# Patient Record
Sex: Female | Born: 1940
Health system: Southern US, Community
[De-identification: ages and names within clinical notes are randomized; demographics above are authoritative.]

## PROBLEM LIST (undated history)

## (undated) DIAGNOSIS — G629 Polyneuropathy, unspecified: Secondary | ICD-10-CM

## (undated) DIAGNOSIS — M1990 Unspecified osteoarthritis, unspecified site: Secondary | ICD-10-CM

## (undated) DIAGNOSIS — N189 Chronic kidney disease, unspecified: Secondary | ICD-10-CM

## (undated) DIAGNOSIS — R112 Nausea with vomiting, unspecified: Secondary | ICD-10-CM

## (undated) DIAGNOSIS — Z9889 Other specified postprocedural states: Secondary | ICD-10-CM

## (undated) DIAGNOSIS — A770 Spotted fever due to Rickettsia rickettsii: Secondary | ICD-10-CM

## (undated) DIAGNOSIS — R3129 Other microscopic hematuria: Secondary | ICD-10-CM

## (undated) DIAGNOSIS — E785 Hyperlipidemia, unspecified: Secondary | ICD-10-CM

## (undated) DIAGNOSIS — I6529 Occlusion and stenosis of unspecified carotid artery: Secondary | ICD-10-CM

## (undated) DIAGNOSIS — C4491 Basal cell carcinoma of skin, unspecified: Secondary | ICD-10-CM

## (undated) DIAGNOSIS — F419 Anxiety disorder, unspecified: Secondary | ICD-10-CM

## (undated) DIAGNOSIS — J189 Pneumonia, unspecified organism: Secondary | ICD-10-CM

## (undated) DIAGNOSIS — R0600 Dyspnea, unspecified: Secondary | ICD-10-CM

## (undated) DIAGNOSIS — K5730 Diverticulosis of large intestine without perforation or abscess without bleeding: Secondary | ICD-10-CM

## (undated) DIAGNOSIS — I2699 Other pulmonary embolism without acute cor pulmonale: Secondary | ICD-10-CM

## (undated) DIAGNOSIS — R011 Cardiac murmur, unspecified: Secondary | ICD-10-CM

## (undated) DIAGNOSIS — B301 Conjunctivitis due to adenovirus: Secondary | ICD-10-CM

## (undated) DIAGNOSIS — N816 Rectocele: Secondary | ICD-10-CM

## (undated) DIAGNOSIS — I10 Essential (primary) hypertension: Secondary | ICD-10-CM

## (undated) DIAGNOSIS — I739 Peripheral vascular disease, unspecified: Secondary | ICD-10-CM

## (undated) DIAGNOSIS — I351 Nonrheumatic aortic (valve) insufficiency: Secondary | ICD-10-CM

## (undated) DIAGNOSIS — E039 Hypothyroidism, unspecified: Secondary | ICD-10-CM

## (undated) HISTORY — DX: Peripheral vascular disease, unspecified: I73.9

## (undated) HISTORY — DX: Pneumonia, unspecified organism: J18.9

## (undated) HISTORY — DX: Spotted fever due to Rickettsia rickettsii: A77.0

## (undated) HISTORY — DX: Other microscopic hematuria: R31.29

## (undated) HISTORY — DX: Unspecified osteoarthritis, unspecified site: M19.90

## (undated) HISTORY — DX: Cardiac murmur, unspecified: R01.1

## (undated) HISTORY — DX: Polyneuropathy, unspecified: G62.9

## (undated) HISTORY — PX: ORTHOPEDIC SURGERY: SHX850

## (undated) HISTORY — DX: Nonrheumatic aortic (valve) insufficiency: I35.1

## (undated) HISTORY — DX: Diverticulosis of large intestine without perforation or abscess without bleeding: K57.30

## (undated) HISTORY — DX: Hypothyroidism, unspecified: E03.9

## (undated) HISTORY — DX: Essential (primary) hypertension: I10

## (undated) HISTORY — DX: Basal cell carcinoma of skin, unspecified: C44.91

## (undated) HISTORY — DX: Hyperlipidemia, unspecified: E78.5

## (undated) HISTORY — DX: Rectocele: N81.6

## (undated) HISTORY — PX: EYE SURGERY: SHX253

## (undated) HISTORY — PX: COLONOSCOPY: SHX174

## (undated) HISTORY — DX: Conjunctivitis due to Adenovirus: B30.1

## (undated) HISTORY — DX: Anxiety disorder, unspecified: F41.9

## (undated) HISTORY — DX: Occlusion and stenosis of unspecified carotid artery: I65.29

---
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
## 1971-06-25 HISTORY — PX: ABDOMINAL HYSTERECTOMY: SHX81

## 1987-06-25 HISTORY — PX: CARPAL TUNNEL RELEASE: SHX101

## 1988-06-24 HISTORY — PX: BILATERAL OOPHORECTOMY: SHX1221

## 1998-09-05 ENCOUNTER — Ambulatory Visit (HOSPITAL_BASED_OUTPATIENT_CLINIC_OR_DEPARTMENT_OTHER): Admission: RE | Admit: 1998-09-05 | Discharge: 1998-09-05 | Payer: Self-pay | Admitting: Orthopedic Surgery

## 2000-03-12 ENCOUNTER — Encounter: Admission: RE | Admit: 2000-03-12 | Discharge: 2000-03-12 | Payer: Self-pay | Admitting: Obstetrics and Gynecology

## 2000-03-12 ENCOUNTER — Encounter: Payer: Self-pay | Admitting: Obstetrics and Gynecology

## 2000-05-07 ENCOUNTER — Ambulatory Visit: Admission: RE | Admit: 2000-05-07 | Discharge: 2000-05-07 | Payer: Self-pay | Admitting: Internal Medicine

## 2001-04-29 ENCOUNTER — Encounter: Payer: Self-pay | Admitting: Internal Medicine

## 2001-04-29 ENCOUNTER — Encounter: Admission: RE | Admit: 2001-04-29 | Discharge: 2001-04-29 | Payer: Self-pay | Admitting: Internal Medicine

## 2002-05-04 ENCOUNTER — Encounter: Admission: RE | Admit: 2002-05-04 | Discharge: 2002-05-04 | Payer: Self-pay | Admitting: Internal Medicine

## 2002-05-04 ENCOUNTER — Encounter: Payer: Self-pay | Admitting: Internal Medicine

## 2002-06-12 ENCOUNTER — Encounter: Payer: Self-pay | Admitting: Orthopedic Surgery

## 2002-06-12 ENCOUNTER — Ambulatory Visit (HOSPITAL_COMMUNITY): Admission: RE | Admit: 2002-06-12 | Discharge: 2002-06-12 | Payer: Self-pay | Admitting: Orthopedic Surgery

## 2003-05-09 ENCOUNTER — Encounter: Admission: RE | Admit: 2003-05-09 | Discharge: 2003-05-09 | Payer: Self-pay | Admitting: Internal Medicine

## 2004-05-03 ENCOUNTER — Ambulatory Visit: Payer: Self-pay | Admitting: Internal Medicine

## 2004-05-09 ENCOUNTER — Encounter: Admission: RE | Admit: 2004-05-09 | Discharge: 2004-05-09 | Payer: Self-pay | Admitting: Obstetrics and Gynecology

## 2004-05-15 ENCOUNTER — Ambulatory Visit: Payer: Self-pay

## 2004-05-18 ENCOUNTER — Ambulatory Visit: Payer: Self-pay | Admitting: Internal Medicine

## 2004-06-05 ENCOUNTER — Ambulatory Visit: Payer: Self-pay | Admitting: Internal Medicine

## 2004-06-24 HISTORY — PX: CHOLECYSTECTOMY: SHX55

## 2004-06-26 ENCOUNTER — Ambulatory Visit: Payer: Self-pay | Admitting: Internal Medicine

## 2004-06-29 ENCOUNTER — Encounter: Admission: RE | Admit: 2004-06-29 | Discharge: 2004-06-29 | Payer: Self-pay | Admitting: Sports Medicine

## 2004-08-08 ENCOUNTER — Ambulatory Visit: Payer: Self-pay | Admitting: Gastroenterology

## 2004-08-09 ENCOUNTER — Ambulatory Visit: Payer: Self-pay | Admitting: Gastroenterology

## 2004-09-05 ENCOUNTER — Ambulatory Visit: Payer: Self-pay | Admitting: Gastroenterology

## 2004-10-22 LAB — HM COLONOSCOPY

## 2004-10-29 ENCOUNTER — Ambulatory Visit: Payer: Self-pay | Admitting: Gastroenterology

## 2004-11-06 ENCOUNTER — Ambulatory Visit: Payer: Self-pay | Admitting: Gastroenterology

## 2004-11-12 ENCOUNTER — Ambulatory Visit (HOSPITAL_COMMUNITY): Admission: RE | Admit: 2004-11-12 | Discharge: 2004-11-12 | Payer: Self-pay | Admitting: Gastroenterology

## 2004-12-11 ENCOUNTER — Encounter (INDEPENDENT_AMBULATORY_CARE_PROVIDER_SITE_OTHER): Payer: Self-pay | Admitting: *Deleted

## 2004-12-11 ENCOUNTER — Observation Stay (HOSPITAL_COMMUNITY): Admission: RE | Admit: 2004-12-11 | Discharge: 2004-12-12 | Payer: Self-pay | Admitting: *Deleted

## 2005-03-06 ENCOUNTER — Ambulatory Visit: Payer: Self-pay | Admitting: Gastroenterology

## 2005-05-07 ENCOUNTER — Ambulatory Visit: Payer: Self-pay | Admitting: Internal Medicine

## 2005-05-13 ENCOUNTER — Encounter: Admission: RE | Admit: 2005-05-13 | Discharge: 2005-05-13 | Payer: Self-pay | Admitting: Internal Medicine

## 2005-05-28 ENCOUNTER — Ambulatory Visit: Payer: Self-pay | Admitting: Internal Medicine

## 2005-06-03 ENCOUNTER — Encounter: Admission: RE | Admit: 2005-06-03 | Discharge: 2005-06-03 | Payer: Self-pay | Admitting: Internal Medicine

## 2005-06-24 DIAGNOSIS — C4491 Basal cell carcinoma of skin, unspecified: Secondary | ICD-10-CM

## 2005-06-24 HISTORY — DX: Basal cell carcinoma of skin, unspecified: C44.91

## 2005-09-02 ENCOUNTER — Ambulatory Visit: Payer: Self-pay | Admitting: Internal Medicine

## 2005-09-04 ENCOUNTER — Ambulatory Visit: Payer: Self-pay | Admitting: Internal Medicine

## 2005-12-04 ENCOUNTER — Ambulatory Visit: Payer: Self-pay | Admitting: Internal Medicine

## 2006-01-21 ENCOUNTER — Ambulatory Visit: Payer: Self-pay | Admitting: Internal Medicine

## 2006-02-11 ENCOUNTER — Ambulatory Visit: Payer: Self-pay | Admitting: Internal Medicine

## 2006-03-13 ENCOUNTER — Ambulatory Visit (HOSPITAL_BASED_OUTPATIENT_CLINIC_OR_DEPARTMENT_OTHER): Admission: RE | Admit: 2006-03-13 | Discharge: 2006-03-14 | Payer: Self-pay | Admitting: Orthopedic Surgery

## 2006-03-24 HISTORY — PX: OTHER SURGICAL HISTORY: SHX169

## 2006-04-24 ENCOUNTER — Encounter (INDEPENDENT_AMBULATORY_CARE_PROVIDER_SITE_OTHER): Payer: Self-pay | Admitting: *Deleted

## 2006-04-24 LAB — CONVERTED CEMR LAB

## 2006-05-14 ENCOUNTER — Encounter: Admission: RE | Admit: 2006-05-14 | Discharge: 2006-05-14 | Payer: Self-pay | Admitting: Obstetrics and Gynecology

## 2006-05-21 ENCOUNTER — Ambulatory Visit: Payer: Self-pay | Admitting: Internal Medicine

## 2006-05-21 LAB — CONVERTED CEMR LAB
ALT: 17 units/L (ref 0–40)
AST: 31 units/L (ref 0–37)
BUN: 10 mg/dL (ref 6–23)
Chol/HDL Ratio, serum: 3
Cholesterol: 211 mg/dL (ref 0–200)
Creatinine, Ser: 1.3 mg/dL — ABNORMAL HIGH (ref 0.4–1.2)
HDL: 71.2 mg/dL (ref >39.0)
Hgb A1c MFr Bld: 5.4 % (ref 4.6–6.0)
LDL DIRECT: 133.1 mg/dL
Potassium: 4 meq/L (ref 3.5–5.1)
TSH: 1.51 microintl units/mL (ref 0.35–5.50)
Triglyceride fasting, serum: 77 mg/dL (ref 0–149)
VLDL: 15 mg/dL (ref 0–40)

## 2006-08-10 DIAGNOSIS — K5730 Diverticulosis of large intestine without perforation or abscess without bleeding: Secondary | ICD-10-CM | POA: Insufficient documentation

## 2006-09-26 ENCOUNTER — Ambulatory Visit: Payer: Self-pay | Admitting: Vascular Surgery

## 2006-11-18 DIAGNOSIS — N6019 Diffuse cystic mastopathy of unspecified breast: Secondary | ICD-10-CM | POA: Insufficient documentation

## 2006-12-08 ENCOUNTER — Ambulatory Visit: Payer: Self-pay | Admitting: Internal Medicine

## 2006-12-08 DIAGNOSIS — T887XXA Unspecified adverse effect of drug or medicament, initial encounter: Secondary | ICD-10-CM | POA: Insufficient documentation

## 2006-12-08 LAB — CONVERTED CEMR LAB
ALT: 18 units/L (ref 0–40)
AST: 26 units/L (ref 0–37)
BUN: 15 mg/dL (ref 6–23)
Cholesterol, target level: 200 mg/dL
Cholesterol: 213 mg/dL (ref 0–200)
Creatinine, Ser: 1.1 mg/dL (ref 0.4–1.2)
Direct LDL: 116.4 mg/dL
HDL goal, serum: 40 mg/dL
HDL: 71.6 mg/dL (ref >39.0)
LDL Goal: 160 mg/dL
Potassium: 3.8 meq/L (ref 3.5–5.1)
TSH: 0.56 microintl units/mL (ref 0.35–5.50)
Total CHOL/HDL Ratio: 3
Triglycerides: 76 mg/dL (ref 0–149)
VLDL: 15 mg/dL (ref 0–40)

## 2007-04-01 ENCOUNTER — Ambulatory Visit: Payer: Self-pay | Admitting: Vascular Surgery

## 2007-04-09 ENCOUNTER — Telehealth (INDEPENDENT_AMBULATORY_CARE_PROVIDER_SITE_OTHER): Payer: Self-pay | Admitting: *Deleted

## 2007-05-08 ENCOUNTER — Telehealth: Payer: Self-pay | Admitting: Internal Medicine

## 2007-05-28 ENCOUNTER — Encounter: Admission: RE | Admit: 2007-05-28 | Discharge: 2007-05-28 | Payer: Self-pay | Admitting: Internal Medicine

## 2007-06-27 ENCOUNTER — Encounter: Admission: RE | Admit: 2007-06-27 | Discharge: 2007-06-27 | Payer: Self-pay

## 2007-07-08 ENCOUNTER — Encounter: Admission: RE | Admit: 2007-07-08 | Discharge: 2007-07-08 | Payer: Self-pay

## 2007-08-11 ENCOUNTER — Encounter (INDEPENDENT_AMBULATORY_CARE_PROVIDER_SITE_OTHER): Payer: Self-pay | Admitting: *Deleted

## 2007-08-11 DIAGNOSIS — M1990 Unspecified osteoarthritis, unspecified site: Secondary | ICD-10-CM | POA: Insufficient documentation

## 2007-08-11 DIAGNOSIS — Z85828 Personal history of other malignant neoplasm of skin: Secondary | ICD-10-CM | POA: Insufficient documentation

## 2007-08-11 DIAGNOSIS — I6529 Occlusion and stenosis of unspecified carotid artery: Secondary | ICD-10-CM | POA: Insufficient documentation

## 2007-08-11 DIAGNOSIS — F329 Major depressive disorder, single episode, unspecified: Secondary | ICD-10-CM | POA: Insufficient documentation

## 2007-08-11 DIAGNOSIS — F3289 Other specified depressive episodes: Secondary | ICD-10-CM | POA: Insufficient documentation

## 2007-08-12 ENCOUNTER — Ambulatory Visit: Payer: Self-pay | Admitting: Internal Medicine

## 2007-08-12 DIAGNOSIS — I10 Essential (primary) hypertension: Secondary | ICD-10-CM | POA: Insufficient documentation

## 2007-08-12 DIAGNOSIS — E782 Mixed hyperlipidemia: Secondary | ICD-10-CM

## 2007-08-12 DIAGNOSIS — M8580 Other specified disorders of bone density and structure, unspecified site: Secondary | ICD-10-CM | POA: Insufficient documentation

## 2007-08-12 DIAGNOSIS — R319 Hematuria, unspecified: Secondary | ICD-10-CM | POA: Insufficient documentation

## 2007-08-12 DIAGNOSIS — E039 Hypothyroidism, unspecified: Secondary | ICD-10-CM | POA: Insufficient documentation

## 2007-08-12 DIAGNOSIS — E785 Hyperlipidemia, unspecified: Secondary | ICD-10-CM | POA: Insufficient documentation

## 2007-08-12 LAB — CONVERTED CEMR LAB: LDL Goal: 100 mg/dL

## 2007-08-13 ENCOUNTER — Encounter (INDEPENDENT_AMBULATORY_CARE_PROVIDER_SITE_OTHER): Payer: Self-pay | Admitting: *Deleted

## 2007-08-13 LAB — CONVERTED CEMR LAB
ALT: 16 units/L (ref 0–35)
AST: 31 units/L (ref 0–37)
Albumin: 3.7 g/dL (ref 3.5–5.2)
Alkaline Phosphatase: 58 units/L (ref 39–117)
BUN: 13 mg/dL (ref 6–23)
Basophils Absolute: 0 10*3/uL (ref 0.0–0.1)
Basophils Relative: 0.6 % (ref 0.0–1.0)
Bilirubin, Direct: 0.1 mg/dL (ref 0.0–0.3)
CO2: 32 meq/L (ref 19–32)
Calcium: 9.7 mg/dL (ref 8.4–10.5)
Chloride: 105 meq/L (ref 96–112)
Cholesterol: 182 mg/dL (ref 0–200)
Creatinine, Ser: 1.2 mg/dL (ref 0.4–1.2)
Eosinophils Absolute: 0.2 10*3/uL (ref 0.0–0.6)
Eosinophils Relative: 3.8 % (ref 0.0–5.0)
GFR calc Af Amer: 57 mL/min
GFR calc non Af Amer: 47 mL/min
Glucose, Bld: 93 mg/dL (ref 70–99)
HCT: 41.6 % (ref 36.0–46.0)
HDL: 61.2 mg/dL (ref >39.0)
Hemoglobin: 13.7 g/dL (ref 12.0–15.0)
LDL Cholesterol: 97 mg/dL (ref 0–99)
Lymphocytes Relative: 26 % (ref 12.0–46.0)
MCHC: 32.8 g/dL (ref 30.0–36.0)
MCV: 92.4 fL (ref 78.0–100.0)
Monocytes Absolute: 0.7 10*3/uL (ref 0.2–0.7)
Monocytes Relative: 10.9 % (ref 3.0–11.0)
Neutro Abs: 3.5 10*3/uL (ref 1.4–7.7)
Neutrophils Relative %: 58.7 % (ref 43.0–77.0)
Platelets: 226 10*3/uL (ref 150–400)
Potassium: 4.3 meq/L (ref 3.5–5.1)
RBC: 4.5 M/uL (ref 3.87–5.11)
RDW: 12.6 % (ref 11.5–14.6)
Sodium: 142 meq/L (ref 135–145)
TSH: 0.96 microintl units/mL (ref 0.35–5.50)
Total Bilirubin: 0.8 mg/dL (ref 0.3–1.2)
Total CHOL/HDL Ratio: 3
Total Protein: 6.5 g/dL (ref 6.0–8.3)
Triglycerides: 118 mg/dL (ref 0–149)
VLDL: 24 mg/dL (ref 0–40)
WBC: 6 10*3/uL (ref 4.5–10.5)

## 2007-08-14 ENCOUNTER — Encounter (INDEPENDENT_AMBULATORY_CARE_PROVIDER_SITE_OTHER): Payer: Self-pay | Admitting: *Deleted

## 2007-08-17 ENCOUNTER — Encounter (INDEPENDENT_AMBULATORY_CARE_PROVIDER_SITE_OTHER): Payer: Self-pay | Admitting: *Deleted

## 2007-08-17 ENCOUNTER — Ambulatory Visit: Payer: Self-pay | Admitting: Internal Medicine

## 2007-08-17 LAB — CONVERTED CEMR LAB: Vit D, 1,25-Dihydroxy: 46 (ref 30–89)

## 2007-08-18 ENCOUNTER — Encounter: Payer: Self-pay | Admitting: Internal Medicine

## 2007-08-18 ENCOUNTER — Encounter (INDEPENDENT_AMBULATORY_CARE_PROVIDER_SITE_OTHER): Payer: Self-pay | Admitting: *Deleted

## 2007-08-20 ENCOUNTER — Encounter: Payer: Self-pay | Admitting: Internal Medicine

## 2007-09-30 ENCOUNTER — Ambulatory Visit: Payer: Self-pay | Admitting: Vascular Surgery

## 2008-04-12 ENCOUNTER — Telehealth (INDEPENDENT_AMBULATORY_CARE_PROVIDER_SITE_OTHER): Payer: Self-pay | Admitting: *Deleted

## 2008-04-13 ENCOUNTER — Ambulatory Visit: Payer: Self-pay | Admitting: Vascular Surgery

## 2008-05-02 ENCOUNTER — Ambulatory Visit: Payer: Self-pay | Admitting: Internal Medicine

## 2008-05-06 ENCOUNTER — Telehealth (INDEPENDENT_AMBULATORY_CARE_PROVIDER_SITE_OTHER): Payer: Self-pay | Admitting: *Deleted

## 2008-05-13 ENCOUNTER — Telehealth (INDEPENDENT_AMBULATORY_CARE_PROVIDER_SITE_OTHER): Payer: Self-pay | Admitting: *Deleted

## 2008-05-16 ENCOUNTER — Telehealth (INDEPENDENT_AMBULATORY_CARE_PROVIDER_SITE_OTHER): Payer: Self-pay | Admitting: *Deleted

## 2008-05-30 ENCOUNTER — Encounter: Admission: RE | Admit: 2008-05-30 | Discharge: 2008-05-30 | Payer: Self-pay | Admitting: Internal Medicine

## 2008-06-06 ENCOUNTER — Encounter: Payer: Self-pay | Admitting: Internal Medicine

## 2008-06-07 ENCOUNTER — Telehealth (INDEPENDENT_AMBULATORY_CARE_PROVIDER_SITE_OTHER): Payer: Self-pay | Admitting: *Deleted

## 2008-06-13 ENCOUNTER — Encounter: Payer: Self-pay | Admitting: Internal Medicine

## 2008-06-13 HISTORY — PX: NM MYOVIEW LTD: HXRAD82

## 2008-06-14 ENCOUNTER — Encounter: Payer: Self-pay | Admitting: Internal Medicine

## 2008-06-24 HISTORY — PX: CATARACT EXTRACTION, BILATERAL: SHX1313

## 2008-08-15 ENCOUNTER — Ambulatory Visit: Payer: Self-pay | Admitting: Internal Medicine

## 2008-08-15 DIAGNOSIS — G609 Hereditary and idiopathic neuropathy, unspecified: Secondary | ICD-10-CM | POA: Insufficient documentation

## 2008-08-28 LAB — CONVERTED CEMR LAB
ALT: 21 units/L (ref 0–35)
AST: 35 units/L (ref 0–37)
Albumin: 3.8 g/dL (ref 3.5–5.2)
Alkaline Phosphatase: 68 units/L (ref 39–117)
BUN: 15 mg/dL (ref 6–23)
Basophils Absolute: 0 10*3/uL (ref 0.0–0.1)
Basophils Relative: 0.2 % (ref 0.0–3.0)
Bilirubin, Direct: 0.1 mg/dL (ref 0.0–0.3)
CO2: 33 meq/L — ABNORMAL HIGH (ref 19–32)
Calcium: 9.7 mg/dL (ref 8.4–10.5)
Chloride: 103 meq/L (ref 96–112)
Cholesterol: 193 mg/dL (ref 0–200)
Creatinine, Ser: 1.3 mg/dL — ABNORMAL HIGH (ref 0.4–1.2)
Eosinophils Absolute: 0.7 10*3/uL (ref 0.0–0.7)
Eosinophils Relative: 8.1 % — ABNORMAL HIGH (ref 0.0–5.0)
GFR calc Af Amer: 52 mL/min
GFR calc non Af Amer: 43 mL/min
Glucose, Bld: 86 mg/dL (ref 70–99)
HCT: 41.9 % (ref 36.0–46.0)
HDL: 70.6 mg/dL (ref >39.0)
Hemoglobin: 14.3 g/dL (ref 12.0–15.0)
LDL Cholesterol: 107 mg/dL — ABNORMAL HIGH (ref 0–99)
Lymphocytes Relative: 21.9 % (ref 12.0–46.0)
MCHC: 34.3 g/dL (ref 30.0–36.0)
MCV: 94 fL (ref 78.0–100.0)
Monocytes Absolute: 0.9 10*3/uL (ref 0.1–1.0)
Monocytes Relative: 11.1 % (ref 3.0–12.0)
Neutro Abs: 4.7 10*3/uL (ref 1.4–7.7)
Neutrophils Relative %: 58.7 % (ref 43.0–77.0)
Platelets: 220 10*3/uL (ref 150–400)
Potassium: 3.9 meq/L (ref 3.5–5.1)
RBC: 4.46 M/uL (ref 3.87–5.11)
RDW: 12.5 % (ref 11.5–14.6)
Sodium: 144 meq/L (ref 135–145)
TSH: 2.81 microintl units/mL (ref 0.35–5.50)
Total Bilirubin: 0.9 mg/dL (ref 0.3–1.2)
Total CHOL/HDL Ratio: 2.7
Total Protein: 6.8 g/dL (ref 6.0–8.3)
Triglycerides: 76 mg/dL (ref 0–149)
VLDL: 15 mg/dL (ref 0–40)
Vit D, 25-Hydroxy: 47 ng/mL (ref 30–89)
WBC: 8.1 10*3/uL (ref 4.5–10.5)

## 2008-08-29 ENCOUNTER — Encounter (INDEPENDENT_AMBULATORY_CARE_PROVIDER_SITE_OTHER): Payer: Self-pay | Admitting: *Deleted

## 2008-10-03 ENCOUNTER — Ambulatory Visit: Payer: Self-pay | Admitting: Vascular Surgery

## 2009-03-29 ENCOUNTER — Ambulatory Visit: Payer: Self-pay | Admitting: Vascular Surgery

## 2009-04-07 ENCOUNTER — Encounter: Payer: Self-pay | Admitting: Internal Medicine

## 2009-04-07 ENCOUNTER — Ambulatory Visit: Payer: Self-pay | Admitting: Family Medicine

## 2009-05-01 ENCOUNTER — Ambulatory Visit: Payer: Self-pay | Admitting: Internal Medicine

## 2009-05-01 DIAGNOSIS — IMO0001 Reserved for inherently not codable concepts without codable children: Secondary | ICD-10-CM | POA: Insufficient documentation

## 2009-05-02 LAB — CONVERTED CEMR LAB: Vit D, 25-Hydroxy: 50 ng/mL (ref 30–89)

## 2009-05-03 ENCOUNTER — Telehealth (INDEPENDENT_AMBULATORY_CARE_PROVIDER_SITE_OTHER): Payer: Self-pay | Admitting: *Deleted

## 2009-05-04 ENCOUNTER — Encounter (INDEPENDENT_AMBULATORY_CARE_PROVIDER_SITE_OTHER): Payer: Self-pay | Admitting: *Deleted

## 2009-05-04 LAB — CONVERTED CEMR LAB
TSH: 2.47 microintl units/mL (ref 0.35–5.50)
Total CK: 543 units/L — ABNORMAL HIGH (ref 7–177)

## 2009-05-12 ENCOUNTER — Ambulatory Visit: Payer: Self-pay | Admitting: Internal Medicine

## 2009-05-13 LAB — CONVERTED CEMR LAB: Total CK: 584 units/L — ABNORMAL HIGH (ref 7–177)

## 2009-05-16 ENCOUNTER — Telehealth (INDEPENDENT_AMBULATORY_CARE_PROVIDER_SITE_OTHER): Payer: Self-pay | Admitting: *Deleted

## 2009-05-31 ENCOUNTER — Encounter: Admission: RE | Admit: 2009-05-31 | Discharge: 2009-05-31 | Payer: Self-pay | Admitting: Obstetrics and Gynecology

## 2009-08-16 ENCOUNTER — Ambulatory Visit: Payer: Self-pay | Admitting: Internal Medicine

## 2009-09-20 ENCOUNTER — Encounter: Payer: Self-pay | Admitting: Internal Medicine

## 2009-10-18 ENCOUNTER — Encounter: Payer: Self-pay | Admitting: Internal Medicine

## 2009-10-27 ENCOUNTER — Encounter (INDEPENDENT_AMBULATORY_CARE_PROVIDER_SITE_OTHER): Payer: Self-pay | Admitting: *Deleted

## 2009-11-03 ENCOUNTER — Telehealth (INDEPENDENT_AMBULATORY_CARE_PROVIDER_SITE_OTHER): Payer: Self-pay | Admitting: *Deleted

## 2009-11-13 ENCOUNTER — Ambulatory Visit: Payer: Self-pay | Admitting: Internal Medicine

## 2009-11-13 LAB — CONVERTED CEMR LAB: LDL Goal: 120 mg/dL

## 2009-12-13 ENCOUNTER — Telehealth (INDEPENDENT_AMBULATORY_CARE_PROVIDER_SITE_OTHER): Payer: Self-pay | Admitting: *Deleted

## 2009-12-15 ENCOUNTER — Telehealth: Payer: Self-pay | Admitting: Internal Medicine

## 2010-01-01 ENCOUNTER — Ambulatory Visit: Payer: Self-pay | Admitting: Internal Medicine

## 2010-02-21 ENCOUNTER — Encounter: Payer: Self-pay | Admitting: Internal Medicine

## 2010-03-05 ENCOUNTER — Ambulatory Visit: Payer: Self-pay | Admitting: Internal Medicine

## 2010-03-27 ENCOUNTER — Telehealth (INDEPENDENT_AMBULATORY_CARE_PROVIDER_SITE_OTHER): Payer: Self-pay | Admitting: *Deleted

## 2010-04-03 ENCOUNTER — Ambulatory Visit: Payer: Self-pay | Admitting: Internal Medicine

## 2010-04-05 ENCOUNTER — Encounter: Payer: Self-pay | Admitting: Internal Medicine

## 2010-04-05 ENCOUNTER — Ambulatory Visit: Payer: Self-pay | Admitting: Vascular Surgery

## 2010-04-06 LAB — CONVERTED CEMR LAB
ALT: 21 units/L (ref 0–35)
AST: 34 units/L (ref 0–37)
Albumin: 3.6 g/dL (ref 3.5–5.2)
Alkaline Phosphatase: 64 units/L (ref 39–117)
Bilirubin, Direct: 0.1 mg/dL (ref 0.0–0.3)
Cholesterol: 182 mg/dL (ref 0–200)
HDL: 63.2 mg/dL (ref >39.00)
LDL Cholesterol: 108 mg/dL — ABNORMAL HIGH (ref 0–99)
Total Bilirubin: 0.8 mg/dL (ref 0.3–1.2)
Total CHOL/HDL Ratio: 3
Total CK: 588 units/L — ABNORMAL HIGH (ref 7–177)
Total Protein: 6.2 g/dL (ref 6.0–8.3)
Triglycerides: 56 mg/dL (ref 0.0–149.0)
VLDL: 11.2 mg/dL (ref 0.0–40.0)

## 2010-04-10 ENCOUNTER — Ambulatory Visit: Payer: Self-pay | Admitting: Internal Medicine

## 2010-04-16 ENCOUNTER — Encounter: Payer: Self-pay | Admitting: Internal Medicine

## 2010-05-01 ENCOUNTER — Encounter: Payer: Self-pay | Admitting: Internal Medicine

## 2010-05-01 HISTORY — PX: US ECHOCARDIOGRAPHY: HXRAD669

## 2010-05-15 ENCOUNTER — Telehealth (INDEPENDENT_AMBULATORY_CARE_PROVIDER_SITE_OTHER): Payer: Self-pay | Admitting: *Deleted

## 2010-06-01 ENCOUNTER — Encounter
Admission: RE | Admit: 2010-06-01 | Discharge: 2010-06-01 | Payer: Self-pay | Source: Home / Self Care | Attending: Internal Medicine | Admitting: Internal Medicine

## 2010-06-01 LAB — HM MAMMOGRAPHY: HM Mammogram: NEGATIVE

## 2010-06-05 ENCOUNTER — Ambulatory Visit: Payer: Self-pay | Admitting: Internal Medicine

## 2010-06-06 ENCOUNTER — Encounter: Payer: Self-pay | Admitting: Internal Medicine

## 2010-06-13 ENCOUNTER — Telehealth (INDEPENDENT_AMBULATORY_CARE_PROVIDER_SITE_OTHER): Payer: Self-pay | Admitting: *Deleted

## 2010-06-13 LAB — CONVERTED CEMR LAB
Cholesterol: 221 mg/dL — ABNORMAL HIGH (ref 0–200)
Direct LDL: 146.2 mg/dL
HDL: 50.6 mg/dL (ref >39.00)
Total CHOL/HDL Ratio: 4
Total CK: 373 units/L — ABNORMAL HIGH (ref 7–177)
Triglycerides: 81 mg/dL (ref 0.0–149.0)
VLDL: 16.2 mg/dL (ref 0.0–40.0)

## 2010-06-19 ENCOUNTER — Encounter: Payer: Self-pay | Admitting: Internal Medicine

## 2010-07-03 ENCOUNTER — Encounter: Payer: Self-pay | Admitting: Internal Medicine

## 2010-07-05 ENCOUNTER — Telehealth (INDEPENDENT_AMBULATORY_CARE_PROVIDER_SITE_OTHER): Payer: Self-pay | Admitting: *Deleted

## 2010-07-06 ENCOUNTER — Ambulatory Visit
Admission: RE | Admit: 2010-07-06 | Discharge: 2010-07-06 | Payer: Self-pay | Source: Home / Self Care | Attending: Internal Medicine | Admitting: Internal Medicine

## 2010-07-09 ENCOUNTER — Encounter: Payer: Self-pay | Admitting: Internal Medicine

## 2010-07-11 ENCOUNTER — Encounter: Payer: Self-pay | Admitting: Internal Medicine

## 2010-07-11 ENCOUNTER — Ambulatory Visit
Admission: RE | Admit: 2010-07-11 | Discharge: 2010-07-11 | Payer: Self-pay | Source: Home / Self Care | Attending: Internal Medicine | Admitting: Internal Medicine

## 2010-07-14 ENCOUNTER — Encounter: Payer: Self-pay | Admitting: Internal Medicine

## 2010-07-15 ENCOUNTER — Encounter: Payer: Self-pay | Admitting: Gastroenterology

## 2010-07-15 ENCOUNTER — Encounter: Payer: Self-pay | Admitting: Internal Medicine

## 2010-07-20 ENCOUNTER — Encounter: Payer: Self-pay | Admitting: Internal Medicine

## 2010-07-22 LAB — CONVERTED CEMR LAB
ALT: 19 units/L (ref 0–35)
AST: 28 units/L (ref 0–37)
Albumin: 3.7 g/dL (ref 3.5–5.2)
Alkaline Phosphatase: 78 units/L (ref 39–117)
BUN: 20 mg/dL (ref 6–23)
Basophils Absolute: 0.2 10*3/uL — ABNORMAL HIGH (ref 0.0–0.1)
Basophils Relative: 2.3 % (ref 0.0–3.0)
Bilirubin, Direct: 0.1 mg/dL (ref 0.0–0.3)
CO2: 30 meq/L (ref 19–32)
Calcium: 9.6 mg/dL (ref 8.4–10.5)
Chloride: 106 meq/L (ref 96–112)
Creatinine, Ser: 1.2 mg/dL (ref 0.4–1.2)
Eosinophils Absolute: 0.5 10*3/uL (ref 0.0–0.7)
Eosinophils Relative: 6.9 % — ABNORMAL HIGH (ref 0.0–5.0)
GFR calc non Af Amer: 47.09 mL/min (ref >60)
Glucose, Bld: 89 mg/dL (ref 70–99)
HCT: 42.6 % (ref 36.0–46.0)
Hemoglobin: 13.8 g/dL (ref 12.0–15.0)
Lymphocytes Relative: 20.7 % (ref 12.0–46.0)
Lymphs Abs: 1.5 10*3/uL (ref 0.7–4.0)
MCHC: 32.3 g/dL (ref 30.0–36.0)
MCV: 95.3 fL (ref 78.0–100.0)
Magnesium: 2.3 mg/dL (ref 1.5–2.5)
Monocytes Absolute: 0.6 10*3/uL (ref 0.1–1.0)
Monocytes Relative: 9 % (ref 3.0–12.0)
Neutro Abs: 4.3 10*3/uL (ref 1.4–7.7)
Neutrophils Relative %: 61.1 % (ref 43.0–77.0)
Platelets: 234 10*3/uL (ref 150.0–400.0)
Potassium: 4.8 meq/L (ref 3.5–5.1)
RBC: 4.47 M/uL (ref 3.87–5.11)
RDW: 12.2 % (ref 11.5–14.6)
Sed Rate: 19 mm/hr (ref 0–22)
Sodium: 143 meq/L (ref 135–145)
TSH: 3.53 microintl units/mL (ref 0.35–5.50)
Total Bilirubin: 0.6 mg/dL (ref 0.3–1.2)
Total CK: 230 units/L — ABNORMAL HIGH (ref 7–177)
Total Protein: 6.9 g/dL (ref 6.0–8.3)
WBC: 7.1 10*3/uL (ref 4.5–10.5)

## 2010-07-24 NOTE — Progress Notes (Signed)
Summary: FYI RE;  SAMPLES  Phone Note Call from Patient Call back at [facility] Phone [phone]   Reason for Call: Talk to Nurse Summary of Call: DR Shloma Roggenkamp// PT NEEDS NURSE TO [place]. PT CAN NOT TAKE GENERIC ONLY BRAND NAME  BECAUSE IT CAUSED HER A COUGH AND NOT [place]. PT HANDLES THE BRAND NAME. [facility] [phone] AND FAX [phone] Initial call taken by: Charlcie Cradle,  May 08, 2007 10:26 AM  Follow-up for Phone Call        cyris, prior authorizations all go to alida could you forward this to her, thanks Follow-up by: Janelle Floor,  May 08, 2007 11:13 AM  Additional Follow-up for Phone Call Additional follow up Details #1::        LEFT MSG ON MACHINE TO CALL (need more info ref to request, ins or pharmacist usually sendsrequest for prior auth) Additional Follow-up by: Verdie Mosher,  May 08, 2007 3:19 PM    Additional Follow-up for Phone Call Additional follow up Details #2::    spoke with pt said dont have to bother with them they said it is a tier 2 rather than tier 3 so they wont do that anymore , but right now in a donut hole for benicar until january, wondering if dr Decari Duggar can give me samples until then have a cpx in feb 18 at 8:30 will be out of benicar in a few days need about 25 pills  to last until can get rx said cannot pay $228.59 for this. use benicar 40 mg, cannot use other med was on benazepril makes me cough says dr Areonna Bran knows about it.  Informed pt will have to ask dr Manoah Deckard abput samples and will call back. callback [phone] leave on ans machine if not in Follow-up by: Verdie Mosher,  May 12, 2007 11:53 AM  Additional Follow-up for Phone Call Additional follow up Details #3:: Details for Additional Follow-up Action Taken: Give her  98month of [place]; but she needs to contact her insurance company as to which Angiotensin Receptor Blocker they cover as "preferred". She had a  cough on ACE -I. They should have 1 agent which least expensive; again she can't take ACE -I s  HOP PT INFORMED SAMPLES UP FRONT SHE DID LOOK IN BOOK DIDNT SEE ANYTHING, BUT WILL BRING BOOK WHEN I COME  AND WE WILL DISCUSSS  Additional Follow-up by: Unice Cobble MD,  May 12, 2007 1:13 PM

## 2010-07-24 NOTE — Progress Notes (Signed)
Summary: H1N1  Phone Note Call from Patient Call back at Home Phone [phone]   Caller: Patient Reason for Call: Talk to Nurse Summary of Call: Dr. Linna Darner Patient wanted to know if she should have the H1N1 injections. Initial call taken by: Larene Pickett,  April 12, 2008 8:16 AM  Follow-up for Phone Call        LEFT MESSAGE ON MACHINE-GAVE RECOMMENDATIONS FOR H1N1 VACCINE AND NUMBER TO [place] AND [facility] Follow-up by: Georgette Dover,  April 12, 2008 12:11 PM

## 2010-07-24 NOTE — Progress Notes (Signed)
Summary: cpx pending [id number] hopper  Phone Note Call from Patient   Summary of Call: fyi --- cpx scheduled [id number] Initial call taken by: Arbie Cookey Spring,  April 09, 2007 11:01 AM

## 2010-07-24 NOTE — Letter (Signed)
Summary: Results Follow up Letter  [facility] [place]   [place], [place] [address]   Phone: [phone]  Fax: [phone]    08/29/2008 MRN: [id number]  Deborah Shields [address] [place], [place]  [address]  Dear Deborah Shields,  The following are the results of your recent test(s):  Test         Result    Pap Smear:        Normal _____  Not Normal _____ Comments: ______________________________________________________ Cholesterol: LDL(Bad cholesterol):         Your goal is less than:         HDL (Good cholesterol):       Your goal is more than: Comments:  ______________________________________________________ Mammogram:        Normal _____  Not Normal _____ Comments:  ___________________________________________________________________ Hemoccult:        Normal _____  Not normal _______ Comments:    _____________________________________________________________________ Other Tests: PLEASE SEE ATTACHED LABS DONE ON 08/15/2008    We routinely do not discuss normal results over the telephone.  If you desire a copy of the results, or you have any questions about this information we can discuss them at your next office visit.   Sincerely,

## 2010-07-24 NOTE — Letter (Signed)
Summary: [facility]   Imported By: Velora Heckler 08/27/2007 09:49:29  _____________________________________________________________________  External Attachment:    Type:   Image     Comment:   External Document

## 2010-07-24 NOTE — Progress Notes (Signed)
Summary: Triage: Zetia Concerns (reaction)  Phone Note Call from Patient Call back at Home Phone [phone]   Caller: Patient Summary of Call: Message left on VM, Patient said she can no longer take Zetia because it is tearing her stomach up. Patient said she is unable to control her Bowels, patient with diarrhea x 1 week. Patient said she needs another med in place of Zetia and would like it sent to [facility]  December 15, 2009 7:54 AM   Follow-up for Phone Call        she will need to make appt with list of meds she can't take besides Lovastatin & Zetia. We'll discuss options which are limited & give samples to assess response Follow-up by: Unice Cobble MD,  December 15, 2009 12:03 PM  Additional Follow-up for Phone Call Additional follow up Details #1::        Pt notified of MD instructions via VM. Told pt to call office back and schedule appointment. Additional Follow-up by: Geanie Kenning,  December 15, 2009 12:51 PM

## 2010-07-24 NOTE — Assessment & Plan Note (Signed)
Summary: cpx& lab,cbs   Vital Signs:  Patient Profile:   70 Years Old Female Weight:      126.38 pounds Pulse rate:   64 / minute Pulse rhythm:   regular Resp:     17 per minute BP sitting:   118 / 78  (left arm) Cuff size:   large  Pt. in pain?   no  Vitals Entered By: Janelle Floor (August 12, 2007 8:28 AM)                  Chief Complaint:  MED REFILL.  History of Present Illness: Issues addressed : lipids, HTN,thyroid,depression, & osteoporosis.(see Management Panels) Walking has decreased ssince death of granddaughte of cancer.  Depression History:      The patient denies significant weight loss, significant weight gain, insomnia, hypersomnia, psychomotor agitation, psychomotor retardation, fatigue (loss of energy), feelings of worthlessness (guilt), impaired concentration (indecisiveness), and recurrent thoughts of death or suicide.        Psychosocial stress factors include the recent death of a loved one.  Risk factors for depression include a personal history of depression and a recent loss.  The patient denies that she feels like life is not worth living, denies that she wishes that she were dead, and denies that she has thought about ending her life.         Hypertension History:      She complains of neurologic problems, but denies headache, chest pain, palpitations, dyspnea with exertion, orthopnea, PND, peripheral edema, visual symptoms, syncope, and side effects from treatment.  She notes no problems with any antihypertensive medication side effects.  Further comments include: BP @ home essen same. No epistaxis. Some inner ear balance issues & numbness of fingers related to arthritis.        Positive major cardiovascular risk factors include female age 45 years old or older, hyperlipidemia, hypertension, and family history for ischemic heart disease (males less than 40 years old).  Negative major cardiovascular risk factors include no history of diabetes and  non-tobacco-user status.        Positive history for target organ damage include peripheral vascular disease.  Further assessment for target organ damage reveals no history of ASHD or stroke/TIA.    Lipid Management History:      Positive NCEP/ATP III risk factors include female age 85 years old or older, family history for ischemic heart disease (males less than 62 years old), hypertension, and peripheral vascular disease.  Negative NCEP/ATP III risk factors include no history of early menopause without estrogen hormone replacement, non-diabetic, HDL cholesterol greater than 60, non-tobacco-user status, no ASHD (atherosclerotic heart disease), no prior stroke/TIA, and no history of aortic aneurysm.         Current Allergies (reviewed today): ! PENICILLIN ! [facility] ! PAXIL ! ASA ! LOVASTATIN ! CODEINE ! * BENAZEPRIL  Past Medical History:    Reviewed history from 11/18/2006 and no changes required:       Diverticulosis, colon       Hypothyroidism       comprehensive neuropathy       Hyperlipidemia       Hypertension       microscopic hematuria       family history coronary artery disease       hypercholesterolemia       basal cell carcinoma       degenerative joint disease       depression       peripheral vascular  disease       adverse effect of medication  Past Surgical History:    Reviewed history from 08/11/2007 and no changes required:       Hysterectomy age 35 due to dysfunctional menses; HRT X 25-30 years       BSO as sister had ovarian CA       Appendectomy       Cholecystectomy       Tonsillectomy       Sx. x 4 (elbows, right shoulder, right hand)       Right carpal tunnel (1989)       Toe nail removed       Bunionectomy right foot       Shoulder sx. (2000, 2002, 2007)       Colonoscopy- tics (2001),  Colonoscopy & Endoscopy (2006)       Basal LLE (03/2006)   Family History:    Reviewed history from 08/11/2007 and no changes required:       Father: MI  age 54       Mother: Colon CA,  CVA, nephrectomy ? reason (died in 50's)       Siblings: 67 sisters--1 w/ovarian CA, kidney disease (died)                                      1 w/AV valve, aortic root                                      1 w/MV replacement                     6 brothers--2 w/CAD, both deceased                                         1 died @ early age of heart problems                                         2 w/AV replacement                                         1 w/ AV disease                                         1 w/alcoholism, liver disease secondary to this  Social History:    Reviewed history from 11/18/2006 and no changes required:       Never Smoked       Alcohol use-no       Low cholesterol diet   Risk Factors:  Exercise:  no   Review of Systems  General      Denies chills, fever, sweats, and weight loss.  ENT      Denies nosebleeds.  CV      See HTN & Lipid & Panels  GI      Denies abdominal pain, bloody stools, constipation, dark  tarry stools, diarrhea, indigestion, nausea, and vomiting.  GU      Complains of discharge and urinary frequency.      Denies abnormal vaginal bleeding, hematuria, and incontinence.      R hydronephrosis on LS MRI 06/27/07. ? fullness R pelvocaliceal system. Bloody discharge in pants occa; last Gyn Fall 2007  MS      Complains of low back pain and stiffness.      LBS limits CVE.Central stenosis LS on MR of spine done 06/27/07 by Dr Justine Null. Dr Ree Edman does BMD; on vit D  1000 IU & Ca++ 1200 mg   Derm      Denies changes in color of skin, changes in nail beds, dryness, and hair loss.  Neuro      Denies brief paralysis, headaches, and memory loss.  Psych      Complains of easily tearful.      Denies anxiety, depression, easily angered, and irritability.      See Depression Panel  Endo      Complains of cold intolerance.      Denies excessive hunger, excessive thirst, excessive urination, heat intolerance,  polyuria, and weight change.  Heme      Complains of abnormal bruising.      Denies bleeding.      On Celebrex   Physical Exam  General:     Well-developed,well-nourished,in no acute distress; alert,appropriate and cooperative throughout examination;underweight appearing.   Neck:     No deformities, masses, or tenderness noted.No nodules Lungs:     Normal respiratory effort, chest expands symmetrically. Lungs are clear to auscultation, no crackles or wheezes. Heart:     Normal rate and regular rhythm. S1 and S2 normal without gallop, murmur, click, rub . S4 with slurring. Abdomen:     Bowel sounds positive,abdomen soft and non-tender without masses, organomegaly or hernias noted. No flank tenderness to percussion Msk:     No deformity or scoliosis noted of thoracic or lumbar spine.   Pulses:     R and L carotid,radial and posterior tibial pulses are full and equal bilaterally. Decreased DPP Extremities:     Marked DJD of hands. Crepitus knees. Neg SLR Neurologic:     alert & oriented X3, strength normal in all extremities, gait normal, and DTRs symmetrical and normal.   Skin:     Intact without suspicious lesions or rashes Cervical Nodes:     No lymphadenopathy noted Axillary Nodes:     No palpable lymphadenopathy Psych:     memory intact for recent and remote, normally interactive, good eye contact, not anxious appearing, and not depressed appearing.      Impression & Recommendations:  Problem # 1:  HYPERLIPIDEMIA (B2193296.4)  Her updated medication list for this problem includes:    Pravastatin Sodium 40 Mg Tabs (Pravastatin sodium) .Marland Kitchen... 1 by mouth qd   Problem # 2:  HYPERTENSION (ICD-401.9)  The following medications were removed from the medication list:    Benazepril Hcl 40 Mg Tabs (Benazepril hcl) .Marland Kitchen... Take 1/2 tablet by mouth every day  Her updated medication list for this problem includes:    Lopressor 50 Mg Tabs (Metoprolol tartrate) .Marland Kitchen... 1/2 once  daily    Benicar 40 Mg Tabs (Olmesartan medoxomil) .Marland Kitchen... 1daily  Orders: EKG w/ Interpretation (93000) TLB-BMP (Basic Metabolic Panel-BMET) (99991111)   Problem # 3:  HYPOTHYROIDISM (ICD-244.9)  Her updated medication list for this problem includes:    Levoxyl 88 Mcg Tabs (Levothyroxine sodium) .Marland Kitchen... 1 tab daily  except tuesday and saturdaytake 1/2 tab  Orders: TLB-TSH (Thyroid Stimulating Hormone) (84443-TSH)   Problem # 4:  OSTEOPOROSIS (ICD-733.00)  Her updated medication list for this problem includes:    Fosamax 70 Mg Tabs (Alendronate sodium) .Marland Kitchen... 1 tab weekly  Orders: T-Vitamin D (25-Hydroxy) [id number])   Problem # 5:  ABDOMINAL ULTRASOUND, ABNORMAL (ICD-793.6) ? hydronephrosis Orders: Urology Referral (Urology)   Problem # 6:  HEMATURIA (ICD-599.7) vs vaginal ( PMH TAH & BSO) Orders: TLB-CBC Platelet - w/Differential (85025-CBCD) TLB-Udip w/ Micro (81001-URINE) Urology Referral (Urology)   Complete Medication List: 1)  Lopressor 50 Mg Tabs (Metoprolol tartrate) .... 1/2 once daily 2)  Lorazepam 0.5 Mg Tabs (Lorazepam) .Marland Kitchen.. 1 tab bid 3)  Pravastatin Sodium 40 Mg Tabs (Pravastatin sodium) .Marland Kitchen.. 1 by mouth qd 4)  Benicar 40 Mg Tabs (Olmesartan medoxomil) .Marland Kitchen.. 1daily 5)  Restasis Emul (Cyclosporine emul) .... Two times a day 6)  Levoxyl 88 Mcg Tabs (Levothyroxine sodium) .Marland Kitchen.. 1 tab daily except tuesday and saturdaytake 1/2 tab 7)  Meclizine Hcl 25 Mg Tabs (Meclizine hcl) .Marland Kitchen.. 1 tab once daily 8)  Celebrex 200 Mg Caps (Celecoxib) .Marland Kitchen.. 1 to 2 tabs daily 9)  Fosamax 70 Mg Tabs (Alendronate sodium) .Marland Kitchen.. 1 tab weekly 10)  Centrum Silver Tabs (Multiple vitamins-minerals) .... Take 1 tablet by mouth once a day 11)  B Complex Tabs (B complex vitamins) .... As directed 12)  Caltrate W/ Vitamin D  .... As directed 13)  Baby Aspirin 81 Mg Chew (Aspirin) .... Take 1 tablet by mouth once a day as directed 14)  Vitamin E  .... As directed 15)  Ultram 50 Mg Tabs  (Tramadol hcl) .... 1/2 tab prn  Hypertension Assessment/Plan:      The patient's hypertensive risk group is category C: Target organ damage and/or diabetes.  Today's blood pressure is 118/78.    Lipid Assessment/Plan:      Based on NCEP/ATP III, the patient's risk factor category is "history of coronary disease, peripheral vascular disease, cerebrovascular disease, or aortic aneurysm".  From this information, the patient's calculated lipid goals are as follows: Total cholesterol goal is 200; LDL cholesterol goal is 100; HDL cholesterol goal is 40; Triglyceride goal is 150.  Her LDL cholesterol goal has not been met.  Secondary causes for hyperlipidemia have been ruled out.  She has been counseled on adjunctive measures for lowering her cholesterol and has been provided with dietary instructions.     Patient Instructions: 1)  Complete stool cards. Keep Urology appt    Prescriptions: FOSAMAX 70 MG  TABS (ALENDRONATE SODIUM) 1 tab weekly  #12 x 3   Entered and Authorized by:   Unice Cobble MD   Signed by:   Unice Cobble MD on 08/12/2007   Method used:   Print then Give to Patient   RxID:   [id number] MECLIZINE HCL 25 MG  TABS (MECLIZINE HCL) 1 tab once daily  #30 x 1   Entered and Authorized by:   Unice Cobble MD   Signed by:   Unice Cobble MD on 08/12/2007   Method used:   Print then Give to Patient   RxID:   [id number] LEVOXYL 88 MCG  TABS (LEVOTHYROXINE SODIUM) 1 tab daily except tuesday and saturdaytake 1/2 tab  #90 x 3   Entered and Authorized by:   Unice Cobble MD   Signed by:   Unice Cobble MD on 08/12/2007   Method used:   Print then Give to Patient   RxID:  [id number] BENICAR 40 MG  TABS (OLMESARTAN MEDOXOMIL) 1daily  #90 x 3   Entered and Authorized by:   Unice Cobble MD   Signed by:   Unice Cobble MD on 08/12/2007   Method used:   Print then Give to Patient   RxID:   [id number] PRAVASTATIN SODIUM 40 MG  TABS (PRAVASTATIN SODIUM) 1  by mouth qd  #90 x 3   Entered and Authorized by:   Unice Cobble MD   Signed by:   Unice Cobble MD on 08/12/2007   Method used:   Print then Give to Patient   RxID:   [id number] LORAZEPAM 0.5 MG  TABS (LORAZEPAM) 1 tab BID  #180 x 1   Entered and Authorized by:   Unice Cobble MD   Signed by:   Unice Cobble MD on 08/12/2007   Method used:   Print then Give to Patient   RxID:   [id number] LOPRESSOR 50 MG  TABS (METOPROLOL TARTRATE) 1/2 once daily  #90 x 1   Entered and Authorized by:   Unice Cobble MD   Signed by:   Unice Cobble MD on 08/12/2007   Method used:   Print then Give to Patient   RxID:   [id number]  ]  Appended Document: cpx& lab,cbs  Laboratory Results   Urine Tests   Date/Time Reported: August 12, 2007 2:54 PM   Routine Urinalysis   Color: yellow Appearance: Clear Glucose: negative   (Normal Range: Negative) Bilirubin: negative   (Normal Range: Negative) Ketone: negative   (Normal Range: Negative) Spec. Gravity: <1.005   (Normal Range: 1.003-1.035) Blood: negative   (Normal Range: Negative) pH: 7.5   (Normal Range: 5.0-8.0) Protein: negative   (Normal Range: Negative) Urobilinogen: negative   (Normal Range: 0-1) Nitrite: negative   (Normal Range: Negative) Leukocyte Esterace: negative   (Normal Range: Negative)    Comments: ..................................................................Marland KitchenHeath Lark CMA  August 12, 2007 2:55 PM

## 2010-07-24 NOTE — Consult Note (Signed)
Summary: [facility]   Imported By: Edmonia James 04/26/2010 13:00:53  _____________________________________________________________________  External Attachment:    Type:   Image     Comment:   External Document

## 2010-07-24 NOTE — Progress Notes (Signed)
Summary: new rx for lorazepam  Phone Note Call from Patient Call back at Home Phone [phone]   Caller: Patient Summary of Call: patient had cpx [id number] dr hopper wrote rx for lorazepam 0.5 mg #180 - patient said she takes it 3 times a day &    written for #270 - she wants new rx - [facility] -  Initial call taken by: Carol Spring,  Nov 03, 2009 2:10 PM  Follow-up for Phone Call        Per Arbie Cookey, patient is aware Dr.Hopper is out of the office, I reveiwed chart and did not see where med had been increased to three times a day, futher instruction to come from Dr.Hopper Follow-up by: Georgette Dover,  Nov 03, 2009 2:40 PM  Additional Follow-up for Phone Call Additional follow up Details #1::        it is OK to refill but it should be taken three times a day as needed , not three times a day as a maintenance drug Additional Follow-up by: Unice Cobble MD,  Nov 04, 2009 6:46 AM    New/Updated Medications: LORAZEPAM 0.5 MG  TABS (LORAZEPAM) 1 by mouth three times a day as needed ONLY Prescriptions: LORAZEPAM 0.5 MG  TABS (LORAZEPAM) 1 by mouth three times a day as needed ONLY  #270 x 0   Entered by:   Georgette Dover   Authorized by:   Unice Cobble MD   Signed by:   Georgette Dover on 11/07/2009   Method used:   Printed then faxed to ...       [facility] (retail)       [address] [place], [place]  [address]       Ph: [phone] or [phone]       Fax: [phone]   RxID:   [id number]

## 2010-07-24 NOTE — Letter (Signed)
Summary: Primary Care Consult Scheduled Letter  [facility] [place]   [place], [place] [address]   Phone: [phone]  Fax: [phone]      08/14/2007 MRN: [id number]  Deborah Shields [place], [place]  [address]    Dear [name],      We have scheduled an appointment for you.  At the recommendation of Dr.Hopper, we have scheduled you a consult with Dr. Terance Hart -- [facility] on 02.24.09 @ 9:15 a.m.  Their phone number is  [phone].  If this appointment day and time is not convenient for you, please feel free to call the office of the doctor you are being referred to at the number listed above and reschedule the appointment.     It is important for you to keep your scheduled appointments. We are here to make sure you are given good patient care. If yu have questions or you have made changes to your appointment, please notify us at  [phone], ask for [facility].    Thank you,  Patient Care Coordinator [facility]: Primary Care Consult Scheduled Letter ADDRESS FOR OFFICE UPDATED BEFORE BEING MAILED SHOULD BE 509 NOT 520.

## 2010-07-24 NOTE — Letter (Signed)
Summary: [facility]   Imported By: Edmonia James 04/26/2010 13:02:02  _____________________________________________________________________  External Attachment:    Type:   Image     Comment:   External Document

## 2010-07-24 NOTE — Progress Notes (Signed)
Summary: elevated ck  Phone Note Outgoing Call   Call placed by: Dawson Bills,  May 03, 2009 5:07 PM Details for Reason: CK elevated  Summary of Call: Discussed with Dr. Linna Darner.  Stop Pravastatin & recheck CPK in 1 week.  Patient aware.

## 2010-07-24 NOTE — Assessment & Plan Note (Signed)
Summary: DISCUSS LABS--MUSCLE ENZYME///SPH   Vital Signs:  Patient profile:   70 year old female Weight:      138.25 pounds Pulse rate:   71 / minute Pulse rhythm:   regular Resp:     17 per minute BP sitting:   126 / 80  (left arm) Cuff size:   large  Vitals Entered By: Allyn Kenner CMA (April 10, 2010 3:38 PM) CC: Pt here to discuss lab results.  Comments Muscles ache    Primary Care Provider:  Linus Orn  CC:  Pt here to discuss lab results. .  History of Present Illness: Hyperlipidemia Follow-Up      This is a 70 year old woman who presents for Hyperlipidemia follow-up.  The patient reports chronic  muscle aches & weakness, but denies GI upset, abdominal pain, flushing, itching, constipation, diarrhea, and fatigue.  Other symptoms include exercise intolerance and occasional  palpitations.  The patient denies the following symptoms: chest pain/pressure, dypsnea, syncope, and pedal edema.  Compliance with medications (by patient report) has been near 100%, but CK was 588 on only 10 mg Simvatatin daily. On Pravastatin 40 mg daily  CK was 543 in 04/2009.Off statins in 07/2009 her LDL was 200 with approximately 24% long term risk. She was intolerant to Zetiadue to bowel changes.  Dietary compliance has been good(heart healthy ).  The patient reports exercising occasionally but is limited by M-S issues.  Adjunctive measures currently used by the patient include ASA.  Dr Victorino December 01/2010 note reviewed; he had recommended low dose Crestor if issues with Simvastatin. She saw Dr Trudie Reed, Rheumatologist in 10/2009; she recommended NCT/EMG apparently ( no notes in EMR).   Current Medications (verified): 1)  Lopressor 50 Mg  Tabs (Metoprolol Tartrate) .... 1/2  Two Times A Day 2)  Lorazepam 0.5 Mg  Tabs (Lorazepam) .Marland Kitchen.. 1 By Mouth Three Times A Day As Needed Only 3)  Restasis   Emul (Cyclosporine Emul) .... Two Times A Day 4)  Levoxyl 88 Mcg  Tabs (Levothyroxine Sodium) .Marland Kitchen.. 1 Tab Daily Except  Tuesday and Saturdaytake 1/2 Tab 5)  Meclizine Hcl 25 Mg  Tabs (Meclizine Hcl) .Marland Kitchen.. 1 Tab Once Daily 6)  Celebrex 200 Mg  Caps (Celecoxib) .Marland Kitchen.. 1 To 2 Tabs Daily 7)  Centrum Silver   Tabs (Multiple Vitamins-Minerals) .... Take 1 Tablet By Mouth Once A Day 8)  B Complex   Tabs (B Complex Vitamins) .Marland Kitchen.. 1 By Mouth Once Daily 9)  Calcium 600mg / Vit D 1200 .Marland Kitchen.. 1 By Mouth Two Times A Day 10)  Baby Aspirin 81 Mg  Chew (Aspirin) .... Take 1 Tablet By Mouth Once A Day As Directed 11)  Vitamin E .... 1 By Mouth Once Daily 12)  Ultram 50 Mg  Tabs (Tramadol Hcl) .... 1/2-1 By Mouth Once Daily As Needed 13)  Vitamin D 1000 Unit Tabs (Cholecalciferol) .Marland Kitchen.. 1 By Mouth Once Daily 14)  Losartan Potassium 100 Mg Tabs (Losartan Potassium) .Marland Kitchen.. 1 Once Daily in Place of Azor 15)  Simvastatin 10 Mg Tabs (Simvastatin) .Marland Kitchen.. 1 At Bedtime 16)  Azithromycin 250 Mg Tabs (Azithromycin) .... As  Per  Pack 17)  Advair Diskus 100-50 Mcg/dose Aepb (Fluticasone-Salmeterol) .Marland Kitchen.. 1 Inhalation Every 12 Hrs ; Gargle After Use 18)  Benzonatate 100 Mg Caps (Benzonatate) .Marland Kitchen.. 1 Every 6 Hrs As Needed For Cough  Allergies (verified): 1)  ! Penicillin 2)  ! Wellbutrin 3)  ! Paxil 4)  ! Asa 5)  ! Lovastatin 6)  ! Codeine  7)  ! * Benazepril 8)  ! Pravachol (Pravastatin Sodium) 9)  ! Zetia (Ezetimibe)  Physical Exam  General:  Thin but  in no acute distress; alert,appropriate and cooperative throughout examination Lungs:  Normal respiratory effort, chest expands symmetrically. Lungs are clear to auscultation, no crackles or wheezes. Heart:  Normal rate and regular rhythm. S1 and S2 normal without gallop, murmur, click, rub or other extra sounds. Pulses:  R and L carotid,radial,dorsalis pedis and posterior tibial pulses are full and equal bilaterally. L carotid bruit Extremities:  No clubbing, cyanosis, edema. Tender to compression of muscle groups. Classic OA changes Neurologic:  alert & oriented X3 and strength normal in all  extremities.   Skin:  Intact without suspicious lesions or rashes Cervical Nodes:  No lymphadenopathy noted Psych:  memory intact for recent and remote, normally interactive, and good eye contact.     Impression & Recommendations:  Problem # 1:  HYPERLIPIDEMIA (B2193296.2)  Her updated medication list for this problem includes:    Simvastatin 10 Mg Tabs (Simvastatin) .Marland Kitchen... 1 at bedtime  Problem # 2:  MUSCLE PAIN (ICD-729.1) R/O underlying muscle condition; CK elevated with statins ( 543 on 40 mg Pravastatin  & 588 on Simvastatin 10 mg at bedtime ) Her updated medication list for this problem includes:    Celebrex 200 Mg Caps (Celecoxib) .Marland Kitchen... 1 to 2 tabs daily    Baby Aspirin 81 Mg Chew (Aspirin) .Marland Kitchen... Take 1 tablet by mouth once a day as directed    Ultram 50 Mg Tabs (Tramadol hcl) .Marland Kitchen... 1/2-1 by mouth once daily as needed  Complete Medication List: 1)  Lopressor 50 Mg Tabs (Metoprolol tartrate) .... 1/2  two times a day 2)  Lorazepam 0.5 Mg Tabs (Lorazepam) .Marland Kitchen.. 1 by mouth three times a day as needed only 3)  Restasis Emul (Cyclosporine emul) .... Two times a day 4)  Levoxyl 88 Mcg Tabs (Levothyroxine sodium) .Marland Kitchen.. 1 tab daily except tuesday and saturdaytake 1/2 tab 5)  Meclizine Hcl 25 Mg Tabs (Meclizine hcl) .Marland Kitchen.. 1 tab once daily 6)  Celebrex 200 Mg Caps (Celecoxib) .Marland Kitchen.. 1 to 2 tabs daily 7)  Centrum Silver Tabs (Multiple vitamins-minerals) .... Take 1 tablet by mouth once a day 8)  B Complex Tabs (B complex vitamins) .Marland Kitchen.. 1 by mouth once daily 9)  Calcium 600mg / Vit D 1200  .Marland KitchenMarland Kitchen. 1 by mouth two times a day 10)  Baby Aspirin 81 Mg Chew (Aspirin) .... Take 1 tablet by mouth once a day as directed 11)  Vitamin E  .... 1 by mouth once daily 12)  Ultram 50 Mg Tabs (Tramadol hcl) .... 1/2-1 by mouth once daily as needed 13)  Vitamin D 1000 Unit Tabs (Cholecalciferol) .Marland Kitchen.. 1 by mouth once daily 14)  Losartan Potassium 100 Mg Tabs (Losartan potassium) .Marland Kitchen.. 1 once daily in place of  azor 15)  Simvastatin 10 Mg Tabs (Simvastatin) .Marland Kitchen.. 1 at bedtime 16)  Azithromycin 250 Mg Tabs (Azithromycin) .... As  per  pack 17)  Advair Diskus 100-50 Mcg/dose Aepb (Fluticasone-salmeterol) .Marland Kitchen.. 1 inhalation every 12 hrs ; gargle after use 18)  Benzonatate 100 Mg Caps (Benzonatate) .Marland Kitchen.. 1 every 6 hrs as needed for cough  Patient Instructions: 1)  Stay off Simvastatin; check CPK & fasting lipids in 9  weeks.    Orders Added: 1)  Est. Patient Level IV [id number]

## 2010-07-24 NOTE — Assessment & Plan Note (Signed)
Summary: F/U RHEMO VISIT-REVIEW NMR-PT BRING MEDS/CBS   Vital Signs:  Patient profile:   70 year old female Weight:      135.6 pounds Pulse rate:   60 / minute Resp:     18 per minute BP sitting:   116 / 64  (left arm) Cuff size:   regular  Vitals Entered By: Georgette Dover (Nov 13, 2009 4:12 PM) CC: F/U from Rheumatologist and on labs , Lipid Management Comments REVIEWED MED LIST, PATIENT AGREED DOSE AND INSTRUCTION CORRECT    Primary Care Provider:  Linus Orn  CC:  F/U from Rheumatologist and on labs  and Lipid Management.  History of Present Illness: Dr Lenna Gilford note reviewed ; Celebrex Rxed for OA pain.  NMR Lipoprofile reviewed & risks discussed. CPK was elevated on Pravastatin. Dr. [name] monitors her carotids every 6 months.  Lipid Management History:      Positive NCEP/ATP III risk factors include female age 76 years old or older, family history for ischemic heart disease (males less than 40 years old), hypertension, and peripheral vascular disease.  Negative NCEP/ATP III risk factors include no history of early menopause without estrogen hormone replacement, non-diabetic, HDL cholesterol greater than 60, non-tobacco-user status, no ASHD (atherosclerotic heart disease), no prior stroke/TIA, and no history of aortic aneurysm.     Allergies: 1)  ! Penicillin 2)  ! Wellbutrin 3)  ! Paxil 4)  ! Asa 5)  ! Lovastatin 6)  ! Codeine 7)  ! * Benazepril  Past History:  Past Medical History: Diverticulosis, colon Hypothyroidism Hyperlipidemia: NMR 07/2009: LDL 200(2260/1233), TG 99, HDL 65. Hypertension microscopic hematuria, PMH of basal cell carcinoma,Dr Houston degenerative joint disease peripheral vascular disease of ICA bilat, Dr Ruta Hinds, [facility] Peripheral neuropathy, compressive  Physical Exam  General:  Thin ; alert,appropriate and cooperative throughout examination Lungs:  Normal respiratory effort, chest expands symmetrically. Lungs are clear to auscultation, no  crackles or wheezes. Heart:  Normal rate and regular rhythm. S1 and S2 normal without gallop, murmur, click, rub S4 with slurring. Rare premature Pulses:  R and L carotid,radial  and posterior tibial pulses are full and equal bilaterally. Decreased DPP Extremities:  No clubbing, cyanosis, edema.   Impression & Recommendations:  Problem # 1:  HYPERLIPIDEMIA (P102836.2)  Her updated medication list for this problem includes:    Zetia 10 Mg Tabs (Ezetimibe) .Marland Kitchen... 1 once daily  Problem # 2:  HYPERTENSION, ESSENTIAL NOS (ICD-401.9)  Her updated medication list for this problem includes:    Lopressor 50 Mg Tabs (Metoprolol tartrate) .Marland Kitchen... 1/2  two times a day    Azor 5-40 Mg Tabs (Amlodipine-olmesartan) .Marland Kitchen... 1 by mouth once daily: this costs $121 / 3 months  Complete Medication List: 1)  Lopressor 50 Mg Tabs (Metoprolol tartrate) .... 1/2  two times a day 2)  Lorazepam 0.5 Mg Tabs (Lorazepam) .Marland Kitchen.. 1 by mouth three times a day as needed only 3)  Restasis Emul (Cyclosporine emul) .... Two times a day 4)  Levoxyl 88 Mcg Tabs (Levothyroxine sodium) .Marland Kitchen.. 1 tab daily except tuesday and saturdaytake 1/2 tab 5)  Meclizine Hcl 25 Mg Tabs (Meclizine hcl) .Marland Kitchen.. 1 tab once daily 6)  Celebrex 200 Mg Caps (Celecoxib) .Marland Kitchen.. 1 to 2 tabs daily 7)  Centrum Silver Tabs (Multiple vitamins-minerals) .... Take 1 tablet by mouth once a day 8)  B Complex Tabs (B complex vitamins) .Marland Kitchen.. 1 by mouth once daily 9)  Caltrate W/ Vitamin D  .... 600mg  each 2 by mouth  once daily 10)  Baby Aspirin 81 Mg Chew (Aspirin) .... Take 1 tablet by mouth once a day as directed 11)  Vitamin E  .... 1 by mouth once daily 12)  Ultram 50 Mg Tabs (Tramadol hcl) .... 1/2-1 by mouth once daily as needed 13)  Vitamin D 1000 Unit Tabs (Cholecalciferol) .Marland Kitchen.. 1 by mouth once daily 14)  Zetia 10 Mg Tabs (Ezetimibe) .Marland Kitchen.. 1 once daily 15)  Losartan Potassium 100 Mg Tabs (Losartan potassium) .Marland Kitchen.. 1 once daily in place of azor  Lipid  Assessment/Plan:      Based on NCEP/ATP III, the patient's risk factor category is "history of coronary disease, peripheral vascular disease, cerebrovascular disease, or aortic aneurysm".  The patient's lipid goals are as follows: Total cholesterol goal is 200; LDL cholesterol goal is 120; HDL cholesterol goal is 40; Triglyceride goal is 150.  Her LDL cholesterol goal has not been met.  Secondary causes for hyperlipidemia have been ruled out.  She has been counseled on adjunctive measures for lowering her cholesterol and has been provided with dietary instructions.    Patient Instructions: 1)  Please schedule a follow-up appointment in 3 months.Lipid Panel & CPK  prior to visit, ICD-9:272.4; 995.20. Prescriptions: LOSARTAN POTASSIUM 100 MG TABS (LOSARTAN POTASSIUM) 1 once daily in place of Azor  #90 x 0   Entered and Authorized by:   Unice Cobble MD   Signed by:   Unice Cobble MD on 11/13/2009   Method used:   Print then Give to Patient   RxID:   [id number] ZETIA 10 MG TABS (EZETIMIBE) 1 once daily  #30 x 2   Entered and Authorized by:   Unice Cobble MD   Signed by:   Unice Cobble MD on 11/13/2009   Method used:   Print then Give to Patient   RxID:   [id number]

## 2010-07-24 NOTE — Letter (Signed)
Summary: Results Follow up Letter  [name]   [place], [place] [address]   Phone: [phone]  Fax: [phone]    08/18/2007 MRN: [id number]  [name] [address] [place], [place]  [address]  Dear Deborah Shields,  The following are the results of your recent test(s):  Test         Result    Pap Smear:        Normal _____  Not Normal _____ Comments: ______________________________________________________ Cholesterol: LDL(Bad cholesterol):         Your goal is less than:         HDL (Good cholesterol):       Your goal is more than: Comments:  ______________________________________________________ Mammogram:        Normal _____  Not Normal _____ Comments:  ___________________________________________________________________ Hemoccult:        Normal _____  Not normal _______ Comments:    _____________________________________________________________________ Other Tests:  Please see attached results and comments   We routinely do not discuss normal results over the telephone.  If you desire a copy of the results, or you have any questions about this information we can discuss them at your next office visit.   Sincerely,

## 2010-07-24 NOTE — Progress Notes (Signed)
Summary: HOP--RX  Phone Note Refill Request   Refills Requested: Medication #1:  PRAVASTATIN SODIUM 40 MG  TABS 1 by mouth qd  Medication #2:  LEVOXYL 88 MCG  TABS 1 tab daily except tuesday and saturdaytake 1/2 tab  Medication #3:  FOSAMAX 70 MG  TABS 1 tab weekly [facility]--[id number]  Initial call taken by: Velora Heckler,  June 07, 2008 11:18 AM      Prescriptions: FOSAMAX 70 MG  TABS (ALENDRONATE SODIUM) 1 tab weekly  #12 x 0   Entered by:   Verdie Mosher   Authorized by:   Unice Cobble MD   Signed by:   Verdie Mosher on 06/07/2008   Method used:   Printed then faxed to ...       [facility]             , [place]         Ph:        Fax: [phone]   RxID:   [id number] LEVOXYL 88 MCG  TABS (LEVOTHYROXINE SODIUM) 1 tab daily except tuesday and saturdaytake 1/2 tab  #90 x 0   Entered by:   Verdie Mosher   Authorized by:   Unice Cobble MD   Signed by:   Verdie Mosher on 06/07/2008   Method used:   Printed then faxed to ...       [facility]             , [place]         Ph:        Fax: [phone]   RxID:   [id number] PRAVASTATIN SODIUM 40 MG  TABS (PRAVASTATIN SODIUM) 1 by mouth qd  #90 x 0   Entered by:   Verdie Mosher   Authorized by:   Unice Cobble MD   Signed by:   Verdie Mosher on 06/07/2008   Method used:   Printed then faxed to ...       [facility]             , [place]         Ph:        Fax: [phone]   RxID:   [id number]
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
## 2010-07-24 NOTE — Progress Notes (Signed)
Summary: Refill Request  Phone Note Call from Patient Call back at Home Phone [phone]   Caller: Patient Summary of Call: Message left on voicemail: Please refax Simvastatin and Losartan rx's    Chrae Malloy CMA  March 27, 2010 2:17 PM     Prescriptions: SIMVASTATIN 10 MG TABS (SIMVASTATIN) 1 at bedtime  #90 x 0   Entered by:   [place] by:   Unice Cobble MD   Signed by:   Georgette Dover CMA on 03/27/2010   Method used:   Faxed to ...       [facility]             , [place]         Ph:        Fax: [phone]   RxID:   [id number] LOSARTAN POTASSIUM 100 MG TABS (LOSARTAN POTASSIUM) 1 once daily in place of Azor  #90 x 1   Entered by:   Georgette Dover CMA   Authorized by:   Unice Cobble MD   Signed by:   Georgette Dover CMA on 03/27/2010   Method used:   Faxed to ...       [facility]             , [place]         Ph:        Fax: [phone]   RxID:   [id number]

## 2010-07-24 NOTE — Assessment & Plan Note (Signed)
Summary: URI/scm   Vital Signs:  Patient profile:   70 year old female Weight:      138.4 pounds BMI:     22.25 Temp:     97.7 degrees F oral Pulse rate:   64 / minute Resp:     17 per minute BP sitting:   120 / 70  (left arm) Cuff size:   large  Vitals Entered By: Georgette Dover CMA (March 05, 2010 3:37 PM) CC: 1.) URI-?    2.) Refill simvastain and losartan, patient with pending lab appointment 03/2010, URI symptoms   Primary Care Ifeoluwa Beller:  [facility]  CC:  1.) URI-?    2.) Refill simvastain and losartan, patient with pending lab appointment 03/2010, and URI symptoms.  History of Present Illness: RTI Symptoms      This is a 70 year old woman who presents with RTI symptoms since 03/02/2010.  The patient reports nasal congestion and productive cough with yellow green secretions, but denies purulent nasal discharge.  Associated symptoms include  low-grade fever (<100.5 degrees), dyspnea, and wheezing. No PMH of asthma; never smoked. The patient also reports mild, intermittent  headache & minor  facial pain.  The patient denies the following risk factors for Strep sinusitis: tender adenopathy.  Rx: [place], Mucus Relief.  Current Medications (verified): 1)  Lopressor 50 Mg  Tabs (Metoprolol Tartrate) .... 1/2  Two Times A Day 2)  Lorazepam 0.5 Mg  Tabs (Lorazepam) .Marland Kitchen.. 1 By Mouth Three Times A Day As Needed Only 3)  Restasis   Emul (Cyclosporine Emul) .... Two Times A Day 4)  Levoxyl 88 Mcg  Tabs (Levothyroxine Sodium) .Marland Kitchen.. 1 Tab Daily Except Tuesday and Saturdaytake 1/2 Tab 5)  Meclizine Hcl 25 Mg  Tabs (Meclizine Hcl) .Marland Kitchen.. 1 Tab Once Daily 6)  Celebrex 200 Mg  Caps (Celecoxib) .Marland Kitchen.. 1 To 2 Tabs Daily 7)  Centrum Silver   Tabs (Multiple Vitamins-Minerals) .... Take 1 Tablet By Mouth Once A Day 8)  B Complex   Tabs (B Complex Vitamins) .Marland Kitchen.. 1 By Mouth Once Daily 9)  Calcium 600mg / Vit D 1200 .Marland Kitchen.. 1 By Mouth Two Times A Day 10)  Baby Aspirin 81 Mg  Chew (Aspirin) .... Take 1 Tablet By  Mouth Once A Day As Directed 11)  Vitamin E .... 1 By Mouth Once Daily 12)  Ultram 50 Mg  Tabs (Tramadol Hcl) .... 1/2-1 By Mouth Once Daily As Needed 13)  Vitamin D 1000 Unit Tabs (Cholecalciferol) .Marland Kitchen.. 1 By Mouth Once Daily 14)  Losartan Potassium 100 Mg Tabs (Losartan Potassium) .Marland Kitchen.. 1 Once Daily in Place of Azor 15)  Vitamin D3 1000 Unit Tabs (Cholecalciferol) .Marland Kitchen.. 1 By Mouth Once Daily 16)  Simvastatin 10 Mg Tabs (Simvastatin) .Marland Kitchen.. 1 At Bedtime  Allergies: 1)  ! Penicillin 2)  ! Wellbutrin 3)  ! Paxil 4)  ! Asa 5)  ! Lovastatin 6)  ! Codeine 7)  ! * Benazepril 8)  ! Pravachol (Pravastatin Sodium) 9)  ! Zetia (Ezetimibe)  Physical Exam  General:  Thin,in no acute distress; alert,appropriate and cooperative throughout examination Ears:  External ear exam shows no significant lesions or deformities.  Otoscopic examination reveals clear canals, tympanic membranes are intact bilaterally without bulging, retraction, inflammation or discharge. Hearing is grossly normal bilaterally. Nose:  External nasal examination shows no deformity or inflammation. Nasal mucosa are pink and moist without lesions or exudates. Mouth:  Oral mucosa and oropharynx without lesions or exudates.  Upper plate Lungs:  Normal  respiratory effort, chest expands symmetrically. Lungs : low grade  crackles & wheezes. Heart:  Normal rate and regular rhythm. S1 and S2 normal without gallop, murmur, click, rub. Cervical Nodes:  Minor cx LA on L Axillary Nodes:  No palpable lymphadenopathy   Impression & Recommendations:  Problem # 1:  BRONCHITIS-ACUTE (ICD-466.0)  Her updated medication list for this problem includes:    Azithromycin 250 Mg Tabs (Azithromycin) .Marland Kitchen... As  per  pack    Advair Diskus 100-50 Mcg/dose Aepb (Fluticasone-salmeterol) .Marland Kitchen... 1 inhalation every 12 hrs ; gargle after use    Benzonatate 100 Mg Caps (Benzonatate) .Marland Kitchen... 1 every 6 hrs as needed for cough  Orders: Prescription Created  Electronically [phone])  Problem # 2:  URI ([id number])  Her updated medication list for this problem includes:    Celebrex 200 Mg Caps (Celecoxib) .Marland Kitchen... 1 to 2 tabs daily    Baby Aspirin 81 Mg Chew (Aspirin) .Marland Kitchen... Take 1 tablet by mouth once a day as directed    Benzonatate 100 Mg Caps (Benzonatate) .Marland Kitchen... 1 every 6 hrs as needed for cough  Orders: Prescription Created Electronically ([phone])  Complete Medication List: 1)  Lopressor 50 Mg Tabs (Metoprolol tartrate) .... 1/2  two times a day 2)  Lorazepam 0.5 Mg Tabs (Lorazepam) .Marland Kitchen.. 1 by mouth three times a day as needed only 3)  Restasis Emul (Cyclosporine emul) .... Two times a day 4)  Levoxyl 88 Mcg Tabs (Levothyroxine sodium) .Marland Kitchen.. 1 tab daily except tuesday and saturdaytake 1/2 tab 5)  Meclizine Hcl 25 Mg Tabs (Meclizine hcl) .Marland Kitchen.. 1 tab once daily 6)  Celebrex 200 Mg Caps (Celecoxib) .Marland Kitchen.. 1 to 2 tabs daily 7)  Centrum Silver Tabs (Multiple vitamins-minerals) .... Take 1 tablet by mouth once a day 8)  B Complex Tabs (B complex vitamins) .Marland Kitchen.. 1 by mouth once daily 9)  Calcium 600mg / Vit D 1200  .Marland KitchenMarland Kitchen. 1 by mouth two times a day 10)  Baby Aspirin 81 Mg Chew (Aspirin) .... Take 1 tablet by mouth once a day as directed 11)  Vitamin E  .... 1 by mouth once daily 12)  Ultram 50 Mg Tabs (Tramadol hcl) .... 1/2-1 by mouth once daily as needed 13)  Vitamin D 1000 Unit Tabs (Cholecalciferol) .Marland Kitchen.. 1 by mouth once daily 14)  Losartan Potassium 100 Mg Tabs (Losartan potassium) .Marland Kitchen.. 1 once daily in place of azor 15)  Vitamin D3 1000 Unit Tabs (Cholecalciferol) .Marland Kitchen.. 1 by mouth once daily 16)  Simvastatin 10 Mg Tabs (Simvastatin) .Marland Kitchen.. 1 at bedtime 17)  Azithromycin 250 Mg Tabs (Azithromycin) .... As  per  pack 18)  Advair Diskus 100-50 Mcg/dose Aepb (Fluticasone-salmeterol) .Marland Kitchen.. 1 inhalation every 12 hrs ; gargle after use 19)  Benzonatate 100 Mg Caps (Benzonatate) .Marland Kitchen.. 1 every 6 hrs as needed for cough  Patient Instructions: 1)  Drink as much NON  dairy  fluid as you can tolerate for the next few days. Prescriptions: BENZONATATE 100 MG CAPS (BENZONATATE) 1 every 6 hrs as needed for cough  #20 x 0   Entered and Authorized by:   Unice Cobble MD   Signed by:   Unice Cobble MD on 03/05/2010   Method used:   Faxed to ...       [facility] (retail)       [address] [place], [place]  [address]  Ph: [phone] or [phone]       Fax: [phone]   [id number] ADVAIR DISKUS 100-50 MCG/DOSE AEPB (FLUTICASONE-SALMETEROL) 1 inhalation every 12 hrs ; gargle after use  #1 x 0   Entered and Authorized by:   Unice Cobble MD   Signed by:   Unice Cobble MD on 03/05/2010   Method used:   Samples Given   RxID:   [id number] AZITHROMYCIN 250 MG TABS (AZITHROMYCIN) as  per  pack  #1 x 0   Entered and Authorized by:   Unice Cobble MD   Signed by:   Unice Cobble MD on 03/05/2010   Method used:   Faxed to ...       [facility] (retail)       [address] [place], [place]  [address]       Ph: [phone] or [phone]       Fax: [phone]   RxID:   [id number] SIMVASTATIN 10 MG TABS (SIMVASTATIN) 1 at bedtime  #90 x 0   Entered by:   Georgette Dover CMA   Authorized by:   Unice Cobble MD   Signed by:   Georgette Dover CMA on 03/05/2010   Method used:   Faxed to ...       [facility]order)             , [place]         Ph:        Fax: [phone]   RxID:   [id number] LOSARTAN POTASSIUM 100 MG TABS (LOSARTAN POTASSIUM) 1 once daily in place of Azor  #90 x 1   Entered by:   Georgette Dover CMA   Authorized by:   Unice Cobble MD   Signed by:   Georgette Dover CMA on 03/05/2010   Method used:   Faxed to ...       [facility] (mail[facility]order)             , [place]         Ph:        Fax: [phone]   RxID:    [id number]

## 2010-07-24 NOTE — Letter (Signed)
Summary: Results Follow up Letter  [facility]   [place], [place] [address]   Phone: [phone]  Fax: [phone]    05/04/2009 MRN: [id number]  ERZA SHOR [address] [place], [place]  [address]  Dear Ms. Donnell,  The following are the results of your recent test(s):  Test         Result    Pap Smear:        Normal _____  Not Normal _____ Comments: ______________________________________________________ Cholesterol: LDL(Bad cholesterol):         Your goal is less than:         HDL (Good cholesterol):       Your goal is more than: Comments:  ______________________________________________________ Mammogram:        Normal _____  Not Normal _____ Comments:  ___________________________________________________________________ Hemoccult:        Normal _____  Not normal _______ Comments:    _____________________________________________________________________ Other Tests: Please see attached labs done on 05/01/2009    We routinely do not discuss normal results over the telephone.  If you desire a copy of the results, or you have any questions about this information we can discuss them at your next office visit.   Sincerely,

## 2010-07-24 NOTE — Letter (Signed)
Summary: Colonoscopy Letter  [facility]  [place], [place] [address]   Phone: [phone]  Fax: [phone]      Oct 27, 2009 MRN: [id number]   TANDRA LUCKY [address], [place]  [address]   Dear Ms. Pasquarelli,   According to your medical record, it is time for you to schedule a Colonoscopy. The American Cancer Society recommends this procedure as a method to detect early colon cancer. Patients with a family history of colon cancer, or a personal history of colon polyps or inflammatory bowel disease are at increased risk.  This letter has been generated based on the recommendations made at the time of your procedure. If you feel that in your particular situation this may no longer apply, please contact our office.  Please call our office at [phone] to schedule this appointment or to update your records at your earliest convenience.  Thank you for cooperating with [place] to provide you with the very best care possible.   Sincerely,  Milus Banister, M.D.  [facility] Gastroenterology Division [phone]

## 2010-07-24 NOTE — Assessment & Plan Note (Signed)
Summary: THINK SHE NEEDS AN RECHECK FOR MED  [place]   Vital Signs:  Patient Profile:   70 Years Old Female Weight:      124.38 pounds Pulse rate:   56 / minute Pulse rhythm:   regular BP sitting:   128 / 74  (left arm) Cuff size:   large  Vitals Entered By: Janelle Floor (December 08, 2006 8:19 AM)               Chief Complaint:  needs labs for meds; hypothyroidism & dyslipidemia.  History of Present Illness: CC: dry cough attributed to Benazepril(see ACV).Also ? of SBE necessity raised by dentist.  Acute Visit History:      The patient complains of cough.  Other comments include: associated with hoarseness.  The cough interferes with her sleep.  The character of the cough is described as nonproductive.  She has no history of COPD.  There is no history of wheezing, shortness of breath, respiratory retractions, tachypnea, or cyanosis associated with her cough.        The patient has no history of diabetes.         Lipid Management History:      Positive NCEP/ATP III risk factors include female age 87 years old or older, family history for ischemic heart disease (males less than 43 years old), hypertension, and peripheral vascular disease.  Negative NCEP/ATP III risk factors include no history of early menopause without estrogen hormone replacement, non-diabetic, HDL cholesterol greater than 60, non-tobacco-user status, no ASHD (atherosclerotic heart disease), no prior stroke/TIA, and no history of aortic aneurysm.      Current Allergies: ! PENICILLIN ! [facility] ! PAXIL ! ASA ! LOVASTATIN (LOVASTATIN TABS)  Past Surgical History:    Hysterectomy age 65 due to dysfunctional menses; HRT X 25-30 years    BSO as sister had ovarian CA    Risk Factors:  Tobacco use:  never Exercise:  yes    Type:  walk 1/2 mpd or > w/o C-P sx  Family History Risk Factors:    Family History of MI in females < 48 years old:  no    Family History of MI in males < 7 years old:  yes   Review  of Systems  General      Denies chills, fatigue, fever, loss of appetite, malaise, sleep disorder, sweats, weakness, and weight loss.  ENT      Denies decreased hearing, difficulty swallowing, ear discharge, earache, hoarseness, nasal congestion, nosebleeds, postnasal drainage, ringing in ears, sinus pressure, and sore throat.  CV      Denies bluish discoloration of lips or nails, chest pain or discomfort, difficulty breathing at night, difficulty breathing while lying down, fainting, fatigue, leg cramps with exertion, lightheadness, near fainting, palpitations, shortness of breath with exertion, swelling of feet, swelling of hands, and weight gain.  Resp      Complains of cough.      Denies chest discomfort, chest pain with inspiration, coughing up blood, excessive snoring, hypersomnolence, morning headaches, pleuritic, shortness of breath, sputum productive, and wheezing.      see CC  Derm      Denies changes in color of skin, changes in nail beds, dryness, excessive perspiration, flushing, hair loss, insect bite(s), itching, lesion(s), poor wound healing, and rash.      no hair/skin change  Endo      Complains of excessive thirst.      Denies cold intolerance, excessive hunger, excessive urination, heat intolerance, polyuria,  and weight change.      ? due to meds   Physical Exam  General:     Well-developed,well-nourished,in no acute distress; alert,appropriate and cooperative throughout examination Eyes:     pupils equal, pupils round, pupils reactive to light, pupils react to accomodation, corneas and lenses clear, and no injection. Full EOM  Ears:     External ear exam shows no significant lesions or deformities.  Otoscopic examination reveals clear canals, tympanic membranes are intact bilaterally without bulging, retraction, inflammation or discharge. Hearing is grossly normal bilaterally. Nose:     External nasal examination shows no deformity or inflammation. Nasal mucosa  are pink and moist without lesions or exudates. Mouth:     Oral mucosa and oropharynx without lesions or exudates.  Teeth in good repair. Neck:     sl asymmetry of thyroid R>L; L carotid bruit Lungs:     Normal respiratory effort, chest expands symmetrically. Lungs are clear to auscultation, no crackles or wheezes. Heart:     classic click murmur Abdomen:     Bowel sounds positive,abdomen soft and non-tender without masses, organomegaly or hernias noted. Pulses:     absent PP ; no femoral bruits Extremities:     No clubbing, cyanosis, edema, or deformity noted with normal full range of motion of all joints.   Neurologic:     DTRs 0-1/2 + Skin:     rubror of feet    Impression & Recommendations:  Problem # 1:  HYPOTHYROIDISM (ICD-244.9) Assessment: Deteriorated  Her updated medication list for this problem includes:    Synthroid 88 Mcg Tabs (Levothyroxine sodium)  Orders: TLB-TSH (Thyroid Stimulating Hormone) (84443-TSH)   Problem # 2:  HYPERLIPIDEMIA NEC/NOS (ICD-272.4)  Her updated medication list for this problem includes:    Pravastatin Sodium 40 Mg Tabs (Pravastatin sodium)  Orders: TLB-Lipid Panel (80061-LIPID) TLB-ALT (SGPT) (84460-ALT) TLB-AST (SGOT) (84450-SGOT) TLB-Lipid Panel (80061-LIPID)   Problem # 3:  HYPERTENSION, ESSENTIAL NOS (ICD-401.9)  The following medications were removed from the medication list:    Benazepril Hcl 40 Mg Tabs (Benazepril hcl) .Marland Kitchen... 1/2 tab once daily  Her updated medication list for this problem includes:    Lopressor 50 Mg Tabs (Metoprolol tartrate) .Marland Kitchen... 1/2 once daily    Benicar 40 Mg Tabs (Olmesartan medoxomil) .Marland Kitchen... 1daily  Orders: TLB-Creatinine, Blood (82565-CREA) TLB-BUN (Urea Nitrogen) (84520-BUN) TLB-Potassium (K+) (84132-K)   Problem # 4:  ADVEF, DRUG/MEDICINAL/BIOLOGICAL SUBST NOS (ICD-995.20)  Medications Added to Medication List This Visit: 1)  Benicar 40 Mg Tabs (Olmesartan medoxomil) .Marland Kitchen..  1daily  Lipid Assessment/Plan:      Based on NCEP/ATP III, the patient's risk factor category is "history of coronary disease, peripheral vascular disease, cerebrovascular disease, or aortic aneurysm".  From this information, the patient's calculated lipid goals are as follows: Total cholesterol goal is 200; LDL cholesterol goal is 100; HDL cholesterol goal is 40; Triglyceride goal is 150.     Patient Instructions: 1)  Change Benazepril to Benicar.LDL goal =< 100 (on pravastatin 40 mg)

## 2010-07-24 NOTE — Letter (Signed)
Summary: [facility]  [facility]   Imported By: Edmonia James 04/27/2010 09:18:03  _____________________________________________________________________  External Attachment:    Type:   Image     Comment:   External Document

## 2010-07-24 NOTE — Progress Notes (Signed)
Summary: bp elevated/  Phone Note Call from Patient Call back at Home Phone [phone]   Caller: Patient Call For: bp elevated Summary of Call: pt called left msg bp elevated last 157/96, 135/79 says bp all over the place, right now taking Benicar1 a day 40 mg and then generic Lopressor  taking 1/2 in in am and 1/2 in evening seeing Dr Melvern Banker on 06/06/08. want to know should I  do because BP is not settling down. Seen 05/02/08 Initial call taken by: Verdie Mosher,  May 13, 2008 4:54 PM  Follow-up for Phone Call        Per Dr Etter Sjogren pt needs to go to urgent care tonight and schedule ov for monday with Dr Linna Darner, ppt informed and agreed and ov scheduled .Verdie Mosher  May 13, 2008 5:10 PM  Follow-up by: Verdie Mosher,  May 13, 2008 5:10 PM

## 2010-07-24 NOTE — Progress Notes (Signed)
Summary: Where to send RX  Phone Note Outgoing Call   Call placed by: Georgette Dover,  May 06, 2008 1:35 PM Call placed to: Patient Summary of Call: Left message on machine for patient to return call when avaliable, Reason for call Rx for Lopressor not given to patient @ 05/02/2008 OV. ? if patient would like for me to sent to local pharmacy? , mail to patient, or pick up./[facility]  May 06, 2008 1:37 PM   Follow-up for Phone Call        SPOKE WITH PATIENT, PATIENT WOULD [place]. DONE Follow-up by: Georgette Dover,  May 06, 2008 3:37 PM

## 2010-07-24 NOTE — Assessment & Plan Note (Signed)
Summary: B/P CONCERNS,PRESSURE IN CHEST AND HEADACHE/SCM   Vital Signs:  Patient Profile:   70 Years Old Female Weight:      130 pounds Temp:     98.1 degrees F oral Pulse rate:   72 / minute Resp:     18 per minute BP sitting:   144 / 98  (left arm) Cuff size:   regular  Vitals Entered By: Georgette Dover (May 02, 2008 2:18 PM)                 Chief Complaint:  B/P CONCERNS, PRESSURE IN CHEST, and PRESSURE IN HEAD X FEW  WEEKS.  History of Present Illness: BP 137-179/ 86-102; 11/06 BP was 143/101 as per nurse. Carotid Doppler q 6 months @ Dr Nona Dell office." Heavy chest  pressure, like indigestion " w/o trigger; no associated nausea or sweating. Last episode of cp was several days ago @ rest. Benicar raised to 1 once daily as of 05/01/08; Lopressor 50 mg 1/2 once daily.  Hypertension History:      She complains of chest pain, palpitations, visual symptoms, and neurologic problems, but denies headache, dyspnea with exertion, orthopnea, PND, peripheral edema, syncope, and side effects from treatment.  She notes no problems with any antihypertensive medication side effects.        Positive major cardiovascular risk factors include female age 59 years old or older, hyperlipidemia, hypertension, and family history for ischemic heart disease (males less than 5 years old).  Negative major cardiovascular risk factors include no history of diabetes and non-tobacco-user status.        Positive history for target organ damage include peripheral vascular disease.  Further assessment for target organ damage reveals no history of ASHD or stroke/TIA.       Current Allergies (reviewed today): ! PENICILLIN ! [facility] ! PAXIL ! ASA ! LOVASTATIN ! CODEINE ! * BENAZEPRIL  Past Medical History:    Diverticulosis, colon    Hypothyroidism     neuropathy    Hyperlipidemia    Hypertension    microscopic hematuria    hypercholesterolemia    basal cell carcinoma    degenerative joint  disease    depression    peripheral vascular disease      Past Surgical History:    Reviewed history from 08/11/2007 and no changes required:       Hysterectomy age 41 due to dysfunctional menses; HRT X 25-30 years       BSO  prophylactically ( sister had ovarian CA)       Appendectomy       Cholecystectomy       Tonsillectomy       Orthopedic surgeries (elbows, shoulder surgery X 3, right hand)       Right carpal tunnel (1989)       Toe nail removed       Bunionectomy right foot       Colonoscopy- tics (2001),  Colonoscopy & Endoscopy (2006)       Basal cell  LLE (03/2006)     Review of Systems  Eyes      Complains of blurring.      Denies double vision and vision loss-both eyes.      Cataracts affect vision  CV      Complains of chest pain or discomfort.      Denies bluish discoloration of lips or nails and leg cramps with exertion.  Resp      Denies chest pain with  inspiration and pleuritic.  GI      Denies abdominal pain, bloody stools, dark tarry stools, indigestion, nausea, and vomiting.  Neuro      Complains of poor balance.      Denies disturbances in coordination, numbness, and tingling.   Physical Exam  General:     in no acute distress; alert,appropriate and cooperative throughout examination; thin habitus Mouth:     Oral mucosa and oropharynx without lesions or exudates.  Upper plate;pharynx pink and moist.   Lungs:     Normal respiratory effort, chest expands symmetrically. Lungs are clear to auscultation, no crackles or wheezes. Heart:     Normal rate and regular rhythm. S1 and S2 normal without gallop, murmur, click, rub. S4 with slurring Abdomen:     Bowel sounds positive,abdomen soft and non-tender without masses, organomegaly or hernias noted. Pulses:     R and L carotid,radial pulses are full and equal bilaterally. L carotid bruit. Decreased pedal pulses Extremities:     No clubbing, cyanosis, edema. Severe mixed arthritic changes Skin:      Intact without suspicious lesions or rashes Psych:     memory intact for recent and remote, normally interactive, good eye contact, not anxious appearing, and not depressed appearing.      Impression & Recommendations:  Problem # 1:  HYPERTENSION, ESSENTIAL NOS (ICD-401.9)  Her updated medication list for this problem includes:    Lopressor 50 Mg Tabs (Metoprolol tartrate) .Marland Kitchen... 1/2  two times a day    Benicar 40 Mg Tabs (Olmesartan medoxomil) .Marland Kitchen... 1daily  Orders: EKG w/ Interpretation (93000) Cardiology Referral (Cardiology)   Problem # 2:  CHEST PAIN (ICD-786.50)  Orders: EKG w/ Interpretation (93000) Cardiology Referral (Cardiology)   Problem # 3:  PERIPHERAL VASCULAR DISEASE (ICD-443.9)  Orders: EKG w/ Interpretation (93000) Cardiology Referral (Cardiology)   Complete Medication List: 1)  Lopressor 50 Mg Tabs (Metoprolol tartrate) .... 1/2  two times a day 2)  Lorazepam 0.5 Mg Tabs (Lorazepam) .Marland Kitchen.. 1 tab bid 3)  Pravastatin Sodium 40 Mg Tabs (Pravastatin sodium) .Marland Kitchen.. 1 by mouth qd 4)  Benicar 40 Mg Tabs (Olmesartan medoxomil) .Marland Kitchen.. 1daily 5)  Restasis Emul (Cyclosporine emul) .... Two times a day 6)  Levoxyl 88 Mcg Tabs (Levothyroxine sodium) .Marland Kitchen.. 1 tab daily except tuesday and saturdaytake 1/2 tab 7)  Meclizine Hcl 25 Mg Tabs (Meclizine hcl) .Marland Kitchen.. 1 tab once daily 8)  Celebrex 200 Mg Caps (Celecoxib) .Marland Kitchen.. 1 to 2 tabs daily 9)  Fosamax 70 Mg Tabs (Alendronate sodium) .Marland Kitchen.. 1 tab weekly 10)  Centrum Silver Tabs (Multiple vitamins-minerals) .... Take 1 tablet by mouth once a day 11)  B Complex Tabs (B complex vitamins) .... As directed 12)  Caltrate W/ Vitamin D  .... As directed 13)  Baby Aspirin 81 Mg Chew (Aspirin) .... Take 1 tablet by mouth once a day as directed 14)  Vitamin E  .... As directed 15)  Ultram 50 Mg Tabs (Tramadol hcl) .... 1/2 tab prn 16)  Vitamin D 1000 Unit Tabs (Cholecalciferol) .Marland Kitchen.. 1 by mouth once daily  Hypertension Assessment/Plan:       The patient's hypertensive risk group is category C: Target organ damage and/or diabetes.  Her calculated 10 year risk of coronary heart disease is 7 %.  Today's blood pressure is 144/98.     Patient Instructions: 1)  Increase Lopressor to 1/2 two times a day . Keep Cardiology appt with Dr Melvern Banker   Prescriptions: LOPRESSOR 50 MG  TABS (METOPROLOL  TARTRATE) 1/2  two times a day  #90 x 1   Entered and Authorized by:   Unice Cobble MD   Signed by:   Unice Cobble MD on 05/03/2008   Method used:   Print then Give to Patient   RxID:   [id number]  ]

## 2010-07-24 NOTE — Assessment & Plan Note (Signed)
Summary: discuss med/cbs   Vital Signs:  Patient profile:   70 year old female Weight:      137.2 pounds Pulse rate:   64 / minute Resp:     17 per minute BP sitting:   118 / 74  (left arm) Cuff size:   regular  Vitals Entered By: Georgette Dover (January 01, 2010 3:45 PM) CC: Discuss Chlosterol risk factor, Lipid Management Comments REVIEWED MED LIST, PATIENT AGREED DOSE AND INSTRUCTION CORRECT    Primary Care Provider:  Linus Orn  CC:  Discuss Chlosterol risk factor and Lipid Management.  History of Present Illness: She has been intolerant Lovastatin, Pravastatin &  now Zetia. Her father had premature MI & died @ 38. She will see Cardiologist who is assuming care of Dr Ky Barban patients. NMR reviewed ; LDL goal = < 120. Simvastatin is on her coverage list.  Lipid Management History:      Positive NCEP/ATP III risk factors include female age 103 years old or older, family history for ischemic heart disease (males less than 60 years old), hypertension, and peripheral vascular disease.  Negative NCEP/ATP III risk factors include no history of early menopause without estrogen hormone replacement, non-diabetic, HDL cholesterol greater than 60, non-tobacco-user status, no ASHD (atherosclerotic heart disease), no prior stroke/TIA, and no history of aortic aneurysm.     Allergies: 1)  ! Penicillin 2)  ! Wellbutrin 3)  ! Paxil 4)  ! Asa 5)  ! Lovastatin 6)  ! Codeine 7)  ! * Benazepril 8)  ! Pravachol (Pravastatin Sodium) 9)  ! Zetia (Ezetimibe)  Past History:  Past Medical History: Diverticulosis, colon Hypothyroidism Hyperlipidemia: NMR 07/2009: LDL 200(2260/1233), TG 99, HDL 65. LDL goal = < 120, ideally < 100. Hypertension microscopic hematuria, PMH of basal cell carcinoma,Dr Houston degenerative joint disease peripheral vascular disease of ICA bilat, Dr Ruta Hinds, [facility] Peripheral neuropathy, compressive  Family History: Father: MI  @ age 3 Mother: Colon CA,  CVA,  nephrectomy ? reason (died in 43's) Siblings: 76 sisters--1 w/ovarian CA, kidney disease (died)                                1 w/AV valve, aortic root                                1 w/MV replacement               6 brothers--2 w/CAD, both deceased                                   1 died @ early age of heart problems                                   2 w/AV replacement                                   1 w/ AV disease                                   1 w/alcoholism, liver disease secondary to  this;  M uncle bone  cancer  Physical Exam  General:  Appears younger than age,well-nourished; alert,appropriate and cooperative throughout examination Lungs:  Normal respiratory effort, chest expands symmetrically. Lungs are clear to auscultation, no crackles or wheezes. Heart:  Normal rate and regular rhythm. S1 and S2 normal without gallop, murmur, click, rub.S4 with slurring Pulses:  R and L carotid,radial  pulses are full and equal bilaterally. Decreased pedal pulses Extremities:  No clubbing, cyanosis, edema.    Impression & Recommendations:  Problem # 1:  HYPERLIPIDEMIA (B2193296.2)  The following medications were removed from the medication list:    Zetia 10 Mg Tabs (Ezetimibe) .Marland Kitchen... 1 once daily Her updated medication list for this problem includes:    Simvastatin 10 Mg Tabs (Simvastatin) .Marland Kitchen... 1 at bedtime  Complete Medication List: 1)  Lopressor 50 Mg Tabs (Metoprolol tartrate) .... 1/2  two times a day 2)  Lorazepam 0.5 Mg Tabs (Lorazepam) .Marland Kitchen.. 1 by mouth three times a day as needed only 3)  Restasis Emul (Cyclosporine emul) .... Two times a day 4)  Levoxyl 88 Mcg Tabs (Levothyroxine sodium) .Marland Kitchen.. 1 tab daily except tuesday and saturdaytake 1/2 tab 5)  Meclizine Hcl 25 Mg Tabs (Meclizine hcl) .Marland Kitchen.. 1 tab once daily 6)  Celebrex 200 Mg Caps (Celecoxib) .Marland Kitchen.. 1 to 2 tabs daily 7)  Centrum Silver Tabs (Multiple vitamins-minerals) .... Take 1 tablet by mouth once a day 8)  B Complex  Tabs (B complex vitamins) .Marland Kitchen.. 1 by mouth once daily 9)  Calcium 600mg / Vit D 075-GRM  .Marland KitchenMarland Kitchen. 1 by mouth two times a day 10)  Baby Aspirin 81 Mg Chew (Aspirin) .... Take 1 tablet by mouth once a day as directed 11)  Vitamin E  .... 1 by mouth once daily 12)  Ultram 50 Mg Tabs (Tramadol hcl) .... 1/2-1 by mouth once daily as needed 13)  Vitamin D 1000 Unit Tabs (Cholecalciferol) .Marland Kitchen.. 1 by mouth once daily 14)  Losartan Potassium 100 Mg Tabs (Losartan potassium) .Marland Kitchen.. 1 once daily in place of azor 15)  Vitamin D3 1000 Unit Tabs (Cholecalciferol) .Marland Kitchen.. 1 by mouth once daily 16)  Simvastatin 10 Mg Tabs (Simvastatin) .Marland Kitchen.. 1 at bedtime  Lipid Assessment/Plan:      Based on NCEP/ATP III, the patient's risk factor category is "history of coronary disease, peripheral vascular disease, cerebrovascular disease, or aortic aneurysm".  The patient's lipid goals are as follows: Total cholesterol goal is 200; LDL cholesterol goal is 120; HDL cholesterol goal is 40; Triglyceride goal is 150.  Her LDL cholesterol goal has not been met.  Secondary causes for hyperlipidemia have been ruled out.  She has been counseled on adjunctive measures for lowering her cholesterol and has been provided with dietary instructions.    Patient Instructions: 1)  Please schedule a follow-up appointment in 3 months. 2)  Hepatic Panel; CPK prior to visit, ICD-9:995.20 3)  Lipid Panel prior to visit, ICD-9:272.4 Prescriptions: SIMVASTATIN 10 MG TABS (SIMVASTATIN) 1 at bedtime  #90 x 0   Entered and Authorized by:   Unice Cobble MD   Signed by:   Unice Cobble MD on 01/01/2010   Method used:   Faxed to ...       [facility] (retail)       [address] [place], [place]  [address]       Ph: [phone] or [phone]  Fax: [phone]   RxIDUQ:[id number]

## 2010-07-24 NOTE — Letter (Signed)
Summary: Results Follow-up Letter  [facility]   [place], [place] [address]   Phone: [phone]  Fax: [phone]    08/17/2007        [address] [place], [place]  [address]  Dear Ms. Mayorquin,   The following are the results of your recent test(s):  Test     Result     Pap Smear    Normal_______  Not Normal_____       Comments: _________________________________________________________ Cholesterol LDL(Bad cholesterol):          Your goal is less than:         HDL (Good cholesterol):        Your goal is more than: _________________________________________________________ Other Tests:   _________________________________________________________  Please call for an appointment Or _NEGATIVE HEMOCCULT CARDS.________________________________________________________ _________________________________________________________ _________________________________________________________  Sincerely,  Carley Hammed [place] [facility]

## 2010-07-24 NOTE — Progress Notes (Signed)
Summary: refill meclizine  Phone Note Refill Request Message from:  Fax from Pharmacy on May 15, 2010 4:27 PM  Refills Requested: Medication #1:  MECLIZINE HCL 25 MG  TABS 1 tab once daily  Medication #2:  ULTRAM 50 MG  TABS 1/2-1 by mouth once daily as needed prescription solutions - fax [phone] -- phone [phone]  Initial call taken by: Arbie [place],  May 15, 2010 4:28 PM    Prescriptions: ULTRAM 50 MG  TABS (TRAMADOL HCL) 1/2-1 by mouth once daily as needed  #90 x 1   Entered by:   Georgette Dover CMA   Authorized by:   Unice Cobble MD   Signed by:   Georgette Dover CMA on 05/15/2010   Method used:   Print then Give to Patient   RxID:   [id number] MECLIZINE HCL 25 MG  TABS (MECLIZINE HCL) 1 tab once daily  #90 x 1   Entered by:   Georgette Dover CMA   Authorized by:   Unice Cobble MD   Signed by:   Georgette Dover CMA on 05/15/2010   Method used:   Print then Give to Patient   RxID:   [id number]

## 2010-07-24 NOTE — Assessment & Plan Note (Signed)
Summary: [facility]   Vital Signs:  Patient Profile:   70 Years Old Female Height:     66.75 inches Weight:      132.8 pounds Temp:     98.1 degrees F oral Pulse rate:   64 / minute Resp:     16 per minute BP sitting:   122 / 76  (left arm) Cuff size:   regular  Vitals Entered By: Georgette Dover (August 15, 2008 9:21 AM)             Comments PATIENT HAD BOTH FLU VACCINES THIS SEASON.Georgette Dover  August 15, 2008 9:28 AM      Chief Complaint:  YEARLY CHECK UP AND FASTING LABS .  History of Present Illness: No concerns except "BP erratic @ times". BP @ home averages 140/90 or < (from memory). Increased stress; grandson had fulminant liver failure from [place]. Now home from [place], [place]. Her granddaughter died @ same age of Ewing's Sarcoma.  Hypertension History:      She complains of palpitations, but denies headache, chest pain, dyspnea with exertion, orthopnea, PND, peripheral edema, neurologic problems, syncope, and side effects from treatment.  She notes no problems with any antihypertensive medication side effects.  Further comments include: Dr Melvern Banker evaluated palpitations; no Rx, F/U in 6 months. First of 2 cataract surgeries this month.        Positive major cardiovascular risk factors include female age 80 years old or older, hyperlipidemia, hypertension, and family history for ischemic heart disease (males less than 7 years old).  Negative major cardiovascular risk factors include no history of diabetes and non-tobacco-user status.        Positive history for target organ damage include peripheral vascular disease.  Further assessment for target organ damage reveals no history of ASHD or stroke/TIA.       Current Allergies (reviewed today): ! PENICILLIN ! [facility] ! PAXIL ! ASA ! LOVASTATIN ! CODEINE ! * BENAZEPRIL  Past Medical History:    Diverticulosis, colon    Hypothyroidism    Hyperlipidemia    Hypertension   microscopic hematuria    basal cell carcinoma    degenerative joint disease    depression    peripheral vascular disease of ICA bilat, Dr [name]    Peripheral neuropathy, compressive  Past Surgical History:    Hysterectomy age 10 due to dysfunctional menses; HRT X 25-30 years    BSO  prophylactically ( sister had ovarian CA)    Appendectomy    Cholecystectomy    Tonsillectomy    Orthopedic surgeries (elbows, shoulder surgery X 3, right hand)    Right carpal tunnel (1989)    Toe nail removed    Bunionectomy right foot    Colonoscopy- tics (2001),  Colonoscopy & Endoscopy (2006)    Basal cell  LLE (03/2006)    Cataract extraction 07/2008   Family History:    Father: MI age 14    Mother: Colon CA,  CVA, nephrectomy ? reason (died in 38's)    Siblings: 57 sisters--1 w/ovarian CA, kidney disease (died)                                   1 w/AV valve, aortic root  1 w/MV replacement                  6 brothers--2 w/CAD, both deceased                                      1 died @ early age of heart problems                                      2 w/AV replacement                                      1 w/ AV disease                                      1 w/alcoholism, liver disease secondary to this;  M uncle bone CA  Social History:    Never Smoked    Alcohol use-no    Low cholesterol diet    Regular exercise-yes   Risk Factors:  Exercise:  yes   Review of Systems  General      Denies fatigue and weight loss.  ENT      Denies difficulty swallowing and hoarseness.  CV      Carotid Dopplers q 6 mos by Dr [name] (60-79% blockage)  GI      Denies bloody stools, constipation, dark tarry stools, and diarrhea.  MS      Complains of joint pain.      Ultram from Dr Carmelina Paddock      Denies changes in nail beds, dryness, and hair loss.  Neuro      Denies disturbances in coordination, numbness, poor balance, and tingling.  Psych       See HPI      Complains of anxiety, depression, and easily tearful.      Denies easily angered and irritability.      Lorazepam as needed   Endo      Complains of cold intolerance.      Denies heat intolerance.   Physical Exam  General:     in no acute distress; alert,appropriate and cooperative throughout examination; thin Head:     Normocephalic and atraumatic without obvious abnormalities.  Neck:     No deformities, masses, or tenderness noted. Lungs:     Normal respiratory effort, chest expands symmetrically. Lungs are clear to auscultation, no crackles or wheezes. BS decreased Heart:     Normal rate and regular rhythm. S1  normal without gallop, murmur, click, rub. S4 with S2 accentuated Abdomen:     Bowel sounds positive,abdomen soft and non-tender without masses, organomegaly or hernias noted. No AAA Rectal:     Stool cards neg @ Gyn 12/09 Pulses:     R and L carotid,radial,dorsalis pedis and posterior tibial pulses are  equal bilaterally Extremities:     No clubbing, cyanosis, edema. Marked DJD hand changes, or deformity noted with normal full range of motion of all joints.   Neurologic:     alert & oriented X3 and DTRs symmetrical and normal.   Skin:     Intact without suspicious lesions or rashes Cervical Nodes:  No lymphadenopathy noted Axillary Nodes:     No palpable lymphadenopathy Psych:     memory intact for recent and remote, normally interactive, good eye contact, not anxious appearing, and not depressed appearing.      Impression & Recommendations:  Problem # 1:  HYPOTHYROIDISM (ICD-244.9)  Her updated medication list for this problem includes:    Levoxyl 88 Mcg Tabs (Levothyroxine sodium) .Marland Kitchen... 1 tab daily except tuesday and saturdaytake 1/2 tab  Orders: Venipuncture [id number]) TLB-TSH (Thyroid Stimulating Hormone) (84443-TSH)   Problem # 2:  HYPERTENSION, ESSENTIAL NOS (ICD-401.9)  Her updated medication list for this problem includes:     Lopressor 50 Mg Tabs (Metoprolol tartrate) .Marland Kitchen... 1/2  two times a day    Benicar 40 Mg Tabs (Olmesartan medoxomil) .Marland Kitchen... 1daily  Orders: Venipuncture [id number]) TLB-BMP (Basic Metabolic Panel-BMET) (99991111)   Problem # 3:  HYPERLIPIDEMIA (B2193296.2)  Her updated medication list for this problem includes:    Pravastatin Sodium 40 Mg Tabs (Pravastatin sodium) .Marland Kitchen... 1 by mouth qd  Orders: Venipuncture [id number]) TLB-Lipid Panel (80061-LIPID) TLB-Hepatic/Liver Function Pnl (80076-HEPATIC)   Problem # 4:  OSTEOPOROSIS (ICD-733.00)  Her updated medication list for this problem includes:    Fosamax 70 Mg Tabs (Alendronate sodium) .Marland Kitchen... 1 tab weekly    Vitamin D 1000 Unit Tabs (Cholecalciferol) .Marland Kitchen... 1 by mouth once daily  Orders: Venipuncture [id number]) T-Vitamin D (25-Hydroxy) [id number])   Problem # 5:  Hx of DEPRESSION (ICD-311) Situational Her updated medication list for this problem includes:    Lorazepam 0.5 Mg Tabs (Lorazepam) .Marland Kitchen... 1 tab bid   Problem # 6:  DIVERTICULOSIS, COLON (ICD-562.10)  Orders: TLB-CBC Platelet - w/Differential (85025-CBCD)   Complete Medication List: 1)  Lopressor 50 Mg Tabs (Metoprolol tartrate) .... 1/2  two times a day 2)  Lorazepam 0.5 Mg Tabs (Lorazepam) .Marland Kitchen.. 1 tab bid 3)  Pravastatin Sodium 40 Mg Tabs (Pravastatin sodium) .Marland Kitchen.. 1 by mouth qd 4)  Benicar 40 Mg Tabs (Olmesartan medoxomil) .Marland Kitchen.. 1daily 5)  Restasis Emul (Cyclosporine emul) .... Two times a day 6)  Levoxyl 88 Mcg Tabs (Levothyroxine sodium) .Marland Kitchen.. 1 tab daily except tuesday and saturdaytake 1/2 tab 7)  Meclizine Hcl 25 Mg Tabs (Meclizine hcl) .Marland Kitchen.. 1 tab once daily 8)  Celebrex 200 Mg Caps (Celecoxib) .Marland Kitchen.. 1 to 2 tabs daily 9)  Fosamax 70 Mg Tabs (Alendronate sodium) .Marland Kitchen.. 1 tab weekly 10)  Centrum Silver Tabs (Multiple vitamins-minerals) .... Take 1 tablet by mouth once a day 11)  B Complex Tabs (B complex vitamins) .... As directed 12)  Caltrate W/ Vitamin D  .... As  directed 13)  Baby Aspirin 81 Mg Chew (Aspirin) .... Take 1 tablet by mouth once a day as directed 14)  Vitamin E  .... As directed 15)  Ultram 50 Mg Tabs (Tramadol hcl) .... 1/2-1 by mouth once daily as needed 16)  Vitamin D 1000 Unit Tabs (Cholecalciferol) .Marland Kitchen.. 1 by mouth once daily  Hypertension Assessment/Plan:      The patient's hypertensive risk group is category C: Target organ damage and/or diabetes.  Her calculated 10 year risk of coronary heart disease is 5 %.  Today's blood pressure is 122/76.     Patient Instructions: 1)  Inform drug coverage plan you are INTOLERANT to ACE-I agents.   Prescriptions: FOSAMAX 70 MG  TABS (ALENDRONATE SODIUM) 1 tab weekly  #12 x 3   Entered and Authorized by:   Unice Cobble MD   Signed by:   Unice Cobble MD on  08/15/2008   Method used:   Print then Give to Patient   RxID:   [id number] MECLIZINE HCL 25 MG  TABS (MECLIZINE HCL) 1 tab once daily  #30 x 5   Entered and Authorized by:   Unice Cobble MD   Signed by:   Unice Cobble MD on 08/15/2008   Method used:   Print then Give to Patient   RxID:   [id number] LEVOXYL 88 MCG  TABS (LEVOTHYROXINE SODIUM) 1 tab daily except tuesday and saturdaytake 1/2 tab  #90 x 3   Entered and Authorized by:   Unice Cobble MD   Signed by:   Unice Cobble MD on 08/15/2008   Method used:   Print then Give to Patient   RxID:   [id number] PRAVASTATIN SODIUM 40 MG  TABS (PRAVASTATIN SODIUM) 1 by mouth qd  #90 x 3   Entered and Authorized by:   Unice Cobble MD   Signed by:   Unice Cobble MD on 08/15/2008   Method used:   Print then Give to Patient   RxID:   [id number] LOPRESSOR 50 MG  TABS (METOPROLOL TARTRATE) 1/2  two times a day  #90 x 3   Entered and Authorized by:   Unice Cobble MD   Signed by:   Unice Cobble MD on 08/15/2008   Method used:   Print then Give to Patient   RxID:   [id number]

## 2010-07-24 NOTE — Progress Notes (Signed)
Summary: Appointment Requested Per Dr.Hopper  Phone Note Outgoing Call Call back at [facility] Phone [phone]   Call placed by: Georgette Dover,  May 16, 2009 10:50 AM Call placed to: Patient Summary of Call: Message left on voicemail informing patient to call the office to schedule follow-up appointment to discuss labs (Per Dr.Hopper).Georgette Dover  May 16, 2009 10:52 AM

## 2010-07-24 NOTE — Progress Notes (Signed)
Summary: Triage: ?? Reaction to Zetia  Phone Note Call from Patient Call back at [facility] Phone [phone]   Caller: Patient Summary of Call: Message left on VM: Patient taking Zetia since last OV, Patient is now with cramps in feet hands and legs (mainly at night), Patient not sure what to do. Patient states that she has had problems with chlosterol meds in the past  Initial call taken by: Georgette Dover,  December 13, 2009 2:28 PM  Follow-up for Phone Call        Zetia does not affect joints ;statins @ high doses(Ex 80 mg) might cause muscle issues. Zetia blocks cholesterol absorption from gut &  may temporarily affect bowels when first started . Electrolyte, vit D def or chemistry abnormality most likely cause of symptoms. OVINB with Cal/Mag  1 pillat bedtime  Follow-up by: Unice Cobble MD,  December 13, 2009 4:26 PM  Additional Follow-up for Phone Call Additional follow up Details #1::        Spoke with patient, patient ok'd all information and said she will wait and see if symptoms improve and she is taking Calcium already. Patient to call for an appointment if no improvement Additional Follow-up by: Georgette Dover,  December 13, 2009 4:42 PM

## 2010-07-24 NOTE — Letter (Signed)
SummarySadie Shields IM @ [facility] IM @ [facility]s   Imported By: Deborah Shields 10/24/2009 08:48:20  _____________________________________________________________________  External Attachment:    Type:   Image     Comment:   External Document

## 2010-07-24 NOTE — Letter (Signed)
Summary: [facility]   Imported By: Edmonia James 03/22/2010 09:54:30  _____________________________________________________________________  External Attachment:    Type:   Image     Comment:   External Document

## 2010-07-24 NOTE — Letter (Signed)
Summary: [facility]   Imported By: Velora Heckler 08/27/2007 09:41:31  _____________________________________________________________________  External Attachment:    Type:   Image     Comment:   External Document

## 2010-07-24 NOTE — Assessment & Plan Note (Signed)
Summary: CPX/ALR/pt rescd from bump//ccm   Vital Signs:  Patient profile:   70 year old female Height:      66.25 inches Weight:      134.4 pounds Temp:     98.1 degrees F oral Pulse rate:   64 / minute Resp:     16 per minute BP sitting:   120 / 78  (left arm) Cuff size:   regular  Vitals Entered By: Georgette Dover (August 16, 2009 11:38 AM) CC: CPX and fasting labs : Discuss if patient should restart chlosterol med, patient stopped Fosamax on her own. Patient increase Ultram to 3-4 daily for Arthritis pain(Please address) Comments REVIEWED MED LIST, PATIENT AGREED DOSE AND INSTRUCTION CORRECT    Primary Care Provider:  [facility]  CC:  CPX and fasting labs : Discuss if patient should restart chlosterol med and patient stopped Fosamax on her own. Patient increase Ultram to 3-4 daily for Arthritis pain(Please address).  History of Present Illness: Deborah Shields is here for an active issue & med refills.She is having intermittent muscle cramps in legs ONLY   below knees since last visit. These symptoms have progressed off the  statin. Occasionally drawing of fingers is also noted. In 04/2009 TSH & vitamin D were normal, but CPK rose from 543 to 583 over 2 weeks off statin. Salt & mustard  "remedies " recommended by friends were of no benfit; bath oil with Eucalyptus & Spearmint  topically relieves it.  Allergies: 1)  ! Penicillin 2)  ! Wellbutrin 3)  ! Paxil 4)  ! Asa 5)  ! Lovastatin 6)  ! Codeine 7)  ! * Benazepril  Past History:  Past Medical History: Diverticulosis, colon Hypothyroidism Hyperlipidemia Hypertension microscopic hematuria, PMH of basal cell carcinoma,Dr Houston degenerative joint disease peripheral vascular disease of ICA bilat, Dr Ruta Hinds, [facility] Peripheral neuropathy, compressive  Past Surgical History: Hysterectomy age 49 due to dysfunctional menses; HRT X 25-30 years BSO  prophylactically ( sister had ovarian  CA) Appendectomy Cholecystectomy Tonsillectomy Orthopedic surgeries (elbows, shoulder surgery X 3, right hand) Right carpal tunnel (1989) Toe nail removed Bunionectomy right foot Colonoscopy-Diverticulosis 2001,  Colonoscopy & Endoscopy  neg  except Tics 2006 Basal cell  LLE (03/2006) Cataract extraction, bilaterally  2010  Review of Systems General:  Denies fatigue and weight loss. Eyes:  Denies blurring, double vision, and vision loss-both eyes. ENT:  Denies difficulty swallowing and hoarseness. CV:  Denies chest pain or discomfort, leg cramps with exertion, palpitations, shortness of breath with exertion, swelling of feet, and swelling of hands. Resp:  Denies cough, shortness of breath, and sputum productive. GI:  Denies abdominal pain, bloody stools, dark tarry stools, and indigestion. GU:  Denies discharge, dysuria, and hematuria. MS:  Complains of joint pain, joint swelling, loss of strength, and low back pain; denies joint redness, mid back pain, and thoracic pain; Dr Justine Null Rxed Celebrex two times a day; no F/U due to his illness. She plans to switch to Dr Trudie Reed. Derm:  Denies changes in nail beds, dryness, hair loss, lesion(s), and rash. Neuro:  Complains of numbness and tingling; N&T in hands intermittently with activity. Psych:  Denies anxiety and depression. Endo:  Denies cold intolerance, excessive hunger, excessive thirst, excessive urination, and heat intolerance. Heme:  Denies abnormal bruising and bleeding.  Physical Exam  General:  Thin,in no acute distress; alert,appropriate and cooperative throughout examination Head:  Normocephalic and atraumatic without obvious abnormalities. No apparent alopecia  Eyes:  No corneal or conjunctival inflammation  noted. Perrla.No lid lag Neck:  No deformities, masses, or tenderness noted. Lungs:  Normal respiratory effort, chest expands symmetrically. Lungs are clear to auscultation, no crackles or wheezes. Heart:  Normal rate and  regular rhythm. S1 and S2 normal without gallop, murmur, click, rub . S4 Abdomen:  Bowel sounds positive,abdomen soft and non-tender without masses, organomegaly or hernias noted. Msk:  No deformity or scoliosis noted of thoracic or lumbar spine.   Pulses:  R and L carotid,radial  pulses are full and equal bilaterally. Bowel sounds obscure femoral auscultation Extremities:  No clubbing, cyanosis, edema. Marked OA hand changes. VV of feet. Absent great toenails Neurologic:  alert & oriented X3, strength normal in all extremities, and DTRs symmetrical and normal.   Cervical Nodes:  No lymphadenopathy noted Axillary Nodes:  No palpable lymphadenopathy Psych:  memory intact for recent and remote, normally interactive, and good eye contact.     Impression & Recommendations:  Problem # 1:  MUSCLE PAIN (ICD-729.1)  Her updated medication list for this problem includes:    Celebrex 200 Mg Caps (Celecoxib) .Marland Kitchen... 1 to 2 tabs daily    Baby Aspirin 81 Mg Chew (Aspirin) .Marland Kitchen... Take 1 tablet by mouth once a day as directed    Ultram 50 Mg Tabs (Tramadol hcl) .Marland Kitchen... 1/2-1 by mouth once daily as needed  Orders: Venipuncture [id number]) TLB-CK Total Only(Creatine Kinase/CPK) (82550-CK) TLB-Magnesium (Mg) (83735-MG) TLB-Sedimentation Rate (ESR) (85652-ESR) Rheumatology Referral (Rheumatology)  Problem # 2:  HYPOTHYROIDISM (ICD-244.9)  Her updated medication list for this problem includes:    Levoxyl 88 Mcg Tabs (Levothyroxine sodium) .Marland Kitchen... 1 tab daily except tuesday and saturdaytake 1/2 tab  Orders: Venipuncture [id number]) TLB-TSH (Thyroid Stimulating Hormone) (84443-TSH)  Problem # 3:  HYPERLIPIDEMIA (ICD-272.2)  The following medications were removed from the medication list:    Pravastatin Sodium 40 Mg Tabs (Pravastatin sodium) .Marland Kitchen... 1 by mouth qd  Orders: Venipuncture [id number]) T-NMR, Lipoprofile [id number])  Problem # 4:  HYPERTENSION, ESSENTIAL NOS (ICD-401.9)  Her updated medication list  for this problem includes:    Lopressor 50 Mg Tabs (Metoprolol tartrate) .Marland Kitchen... 1/2  two times a day    Azor 5-40 Mg Tabs (Amlodipine-olmesartan) .Marland Kitchen... 1 by mouth qd  Orders: Venipuncture [id number]) TLB-BMP (Basic Metabolic Panel-BMET) (99991111)  Problem # 5:  DEGENERATIVE JOINT DISEASE (ICD-715.90)  Advanced. She desires to be seen by a new Rheumatologist due to Dr Tanna Furry imminent retirement Her updated medication list for this problem includes:    Celebrex 200 Mg Caps (Celecoxib) .Marland Kitchen... 1 to 2 tabs daily    Baby Aspirin 81 Mg Chew (Aspirin) .Marland Kitchen... Take 1 tablet by mouth once a day as directed    Ultram 50 Mg Tabs (Tramadol hcl) .Marland Kitchen... 1/2-1 by mouth once daily as needed  Orders: Venipuncture [id number]) Rheumatology Referral (Rheumatology)  Complete Medication List: 1)  Lopressor 50 Mg Tabs (Metoprolol tartrate) .... 1/2  two times a day 2)  Lorazepam 0.5 Mg Tabs (Lorazepam) .Marland Kitchen.. 1 tab bid 3)  Azor 5-40 Mg Tabs (Amlodipine-olmesartan) .Marland Kitchen.. 1 by mouth qd 4)  Restasis Emul (Cyclosporine emul) .... Two times a day 5)  Levoxyl 88 Mcg Tabs (Levothyroxine sodium) .Marland Kitchen.. 1 tab daily except tuesday and saturdaytake 1/2 tab 6)  Meclizine Hcl 25 Mg Tabs (Meclizine hcl) .Marland Kitchen.. 1 tab once daily 7)  Celebrex 200 Mg Caps (Celecoxib) .Marland Kitchen.. 1 to 2 tabs daily 8)  Centrum Silver Tabs (Multiple vitamins-minerals) .... Take 1 tablet by mouth once a day 9)  B Complex Tabs (B  complex vitamins) .... As directed 10)  Caltrate W/ Vitamin D  .... As directed 11)  Baby Aspirin 81 Mg Chew (Aspirin) .... Take 1 tablet by mouth once a day as directed 12)  Vitamin E  .... As directed 13)  Ultram 50 Mg Tabs (Tramadol hcl) .... 1/2-1 by mouth once daily as needed 14)  Vitamin D 1000 Unit Tabs (Cholecalciferol) .Marland Kitchen.. 1 by mouth once daily  Other Orders: TLB-CBC Platelet - w/Differential (85025-CBCD) TLB-Hepatic/Liver Function Pnl (80076-HEPATIC)  Patient Instructions: 1)  Rheumatology referral will be scheduled.  Further recommendations will depend on lab results. Prescriptions: MECLIZINE HCL 25 MG  TABS (MECLIZINE HCL) 1 tab once daily  #90 x 0   Entered and Authorized by:   Unice Cobble MD   Signed by:   Unice Cobble MD on 08/16/2009   Method used:   Print then Give to Patient   RxID:   [id number] ULTRAM 50 MG  TABS (TRAMADOL HCL) 1/2-1 by mouth once daily as needed  #90 x 1   Entered and Authorized by:   Unice Cobble MD   Signed by:   Unice Cobble MD on 08/16/2009   Method used:   Print then Give to Patient   RxID:   [id number] CELEBREX 200 MG  CAPS (CELECOXIB) 1 to 2 tabs daily  #180 x 0   Entered and Authorized by:   Unice Cobble MD   Signed by:   Unice Cobble MD on 08/16/2009   Method used:   Print then Give to Patient   RxID:   [id number] LEVOXYL 88 MCG  TABS (LEVOTHYROXINE SODIUM) 1 tab daily except tuesday and saturdaytake 1/2 tab  #90 x 3   Entered and Authorized by:   Unice Cobble MD   Signed by:   Unice Cobble MD on 08/16/2009   Method used:   Print then Give to Patient   RxID:   [id number] LORAZEPAM 0.5 MG  TABS (LORAZEPAM) 1 tab BID  #180 x 1   Entered and Authorized by:   Unice Cobble MD   Signed by:   Unice Cobble MD on 08/16/2009   Method used:   Print then Give to Patient   RxID:   [id number] LOPRESSOR 50 MG  TABS (METOPROLOL TARTRATE) 1/2  two times a day  #90 x 3   Entered and Authorized by:   Unice Cobble MD   Signed by:   Unice Cobble MD on 08/16/2009   Method used:   Print then Give to Patient   RxID:   [id number]

## 2010-07-24 NOTE — Assessment & Plan Note (Signed)
Summary: Elevated B/P   Vital Signs:  Patient profile:   70 year old female Height:      66.75 inches Weight:      135 pounds BMI:     21.38 Temp:     98.2 degrees F oral Pulse rate:   66 / minute Pulse rhythm:   regular BP sitting:   142 / 86  (left arm) Cuff size:   regular  Vitals Entered By: Allyn Kenner CMA (April 07, 2009 3:59 PM) CC: BP elevated.    History of Present Illness: Pt here because bp has been running high and she has had headaches and feels a little dizzy.  No CPor sob.  This has been going on for a few weeks.    Current Medications (verified): 1)  Lopressor 50 Mg  Tabs (Metoprolol Tartrate) .... 1/2  Two Times A Day 2)  Lorazepam 0.5 Mg  Tabs (Lorazepam) .Marland Kitchen.. 1 Tab Bid 3)  Pravastatin Sodium 40 Mg  Tabs (Pravastatin Sodium) .Marland Kitchen.. 1 By Mouth Qd 4)  Azor 5-40 Mg Tabs (Amlodipine-Olmesartan) .Marland Kitchen.. 1 By Mouth Qd 5)  Restasis   Emul (Cyclosporine Emul) .... Two Times A Day 6)  Levoxyl 88 Mcg  Tabs (Levothyroxine Sodium) .Marland Kitchen.. 1 Tab Daily Except Tuesday and Saturdaytake 1/2 Tab 7)  Meclizine Hcl 25 Mg  Tabs (Meclizine Hcl) .Marland Kitchen.. 1 Tab Once Daily 8)  Celebrex 200 Mg  Caps (Celecoxib) .Marland Kitchen.. 1 To 2 Tabs Daily 9)  Fosamax 70 Mg  Tabs (Alendronate Sodium) .Marland Kitchen.. 1 Tab Weekly 10)  Centrum Silver   Tabs (Multiple Vitamins-Minerals) .... Take 1 Tablet By Mouth Once A Day 11)  B Complex   Tabs (B Complex Vitamins) .... As Directed 12)  Caltrate W/ Vitamin D .... As Directed 13)  Baby Aspirin 81 Mg  Chew (Aspirin) .... Take 1 Tablet By Mouth Once A Day As Directed 14)  Vitamin E .... As Directed 15)  Ultram 50 Mg  Tabs (Tramadol Hcl) .... 1/2-1 By Mouth Once Daily As Needed 16)  Vitamin D 1000 Unit Tabs (Cholecalciferol) .Marland Kitchen.. 1 By Mouth Once Daily  Allergies (verified): 1)  ! Penicillin 2)  ! Wellbutrin 3)  ! Paxil 4)  ! Asa 5)  ! Lovastatin 6)  ! Codeine 7)  ! * Benazepril  Past History:  Past medical, surgical, family and social histories (including risk  factors) reviewed for relevance to current acute and chronic problems.  Past Medical History: Reviewed history from 08/15/2008 and no changes required. Diverticulosis, colon Hypothyroidism Hyperlipidemia Hypertension microscopic hematuria basal cell carcinoma degenerative joint disease depression peripheral vascular disease of ICA bilat, Dr [name] Peripheral neuropathy, compressive  Past Surgical History: Reviewed history from 08/15/2008 and no changes required. Hysterectomy age 54 due to dysfunctional menses; HRT X 25-30 years BSO  prophylactically ( sister had ovarian CA) Appendectomy Cholecystectomy Tonsillectomy Orthopedic surgeries (elbows, shoulder surgery X 3, right hand) Right carpal tunnel (1989) Toe nail removed Bunionectomy right foot Colonoscopy- tics (2001),  Colonoscopy & Endoscopy (2006) Basal cell  LLE (03/2006) Cataract extraction 07/2008  Family History: Reviewed history from 08/15/2008 and no changes required. Father: MI age 16 Mother: Colon CA,  CVA, nephrectomy ? reason (died in 33's) Siblings: 59 sisters--1 w/ovarian CA, kidney disease (died)                                1 w/AV valve, aortic root  1 w/MV replacement               6 brothers--2 w/CAD, both deceased                                   1 died @ early age of heart problems                                   2 w/AV replacement                                   1 w/ AV disease                                   1 w/alcoholism, liver disease secondary to this;  M uncle bone CA  Social History: Reviewed history from 08/15/2008 and no changes required. Never Smoked Alcohol use-no Low cholesterol diet Regular exercise-yes  Review of Systems      See HPI  Physical Exam  General:  Well-developed,well-nourished,in no acute distress; alert,appropriate and cooperative throughout examination Neck:  No deformities, masses, or tenderness noted. Lungs:  Normal  respiratory effort, chest expands symmetrically. Lungs are clear to auscultation, no crackles or wheezes. Heart:  normal rate and no murmur.   Neurologic:  alert & oriented X3 and gait normal.   Psych:  Oriented X3 and normally interactive.     Impression & Recommendations:  Problem # 1:  HYPERTENSION, ESSENTIAL NOS (ICD-401.9)  Her updated medication list for this problem includes:    Lopressor 50 Mg Tabs (Metoprolol tartrate) .Marland Kitchen... 1/2  two times a day    Azor 5-40 Mg Tabs (Amlodipine-olmesartan) .Marland Kitchen... 1 by mouth qd  Orders: EKG w/ Interpretation (93000)  BP today: 142/86 Prior BP: 122/76 (08/15/2008)  Prior 10 Yr Risk Heart Disease: 5 % (08/15/2008)  Labs Reviewed: K+: 3.9 (08/15/2008) Creat: : 1.3 (08/15/2008)   Chol: 193 (08/15/2008)   HDL: 70.6 (08/15/2008)   LDL: 107 (08/15/2008)   TG: 76 (08/15/2008)  Complete Medication List: 1)  Lopressor 50 Mg Tabs (Metoprolol tartrate) .... 1/2  two times a day 2)  Lorazepam 0.5 Mg Tabs (Lorazepam) .Marland Kitchen.. 1 tab bid 3)  Pravastatin Sodium 40 Mg Tabs (Pravastatin sodium) .Marland Kitchen.. 1 by mouth qd 4)  Azor 5-40 Mg Tabs (Amlodipine-olmesartan) .Marland Kitchen.. 1 by mouth qd 5)  Restasis Emul (Cyclosporine emul) .... Two times a day 6)  Levoxyl 88 Mcg Tabs (Levothyroxine sodium) .Marland Kitchen.. 1 tab daily except tuesday and saturdaytake 1/2 tab 7)  Meclizine Hcl 25 Mg Tabs (Meclizine hcl) .Marland Kitchen.. 1 tab once daily 8)  Celebrex 200 Mg Caps (Celecoxib) .Marland Kitchen.. 1 to 2 tabs daily 9)  Fosamax 70 Mg Tabs (Alendronate sodium) .Marland Kitchen.. 1 tab weekly 10)  Centrum Silver Tabs (Multiple vitamins-minerals) .... Take 1 tablet by mouth once a day 11)  B Complex Tabs (B complex vitamins) .... As directed 12)  Caltrate W/ Vitamin D  .... As directed 13)  Baby Aspirin 81 Mg Chew (Aspirin) .... Take 1 tablet by mouth once a day as directed 14)  Vitamin E  .... As directed 15)  Ultram 50 Mg Tabs (Tramadol hcl) .... 1/2-1 by mouth once daily as needed 16)  Vitamin D  1000 Unit Tabs  (Cholecalciferol) .Marland Kitchen.. 1 by mouth once daily  Patient Instructions: 1)  rto 2-3 weeks f/u with [facility]   EKG  Procedure date:  04/07/2009  Findings:      Normal sinus rhythm with rate of:  61 bpm

## 2010-07-24 NOTE — Letter (Signed)
Summary: Results Follow-up Letter  [facility] [place]   [place], [place] [address]   Phone: [phone]  Fax: [phone]    08/13/2007        [address] [place], [place]  [address]  Dear Ms. Knecht,   The following are the results of your recent test(s):  Test     Result     Pap Smear    Normal_______  Not Normal_____       Comments: _________________________________________________________ Cholesterol LDL(Bad cholesterol):          Your goal is less than:         HDL (Good cholesterol):        Your goal is more than: _________________________________________________________ Other Tests:   _________________________________________________________  Please call for an appointment Or _________________________________________________________ _________________________________________________________ _________________________________________________________  Sincerely,  Carley Hammed [place] [facility]

## 2010-07-24 NOTE — Letter (Signed)
Summary: [facility]s   Imported By: Edmonia James 06/13/2008 12:53:36  _____________________________________________________________________  External Attachment:    Type:   Image     Comment:   External Document

## 2010-07-24 NOTE — Assessment & Plan Note (Signed)
Summary: 3 week fu/ns/kdc   Vital Signs:  Patient profile:   70 year old female Weight:      134.6 pounds Temp:     98.2 degrees F oral Pulse rate:   72 / minute Resp:     17 per minute BP sitting:   110 / 62  (left arm)  Vitals Entered By: Georgette Dover (May 01, 2009 3:03 PM) CC: 1.) B/P follow-up  2.) Urgency to urinate  3.)Complete form  4.)Leg/hand cramps  5.) Discuss Done Density, Hypertension Management Comments REVIEWED MED LIST, PATIENT AGREED DOSE AND INSTRUCTION CORRECT    Primary Care Provider:  Linus Orn  CC:  1.) B/P follow-up  2.) Urgency to urinate  3.)Complete form  4.)Leg/hand cramps  5.) Discuss Done Density and Hypertension Management.  History of Present Illness: 04/07/2009 OV reviewed ; since then BP essentially sa me as today.  Hypertension History:      She denies headache, chest pain, palpitations, dyspnea with exertion, orthopnea, PND, peripheral edema, visual symptoms, neurologic problems, syncope, and side effects from treatment.  She notes no problems with any antihypertensive medication side effects.        Positive major cardiovascular risk factors include female age 46 years old or older, hyperlipidemia, hypertension, and family history for ischemic heart disease (males less than 45 years old).  Negative major cardiovascular risk factors include no history of diabetes and non-tobacco-user status.        Positive history for target organ damage include peripheral vascular disease.  Further assessment for target organ damage reveals no history of ASHD or stroke/TIA.     Allergies: 1)  ! Penicillin 2)  ! Wellbutrin 3)  ! Paxil 4)  ! Asa 5)  ! Lovastatin 6)  ! Codeine 7)  ! * Benazepril  Review of Systems MS:  Complains of muscle aches and cramps; OTC ointment helps. Endo:  Complains of weight change; weight up 10-12 #; Marland Kitchen  Physical Exam  General:  Appears younger than age,in no acute distress; alert,appropriate and cooperative throughout  examination Lungs:  Normal respiratory effort, chest expands symmetrically. Lungs are clear to auscultation, no crackles or wheezes. Heart:  normal rate and regular rhythm.  S4 ; occa pause Pulses:  R and L carotid,radial,dorsalis pedis and posterior tibial pulses are full and equal bilaterally Extremities:  No clubbing, cyanosis, edema.   Impression & Recommendations:  Problem # 1:  HYPERTENSION, ESSENTIAL NOS (ICD-401.9) Controlled Her updated medication list for this problem includes:    Lopressor 50 Mg Tabs (Metoprolol tartrate) .Marland Kitchen... 1/2  two times a day    Azor 5-40 Mg Tabs (Amlodipine-olmesartan) .Marland Kitchen... 1 by mouth qd  Problem # 2:  WEIGHT GAIN (ICD-783.1)  Orders: Venipuncture [id number]) TLB-TSH (Thyroid Stimulating Hormone) (84443-TSH)  Problem # 3:  MUSCLE PAIN (ICD-729.1)  Her updated medication list for this problem includes:    Celebrex 200 Mg Caps (Celecoxib) .Marland Kitchen... 1 to 2 tabs daily    Baby Aspirin 81 Mg Chew (Aspirin) .Marland Kitchen... Take 1 tablet by mouth once a day as directed    Ultram 50 Mg Tabs (Tramadol hcl) .Marland Kitchen... 1/2-1 by mouth once daily as needed  Orders: Venipuncture [id number]) TLB-CK Total Only(Creatine Kinase/CPK) (82550-CK) T-Vitamin D (25-Hydroxy) [id number])  Problem # 4:  HYPOTHYROIDISM (ICD-244.9)  Her updated medication list for this problem includes:    Levoxyl 88 Mcg Tabs (Levothyroxine sodium) .Marland Kitchen... 1 tab daily except tuesday and saturdaytake 1/2 tab  Orders: Venipuncture [id number]) TLB-TSH (Thyroid Stimulating Hormone) (84443-TSH)  Complete Medication List: 1)  Lopressor 50 Mg Tabs (Metoprolol tartrate) .... 1/2  two times a day 2)  Lorazepam 0.5 Mg Tabs (Lorazepam) .Marland Kitchen.. 1 tab bid 3)  Pravastatin Sodium 40 Mg Tabs (Pravastatin sodium) .Marland Kitchen.. 1 by mouth qd 4)  Azor 5-40 Mg Tabs (Amlodipine-olmesartan) .Marland Kitchen.. 1 by mouth qd 5)  Restasis Emul (Cyclosporine emul) .... Two times a day 6)  Levoxyl 88 Mcg Tabs (Levothyroxine sodium) .Marland Kitchen.. 1 tab daily except  tuesday and saturdaytake 1/2 tab 7)  Meclizine Hcl 25 Mg Tabs (Meclizine hcl) .Marland Kitchen.. 1 tab once daily 8)  Celebrex 200 Mg Caps (Celecoxib) .Marland Kitchen.. 1 to 2 tabs daily 9)  Fosamax 70 Mg Tabs (Alendronate sodium) .Marland Kitchen.. 1 tab weekly 10)  Centrum Silver Tabs (Multiple vitamins-minerals) .... Take 1 tablet by mouth once a day 11)  B Complex Tabs (B complex vitamins) .... As directed 12)  Caltrate W/ Vitamin D  .... As directed 13)  Baby Aspirin 81 Mg Chew (Aspirin) .... Take 1 tablet by mouth once a day as directed 14)  Vitamin E  .... As directed 15)  Ultram 50 Mg Tabs (Tramadol hcl) .... 1/2-1 by mouth once daily as needed 16)  Vitamin D 1000 Unit Tabs (Cholecalciferol) .Marland Kitchen.. 1 by mouth once daily  Other Orders: UA Dipstick w/o Micro (manual) (81002)  Hypertension Assessment/Plan:      The patient's hypertensive risk group is category C: Target organ damage and/or diabetes.  Her calculated 10 year risk of coronary heart disease is 4 %.  Today's blood pressure is 110/62.    Patient Instructions: 1)  Check your Blood Pressure regularly. If it is above: 135/85 ON AVERAGE  you should make an appointment. Prescriptions: AZOR 5-40 MG TABS (AMLODIPINE-OLMESARTAN) 1 by mouth qd  #90 x 3   Entered and Authorized by:   Unice Cobble MD   Signed by:   Unice Cobble MD on 05/01/2009   Method used:   Printed then faxed to ...       [facility] (mail-order)             , [place]         Ph:        Fax: [phone]   RxID:   [id number]

## 2010-07-24 NOTE — Medication Information (Signed)
Summary: [facility]  [facility]   Imported By: Edmonia James 05/04/2009 08:31:58  _____________________________________________________________________  External Attachment:    Type:   Image     Comment:   External Document

## 2010-07-26 ENCOUNTER — Encounter: Payer: Self-pay | Admitting: Internal Medicine

## 2010-07-26 NOTE — Assessment & Plan Note (Signed)
Summary: Discuss labs/scm   Vital Signs:  Patient profile:   70 year old female Weight:      134.4 pounds Temp:     97.6 degrees F oral Pulse rate:   76 / minute Resp:     16 per minute BP sitting:   126 / 78  (left arm) Cuff size:   large  Vitals Entered By: Georgette Dover CMA (July 06, 2010 3:40 PM) CC: Discuss Labs from another doctor (copy given)    Primary Care Provider:  Linus Orn  CC:  Discuss Labs from another doctor (copy given) .  History of Present Illness:    TFTs on generic Levoxyl 0.088 mg reveal hypothyroid status. Pathophysiology of thyroid supplementation discussed. ROS  + for fatigue, hoarseness.  Allergies: 1)  ! Penicillin 2)  ! Wellbutrin 3)  ! Paxil 4)  ! Asa 5)  ! Lovastatin 6)  ! Codeine 7)  ! * Benazepril 8)  ! Pravachol (Pravastatin Sodium) 9)  ! Zetia (Ezetimibe)  Review of Systems General:  Denies weight loss. Eyes:  Denies blurring, double vision, and vision loss-both eyes. CV:  Complains of palpitations; Occasional palpitations. GI:  Denies constipation and diarrhea. Derm:  Denies changes in nail beds, dryness, and hair loss. Neuro:  Complains of numbness and tingling; N&T in UE. Endo:  Denies cold intolerance and heat intolerance.  Physical Exam  General:  in no acute distress; alert,appropriate and cooperative throughout examination Neck:  No deformities, masses, or tenderness noted. Heart:  normal rate, regular rhythm, no gallop, no rub, no JVD, and grade 1 /6 systolic murmur.   Neurologic:  alert & oriented X3 and DTRs  normal except minimally decreased R knee Skin:  Intact without suspicious lesions or rashes   Impression & Recommendations:  Problem # 1:  HYPOTHYROIDISM (ICD-244.9)  Her updated medication list for this problem includes:    Levoxyl 88 Mcg Tabs (Levothyroxine sodium) .Marland Kitchen... 1 tab daily  except 1& 1/2 on weds  Problem # 2:  HYPERLIPIDEMIA (ICD-272.2)  Complete Medication List: 1)  Lopressor 50 Mg Tabs  (Metoprolol tartrate) .... 1/2  two times a day 2)  Lorazepam 0.5 Mg Tabs (Lorazepam) .Marland Kitchen.. 1 by mouth three times a day as needed only 3)  Restasis Emul (Cyclosporine emul) .... Two times a day 4)  Levoxyl 88 Mcg Tabs (Levothyroxine sodium) .Marland Kitchen.. 1 tab daily  except 1& 1/2 on weds 5)  Meclizine Hcl 25 Mg Tabs (Meclizine hcl) .Marland Kitchen.. 1 tab once daily 6)  Celebrex 200 Mg Caps (Celecoxib) .Marland Kitchen.. 1 to 2 tabs daily 7)  Centrum Silver Tabs (Multiple vitamins-minerals) .... Take 1 tablet by mouth once a day 8)  B Complex Tabs (B complex vitamins) .Marland Kitchen.. 1 by mouth once daily 9)  Calcium 600mg / Vit D 1200  .Marland KitchenMarland Kitchen. 1 by mouth two times a day 10)  Baby Aspirin 81 Mg Chew (Aspirin) .... Take 1 tablet by mouth once a day as directed 11)  Vitamin E  .... 1 by mouth once daily 12)  Ultram 50 Mg Tabs (Tramadol hcl) .... 1/2-1 by mouth once daily as needed 13)  Vitamin D 1000 Unit Tabs (Cholecalciferol) .Marland Kitchen.. 1 by mouth once daily 14)  Losartan Potassium 100 Mg Tabs (Losartan potassium) .Marland Kitchen.. 1 once daily in place of azor 15)  Azithromycin 250 Mg Tabs (Azithromycin) .... As  per  pack 16)  Advair Diskus 100-50 Mcg/dose Aepb (Fluticasone-salmeterol) .Marland Kitchen.. 1 inhalation every 12 hrs ; gargle after use 17)  Benzonatate 100 Mg Caps (  Benzonatate) .Marland Kitchen.. 1 every 6 hrs as needed for cough  Patient Instructions: 1)  Schedule  a fasting [place] [facility] Lipid Panel  (272.4) next week & then a TSH in 8 weeks(244.9):   Orders Added: 1)  Est. Patient Level III [id number]

## 2010-07-26 NOTE — Letter (Signed)
Summary: [facility]  [facility]   Imported By: Edmonia James 06/27/2010 11:35:13  _____________________________________________________________________  External Attachment:    Type:   Image     Comment:   External Document

## 2010-07-26 NOTE — Progress Notes (Signed)
Summary: high thyroid level at Dr Marya Fossa office  Phone Note Call from Patient   Caller: Patient Summary of Call: patient called to let us know to look for fax from Dr Vira Agar Lewit's office ----patient has thyroid level tested in in Dr Marya Fossa office and it is showing high  patient asks that we show fax to Dr Linna Darner and then call her (cell [phone] until middle of afternoon, then call [phone]) to advise her what Dr Linna Darner wants to do  Uses [facility], [place], [place]  for a local pharmacy;  if maintenance, uses [facility]  Has appt with Dr Linna Darner on 2/29---due to Thyroid issues, ok to wait ??    please leave any message on the Home answering machine Initial call taken by: Berneta Sages,  July 05, 2010 9:27 AM  Follow-up for Phone Call        make appt after labs return Follow-up by: Unice Cobble MD,  July 05, 2010 1:12 PM  Additional Follow-up for Phone Call Additional follow up Details #1::        These labs are on counter pending review. Ernestene Mention CMA  July 05, 2010 3:01 PM     Additional Follow-up for Phone Call Additional follow up Details #2::    Spoke with patient and schedule appointment to discuss labs .Georgette Dover CMA  July 05, 2010 4:38 PM

## 2010-07-26 NOTE — Progress Notes (Signed)
Summary: Request for lab results  Phone Note Call from Patient Call back at Home Phone [phone] Call back at [phone]   Caller: Patient Summary of Call: Message left on voicemail: Patient would like lab results  I spoke with patient and informed her Dr.Hopper has to review labs, (FYI) Dr.Lewitt will perform a nerve conduction on 06/19/2010  Georgette Dover CMA  June 13, 2010 10:54 AM   Follow-up for Phone Call        see results  Additional Follow-up for Phone Call Additional follow up Details #1::        Simvastatin was stopped on 04/10/2010, please readvise.  Additional Follow-up by: Georgette Dover CMA,  June 13, 2010 2:43 PM    Additional Follow-up for Phone Call Additional follow up Details #2::    If not taking any cholesterol agents she needs to follow up with her Rheumatologist to assess for an underlying muscle process Follow-up by: Unice Cobble MD,  June 13, 2010 3:20 PM  Additional Follow-up for Phone Call Additional follow up Details #3:: Details for Additional Follow-up Action Taken: I spoke with patient and she indicated she seen Dr.Hawks(Rheumatologist) and was sent to [facility] for Nerve conduction. Georgette Dover CMA  June 13, 2010 3:42 PM    Appended Document: Request for lab results She still should have special heart panel fasting to assess risk. Labs were sent to Dr Lenna Gilford  Appended Document: Request for lab results mailed.

## 2010-07-27 ENCOUNTER — Telehealth (INDEPENDENT_AMBULATORY_CARE_PROVIDER_SITE_OTHER): Payer: Self-pay | Admitting: *Deleted

## 2010-08-01 NOTE — Letter (Signed)
Summary: [facility] By: Edmonia James 07/26/2010 08:14:47  _____________________________________________________________________  External Attachment:    Type:   Image     Comment:   External Document

## 2010-08-01 NOTE — Progress Notes (Signed)
Summary: bp is high --pt concerned  2nd call  Phone Note Call from Patient   Caller: Patient Summary of Call: BLOOD PRESSURE IS RUNNING HIGH---yesterday at another dr's office it was 150/90.  Today it is 171 / 97 heart rate=71  she is very concerned and would like someone to call her as soon as possible at cell = [phone] Initial call taken by: Berneta Sages,  July 27, 2010 1:55 PM  Follow-up for Phone Call        Patient is currently on more that one BP med, please advise. Ernestene Mention CMA  July 27, 2010 2:51 PM   Patient called back stating that she does not think the Losartan works well for her. Patient is aware that we will call her at home with MD recommendations. Please advise. Ernestene Mention CMA  July 27, 2010 3:19 PM   Additional Follow-up for Phone Call Additional follow up Details #1::        Per MD add Amlodipine 5MG  Daily to current meds, continue to monitor B/P Additional Follow-up by: [place],  July 27, 2010 5:03 PM    Additional Follow-up for Phone Call Additional follow up Details #2::    I spoke w/ pt she is aware. Allyn Kenner CMA  July 27, 2010 5:04 PM   New/Updated Medications: NORVASC 5 MG TABS (AMLODIPINE BESYLATE) 1 by mouth daily. Prescriptions: NORVASC 5 MG TABS (AMLODIPINE BESYLATE) 1 by mouth daily.  #30 x 2   Entered by:   Allyn Kenner CMA   Authorized by:   Unice Cobble MD   Signed by:   Allyn Kenner CMA on 07/27/2010   Method used:   Electronically to        [facility] (retail)       [address] [place], [place]  [address]       Ph: [phone] or [phone]       Fax: [phone]   RxID:   [id number]

## 2010-08-07 ENCOUNTER — Encounter (HOSPITAL_BASED_OUTPATIENT_CLINIC_OR_DEPARTMENT_OTHER)
Admission: RE | Admit: 2010-08-07 | Discharge: 2010-08-07 | Disposition: A | Discharge status: Home / Self Care | Payer: Medicare Other | Source: Ambulatory Visit | Attending: Orthopedic Surgery | Admitting: Orthopedic Surgery

## 2010-08-07 ENCOUNTER — Other Ambulatory Visit: Payer: Self-pay | Admitting: Orthopedic Surgery

## 2010-08-07 ENCOUNTER — Ambulatory Visit
Admission: RE | Admit: 2010-08-07 | Discharge: 2010-08-07 | Disposition: A | Discharge status: Home / Self Care | Payer: Medicare Other | Source: Ambulatory Visit | Attending: Orthopedic Surgery | Admitting: Orthopedic Surgery

## 2010-08-07 DIAGNOSIS — Z01811 Encounter for preprocedural respiratory examination: Secondary | ICD-10-CM

## 2010-08-07 DIAGNOSIS — Z01812 Encounter for preprocedural laboratory examination: Secondary | ICD-10-CM | POA: Insufficient documentation

## 2010-08-07 LAB — BASIC METABOLIC PANEL
BUN: 16 mg/dL (ref 6–23)
CO2: 27 mEq/L (ref 19–32)
Calcium: 9.5 mg/dL (ref 8.4–10.5)
Chloride: 103 mEq/L (ref 96–112)
Creatinine, Ser: 1.35 mg/dL — ABNORMAL HIGH (ref 0.4–1.2)
GFR calc Af Amer: 47 mL/min — ABNORMAL LOW (ref >60)
GFR calc non Af Amer: 39 mL/min — ABNORMAL LOW (ref >60)
Glucose, Bld: 102 mg/dL — ABNORMAL HIGH (ref 70–99)
Potassium: 4.6 mEq/L (ref 3.5–5.1)
Sodium: 138 mEq/L (ref 135–145)

## 2010-08-09 ENCOUNTER — Ambulatory Visit (HOSPITAL_BASED_OUTPATIENT_CLINIC_OR_DEPARTMENT_OTHER)
Admission: RE | Admit: 2010-08-09 | Discharge: 2010-08-10 | Disposition: A | Discharge status: Home / Self Care | Payer: Medicare Other | Source: Ambulatory Visit | Attending: Orthopedic Surgery | Admitting: Orthopedic Surgery

## 2010-08-09 DIAGNOSIS — Z01812 Encounter for preprocedural laboratory examination: Secondary | ICD-10-CM | POA: Insufficient documentation

## 2010-08-09 DIAGNOSIS — M9420 Chondromalacia, unspecified site: Secondary | ICD-10-CM | POA: Insufficient documentation

## 2010-08-09 DIAGNOSIS — M67919 Unspecified disorder of synovium and tendon, unspecified shoulder: Secondary | ICD-10-CM | POA: Insufficient documentation

## 2010-08-09 DIAGNOSIS — M25819 Other specified joint disorders, unspecified shoulder: Secondary | ICD-10-CM | POA: Insufficient documentation

## 2010-08-09 DIAGNOSIS — Z5333 Arthroscopic surgical procedure converted to open procedure: Secondary | ICD-10-CM | POA: Insufficient documentation

## 2010-08-09 DIAGNOSIS — D1600 Benign neoplasm of scapula and long bones of unspecified upper limb: Secondary | ICD-10-CM | POA: Insufficient documentation

## 2010-08-09 DIAGNOSIS — Z01810 Encounter for preprocedural cardiovascular examination: Secondary | ICD-10-CM | POA: Insufficient documentation

## 2010-08-09 DIAGNOSIS — M719 Bursopathy, unspecified: Secondary | ICD-10-CM | POA: Insufficient documentation

## 2010-08-09 LAB — POCT HEMOGLOBIN-HEMACUE: Hemoglobin: 13 g/dL (ref 12.0–15.0)

## 2010-08-09 NOTE — Letter (Signed)
Summary: [facility]   Imported By: Laural Benes 07/31/2010 10:47:23  _____________________________________________________________________  External Attachment:    Type:   Image     Comment:   External Document

## 2010-08-13 ENCOUNTER — Encounter: Payer: Self-pay | Admitting: Internal Medicine

## 2010-08-14 NOTE — Op Note (Signed)
Deborah Shields, Deborah Shields               ACCOUNT NO.:  [id number]  MEDICAL RECORD NO.:  [id number]            PATIENT TYPE:  LOCATION:                                 FACILITY:  PHYSICIAN:  Youlanda Mighty. Daylen Lipsky, M.D.      DATE OF BIRTH:  DATE OF PROCEDURE: DATE OF DISCHARGE:                              OPERATIVE REPORT   DATE OF OPERATION:  August 09, 2010.  PREOPERATIVE DIAGNOSIS:  Recurrent left rotator cuff tear with stage III impingement due to recurrent bony overgrowth of osteophytes, the lateral anterior acromion and the greater tuberosity.  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  OPERATION: 1. Examination of left shoulder under anesthesia, revealing no     evidence of adhesive capsulitis. 2. Diagnostic arthroscopy, left glenohumeral joint, confirming the     retracted split tear between the supraspinatus and infraspinatus     tendons.  This was degenerative/watershed, necrotic. 3. Hybrid arthroscopic and open repair of rotator cuff tear with     medial suture placement with arthroscopic technique, followed by     inset of a fiber tape with a lateral swivelock, reinforced by the     swivelock anchor, 2-0 FiberWire suture. 4. Subacromial decompression with bur and hand rasp.  OPERATING SURGEON:  Youlanda Mighty. Mahesh Sizemore, M.D.  ASSISTANT:  Leverne Humbles, PA-C.  ANESTHESIA:  General endotracheal supplemented by a left interscalene block with lidocaine and epinephrine.  COMPLICATIONS:  Transient atrial fibrillation with rapid ventricular response at 135 beats per minute, more likely than not due to absorption of epinephrine.  This predicament was discussed with the patient's  attending cardiologist, Dr.Croitoru of [facility].  After a detailed discussion with Dr. Sallyanne Kuster, Dr. Albertina Parr and myself, we proceeded with metoprolol beta blockade to control rhythm.  The patient rapidly converted to sinus rhythm.  PROCEDURE:  The patient was brought to room one of the [facility] and placed in supine position on the operating table.  Following an anesthesia informed consent with Dr. Albertina Parr in the holding area, a left interscalene block was placed without initial complication with the aid of ultrasonic guidance.  While observing in the holding area, the patient converted to atrial fibrillation with a ventricular response in the 130s.  She was awake, alert, and comfortable during this period of time.  We observed her for 15 minutes and noted that her rate was stable, but did not spontaneously convert.  We then contacted her attending cardiologist, Dr. Sallyanne Kuster of [facility] and discussed the lidocaine block with epinephrine. He concluded, as did we, that more likely than not this was an epinephrine response, and he recommended use of a short-acting beta- blocker, monitoring her pressure carefully.  He agreed that it would be safe to proceed with surgery at this time.  The patient was brought to room one at [facility] and placed in supine position upon the operating table.  Following the induction of general endotracheal anesthesia under Dr. Roslynn Amble direct supervision, she was carefully positioned in modified beach chair position recumbency due to concerns about her blood pressure falling in a fully upright position.  The left arm and forequarter were prepped with DuraPrep and draped with impervious arthroscopy drapes.  Procedure commenced with a routine surgical time-out.  The patient was noted to be in sinus rhythm.  Her drug allergies were noted, and 1 g of Ancef was administered as an IV  prophylactic antibiotic.  The scope was introduced through a standard posterior portal with blunt technique.  Diagnostic arthroscopy revealed moderate synovitis, grade II and III chondromalacia of the humeral head, minimal chondromalacia of the glenoid, and a retracted rotator cuff tear that extended from just posterior to the long head of the biceps to the mid infraspinatus.  This was a split tear between the two primary tendons and had retracted back to level of the glenoid.  A suction shaver was placed, introduced through an anterior portal under direct vision, followed by thorough synovectomy and debridement of the long head of the biceps and labrum.  The margins of the cuff tear were freshened, and due to the medial position of the displaced portion of the tendon, we elected to perform a hybrid repair utilizing the arthroscopic scorpion suture passer to place the most medial sutures.  An Arthrex fiber tape was used to perform a baseball stitch with three passes, closing the split and allowing lateralization of the supraspinatus, infraspinatus, and anatomic footprint.  There was a very significant lateral osteophyte noted at the anterior lateral acromion, and a large reactive osteophyte at the greater tuberosity.  To properly manage this predicament, I removed the arthroscopic equipment and performed an anterior middle deltoid splitting incision, exposing the bursa.  Redundant bursa was resected and the cuff visualized.  The fiber tape sutures were recovered with a nerve hook, and a power bur was used to lower the profile of the greater tuberosity 3-4 mm, removing the reactive osteophyte, and the anterolateral acromion was tailored with a power bur and a hand rasp to a flat type 1 morphology. Likewise, the posterior lateral osteophyte was removed with the hand rasp.  At the conclusion of the bone debridement, we tensioned the fiber tape and replaced the scope in the  glenohumeral joint through the posterior portal.  There was no fouling in the long head of the biceps with the fiber tape.  The fiber tape was then tensioned, tied with three overhand knots, and then lateralized with a swivelock to an anatomic footprint, covering the decorticated greater tuberosity.  The swivelock anchor suture was then used for finishing suture, leveling the dog ears of the repair.  A very satisfactory low-profile repair was achieved that was anatomic.  The subacromial space was then thoroughly lavaged with sterile saline followed by removal of the arthroscopic equipment.  The wound was then repaired with interrupted suture of 0 Vicryl, closing the deltoid split, and skin closure with subcutaneous 2-0 Vicryl and intradermal 2-0 Prolene.  Steri-Strips were applied.  Portals repaired with intradermal 2-0 Prolene.  There were no apparent complications.  The patient tolerated surgery and anesthesia well and was transferred to recovery room with stable signs.  We will monitor her progress overnight in the recovery care center and provide appropriate analgesics in the form of IV Dilaudid, p.o. Dilaudid, and IV vancomycin times one dose as a prophylactic antibiotic.  Youlanda Mighty Javier Mamone, M.D.     RVS/MEDQ  D:  08/09/2010  T:  08/10/2010  Job:  [id number]  cc:   Darrick Penna. Linna Darner, MD,FACP,FCCP Sanda Klein, MD  Electronically Signed by Theodis Sato M.D. on 08/14/2010 08:33:18 AM

## 2010-08-15 NOTE — Letter (Signed)
Summary: [facility]  [facility]   Imported By: Laural Benes 08/06/2010 13:44:26  _____________________________________________________________________  External Attachment:    Type:   Image     Comment:   External Document

## 2010-08-22 ENCOUNTER — Encounter: Payer: Self-pay | Admitting: Internal Medicine

## 2010-08-22 ENCOUNTER — Other Ambulatory Visit: Payer: Self-pay | Admitting: Internal Medicine

## 2010-08-22 ENCOUNTER — Encounter (INDEPENDENT_AMBULATORY_CARE_PROVIDER_SITE_OTHER): Payer: Medicare Other | Admitting: Internal Medicine

## 2010-08-22 DIAGNOSIS — I10 Essential (primary) hypertension: Secondary | ICD-10-CM

## 2010-08-22 DIAGNOSIS — E039 Hypothyroidism, unspecified: Secondary | ICD-10-CM

## 2010-08-22 DIAGNOSIS — E782 Mixed hyperlipidemia: Secondary | ICD-10-CM

## 2010-08-22 DIAGNOSIS — IMO0001 Reserved for inherently not codable concepts without codable children: Secondary | ICD-10-CM

## 2010-08-22 LAB — TSH: TSH: 0.2 u[IU]/mL — ABNORMAL LOW (ref 0.35–5.50)

## 2010-08-29 ENCOUNTER — Other Ambulatory Visit: Payer: Self-pay

## 2010-08-29 ENCOUNTER — Telehealth (INDEPENDENT_AMBULATORY_CARE_PROVIDER_SITE_OTHER): Payer: Self-pay | Admitting: *Deleted

## 2010-08-30 NOTE — Letter (Signed)
Summary: [facility] By: Laural Benes 08/24/2010 09:39:53  _____________________________________________________________________  External Attachment:    Type:   Image     Comment:   External Document

## 2010-08-30 NOTE — Assessment & Plan Note (Signed)
Summary: yearly exam and fasting labs//sph/ph   Vital Signs:  Patient profile:   70 year old female Height:      66.25 inches (168.28 cm) Weight:      129.50 pounds (58.86 kg) BMI:     20.82 Temp:     97.7 degrees F (36.50 degrees C) oral Resp:     14 per minute BP sitting:   100 / 60  (left arm) Cuff size:   large  Vitals Entered By: Ernestene Mention CMA (August 22, 2010 8:23 AM) CC: Yearly CPX and fsting./kb Is Patient Diabetic? No Pain Assessment Patient in pain? no      Comments Patient notes that she needs all meds refilled.   Primary Care Provider:  Linus Orn  CC:  Yearly CPX and fsting./kb.  History of Present Illness:    She was kept overnight after L Rotator Cuff surgery 08/09/2010 by Dr Daylene Katayama because of dysrrhythmia for which she was seen by her Cardiologist. Blood work & EKG were done pre op. She has had [facility] panel because of her statin intolerance with elevated CPKs. Dr Melton Alar , Neurologist , did" muscle  & nerve tests ". He  stated "I should not take a statin". Presently she is not on any meds for cholesterol. She  denies the following symptoms: chest pain/pressure, dypsnea, palpitations, syncope, and pedal edema.  Dietary compliance has been fair.  The patient reports no exercise.  Adjunctive measures currently used by the patient include ASA.  Note : CPK was 345 pre op & off statin since 03/2010.  TSH was 6.2 on 88 micrograms daily except 1&1/2 on Weds of  Levoxyl . See ROS re endocrine symptoms.   Current Medications (verified): 1)  Lopressor 50 Mg  Tabs (Metoprolol Tartrate) .... 1/2  Two Times A Day 2)  Lorazepam 0.5 Mg  Tabs (Lorazepam) .Marland Kitchen.. 1 By Mouth Three Times A Day As Needed Only 3)  Restasis   Emul (Cyclosporine Emul) .... Two Times A Day 4)  Levoxyl 88 Mcg  Tabs (Levothyroxine Sodium) .Marland Kitchen.. 1 Tab Daily  Except 1& 1/2 On Weds 5)  Meclizine Hcl 25 Mg  Tabs (Meclizine Hcl) .Marland Kitchen.. 1 Tab Once Daily 6)  Celebrex 200 Mg  Caps (Celecoxib) .Marland Kitchen.. 1 To 2 Tabs  Daily 7)  Centrum Silver   Tabs (Multiple Vitamins-Minerals) .... Take 1 Tablet By Mouth Once A Day 8)  B Complex   Tabs (B Complex Vitamins) .Marland Kitchen.. 1 By Mouth Once Daily 9)  Calcium 600mg / Vit D 1200 .Marland Kitchen.. 1 By Mouth Two Times A Day 10)  Baby Aspirin 81 Mg  Chew (Aspirin) .... Take 1 Tablet By Mouth Once A Day As Directed 11)  Vitamin E .... 1 By Mouth Once Daily 12)  Ultram 50 Mg  Tabs (Tramadol Hcl) .... 1/2-1 By Mouth Once Daily As Needed 13)  Vitamin D 1000 Unit Tabs (Cholecalciferol) .Marland Kitchen.. 1 By Mouth Once Daily 14)  Losartan Potassium 100 Mg Tabs (Losartan Potassium) .Marland Kitchen.. 1 Once Daily in Place of Azor 15)  Norvasc 5 Mg Tabs (Amlodipine Besylate) .Marland Kitchen.. 1 By Mouth Daily.  Allergies (verified): 1)  ! Penicillin 2)  ! Wellbutrin 3)  ! Paxil 4)  ! Asa 5)  ! Lovastatin 6)  ! Codeine 7)  ! * Benazepril 8)  ! Pravachol (Pravastatin Sodium) 9)  ! Zetia (Ezetimibe)  Past History:  Past Medical History: Diverticulosis, colon Hypothyroidism Hyperlipidemia: NMR 07/2009: LDL 200 (2260/1233), TG 99, HDL 65. LDL goal = < 120, ideally <  51. father MI @ 22. Hypertension microscopic hematuria, PMH of basal cell carcinoma,Dr Houston degenerative joint disease peripheral vascular disease of ICA bilat, Dr Ruta Hinds, [facility] Peripheral neuropathy, compressive  Family History: Father: MI  @ age 69 Mother: Colon cancer,  CVA, nephrectomy ? reason (died in 51's) Siblings: 59 sisters--1 w/ovarian cancer , kidney disease (died)                                1 w/AV valve, aortic root                                1 w/MV replacement               6 brothers--2 w/CAD, both deceased                                   1 died @ early age  ( age 69)of heart problems                                   2 w/AV replacement                                   1 w/ AV disease                                   1 w/alcoholism, liver disease secondary to this;  M uncle :bone  cancer  Review of Systems General:   Complains of fatigue; denies weight loss. Eyes:  Denies blurring, double vision, and vision loss-both eyes. ENT:  Denies difficulty swallowing and hoarseness. CV:  Occasional palpitations. GI:  Denies diarrhea; Constipation post op. MS:  Seeing Dr Lenna Gilford for Rheumatologic issues; she referred her to Dr Melton Alar.. Derm:  Denies changes in nail beds, dryness, and hair loss. Neuro:  Denies numbness and tingling. Endo:  Denies cold intolerance and heat intolerance.  Physical Exam  General:  LUE in sling,in no acute distress; alert,appropriate and cooperative throughout examination Neck:  No deformities, masses, or tenderness noted. Lungs:  Normal respiratory effort, chest expands symmetrically. Lungs are clear to auscultation, no crackles or wheezes. Heart:  Normal rate and regular rhythm. S1 and S2 normal without gallop, murmur, click, rub . S4 Pulses:  R and L carotid,radial pulses are full and equal bilaterally. Decreased pedal pulses Extremities:  No clubbing, cyanosis, edema.  Severe OA of hands Neurologic:  alert & oriented X3.   Skin:  Intact without suspicious lesions or rashes Psych:  memory intact for recent and remote, normally interactive, and good eye contact.     Impression & Recommendations:  Problem # 1:  HYPERLIPIDEMIA (P102836.2) risks discussed after review of NMR Lipoprofile & [facility] Panel  Problem # 2:  HYPOTHYROIDISM (ICD-244.9)  Her updated medication list for this problem includes:    Levoxyl 88 Mcg Tabs (Levothyroxine sodium) .Marland Kitchen... 1 tab daily  except 1& 1/2 on weds  Orders: Venipuncture [id number]) Specimen Handling (99000) TLB-TSH (Thyroid Stimulating Hormone) (84443-TSH)  Problem # 3:  HYPERTENSION, ESSENTIAL NOS (ICD-401.9) controlledHer updated medication list for this problem  includes:    Lopressor 50 Mg Tabs (Metoprolol tartrate) .Marland Kitchen... 1/2  two times a day    Losartan Potassium 100 Mg Tabs (Losartan potassium) .Marland Kitchen... 1 once daily in place of azor     Norvasc 5 Mg Tabs (Amlodipine besylate) .Marland Kitchen... 1 by mouth daily.  Problem # 4:  MUSCLE PAIN (ICD-729.1) Elevated CPKs even off statins; risk of statin or Welchol will need to be assessed by Rheumatologist & Cardiologist Her updated medication list for this problem includes:    Celebrex 200 Mg Caps (Celecoxib) .Marland Kitchen... 1 to 2 tabs daily    Baby Aspirin 81 Mg Chew (Aspirin) .Marland Kitchen... Take 1 tablet by mouth once a day as directed    Ultram 50 Mg Tabs (Tramadol hcl) .Marland Kitchen... 1/2-1 by mouth once daily as needed  Complete Medication List: 1)  Lopressor 50 Mg Tabs (Metoprolol tartrate) .... 1/2  two times a day 2)  Lorazepam 0.5 Mg Tabs (Lorazepam) .Marland Kitchen.. 1 by mouth three times a day as needed only 3)  Restasis Emul (Cyclosporine emul) .... Two times a day 4)  Levoxyl 88 Mcg Tabs (Levothyroxine sodium) .Marland Kitchen.. 1 tab daily  except 1& 1/2 on weds 5)  Meclizine Hcl 25 Mg Tabs (Meclizine hcl) .Marland Kitchen.. 1 tab once daily 6)  Celebrex 200 Mg Caps (Celecoxib) .Marland Kitchen.. 1 to 2 tabs daily 7)  Centrum Silver Tabs (Multiple vitamins-minerals) .... Take 1 tablet by mouth once a day 8)  B Complex Tabs (B complex vitamins) .Marland Kitchen.. 1 by mouth once daily 9)  Calcium 600mg / Vit D 1200  .Marland KitchenMarland Kitchen. 1 by mouth two times a day 10)  Baby Aspirin 81 Mg Chew (Aspirin) .... Take 1 tablet by mouth once a day as directed 11)  Vitamin E  .... 1 by mouth once daily 12)  Ultram 50 Mg Tabs (Tramadol hcl) .... 1/2-1 by mouth once daily as needed 13)  Vitamin D 1000 Unit Tabs (Cholecalciferol) .Marland Kitchen.. 1 by mouth once daily 14)  Losartan Potassium 100 Mg Tabs (Losartan potassium) .Marland Kitchen.. 1 once daily in place of azor 15)  Norvasc 5 Mg Tabs (Amlodipine besylate) .Marland Kitchen.. 1 by mouth daily.  Patient Instructions: 1)  Discuss these data with your Rheumatologist & Cardiologist . Consider refferal to Dr Arther Dames , [facility] concernig Lipid risks as discussed.   Orders Added: 1)  Est. Patient Level IV [id number] 2)  Venipuncture [id number] 3)  Specimen Handling [99000] 4)  TLB-TSH  (Thyroid Stimulating Hormone) [84443-TSH]  Appended Document: yearly exam and fasting labs//sph/ph Note: face to face time with > 50% in counselling concernig cardiovascular risks & lab review  with her & her daughter encompassed 35 minutes

## 2010-09-04 NOTE — Progress Notes (Signed)
Summary: rx  Phone Note Refill Request Call back at Home Phone [phone] Call back at [phone]--cell Message from:  Patient  Refills Requested: Medication #1:  LEVOXYL 88 MCG  TABS 1 tab daily  EXCEPT  NONE on Weds   Dosage confirmed as above?Dosage Confirmed   Supply Requested: 3 months 90 day supply to [facility] tell them to ship and bill. will be out soon--[phone]  Initial call taken by: Velora Heckler,  August 29, 2010 11:34 AM    New/Updated Medications: LEVOXYL 88 MCG  TABS (LEVOTHYROXINE SODIUM) 1 tab daily  EXCEPT  NONE on Weds **[facility] [employer]** Prescriptions: LEVOXYL 88 MCG  TABS (LEVOTHYROXINE SODIUM) 1 tab daily  EXCEPT  NONE on Weds [employer] [facility] [employer]**  #90 x 0   Entered by:   [place] by:   Unice Cobble MD   Signed by:   Georgette Dover CMA on 08/29/2010   Method used:   Faxed to ...       [facility]order)             , [place]         Ph:        Fax: [phone]   RxID:   [id number]

## 2010-10-23 ENCOUNTER — Other Ambulatory Visit (INDEPENDENT_AMBULATORY_CARE_PROVIDER_SITE_OTHER): Payer: Medicare Other

## 2010-10-23 ENCOUNTER — Other Ambulatory Visit: Payer: Medicare Other

## 2010-10-23 DIAGNOSIS — E039 Hypothyroidism, unspecified: Secondary | ICD-10-CM

## 2010-10-23 LAB — TSH: TSH: 10.64 u[IU]/mL — ABNORMAL HIGH (ref 0.35–5.50)

## 2010-11-01 ENCOUNTER — Ambulatory Visit (INDEPENDENT_AMBULATORY_CARE_PROVIDER_SITE_OTHER): Payer: Medicare Other | Admitting: Internal Medicine

## 2010-11-01 ENCOUNTER — Encounter: Payer: Self-pay | Admitting: Internal Medicine

## 2010-11-01 DIAGNOSIS — F411 Generalized anxiety disorder: Secondary | ICD-10-CM

## 2010-11-01 DIAGNOSIS — I739 Peripheral vascular disease, unspecified: Secondary | ICD-10-CM

## 2010-11-01 DIAGNOSIS — E039 Hypothyroidism, unspecified: Secondary | ICD-10-CM

## 2010-11-01 DIAGNOSIS — E782 Mixed hyperlipidemia: Secondary | ICD-10-CM

## 2010-11-01 DIAGNOSIS — G609 Hereditary and idiopathic neuropathy, unspecified: Secondary | ICD-10-CM

## 2010-11-01 MED ORDER — MECLIZINE HCL 25 MG PO TABS: 25.0000 mg | ORAL_TABLET | Freq: Every day | ORAL | Status: AC

## 2010-11-01 MED ORDER — LEVOTHYROXINE SODIUM 88 MCG PO TABS: 88.0000 ug | ORAL_TABLET | Freq: Every day | ORAL | Status: DC

## 2010-11-01 MED ORDER — LORAZEPAM 0.5 MG PO TABS: 0.5000 mg | ORAL_TABLET | Freq: Three times a day (TID) | ORAL | Status: DC | PRN

## 2010-11-01 NOTE — Assessment & Plan Note (Signed)
With imbalance

## 2010-11-01 NOTE — Patient Instructions (Addendum)
Take the WelChol one packet as directed daily. Increase the thyroid to 88 mcg 1 pill daily except one half on Wednesday. In 10 weeks check fasting lipid panel and TSH. Take the meclizine and lorazepam as infrequently as possible as we discussed because of the increased risk of fall and musculoskeletal o injury, especially if taken  together.

## 2010-11-01 NOTE — Progress Notes (Signed)
  Subjective:    Patient ID: Deborah Shields, female    DOB: Sep 07, 1940, 70 y.o.   MRN: [id number]  HPI  Thyroid function monitor  Medications status(change in dose/brand8-/mode of administration):on Levthyroxine 88 mcg w/o any regimen changes (daily except none on Weds) with water alone Constitutional: Weight change: down 8-10#; Fatigue:constantly; Sleep pattern:disrupted ( partially related to OA pain); Appetite:"poor"  Visual change(blurred/diplopia/visual loss):no Hoarseness:no; Swallowing issues:no Cardiovascular: Palpitations:no; Racing:no; Irregularity:no GI: Constipation:yes; Diarrhea:no Derm: Change in nails/hair/skin:hair loss Neuro: Numbness/tingling:no; Tremor:no Psych: Anxiety:no; Depression:no; Panic attacks:no Endo: Temperature intolerance: Heat:no; Cold:yes     Review of Systems     Objective:   Physical Exam General appearance:thin but healthy; no acute distress or increased work of breathing is present.  No  lymphadenopathy about the head, neck, or axilla noted.  Eyes: No conjunctival inflammation or lid edema is present. There is no scleral icterus. No lid lag. Neck:  No deformities, thyromegaly, masses, or tenderness noted.   Supple with full range of motion without pain.  Heart:  Normal rate and regular rhythm. S1 and S2 normal without gallop, murmur, click, rub or other extra sounds.  Lungs:Chest clear to auscultation; no wheezes, rhonchi,rales ,or rubs present.No increased work of breathing.  Extremities:  No cyanosis, edema, or clubbing  noted . Severe OA  Changes. No onycholysis. Skin: Warm & dry w/o jaundice or tenting.  Neuro: Deep tendon reflexes were normal;no tremor.       Assessment & Plan:  #1 hypothyroidism; she is on inadequate replacement at this time. ROS reveals unexpected signs / symptoms ( weight loss) #2 dyslipidemia with significant risk which has been discussed in detail. She is intolerant to multiple statins. #3 balance issues; she's  been using meclizine. Risk due to age & concomitant lorazepam therapy discussed. ("I won't sue if I fall & get hurt"  Plan: A trial of WelChol will  Initiated  @ low dose with lipids in 10 weeks  #2 the levothyroxine will be increased to one pill every day except half a pill on Wednesday. TSH will be repeated in 10 weeks.  #3 It was STRESSED  that lorazepam and  the meclizine should be taken as infrequently as possible because of the documented increased risk of falls and muscuoskeletal or neurologic  injury if  taken on  regular basis, particularly together.

## 2010-11-06 NOTE — Assessment & Plan Note (Signed)
OFFICE VISIT   Deborah, Shields  DOB:  [birth year]                                       04/01/2007  [id number]   HISTORY:  The patient returns for followup today for asymptomatic  moderate carotid stenosis.  She continues to deny any symptoms of TIA,  amaurosis or stroke.  She states that her granddaughter is not doing  well with her Ewing's sarcoma.   MEDICATIONS:  All of her medications are unchanged except for her  anxiety medicines which were recently increased.   PHYSICAL EXAMINATION:  Vital Signs:  Blood pressure is 144/84, heart  rate 67 and regular.  Neck:  She has 2+ carotid pulses with no bruit  appreciated today.  Neurologic:  Exam shows symmetric upper extremity  and lower extremity motor strength.  She has no pronator drift.   She continues to take her aspirin daily.  She had a repeat carotid  duplex exam today which showed 60% to 80% bilateral internal carotid  artery stenosis.  Peak systolic velocities were 99991111 on the right, 205 on  the left today.   IMPRESSION AND PLAN:  Overall the patient is doing well.  She will  follow up in 6 months' time for repeat carotid duplex exam or sooner if  she has neurologic symptoms.   Jessy Oto. Fields, MD  Electronically Signed   CEF/MEDQ  D:  04/01/2007  T:  04/02/2007  Job:  [id number]   cc:   Darrick Penna. Linna Darner, MD,FACP,FCCP

## 2010-11-06 NOTE — Procedures (Signed)
CAROTID DUPLEX EXAM   INDICATION:  Follow up carotid disease.   HISTORY:  Diabetes:  No.  Cardiac:  Arrhythmia.  Hypertension:  Yes.  Smoking:  No.  Previous Surgery:  No.  CV History:  Currently asymptomatic, history of FMD.  Amaurosis Fugax No, Paresthesias No, Hemiparesis No.                                       RIGHT             LEFT  Brachial systolic pressure:         142               144  Brachial Doppler waveforms:         Normal            Normal  Vertebral direction of flow:        Antegrade         Antegrade  DUPLEX VELOCITIES (cm/sec)  CCA peak systolic                   77                76  ECA peak systolic                   47                42  ICA peak systolic                   209 (mid)         177 (distal)  ICA end diastolic                   87                77  PLAQUE MORPHOLOGY:                  Heterogenous      Heterogenous  PLAQUE AMOUNT:                      Minimal           Minimal  PLAQUE LOCATION:                    Proximal ICA      Proximal ICA   IMPRESSION:  1. Doppler velocities suggest a 60-79% stenosis of the right mid      internal carotid artery; however, this increase in velocity is most      likely due to a kink in the internal carotid artery.  2. Doppler velocities suggest a 40-59% stenosis of the left mid-to-      distal internal carotid artery; however, this increase in velocity      is most likely due to fibromuscular dysplasia.  3. Plaque formations were only appreciated at the bilateral proximal      internal carotid artery levels.  4. Doppler velocities appear less than previously recorded when      compared to the previous examination on 04/13/08.   ___________________________________________  Jessy Oto. Fields, MD   CH/MEDQ  D:  10/03/2008  T:  10/03/2008  Job:  [id number]

## 2010-11-06 NOTE — Procedures (Signed)
CAROTID DUPLEX EXAM   INDICATION:  Carotid disease.   HISTORY:  Diabetes:  No  Cardiac:  No  Hypertension:  Yes  Smoking:  No  Previous Surgery:  No  CV History:  Currently asymptomatic  Amaurosis Fugax No, Paresthesias No, Hemiparesis No                                       RIGHT             LEFT  Brachial systolic pressure:         140               140  Brachial Doppler waveforms:         Normal            Normal  Vertebral direction of flow:        Antegrade         Antegrade  DUPLEX VELOCITIES (cm/sec)  CCA peak systolic                   77                68  ECA peak systolic                   49                46  ICA peak systolic                   P=52/D=180        123456  ICA end diastolic                   P=22/D=70         P=28/D=52  PLAQUE MORPHOLOGY:                  Mixed             Mixed  PLAQUE AMOUNT:                      Minimal           Minimal  PLAQUE LOCATION:                    Proximal ICA      Proximal ICA   IMPRESSION:  1. Doppler velocities suggest a 40% to 49% stenosis of the right mid-      internal carotid artery; however, this increased velocity is most      likely due to a kink in the vessel.  2. Doppler velocities suggest a low-end 60% to 79% stenosis of the      left mid to distal internal carotid artery; however, this increased      velocity is most likely due to vessel size versus fibromuscular      dysplasia.  3. No significant change noted in the Doppler velocities when compared      to the previous exam on October 03, 2008.         ___________________________________________  Jessy Oto. Fields, MD   CH/MEDQ  D:  03/30/2009  T:  03/30/2009  Job:  [id number]

## 2010-11-06 NOTE — Procedures (Signed)
CAROTID DUPLEX EXAM   INDICATION:  Follow-up evaluation of known carotid artery disease.   HISTORY:  Diabetes:  No.  Cardiac:  Arrhythmia.  Hypertension:  Yes.  Smoking:  No.  Previous Surgery:  No.  CV History:  History of fibromuscular dysplasia.  Previous duplex on  09/30/2007 revealed a distal right ICA peak systolic velocity of A999333  cm/s, 99991111 cm/s end-diastolic velocity.  On the left, peak systolic  velocity A999333 cm/s, end-diastolic 84.  Amaurosis Fugax No, Paresthesias No, Hemiparesis No.                                       RIGHT             LEFT  Brachial systolic pressure:         156               156  Brachial Doppler waveforms:         Triphasic         Triphasic  Vertebral direction of flow:        Antegrade         Antegrade  DUPLEX VELOCITIES (cm/sec)  CCA peak systolic                   74                68  ECA peak systolic                   67                78  ICA peak systolic                   48                74  ICA end diastolic                   22                38  PLAQUE MORPHOLOGY:                  Mixed             Mixed  PLAQUE AMOUNT:                      Moderate          Moderate  PLAQUE LOCATION:                    Mid-to-distal ICA Mid-to-distal ICA   IMPRESSION:  1. Distal right internal carotid artery peak systolic [id number] cm/s; peak      systolic 91 cm/s end-diastolic.  2. Distal left internal carotid artery 205 cm/s; peak systolic 63      cm/s, end-diastolic.  3. No proximal internal carotid artery stenosis bilaterally.  4. Elevated peak systolic velocities in the distal internal carotid      arteries are stable compared to previous studies bilaterally.  5. Cannot rule out fibromuscular dysplasia versus distal internal      carotid artery kink.   ___________________________________________  Jessy Oto Fields, MD   MC/MEDQ  D:  04/13/2008  T:  04/13/2008  Job:  [id number]

## 2010-11-06 NOTE — Procedures (Signed)
CAROTID DUPLEX EXAM   INDICATION:  Followup carotid artery disease.   HISTORY:  Diabetes:  No.  Cardiac:  Arrhythmia.  Hypertension:  Yes.  Smoking:  No.  Previous Surgery:  No.  CV History:  FMD.  Amaurosis Fugax No, Paresthesias No, Hemiparesis No                                       RIGHT             LEFT  Brachial systolic pressure:         155               150  Brachial Doppler waveforms:         Biphasic          Biphasic  Vertebral direction of flow:        Antegrade         Antegrade  DUPLEX VELOCITIES (cm/sec)  CCA peak systolic                   82                89  ECA peak systolic                   78                76  ICA peak systolic                   P=66, M/D=226     P=62, [id number]  ICA end diastolic                   P=30, M/D=101     P=18, M/D=84  PLAQUE MORPHOLOGY:                  Mixed             Mixed  PLAQUE AMOUNT:                      Moderate          Moderate  PLAQUE LOCATION:                    ICA               ICA   IMPRESSION:  1. Bilateral ICAs show no significant stenosis proximally.  However,      velocities are suggestive of 60-79% in the mid to distal.  2. Questionable FMD as well as tortuous ICAs with kinks.  3. Possible left ICA branch was noted.   ___________________________________________  Jessy Oto Fields, MD   AS/MEDQ  D:  09/30/2007  T:  09/30/2007  Job:  [id number]

## 2010-11-06 NOTE — Assessment & Plan Note (Signed)
OFFICE VISIT   Deborah Shields, Deborah Shields  DOB:  05/22/1941                                       04/05/2010  [id number]   The patient returns for followup today.  She was last seen in October  2008.  We have been following her for an asymptomatic moderate carotid  stenosis.  She has been followed since 2006 and has been asymptomatic  from her carotid disease.   She continues to deny any symptoms of TIA, amaurosis or stroke.   CHRONIC MEDICAL PROBLEMS:  Remain hypertension, elevated cholesterol,  and arthritis.  These are all followed by Dr. Linna Darner and currently  controlled.   A full 12-point review of systems was performed with the patient today.  Please see intake referral form for details regarding this.   FAMILY HISTORY:  Remarkable for her brothers who had coronary disease,  her father who had coronary disease and her sister who had valvular  heart disease.   SOCIAL HISTORY:  She is a nonsmoker, non-consumer of alcohol.  She is  widowed.  She has 1 child.   MEDICATIONS:  Currently include Restasis, metoprolol, losartan,  levothyroxine, meclizine, lorazepam, tramadol, simvastatin, aspirin,  Celebrex, multivitamin.   ALLERGIES:  She has intolerance to STATINS which have caused her muscle  cramping and elevated CPKs in the past.  She also has intolerance to  ASPIRIN which has caused her GI upset.  She also has allergies listed to  PENICILLIN and CODEINE.   Of note, she and I had a lengthy conversation today.  She is concerned  about her elevated cholesterol but has been unable to find a medication  that she can tolerate for this.  She had problems with Zetia which  caused GI upset.  Every statin she has been on has caused elevation of  CPKs.  She apparently is on low-dose statin now but apparently is still  having an elevation of her CPK and this being followed closely Dr.  Linna Darner.  I discussed with her that Dr. Linna Darner will continue to evaluate  her and see if he can find a cholesterol-lowering medication that she  can tolerate.  I did inform her that there are several other types of  medications out there and we will consider some of these for her in the  future.   PHYSICAL EXAMINATION:  Vital signs:  Blood pressure 146/82 in the right  arm, 146/79 in the left arm, oxygen saturation 97% on room air, heart  rate 66 and regular.  HEENT:  Unremarkable.  Neck:  Has 2+ carotid  pulses without bruit.  Chest:  Clear to auscultation.  Cardiac:  Exam is  regular, rate and rhythm without murmur.  Abdomen:  Soft, nontender,  nondistended.  No masses.  Extremities:  She has 2+ femoral pulses  bilaterally.  She has a 1+ left dorsalis pedis pulse.  He has absent  pedal pulses in the right foot.  Extremities have no significant edema.  Musculoskeletal:  Exam shows joint deformities in the hands consistent  with osteoarthritis.  Neurologic:  Exam shows symmetric upper extremity  and lower extremity motor strength which is 5/5.  Skin has no open  ulcers or rashes.   She had a repeat carotid duplex exam today which shows 68% stenosis on  the right side with tortuous vessels on the left side, possible  fibromuscular dysplasia but no flow-limiting stenosis on the left side.  These are essentially unchanged from 2006.   In summary, Deborah Shields has still a moderate right carotid stenosis  which is asymptomatic.  I believe this warrants long-term followup and  we will schedule her for an ultrasound again in one year's time.  On  physical exam today she did have evidence of some peripheral arterial  disease.  I do not believe this is what is causing her leg symptoms and  cramping as these are usually nighttime rest cramps as opposed to  classic claudication symptoms.  However, in light of her abnormal pulse  exam, we will obtain ABIs on her next office visit to see what her  baseline arterial circulation is in the lower extremities.  I encouraged   her to continue followup with Dr. Linna Darner who hopefully will be able to  adjust her anti-cholesterol medications in the long-term.  I will see  her again in 2 year's time unless there are significant findings or  changes in her vascular lab evaluation in 1 year.     Jessy Oto. Fields, MD  Electronically Signed   CEF/MEDQ  D:  04/05/2010  T:  04/06/2010  Job:  [id number]   cc:   Darrick Penna. Linna Darner, MD,FACP,FCCP

## 2010-11-06 NOTE — Procedures (Signed)
CAROTID DUPLEX EXAM   INDICATION:  Carotid disease.   HISTORY:  Diabetes:  No.  Cardiac:  No.  Hypertension:  Yes.  Smoking:  No.  Previous Surgery:  No.  CV History:  Currently asymptomatic.  Amaurosis Fugax No, Paresthesias No, Hemiparesis No.                                       RIGHT             LEFT  Brachial systolic pressure:         168               152  Brachial Doppler waveforms:         Normal            Normal  Vertebral direction of flow:        Antegrade         Antegrade  DUPLEX VELOCITIES (cm/sec)  CCA peak systolic                   69                68  ECA peak systolic                   72                37  ICA peak systolic                   P = 50/M = 210    P = 44/D = 123456  ICA end diastolic                   P = 21/M = 80     P = 17/D = 70  PLAQUE MORPHOLOGY:                  Heterogenous      Heterogenous  PLAQUE AMOUNT:                      Minimal           Minimal  PLAQUE LOCATION:                    Proximal ICA      Proximal ICA   IMPRESSION:  1. Doppler velocities suggest 60% to 79% stenoses of the right mid      internal carotid artery and left mid-to-distal internal carotid      artery; however, no plaque formations were adequately visualized in      these regions.  The elevated velocities of the right mid internal      carotid artery appear to be due to a kink in the vessel.  The      elevated velocities of the left mid-to-distal internal carotid      artery may be due to possible fibromuscular dysplasia, as noted on      a previous MRA from 06/29/2004.  2. No significant change in velocities noted when compared to the      previous examinations.   ___________________________________________  Jessy Oto Fields, MD   CH/MEDQ  D:  04/05/2010  T:  04/05/2010  Job:  [id number]

## 2010-11-06 NOTE — Procedures (Signed)
CAROTID DUPLEX EXAM   INDICATION:  Followup known carotid artery disease.   HISTORY:  Diabetes:  No  Cardiac:  Arrhythmias  Hypertension:  Yes  Smoking:  No  Previous Surgery:  No  CV History:  FMD  Amaurosis Fugax Yes No, Paresthesias Yes No, Hemiparesis Yes No                                       RIGHT             LEFT  Brachial systolic pressure:         160               170  Brachial Doppler waveforms:         Biphasic          Biphasic  Vertebral direction of flow:        Antegrade         Antegrade.  DUPLEX VELOCITIES (cm/sec)  CCA peak systolic                   56                66  ECA peak systolic                   45                58  ICA peak systolic                   263 (mid to distal)                 205 (mid to distal)  ICA end diastolic                   103               89  PLAQUE MORPHOLOGY:                  Heterogeneous     Heterogenous  PLAQUE AMOUNT:                      Moderate to severe                  Moderate to severe  PLAQUE LOCATION:                    ICA               ICA   IMPRESSION:  60-79% stenosis noted in the bilateral mid to distal ICAs.  Questionable FMD.  Antegrade bilateral vertebral arteries.   ___________________________________________  Jessy Oto Fields, MD   MG/MEDQ  D:  04/01/2007  T:  04/02/2007  Job:  [id number]

## 2010-11-08 ENCOUNTER — Telehealth: Payer: Self-pay

## 2010-11-09 NOTE — Op Note (Signed)
[name], Deborah Shields               ACCOUNT NO.:  [id number]   MEDICAL RECORD NO.:  [id number]          PATIENT TYPE:  OBV   LOCATION:  [id number]                         FACILITY:  [facility]   PHYSICIAN:  Darrelyn Hillock, MDDATE OF BIRTH:  1940/12/03   DATE OF PROCEDURE:  12/11/2004  DATE OF DISCHARGE:                                 OPERATIVE REPORT   PREOPERATIVE DIAGNOSIS:  Biliary dyskinesia.   POSTOPERATIVE DIAGNOSIS:  Biliary dyskinesia.   PROCEDURE:  Laparoscopic cholecystectomy with intraoperative cholangiogram.   FINDINGS:  Normal cholangiogram.   SURGEON:  Darrelyn Hillock, MD   ASSISTANT:  Fenton Malling. Lucia Gaskins, M.D.   ANESTHESIA:  General.   DESCRIPTION:  The patient was taken to the operating room and placed in a  supine position.  After adequate general anesthesia was induced using  endotracheal tube, the abdomen was prepped and draped in a normal sterile  fashion.  The transverse infraumbilical incision was made, and the fascia  was opened vertically.  An 0 Vicryl purse-string suture was placed around  the fascial defect.  An Hasson trocar was placed in the abdomen.  Under  direct visualization, an 11 mm trocar was placed in the subxiphoid region.  Two 5 mm's were placed in the right abdomen.  The gallbladder was identified  and retracted cephalad.  Dissection of the neck of the gallbladder easily  visualized the cystic duct.  It was clipped proximally.  Ductotomy was  created, and cholangiogram was performed, which showed no filling defects  with free flow into the duodenum.  The cystic artery was dissected in a  similar fashion, triply clipped and divided.  The gallbladder was taken off  the gallbladder bed using Bovie electrocautery and removed through the  umbilical port.  Adequate hemostasis was insured.  The pneumoperitoneum was  released.  The fascial defect was closed with the 0 Vicryl purse-string  suture.  Skin incisions were closed with subcuticular 4-0  Monocryl.  Steri-  Strips and sterile dressings were applied.  The patient tolerated the  procedure well and went to the PACU in good condition.       KRH/MEDQ  D:  12/11/2004  T:  12/11/2004  Job:  [id number]

## 2010-11-09 NOTE — Op Note (Signed)
NAMECARLETHIA, Deborah Shields               ACCOUNT NO.:  [id number]   MEDICAL RECORD NO.:  [id number]          PATIENT TYPE:  AMB   LOCATION:  [place]                          FACILITY:  [facility]   PHYSICIAN:  Youlanda Mighty. Sypher, M.D. DATE OF BIRTH:  [birth year]   DATE OF PROCEDURE:  03/13/2006  DATE OF DISCHARGE:                                 OPERATIVE REPORT   PREOPERATIVE DIAGNOSIS:  Chronic stage II impingement right shoulder with  acromioclavicular arthropathy and MRI evidence of significant rotator cuff  tendinopathy.   POSTOPERATIVE DIAGNOSIS:  Acromioclavicular arthropathy, stage II  impingement with rotator cuff tendinopathy, deep surface 50% thickness tear  of anterior supraspinatus, labral degenerative tear, and partial thickness  infraspinatus bursal surface tear due to chronic impingement.   OPERATION:  1. Diagnostic arthroscopy right glenohumeral joint with debridement of      labral degenerative tear and deep surface of supraspinatus rotator cuff      tendon.  2. Arthroscopic subacromial decompression with bursectomy, coracoacromial      ligament relaxation, and acromioplasty.  3. Arthroscopic distal clavicle resection.  4. Injection of Depo-Medrol and lidocaine into right long finger DIP      joint.  5. Injection of Depo-Medrol lidocaine into right long finger PIP joint.  6. Injection Depo-Medrol lidocaine into the right ring finger DIP joint.  7. Injection of Depo-Medrol and lidocaine into right small finger DIP      joint.   OPERATIONS:  Youlanda Mighty. Sypher, M.D.   ASSISTANT:  Marily Lente. Dasnoit, P.A.-C.   ANESTHESIA:  General endotracheal supplemented by right interscalene block.   SUPERVISING ANESTHESIOLOGIST:  Soledad Gerlach, M.D.   INDICATIONS:  Deborah Shields is a 70 year old woman referred through the  courtesy of Dr. Ignacia Palma for evaluation and management of a painful right  shoulder.  I have known Ms. Deborah Shields for years after reconstructing her left  rotator  cuff.  She had had a prior right shoulder arthroscopy performed by  Dr. Lynann Bologna more than ten years ago.  She was developing progressive right  shoulder pain.  Her clinical examination suggested impingement, AC  arthropathy, and a possible rotator cuff tear.  An MRI evaluation of her  shoulder demonstrated tendinopathy of her infraspinatus tendon posteriorly,  AC arthropathy, and unfavorable acromial anterior medial and lateral  anatomy.  Due to a failure to respond to nonoperative measures, she is  brought to the operating at this time for evaluation of her shoulder with  diagnostic arthroscopy, anticipating debridement, subacromial decompression,  and distal clavicle resection.   Preoperatively, we advised Deborah Shields that we if we found a significant  rotator cuff tear that we would proceed with primary repair at this time.   In addition, during our consultation in the office, she noted significant  pain due to hypertrophic osteoarthritis of multiple interphalangeal joints  in her right hand.  She requested that we inject her small joints of the  long, ring, and small fingers to try to offer some degree of pain relief due  to her deforming osteoarthritis.  After informed consent in the holding area  and  proper permitting, she is brought to the operating room at this time.   PROCEDURE:  Deborah Shields is brought to the operating room and placed in the  supine position on the operating room table.  Following an anesthesia  consult by Dr. Ola Spurr, an interscalene block was placed without  complication.  Deborah Shields was transferred to the operating room and placed  in the supine position on the operating room table.  Following the induction  of general endotracheal anesthesia, she is carefully positioned in the beach-  chair position with the aid of a torso and head holder designed for shoulder  arthroscopy.  The right upper extremity and four quarter were prepped with  DuraPrep and  draped with impervious arthroscopy drapes.   The shoulder was examined under anesthesia and found to have elevation to  180 degrees, external rotation to 90 degrees, abduction to 90 degrees,  internal rotation to 80 degrees.  She is stable to anterior and posterior  stress and inferior stress.   The arthroscope was introduced through a standard posterior viewing portal  after distension of the shoulder with 20 mL of sterile saline.  Diagnostic  arthroscopy revealed a frayed superior and anterior labrum.  The long head  of the biceps had a stable origin at the glenoid and was followed to the  rotator interval.  There was a partial thickness tear of the deep surface of  the supraspinatus at its anterior 1/2.  This was debrided with a suction  shaver brought into an anterior portal under direct vision.  The labral  debris were also removed with the suction shaver.  The labrum was examined  circumferentially and found be, otherwise, intact.  The inferior recess was  normal.  The anterior capsule ligaments were normal.  The deep surface of  the infraspinatus was normal and the teres minor appeared normal.  The scope  was removed from the glenohumeral joint and placed in the subacromial space.  There was rather abundant bursitis noted.  There was evidence of a prior  acromioplasty with recurrent osteophyte formation at the [facility] joint medially  and anterior osteophyte at the insertion of the coracoacromial ligament and  a small lateral osteophyte.  The suction shaver was used to remove bursa  followed by use of a power bur to level the acromion to a type 1 morphology.  The capsule of AC joint was taken down with the cutting cautery followed by  documentation of the morphology of the distal clavicle with the digital  camera.  A suction shaver was used to remove the distal 15 mm clavicle  arthroscopically.  A very satisfactory decompression was accomplished.  The decompression was documented with  the scope in the lateral portal with a  digital photograph documenting the type 1 morphology achieved.  The rotator  cuff was inspected and found to have a 50% surface tear due to abrasion at  the junction of the anterior teres minor and posterior infraspinatus.  This  was documented and subsequently smoothed with a suction shaver.  After final  bursectomy, hemostasis was achieved with the bipolar cautery.   The arthroscope was removed followed by repair of the portals with mattress  suture of 3-0 Prolene.  There were no apparent complications.  Deborah Shields  tolerated the surgery and anesthesia well.  She was transferred to the  recovery room with stable vital signs.   At age 70, given her anesthesia experience and saline load, she is admitted  for observation for  23 hours in the [place].  We will continue  her routine medications and provide prophylactic antibiotics in the form of  Ancef 1 gram IV q.8h. x3 doses and will use of p.o. and IV Dilaudid p.r.n.  pain.      Youlanda Mighty Sypher, M.D.  Electronically Signed     RVS/MEDQ  D:  03/13/2006  T:  03/14/2006  Job:  [id number]   cc:   Darrick Penna. Linna Darner, MD,FACP,FCCP

## 2010-11-12 NOTE — Telephone Encounter (Signed)
Per Dr.Hopper patient was given rx and he just wanted to confirm that med was ok for patient to take

## 2010-11-12 NOTE — Telephone Encounter (Signed)
She'll be on the WelChol daily with repeat fasting  lipids after 10 weeks is one packet only.  (272.4)

## 2011-01-03 ENCOUNTER — Telehealth: Payer: Self-pay

## 2011-01-03 NOTE — Telephone Encounter (Signed)
Phone rings busy

## 2011-01-15 ENCOUNTER — Telehealth: Payer: Self-pay | Admitting: Internal Medicine

## 2011-01-15 DIAGNOSIS — E785 Hyperlipidemia, unspecified: Secondary | ICD-10-CM

## 2011-01-15 NOTE — Telephone Encounter (Signed)
PT WANTS TO CALL BACK IN A COUPLE MONTHS TO SCHEDULE

## 2011-01-15 NOTE — Telephone Encounter (Signed)
Left message on machine to call back  

## 2011-01-15 NOTE — Telephone Encounter (Signed)
Patient was seen [phone] instruction states recheck lipid - tsh after 10 weeks of welchol - patient called to schedule lab but said she stopped taking welchol about 6 weeks ago because of muscle cramps - patient wanted labs anyway she is scheduled at [place]

## 2011-01-16 ENCOUNTER — Other Ambulatory Visit (INDEPENDENT_AMBULATORY_CARE_PROVIDER_SITE_OTHER): Payer: Medicare Other

## 2011-01-16 DIAGNOSIS — E785 Hyperlipidemia, unspecified: Secondary | ICD-10-CM

## 2011-01-16 LAB — LDL CHOLESTEROL, DIRECT: Direct LDL: 169.9 mg/dL

## 2011-01-16 LAB — LIPID PANEL
Cholesterol: 253 mg/dL — ABNORMAL HIGH (ref 0–200)
HDL: 56.6 mg/dL (ref >39.00)
Total CHOL/HDL Ratio: 4
Triglycerides: 142 mg/dL (ref 0.0–149.0)
VLDL: 28.4 mg/dL (ref 0.0–40.0)

## 2011-01-16 LAB — TSH: TSH: 5.23 u[IU]/mL (ref 0.35–5.50)

## 2011-01-16 NOTE — Telephone Encounter (Signed)
Hop would you like for pt to have lipids checked anyway?

## 2011-01-16 NOTE — Telephone Encounter (Signed)
Left msg for pt to return call.

## 2011-01-16 NOTE — Telephone Encounter (Signed)
I recommend she consider seeing a Lipidologist such as Dr Wilson Singer for second opinion on lipids. Since she stopped Welchol, no need to recheck lipids

## 2011-01-17 NOTE — Telephone Encounter (Signed)
Pt aware of information will schedule appt to see Dr. Wilson Singer says she may consider starting back on the welchol.

## 2011-01-18 NOTE — Telephone Encounter (Signed)
Addended by: Malachi Bonds on: 01/18/2011 08:27 AM   Modules accepted: Orders

## 2011-02-26 ENCOUNTER — Encounter: Payer: Self-pay | Admitting: Vascular Surgery

## 2011-04-03 ENCOUNTER — Other Ambulatory Visit: Payer: Self-pay | Admitting: Internal Medicine

## 2011-04-03 ENCOUNTER — Encounter: Payer: Self-pay | Admitting: Vascular Surgery

## 2011-04-03 DIAGNOSIS — Z1231 Encounter for screening mammogram for malignant neoplasm of breast: Secondary | ICD-10-CM

## 2011-04-04 ENCOUNTER — Encounter (INDEPENDENT_AMBULATORY_CARE_PROVIDER_SITE_OTHER): Payer: Medicare Other | Admitting: Vascular Surgery

## 2011-04-04 ENCOUNTER — Other Ambulatory Visit (INDEPENDENT_AMBULATORY_CARE_PROVIDER_SITE_OTHER): Payer: Medicare Other | Admitting: Vascular Surgery

## 2011-04-04 ENCOUNTER — Encounter: Payer: Self-pay | Admitting: Vascular Surgery

## 2011-04-04 ENCOUNTER — Ambulatory Visit (INDEPENDENT_AMBULATORY_CARE_PROVIDER_SITE_OTHER): Payer: Medicare Other | Admitting: Vascular Surgery

## 2011-04-04 VITALS — BP 141/85 | HR 64 | Resp 16 | Ht 66.5 in | Wt 133.3 lb

## 2011-04-04 DIAGNOSIS — I7092 Chronic total occlusion of artery of the extremities: Secondary | ICD-10-CM

## 2011-04-04 DIAGNOSIS — I6529 Occlusion and stenosis of unspecified carotid artery: Secondary | ICD-10-CM

## 2011-04-04 DIAGNOSIS — I70219 Atherosclerosis of native arteries of extremities with intermittent claudication, unspecified extremity: Secondary | ICD-10-CM | POA: Insufficient documentation

## 2011-04-04 NOTE — Progress Notes (Signed)
[facility] HISTORY AND PHYSICAL   History of Present Illness:  Patient is a 70 y.o. year old female who presents for follow-up evaluation for carotid stenosis.  She is on Aspirin for antiplatelet therapy.  His atherosclerotic risk factors remain elevated cholesterol and hypertension.  These are all currently stabled and followed by her primary care physician.  She continues to have problems finding a cholesterol medication she can tolerate.  She denies any new neurologic events including amaurosis, numbness, or weakness.  Past Medical History  Diagnosis Date  . Diverticulosis of colon   . Hypothyroidism   . Hyperlipidemia     NMR 07/2009: LDL 200 (2260/1233)TG 99, HDL 65. LDL goal =<120, ideally <100. father MI @ 15  . Hypertension   . Microscopic hematuria   . Basal cell carcinoma     dr Sherrye Payor  . Degenerative joint disease   . Peripheral vascular disease     ICA bilat, Dr.Charles fields, [facility]  . Peripheral neuropathy     compressive  . Carotid artery occlusion     Past Surgical History  Procedure Date  . Abdominal hysterectomy     age 19 due to dysfuctions meneses; HRT x 25-30 years  . Bilateral salpingoophorectomy     prophylactically (sister had ovarian ca)  . Appendectomy   . Cholecystectomy   . Tonsillectomy   . Orthopedic surgery     elbows,shoulder surgery x 3, right hand  . Carpal tunnel release     right  . Toe nail removed   . Bunionectomy     right foot  . Basal cell lle 03/2006  . Cataract extraction, bilateral 2010    Review of Systems:  Neurologic: as above Cardiac:denies shortness of breath or chest pain Pulmonary: denies cough or wheeze  Social History History  Substance Use Topics  . Smoking status: Never Smoker   . Smokeless tobacco: Not on file  . Alcohol Use: No    Allergies  Allergies  Allergen Reactions  . Aspirin     REACTION: GI upset  . Bupropion Hcl     REACTION: tinnitis  . Codeine    REACTION: GI upset  . Ezetimibe     REACTION: GI symptoms  . Lovastatin     REACTION: nausea  . Paroxetine   . Penicillins     REACTION: itching and edema  . Pravastatin Sodium     REACTION: elevated CPK     Current Outpatient Prescriptions  Medication Sig Dispense Refill  . amLODipine (NORVASC) 5 MG tablet Take 5 mg by mouth daily.        Marland Kitchen aspirin 81 MG tablet Take 81 mg by mouth daily.        Marland Kitchen b complex vitamins tablet Take 1 tablet by mouth daily.        . Calcium Carbonate-Vitamin D (CALCIUM-VITAMIN D) 600-200 MG-UNIT CAPS Take by mouth.        . celecoxib (CELEBREX) 200 MG capsule 1-2 tabs daily.       . Cholecalciferol (VITAMIN D) 1000 UNITS capsule Take 1,000 Units by mouth daily.        . Colesevelam HCl [facility]) 3.75 G PACK Take by mouth. Take 3.75 gm in 4-8 oz. water or fruit juice daily       . cycloSPORINE (RESTASIS) 0.05 % ophthalmic emulsion 1 drop 2 (two) times daily.        Marland Kitchen levothyroxine (SYNTHROID) 88 MCG tablet Take 88 mcg by mouth daily.  Taking the brand "synthroid"       . LORazepam (ATIVAN) 0.5 MG tablet Take 1 tablet (0.5 mg total) by mouth 3 (three) times daily as needed.  60 tablet  2  . losartan (COZAAR) 100 MG tablet Take 100 mg by mouth daily.        . meclizine (ANTIVERT) 25 MG tablet Take 1 tablet (25 mg total) by mouth daily. 1/2 every 8 hrs prn for imbalance  30 tablet  2  . metoprolol (LOPRESSOR) 50 MG tablet Take 25 mg by mouth. 1/2 by mouth two times daily      . Multiple Vitamins-Minerals (CENTRUM SILVER PO) Take by mouth.        . traMADol (ULTRAM) 50 MG tablet 1/2-1 by mouth once daily as needed.       . vitamin E 100 UNIT capsule Take 100 Units by mouth daily.          Physical Examination  Filed Vitals:   04/04/11 1532  BP: 141/85  Pulse: 64  Resp: 16  Height: 5' 6.5" (1.689 m)  Weight: 133 lb 4.8 oz (60.464 kg)    Body mass index is 21.19 kg/(m^2).  General:  Alert and oriented, no acute distress HEENT: Normal Neck: No bruit  or JVD Pulmonary: Clear to auscultation bilaterally Cardiac: Regular Rate and Rhythm without murmur Neurologic: Upper and lower extremity motor 5/5 and symmetric  DATA: Carotid duplex exam today shows bilateral 60-80% stenosis which is essentially unchanged from her previous exam. Her velocities overall are fairly stable since 2006.   ASSESSMENT: Patient with bilateral moderate carotid stenosis. She is currently asymptomatic. Her carotids had been fairly stable for several years. Had lengthy discussion with her about her cholesterol medications. She is currently working with Dr. Wilson Singer to find a medication she can tolerate. Fortunately she has had no significant change in her carotids over the last several years despite difficulty controlling her cholesterol.   PLAN: She'll return for a carotid duplex in 1 years time. She will continue her aspirin.  Ruta Hinds, MD [facility] Office: [phone] Pager: [phone]

## 2011-04-04 NOTE — Progress Notes (Unsigned)
ABI performed 04/04/2011 @[facility] 

## 2011-04-05 ENCOUNTER — Other Ambulatory Visit: Payer: Self-pay

## 2011-04-05 DIAGNOSIS — I6529 Occlusion and stenosis of unspecified carotid artery: Secondary | ICD-10-CM

## 2011-04-09 NOTE — Procedures (Unsigned)
CAROTID DUPLEX EXAM  INDICATION:  Follow up carotid stenosis.  HISTORY: Diabetes:  No. Cardiac:  No. Hypertension:  Yes. Smoking:  No. Previous Surgery:  No. CV History:  Asymptomatic. Amaurosis Fugax No, Paresthesias No, hemiparesis No.                                      RIGHT             LEFT Brachial systolic pressure:         151               149 Brachial Doppler waveforms:         WNL               WNL Vertebral direction of flow:        Antegrade         Antegrade DUPLEX VELOCITIES (cm/sec) CCA peak systolic                   61                57 ECA peak systolic                   54                54 ICA peak systolic                   59 - P/205 - M    47 - P/172 - M ICA end diastolic                   28 - P/85 - M     22 - P/66 - M PLAQUE MORPHOLOGY:                  Heterogenous      Heterogenous PLAQUE AMOUNT:                      Mild              Mild PLAQUE LOCATION:                    CCA/ICA           CCA/ICA  IMPRESSION.: 1. Bilateral 60% to 79% stenosis by velocity criteria in the mid     internal carotid artery segments; however, no plaque formations     were adequately visualized at these segments. 2. The elevated velocity of the right mid internal carotid artery is     most likely due to a "kink" in the vessel. 3. The elevated velocity on the left mid internal carotid artery may     be due to possible fibromuscular dysplasia, as noted on previous     MRA on 06/29/2004. 4. Unchanged since the previous study on 04/05/2010.  ___________________________________________ Jessy Oto. Fields, MD  SH/MEDQ  D:  04/04/2011  T:  04/04/2011  Job:  [id number]

## 2011-04-23 ENCOUNTER — Encounter: Payer: Self-pay | Admitting: Gastroenterology

## 2011-05-09 ENCOUNTER — Ambulatory Visit (AMBULATORY_SURGERY_CENTER): Payer: Medicare Other | Admitting: *Deleted

## 2011-05-09 VITALS — Ht 66.5 in | Wt 135.0 lb

## 2011-05-09 DIAGNOSIS — Z1211 Encounter for screening for malignant neoplasm of colon: Secondary | ICD-10-CM

## 2011-05-09 MED ORDER — PEG-KCL-NACL-NASULF-NA ASC-C 100 G PO SOLR: ORAL | Status: DC

## 2011-05-13 ENCOUNTER — Other Ambulatory Visit: Payer: Self-pay | Admitting: Dermatology

## 2011-05-22 ENCOUNTER — Other Ambulatory Visit: Payer: Medicare Other | Admitting: Gastroenterology

## 2011-05-29 ENCOUNTER — Other Ambulatory Visit: Payer: Self-pay

## 2011-05-29 MED ORDER — METOPROLOL TARTRATE 50 MG PO TABS: ORAL_TABLET | ORAL | Status: DC

## 2011-05-29 MED ORDER — AMLODIPINE BESYLATE 5 MG PO TABS: 5.0000 mg | ORAL_TABLET | Freq: Every day | ORAL | Status: DC

## 2011-05-29 NOTE — Telephone Encounter (Signed)
RXs sent.

## 2011-05-31 ENCOUNTER — Encounter: Payer: Self-pay | Admitting: Gastroenterology

## 2011-05-31 ENCOUNTER — Ambulatory Visit (AMBULATORY_SURGERY_CENTER): Payer: Medicare Other | Admitting: Gastroenterology

## 2011-05-31 VITALS — BP 133/78 | HR 68 | Temp 98.3°F | Resp 19 | Ht 66.0 in | Wt 135.0 lb

## 2011-05-31 DIAGNOSIS — Z1211 Encounter for screening for malignant neoplasm of colon: Secondary | ICD-10-CM

## 2011-05-31 MED ORDER — SODIUM CHLORIDE 0.9 % IV SOLN: 500.0000 mL | INTRAVENOUS | Status: DC

## 2011-05-31 NOTE — Progress Notes (Signed)
Pt tolerated the colonoscopy very well. maw 

## 2011-05-31 NOTE — Op Note (Signed)
[facility] [employer]. [place], [place]  [address]  COLONOSCOPY PROCEDURE REPORT  PATIENT:  Deborah, Shields  MR#:  [id number] BIRTHDATE:  12-31-1940, 59 yrs. old  GENDER:  female ENDOSCOPIST:  Milus Banister, MD PROCEDURE DATE:  05/31/2011 PROCEDURE:  Colonoscopy [phone] ASA CLASS:  Class II INDICATIONS:  Elevated Risk Screening, mother had colon cancer; last colonoscopy 2006 with [facility] MEDICATIONS:   Fentanyl 50 mcg IV, These medications were titrated to patient response per physician's verbal order, Versed 6 mg IV  DESCRIPTION OF PROCEDURE:   After the risks benefits and alternatives of the procedure were thoroughly explained, informed consent was obtained.  Digital rectal exam was performed and revealed no rectal masses.   The LB 180AL [id number] endoscope was introduced through the anus and advanced to the cecum, which was identified by both the appendix and ileocecal valve, without limitations.  The quality of the prep was good..  The instrument was then slowly withdrawn as the colon was fully examined. <<PROCEDUREIMAGES>>  FINDINGS:  A normal appearing cecum, ileocecal valve, and appendiceal orifice were identified. The ascending, hepatic flexure, transverse, splenic flexure, descending, sigmoid colon, and rectum appeared unremarkable (see image1, image2, and image3). Retroflexed views in the rectum revealed no abnormalities.  COMPLICATIONS:  None  ENDOSCOPIC IMPRESSION: 1) Normal colon 2) No polyps or cancers  RECOMMENDATIONS: 1) Given your significant family history of colon cancer, you should have a repeat colonoscopy in 5 years  REPEAT EXAM:  5 years  ______________________________ Milus Banister, MD  n. eSIGNED:   Milus Banister at 05/31/2011 10:52 AM  Tommi Emery, [id number]

## 2011-05-31 NOTE — Progress Notes (Signed)
Patient did not experience any of the following events: a burn prior to discharge; a fall within the facility; wrong site/side/patient/procedure/implant event; or a hospital transfer or hospital admission upon discharge from the facility. (G8907) Patient did not have preoperative order for IV antibiotic SSI prophylaxis. (G8918)  

## 2011-06-03 ENCOUNTER — Ambulatory Visit
Admission: RE | Admit: 2011-06-03 | Discharge: 2011-06-03 | Disposition: A | Discharge status: Home / Self Care | Payer: Medicare Other | Source: Ambulatory Visit | Attending: Internal Medicine | Admitting: Internal Medicine

## 2011-06-03 ENCOUNTER — Telehealth: Payer: Self-pay | Admitting: *Deleted

## 2011-06-03 DIAGNOSIS — Z1231 Encounter for screening mammogram for malignant neoplasm of breast: Secondary | ICD-10-CM

## 2011-06-03 NOTE — Telephone Encounter (Signed)

## 2011-07-16 DIAGNOSIS — E789 Disorder of lipoprotein metabolism, unspecified: Secondary | ICD-10-CM | POA: Diagnosis not present

## 2011-07-23 DIAGNOSIS — E789 Disorder of lipoprotein metabolism, unspecified: Secondary | ICD-10-CM | POA: Diagnosis not present

## 2011-07-23 DIAGNOSIS — G4700 Insomnia, unspecified: Secondary | ICD-10-CM | POA: Diagnosis not present

## 2011-08-23 ENCOUNTER — Encounter: Payer: Medicare Other | Admitting: Internal Medicine

## 2011-09-03 ENCOUNTER — Encounter: Payer: Self-pay | Admitting: Internal Medicine

## 2011-09-03 ENCOUNTER — Ambulatory Visit (INDEPENDENT_AMBULATORY_CARE_PROVIDER_SITE_OTHER): Payer: Medicare Other | Admitting: Internal Medicine

## 2011-09-03 VITALS — BP 126/80 | HR 65 | Temp 98.1°F | Resp 12 | Ht 66.25 in | Wt 131.0 lb

## 2011-09-03 DIAGNOSIS — N289 Disorder of kidney and ureter, unspecified: Secondary | ICD-10-CM

## 2011-09-03 DIAGNOSIS — N183 Chronic kidney disease, stage 3 unspecified: Secondary | ICD-10-CM | POA: Insufficient documentation

## 2011-09-03 DIAGNOSIS — H8390 Unspecified disease of inner ear, unspecified ear: Secondary | ICD-10-CM

## 2011-09-03 DIAGNOSIS — I10 Essential (primary) hypertension: Secondary | ICD-10-CM

## 2011-09-03 DIAGNOSIS — H8190 Unspecified disorder of vestibular function, unspecified ear: Secondary | ICD-10-CM | POA: Diagnosis not present

## 2011-09-03 DIAGNOSIS — E039 Hypothyroidism, unspecified: Secondary | ICD-10-CM

## 2011-09-03 DIAGNOSIS — Z0000 Encounter for general adult medical examination without abnormal findings: Secondary | ICD-10-CM | POA: Diagnosis not present

## 2011-09-03 DIAGNOSIS — N184 Chronic kidney disease, stage 4 (severe): Secondary | ICD-10-CM | POA: Insufficient documentation

## 2011-09-03 DIAGNOSIS — Z23 Encounter for immunization: Secondary | ICD-10-CM | POA: Diagnosis not present

## 2011-09-03 DIAGNOSIS — E782 Mixed hyperlipidemia: Secondary | ICD-10-CM

## 2011-09-03 DIAGNOSIS — M810 Age-related osteoporosis without current pathological fracture: Secondary | ICD-10-CM

## 2011-09-03 DIAGNOSIS — T887XXA Unspecified adverse effect of drug or medicament, initial encounter: Secondary | ICD-10-CM

## 2011-09-03 NOTE — Patient Instructions (Signed)
Preventive Health Care: Exercise  30-45  minutes a day, 3-4 days a week. Walking is especially valuable in preventing Osteoporosis. Eat a low-fat diet with lots of fruits and vegetables, up to 7-9 servings per day. Consume less than 30 grams of sugar per day from foods & drinks with High Fructose Corn Syrup as # 1,2,3 or #4 on label.  Blood Pressure Goal  Ideally is an AVERAGE < 135/85. This AVERAGE should be calculated from @ least 5-7 BP readings taken @ different times of day on different days of week. You should not respond to isolated BP readings , but rather the AVERAGE for that week

## 2011-09-03 NOTE — Progress Notes (Signed)
Subjective:    Patient ID: Deborah Shields, female    DOB: 11/12/1940, 71 y.o.   MRN: [id number]  HPI Medicare Wellness Visit:  The following psychosocial & medical history were reviewed as required by Medicare.   Social history: caffeine:1 coke & 1 cup coffee , alcohol:  none ,  tobacco use : never  & exercise : walking & limb exercise.   Home & personal  safety / fall risk:inner ear, takes Meclizine 1-2 daily as needed  ( Beer's article risk discussed), activities of daily living: no limitations , seatbelt use : yes , and smoke alarm employment : yes .  Power of Attorney/Living Will status : in place  Vision ( as recorded per Nurse) & Hearing  evaluation :  See exam Orientation :oriented x 3 , memory & recall : good,  math testing: good,and mood & affect : normal . Depression / anxiety: denied Travel history : [place] 2008 , immunization status :? No PNA shot , transfusion history:  no, and preventive health surveillance ( colonoscopies, BMD , etc as per protocol/ [facility]): colonoscopy last Fall, Dental care:  Seen every 6 months . Chart reviewed &  Updated. Active issues reviewed & addressed.        Review of Systems Her Lipidologist has her on Trilipix; LDL is now  @ goal (115) . HDL remains protective at 64. Her creatinine has increased to 1.6. The risk of polypharmacy and taking agents such as Celebrex Rxed by her Rheumatologist and meclizine in the context of polypharmacy was discussed in detail. There is a phenomenally  strong family history of valvular heart disease; 2 brothers and 2 sisters apparently had both aortic and mitral valve replacements. She is inquiring about the need for subacute bacterial endocarditis prophylaxis prior to dental cleansing. This should be addressed by her cardiologist who performed an echocardiogram. By voice report from her it did not show significant valvular disease. Unfortunately those results are not in the chart. Blood pressure is well controlled at home  based on her readings. She denies chest pain, palpitations, medications, or edema.     Objective:   Physical Exam Gen.: Thin but healthy and well-nourished in appearance. Alert, appropriate and cooperative throughout exam. Head: Normocephalic without obvious abnormalities  Eyes: No corneal or conjunctival inflammation noted.  Extraocular motion intact. Vision grossly normal. Ears: External  ear exam reveals no significant lesions or deformities. Canals clear .TMs normal. Hearing is grossly normal bilaterally. Nose: External nasal exam reveals no deformity or inflammation. Nasal mucosa are pink and moist. No lesions or exudates noted.  Mouth: Oral mucosa and oropharynx reveal no lesions or exudates. Teeth in good repair. Neck: No deformities, masses, or tenderness noted. Range of motion slightly decreased;Thyroid normal  Lungs: Normal respiratory effort; chest expands symmetrically. Lungs are clear to auscultation without rales, wheezes, or increased work of breathing. Heart: Normal rate and rhythm. Normal S1 and S2. No gallop, click, or rub. Grade 1/2 over 6 systolic murmur  R base. Abdomen: Bowel sounds normal; abdomen soft and nontender. No masses, organomegaly or hernias noted.  Musculoskeletal/extremities: Lordosis  noted of  the thoracic . No clubbing, cyanosis, edema noted. Tone & strength  Normal.Joints: marked OA hand changes normal. Nail health  Good.Op scars UE .  Vascular: Carotid, radial artery pulses are full and equal. No bruits present.Decreased pedal pulses Neurologic: Alert and oriented x3. Deep tendon reflexes symmetrical and normal.          Skin: Intact without suspicious lesions or rashes. Lipomata of RUE Lymph: No cervical, axillary lymphadenopathy present. Psych: Mood and affect are normal. Normally interactive                                                                                          Assessment & Plan:  #1 Medicare Wellness Exam; criteria met ; data entered #2 Problem List reviewed ; Assessment/ Recommendations made  #3 polypharmacy an elderly patient with increased creatinine; risk were discussed especially in reference to agents such as Celebrex, lorazepam, and meclizine.  #4 Inner ear dysfunctiondysfunction for which she takes meclizine on a regular basis. This is not been evaluated for extended period time an ENT reassessment will be recommended to define optimal therapy. Plan: see Orders

## 2011-09-03 NOTE — Assessment & Plan Note (Signed)
She states that TSH was therapeutic on SYNTHROID ; no TSH is on the 07/16/11 results

## 2011-09-03 NOTE — Assessment & Plan Note (Signed)
Blood pressure is well controlled. Her creatinine was 1.6 on 07/16/11

## 2011-09-05 MED ORDER — LOSARTAN POTASSIUM 100 MG PO TABS: 100.0000 mg | ORAL_TABLET | Freq: Every day | ORAL | Status: DC

## 2011-09-05 MED ORDER — AMLODIPINE BESYLATE 5 MG PO TABS: 5.0000 mg | ORAL_TABLET | Freq: Every day | ORAL | Status: DC

## 2011-09-05 MED ORDER — METOPROLOL TARTRATE 50 MG PO TABS: ORAL_TABLET | ORAL | Status: DC

## 2011-09-11 DIAGNOSIS — M949 Disorder of cartilage, unspecified: Secondary | ICD-10-CM | POA: Diagnosis not present

## 2011-09-11 DIAGNOSIS — M899 Disorder of bone, unspecified: Secondary | ICD-10-CM | POA: Diagnosis not present

## 2011-09-12 ENCOUNTER — Other Ambulatory Visit: Payer: Self-pay

## 2011-09-12 DIAGNOSIS — F411 Generalized anxiety disorder: Secondary | ICD-10-CM

## 2011-09-12 MED ORDER — LORAZEPAM 0.5 MG PO TABS: 0.5000 mg | ORAL_TABLET | Freq: Three times a day (TID) | ORAL | Status: DC | PRN

## 2011-09-12 MED ORDER — TRAMADOL HCL 50 MG PO TABS: ORAL_TABLET | ORAL | Status: DC

## 2011-09-12 NOTE — Telephone Encounter (Signed)
#   90 of both; Lorazepam should be taken prn only due to age related risks

## 2011-09-12 NOTE — Telephone Encounter (Signed)
Patient left message on voicemail stating when she was last here she discussed getting all her meds filled, 2 were left off Lorazepam and Tramadol. Patient would like these sent to mail order company.   Dr.Hopper please advise, medications marked as pending. Please review dispense numbers

## 2011-09-12 NOTE — Telephone Encounter (Signed)
RXs sent.

## 2011-09-18 DIAGNOSIS — H9319 Tinnitus, unspecified ear: Secondary | ICD-10-CM | POA: Diagnosis not present

## 2011-09-18 DIAGNOSIS — R42 Dizziness and giddiness: Secondary | ICD-10-CM | POA: Diagnosis not present

## 2011-10-04 ENCOUNTER — Encounter: Payer: Self-pay | Admitting: Internal Medicine

## 2011-10-21 DIAGNOSIS — H04129 Dry eye syndrome of unspecified lacrimal gland: Secondary | ICD-10-CM | POA: Diagnosis not present

## 2011-10-21 DIAGNOSIS — H43819 Vitreous degeneration, unspecified eye: Secondary | ICD-10-CM | POA: Diagnosis not present

## 2011-10-21 DIAGNOSIS — H16109 Unspecified superficial keratitis, unspecified eye: Secondary | ICD-10-CM | POA: Diagnosis not present

## 2011-10-21 DIAGNOSIS — Z961 Presence of intraocular lens: Secondary | ICD-10-CM | POA: Diagnosis not present

## 2011-11-12 DIAGNOSIS — E785 Hyperlipidemia, unspecified: Secondary | ICD-10-CM | POA: Diagnosis not present

## 2011-11-13 DIAGNOSIS — I4891 Unspecified atrial fibrillation: Secondary | ICD-10-CM | POA: Diagnosis not present

## 2011-11-13 DIAGNOSIS — I10 Essential (primary) hypertension: Secondary | ICD-10-CM | POA: Diagnosis not present

## 2011-11-19 DIAGNOSIS — E0789 Other specified disorders of thyroid: Secondary | ICD-10-CM | POA: Diagnosis not present

## 2011-11-19 DIAGNOSIS — M6281 Muscle weakness (generalized): Secondary | ICD-10-CM | POA: Diagnosis not present

## 2011-11-19 DIAGNOSIS — I10 Essential (primary) hypertension: Secondary | ICD-10-CM | POA: Diagnosis not present

## 2011-11-19 DIAGNOSIS — E789 Disorder of lipoprotein metabolism, unspecified: Secondary | ICD-10-CM | POA: Diagnosis not present

## 2011-12-03 ENCOUNTER — Other Ambulatory Visit: Payer: Self-pay | Admitting: Dermatology

## 2011-12-03 DIAGNOSIS — L570 Actinic keratosis: Secondary | ICD-10-CM | POA: Diagnosis not present

## 2011-12-03 DIAGNOSIS — L578 Other skin changes due to chronic exposure to nonionizing radiation: Secondary | ICD-10-CM | POA: Diagnosis not present

## 2011-12-03 DIAGNOSIS — L821 Other seborrheic keratosis: Secondary | ICD-10-CM | POA: Diagnosis not present

## 2011-12-16 DIAGNOSIS — R252 Cramp and spasm: Secondary | ICD-10-CM | POA: Diagnosis not present

## 2011-12-16 DIAGNOSIS — E785 Hyperlipidemia, unspecified: Secondary | ICD-10-CM | POA: Diagnosis not present

## 2011-12-18 DIAGNOSIS — E789 Disorder of lipoprotein metabolism, unspecified: Secondary | ICD-10-CM | POA: Diagnosis not present

## 2011-12-18 DIAGNOSIS — M79609 Pain in unspecified limb: Secondary | ICD-10-CM | POA: Diagnosis not present

## 2012-02-20 DIAGNOSIS — Z23 Encounter for immunization: Secondary | ICD-10-CM | POA: Diagnosis not present

## 2012-03-16 ENCOUNTER — Other Ambulatory Visit: Payer: Self-pay | Admitting: Internal Medicine

## 2012-03-16 DIAGNOSIS — Z1231 Encounter for screening mammogram for malignant neoplasm of breast: Secondary | ICD-10-CM

## 2012-04-01 ENCOUNTER — Encounter: Payer: Self-pay | Admitting: Neurosurgery

## 2012-04-02 ENCOUNTER — Ambulatory Visit (INDEPENDENT_AMBULATORY_CARE_PROVIDER_SITE_OTHER): Payer: Medicare Other | Admitting: Neurosurgery

## 2012-04-02 ENCOUNTER — Other Ambulatory Visit (INDEPENDENT_AMBULATORY_CARE_PROVIDER_SITE_OTHER): Payer: Medicare Other | Admitting: Vascular Surgery

## 2012-04-02 ENCOUNTER — Encounter: Payer: Self-pay | Admitting: Neurosurgery

## 2012-04-02 VITALS — BP 131/87 | HR 57 | Resp 18 | Ht 66.5 in | Wt 133.0 lb

## 2012-04-02 DIAGNOSIS — I6529 Occlusion and stenosis of unspecified carotid artery: Secondary | ICD-10-CM | POA: Diagnosis not present

## 2012-04-02 NOTE — Progress Notes (Signed)
[facility] Carotid Office Note  CC: Carotid surveillance Referring Physician: Fields  History of Present Illness: 71 year old female patient of Dr. Oneida Shields with known history of carotid intervention. The patient denies any signs or symptoms of CVA, TIA, amaurosis fugax or any neural deficit. The patient denies any new medical diagnoses or recent surgery.  Past Medical History  Diagnosis Date  . Diverticulosis of colon   . Hypothyroidism     Dr Wilson Singer  . Hyperlipidemia     NMR 07/2009: LDL 200 (2260/1233)TG 99, HDL 65. LDL goal =<120, ideally <100. father MI @ 39  . Hypertension   . Microscopic hematuria   . Degenerative joint disease   . Peripheral vascular disease     ICA bilat, Dr.Charles fields, [facility]  . Carotid artery occlusion   . Heart murmur   . Anxiety   . Basal cell carcinoma 2007    dr [place]/left  . Peripheral neuropathy     compressive    ROS: [x]  Positive   [ ]  Denies    General: [ ]  Weight loss, [ ]  Fever, [ ]  chills Neurologic: [ ]  Dizziness, [ ]  Blackouts, [ ]  Seizure [ ]  Stroke, [ ]  "Mini stroke", [ ]  Slurred speech, [ ]  Temporary blindness; [ ]  weakness in arms or legs, [ ]  Hoarseness Cardiac: [ ]  Chest pain/pressure, [ ]  Shortness of breath at rest [ ]  Shortness of breath with exertion, [ ]  Atrial fibrillation or irregular heartbeat Vascular: [ ]  Pain in legs with walking, [ ]  Pain in legs at rest, [ ]  Pain in legs at night,  [ ]  Non-healing ulcer, [ ]  Blood clot in vein/DVT,   Pulmonary: [ ]  Home oxygen, [ ]  Productive cough, [ ]  Coughing up blood, [ ]  Asthma,  [ ]  Wheezing Musculoskeletal:  [ ]  Arthritis, [ ]  Low back pain, [ ]  Joint pain Hematologic: [ ]  Easy Bruising, [ ]  Anemia; [ ]  Hepatitis Gastrointestinal: [ ]  Blood in stool, [ ]  Gastroesophageal Reflux/heartburn, [ ]  Trouble swallowing Urinary: [ ]  chronic Kidney disease, [ ]  on HD - [ ]  MWF or [ ]  TTHS, [ ]  Burning with urination, [ ]  Difficulty urinating Skin: [ ]   Rashes, [ ]  Wounds Psychological: [ ]  Anxiety, [ ]  Depression   Social History History  Substance Use Topics  . Smoking status: Never Smoker   . Smokeless tobacco: Never Used  . Alcohol Use: No    Family History Family History  Problem Relation Age of Onset  . Heart attack Father 51  . Colon cancer Mother 46  . Stroke Mother 11    CVA  . Ovarian cancer Sister   . Other Sister     Valve replacement (aortic & mitral ) in 2 sisters  . Heart disease Sister   . Kidney disease Sister   . Coronary artery disease    . Heart disease Brother     aortic & mitral valve replacement in 2 bro; both had CBAG    Allergies  Allergen Reactions  . Penicillins     REACTION: itching and edema  . Aspirin     REACTION: GI upset  . Bupropion Hcl     REACTION: tinnitis  . Codeine     REACTION: GI upset  . Ezetimibe     REACTION: GI symptoms  . Lovastatin     REACTION: nausea  . Paroxetine   . Pravastatin Sodium     REACTION: elevated CPK  Current Outpatient Prescriptions  Medication Sig Dispense Refill  . amLODipine (NORVASC) 5 MG tablet Take 1 tablet (5 mg total) by mouth daily.  90 tablet  3  . aspirin 81 MG tablet Take 81 mg by mouth daily.        Marland Kitchen b complex vitamins tablet Take 1 tablet by mouth daily.        . Calcium Carbonate-Vitamin D (CALCIUM-VITAMIN D) 600-200 MG-UNIT CAPS Take by mouth.        . celecoxib (CELEBREX) 200 MG capsule 1-2 tabs daily.       . Cholecalciferol (VITAMIN D) 1000 UNITS capsule Take 1,000 Units by mouth daily.        . Choline Fenofibrate 135 MG capsule Take 135 mg by mouth daily. [id number] by Dr.Kohut      . cycloSPORINE (RESTASIS) 0.05 % ophthalmic emulsion 1 drop 2 (two) times daily.        Marland Kitchen levothyroxine (SYNTHROID) 88 MCG tablet Take 88 mcg by mouth daily. Taking the brand "synthroid"       . LORazepam (ATIVAN) 0.5 MG tablet Take 1 tablet (0.5 mg total) by mouth 3 (three) times daily as needed.  90 tablet  0  . losartan (COZAAR) 100 MG tablet  Take 1 tablet (100 mg total) by mouth daily.  90 tablet  3  . meclizine (ANTIVERT) 25 MG tablet Take 1 tablet (25 mg total) by mouth daily. 1/2 every 8 hrs prn for imbalance  30 tablet  2  . metoprolol (LOPRESSOR) 50 MG tablet 1/2 by mouth two times daily  90 tablet  3  . Multiple Vitamins-Minerals (CENTRUM SILVER PO) Take by mouth.        . traMADol (ULTRAM) 50 MG tablet 1/2-1 by mouth once daily as needed.  90 tablet  0  . vitamin E 100 UNIT capsule Take 100 Units by mouth daily.       . Colesevelam HCl [facility]) 3.75 G PACK Take by mouth. Take 3.75 gm in 4-8 oz. water or fruit juice daily         Physical Examination  Filed Vitals:   04/02/12 1557  BP: 131/87  Pulse: 57  Resp:     Body mass index is 21.15 kg/(m^2).  General:  WDWN in NAD Gait: Normal HEENT: WNL Eyes: Pupils equal Pulmonary: normal non-labored breathing , without Rales, rhonchi,  wheezing Cardiac: RRR, without  Murmurs, rubs or gallops; Abdomen: soft, NT, no masses Skin: no rashes, ulcers noted  Vascular Exam Pulses: 2+ radial pulses bilaterally Carotid bruits: Dampened carotid pulses bilaterally to auscultation no bruits are heard Extremities without ischemic changes, no Gangrene , no cellulitis; no open wounds;  Musculoskeletal: no muscle wasting or atrophy   Neurologic: A&O X 3; Appropriate Affect ; SENSATION: normal; MOTOR FUNCTION:  moving all extremities equally. Speech is fluent/normal  Non-Invasive Vascular Imaging CAROTID DUPLEX 04/02/2012  Right ICA 60 - 79 % stenosis Left ICA 60 - 79 % stenosis   ASSESSMENT/PLAN: Asymptomatic patient with 60s 79% bilateral stenosis possible. The right may be due to a kink in the vessel in the left may represent fibromuscular dysplasia which was noted on MRA in January 2006, there is no change from previous exam one year ago. The patient knows the signs and symptoms of CVA and knows to report to the nearest emergency department should that occur. The patient's  questions were encouraged and answered, she is in agreement with this plan.  Deborah Shields ANP   Clinic MD: Deborah Shields

## 2012-04-03 NOTE — Addendum Note (Signed)
Addended by: Mena Goes on: 04/03/2012 08:35 AM   Modules accepted: Orders

## 2012-04-08 DIAGNOSIS — E789 Disorder of lipoprotein metabolism, unspecified: Secondary | ICD-10-CM | POA: Diagnosis not present

## 2012-04-08 DIAGNOSIS — E0789 Other specified disorders of thyroid: Secondary | ICD-10-CM | POA: Diagnosis not present

## 2012-04-15 DIAGNOSIS — E0789 Other specified disorders of thyroid: Secondary | ICD-10-CM | POA: Diagnosis not present

## 2012-04-15 DIAGNOSIS — E789 Disorder of lipoprotein metabolism, unspecified: Secondary | ICD-10-CM | POA: Diagnosis not present

## 2012-04-21 DIAGNOSIS — Z01419 Encounter for gynecological examination (general) (routine) without abnormal findings: Secondary | ICD-10-CM | POA: Diagnosis not present

## 2012-04-21 DIAGNOSIS — Z1212 Encounter for screening for malignant neoplasm of rectum: Secondary | ICD-10-CM | POA: Diagnosis not present

## 2012-04-21 DIAGNOSIS — E0789 Other specified disorders of thyroid: Secondary | ICD-10-CM | POA: Diagnosis not present

## 2012-06-03 ENCOUNTER — Ambulatory Visit
Admission: RE | Admit: 2012-06-03 | Discharge: 2012-06-03 | Disposition: A | Discharge status: Home / Self Care | Payer: Medicare Other | Source: Ambulatory Visit | Attending: Internal Medicine | Admitting: Internal Medicine

## 2012-06-03 DIAGNOSIS — Z1231 Encounter for screening mammogram for malignant neoplasm of breast: Secondary | ICD-10-CM | POA: Diagnosis not present

## 2012-08-12 DIAGNOSIS — E789 Disorder of lipoprotein metabolism, unspecified: Secondary | ICD-10-CM | POA: Diagnosis not present

## 2012-08-19 DIAGNOSIS — I10 Essential (primary) hypertension: Secondary | ICD-10-CM | POA: Diagnosis not present

## 2012-08-19 DIAGNOSIS — E0789 Other specified disorders of thyroid: Secondary | ICD-10-CM | POA: Diagnosis not present

## 2012-08-19 DIAGNOSIS — E789 Disorder of lipoprotein metabolism, unspecified: Secondary | ICD-10-CM | POA: Diagnosis not present

## 2012-08-26 ENCOUNTER — Telehealth: Payer: Self-pay | Admitting: Gastroenterology

## 2012-08-26 NOTE — Telephone Encounter (Signed)
Pt has pain around the rib cage and burning after eating x 1-2 weeks.  Does not have gallbladder.  She is not taking any PPI or H2 blocker.  She was advised to try OTC prilosec and appt has been made for 08/28/12

## 2012-08-28 ENCOUNTER — Ambulatory Visit: Payer: Medicare Other | Admitting: Gastroenterology

## 2012-09-04 ENCOUNTER — Encounter: Payer: Self-pay | Admitting: Lab

## 2012-09-07 ENCOUNTER — Ambulatory Visit (INDEPENDENT_AMBULATORY_CARE_PROVIDER_SITE_OTHER): Payer: Medicare Other | Admitting: Internal Medicine

## 2012-09-07 ENCOUNTER — Encounter: Payer: Self-pay | Admitting: Internal Medicine

## 2012-09-07 VITALS — BP 132/78 | HR 67 | Temp 97.9°F | Resp 14 | Ht 66.0 in | Wt 132.0 lb

## 2012-09-07 DIAGNOSIS — R319 Hematuria, unspecified: Secondary | ICD-10-CM

## 2012-09-07 DIAGNOSIS — R3915 Urgency of urination: Secondary | ICD-10-CM | POA: Diagnosis not present

## 2012-09-07 DIAGNOSIS — M1990 Unspecified osteoarthritis, unspecified site: Secondary | ICD-10-CM

## 2012-09-07 DIAGNOSIS — M899 Disorder of bone, unspecified: Secondary | ICD-10-CM | POA: Diagnosis not present

## 2012-09-07 DIAGNOSIS — Z0000 Encounter for general adult medical examination without abnormal findings: Secondary | ICD-10-CM | POA: Diagnosis not present

## 2012-09-07 DIAGNOSIS — I10 Essential (primary) hypertension: Secondary | ICD-10-CM | POA: Diagnosis not present

## 2012-09-07 DIAGNOSIS — E782 Mixed hyperlipidemia: Secondary | ICD-10-CM | POA: Diagnosis not present

## 2012-09-07 DIAGNOSIS — F411 Generalized anxiety disorder: Secondary | ICD-10-CM

## 2012-09-07 DIAGNOSIS — M8580 Other specified disorders of bone density and structure, unspecified site: Secondary | ICD-10-CM

## 2012-09-07 DIAGNOSIS — M949 Disorder of cartilage, unspecified: Secondary | ICD-10-CM

## 2012-09-07 LAB — POCT URINALYSIS DIPSTICK
Bilirubin, UA: NEGATIVE
Glucose, UA: NEGATIVE
Ketones, UA: NEGATIVE
Leukocytes, UA: NEGATIVE
Nitrite, UA: NEGATIVE
Protein, UA: NEGATIVE
Spec Grav, UA: 1.01
Urobilinogen, UA: 0.2
pH, UA: 7.5

## 2012-09-07 MED ORDER — TRAMADOL HCL 50 MG PO TABS: ORAL_TABLET | ORAL | Status: DC

## 2012-09-07 MED ORDER — AMLODIPINE BESYLATE 5 MG PO TABS: 5.0000 mg | ORAL_TABLET | Freq: Every day | ORAL | Status: DC

## 2012-09-07 MED ORDER — LORAZEPAM 0.5 MG PO TABS: 0.5000 mg | ORAL_TABLET | Freq: Three times a day (TID) | ORAL | Status: DC | PRN

## 2012-09-07 MED ORDER — LOSARTAN POTASSIUM 100 MG PO TABS: 100.0000 mg | ORAL_TABLET | Freq: Every day | ORAL | Status: DC

## 2012-09-07 NOTE — Addendum Note (Signed)
Addended by: Logan Bores on: 09/07/2012 11:36 AM   Modules accepted: Orders

## 2012-09-07 NOTE — Progress Notes (Signed)
Subjective:    Patient ID: Deborah Shields, female    DOB: [birth year], 72 y.o.   MRN: [id number]  HPI Medicare Wellness Visit:  Psychosocial & medical history were reviewed as required by Medicare (abuse,antisocial behavioral risks,firearm risk).  Social history: caffeine:1>5 cups coffee & 1 cola daily  , alcohol: no  ,  tobacco use: never Exercise : walking 5 miles / week No home & personal  safety / fall risk Activities of daily living: no limitations  Seatbelt  and smoke alarm employed. Power of Attorney/Living Will status : in place Ophthalmology exam current Hearing evaluation not current Orientation :oriented X 3  Memory & recall :good Spelling testing:good Mood & affect : normal . Depression / anxiety: denied Travel history : last 2008 [place]  Immunization status :current Shingles /Flu/ PNA/ tetanus  Transfusion history:  none  Preventive health surveillance ( colonoscopies, BMD , mammograms,PAP as per protocol/ SOC): current colonoscopy .Mammogram /BMD/ PAP done Dental care:  Every 6 mos. Chart reviewed &  Updated. Active issues reviewed & addressed.      Review of Systems Labs dated 08/12/12 were reviewed. LDL was 150; HDL 53; and triglycerides 149. Her advanced cholesterol panel revealed a total of 2389 particles with 1084 small dense particles. LP-IR 44.Creatinine 1.5 & GFR 36.09.  She is being evaluated for possible participation in a cholesterol lowering trial with a new agent which is being studied. The trial is to involve individuals who've been intolerant to multiple statins.   The bone density images and report were reviewed. She does have significant osteopenia in the femoral neck; but there's been a 9.1% improvement in the T score of the lumbosacral spine. Vitamin D level is not current. 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 Dr Trudie Reed may have checked vitamin D level > 12  months ago.   She describes urgency and oliguria for 7-10 days. There is no associated dysuria, hematuria, or pyuria. She also denies flank pain, fever, chills, or sweats     Objective:   Physical Exam Gen.: Thin but healthy and well-nourished in appearance. Alert, appropriate and cooperative throughout exam.Appears younger than stated age  Head: Normocephalic without obvious abnormalities   Eyes: No corneal or conjunctival inflammation noted. Extraocular motion intact. Vision grossly normal without lenses Ears: External  ear exam reveals no significant lesions or deformities. Canals clear .TMs normal. Hearing is grossly normal bilaterally. Nose: External nasal exam reveals no deformity or inflammation. Nasal mucosa are pink and moist. No lesions or exudates noted.  Mouth: Oral mucosa and oropharynx reveal no lesions or exudates. Upper plate. Neck: No deformities, masses, or tenderness noted. Range of motion & Thyroid normal. Lungs: Normal respiratory effort; chest expands symmetrically. Lungs are clear to auscultation without rales, wheezes, or increased work of breathing. Heart: Normal rate and rhythm. Normal S1 and S2. No gallop, click, or rub. Grade 1/6 systolic murmur LSB. Abdomen: Bowel sounds normal; abdomen soft and nontender. No masses, organomegaly or hernias noted. Genitalia: As per PA                                  Musculoskeletal/extremities: No deformity or scoliosis noted of  the thoracic or lumbar spine.  No clubbing, cyanosis,or  Edema noted. Range of motion normal .Tone & strength  Normal. Joints reveal severe DIP & PIP  changes. Nail health good.  Able to lie down & sit up w/o help. Negative SLR bilaterally Vascular: Carotid & radial artery  pulses are full and equal. No bruits present.Decreased dorsalis pedis and  posterior tibial pulses Neurologic: Alert and oriented x3. Deep tendon reflexes symmetrical and normal.        Skin: Intact without suspicious lesions or  rashes. Lymph: No cervical, axillary lymphadenopathy present. Psych: Mood and affect are normal. Normally interactive                                                                                        Assessment & Plan:  #1 Medicare Wellness Exam; criteria met ; data entered #2 Problem List reviewed ; Assessment/ Recommendations made Plan: see Orders

## 2012-09-07 NOTE — Patient Instructions (Addendum)
Review and correct the record as indicated. Please share record with all medical staff seen.  If you activate the  My Chart system; lab & Xray results will be released directly  to you as soon as I review & address these through the computer. If you choose not to sign up for My Chart within 36 hours of labs being drawn; results will be reviewed & interpretation added before being copied & mailed, causing a delay in getting the results to you.If you do not receive that report within 7-10 days ,please call. Additionally you can use this system to gain direct  access to your records  if  out of town or @ an office of a  physician who is not in  the My Chart network.  This improves continuity of care & places you in control of your medical record.

## 2012-09-07 NOTE — Addendum Note (Signed)
Addended by: Modena Morrow D on: 09/07/2012 02:28 PM   Modules accepted: Orders

## 2012-09-08 LAB — URINE CULTURE
Colony Count: NO GROWTH
Organism ID, Bacteria: NO GROWTH

## 2012-09-09 DIAGNOSIS — Z79899 Other long term (current) drug therapy: Secondary | ICD-10-CM | POA: Diagnosis not present

## 2012-09-28 ENCOUNTER — Ambulatory Visit (INDEPENDENT_AMBULATORY_CARE_PROVIDER_SITE_OTHER): Payer: Medicare Other | Admitting: Vascular Surgery

## 2012-09-28 DIAGNOSIS — I6529 Occlusion and stenosis of unspecified carotid artery: Secondary | ICD-10-CM | POA: Diagnosis not present

## 2012-09-28 DIAGNOSIS — I7789 Other specified disorders of arteries and arterioles: Secondary | ICD-10-CM

## 2012-09-28 NOTE — Progress Notes (Signed)
carotid duplex performed @ [facility] 09/28/2012

## 2012-09-29 ENCOUNTER — Other Ambulatory Visit: Payer: Self-pay

## 2012-09-29 DIAGNOSIS — I7789 Other specified disorders of arteries and arterioles: Secondary | ICD-10-CM

## 2012-09-30 ENCOUNTER — Encounter: Payer: Self-pay | Admitting: Vascular Surgery

## 2012-10-01 ENCOUNTER — Other Ambulatory Visit: Payer: Medicare Other

## 2012-10-01 ENCOUNTER — Ambulatory Visit: Payer: Medicare Other | Admitting: Neurosurgery

## 2012-10-12 ENCOUNTER — Encounter: Payer: Self-pay | Admitting: Internal Medicine

## 2012-10-21 DIAGNOSIS — H04129 Dry eye syndrome of unspecified lacrimal gland: Secondary | ICD-10-CM | POA: Diagnosis not present

## 2012-10-21 DIAGNOSIS — H2640 Unspecified secondary cataract: Secondary | ICD-10-CM | POA: Diagnosis not present

## 2012-10-21 DIAGNOSIS — Z961 Presence of intraocular lens: Secondary | ICD-10-CM | POA: Diagnosis not present

## 2012-10-21 DIAGNOSIS — H43819 Vitreous degeneration, unspecified eye: Secondary | ICD-10-CM | POA: Diagnosis not present

## 2012-11-02 ENCOUNTER — Telehealth: Payer: Self-pay | Admitting: Gastroenterology

## 2012-11-02 NOTE — Telephone Encounter (Signed)
Line rings busy  

## 2012-11-04 NOTE — Telephone Encounter (Signed)
Pt has a mass in her rectum that is the size of the end other thumb.  No pain with it just irritating.  No blood.  First noticed this on Sunday, pt states she does not think it is a hemorrhoid.  Pt given appt with Jessica for 11/05/12 130 pm

## 2012-11-05 ENCOUNTER — Ambulatory Visit (INDEPENDENT_AMBULATORY_CARE_PROVIDER_SITE_OTHER): Payer: Medicare Other | Admitting: Gastroenterology

## 2012-11-05 ENCOUNTER — Encounter: Payer: Self-pay | Admitting: Gastroenterology

## 2012-11-05 VITALS — BP 122/82 | HR 78 | Ht 66.75 in | Wt 134.1 lb

## 2012-11-05 DIAGNOSIS — R1013 Epigastric pain: Secondary | ICD-10-CM

## 2012-11-05 DIAGNOSIS — R197 Diarrhea, unspecified: Secondary | ICD-10-CM | POA: Diagnosis not present

## 2012-11-05 DIAGNOSIS — K649 Unspecified hemorrhoids: Secondary | ICD-10-CM | POA: Diagnosis not present

## 2012-11-05 MED ORDER — HYDROCORTISONE 2.5 % RE CREA: TOPICAL_CREAM | Freq: Two times a day (BID) | RECTAL | Status: DC

## 2012-11-05 NOTE — Progress Notes (Signed)
11/05/2012 Deborah Shields:[id number] May 10, 1941   History of Present Illness:  Patient is a pleasant 72 year old female who is a patient of Dr. Ardis Shields.  She comes to the office to be seen for a mass sticking out of her rectum/anus.  Does not hurt or bleed, but is bothersome and uncomfortable.  Had a colonoscopy in 05/2011 that was normal.  Says that her BM's are normal for the most part and she does not strain for any reason.  While she is here, she also mentions that she has an episode of diarrhea intermittently with accidents/incontinence.  This seems to occur mostly if she eats out at a restaurant somewhere.  Says that it is random in occurrence.  Denies abdominal pain and blood in stool.  Also asks for samples of Protonix.  Says that she takes it on very rare occasions when she gets some epigastric discomfort.  Says that she Dr. Velora Shields used to give her samples and she used up the last ones that she had at home (were expired).    Current Medications, Allergies, Past Medical History, Past Surgical History, Family History and Social History were reviewed in [employer] record.   Physical Exam: BP 122/82  Pulse 78  Ht 5' 6.75" (1.695 m)  Wt 134 lb 2 oz (60.839 kg)  BMI 21.18 kg/m2 General: Well developed, female in no acute distress Head: Normocephalic and atraumatic Eyes:  sclerae anicteric, conjunctiva pink  Ears: Normal auditory acuity Abdomen: Soft, non-tender and non-distended. No masses, no hepatomegaly. Normal bowel sounds. Rectal: Large, firm, prolapsed internal hemorrhoid noted.  Easily reducible without pain on DRE.  No other hemorrhoids noted. Musculoskeletal: Symmetrical with no gross deformities  Extremities: No edema  Neurological: Alert oriented x 4, grossly nonfocal Psychological:  Alert and cooperative. Normal mood and affect  Assessment and Recommendations: -Internal hemorrhoid:  Large, firm, non-tender internal hemorrhoid on rectal exam.  Will  treat with hydrocortisone suppositories BID x 10 days.  She was asking about hemorrhoid removal, but I told her to try the suppositories first and to call back for an appointment with MD to discuss banding or this new hemorrhoid ligation that Dr. Carlean Shields will be performing if it continues to be bothersome to her. -Intermittent diarrhea:  Random occurrences seems mostly when she eats out at restaurants.  Could try 1/2-1 Imodium tablet prior to dining out. -Epigastric pain/indigestion:  Intermittent occurrences since she was seeing Dr. Velora Shields.  Takes protonix prn and is asking for samples.  Told her that we do not get samples of that medication anymore, but she was given some Zegerid samples to try instead.

## 2012-11-05 NOTE — Patient Instructions (Addendum)
We are sending in your prescription to your pharmacy We have given you Zegerid samples today

## 2012-11-06 NOTE — Progress Notes (Signed)
i agree with the note above

## 2012-11-11 DIAGNOSIS — E0789 Other specified disorders of thyroid: Secondary | ICD-10-CM | POA: Diagnosis not present

## 2012-11-11 DIAGNOSIS — E789 Disorder of lipoprotein metabolism, unspecified: Secondary | ICD-10-CM | POA: Diagnosis not present

## 2012-11-13 ENCOUNTER — Telehealth: Payer: Self-pay | Admitting: Gastroenterology

## 2012-11-13 NOTE — Telephone Encounter (Signed)
Spoke with patient and she is still having problems with painful hemorrhoids. She wants to have something done soon about this. Scheduled for OV on 12/08/12 at 3:30 PM but she does not think she can stand it until then. Please, advise.

## 2012-11-17 DIAGNOSIS — I10 Essential (primary) hypertension: Secondary | ICD-10-CM | POA: Diagnosis not present

## 2012-11-17 DIAGNOSIS — E789 Disorder of lipoprotein metabolism, unspecified: Secondary | ICD-10-CM | POA: Diagnosis not present

## 2012-11-17 DIAGNOSIS — E0789 Other specified disorders of thyroid: Secondary | ICD-10-CM | POA: Diagnosis not present

## 2012-11-17 DIAGNOSIS — K649 Unspecified hemorrhoids: Secondary | ICD-10-CM | POA: Diagnosis not present

## 2012-11-17 NOTE — Telephone Encounter (Signed)
Left message for patient to call back  

## 2012-11-17 NOTE — Telephone Encounter (Signed)
OV with extender or MD of the day sometime this week please

## 2012-11-18 NOTE — Telephone Encounter (Signed)
I called pt and she says the she has "taken" care of the problem and asked that her appt for 6/17 with Dr Ardis Hughs be cx.  Appt was cx.

## 2012-11-26 ENCOUNTER — Ambulatory Visit (INDEPENDENT_AMBULATORY_CARE_PROVIDER_SITE_OTHER): Payer: Medicare Other | Admitting: Surgery

## 2012-11-26 ENCOUNTER — Encounter (INDEPENDENT_AMBULATORY_CARE_PROVIDER_SITE_OTHER): Payer: Self-pay | Admitting: Surgery

## 2012-11-26 VITALS — BP 110/70 | HR 68 | Temp 97.0°F | Resp 14 | Ht 66.5 in | Wt 136.0 lb

## 2012-11-26 DIAGNOSIS — K645 Perianal venous thrombosis: Secondary | ICD-10-CM | POA: Diagnosis not present

## 2012-11-26 NOTE — Progress Notes (Signed)
Patient ID: Deborah Shields, female   DOB: Feb 07, 1941, 72 y.o.   MRN: [id number]  Chief Complaint  Patient presents with  . New Evaluation    eval hems    HPI Deborah Shields is a 72 y.o. female.  Referred by Dr. Unice Cobble for evaluation of hemorrhoid HPI This is a 72 year old female good health who presents with recent onset of a single hemorrhoid. She has never had problems with hemorrhoids before. She has regular bowel movements with no sign of constipation. She developed a protruding mass which initially was fairly large and tender but has gotten much smaller. She was evaluated by the physician's assistant with [facility].  She was told that she had a thrombosed internal hemorrhoid. She now presents for surgical evaluation.   Past Medical History  Diagnosis Date  . Diverticulosis of colon   . Hypothyroidism     Dr Wilson Singer  . Hyperlipidemia     NMR 07/2009: LDL 200 (2260/1233)TG 99, HDL 65. LDL goal =<120, ideally <100. father MI @ 80  . Hypertension   . Microscopic hematuria   . Degenerative joint disease   . Peripheral vascular disease     ICA bilat, Dr.Charles fields, [facility]  . Carotid artery occlusion   . Heart murmur   . Anxiety   . Basal cell carcinoma 2007    dr houston/left  . Peripheral neuropathy     compressive in UE bilaterally; Dr Daylene Katayama    Past Surgical History  Procedure Laterality Date  . Abdominal hysterectomy  1973    age 32 due to dysfuctional menses; HRT x 25-30 years  . Bilateral oophorectomy  1990    prophylactically (sister had ovarian ca)  . Appendectomy  1952  . Cholecystectomy  2006  . Tonsillectomy  1957  . Orthopedic surgery  1989/200/2002/2012    elbows,shoulder surgery x 3, right hand  . Carpal tunnel release  1989    right  . Toe nail removed    . Basal cell cancer  03/2006    leg  . Cataract extraction, bilateral  2010    Dr Kathrin Penner    Family History  Problem Relation Age of Onset  . Heart attack Father 38  . Colon cancer  Mother 50  . Stroke Mother 74  . Ovarian cancer Sister   . Other Sister     Valve replacement (aortic & mitral ) in 2 sisters  . Heart disease Sister   . Kidney disease Mother     ? renal calculi; S/P resecton of kidney  . Heart disease Brother     aortic & mitral valve replacement in 2 bro; both had CBAG  . Diabetes Neg Hx     Social History History  Substance Use Topics  . Smoking status: Never Smoker   . Smokeless tobacco: Never Used  . Alcohol Use: No    Allergies  Allergen Reactions  . Penicillins     REACTION: itching and edema  . Aspirin     REACTION: GI upset  . Bupropion Hcl     REACTION: tinnitis  . Codeine     REACTION: GI upset  . Ezetimibe     REACTION: GI symptoms  . Lovastatin     REACTION: nausea  . Paroxetine   . Pravastatin Sodium     REACTION: elevated CPK    Current Outpatient Prescriptions  Medication Sig Dispense Refill  . amLODipine (NORVASC) 5 MG tablet Take 1 tablet (5 mg total) by mouth daily.  90 tablet  3  . aspirin 81 MG tablet Take 81 mg by mouth daily.        Marland Kitchen b complex vitamins tablet Take 1 tablet by mouth daily.        . Calcium Carbonate-Vitamin D (CALCIUM-VITAMIN D) 600-200 MG-UNIT CAPS Take by mouth.        . celecoxib (CELEBREX) 200 MG capsule 1-2 tabs daily.       . Cholecalciferol (VITAMIN D) 1000 UNITS capsule Take 1,000 Units by mouth daily.        . cycloSPORINE (RESTASIS) 0.05 % ophthalmic emulsion 1 drop 2 (two) times daily.        . hydrocortisone (PROCTOZONE-HC) 2.5 % rectal cream Place rectally 2 (two) times daily.  30 g  1  . levothyroxine (SYNTHROID) 88 MCG tablet Take 88 mcg by mouth daily. Taking the brand "synthroid"       . LORazepam (ATIVAN) 0.5 MG tablet Take 1 tablet (0.5 mg total) by mouth 3 (three) times daily as needed.  90 tablet  0  . losartan (COZAAR) 100 MG tablet Take 1 tablet (100 mg total) by mouth daily.  90 tablet  3  . meclizine (ANTIVERT) 25 MG tablet Take 1 tablet (25 mg total) by mouth  daily. 1/2 every 8 hrs prn for imbalance  30 tablet  2  . metoprolol (LOPRESSOR) 50 MG tablet 1/2 by mouth two times daily  90 tablet  3  . Multiple Vitamins-Minerals (CENTRUM SILVER PO) Take by mouth.        . traMADol (ULTRAM) 50 MG tablet 1/2-1 by mouth once daily as needed.  90 tablet  0  . vitamin E 100 UNIT capsule Take 100 Units by mouth daily.        No current facility-administered medications for this visit.    Review of Systems Review of Systems  Constitutional: Negative for fever, chills and unexpected weight change.  HENT: Negative for hearing loss, congestion, sore throat, trouble swallowing and voice change.   Eyes: Negative for visual disturbance.  Respiratory: Negative for cough and wheezing.   Cardiovascular: Negative for chest pain, palpitations and leg swelling.  Gastrointestinal: Positive for rectal pain. Negative for nausea, vomiting, abdominal pain, diarrhea, constipation, blood in stool, abdominal distention and anal bleeding.  Genitourinary: Negative for hematuria, vaginal bleeding and difficulty urinating.  Musculoskeletal: Negative for arthralgias.  Skin: Negative for rash and wound.  Neurological: Negative for seizures, syncope and headaches.  Hematological: Negative for adenopathy. Does not bruise/bleed easily.  Psychiatric/Behavioral: Negative for confusion.    Blood pressure 110/70, pulse 68, temperature 97 F (36.1 C), temperature source Temporal, resp. rate 14, height 5' 6.5" (1.689 m), weight 136 lb (61.689 kg).  Physical Exam Physical Exam WDWN in NAD Rectal - small thrombosed external hemorrhoid - right side; no inflammation; non-tender Data Reviewed none  Assessment    Resolving thrombosed external hemorrhoid     Plan    Tucks pads; avoid constipation Recheck 1 month.        Alois Mincer K. 11/26/2012, 1:20 PM

## 2012-11-26 NOTE — Patient Instructions (Signed)
Use tucks pads with bowel movements each day

## 2012-12-01 DIAGNOSIS — L819 Disorder of pigmentation, unspecified: Secondary | ICD-10-CM | POA: Diagnosis not present

## 2012-12-01 DIAGNOSIS — D692 Other nonthrombocytopenic purpura: Secondary | ICD-10-CM | POA: Diagnosis not present

## 2012-12-01 DIAGNOSIS — D239 Other benign neoplasm of skin, unspecified: Secondary | ICD-10-CM | POA: Diagnosis not present

## 2012-12-01 DIAGNOSIS — L821 Other seborrheic keratosis: Secondary | ICD-10-CM | POA: Diagnosis not present

## 2012-12-01 DIAGNOSIS — Z85828 Personal history of other malignant neoplasm of skin: Secondary | ICD-10-CM | POA: Diagnosis not present

## 2012-12-01 DIAGNOSIS — D1801 Hemangioma of skin and subcutaneous tissue: Secondary | ICD-10-CM | POA: Diagnosis not present

## 2012-12-08 ENCOUNTER — Ambulatory Visit: Payer: Medicare Other | Admitting: Gastroenterology

## 2012-12-12 ENCOUNTER — Encounter: Payer: Self-pay | Admitting: *Deleted

## 2012-12-14 ENCOUNTER — Encounter: Payer: Self-pay | Admitting: Cardiovascular Disease

## 2012-12-16 ENCOUNTER — Encounter: Payer: Self-pay | Admitting: Cardiovascular Disease

## 2012-12-16 ENCOUNTER — Ambulatory Visit (INDEPENDENT_AMBULATORY_CARE_PROVIDER_SITE_OTHER): Payer: Medicare Other | Admitting: Cardiovascular Disease

## 2012-12-16 VITALS — BP 104/80 | HR 60 | Resp 20 | Ht 66.5 in | Wt 133.7 lb

## 2012-12-16 DIAGNOSIS — I4891 Unspecified atrial fibrillation: Secondary | ICD-10-CM | POA: Diagnosis not present

## 2012-12-16 DIAGNOSIS — I480 Paroxysmal atrial fibrillation: Secondary | ICD-10-CM

## 2012-12-16 DIAGNOSIS — E782 Mixed hyperlipidemia: Secondary | ICD-10-CM | POA: Diagnosis not present

## 2012-12-16 DIAGNOSIS — I70219 Atherosclerosis of native arteries of extremities with intermittent claudication, unspecified extremity: Secondary | ICD-10-CM | POA: Diagnosis not present

## 2012-12-16 NOTE — Patient Instructions (Addendum)
Your physician recommends that you schedule a follow-up appointment in: one year  

## 2012-12-28 ENCOUNTER — Encounter (INDEPENDENT_AMBULATORY_CARE_PROVIDER_SITE_OTHER): Payer: Medicare Other | Admitting: Surgery

## 2013-01-04 ENCOUNTER — Ambulatory Visit (INDEPENDENT_AMBULATORY_CARE_PROVIDER_SITE_OTHER): Payer: Medicare Other | Admitting: Surgery

## 2013-01-04 ENCOUNTER — Encounter (INDEPENDENT_AMBULATORY_CARE_PROVIDER_SITE_OTHER): Payer: Self-pay | Admitting: Surgery

## 2013-01-04 VITALS — BP 126/84 | HR 65 | Temp 97.0°F | Resp 14 | Ht 66.5 in | Wt 132.4 lb

## 2013-01-04 DIAGNOSIS — K645 Perianal venous thrombosis: Secondary | ICD-10-CM

## 2013-01-04 NOTE — Progress Notes (Signed)
Followup of her thrombocytosis her hemorrhoid. The patient has been avoiding constipation and has been using Tucks pads. The external hemorrhoid has completely resolved. She has just a tiny bit of loose skin but no sign of tenderness or inflammation. She may resume selectivity and there is no need for further treatment. Followup as needed.  Imogene Burn. Georgette Dover, MD, [facility]  General/ Trauma Surgery  01/04/2013 12:14 PM

## 2013-01-10 ENCOUNTER — Encounter: Payer: Self-pay | Admitting: Cardiovascular Disease

## 2013-01-10 DIAGNOSIS — I480 Paroxysmal atrial fibrillation: Secondary | ICD-10-CM | POA: Insufficient documentation

## 2013-01-10 NOTE — Assessment & Plan Note (Signed)
Statin intolerance. She has tried pravastatin lovastatin simvastatin and was also unable to take Zetia.

## 2013-01-10 NOTE — Assessment & Plan Note (Signed)
Bilateral moderate carotid obstruction, right greater than left, without history of stroke or TIA. Serial carotid duplex studies performed by Dr. [name].

## 2013-01-10 NOTE — Assessment & Plan Note (Signed)
As far as we can tell she only had one episode of atrial fibrillation in a situation of hyperadrenergic state, but has not had other clinical episodes of atrial fibrillation. Her risk of embolic events appears to be moderate. She is approaching the age of 21, has hypertension and established vascular disease. If he has any recurrence of atrial fibrillation she should definitely be on full anticoagulation with warfarin or equivalent drug. At one point she took Xarelto, but she expressed a preference for aspirin therapy alone. Is reasonable as long as there is no recurrence of atrial fibrillation. The threshold for restarting anticoagulants in the case of AF recurrence is very low.

## 2013-01-10 NOTE — Progress Notes (Signed)
Patient ID: Deborah Shields, female   DOB: 04/23/1941, 72 y.o.   MRN: [id number]     Reason for office visit Atrial fibrillation  Mrs. Nash Mantis has done well since her last appointment one year ago. She has a history of atrial fibrillation that was initially detected in the postoperative period when she had shoulder surgery. She does not have any clinically evident episodes of atrial fibrillation. She denies palpitations, shortness of breath, chest pain, lower showed edema, syncope, focal neurological deficits, other potential signs of embolic events and bleeding problems.  She is also known to have moderate stenosis of the right internal carotid artery followed by Dr. [name]. She has treated hypertension, hypercholesterolemia, hypothyroidism. She does not have meaningful structural cardiac problems other than mild aortic insufficiency secondary to degenerative changes. Both H. are normal in size and shows normal left ventricular systolic function.    Allergies  Allergen Reactions  . Penicillins     REACTION: itching and edema  . Aspirin     REACTION: GI upset  . Benazepril Hcl   . Bupropion Hcl     REACTION: tinnitis  . Codeine     REACTION: GI upset  . Ezetimibe     REACTION: GI symptoms  . Fenofibrate     Myalgia   . Lovastatin     REACTION: nausea  . Paroxetine   . Pravastatin Sodium     REACTION: elevated CPK    Current Outpatient Prescriptions  Medication Sig Dispense Refill  . amLODipine (NORVASC) 5 MG tablet Take 1 tablet (5 mg total) by mouth daily.  90 tablet  3  . aspirin 81 MG tablet Take 81 mg by mouth daily.        Marland Kitchen b complex vitamins tablet Take 1 tablet by mouth daily.        . Calcium Carbonate-Vitamin D (CALCIUM-VITAMIN D) 600-200 MG-UNIT CAPS Take by mouth.        . celecoxib (CELEBREX) 200 MG capsule 1-2 tabs daily.       . Cholecalciferol (VITAMIN D) 1000 UNITS capsule Take 1,000 Units by mouth daily.        . clobetasol cream (TEMOVATE) 0.05 %       .  cyclobenzaprine (FLEXERIL) 10 MG tablet Take 10 mg by mouth 3 (three) times daily as needed for muscle spasms.      . cycloSPORINE (RESTASIS) 0.05 % ophthalmic emulsion 1 drop 2 (two) times daily.        . hydrocortisone (PROCTOZONE-HC) 2.5 % rectal cream Place rectally 2 (two) times daily.  30 g  1  . levothyroxine (SYNTHROID) 88 MCG tablet Take 88 mcg by mouth daily. Taking the brand "synthroid"       . LORazepam (ATIVAN) 0.5 MG tablet Take 1 tablet (0.5 mg total) by mouth 3 (three) times daily as needed.  90 tablet  0  . losartan (COZAAR) 100 MG tablet Take 1 tablet (100 mg total) by mouth daily.  90 tablet  3  . meclizine (ANTIVERT) 25 MG tablet Take 1 tablet (25 mg total) by mouth daily. 1/2 every 8 hrs prn for imbalance  30 tablet  2  . metoprolol (LOPRESSOR) 50 MG tablet 1/2 by mouth two times daily  90 tablet  3  . Multiple Vitamins-Minerals (CENTRUM SILVER PO) Take by mouth.        . traMADol (ULTRAM) 50 MG tablet 1/2-1 by mouth once daily as needed.  90 tablet  0  . vitamin E 100 UNIT  capsule Take 100 Units by mouth daily.        No current facility-administered medications for this visit.    Past Medical History  Diagnosis Date  . Diverticulosis of colon   . Hypothyroidism     Dr Wilson Singer  . Hyperlipidemia     NMR 07/2009: LDL 200 (2260/1233)TG 99, HDL 65. LDL goal =<120, ideally <100. father MI @ 49  . Hypertension   . Microscopic hematuria   . Degenerative joint disease   . Peripheral vascular disease     ICA bilat, Dr.Charles fields, [facility]  . Carotid artery occlusion   . Heart murmur   . Anxiety   . Basal cell carcinoma 2007    dr houston/left  . Peripheral neuropathy     compressive in UE bilaterally; Dr Daylene Katayama  . Aortic insufficiency     mild due to degenerative changes    Past Surgical History  Procedure Laterality Date  . Abdominal hysterectomy  1973    age 48 due to dysfuctional menses; HRT x 25-30 years  . Bilateral oophorectomy  1990    prophylactically  (sister had ovarian ca)  . Appendectomy  1952  . Cholecystectomy  2006  . Tonsillectomy  1957  . Orthopedic surgery  1989/200/2002/2012    elbows,shoulder surgery x 3, right hand  . Carpal tunnel release  1989    right  . Toe nail removed    . Basal cell cancer  03/2006    leg  . Cataract extraction, bilateral  2010    Dr Kathrin Penner  . US echocardiography  05/01/2010    trace MR,AI,TR;EF =>55%  . Nm myoview ltd  06/13/2008    low risk scan    Family History  Problem Relation Age of Onset  . Heart attack Father 57  . Colon cancer Mother 28  . Stroke Mother 33  . Ovarian cancer Sister   . Other Sister     Valve replacement (aortic & mitral ) in 2 sisters  . Heart disease Sister   . Kidney disease Mother     ? renal calculi; S/P resecton of kidney  . Heart disease Brother     aortic & mitral valve replacement in 2 bro; both had CBAG  . Diabetes Neg Hx     History   Social History  . Marital Status: Widowed    Spouse Name: N/A    Number of Children: N/A  . Years of Education: N/A   Occupational History  . Not on file.   Social History Main Topics  . Smoking status: Never Smoker   . Smokeless tobacco: Never Used  . Alcohol Use: No  . Drug Use: No  . Sexually Active: Not on file   Other Topics Concern  . Not on file   Social History Narrative   Low cholesterol diet   Regular exercise- yes     Review of systems: The patient specifically denies any chest pain at rest or with exertion, dyspnea at rest or with exertion, orthopnea, paroxysmal nocturnal dyspnea, syncope, palpitations, focal neurological deficits, intermittent claudication, lower extremity edema, unexplained weight gain, cough, hemoptysis or wheezing.  The patient also denies abdominal pain, nausea, vomiting, dysphagia, diarrhea, constipation, polyuria, polydipsia, dysuria, hematuria, frequency, urgency, abnormal bleeding or bruising, fever, chills, unexpected weight changes, mood swings, change in  skin or hair texture, change in voice quality, auditory or visual problems, allergic reactions or rashes, new musculoskeletal complaints other than usual "aches and pains".   PHYSICAL EXAM BP  104/80  Pulse 60  Resp 20  Ht 5' 6.5" (1.689 m)  Wt 133 lb 11.2 oz (60.646 kg)  BMI 21.26 kg/m2  General: Alert, oriented x3, no distress Head: no evidence of trauma, PERRL, EOMI, no exophtalmos or lid lag, no myxedema, no xanthelasma; normal ears, nose and oropharynx Neck: normal jugular venous pulsations and no hepatojugular reflux; brisk carotid pulses without delay and no carotid bruits Chest: clear to auscultation, no signs of consolidation by percussion or palpation, normal fremitus, symmetrical and full respiratory excursions Cardiovascular: normal position and quality of the apical impulse, regular rhythm, normal first and second heart sounds, no murmurs, rubs or gallops Abdomen: no tenderness or distention, no masses by palpation, no abnormal pulsatility or arterial bruits, normal bowel sounds, no hepatosplenomegaly Extremities: no clubbing, cyanosis or edema; 2+ radial, ulnar and brachial pulses bilaterally; 2+ right femoral, posterior tibial and dorsalis pedis pulses; 2+ left femoral, posterior tibial and dorsalis pedis pulses; no subclavian or femoral bruits Neurological: grossly nonfocal   EKG: Normal sinus rhythm  Lipid Panel     Component Value Date/Time   CHOL 253* 01/16/2011 0854   TRIG 142.0 01/16/2011 0854   HDL 56.60 01/16/2011 0854   CHOLHDL 4 01/16/2011 0854   VLDL 28.4 01/16/2011 0854   LDLCALC 108* 04/03/2010 0750    BMET    Component Value Date/Time   NA 138 08/07/2010 1126   K 4.6 08/07/2010 1126   CL 103 08/07/2010 1126   CO2 27 08/07/2010 1126   GLUCOSE 102* 08/07/2010 1126   BUN 16 08/07/2010 1126   CREATININE 1.35* 08/07/2010 1126   CALCIUM 9.5 08/07/2010 1126   GFRNONAA 39* 08/07/2010 1126   GFRAA  Value: 47        The eGFR has been calculated using the MDRD  equation. This calculation has not been validated in all clinical situations. eGFR's persistently <60 mL/min signify possible Chronic Kidney Disease.* 08/07/2010 1126     ASSESSMENT AND PLAN PAF (paroxysmal atrial fibrillation) As far as we can tell she only had one episode of atrial fibrillation in a situation of hyperadrenergic state, but has not had other clinical episodes of atrial fibrillation. Her risk of embolic events appears to be moderate. She is approaching the age of 61, has hypertension and established vascular disease. If he has any recurrence of atrial fibrillation she should definitely be on full anticoagulation with warfarin or equivalent drug. At one point she took Xarelto, but she expressed a preference for aspirin therapy alone. Is reasonable as long as there is no recurrence of atrial fibrillation. The threshold for restarting anticoagulants in the case of AF recurrence is very low.  HYPERLIPIDEMIA Statin intolerance. She has tried pravastatin lovastatin simvastatin and was also unable to take Zetia.   Atherosclerosis of native arteries of the extremities with intermittent claudication Bilateral moderate carotid obstruction, right greater than left, without history of stroke or TIA. Serial carotid duplex studies performed by Dr. [name].   Orders Placed This Encounter  Procedures  . EKG 12-Lead   Meds ordered this encounter  Medications  . clobetasol cream (TEMOVATE) 0.05 %    Sig:     Toua Stites  Sanda Klein, MD, [facility] [phone] office [phone] pager

## 2013-02-03 DIAGNOSIS — M47817 Spondylosis without myelopathy or radiculopathy, lumbosacral region: Secondary | ICD-10-CM | POA: Diagnosis not present

## 2013-02-03 DIAGNOSIS — M545 Low back pain, unspecified: Secondary | ICD-10-CM | POA: Diagnosis not present

## 2013-02-03 DIAGNOSIS — M19049 Primary osteoarthritis, unspecified hand: Secondary | ICD-10-CM | POA: Diagnosis not present

## 2013-02-03 DIAGNOSIS — M2550 Pain in unspecified joint: Secondary | ICD-10-CM | POA: Diagnosis not present

## 2013-02-03 DIAGNOSIS — M1990 Unspecified osteoarthritis, unspecified site: Secondary | ICD-10-CM | POA: Diagnosis not present

## 2013-02-08 DIAGNOSIS — M47812 Spondylosis without myelopathy or radiculopathy, cervical region: Secondary | ICD-10-CM | POA: Diagnosis not present

## 2013-02-08 DIAGNOSIS — M75120 Complete rotator cuff tear or rupture of unspecified shoulder, not specified as traumatic: Secondary | ICD-10-CM | POA: Diagnosis not present

## 2013-02-16 DIAGNOSIS — M47812 Spondylosis without myelopathy or radiculopathy, cervical region: Secondary | ICD-10-CM | POA: Diagnosis not present

## 2013-02-23 DIAGNOSIS — M47812 Spondylosis without myelopathy or radiculopathy, cervical region: Secondary | ICD-10-CM | POA: Diagnosis not present

## 2013-02-23 DIAGNOSIS — L570 Actinic keratosis: Secondary | ICD-10-CM | POA: Diagnosis not present

## 2013-02-23 DIAGNOSIS — D239 Other benign neoplasm of skin, unspecified: Secondary | ICD-10-CM | POA: Diagnosis not present

## 2013-02-23 DIAGNOSIS — L821 Other seborrheic keratosis: Secondary | ICD-10-CM | POA: Diagnosis not present

## 2013-02-23 DIAGNOSIS — Z85828 Personal history of other malignant neoplasm of skin: Secondary | ICD-10-CM | POA: Diagnosis not present

## 2013-03-02 DIAGNOSIS — M47812 Spondylosis without myelopathy or radiculopathy, cervical region: Secondary | ICD-10-CM | POA: Diagnosis not present

## 2013-03-04 DIAGNOSIS — Z23 Encounter for immunization: Secondary | ICD-10-CM | POA: Diagnosis not present

## 2013-03-08 DIAGNOSIS — M47812 Spondylosis without myelopathy or radiculopathy, cervical region: Secondary | ICD-10-CM | POA: Diagnosis not present

## 2013-03-16 ENCOUNTER — Other Ambulatory Visit: Payer: Self-pay

## 2013-03-16 DIAGNOSIS — M47812 Spondylosis without myelopathy or radiculopathy, cervical region: Secondary | ICD-10-CM | POA: Diagnosis not present

## 2013-03-16 DIAGNOSIS — Z1231 Encounter for screening mammogram for malignant neoplasm of breast: Secondary | ICD-10-CM

## 2013-03-23 DIAGNOSIS — M47812 Spondylosis without myelopathy or radiculopathy, cervical region: Secondary | ICD-10-CM | POA: Diagnosis not present

## 2013-04-05 ENCOUNTER — Other Ambulatory Visit: Payer: Self-pay | Admitting: Vascular Surgery

## 2013-04-05 DIAGNOSIS — M1990 Unspecified osteoarthritis, unspecified site: Secondary | ICD-10-CM | POA: Diagnosis not present

## 2013-04-05 DIAGNOSIS — I7789 Other specified disorders of arteries and arterioles: Secondary | ICD-10-CM

## 2013-04-05 DIAGNOSIS — M2550 Pain in unspecified joint: Secondary | ICD-10-CM | POA: Diagnosis not present

## 2013-04-05 DIAGNOSIS — M19049 Primary osteoarthritis, unspecified hand: Secondary | ICD-10-CM | POA: Diagnosis not present

## 2013-04-05 DIAGNOSIS — M24119 Other articular cartilage disorders, unspecified shoulder: Secondary | ICD-10-CM | POA: Diagnosis not present

## 2013-04-05 DIAGNOSIS — M545 Low back pain, unspecified: Secondary | ICD-10-CM | POA: Diagnosis not present

## 2013-04-05 DIAGNOSIS — M47812 Spondylosis without myelopathy or radiculopathy, cervical region: Secondary | ICD-10-CM | POA: Diagnosis not present

## 2013-04-05 DIAGNOSIS — M67919 Unspecified disorder of synovium and tendon, unspecified shoulder: Secondary | ICD-10-CM | POA: Diagnosis not present

## 2013-04-07 ENCOUNTER — Telehealth: Payer: Self-pay | Admitting: *Deleted

## 2013-04-07 DIAGNOSIS — I10 Essential (primary) hypertension: Secondary | ICD-10-CM

## 2013-04-07 MED ORDER — METOPROLOL TARTRATE 50 MG PO TABS: ORAL_TABLET | ORAL | Status: DC

## 2013-04-07 NOTE — Telephone Encounter (Signed)
Rx sent to the pharmacy by e-script.//AB/CMA 

## 2013-04-13 ENCOUNTER — Ambulatory Visit: Payer: Medicare Other | Admitting: Vascular Surgery

## 2013-04-13 ENCOUNTER — Other Ambulatory Visit: Payer: Medicare Other

## 2013-04-14 ENCOUNTER — Encounter: Payer: Self-pay | Admitting: Family

## 2013-04-15 ENCOUNTER — Other Ambulatory Visit: Payer: Medicare Other

## 2013-04-15 ENCOUNTER — Encounter: Payer: Self-pay | Admitting: Family

## 2013-04-15 ENCOUNTER — Ambulatory Visit (INDEPENDENT_AMBULATORY_CARE_PROVIDER_SITE_OTHER): Payer: Medicare Other | Admitting: Family

## 2013-04-15 ENCOUNTER — Ambulatory Visit: Payer: Medicare Other | Admitting: Vascular Surgery

## 2013-04-15 ENCOUNTER — Ambulatory Visit (HOSPITAL_COMMUNITY)
Admission: RE | Admit: 2013-04-15 | Discharge: 2013-04-15 | Disposition: A | Discharge status: Home / Self Care | Payer: Medicare Other | Source: Ambulatory Visit | Attending: Family | Admitting: Family

## 2013-04-15 ENCOUNTER — Encounter (INDEPENDENT_AMBULATORY_CARE_PROVIDER_SITE_OTHER): Payer: Self-pay

## 2013-04-15 DIAGNOSIS — I658 Occlusion and stenosis of other precerebral arteries: Secondary | ICD-10-CM | POA: Insufficient documentation

## 2013-04-15 DIAGNOSIS — I6529 Occlusion and stenosis of unspecified carotid artery: Secondary | ICD-10-CM | POA: Insufficient documentation

## 2013-04-15 DIAGNOSIS — I7789 Other specified disorders of arteries and arterioles: Secondary | ICD-10-CM | POA: Diagnosis not present

## 2013-04-15 NOTE — Progress Notes (Signed)
Established Carotid Patient  History of Present Illness  Deborah Shields is a 72 y.o. female patient of Dr. [name] who has known carotid stenosis. 6 months ago ago her carotid Duplex demonstrated moderate bilateral stenosis.She returns today for scheduled surveillance. Patient denies ever having a stroke of TIA symptoms, denies claudication, denies non-healing wounds.  The patient denies amaurosis fugax or monocular blindness.  The patient  denies facial drooping.  Pt. denies hemiplegia.  The patient denies receptive or expressive aphasia.  Pt. denies extremity weakness. Sees a cardiologist due to family hx of CAD and personal hx of murmur. Patient denies New Medical or Surgical History other than worsening OA.  Pt Diabetic: No Pt smoker: non-smoker  Pt meds include: Statin : No: all statins tried caused very elevated liver enzymes and muscle weakness/severe cramping Betablocker: Yes ASA: Yes Other anticoagulants/antiplatelets: no   Past Medical History  Diagnosis Date  . Diverticulosis of colon   . Hypothyroidism     Dr Wilson Singer  . Hyperlipidemia     NMR 07/2009: LDL 200 (2260/1233)TG 99, HDL 65. LDL goal =<120, ideally <100. father MI @ 58  . Hypertension   . Microscopic hematuria   . Degenerative joint disease   . Peripheral vascular disease     ICA bilat, Dr.Charles fields, [facility]  . Carotid artery occlusion   . Heart murmur   . Anxiety   . Basal cell carcinoma 2007    dr [place]/left  . Peripheral neuropathy     compressive in UE bilaterally; Dr Daylene Katayama  . Aortic insufficiency     mild due to degenerative changes    Social History History  Substance Use Topics  . Smoking status: Never Smoker   . Smokeless tobacco: Never Used  . Alcohol Use: No    Family History Family History  Problem Relation Age of Onset  . Heart attack Father 38  . Colon cancer Mother 12  . Stroke Mother 59  . Ovarian cancer Sister   . Other Sister     Valve replacement (aortic & mitral )  in 2 sisters  . Heart disease Sister   . Kidney disease Mother     ? renal calculi; S/P resecton of kidney  . Heart disease Brother     aortic & mitral valve replacement in 2 bro; both had CBAG  . Diabetes Neg Hx     Surgical History Past Surgical History  Procedure Laterality Date  . Abdominal hysterectomy  1973    age 89 due to dysfuctional menses; HRT x 25-30 years  . Bilateral oophorectomy  1990    prophylactically (sister had ovarian ca)  . Appendectomy  1952  . Cholecystectomy  2006  . Tonsillectomy  1957  . Orthopedic surgery  1989/200/2002/2012    elbows,shoulder surgery x 3, right hand  . Carpal tunnel release  1989    right  . Toe nail removed    . Basal cell cancer  03/2006    leg  . Cataract extraction, bilateral  2010    Dr Kathrin Penner  . US echocardiography  05/01/2010    trace MR,AI,TR;EF =>55%  . Nm myoview ltd  06/13/2008    low risk scan  . Eye surgery      Allergies  Allergen Reactions  . Penicillins     REACTION: itching and edema  . Aspirin     REACTION: GI upset  . Benazepril Hcl   . Bupropion Hcl     REACTION: tinnitis  . Codeine  REACTION: GI upset  . Ezetimibe     REACTION: GI symptoms  . Fenofibrate     Myalgia   . Lovastatin     REACTION: nausea  . Paroxetine   . Pravastatin Sodium     REACTION: elevated CPK    Current Outpatient Prescriptions  Medication Sig Dispense Refill  . amLODipine (NORVASC) 5 MG tablet Take 1 tablet (5 mg total) by mouth daily.  90 tablet  3  . aspirin 81 MG tablet Take 81 mg by mouth daily.        Marland Kitchen b complex vitamins tablet Take 1 tablet by mouth daily.        . Calcium Carbonate-Vitamin D (CALCIUM-VITAMIN D) 600-200 MG-UNIT CAPS Take by mouth.        . Cholecalciferol (VITAMIN D) 1000 UNITS capsule Take 1,000 Units by mouth daily.        . clobetasol cream (TEMOVATE) 0.05 %       . cyclobenzaprine (FLEXERIL) 10 MG tablet Take 10 mg by mouth 3 (three) times daily as needed for muscle spasms.       . cycloSPORINE (RESTASIS) 0.05 % ophthalmic emulsion 1 drop 2 (two) times daily.        . hydrocortisone (PROCTOZONE-HC) 2.5 % rectal cream Place rectally 2 (two) times daily.  30 g  1  . hydroxychloroquine (PLAQUENIL) 200 MG tablet 2 (two) times daily. Arthritist      . levothyroxine (SYNTHROID) 88 MCG tablet Take 88 mcg by mouth daily. Taking the brand "synthroid"       . LORazepam (ATIVAN) 0.5 MG tablet Take 1 tablet (0.5 mg total) by mouth 3 (three) times daily as needed.  90 tablet  0  . losartan (COZAAR) 100 MG tablet Take 1 tablet (100 mg total) by mouth daily.  90 tablet  3  . meclizine (ANTIVERT) 25 MG tablet Take 1 tablet (25 mg total) by mouth daily. 1/2 every 8 hrs prn for imbalance  30 tablet  2  . metoprolol (LOPRESSOR) 50 MG tablet 1/2 by mouth two times daily  90 tablet  1  . Multiple Vitamins-Minerals (CENTRUM SILVER PO) Take by mouth.        . traMADol (ULTRAM) 50 MG tablet 1/2-1 by mouth once daily as needed.  90 tablet  0  . vitamin E 100 UNIT capsule Take 100 Units by mouth daily.       . celecoxib (CELEBREX) 200 MG capsule 1-2 tabs daily.        No current facility-administered medications for this visit.    Review of Systems : [x]  Positive   [ ]  Denies  General:[ ]  Weight loss,  [ ]  Weight gain, [ ]  Loss of appetite, [ ]  Fever, [ ]  chills  Neurologic: [ ]  Dizziness, [ ]  Blackouts, [ ]  Headaches, [ ]  Seizure [ ]  Stroke, [ ]  "Mini stroke", [ ]  Slurred speech, [ ]  Temporary blindness;  [ ] weakness,  Ear/Nose/Throat: [ ]  Change in hearing, [ ]  Nose bleeds, [ ]  Hoarseness  Vascular:[ ]  Pain in legs with walking, [ ]  Pain in feet while lying flat , [ ]   Non-healing ulcer, [ ]  Blood clot in vein,    Pulmonary: [ ]  Home oxygen, [ ]   Productive cough, [ ]  Bronchitis, [ ]  Coughing up blood,  [ ]  Asthma, [ ]  Wheezing  Musculoskeletal:  [ ]  Arthritis, [ ]  Joint pain, [ ]  low back pain  Cardiac: [ ]  Chest pain, [ ]  Shortness of  breath when lying flat, [ ]  Shortness of  breath with exertion, [ ]  Palpitations, [ ]  Heart murmur, [ ]   Atrial fibrillation  Hematologic:[ ]  Easy Bruising, [ ]  Anemia; [ ]  Hepatitis  Psychiatric: [ ]   Depression, [ ]  Anxiety   Gastrointestinal: [ ]  Black stool, [ ]  Blood in stool, [ ]  Peptic ulcer disease,  [ ]  Gastroesophageal Reflux, [ ]  Trouble swallowing, [ ]  Diarrhea, [ ]  Constipation  Urinary: [ ]  chronic Kidney disease, [ ]  on HD, [ ]  Burning with urination, [ ]  Frequent urination, [ ]  Difficulty urinating;   Skin: [ ]  Rashes, [ ]  Wounds    Physical Examination  Filed Vitals:   04/15/13 1611  BP: 123/82  Pulse: 60  Resp:    Filed Weights   04/15/13 1608  Weight: 131 lb (59.421 kg)   Body mass index is 20.83 kg/(m^2).   General: WDWN female in NAD GAIT: normal Eyes: PERRLA Pulmonary:  CTAB, Negative  Rales, Negative rhonchi, & Negative wheezing.  Cardiac: regular Rhythm ,  No Murmurs detected.  VASCULAR EXAM Carotid Bruits Left Right   Negative Negative    Aorta is not palpable. Radial pulses are 2+ palpable and  equal.                                                                                                                            LE Pulses LEFT RIGHT       FEMORAL   palpable   palpable        POPLITEAL  not palpable   not palpable       POSTERIOR TIBIAL   palpable    palpable        DORSALIS PEDIS      ANTERIOR TIBIAL  palpable   palpable     Gastrointestinal: soft, nontender, BS WNL, no r/g,  negative masses.  Musculoskeletal: Negative muscle atrophy/wasting. M/S 5/5 throughout, Extremities without ischemic changes.  Neurologic: A&O X 3; Appropriate Affect ; SENSATION ;normal;  Speech is normal CN 2-12 intact, Pain and light touch intact in extremities, Motor exam as listed above.   Non-Invasive Vascular Imaging CAROTID DUPLEX 04/15/2013   Right ICA: 60 - 79 % stenosis. Elevated velocities in the mid-distal ICA appear to be a kink in the vessel, no plaque noted.. Left  ICA: 60 - 79 % stenosis. Elevated velocities in the distal ICA are consistent with fibromuscular dysplasia, no plaque observed.   These findings are Unchanged from previous exam.  Assessment: Deborah Shields is a 72 y.o. female who presents with asymptomatic bilateral ICA stenosis at 60-79%, stable over several years No plaque is noted, vessel kinking in right ICA and possible fibromuscular dysplasia in left ICA.  The  ICA stenosis is  Unchanged from previous exam.  Plan: Based on today's exam and vascular lab study, and after discussion with Dr. [name], patient advised to follow-up in 6 months with Carotid Duplex scan.   I  discussed in depth with the patient the nature of atherosclerosis, and emphasized the importance of maximal medical management including strict control of blood pressure, blood glucose, and lipid levels, obtaining regular exercise, and continued cessation of smoking.  The patient is aware that without maximal medical management the underlying atherosclerotic disease process will progress, limiting the benefit of any interventions. The patient was given information about stroke prevention and what symptoms should prompt the patient to seek immediate medical care. Thank you for allowing [place] to participate in this patient's care.  Clemon Chambers, RN, MSN, FNP-C [facility] Office: [facility] Physician: [name]  04/15/2013 4:31 PM

## 2013-04-15 NOTE — Patient Instructions (Signed)
Stroke Prevention Some medical conditions and behaviors are associated with an increased chance of having a stroke. You may prevent a stroke by making healthy choices and managing medical conditions. Reduce your risk of having a stroke by:  Staying physically active. Get at least 30 minutes of activity on most or all days.  Not smoking. It may also be helpful to avoid exposure to secondhand smoke.  Limiting alcohol use. Moderate alcohol use is considered to be:  No more than 2 drinks per day for men.  No more than 1 drink per day for nonpregnant women.  Eating healthy foods.  Include 5 or more servings of fruits and vegetables a day.  Certain diets may be prescribed to address high blood pressure, high cholesterol, diabetes, or obesity.  Managing your cholesterol levels.  A low-saturated fat, low-trans fat, low-cholesterol, and high-fiber diet may control cholesterol levels.  Take any prescribed medicines to control cholesterol as directed by your caregiver.  Managing your diabetes.  A controlled-carbohydrate, controlled-sugar diet is recommended to manage diabetes.  Take any prescribed medicines to control diabetes as directed by your caregiver.  Controlling your high blood pressure (hypertension).  A low-salt (sodium), low-saturated fat, low-trans fat, and low-cholesterol diet is recommended to manage high blood pressure.  Take any prescribed medicines to control hypertension as directed by your caregiver.  Maintaining a healthy weight.  A reduced-calorie, low-sodium, low-saturated fat, low-trans fat, low-cholesterol diet is recommended to manage weight.  Stopping drug abuse.  Avoiding birth control pills.  Talk to your caregiver about the risks of taking birth control pills if you are over 35 years old, smoke, get migraines, or have ever had a blood clot.  Getting evaluated for sleep disorders (sleep apnea).  Talk to your caregiver about getting a sleep evaluation  if you snore a lot or have excessive sleepiness.  Taking medicines as directed by your caregiver.  For some people, aspirin or blood thinners (anticoagulants) are helpful in reducing the risk of forming abnormal blood clots that can lead to stroke. If you have the irregular heart rhythm of atrial fibrillation, you should be on a blood thinner unless there is a good reason you cannot take them.  Understand all your medicine instructions. SEEK IMMEDIATE MEDICAL CARE IF:   You have sudden weakness or numbness of the face, arm, or leg, especially on one side of the body.  You have sudden confusion.  You have trouble speaking (aphasia) or understanding.  You have sudden trouble seeing in one or both eyes.  You have sudden trouble walking.  You have dizziness.  You have a loss of balance or coordination.  You have a sudden, severe headache with no known cause.  You have new chest pain or an irregular heartbeat. Any of these symptoms may represent a serious problem that is an emergency. Do not wait to see if the symptoms will go away. Get medical help right away. Call your local emergency services (911 in [place] Do not drive yourself to the hospital. Document Released: 07/18/2004 Document Revised: 09/02/2011 Document Reviewed: 01/28/2011 Exit[employer] Patient Information 2014 [employer].  

## 2013-04-16 DIAGNOSIS — M1710 Unilateral primary osteoarthritis, unspecified knee: Secondary | ICD-10-CM | POA: Diagnosis not present

## 2013-04-16 DIAGNOSIS — IMO0002 Reserved for concepts with insufficient information to code with codable children: Secondary | ICD-10-CM | POA: Diagnosis not present

## 2013-04-19 DIAGNOSIS — M1710 Unilateral primary osteoarthritis, unspecified knee: Secondary | ICD-10-CM | POA: Diagnosis not present

## 2013-04-21 ENCOUNTER — Other Ambulatory Visit: Payer: Self-pay | Admitting: *Deleted

## 2013-04-22 DIAGNOSIS — M2550 Pain in unspecified joint: Secondary | ICD-10-CM | POA: Diagnosis not present

## 2013-04-22 DIAGNOSIS — M545 Low back pain, unspecified: Secondary | ICD-10-CM | POA: Diagnosis not present

## 2013-04-22 DIAGNOSIS — R894 Abnormal immunological findings in specimens from other organs, systems and tissues: Secondary | ICD-10-CM | POA: Diagnosis not present

## 2013-04-22 DIAGNOSIS — M19049 Primary osteoarthritis, unspecified hand: Secondary | ICD-10-CM | POA: Diagnosis not present

## 2013-05-06 DIAGNOSIS — M1710 Unilateral primary osteoarthritis, unspecified knee: Secondary | ICD-10-CM | POA: Diagnosis not present

## 2013-05-17 DIAGNOSIS — N183 Chronic kidney disease, stage 3 unspecified: Secondary | ICD-10-CM | POA: Diagnosis not present

## 2013-05-17 DIAGNOSIS — E0789 Other specified disorders of thyroid: Secondary | ICD-10-CM | POA: Diagnosis not present

## 2013-05-24 DIAGNOSIS — M79609 Pain in unspecified limb: Secondary | ICD-10-CM | POA: Diagnosis not present

## 2013-05-24 DIAGNOSIS — E0789 Other specified disorders of thyroid: Secondary | ICD-10-CM | POA: Diagnosis not present

## 2013-06-04 ENCOUNTER — Ambulatory Visit
Admission: RE | Admit: 2013-06-04 | Discharge: 2013-06-04 | Disposition: A | Discharge status: Home / Self Care | Payer: Medicare Other | Source: Ambulatory Visit

## 2013-06-04 DIAGNOSIS — Z1231 Encounter for screening mammogram for malignant neoplasm of breast: Secondary | ICD-10-CM

## 2013-06-21 ENCOUNTER — Telehealth: Payer: Self-pay | Admitting: *Deleted

## 2013-06-21 ENCOUNTER — Other Ambulatory Visit: Payer: Self-pay | Admitting: *Deleted

## 2013-06-21 DIAGNOSIS — R05 Cough: Secondary | ICD-10-CM

## 2013-06-21 DIAGNOSIS — R059 Cough, unspecified: Secondary | ICD-10-CM

## 2013-06-21 NOTE — Telephone Encounter (Signed)
Spoke with pt and she is able to go to [facility] (CXR order put in) and then come to our office for a 12 appt. JG//CMA

## 2013-06-21 NOTE — Telephone Encounter (Signed)
Patient called and stated that she is very sick and feels like she may have pneumonia. I informed patient that we did not have an available appointment and that she could be seen at [facility] urgent care or [facility] but she only wants to see dr Deborah Shields. Patient wants dr hopper to be aware of this situation.

## 2013-06-21 NOTE — Telephone Encounter (Signed)
Will you please add her to [facility]'s schedule at 12 on 06/22/13? Thanks, Janett Billow

## 2013-06-21 NOTE — Telephone Encounter (Signed)
Xray @ [facility] 12/30 & OV @ 12 noon here

## 2013-06-21 NOTE — Telephone Encounter (Signed)
Please advise.//AB/CMA 

## 2013-06-22 ENCOUNTER — Encounter: Payer: Self-pay | Admitting: Internal Medicine

## 2013-06-22 ENCOUNTER — Ambulatory Visit (INDEPENDENT_AMBULATORY_CARE_PROVIDER_SITE_OTHER): Payer: Medicare Other | Admitting: Internal Medicine

## 2013-06-22 ENCOUNTER — Ambulatory Visit (INDEPENDENT_AMBULATORY_CARE_PROVIDER_SITE_OTHER)
Admission: RE | Admit: 2013-06-22 | Discharge: 2013-06-22 | Disposition: A | Discharge status: Home / Self Care | Payer: Medicare Other | Source: Ambulatory Visit | Attending: Internal Medicine | Admitting: Internal Medicine

## 2013-06-22 VITALS — BP 109/71 | HR 56 | Temp 97.7°F | Resp 16 | Wt 123.0 lb

## 2013-06-22 DIAGNOSIS — J209 Acute bronchitis, unspecified: Secondary | ICD-10-CM

## 2013-06-22 DIAGNOSIS — R509 Fever, unspecified: Secondary | ICD-10-CM | POA: Diagnosis not present

## 2013-06-22 DIAGNOSIS — R059 Cough, unspecified: Secondary | ICD-10-CM | POA: Diagnosis not present

## 2013-06-22 DIAGNOSIS — R05 Cough: Secondary | ICD-10-CM

## 2013-06-22 DIAGNOSIS — R34 Anuria and oliguria: Secondary | ICD-10-CM

## 2013-06-22 DIAGNOSIS — J40 Bronchitis, not specified as acute or chronic: Secondary | ICD-10-CM | POA: Diagnosis not present

## 2013-06-22 LAB — BASIC METABOLIC PANEL
BUN: 24 mg/dL — ABNORMAL HIGH (ref 6–23)
CO2: 28 mEq/L (ref 19–32)
Calcium: 10.2 mg/dL (ref 8.4–10.5)
Chloride: 101 mEq/L (ref 96–112)
Creatinine, Ser: 1.9 mg/dL — ABNORMAL HIGH (ref 0.4–1.2)
GFR: 27.25 mL/min — ABNORMAL LOW (ref >60.00)
Glucose, Bld: 99 mg/dL (ref 70–99)
Potassium: 3.7 mEq/L (ref 3.5–5.1)
Sodium: 137 mEq/L (ref 135–145)

## 2013-06-22 LAB — CBC WITH DIFFERENTIAL/PLATELET
Basophils Absolute: 0 10*3/uL (ref 0.0–0.1)
Basophils Relative: 0.2 % (ref 0.0–3.0)
Eosinophils Absolute: 0.2 10*3/uL (ref 0.0–0.7)
Eosinophils Relative: 2.7 % (ref 0.0–5.0)
HCT: 41.9 % (ref 36.0–46.0)
Hemoglobin: 13.8 g/dL (ref 12.0–15.0)
Lymphocytes Relative: 29.7 % (ref 12.0–46.0)
Lymphs Abs: 2.1 10*3/uL (ref 0.7–4.0)
MCHC: 33 g/dL (ref 30.0–36.0)
MCV: 92.1 fl (ref 78.0–100.0)
Monocytes Absolute: 0.6 10*3/uL (ref 0.1–1.0)
Monocytes Relative: 8.6 % (ref 3.0–12.0)
Neutro Abs: 4.2 10*3/uL (ref 1.4–7.7)
Neutrophils Relative %: 58.8 % (ref 43.0–77.0)
Platelets: 238 10*3/uL (ref 150.0–400.0)
RBC: 4.55 Mil/uL (ref 3.87–5.11)
RDW: 13.8 % (ref 11.5–14.6)
WBC: 7.1 10*3/uL (ref 4.5–10.5)

## 2013-06-22 MED ORDER — LEVOFLOXACIN 500 MG PO TABS: 500.0000 mg | ORAL_TABLET | Freq: Every day | ORAL | Status: DC

## 2013-06-22 MED ORDER — BENZONATATE 100 MG PO CAPS: 100.0000 mg | ORAL_CAPSULE | Freq: Three times a day (TID) | ORAL | Status: DC | PRN

## 2013-06-22 NOTE — Telephone Encounter (Signed)
Appointment scheduled.

## 2013-06-22 NOTE — Telephone Encounter (Signed)
Can you please schedule this patient? I do not have override capabilities. Thank you!

## 2013-06-22 NOTE — Patient Instructions (Signed)
Stop the Augmentin and start the generic Levaquin. Decreased the losartan by one half pill until you're well. Stay on clear liquids for 48 hours  until stronger.This would include  jello, sherbert (NOT ice cream), Lipton's chicken noodle soup(NOT cream based soups),Gatorade Lite, flat Ginger ale (without High Fructose Corn Syrup),dry toast or crackers, baked potato.No milk , dairy or grease until better.  Your next office appointment will be determined based upon review of your pending labs . Those instructions will be transmitted to you through My Chart . Followup as needed for your this acute issue. Please report any significant change in your symptoms.

## 2013-06-22 NOTE — Progress Notes (Signed)
   Subjective:    Patient ID: Deborah Shields, female    DOB: [birth year], 72 y.o.   MRN: [id number]  HPI  Symptoms began 06/17/13 as congestion of the head and chest which progressed. She received generic Augmentin by phone 06/19/13 from [facility].  The head symptoms have improved in that she has less discomfort in the frontal sinus area.  She continues to have a cough productive of green-brown sputum associated with shortness of breath and some wheezing.  She's had fever up to 102 with profuse diaphoresis. She has associated malaise and anorexia. Her urine output has decreased in volume.  She did take the flu shot this year.  Her chest x-ray performed this morning was reviewed. This does show hyperexpansion suggestive of some emphysematous change which be age-related. There is no acute cardiopulmonary process such as community-acquired pneumonia. Mild scarring is present which is stable  Review of Systems  At this time she denies significant frontal sinus or maxillary sinus pain. She also denies nasal purulence. She has no dental pain or sore throat at this time. She also has no dental pain or otic discharge.  She describes diffuse soreness of the entire spine without effusion myalgias or arthralgias.  She is not having dysuria, pyuria, or hematuria.       Objective:   Physical Exam General appearance:appears weak but adequately nourished;in  no acute distress or increased work of breathing is present.  No  lymphadenopathy about the head, neck, or axilla noted.   Eyes: No conjunctival inflammation or lid edema is present. There is no scleral icterus.  Ears:  External ear exam shows no significant lesions or deformities.  Otoscopic examination reveals clear canals, tympanic membranes are intact bilaterally without bulging, retraction, inflammation or discharge.  Nose:  External nasal examination shows no deformity or inflammation. Nasal mucosa are pink and moist without  lesions or exudates. No septal dislocation or deviation.No obstruction to airflow.   Oral exam: Upper denture; lips and gums are healthy appearing.There is no oropharyngeal erythema or exudate noted.   Neck:  No deformities,  masses, or tenderness noted.   Supple with full range of motion without pain.   Heart:  Normal rate and regular rhythm. S1 and S2 normal without gallop, murmur, click, rub or other extra sounds.   Lungs: Diffuse low-grade rales some bronchovesicular breath sounds suggested without wheezing, harsh rhonchi, or rubs.No increased work of breathing.    Extremities:  No cyanosis, edema, or clubbing  noted    Skin: Warm & dry w/o jaundice or tenting.         Assessment & Plan:  #1 bronchitis with purulent sputum ; no pneumonia on chest x-ray  #2 significant fever  #3 decreased urine output  #4 relative hypotension  Plan: Blood count and chemistries will be collected. Her angiotensin receptor blocker will be decreased to one half dose. The antibiotic will be changed to generic Levaquin.

## 2013-06-23 ENCOUNTER — Other Ambulatory Visit: Payer: Self-pay | Admitting: Internal Medicine

## 2013-06-23 DIAGNOSIS — N289 Disorder of kidney and ureter, unspecified: Secondary | ICD-10-CM

## 2013-06-28 ENCOUNTER — Telehealth: Payer: Self-pay | Admitting: *Deleted

## 2013-06-28 NOTE — Telephone Encounter (Signed)
Spoke with patient and advised that B/P needs to be taken prior to taking any medications. Advised to take B/P meds only if B/P is above 120/70, pt agrees to do so. Advised to increase fluids and continue with bland diet until feeling better.

## 2013-06-28 NOTE — Telephone Encounter (Signed)
Patient left message on triage line requesting advice on continuing to have low pressure. Attempted to call patient back, line was busy for 7 minutes. Will try again later.

## 2013-07-06 ENCOUNTER — Telehealth: Payer: Self-pay | Admitting: *Deleted

## 2013-07-06 DIAGNOSIS — E0789 Other specified disorders of thyroid: Secondary | ICD-10-CM | POA: Diagnosis not present

## 2013-07-06 DIAGNOSIS — M545 Low back pain, unspecified: Secondary | ICD-10-CM | POA: Diagnosis not present

## 2013-07-06 DIAGNOSIS — M1990 Unspecified osteoarthritis, unspecified site: Secondary | ICD-10-CM | POA: Diagnosis not present

## 2013-07-06 DIAGNOSIS — R894 Abnormal immunological findings in specimens from other organs, systems and tissues: Secondary | ICD-10-CM | POA: Diagnosis not present

## 2013-07-06 DIAGNOSIS — M19049 Primary osteoarthritis, unspecified hand: Secondary | ICD-10-CM | POA: Diagnosis not present

## 2013-07-06 NOTE — Telephone Encounter (Signed)
Please see me or your cardiologist before reducing the metoprolol to 12.5 mg twice a day. You can stay off the Norvasc & losartan until seen.

## 2013-07-06 NOTE — Telephone Encounter (Signed)
Spoke with patient who states her B/P is still too low (ranges in the mid to low 100's) after only taking 1/2 lopressor (25mg ) BID (Advised pt that she must continue lopressor due to Afib). She has only taken losartan once in the past week and has not taken norvasc since December. Patient wants your recommendations on how to proceed with B/P meds. Please advise.   Current List:  Norvasc 5 mg 1 po qd Losartan 100 mg 1 po qd Lopressor 50 mg  1/2 tab po bid

## 2013-07-07 NOTE — Telephone Encounter (Signed)
Called and spoke with patient and she stated that she will follow Dr. Clayborn Heron recommendations. She also stated that her BP has been coming up more over the past couple of days. JG//CMA

## 2013-07-12 DIAGNOSIS — I10 Essential (primary) hypertension: Secondary | ICD-10-CM | POA: Diagnosis not present

## 2013-07-12 DIAGNOSIS — E0789 Other specified disorders of thyroid: Secondary | ICD-10-CM | POA: Diagnosis not present

## 2013-07-12 DIAGNOSIS — R5381 Other malaise: Secondary | ICD-10-CM | POA: Diagnosis not present

## 2013-08-03 DIAGNOSIS — M542 Cervicalgia: Secondary | ICD-10-CM | POA: Diagnosis not present

## 2013-08-03 DIAGNOSIS — M5412 Radiculopathy, cervical region: Secondary | ICD-10-CM | POA: Diagnosis not present

## 2013-08-03 DIAGNOSIS — M47812 Spondylosis without myelopathy or radiculopathy, cervical region: Secondary | ICD-10-CM | POA: Diagnosis not present

## 2013-08-09 DIAGNOSIS — M47812 Spondylosis without myelopathy or radiculopathy, cervical region: Secondary | ICD-10-CM | POA: Diagnosis not present

## 2013-08-09 DIAGNOSIS — M5412 Radiculopathy, cervical region: Secondary | ICD-10-CM | POA: Diagnosis not present

## 2013-08-09 DIAGNOSIS — M542 Cervicalgia: Secondary | ICD-10-CM | POA: Diagnosis not present

## 2013-09-01 DIAGNOSIS — M5412 Radiculopathy, cervical region: Secondary | ICD-10-CM | POA: Diagnosis not present

## 2013-09-01 DIAGNOSIS — M47812 Spondylosis without myelopathy or radiculopathy, cervical region: Secondary | ICD-10-CM | POA: Diagnosis not present

## 2013-09-01 DIAGNOSIS — M542 Cervicalgia: Secondary | ICD-10-CM | POA: Diagnosis not present

## 2013-09-07 DIAGNOSIS — M47812 Spondylosis without myelopathy or radiculopathy, cervical region: Secondary | ICD-10-CM | POA: Diagnosis not present

## 2013-09-07 DIAGNOSIS — M5412 Radiculopathy, cervical region: Secondary | ICD-10-CM | POA: Diagnosis not present

## 2013-09-07 DIAGNOSIS — M542 Cervicalgia: Secondary | ICD-10-CM | POA: Diagnosis not present

## 2013-09-08 ENCOUNTER — Other Ambulatory Visit (INDEPENDENT_AMBULATORY_CARE_PROVIDER_SITE_OTHER): Payer: Medicare Other

## 2013-09-08 ENCOUNTER — Telehealth: Payer: Self-pay | Admitting: Internal Medicine

## 2013-09-08 ENCOUNTER — Encounter: Payer: Self-pay | Admitting: Internal Medicine

## 2013-09-08 ENCOUNTER — Other Ambulatory Visit: Payer: Self-pay | Admitting: *Deleted

## 2013-09-08 ENCOUNTER — Ambulatory Visit (INDEPENDENT_AMBULATORY_CARE_PROVIDER_SITE_OTHER): Payer: Medicare Other | Admitting: Internal Medicine

## 2013-09-08 VITALS — BP 130/90 | HR 72 | Temp 97.3°F | Resp 14 | Ht 65.28 in | Wt 126.0 lb

## 2013-09-08 DIAGNOSIS — N289 Disorder of kidney and ureter, unspecified: Secondary | ICD-10-CM

## 2013-09-08 DIAGNOSIS — I10 Essential (primary) hypertension: Secondary | ICD-10-CM

## 2013-09-08 DIAGNOSIS — K5730 Diverticulosis of large intestine without perforation or abscess without bleeding: Secondary | ICD-10-CM

## 2013-09-08 DIAGNOSIS — M949 Disorder of cartilage, unspecified: Secondary | ICD-10-CM

## 2013-09-08 DIAGNOSIS — E782 Mixed hyperlipidemia: Secondary | ICD-10-CM

## 2013-09-08 DIAGNOSIS — M8580 Other specified disorders of bone density and structure, unspecified site: Secondary | ICD-10-CM

## 2013-09-08 DIAGNOSIS — M899 Disorder of bone, unspecified: Secondary | ICD-10-CM | POA: Diagnosis not present

## 2013-09-08 DIAGNOSIS — Z0000 Encounter for general adult medical examination without abnormal findings: Secondary | ICD-10-CM | POA: Diagnosis not present

## 2013-09-08 LAB — HEPATIC FUNCTION PANEL
ALT: 29 U/L (ref 0–35)
AST: 37 U/L (ref 0–37)
Albumin: 4.3 g/dL (ref 3.5–5.2)
Alkaline Phosphatase: 60 U/L (ref 39–117)
Bilirubin, Direct: 0 mg/dL (ref 0.0–0.3)
Total Bilirubin: 0.8 mg/dL (ref 0.3–1.2)
Total Protein: 7.7 g/dL (ref 6.0–8.3)

## 2013-09-08 LAB — CBC WITH DIFFERENTIAL/PLATELET
Basophils Absolute: 0 10*3/uL (ref 0.0–0.1)
Basophils Relative: 0.2 % (ref 0.0–3.0)
Eosinophils Absolute: 0 10*3/uL (ref 0.0–0.7)
Eosinophils Relative: 0 % (ref 0.0–5.0)
HCT: 40.3 % (ref 36.0–46.0)
Hemoglobin: 13.4 g/dL (ref 12.0–15.0)
Lymphocytes Relative: 9.3 % — ABNORMAL LOW (ref 12.0–46.0)
Lymphs Abs: 0.9 10*3/uL (ref 0.7–4.0)
MCHC: 33.2 g/dL (ref 30.0–36.0)
MCV: 93.4 fl (ref 78.0–100.0)
Monocytes Absolute: 0.6 10*3/uL (ref 0.1–1.0)
Monocytes Relative: 6 % (ref 3.0–12.0)
Neutro Abs: 7.9 10*3/uL — ABNORMAL HIGH (ref 1.4–7.7)
Neutrophils Relative %: 84.5 % — ABNORMAL HIGH (ref 43.0–77.0)
Platelets: 246 10*3/uL (ref 150.0–400.0)
RBC: 4.31 Mil/uL (ref 3.87–5.11)
RDW: 14.6 % (ref 11.5–14.6)
WBC: 9.4 10*3/uL (ref 4.5–10.5)

## 2013-09-08 LAB — BASIC METABOLIC PANEL
BUN: 23 mg/dL (ref 6–23)
CO2: 25 mEq/L (ref 19–32)
Calcium: 10.5 mg/dL (ref 8.4–10.5)
Chloride: 103 mEq/L (ref 96–112)
Creatinine, Ser: 1.5 mg/dL — ABNORMAL HIGH (ref 0.4–1.2)
GFR: 36.84 mL/min — ABNORMAL LOW (ref >60.00)
Glucose, Bld: 94 mg/dL (ref 70–99)
Potassium: 4 mEq/L (ref 3.5–5.1)
Sodium: 139 mEq/L (ref 135–145)

## 2013-09-08 LAB — LIPID PANEL
Cholesterol: 254 mg/dL — ABNORMAL HIGH (ref 0–200)
HDL: 83.5 mg/dL (ref >39.00)
LDL Cholesterol: 154 mg/dL — ABNORMAL HIGH (ref 0–99)
Total CHOL/HDL Ratio: 3
Triglycerides: 85 mg/dL (ref 0.0–149.0)
VLDL: 17 mg/dL (ref 0.0–40.0)

## 2013-09-08 MED ORDER — LOSARTAN POTASSIUM 100 MG PO TABS: 100.0000 mg | ORAL_TABLET | Freq: Every day | ORAL | Status: DC

## 2013-09-08 MED ORDER — METOPROLOL TARTRATE 50 MG PO TABS: ORAL_TABLET | ORAL | Status: DC

## 2013-09-08 MED ORDER — AMLODIPINE BESYLATE 5 MG PO TABS: 5.0000 mg | ORAL_TABLET | Freq: Every day | ORAL | Status: DC

## 2013-09-08 NOTE — Patient Instructions (Signed)
Your next office appointment will be determined based upon review of your pending labs . Those instructions will be transmitted to you  by mail. Minimal Blood Pressure Goal= AVERAGE < 140/90;  Ideal is an AVERAGE < 135/85. This AVERAGE should be calculated from @ least 5-7 BP readings taken @ different times of day on different days of week. You should not respond to isolated BP readings , but rather the AVERAGE for that week .Please bring your  blood pressure cuff to office visits to verify that it is reliable.It  can also be checked against the blood pressure device at the pharmacy. Finger or wrist cuffs are not dependable; an arm cuff is. Perform isometric exercise of calves  ( while seated go up on toes to count of 5 & then onto heels for 5 count). Repeat  4- 5 times prior to standing if you've been seated or supine for any significant period of time as BP drops with such positions.

## 2013-09-08 NOTE — Progress Notes (Signed)
Pre visit review using our clinic review tool, if applicable. No additional management support is needed unless otherwise documented below in the visit note. 

## 2013-09-08 NOTE — Telephone Encounter (Signed)
Relevant patient education assigned to patient using Emmi. ° °

## 2013-09-08 NOTE — Progress Notes (Signed)
 Subjective:    Patient ID: Deborah Shields, female    DOB: 01/24/1941, 73 y.o.   MRN: [id number]  HPI Medicare Wellness Visit: Psychosocial and medical history were reviewed as required by Medicare (history related to abuse, antisocial behavior , firearm risk). Social history: Caffeine: 3 oz  & 1 coke/ day , Alcohol:no  , Tobacco use:never Exercise:see below Personal safety/fall risk:no Limitations of activities of daily living:no Seatbelt/ smoke alarm use:yes Healthcare Power of Attorney/Living Will status: UTD Ophthalmologic exam status:UTD Hearing evaluation status:UTD Orientation: Oriented X 3 Memory and recall: good Spelling testing: good Depression/anxiety assessment: no Foreign travel history:[place] 2007 Immunization status for influenza/pneumonia/ shingles /tetanus:UTD Transfusion history:no Preventive health care maintenance status:  Colonoscopy;BMD on schedule Mammogram/Gyn exam as per protocol/standard care:performed by PA @ [facility] care:every 6 mos Chart reviewed and updated. Active issues reviewed and addressed as documented below.    Review of Systems Blood pressure range / average : 130/40s-50s Compliant with anti hypertemsive medication. Some lightheadedness as adverse medication effect to ARB described. Dose decreased to 1/2 bid. A heart healthy /low salt diet is followed. Exercise encompasses 60 minutes 3  times per week as  walking without symptoms.  Family history is + for CVA Significant headaches, epistaxis, chest pain, palpitations, exertional dyspnea, claudication, paroxysmal nocturnal dyspnea, or edema absent.     Objective:   Physical Exam Gen.: Thin but healthy and well-nourished in appearance. Alert, appropriate and cooperative throughout exam. Appears younger than stated age  Head: Normocephalic without obvious abnormalities Eyes: No corneal or conjunctival inflammation noted. Pupils equal round reactive to light and accommodation.  Extraocular motion intact. Ears: External  ear exam reveals no significant lesions or deformities. Canals clear .TMs normal. Hearing is grossly normal bilaterally. Nose: External nasal exam reveals no deformity or inflammation. Nasal mucosa are pink and moist. No lesions or exudates noted.   Mouth: Oral mucosa and oropharynx reveal no lesions or exudates. Upper plate,lower teeth in good repair. Neck: No deformities, masses, or tenderness noted. Range of motion &. Thyroid normal Lungs: Normal respiratory effort; chest expands symmetrically. Lungs are clear to auscultation without rales, wheezes, or increased work of breathing. Heart: Normal rate and rhythm. Normal S1 and S2. No gallop, click, or rub. No  murmur. Abdomen: Bowel sounds normal; abdomen soft and nontender. No masses, organomegaly or hernias noted. Genitalia: as per PA                                 Musculoskeletal/extremities: No deformity or scoliosis noted of  the thoracic or lumbar spine.   No clubbing, cyanosis, edema, or significant extremity  deformity noted. Range of motion normal .Tone & strength normal. Hand joints reveal marked PIP/ DIP changes.  Fingernail  health good. Able to lie down & sit up w/o help. Negative SLR bilaterally Vascular: Carotid, radial artery, dorsalis pedis and  posterior tibial pulses are full and equal. No bruits present. Neurologic: Alert and oriented x3. Deep tendon reflexes symmetrical and normal.    Skin: Intact without suspicious lesions or rashes. Lymph: No cervical, axillary lymphadenopathy present. Psych: Mood and affect are normal. Normally interactive                                                                                          Assessment & Plan:  #1 Medicare Wellness Exam; criteria met ; data entered #2 Problem List/Diagnoses reviewed Plan:  Assessments made/ Orders entered  

## 2013-09-12 LAB — VITAMIN D 1,25 DIHYDROXY
Vitamin D 1, 25 (OH)2 Total: 34 pg/mL (ref 18–72)
Vitamin D2 1, 25 (OH)2: <8 pg/mL
Vitamin D3 1, 25 (OH)2: 34 pg/mL

## 2013-09-13 DIAGNOSIS — M949 Disorder of cartilage, unspecified: Secondary | ICD-10-CM | POA: Diagnosis not present

## 2013-09-13 DIAGNOSIS — M899 Disorder of bone, unspecified: Secondary | ICD-10-CM | POA: Diagnosis not present

## 2013-10-06 ENCOUNTER — Encounter: Payer: Self-pay | Admitting: Internal Medicine

## 2013-10-13 ENCOUNTER — Encounter: Payer: Self-pay | Admitting: Family

## 2013-10-14 ENCOUNTER — Encounter: Payer: Self-pay | Admitting: Family

## 2013-10-14 ENCOUNTER — Ambulatory Visit (INDEPENDENT_AMBULATORY_CARE_PROVIDER_SITE_OTHER): Payer: Medicare Other | Admitting: Family

## 2013-10-14 ENCOUNTER — Ambulatory Visit (HOSPITAL_COMMUNITY)
Admission: RE | Admit: 2013-10-14 | Discharge: 2013-10-14 | Disposition: A | Discharge status: Home / Self Care | Payer: Medicare Other | Source: Ambulatory Visit | Attending: Family | Admitting: Family

## 2013-10-14 ENCOUNTER — Encounter: Payer: Self-pay | Admitting: Internal Medicine

## 2013-10-14 VITALS — BP 119/70 | HR 60 | Resp 16 | Ht 66.5 in | Wt 126.0 lb

## 2013-10-14 DIAGNOSIS — I6529 Occlusion and stenosis of unspecified carotid artery: Secondary | ICD-10-CM | POA: Diagnosis not present

## 2013-10-14 DIAGNOSIS — I658 Occlusion and stenosis of other precerebral arteries: Secondary | ICD-10-CM | POA: Insufficient documentation

## 2013-10-14 NOTE — Progress Notes (Signed)
Established Carotid Patient   History of Present Illness  Deborah Shields is a 73 y.o. female patient of Dr. [name] who has known right ICA tortuosity and left ICA is c/w fibromuscular dysplasia. She returns today for scheduled surveillance.  Patient denies ever having a stroke of TIA symptoms, denies claudication, denies non-healing wounds.  The patient denies amaurosis fugax or monocular blindness. The patient denies facial drooping.  Pt. denies hemiplegia. The patient denies receptive or expressive aphasia. Pt. denies extremity weakness.   Sees a cardiologist due to family hx of CAD and personal hx of murmur.  Patient denies New Medical or Surgical History other than having corticosteroid injections in her T-spine, Dr. Maryjean Ka, for pain in the left shoulder, tingling and numbness in left arm/hand which helped in the beginning.   Pt Diabetic: No  Pt smoker: non-smoker   Pt meds include:  Statin : No: all statins tried caused very elevated liver enzymes and muscle weakness/severe cramping  Betablocker: Yes  ASA: Yes  Other anticoagulants/antiplatelets: no  Past Medical History  Diagnosis Date  . Diverticulosis of colon   . Hypothyroidism     Dr Wilson Singer  . Hyperlipidemia     NMR 07/2009: LDL 200 (2260/1233)TG 99, HDL 65. LDL goal =<120, ideally <100. father MI @ 90  . Hypertension   . Microscopic hematuria   . Degenerative joint disease   . Peripheral vascular disease     ICA bilat, Dr.Charles fields, [facility]  . Carotid artery occlusion   . Heart murmur   . Anxiety   . Peripheral neuropathy     compressive in UE bilaterally; Dr Daylene Katayama  . Aortic insufficiency     mild due to degenerative changes  . Basal cell carcinoma 2007    [facility] Left leg    Social History History  Substance Use Topics  . Smoking status: Never Smoker   . Smokeless tobacco: Never Used  . Alcohol Use: No    Family History Family History  Problem Relation Age of Onset  . Heart attack Father 57   . Heart disease Father     Before age 47  . Colon cancer Mother 78  . Stroke Mother 33  . Kidney disease Mother     ? renal calculi; S/P resecton of kidney  . Cancer Mother 59    Colon  . Ovarian cancer Sister   . Other Sister     Valve replacement (aortic & mitral ) in 2 sisters  . Heart disease Sister     Before age 73  . Heart disease Brother     aortic & mitral valve replacement in 2 bro; both had CBAG  . Diabetes Neg Hx     Surgical History Past Surgical History  Procedure Laterality Date  . Abdominal hysterectomy  1973    age 22 due to dysfuctional menses; HRT x 25-30 years  . Bilateral oophorectomy  1990    prophylactically (sister had ovarian ca)  . Appendectomy  1952  . Cholecystectomy  2006  . Tonsillectomy  1957  . Orthopedic surgery  1989/200/2002/2012    elbows,shoulder surgery x 3, right hand  . Carpal tunnel release  1989    right  . Basal cell cancer  03/2006    leg  . Cataract extraction, bilateral  2010    Dr Kathrin Penner  . US echocardiography  05/01/2010    trace MR,AI,TR;EF =>55%  . Nm myoview ltd  06/13/2008    low risk scan  .  Eye surgery      Bilateral eye    Allergies  Allergen Reactions  . Penicillins Itching and Swelling    REACTION: itching and edema  . Aspirin Nausea Only    REACTION: GI upset  ( pt can take 81 mg but NOT   325 mg ASA )  . Codeine Nausea And Vomiting    REACTION: GI upset  . Lovastatin Nausea Only    REACTION: nausea  . Benazepril Hcl   . Bupropion Hcl     REACTION: tinnitis  . Ezetimibe     REACTION: GI symptoms  . Fenofibrate     Myalgia   . Paroxetine   . Pravastatin Sodium Other (See Comments)    REACTION: elevated CPK - Muscle's in bilateral Leg    Current Outpatient Prescriptions  Medication Sig Dispense Refill  . amLODipine (NORVASC) 5 MG tablet Take 1 tablet (5 mg total) by mouth daily.  90 tablet  3  . aspirin 81 MG tablet Take 81 mg by mouth daily.        Marland Kitchen b complex vitamins tablet Take 1  tablet by mouth daily.        . Calcium Carbonate-Vitamin D (CALCIUM-VITAMIN D) 600-200 MG-UNIT CAPS Take by mouth.        . Cholecalciferol (VITAMIN D) 1000 UNITS capsule Take 1,000 Units by mouth daily.        . clobetasol cream (TEMOVATE) 0.05 %       . cycloSPORINE (RESTASIS) 0.05 % ophthalmic emulsion 1 drop 2 (two) times daily.        . hydroxychloroquine (PLAQUENIL) 200 MG tablet 2 (two) times daily. Arthritist      . levothyroxine (SYNTHROID) 88 MCG tablet Take 100 mcg by mouth daily. Taking the brand "synthroid"      . LORazepam (ATIVAN) 0.5 MG tablet Take 1 tablet (0.5 mg total) by mouth 3 (three) times daily as needed.  90 tablet  0  . losartan (COZAAR) 100 MG tablet Take 1 tablet (100 mg total) by mouth daily.  90 tablet  3  . meclizine (ANTIVERT) 25 MG tablet Take 1 tablet (25 mg total) by mouth daily. 1/2 every 8 hrs prn for imbalance  30 tablet  2  . metoprolol (LOPRESSOR) 50 MG tablet 1/2 by mouth two times daily  90 tablet  3  . Multiple Vitamins-Minerals (CENTRUM SILVER PO) Take by mouth.        . traMADol (ULTRAM) 50 MG tablet 1/2-1 by mouth once daily as needed.  90 tablet  0  . vitamin E 100 UNIT capsule Take 100 Units by mouth daily.       . hydrocortisone (PROCTOZONE-HC) 2.5 % rectal cream Place rectally 2 (two) times daily.  30 g  1   No current facility-administered medications for this visit.    Review of Systems : See HPI for pertinent positives and negatives.  Physical Examination  Filed Vitals:   10/14/13 1453  BP: 119/70  Pulse: 60  Resp: 16   Filed Weights   10/14/13 1453  Weight: 126 lb (57.153 kg)   Body mass index is 20.03 kg/(m^2).  General: WDWN female in NAD  GAIT: normal  Eyes: PERRLA  Pulmonary: CTAB, Negative Rales, Negative rhonchi, & Negative wheezing.  Cardiac: regular Rhythm , No Murmurs detected.   VASCULAR EXAM  Carotid Bruits  Left  Right    Negative  Negative    Aorta is not palpable.  Radial pulses are 2+ palpable and  equal.   LE Pulses  LEFT  RIGHT   POPLITEAL  not palpable  not palpable   POSTERIOR TIBIAL  palpable  palpable   DORSALIS PEDIS  ANTERIOR TIBIAL  palpable  palpable   Gastrointestinal: soft, nontender, BS WNL, no r/g, negative masses.  Musculoskeletal: Negative muscle atrophy/wasting. M/S 5/5 throughout, Extremities without ischemic changes.  Neurologic: A&O X 3; Appropriate Affect ; SENSATION ;normal;  Speech is normal  CN 2-12 intact, Pain and light touch intact in extremities, Motor exam as listed above.  Non-Invasive Vascular Imaging  CAROTID DUPLEX 04/15/2013  Right ICA: 60 - 79 % stenosis. Elevated velocities in the mid-distal ICA appear to be a kink in the vessel, no plaque noted..  Left ICA: 60 - 79 % stenosis. Elevated velocities in the distal ICA are consistent with fibromuscular dysplasia, no plaque observed.  These findings are Unchanged from previous exam.   Non-Invasive Vascular Imaging CAROTID DUPLEX 10/14/2013   CEREBROVASCULAR DUPLEX EVALUATION    INDICATION: Carotid disease    PREVIOUS INTERVENTION(S):     DUPLEX EXAM:     RIGHT  LEFT  Peak Systolic Velocities (cm/s) End Diastolic Velocities (cm/s) Plaque LOCATION Peak Systolic Velocities (cm/s) End Diastolic Velocities (cm/s) Plaque  78 17  CCA PROXIMAL 70 19   70 19  CCA MID 67 21   55 18  CCA DISTAL 66 20   61 11  ECA 47 8   40 16 HT ICA PROXIMAL 38 13 HT  125 42  ICA MID 136 43   88 33  ICA DISTAL 57 21     2.3 ICA / CCA Ratio (PSV) 2.1  Antegrade Vertebral Flow Antegrade   Brachial Systolic Pressure (mmHg)   Multiphasic (subclavian artery) Brachial Artery Waveforms Multiphasic (subclavian artery)    Plaque Morphology:  HM = Homogeneous, HT = Heterogeneous, CP = Calcific Plaque, SP = Smooth Plaque, IP = Irregular Plaque     ADDITIONAL FINDINGS:   No significant stenosis of the bilateral external or common carotid arteries.   Minimal plaque formations noted at the bilateral internal carotid artery  origins.    IMPRESSION: 1. Doppler velocities suggest less than 40% stenoses of the bilateral proximal internal carotid arteries.  2. Elevated velocities noted in the bilateral mid internal carotid arteries which appear to be due to vessel tortuosity.    Compared to the previous exam:  Maximum velocity of the right mid-distal internal carotid artery appears less than previously recorded on the exam from 04/15/13 with the left internal carotid artery remaining stable.     Assessment:   MISHAELA KEVILLE is a 73 y.o. female who presents with asymptomatic minimal bilateral ICA stenosis, no significant stenosis of the bilateral external or common carotid arteries, and elevated velocities noted in the bilateral mid internal carotid arteries which appear to be due to vessel tortuosity.  The  ICA stenosis is  Improved from previous exam.  Plan: Follow-up in 1 year with Carotid Duplex scan.   I discussed in depth with the patient the nature of atherosclerosis, and emphasized the importance of maximal medical management including strict control of blood pressure, blood glucose, and lipid levels, obtaining regular exercise, and continued cessation of smoking.  The patient is aware that without maximal medical management the underlying atherosclerotic disease process will progress, limiting the benefit of any interventions. The patient was given information about stroke prevention and what symptoms should prompt the patient to seek immediate medical care. Thank you for allowing [place] to participate  in this patient's care.  Clemon Chambers, RN, MSN, FNP-C [facility] Office: [phone]  Clinic Physician: [name]  10/14/2013 2:59 PM

## 2013-10-15 NOTE — Addendum Note (Signed)
Addended by: Dorthula Rue L on: 10/15/2013 04:37 PM   Modules accepted: Orders

## 2013-10-18 DIAGNOSIS — E0789 Other specified disorders of thyroid: Secondary | ICD-10-CM | POA: Diagnosis not present

## 2013-10-20 DIAGNOSIS — E0789 Other specified disorders of thyroid: Secondary | ICD-10-CM | POA: Diagnosis not present

## 2013-10-20 DIAGNOSIS — IMO0002 Reserved for concepts with insufficient information to code with codable children: Secondary | ICD-10-CM | POA: Diagnosis not present

## 2013-10-20 DIAGNOSIS — I10 Essential (primary) hypertension: Secondary | ICD-10-CM | POA: Diagnosis not present

## 2013-10-25 DIAGNOSIS — Z961 Presence of intraocular lens: Secondary | ICD-10-CM | POA: Diagnosis not present

## 2013-10-25 DIAGNOSIS — H04129 Dry eye syndrome of unspecified lacrimal gland: Secondary | ICD-10-CM | POA: Diagnosis not present

## 2013-10-25 DIAGNOSIS — H2640 Unspecified secondary cataract: Secondary | ICD-10-CM | POA: Diagnosis not present

## 2013-10-25 DIAGNOSIS — Z79899 Other long term (current) drug therapy: Secondary | ICD-10-CM | POA: Diagnosis not present

## 2013-10-31 ENCOUNTER — Encounter: Payer: Self-pay | Admitting: Internal Medicine

## 2013-11-02 DIAGNOSIS — M542 Cervicalgia: Secondary | ICD-10-CM | POA: Diagnosis not present

## 2013-11-02 DIAGNOSIS — M5412 Radiculopathy, cervical region: Secondary | ICD-10-CM | POA: Diagnosis not present

## 2013-11-30 DIAGNOSIS — M545 Low back pain, unspecified: Secondary | ICD-10-CM | POA: Diagnosis not present

## 2013-11-30 DIAGNOSIS — M47812 Spondylosis without myelopathy or radiculopathy, cervical region: Secondary | ICD-10-CM | POA: Diagnosis not present

## 2013-11-30 DIAGNOSIS — M5412 Radiculopathy, cervical region: Secondary | ICD-10-CM | POA: Diagnosis not present

## 2013-11-30 DIAGNOSIS — M542 Cervicalgia: Secondary | ICD-10-CM | POA: Diagnosis not present

## 2013-12-01 DIAGNOSIS — L821 Other seborrheic keratosis: Secondary | ICD-10-CM | POA: Diagnosis not present

## 2013-12-01 DIAGNOSIS — Z85828 Personal history of other malignant neoplasm of skin: Secondary | ICD-10-CM | POA: Diagnosis not present

## 2013-12-01 DIAGNOSIS — L570 Actinic keratosis: Secondary | ICD-10-CM | POA: Diagnosis not present

## 2013-12-01 DIAGNOSIS — E0789 Other specified disorders of thyroid: Secondary | ICD-10-CM | POA: Diagnosis not present

## 2013-12-01 DIAGNOSIS — D1801 Hemangioma of skin and subcutaneous tissue: Secondary | ICD-10-CM | POA: Diagnosis not present

## 2013-12-01 DIAGNOSIS — D1739 Benign lipomatous neoplasm of skin and subcutaneous tissue of other sites: Secondary | ICD-10-CM | POA: Diagnosis not present

## 2013-12-01 DIAGNOSIS — D239 Other benign neoplasm of skin, unspecified: Secondary | ICD-10-CM | POA: Diagnosis not present

## 2013-12-01 DIAGNOSIS — L819 Disorder of pigmentation, unspecified: Secondary | ICD-10-CM | POA: Diagnosis not present

## 2013-12-31 ENCOUNTER — Ambulatory Visit (INDEPENDENT_AMBULATORY_CARE_PROVIDER_SITE_OTHER): Payer: Medicare Other | Admitting: Internal Medicine

## 2013-12-31 ENCOUNTER — Encounter: Payer: Self-pay | Admitting: Internal Medicine

## 2013-12-31 ENCOUNTER — Other Ambulatory Visit (INDEPENDENT_AMBULATORY_CARE_PROVIDER_SITE_OTHER): Payer: Medicare Other

## 2013-12-31 VITALS — BP 124/90 | HR 60 | Temp 97.8°F | Wt 124.6 lb

## 2013-12-31 DIAGNOSIS — I6529 Occlusion and stenosis of unspecified carotid artery: Secondary | ICD-10-CM

## 2013-12-31 DIAGNOSIS — R0989 Other specified symptoms and signs involving the circulatory and respiratory systems: Secondary | ICD-10-CM

## 2013-12-31 DIAGNOSIS — I10 Essential (primary) hypertension: Secondary | ICD-10-CM

## 2013-12-31 DIAGNOSIS — R0609 Other forms of dyspnea: Secondary | ICD-10-CM

## 2013-12-31 DIAGNOSIS — R5383 Other fatigue: Secondary | ICD-10-CM | POA: Diagnosis not present

## 2013-12-31 DIAGNOSIS — R5381 Other malaise: Secondary | ICD-10-CM

## 2013-12-31 DIAGNOSIS — R0600 Dyspnea, unspecified: Secondary | ICD-10-CM

## 2013-12-31 DIAGNOSIS — R002 Palpitations: Secondary | ICD-10-CM | POA: Diagnosis not present

## 2013-12-31 LAB — BASIC METABOLIC PANEL
BUN: 23 mg/dL (ref 6–23)
CO2: 27 mEq/L (ref 19–32)
Calcium: 10.3 mg/dL (ref 8.4–10.5)
Chloride: 102 mEq/L (ref 96–112)
Creatinine, Ser: 1.8 mg/dL — ABNORMAL HIGH (ref 0.4–1.2)
GFR: 28.95 mL/min — ABNORMAL LOW (ref >60.00)
Glucose, Bld: 80 mg/dL (ref 70–99)
Potassium: 4.2 mEq/L (ref 3.5–5.1)
Sodium: 139 mEq/L (ref 135–145)

## 2013-12-31 LAB — HEPATIC FUNCTION PANEL
ALT: 22 U/L (ref 0–35)
AST: 34 U/L (ref 0–37)
Albumin: 3.7 g/dL (ref 3.5–5.2)
Alkaline Phosphatase: 61 U/L (ref 39–117)
Bilirubin, Direct: 0.1 mg/dL (ref 0.0–0.3)
Total Bilirubin: 0.8 mg/dL (ref 0.2–1.2)
Total Protein: 6.7 g/dL (ref 6.0–8.3)

## 2013-12-31 LAB — CBC WITH DIFFERENTIAL/PLATELET
Basophils Absolute: 0 10*3/uL (ref 0.0–0.1)
Basophils Relative: 0.2 % (ref 0.0–3.0)
Eosinophils Absolute: 0 10*3/uL (ref 0.0–0.7)
Eosinophils Relative: 0 % (ref 0.0–5.0)
HCT: 39.5 % (ref 36.0–46.0)
Hemoglobin: 13.1 g/dL (ref 12.0–15.0)
Lymphocytes Relative: 24.4 % (ref 12.0–46.0)
Lymphs Abs: 1.6 10*3/uL (ref 0.7–4.0)
MCHC: 33.1 g/dL (ref 30.0–36.0)
MCV: 93.4 fl (ref 78.0–100.0)
Monocytes Absolute: 0.7 10*3/uL (ref 0.1–1.0)
Monocytes Relative: 10.5 % (ref 3.0–12.0)
Neutro Abs: 4.2 10*3/uL (ref 1.4–7.7)
Neutrophils Relative %: 64.9 % (ref 43.0–77.0)
Platelets: 234 10*3/uL (ref 150.0–400.0)
RBC: 4.23 Mil/uL (ref 3.87–5.11)
RDW: 12.8 % (ref 11.5–15.5)
WBC: 6.5 10*3/uL (ref 4.0–10.5)

## 2013-12-31 NOTE — Patient Instructions (Addendum)
Your next office appointment will be determined based upon review of your pending labs . Those instructions will be transmitted to you through My Chart  To treat the hoarseness & post nasal drainage follow this regimen:Plain Mucinex (NOT D) for thick secretions ;force NON dairy fluids .  Nasal cleansing in the shower as discussed with lather of mild shampoo.After 10 seconds wash off lather while  exhaling through nostrils. Make sure that all residual soap is removed to prevent irritation.  Flonase OR Nasacort AQ 1 spray in each nostril twice a day as needed. Use the "crossover" technique into opposite nostril spraying toward opposite ear @ 45 degree angle, not straight up into nostril.  Use a Neti pot daily only  as needed for significant sinus congestion; going from open side to congested side . Plain Allegra (NOT D )  160 daily , Loratidine 10 mg , OR Zyrtec 10 mg @ bedtime  as needed for itchy eyes & sneezing.

## 2013-12-31 NOTE — Progress Notes (Signed)
   Subjective:    Patient ID: Deborah Shields, female    DOB: 10/03/1940, 73 y.o.   MRN: [id number]  HPI   She describes exertional dyspnea associated with palpitations & dizziness since she had a respiratory infection in late 2014. This occurs after walking half a block or performing housework. She denies associated chest pain or diaphoresis.  She is due to see her cardiologist in the near future.  Her blood pressure average is 120/76; ranges 102/65-138/90.  Her hair is fine and she describes hoarseness & some fatigue. Her endocrinologist is adjusting her thyroid medicine. The hoarseness is is in the context of post nasal drainage.  She's on Plaquenil from her rheumatologist. This has caused constipation.    Review of Systems  Constitutional: No significant change in weight; sleep disorder; change in appetite. Eye: no blurred, double ,loss of vision Neuro: no new numbness or tingling; tremor GI: no melena or rectal bleeding     Objective:   Physical Exam  Significant or distinguishing  findings on physical exam are documented first.  Below that are other systems examined & findings.  She is thin but appears adequately nourished. She is in no acute distress  She has an upper dental plate.  There is accentuation of the second heart sound.  She has severe mixed arthritic change in her hands  Dorsalis pedis pulses are decreased.  There is a 2 x 4 mm lipoma in the right upper quadrant which is movable and transilluminates.  No carotid bruits are present.No neck pain distention present at 10 - 15 degrees. Thyroid normal to palpation  Heart rhythm and rate are normal with no gallop or murmur  Chest is clear with no increased work of breathing  There is no evidence of aortic aneurysm or renal artery bruits  Abdomen soft with no organomegaly or masses. No HJR  No clubbing, cyanosis or edema present.  No ischemic skin changes are present . Fingernails healthy   Alert  and oriented. Strength, tone, DTRs reflexes normal           Assessment & Plan:  #1 DOE; R/O anginal variant #2 fatigue #3 HTN See orders & AVS

## 2013-12-31 NOTE — Progress Notes (Signed)
Pre visit review using our clinic review tool, if applicable. No additional management support is needed unless otherwise documented below in the visit note. 

## 2014-01-01 LAB — TROPONIN I: Troponin I: 0.01 ng/mL (ref <0.06)

## 2014-01-03 ENCOUNTER — Telehealth: Payer: Self-pay

## 2014-01-03 NOTE — Telephone Encounter (Signed)
Message copied by Shelly Coss on Mon Jan 03, 2014  9:41 AM ------      Message from: Hendricks Limes      Created: Sat Jan 01, 2014  7:48 AM       Please FAX office visit & lab results to Dr Trudie Reed       ------

## 2014-01-03 NOTE — Telephone Encounter (Signed)
Last office note and last labs have been faxed to Dr Trudie Reed at [phone]

## 2014-01-04 DIAGNOSIS — R894 Abnormal immunological findings in specimens from other organs, systems and tissues: Secondary | ICD-10-CM | POA: Diagnosis not present

## 2014-01-04 DIAGNOSIS — M2550 Pain in unspecified joint: Secondary | ICD-10-CM | POA: Diagnosis not present

## 2014-01-04 DIAGNOSIS — M1990 Unspecified osteoarthritis, unspecified site: Secondary | ICD-10-CM | POA: Diagnosis not present

## 2014-01-04 DIAGNOSIS — M19049 Primary osteoarthritis, unspecified hand: Secondary | ICD-10-CM | POA: Diagnosis not present

## 2014-02-02 DIAGNOSIS — E0789 Other specified disorders of thyroid: Secondary | ICD-10-CM | POA: Diagnosis not present

## 2014-02-14 ENCOUNTER — Ambulatory Visit (INDEPENDENT_AMBULATORY_CARE_PROVIDER_SITE_OTHER): Payer: Medicare Other | Admitting: Cardiovascular Disease

## 2014-02-14 ENCOUNTER — Encounter (HOSPITAL_COMMUNITY): Payer: Self-pay | Admitting: *Deleted

## 2014-02-14 ENCOUNTER — Encounter: Payer: Self-pay | Admitting: Cardiovascular Disease

## 2014-02-14 VITALS — BP 130/80 | HR 63 | Resp 20 | Ht 66.5 in | Wt 124.0 lb

## 2014-02-14 DIAGNOSIS — R0609 Other forms of dyspnea: Secondary | ICD-10-CM | POA: Diagnosis not present

## 2014-02-14 DIAGNOSIS — I4891 Unspecified atrial fibrillation: Secondary | ICD-10-CM

## 2014-02-14 DIAGNOSIS — I10 Essential (primary) hypertension: Secondary | ICD-10-CM | POA: Diagnosis not present

## 2014-02-14 DIAGNOSIS — I480 Paroxysmal atrial fibrillation: Secondary | ICD-10-CM

## 2014-02-14 DIAGNOSIS — R0989 Other specified symptoms and signs involving the circulatory and respiratory systems: Secondary | ICD-10-CM

## 2014-02-14 DIAGNOSIS — J449 Chronic obstructive pulmonary disease, unspecified: Secondary | ICD-10-CM | POA: Insufficient documentation

## 2014-02-14 DIAGNOSIS — R0600 Dyspnea, unspecified: Secondary | ICD-10-CM

## 2014-02-14 DIAGNOSIS — I6529 Occlusion and stenosis of unspecified carotid artery: Secondary | ICD-10-CM

## 2014-02-14 DIAGNOSIS — E782 Mixed hyperlipidemia: Secondary | ICD-10-CM | POA: Diagnosis not present

## 2014-02-14 MED ORDER — LOSARTAN POTASSIUM 100 MG PO TABS: 50.0000 mg | ORAL_TABLET | Freq: Every day | ORAL | Status: DC

## 2014-02-14 NOTE — Assessment & Plan Note (Signed)
She has severe hypercholesterolemia and may have heterozygous familial hypercholesterolemia. She has been intolerant of all statins. He identifying coronary disease, the need for treatment will become much more pressing. Consider PCSK9 inhibitors.

## 2014-02-14 NOTE — Assessment & Plan Note (Signed)
She has exertional dizziness and near syncope, often suggestive of a drop in blood pressure. Have asked her to reduce her losartan to 50 mg daily

## 2014-02-14 NOTE — Assessment & Plan Note (Signed)
This appears moderately severe and is a striking change from her previous exercise tolerance. By clinical exam there is no overt evidence of congestive heart failure. We'll recheck cholesterol ventricular ejection fraction and evaluate the aortic valve for evidence of progressive insufficiency (although by exam this is also not apparent). In a patient with hypertension and hypercholesterolemia and possible PAD (carotid disease) is important to also screen for coronary insufficiency as a cause of her symptoms. Her dyspnea could be an angina equivalent. Will order a nuclear stress test. She cannot exercise on the treadmill. Her last nuclear stress test was performed in 2009 and was also a vasodilator stress test.

## 2014-02-14 NOTE — Patient Instructions (Signed)
  We will see you back in follow up after the tests with Dr Sallyanne Kuster.  Dr Sallyanne Kuster has ordered: 1.  Echocardiogram. Echocardiography is a painless test that uses sound waves to create images of your heart. It provides your doctor with information about the size and shape of your heart and how well your heart's chambers and valves are working. This procedure takes approximately one hour. There are no restrictions for this procedure.   2. Lexiscan Myoview- this is a test that looks at the blood flow to your heart muscle.  It takes approximately 2 1/2 hours. Please follow instruction sheet, as given.   3. Medication changes-                                    Decrease: Losartan to 50mg  daily (1/2 tablet of 100mg )

## 2014-02-14 NOTE — Assessment & Plan Note (Signed)
Her symptoms are exertional, and therefore unlikely to be explained by an arrhythmia. She has only had one episode of atrial fibrillation that we aren't aware of, and this happened around the time of surgery. She is not on full anti-coagulation mostly at her request. Borderline CHADSVasc score if one discounts the carotid disease. She did take Xarelto for a while after the surgery. Now on aspirin. If the echo and stress test do not show evidence of significant structural heart disease, consider an event monitor for atrial fibrillation

## 2014-02-14 NOTE — Progress Notes (Signed)
Reason for office visit Shortness of breath  Deborah Shields is a 73 year old woman with long-standing treated hypertension and hypercholesterolemia, mild degenerative aortic valve insufficiency and a single episode of perioperative paroxysmal atrial fibrillation.   She had a history of moderate stenosis in the left internal carotid artery, but no history of stroke or TIA. Ultrasonography performed in October 2014 shows no evidence of stenosis anymore.  There has been a marked reduction in her functional capacity just over the last few months. She lives independently and was always full of energy. She now has NYHA functional class III exertional dyspnea. Simply going down or climbing back up the 4 steps to her door make her feel exhausted. If she has to walk longer distances she often becomes lightheaded feels extremely weak and hot. On one occasion she had "tunnel vision" and almost "blacked out". She has to stop and slowly recovers. Often when she goes shopping she develops these complaints. She has not experience full-blown syncope. She denies any pressure or pain in her chest. She has not been aware of any palpitations or irregularity in her heart rate.   Allergies  Allergen Reactions  . Penicillins Itching and Swelling    REACTION: itching and edema  . Aspirin Nausea Only    REACTION: GI upset  ( pt can take 81 mg but NOT   325 mg ASA )  . Codeine Nausea And Vomiting    REACTION: GI upset  . Lovastatin Nausea Only    REACTION: nausea  . Benazepril Hcl   . Bupropion Hcl     REACTION: tinnitis  . Ezetimibe     REACTION: GI symptoms  . Fenofibrate     Myalgia   . Paroxetine   . Pravastatin Sodium Other (See Comments)    REACTION: elevated CPK - Muscle's in bilateral Leg    Current Outpatient Prescriptions  Medication Sig Dispense Refill  . amLODipine (NORVASC) 5 MG tablet Take 1 tablet (5 mg total) by mouth daily.  90 tablet  3  . aspirin 81 MG tablet Take 81 mg by mouth  daily.        Marland Kitchen b complex vitamins tablet Take 1 tablet by mouth daily.        . Calcium Carbonate-Vitamin D (CALCIUM-VITAMIN D) 600-200 MG-UNIT CAPS Take by mouth.        . Cholecalciferol (VITAMIN D) 1000 UNITS capsule Take 1,000 Units by mouth daily.        . clobetasol cream (TEMOVATE) 5.36 % Apply 1 application topically as needed.       . cycloSPORINE (RESTASIS) 0.05 % ophthalmic emulsion 1 drop 2 (two) times daily.        Marland Kitchen levothyroxine (SYNTHROID) 88 MCG tablet Take 100 mcg by mouth daily. Taking the brand "synthroid"  Alternates 77mg with 1030m every other day      . LORazepam (ATIVAN) 0.5 MG tablet Take 1 tablet (0.5 mg total) by mouth 3 (three) times daily as needed.  90 tablet  0  . losartan (COZAAR) 100 MG tablet Take 0.5 tablets (50 mg total) by mouth daily.  90 tablet  3  . meclizine (ANTIVERT) 25 MG tablet Take 1 tablet (25 mg total) by mouth daily. 1/2 every 8 hrs prn for imbalance  30 tablet  2  . metoprolol (LOPRESSOR) 50 MG tablet 1/2 by mouth two times daily  90 tablet  3  . Multiple Vitamins-Minerals (CENTRUM SILVER PO) Take by mouth.        .Marland Kitchen  traMADol (ULTRAM) 50 MG tablet 1/2-1 by mouth once daily as needed.  90 tablet  0  . vitamin E 100 UNIT capsule Take 100 Units by mouth daily.        No current facility-administered medications for this visit.    Past Medical History  Diagnosis Date  . Diverticulosis of colon   . Hypothyroidism     Dr Wilson Singer  . Hyperlipidemia     NMR 07/2009: LDL 200 (2260/1233)TG 99, HDL 65. LDL goal =<120, ideally <100. father MI @ 42  . Hypertension   . Microscopic hematuria   . Degenerative joint disease   . Peripheral vascular disease     ICA bilat, Dr.Charles fields, [facility]  . Carotid artery occlusion   . Heart murmur   . Anxiety   . Peripheral neuropathy     compressive in UE bilaterally; Dr Daylene Katayama  . Aortic insufficiency     mild due to degenerative changes  . Basal cell carcinoma 2007    [place] [facility] Left leg    Past  Surgical History  Procedure Laterality Date  . Abdominal hysterectomy  1973    age 3 due to dysfuctional menses; HRT x 25-30 years  . Bilateral oophorectomy  1990    prophylactically (sister had ovarian ca)  . Appendectomy  1952  . Cholecystectomy  2006  . Tonsillectomy  1957  . Orthopedic surgery  1989/200/2002/2012    elbows,shoulder surgery x 3, right hand  . Carpal tunnel release  1989    right  . Basal cell cancer  03/2006    leg  . Cataract extraction, bilateral  2010    Dr Kathrin Penner  . US echocardiography  05/01/2010    trace MR,AI,TR;EF =>55%  . Nm myoview ltd  06/13/2008    low risk scan  . Eye surgery      Bilateral eye    Family History  Problem Relation Age of Onset  . Heart attack Father 18  . Heart disease Father     Before age 37  . Colon cancer Mother 74  . Stroke Mother 58  . Kidney disease Mother     ? renal calculi; S/P resecton of kidney  . Cancer Mother 17    Colon  . Ovarian cancer Sister   . Other Sister     Valve replacement (aortic & mitral ) in 2 sisters  . Heart disease Sister     Before age 42  . Heart disease Brother     aortic & mitral valve replacement in 2 bro; both had CBAG  . Diabetes Neg Hx     History   Social History  . Marital Status: Widowed    Spouse Name: N/A    Number of Children: N/A  . Years of Education: N/A   Occupational History  . Not on file.   Social History Main Topics  . Smoking status: Never Smoker   . Smokeless tobacco: Never Used  . Alcohol Use: No  . Drug Use: No  . Sexual Activity: Not on file   Other Topics Concern  . Not on file   Social History Narrative   Low cholesterol diet   Regular exercise- yes     Review of systems: The patient specifically denies any chest pain at rest or with exertion, dyspnea at rest, orthopnea, paroxysmal nocturnal dyspnea, syncope, palpitations, focal neurological deficits, intermittent claudication, lower extremity edema, unexplained weight gain, cough,  hemoptysis or wheezing.  The patient also denies abdominal pain,  nausea, vomiting, dysphagia, diarrhea, constipation, polyuria, polydipsia, dysuria, hematuria, frequency, urgency, abnormal bleeding or bruising, fever, chills, unexpected weight changes, mood swings, change in skin or hair texture, change in voice quality, auditory or visual problems, allergic reactions or rashes, new musculoskeletal complaints other than usual "aches and pains".   PHYSICAL EXAM BP 130/80  Pulse 63  Resp 20  Ht 5' 6.5" (1.689 m)  Wt 124 lb (56.246 kg)  BMI 19.72 kg/m2 Supine BP 114/71, pulse 60 Sitting BP 118/74, pulse 62 Standing BP 115/75, pulse 62 Standing after 2 minutes BP 129/73, pulse 62 General: Alert, oriented x3, no distress Head: no evidence of trauma, PERRL, EOMI, no exophtalmos or lid lag, no myxedema, no xanthelasma; normal ears, nose and oropharynx Neck: normal jugular venous pulsations and no hepatojugular reflux; brisk carotid pulses without delay and no carotid bruits Chest: clear to auscultation, no signs of consolidation by percussion or palpation, normal fremitus, symmetrical and full respiratory excursions Cardiovascular: normal position and quality of the apical impulse, regular rhythm, normal first and second heart sounds, no murmurs, rubs or gallops Abdomen: no tenderness or distention, no masses by palpation, no abnormal pulsatility or arterial bruits, normal bowel sounds, no hepatosplenomegaly Extremities: no clubbing, cyanosis or edema; 2+ radial, ulnar and brachial pulses bilaterally; 2+ right femoral, posterior tibial and dorsalis pedis pulses; 2+ left femoral, posterior tibial and dorsalis pedis pulses; no subclavian or femoral bruits Neurological: grossly nonfocal   EKG: Normal sinus rhythm possible left atrial enlargement, no repolarization abnormalities  Lipid Panel     Component Value Date/Time   CHOL 254* 09/08/2013 1023   TRIG 85.0 09/08/2013 1023   HDL 83.50  09/08/2013 1023   CHOLHDL 3 09/08/2013 1023   VLDL 17.0 09/08/2013 1023   LDLCALC 154* 09/08/2013 1023    BMET    Component Value Date/Time   NA 139 12/31/2013 1106   K 4.2 12/31/2013 1106   CL 102 12/31/2013 1106   CO2 27 12/31/2013 1106   GLUCOSE 80 12/31/2013 1106   BUN 23 12/31/2013 1106   CREATININE 1.8* 12/31/2013 1106   CALCIUM 10.3 12/31/2013 1106   GFRNONAA 39* 08/07/2010 1126   GFRAA  Value: 47        The eGFR has been calculated using the MDRD equation. This calculation has not been validated in all clinical situations. eGFR's persistently <60 mL/min signify possible Chronic Kidney Disease.* 08/07/2010 1126     ASSESSMENT AND PLAN Dyspnea on exertion This appears moderately severe and is a striking change from her previous exercise tolerance. By clinical exam there is no overt evidence of congestive heart failure. We'll recheck cholesterol ventricular ejection fraction and evaluate the aortic valve for evidence of progressive insufficiency (although by exam this is also not apparent). In a patient with hypertension and hypercholesterolemia and possible PAD (carotid disease) is important to also screen for coronary insufficiency as a cause of her symptoms. Her dyspnea could be an angina equivalent. Will order a nuclear stress test. She cannot exercise on the treadmill. Her last nuclear stress test was performed in 2009 and was also a vasodilator stress test.  PAF (paroxysmal atrial fibrillation) Her symptoms are exertional, and therefore unlikely to be explained by an arrhythmia. She has only had one episode of atrial fibrillation that we aren't aware of, and this happened around the time of surgery. She is not on full anti-coagulation mostly at her request. Borderline CHADSVasc score if one discounts the carotid disease. She did take Xarelto for a while after  the surgery. Now on aspirin. If the echo and stress test do not show evidence of significant structural heart disease, consider an  event monitor for atrial fibrillation  HYPERLIPIDEMIA She has severe hypercholesterolemia and may have heterozygous familial hypercholesterolemia. She has been intolerant of all statins. He identifying coronary disease, the need for treatment will become much more pressing. Consider PCSK9 inhibitors.  HYPERTENSION, ESSENTIAL NOS She has exertional dizziness and near syncope, often suggestive of a drop in blood pressure. Have asked her to reduce her losartan to 50 mg daily   Orders Placed This Encounter  Procedures  . Myocardial Perfusion Imaging  . EKG 12-Lead  . 2D Echocardiogram without contrast   Meds ordered this encounter  Medications  . losartan (COZAAR) 100 MG tablet    Sig: Take 0.5 tablets (50 mg total) by mouth daily.    Dispense:  90 tablet    Refill:  3    This should be for a year supply for patient    Holli Humbles, MD, [facility] [phone] office [phone] pager

## 2014-03-04 ENCOUNTER — Telehealth (HOSPITAL_COMMUNITY): Payer: Self-pay

## 2014-03-04 NOTE — Telephone Encounter (Signed)
Encounter complete. 

## 2014-03-08 ENCOUNTER — Telehealth (HOSPITAL_COMMUNITY): Payer: Self-pay

## 2014-03-08 NOTE — Telephone Encounter (Signed)
Encounter complete. 

## 2014-03-09 ENCOUNTER — Ambulatory Visit (HOSPITAL_COMMUNITY)
Admission: RE | Admit: 2014-03-09 | Discharge: 2014-03-09 | Disposition: A | Discharge status: Home / Self Care | Payer: Medicare Other | Source: Ambulatory Visit | Attending: Cardiovascular Disease | Admitting: Cardiovascular Disease

## 2014-03-09 DIAGNOSIS — E785 Hyperlipidemia, unspecified: Secondary | ICD-10-CM | POA: Diagnosis not present

## 2014-03-09 DIAGNOSIS — I10 Essential (primary) hypertension: Secondary | ICD-10-CM | POA: Diagnosis not present

## 2014-03-09 DIAGNOSIS — R0602 Shortness of breath: Secondary | ICD-10-CM | POA: Insufficient documentation

## 2014-03-09 DIAGNOSIS — I779 Disorder of arteries and arterioles, unspecified: Secondary | ICD-10-CM | POA: Diagnosis not present

## 2014-03-09 DIAGNOSIS — Z8249 Family history of ischemic heart disease and other diseases of the circulatory system: Secondary | ICD-10-CM | POA: Insufficient documentation

## 2014-03-09 DIAGNOSIS — R0609 Other forms of dyspnea: Secondary | ICD-10-CM | POA: Diagnosis not present

## 2014-03-09 DIAGNOSIS — I369 Nonrheumatic tricuspid valve disorder, unspecified: Secondary | ICD-10-CM

## 2014-03-09 DIAGNOSIS — I4891 Unspecified atrial fibrillation: Secondary | ICD-10-CM | POA: Insufficient documentation

## 2014-03-09 DIAGNOSIS — R0989 Other specified symptoms and signs involving the circulatory and respiratory systems: Secondary | ICD-10-CM | POA: Diagnosis not present

## 2014-03-09 DIAGNOSIS — R0600 Dyspnea, unspecified: Secondary | ICD-10-CM

## 2014-03-09 MED ORDER — REGADENOSON 0.4 MG/5ML IV SOLN
0.4000 mg | Freq: Once | INTRAVENOUS | Status: AC
  Administered 2014-03-09: 0.4 mg via INTRAVENOUS

## 2014-03-09 MED ORDER — TECHNETIUM TC 99M SESTAMIBI GENERIC - CARDIOLITE
30.0000 | Freq: Once | INTRAVENOUS | Status: AC | PRN
  Administered 2014-03-09: 30 via INTRAVENOUS

## 2014-03-09 MED ORDER — TECHNETIUM TC 99M SESTAMIBI GENERIC - CARDIOLITE
10.0000 | Freq: Once | INTRAVENOUS | Status: AC | PRN
  Administered 2014-03-09: 10 via INTRAVENOUS

## 2014-03-09 MED ORDER — AMINOPHYLLINE 25 MG/ML IV SOLN
75.0000 mg | Freq: Once | INTRAVENOUS | Status: AC
  Administered 2014-03-09: 75 mg via INTRAVENOUS

## 2014-03-09 NOTE — Procedures (Addendum)
[facility] [address] [place] [id number]  Cardiology Nuclear Med Study  Deborah Shields is a 73 y.o. female     MRN : [id number]     DOB: [birth year]  Procedure Date: 03/09/2014  Nuclear Med Background Indication for Stress Test:  Evaluation for Ischemia History:  PAF;HEART MURMUR;No prior respiratory history reported;Last NUC MPI on 06/13/2008-nonischemic;EF=79% Cardiac Risk Factors: Carotid Disease, Family History - CAD, Hypertension, Lipids and PVD  Symptoms:  Dizziness, DOE, Fatigue, Light-Headedness, Near Syncope and Palpitations   Nuclear Pre-Procedure Caffeine/Decaff Intake:  9:00pm NPO After: 7:00am   IV Site: R Hand  IV 0.9% NS with Angio Cath:  22g  Chest Size (in):  n/a IV Started by: Rolene Course, RN  Height: 5\' 7"  (1.702 m)  Cup Size: DD  BMI:  Body mass index is 19.42 kg/(m^2). Weight:  124 lb (56.246 kg)   Tech Comments:  n/a    Nuclear Med Study 1 or 2 day study: 1 day  Stress Test Type:  [place] Provider:  Sanda Klein, MD   Resting Radionuclide: Technetium 10m Sestamibi  Resting Radionuclide Dose: 9.8 mCi   Stress Radionuclide:  Technetium 72m Sestamibi  Stress Radionuclide Dose: 30.8 mCi           Stress Protocol Rest HR: 59 Stress HR: 78  Rest BP:141/102 Stress BP:172/94  Exercise Time (min): n/a METS: n/a          Dose of Adenosine (mg):  n/a Dose of Lexiscan: 0.4 mg  Dose of Atropine (mg): n/a Dose of Dobutamine: n/a mcg/kg/min (at max HR)  Stress Test Technologist: Mellody Memos, CCT Nuclear Technologist: Otho Perl, CNMT   Rest Procedure:  Myocardial perfusion imaging was performed at rest 45 minutes following the intravenous administration of Technetium 24m Sestamibi. Stress Procedure:  The patient received IV Lexiscan 0.4 mg over 15-seconds.  Technetium 66m Sestamibi injected IV at 30-seconds.  Patient experienced shortness of breath, chest  tightness, light-headedness and was administered 75 mg of Aminophylline IV at 5 minutes. There were no significant changes with Lexiscan.  Quantitative spect images were obtained after a 45 minute delay.  Transient Ischemic Dilatation (Normal <1.22):  0.99 QGS EDV:  60 ml QGS ESV:  28 ml LV Ejection Fraction: 67%     Rest ECG: NSR - Normal EKG  Stress ECG: No significant change from baseline ECG  QPS Raw Data Images:  Normal; no motion artifact; normal heart/lung ratio. Stress Images:  Normal homogeneous uptake in all areas of the myocardium. Rest Images:  Normal homogeneous uptake in all areas of the myocardium. Subtraction (SDS):  Normal  Impression Exercise Capacity:  Lexiscan with no exercise. BP Response:  Normal blood pressure response. Clinical Symptoms:  Mild chest pain/dyspnea. ECG Impression:  No significant ST segment change suggestive of ischemia. Comparison with Prior Nuclear Study: No significant change from previous study  Overall Impression:  Normal stress nuclear study.  LV Wall Motion:  NL LV Function, EF 67%; NL Wall Motion   Clarence Cogswell A, MD  03/09/2014 12:34 PM

## 2014-03-09 NOTE — Progress Notes (Signed)
2D Echo Performed 03/09/2014    Marygrace Drought, RCS

## 2014-03-23 ENCOUNTER — Encounter: Payer: Self-pay | Admitting: Cardiovascular Disease

## 2014-03-23 ENCOUNTER — Ambulatory Visit (INDEPENDENT_AMBULATORY_CARE_PROVIDER_SITE_OTHER): Payer: Medicare Other | Admitting: Cardiovascular Disease

## 2014-03-23 VITALS — BP 120/84 | HR 62 | Resp 16 | Ht 66.0 in | Wt 122.5 lb

## 2014-03-23 DIAGNOSIS — I4891 Unspecified atrial fibrillation: Secondary | ICD-10-CM | POA: Diagnosis not present

## 2014-03-23 DIAGNOSIS — I6529 Occlusion and stenosis of unspecified carotid artery: Secondary | ICD-10-CM

## 2014-03-23 DIAGNOSIS — I480 Paroxysmal atrial fibrillation: Secondary | ICD-10-CM

## 2014-03-23 DIAGNOSIS — I10 Essential (primary) hypertension: Secondary | ICD-10-CM | POA: Diagnosis not present

## 2014-03-23 NOTE — Patient Instructions (Signed)
Dr. Croitoru recommends that you schedule a follow-up appointment in: One year.   

## 2014-03-24 NOTE — Progress Notes (Signed)
Patient ID: Deborah Shields, female   DOB: 1941/02/21, 73 y.o.   MRN: [id number]      Reason for office visit Followup echo and stress test  Deborah Shields is a 73 year old woman with long-standing treated hypertension and hypercholesterolemia, mild degenerative aortic valve insufficiency and a single episode of perioperative paroxysmal atrial fibrillation (after shoulder surgery (.  She had a history of moderate stenosis in the left internal carotid artery, but no history of stroke or TIA. Ultrasonography performed in October 2014 shows no evidence of stenosis anymore.  Deborah Shields has pull-through numerous personal tragedies. She has been a widow for 20 years. Her husband died at age 25, 2 years after a horse riding accident following 2 years of struggle with complications of brain trauma. That he has also lost 2 grandchildren including family who died at age 16 from [place]. 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  She was also complaining of dizziness and we reduced her dose of losartan to 50 mg daily. She's not sure why, but she is feeling better. Her only complaint is occasional brief palpitations. Multiple adjustments have recently made in her thyroid medication which has been alternatively increased and reduced. She's currently taking 88 mcg alternating with 100 mcg every other day   Allergies  Allergen Reactions  . Penicillins Itching and Swelling    REACTION: itching and edema  . Aspirin Nausea Only    REACTION: GI upset  ( pt can take 81 mg but NOT   325 mg ASA )  . Codeine Nausea And Vomiting    REACTION: GI upset  . Lovastatin Nausea Only    REACTION: nausea  . Benazepril Hcl   . Bupropion Hcl     REACTION: tinnitis  . Ezetimibe     REACTION: GI symptoms  . Fenofibrate    Myalgia   . Paroxetine   . Pravastatin Sodium Other (See Comments)    REACTION: elevated CPK - Muscle's in bilateral Leg    Current Outpatient Prescriptions  Medication Sig Dispense Refill  . amLODipine (NORVASC) 5 MG tablet Take 1 tablet (5 mg total) by mouth daily.  90 tablet  3  . aspirin 81 MG tablet Take 81 mg by mouth daily.        Marland Kitchen b complex vitamins tablet Take 1 tablet by mouth daily.        . Calcium Carbonate-Vitamin D (CALCIUM-VITAMIN D) 600-200 MG-UNIT CAPS Take by mouth.        . Cholecalciferol (VITAMIN D) 1000 UNITS capsule Take 1,000 Units by mouth daily.        . clobetasol cream (TEMOVATE) 9.14 % Apply 1 application topically as needed.       . cycloSPORINE (RESTASIS) 0.05 % ophthalmic emulsion 1 drop 2 (two) times daily.        Marland Kitchen levothyroxine (SYNTHROID) 88 MCG tablet Take 100 mcg by mouth daily. Taking the brand "synthroid"  Alternates 6mg with 108m every other day      . LORazepam (ATIVAN) 0.5 MG tablet Take 1 tablet (0.5 mg total) by mouth 3 (three) times daily as needed.  90 tablet  0  . losartan (COZAAR) 100 MG tablet Take 0.5 tablets (50 mg total) by mouth daily.  90 tablet  3  . meclizine (ANTIVERT) 25 MG tablet Take 1 tablet (  25 mg total) by mouth daily. 1/2 every 8 hrs prn for imbalance  30 tablet  2  . metoprolol (LOPRESSOR) 50 MG tablet 1/2 by mouth two times daily  90 tablet  3  . Multiple Vitamins-Minerals (CENTRUM SILVER PO) Take by mouth.        . traMADol (ULTRAM) 50 MG tablet 1/2-1 by mouth once daily as needed.  90 tablet  0  . vitamin E 100 UNIT capsule Take 100 Units by mouth daily.        No current facility-administered medications for this visit.    Past Medical History  Diagnosis Date  . Diverticulosis of colon   . Hypothyroidism     Dr Wilson Singer  . Hyperlipidemia     NMR 07/2009: LDL 200 (2260/1233)TG 99, HDL 65. LDL goal =<120, ideally <100. father MI @ 13  . Hypertension   . Microscopic hematuria   . Degenerative joint disease     . Peripheral vascular disease     ICA bilat, Dr.Charles fields, [facility]  . Carotid artery occlusion   . Heart murmur   . Anxiety   . Peripheral neuropathy     compressive in UE bilaterally; Dr Daylene Katayama  . Aortic insufficiency     mild due to degenerative changes  . Basal cell carcinoma 2007    [place] [facility] Left leg    Past Surgical History  Procedure Laterality Date  . Abdominal hysterectomy  1973    age 56 due to dysfuctional menses; HRT x 25-30 years  . Bilateral oophorectomy  1990    prophylactically (sister had ovarian ca)  . Appendectomy  1952  . Cholecystectomy  2006  . Tonsillectomy  1957  . Orthopedic surgery  1989/200/2002/2012    elbows,shoulder surgery x 3, right hand  . Carpal tunnel release  1989    right  . Basal cell cancer  03/2006    leg  . Cataract extraction, bilateral  2010    Dr Kathrin Penner  . US echocardiography  05/01/2010    trace MR,AI,TR;EF =>55%  . Nm myoview ltd  06/13/2008    low risk scan  . Eye surgery      Bilateral eye    Family History  Problem Relation Age of Onset  . Heart attack Father 28  . Heart disease Father     Before age 42  . Colon cancer Mother 41  . Stroke Mother 67  . Kidney disease Mother     ? renal calculi; S/P resecton of kidney  . Cancer Mother 18    Colon  . Ovarian cancer Sister   . Other Sister     Valve replacement (aortic & mitral ) in 2 sisters  . Heart disease Sister     Before age 25  . Heart disease Brother     aortic & mitral valve replacement in 2 bro; both had CBAG  . Diabetes Neg Hx     History   Social History  . Marital Status: Widowed    Spouse Name: N/A    Number of Children: N/A  . Years of Education: N/A   Occupational History  . Not on file.   Social History Main Topics  . Smoking status: Never Smoker   . Smokeless tobacco: Never Used  . Alcohol Use: No  . Drug Use: No  . Sexual Activity: Not on file   Other Topics Concern  . Not on file   Social History Narrative   Low  cholesterol diet  Regular exercise- yes     Review of systems: The patient specifically denies any chest pain at rest or with exertion, dyspnea at rest, orthopnea, paroxysmal nocturnal dyspnea, syncope, palpitations, focal neurological deficits, intermittent claudication, lower extremity edema, unexplained weight gain, cough, hemoptysis or wheezing.  The patient also denies abdominal pain, nausea, vomiting, dysphagia, diarrhea, constipation, polyuria, polydipsia, dysuria, hematuria, frequency, urgency, abnormal bleeding or bruising, fever, chills, unexpected weight changes, mood swings, change in skin or hair texture, change in voice quality, auditory or visual problems, allergic reactions or rashes, new musculoskeletal complaints other than usual "aches and pains".   PHYSICAL EXAM BP 120/84  Pulse 62  Ht 5' 6"  (1.676 m)  Wt 55.566 kg (122 lb 8 oz)  BMI 19.78 kg/m2 General: Alert, oriented x3, no distress  Head: no evidence of trauma, PERRL, EOMI, no exophtalmos or lid lag, no myxedema, no xanthelasma; normal ears, nose and oropharynx  Neck: normal jugular venous pulsations and no hepatojugular reflux; brisk carotid pulses without delay and no carotid bruits  Chest: clear to auscultation, no signs of consolidation by percussion or palpation, normal fremitus, symmetrical and full respiratory excursions  Cardiovascular: normal position and quality of the apical impulse, regular rhythm, normal first and second heart sounds, no murmurs, rubs or gallops  Abdomen: no tenderness or distention, no masses by palpation, no abnormal pulsatility or arterial bruits, normal bowel sounds, no hepatosplenomegaly  Extremities: no clubbing, cyanosis or edema; 2+ radial, ulnar and brachial pulses bilaterally; 2+ right femoral, posterior tibial and dorsalis pedis pulses; 2+ left femoral, posterior tibial and dorsalis pedis pulses; no subclavian or femoral bruits  Neurological: grossly nonfocal   Lipid Panel      Component Value Date/Time   CHOL 254* 09/08/2013 1023   TRIG 85.0 09/08/2013 1023   HDL 83.50 09/08/2013 1023   CHOLHDL 3 09/08/2013 1023   VLDL 17.0 09/08/2013 1023   LDLCALC 154* 09/08/2013 1023   LDLDIRECT 169.9 01/16/2011 0854   LDLDIRECT 133.1 05/21/2006 1321    BMET    Component Value Date/Time   NA 139 12/31/2013 1106   K 4.2 12/31/2013 1106   CL 102 12/31/2013 1106   CO2 27 12/31/2013 1106   GLUCOSE 80 12/31/2013 1106   BUN 23 12/31/2013 1106   CREATININE 1.8* 12/31/2013 1106   CALCIUM 10.3 12/31/2013 1106   GFRNONAA 39* 08/07/2010 1126   GFRAA  Value: 47        The eGFR has been calculated using the MDRD equation. This calculation has not been validated in all clinical situations. eGFR's persistently <60 mL/min signify possible Chronic Kidney Disease.* 08/07/2010 1126     ASSESSMENT AND PLAN Dyspnea on exertion  Normal echo and nuclear perfusion studies. No evidence of a cardiac source of dyspnea. He seems to have improved with adjustments in her dose of thyroid medication. PAF (paroxysmal atrial fibrillation)  No underlying major structural abnormality. She only had one brief episode of atrial fibrillation after shoulder surgery. She is on aspirin for stroke prevention. No documentation of recent atrial fibrillation. Her palpitations are brief and may have been related to the changes in her thyroid medication. Chadsvasc score is relatively low (female gender, age, hypertension).  HYPERLIPIDEMIA  Consider PCSK9 inhibitors, but these are not currently approved for statin intolerant patients such as Deborah Shields. Since she does not have significant vascular disease she would not qualify for clinical trial. HYPERTENSION, ESSENTIAL NOS  Her dizzy spells are better after we reduced her losartan to 50 mg daily  Deborah Shields  Sanda Klein, MD, [facility] [phone] office [phone] pager

## 2014-04-11 DIAGNOSIS — E0789 Other specified disorders of thyroid: Secondary | ICD-10-CM | POA: Diagnosis not present

## 2014-04-18 DIAGNOSIS — M4722 Other spondylosis with radiculopathy, cervical region: Secondary | ICD-10-CM | POA: Diagnosis not present

## 2014-04-18 DIAGNOSIS — M47816 Spondylosis without myelopathy or radiculopathy, lumbar region: Secondary | ICD-10-CM | POA: Diagnosis not present

## 2014-04-18 DIAGNOSIS — M4806 Spinal stenosis, lumbar region: Secondary | ICD-10-CM | POA: Diagnosis not present

## 2014-04-18 DIAGNOSIS — M545 Low back pain: Secondary | ICD-10-CM | POA: Diagnosis not present

## 2014-04-20 DIAGNOSIS — E039 Hypothyroidism, unspecified: Secondary | ICD-10-CM | POA: Diagnosis not present

## 2014-04-20 DIAGNOSIS — M1990 Unspecified osteoarthritis, unspecified site: Secondary | ICD-10-CM | POA: Diagnosis not present

## 2014-04-20 DIAGNOSIS — G4700 Insomnia, unspecified: Secondary | ICD-10-CM | POA: Diagnosis not present

## 2014-04-23 DIAGNOSIS — M4802 Spinal stenosis, cervical region: Secondary | ICD-10-CM | POA: Diagnosis not present

## 2014-04-23 DIAGNOSIS — M4722 Other spondylosis with radiculopathy, cervical region: Secondary | ICD-10-CM | POA: Diagnosis not present

## 2014-04-23 DIAGNOSIS — M4806 Spinal stenosis, lumbar region: Secondary | ICD-10-CM | POA: Diagnosis not present

## 2014-04-29 ENCOUNTER — Other Ambulatory Visit: Payer: Self-pay

## 2014-04-29 DIAGNOSIS — Z1231 Encounter for screening mammogram for malignant neoplasm of breast: Secondary | ICD-10-CM

## 2014-05-10 ENCOUNTER — Other Ambulatory Visit: Payer: Self-pay | Admitting: Internal Medicine

## 2014-05-23 ENCOUNTER — Other Ambulatory Visit: Payer: Self-pay | Admitting: Internal Medicine

## 2014-05-26 DIAGNOSIS — M47816 Spondylosis without myelopathy or radiculopathy, lumbar region: Secondary | ICD-10-CM | POA: Diagnosis not present

## 2014-06-02 ENCOUNTER — Encounter (HOSPITAL_COMMUNITY): Payer: Self-pay | Admitting: *Deleted

## 2014-06-02 ENCOUNTER — Inpatient Hospital Stay (HOSPITAL_COMMUNITY)
Admission: AD | Admit: 2014-06-02 | Discharge: 2014-06-02 | Disposition: A | Discharge status: Home / Self Care | Payer: Medicare Other | Source: Ambulatory Visit | Attending: Obstetrics & Gynecology | Admitting: Obstetrics & Gynecology

## 2014-06-02 DIAGNOSIS — N949 Unspecified condition associated with female genital organs and menstrual cycle: Secondary | ICD-10-CM | POA: Diagnosis not present

## 2014-06-02 DIAGNOSIS — G629 Polyneuropathy, unspecified: Secondary | ICD-10-CM | POA: Diagnosis not present

## 2014-06-02 DIAGNOSIS — I739 Peripheral vascular disease, unspecified: Secondary | ICD-10-CM | POA: Diagnosis not present

## 2014-06-02 DIAGNOSIS — I10 Essential (primary) hypertension: Secondary | ICD-10-CM | POA: Insufficient documentation

## 2014-06-02 DIAGNOSIS — N8110 Cystocele, unspecified: Secondary | ICD-10-CM

## 2014-06-02 DIAGNOSIS — IMO0002 Reserved for concepts with insufficient information to code with codable children: Secondary | ICD-10-CM

## 2014-06-02 LAB — URINALYSIS, ROUTINE W REFLEX MICROSCOPIC
Bilirubin Urine: NEGATIVE
Glucose, UA: NEGATIVE mg/dL
Ketones, ur: NEGATIVE mg/dL
Leukocytes, UA: NEGATIVE
Nitrite: NEGATIVE
Protein, ur: NEGATIVE mg/dL
Specific Gravity, Urine: 1.015 (ref 1.005–1.030)
Urobilinogen, UA: 0.2 mg/dL (ref 0.0–1.0)
pH: 6 (ref 5.0–8.0)

## 2014-06-02 LAB — URINE MICROSCOPIC-ADD ON

## 2014-06-02 NOTE — MAU Provider Note (Signed)
History     CSN: [id number]  Arrival date and time: 06/02/14 1506   First Provider Initiated Contact with Patient 06/02/14 1540      Chief Complaint  Patient presents with  . Vaginal Pain   HPI  Deborah Shields is a 73 y.o. a [id number] who presents today with swelling in her vagina. She states that she noticed it last night. She states that it is not painful, but she has had some "menstrual cramps".  She has been getting regular pelvic exams Audrea Muscat Prevalt-Gilbert at [facility]. She called her office, and was told that she couldn't get an appointment until next week. She states that she is not sexually active, and has no concerns for STD.   Past Medical History  Diagnosis Date  . Diverticulosis of colon   . Hypothyroidism     Dr Wilson Singer  . Hyperlipidemia     NMR 07/2009: LDL 200 (2260/1233)TG 99, HDL 65. LDL goal =<120, ideally <100. father MI @ 15  . Hypertension   . Microscopic hematuria   . Degenerative joint disease   . Peripheral vascular disease     ICA bilat, Dr.Charles fields, [facility]  . Carotid artery occlusion   . Heart murmur   . Anxiety   . Peripheral neuropathy     compressive in UE bilaterally; Dr Daylene Katayama  . Aortic insufficiency     mild due to degenerative changes  . Basal cell carcinoma 2007    [facility] Left leg    Past Surgical History  Procedure Laterality Date  . Abdominal hysterectomy  1973    age 26 due to dysfuctional menses; HRT x 25-30 years  . Bilateral oophorectomy  1990    prophylactically (sister had ovarian ca)  . Appendectomy  1952  . Cholecystectomy  2006  . Tonsillectomy  1957  . Orthopedic surgery  1989/200/2002/2012    elbows,shoulder surgery x 3, right hand  . Carpal tunnel release  1989    right  . Basal cell cancer  03/2006    leg  . Cataract extraction, bilateral  2010    Dr Kathrin Penner  . US echocardiography  05/01/2010    trace MR,AI,TR;EF =>55%  . Nm myoview ltd  06/13/2008    low risk scan  . Eye surgery      Bilateral eye    Family History  Problem Relation Age of Onset  . Heart attack Father 77  . Heart disease Father     Before age 73  . Colon cancer Mother 44  . Stroke Mother 6  . Kidney disease Mother     ? renal calculi; S/P resecton of kidney  . Cancer Mother 58    Colon  . Ovarian cancer Sister   . Other Sister     Valve replacement (aortic & mitral ) in 2 sisters  . Heart disease Sister     Before age 40  . Heart disease Brother     aortic & mitral valve replacement in 2 bro; both had CBAG  . Diabetes Neg Hx     History  Substance Use Topics  . Smoking status: Never Smoker   . Smokeless tobacco: Never Used  . Alcohol Use: No    Allergies:  Allergies  Allergen Reactions  . Penicillins Itching and Swelling    REACTION: itching and edema  . Aspirin Nausea Only    REACTION: GI upset  ( pt can take 81 mg but NOT   325 mg ASA )  .  Codeine Nausea And Vomiting    REACTION: GI upset  . Lovastatin Nausea Only    REACTION: nausea  . Benazepril Hcl   . Bupropion Hcl     REACTION: tinnitis  . Ezetimibe     REACTION: GI symptoms  . Fenofibrate     Myalgia   . Paroxetine   . Pravastatin Sodium Other (See Comments)    REACTION: elevated CPK - Muscle's in bilateral Leg    Prescriptions prior to admission  Medication Sig Dispense Refill Last Dose  . amLODipine (NORVASC) 5 MG tablet Take 1 tablet by mouth  daily 90 tablet 1   . aspirin 81 MG tablet Take 81 mg by mouth daily.     Taking  . b complex vitamins tablet Take 1 tablet by mouth daily.     Taking  . Calcium Carbonate-Vitamin D (CALCIUM-VITAMIN D) 600-200 MG-UNIT CAPS Take by mouth.     Taking  . Cholecalciferol (VITAMIN D) 1000 UNITS capsule Take 1,000 Units by mouth daily.     Taking  . clobetasol cream (TEMOVATE) [id number] % Apply 1 application topically as needed.    Taking  . cycloSPORINE (RESTASIS) 0.05 % ophthalmic emulsion 1 drop 2 (two) times daily.     Taking  . levothyroxine (SYNTHROID) 88 MCG tablet  Take 100 mcg by mouth daily. Taking the brand "synthroid"  Alternates 42mcg with 143mcg every other day   Taking  . LORazepam (ATIVAN) 0.5 MG tablet Take 1 tablet (0.5 mg total) by mouth 3 (three) times daily as needed. 90 tablet 0 Taking  . losartan (COZAAR) 100 MG tablet Take 0.5 tablets (50 mg total) by mouth daily. 90 tablet 3 Taking  . meclizine (ANTIVERT) 25 MG tablet Take 1 tablet (25 mg total) by mouth daily. 1/2 every 8 hrs prn for imbalance 30 tablet 2 Taking  . metoprolol (LOPRESSOR) 50 MG tablet Take one-half tablet by  mouth twice a day 90 tablet 1   . Multiple Vitamins-Minerals (CENTRUM SILVER PO) Take by mouth.     Taking  . traMADol (ULTRAM) 50 MG tablet 1/2-1 by mouth once daily as needed. 90 tablet 0 Taking  . vitamin E 100 UNIT capsule Take 100 Units by mouth daily.    Taking    ROS Physical Exam   There were no vitals taken for this visit.  Physical Exam  Nursing note and vitals reviewed. Constitutional: She is oriented to person, place, and time. She appears well-developed. No distress.  Cardiovascular: Normal rate.   Respiratory: Effort normal.  GI: Soft. There is no tenderness. There is no rebound.  Genitourinary:   External: no lesion Vagina: atrophied tissues, Grade I cystocele Cervix: pink, smooth, no CMT    Neurological: She is alert and oriented to person, place, and time.  Skin: Skin is warm and dry.  Psychiatric: She has a normal mood and affect.    MAU Course  Procedures    Assessment and Plan   1. Cystocele    Kegal exercises reviewed FU with PCP, consider pessary PRN  Follow-up Information    Follow up with PREVOST-GILBERT,MARY, FNP In 1 month.   Specialty:  Internal Medicine   Contact information:   [address] [place] [address] [phone]        Mathis Bud 06/02/2014, 3:43 PM

## 2014-06-02 NOTE — MAU Note (Signed)
Pt states she feels like she has menstrual cramps

## 2014-06-02 NOTE — MAU Note (Signed)
Pt states she had a probem that has been holding her vagina open. Pt states has had something in her vagina that reminded her of a "rick rack ball"

## 2014-06-02 NOTE — MAU Note (Signed)
Urine in lab 

## 2014-06-02 NOTE — Discharge Instructions (Signed)
Anterior prolapse (cystocele)  By [facility] Staff Anterior prolapse, also known as a cystocele (SIS-toe-seel), occurs when the supportive tissue between a woman's bladder and vaginal wall weakens and stretches, allowing the bladder to bulge into the vagina. Anterior prolapse is also called a prolapsed bladder.  Straining the muscles that support your pelvic organs may lead to anterior prolapse. Such straining occurs during vaginal childbirth or with chronic constipation, violent coughing or heavy lifting. Anterior prolapse also tends to cause problems after menopause, when estrogen levels decrease.  For a mild or moderate anterior prolapse, nonsurgical treatment is often effective. In more severe cases, surgery may be necessary to keep the vagina and other pelvic organs in their proper positions.  In mild cases of anterior prolapse, you may not notice any signs or symptoms. When signs and symptoms occur, they may include:  A feeling of fullness or pressure in your pelvis and vagina Increased discomfort when you strain, cough, bear down or lift A feeling that you haven't completely emptied your bladder after urinating Repeated bladder infections Pain or urinary leakage during sexual intercourse In severe cases, a bulge of tissue that protrudes through your vaginal opening and may feel like sitting on an egg Signs and symptoms often are especially noticeable after standing for long periods of time and may go away when you lie down.  When to see a doctor  A severely prolapsed bladder can be uncomfortable. It can make emptying your bladder difficult and may lead to bladder infections. Make an appointment with your doctor if you have any signs or symptoms that bother you.  Your pelvic floor consists of muscles, ligaments and connective tissues that support your bladder and other pelvic organs. The connections between your pelvic floor muscles and ligaments can weaken over time, as a result of  trauma from childbirth or chronic straining of pelvic floor muscles. When this happens, your bladder can slip down lower than normal and bulge into your vagina (anterior prolapse).  Possible causes of anterior prolapse include:  Pregnancy and vaginal childbirth Being overweight or obese Repeated heavy lifting Straining with bowel movements A chronic cough or bronchitis These factors may increase your risk of anterior prolapse:  Childbirth. Women who have vaginally delivered one or more children have a higher risk of anterior prolapse. Aging. Your risk of anterior prolapse increases as you age. This is especially true after menopause, when your body's production of estrogen -- which helps keep the pelvic floor strong -- decreases. Hysterectomy. Having your uterus removed may contribute to weakness in your pelvic floor support. Genetics. Some women are born with weaker connective tissues, making them more susceptible to anterior prolapse. Obesity. Women who are overweight or obese are at higher risk of anterior prolapse. Make an appointment with your family doctor or gynecologist if you have signs or symptoms of anterior prolapse that bother you or interfere with your normal activities.  Here's some information to help you prepare for your appointment and know what to expect from your doctor.  What you can do  Write down any symptoms you've had, and for how long. Make note of key medical information, including other conditions for which you're being treated and the names of medications, vitamins or supplements you regularly take. Bring a friend or relative along, if possible. Having someone else there may help you remember important information or provide details on something that you missed during the appointment. Write down questions to ask your doctor, listing the most important ones first in  case time runs short. For anterior prolapse, some basic questions to ask your doctor  include:  What is the most likely cause of my symptoms? Are there any other possible causes? Do I need any tests to confirm the diagnosis? What treatment approach do you recommend? If the first treatment doesn't work, what will you recommend next? Am I at risk of complications from this condition? What's the likelihood that the anterior prolapse will recur after treatment? Should I follow any activity restrictions? What can I do at home to ease my symptoms? Should I see a specialist? Besides the questions you prepare in advance, don't hesitate to ask other questions during your appointment if you need clarification.  What to expect from your doctor  During your appointment, your doctor may ask a number of questions, such as:  When did you first notice your symptoms? Do you have urine leakage (urinary incontinence)? Do you have frequent bladder infections? Do you have pain or leak urine during intercourse? Do you have a chronic or severe cough? Do you experience constipation and straining during bowel movements? What, if anything, seems to improve your symptoms? What, if anything, seems to worsen your symptoms? Does your mother or a sister have any pelvic floor problems? Have you delivered a baby vaginally? How many times? Do you wish to have children in the future? What else concerns you? Diagnosis of anterior prolapse may involve:  A pelvic exam. You may be examined while lying down and while standing up. During the exam, your doctor looks for a tissue bulge into your vagina that indicates pelvic organ prolapse. You'll likely be asked to bear down as if during a bowel movement to see how much that affects the degree of prolapse. To check the strength of your pelvic floor muscles, you'll be asked to contract them, as if you're trying to stop the stream of urine. Filling out a questionnaire. You may fill out a form that helps your doctor assess the degree of your prolapse and how much  it affects your quality of life. Information gathered also helps guide treatment decisions. Bladder and urine tests. If you have significant prolapse, you might be tested to see how well and completely your bladder empties. Your doctor might also run a test on a urine sample to look for signs of a bladder infection, if it seems that you're retaining more urine in your bladder than is normal after urinating. Treatment depends on how severe your anterior prolapse is and whether you have any related conditions, such as a uterus that slips into the vaginal canal (uterine prolapse).  Mild cases -- those with few or no obvious symptoms -- typically don't require treatment. You could opt for a wait-and-see approach, with occasional visits to your doctor to see if your prolapse is worsening, along with self-care measures, such as exercises that strengthen your pelvic floor muscles.  If self-care measures aren't effective, anterior prolapse treatment might involve:  A supportive device (pessary). A vaginal pessary is a plastic or rubber ring inserted into your vagina to support the bladder. Your doctor or other care provider fits you for the device and shows you how to clean and reinsert it on your own. Many women use pessaries as a temporary alternative to surgery, and some use them when surgery is too risky. Estrogen therapy. Your doctor may recommend using estrogen -- usually a vaginal cream, pill or ring -- especially if you've already experienced menopause. This is because estrogen, which helps keep pelvic  muscles strong, decreases after menopause. When surgery is necessary  If you have noticeable, uncomfortable symptoms, anterior prolapse may require surgery.  How it's done. Often, the surgery is performed vaginally and involves lifting the prolapsed bladder back into place, removing extra tissue, and tightening the muscles and ligaments of the pelvic floor. Your doctor may use a special type of tissue  graft to reinforce vaginal tissues and increase support if your vaginal tissues seem very thin. If you have a prolapsed uterus. For anterior prolapse associated with a prolapsed uterus, your doctor may recommend removing the uterus (hysterectomy) in addition to repairing the damaged pelvic floor muscles, ligaments and other tissues. If you're thinking about becoming pregnant, your doctor may recommend that you delay surgery until after you're done having children. Using a pessary may help relieve your symptoms in the meantime. The benefits of surgery can last for many years, but there's some risk of recurrence -- which may mean another surgery at some point.  Dealing with incontinence  If your anterior prolapse is accompanied by stress incontinence -- involuntary loss of urine during strenuous activity -- your doctor may recommend one of a number of procedures to support the urethra (urethral suspension) and ease your incontinence symptoms.  Kegel exercises strengthen your pelvic floor muscles, which support the uterus, bladder and bowel. A strengthened pelvic floor provides better support for your pelvic organs and relief from symptoms associated with anterior prolapse.  To perform Kegel exercises, follow these steps:  Tighten (contract) your pelvic floor muscles -- the muscles you use to stop urinating. Hold the contraction for five seconds, then relax for five seconds. (If this is too difficult, start by holding for two seconds and relaxing for three seconds.) Work up to holding the contraction for 10 seconds at a time. Do three sets of 10 repetitions of the exercises each day. Ask your health care provider for feedback on whether you're using the right muscles. Kegel exercises may be most successful when they're taught by a physical therapist and reinforced with biofeedback. Biofeedback involves using monitoring devices that help ensure you're tightening the proper muscles with optimal intensity  and length of time.  Once you've learned the proper method, you can do Kegel exercises discreetly just about anytime, whether you're sitting at your desk or relaxing on the couch.  To reduce your risk of developing anterior prolapse, try these self-care measures:  Perform Kegel exercises on a regular basis. These exercises can strengthen your pelvic floor muscles, and this is especially important after you have a baby. Treat and prevent constipation. High-fiber foods can help. Avoid heavy lifting, and lift correctly. When lifting, use your legs instead of your waist or back. Control coughing. Get treatment for a chronic cough or bronchitis, and don't smoke. Avoid weight gain. Talk to your doctor to determine your ideal weight and get advice on weight-loss strategies, if you need them. References  Papadakis MA, et al. Current Medical Diagnosis & Treatment 2014. 53rd ed. [place] [employer]; 2014. http://accessmedicine.PopularFlicks.co.nz.aspx?bookId=330. Accessed January 20, 2013. Myrene Buddy, et al. An overview of the epidemiology, risk factors, clinical manifestations, and management of pelvic organ prolapse in women. [place].gl. Accessed January 20, 2013. [place]. Pelvic organ prolapse in women: Surgical repair of anterior vaginal wall prolapse. [place]rey[place].gl. Accessed January 20, 2013. Urinary incontinence in women. National Kidney and Urologic Diseases Information Clearinghouse. http://kidney.BikingRewards.com.cy. Accessed January 20, 2013. Valentino Saxon, et al. Conservative prevention and management of pelvic organ prolapse in women. Cochrane Database  of Systematic Reviews. http://onlinelibrary.wiley.com/doi/10.1002/14651858.ML:926614.pub4/abstract. Accessed January 20, 2013. Alden Hipp, et al. [facility] Urology. 10th ed. [place], [place] [employer]; 2012. http://www.clinicalkey.com. Accessed January 07, 2013.. Bladder prolapse  (cystocele). Urology Care Foundation. MClerk.tn.cfm?article=118. Accessed January 20, 2013. Freddy Jaksch. Decision Support System. [facility], [place], [place]. Nov 19, 2012. Nat Christen, et al. Comprehensive Gynecology. 6th ed. [place], [place] [employer]; 2012. https://www.bates-morgan.biz/. Accessed December 24, 2012. Ferri FF. Ferri's Clinical Advisor 2015: 5 Books in 1. [place] [employer]; 2015. https://www.clinicalkey.com. Accessed January 17, 2013. Sept. 27, 2014 Original article: http://delgado-williams.com/  .[facility] Legal Conditions and Terms Any use of this site constitutes your agreement to the Terms and Conditions and Privacy Policy linked below.  Terms and Conditions Privacy Policy Notice of Privacy Practices [facility] is a not-for-profit organization and proceeds from [employer] help support our mission. [facility] does not endorse any of the third party products and services advertised.  Advertising and sponsorship policy Advertising and sponsorship opportunities Reprint Permissions A single copy of these materials may be reprinted for noncommercial personal use only. "Mayo," "[facility]," "ConventionUpdate.co.nz," "[place]," and the triple-shield [facility] logo are trademarks of [employer].   [id number] [employer]. All rights reserved.

## 2014-06-06 ENCOUNTER — Ambulatory Visit
Admission: RE | Admit: 2014-06-06 | Discharge: 2014-06-06 | Disposition: A | Discharge status: Home / Self Care | Payer: Medicare Other | Source: Ambulatory Visit

## 2014-06-06 DIAGNOSIS — Z1231 Encounter for screening mammogram for malignant neoplasm of breast: Secondary | ICD-10-CM

## 2014-06-21 DIAGNOSIS — N819 Female genital prolapse, unspecified: Secondary | ICD-10-CM | POA: Diagnosis not present

## 2014-06-21 DIAGNOSIS — Z01419 Encounter for gynecological examination (general) (routine) without abnormal findings: Secondary | ICD-10-CM | POA: Diagnosis not present

## 2014-06-21 DIAGNOSIS — N952 Postmenopausal atrophic vaginitis: Secondary | ICD-10-CM | POA: Diagnosis not present

## 2014-06-21 DIAGNOSIS — E0789 Other specified disorders of thyroid: Secondary | ICD-10-CM | POA: Diagnosis not present

## 2014-06-24 HISTORY — PX: RECTOCELE REPAIR: SHX761

## 2014-06-28 DIAGNOSIS — M549 Dorsalgia, unspecified: Secondary | ICD-10-CM | POA: Diagnosis not present

## 2014-06-28 DIAGNOSIS — E039 Hypothyroidism, unspecified: Secondary | ICD-10-CM | POA: Diagnosis not present

## 2014-07-26 DIAGNOSIS — N760 Acute vaginitis: Secondary | ICD-10-CM | POA: Diagnosis not present

## 2014-07-26 DIAGNOSIS — N8189 Other female genital prolapse: Secondary | ICD-10-CM | POA: Diagnosis not present

## 2014-08-04 ENCOUNTER — Other Ambulatory Visit (INDEPENDENT_AMBULATORY_CARE_PROVIDER_SITE_OTHER): Payer: Medicare Other

## 2014-08-04 ENCOUNTER — Other Ambulatory Visit: Payer: Self-pay

## 2014-08-04 ENCOUNTER — Ambulatory Visit (INDEPENDENT_AMBULATORY_CARE_PROVIDER_SITE_OTHER): Payer: Medicare Other | Admitting: Internal Medicine

## 2014-08-04 ENCOUNTER — Encounter: Payer: Self-pay | Admitting: Internal Medicine

## 2014-08-04 VITALS — BP 118/68 | HR 70 | Temp 97.5°F | Ht 65.5 in | Wt 123.0 lb

## 2014-08-04 DIAGNOSIS — E038 Other specified hypothyroidism: Secondary | ICD-10-CM | POA: Diagnosis not present

## 2014-08-04 DIAGNOSIS — K5730 Diverticulosis of large intestine without perforation or abscess without bleeding: Secondary | ICD-10-CM | POA: Diagnosis not present

## 2014-08-04 DIAGNOSIS — I10 Essential (primary) hypertension: Secondary | ICD-10-CM | POA: Diagnosis not present

## 2014-08-04 DIAGNOSIS — N289 Disorder of kidney and ureter, unspecified: Secondary | ICD-10-CM

## 2014-08-04 DIAGNOSIS — E785 Hyperlipidemia, unspecified: Secondary | ICD-10-CM

## 2014-08-04 DIAGNOSIS — N816 Rectocele: Secondary | ICD-10-CM

## 2014-08-04 LAB — BASIC METABOLIC PANEL
BUN: 21 mg/dL (ref 6–23)
CO2: 31 mEq/L (ref 19–32)
Calcium: 10.2 mg/dL (ref 8.4–10.5)
Chloride: 102 mEq/L (ref 96–112)
Creatinine, Ser: 1.36 mg/dL — ABNORMAL HIGH (ref 0.40–1.20)
GFR: 40.2 mL/min — ABNORMAL LOW (ref >60.00)
Glucose, Bld: 93 mg/dL (ref 70–99)
Potassium: 4.7 mEq/L (ref 3.5–5.1)
Sodium: 138 mEq/L (ref 135–145)

## 2014-08-04 LAB — CBC WITH DIFFERENTIAL/PLATELET
Basophils Absolute: 0.1 10*3/uL (ref 0.0–0.1)
Basophils Relative: 0.8 % (ref 0.0–3.0)
Eosinophils Absolute: 0.2 10*3/uL (ref 0.0–0.7)
Eosinophils Relative: 2.6 % (ref 0.0–5.0)
HCT: 40.9 % (ref 36.0–46.0)
Hemoglobin: 13.9 g/dL (ref 12.0–15.0)
Lymphocytes Relative: 22.2 % (ref 12.0–46.0)
Lymphs Abs: 1.7 10*3/uL (ref 0.7–4.0)
MCHC: 33.9 g/dL (ref 30.0–36.0)
MCV: 89.1 fl (ref 78.0–100.0)
Monocytes Absolute: 0.7 10*3/uL (ref 0.1–1.0)
Monocytes Relative: 8.8 % (ref 3.0–12.0)
Neutro Abs: 5 10*3/uL (ref 1.4–7.7)
Neutrophils Relative %: 65.6 % (ref 43.0–77.0)
Platelets: 266 10*3/uL (ref 150.0–400.0)
RBC: 4.59 Mil/uL (ref 3.87–5.11)
RDW: 14.1 % (ref 11.5–15.5)
WBC: 7.6 10*3/uL (ref 4.0–10.5)

## 2014-08-04 LAB — LIPID PANEL
Cholesterol: 239 mg/dL — ABNORMAL HIGH (ref 0–200)
HDL: 57.7 mg/dL (ref >39.00)
LDL Cholesterol: 154 mg/dL — ABNORMAL HIGH (ref 0–99)
NonHDL: 181.3
Total CHOL/HDL Ratio: 4
Triglycerides: 135 mg/dL (ref 0.0–149.0)
VLDL: 27 mg/dL (ref 0.0–40.0)

## 2014-08-04 LAB — HEPATIC FUNCTION PANEL
ALT: 18 U/L (ref 0–35)
AST: 28 U/L (ref 0–37)
Albumin: 3.9 g/dL (ref 3.5–5.2)
Alkaline Phosphatase: 84 U/L (ref 39–117)
Bilirubin, Direct: 0.1 mg/dL (ref 0.0–0.3)
Total Bilirubin: 0.6 mg/dL (ref 0.2–1.2)
Total Protein: 6.7 g/dL (ref 6.0–8.3)

## 2014-08-04 MED ORDER — LOSARTAN POTASSIUM 100 MG PO TABS: 50.0000 mg | ORAL_TABLET | Freq: Every day | ORAL | Status: DC

## 2014-08-04 MED ORDER — TRAMADOL HCL 50 MG PO TABS: ORAL_TABLET | ORAL | Status: DC

## 2014-08-04 MED ORDER — METOPROLOL TARTRATE 50 MG PO TABS: ORAL_TABLET | ORAL | Status: DC

## 2014-08-04 MED ORDER — AMLODIPINE BESYLATE 5 MG PO TABS: ORAL_TABLET | ORAL | Status: DC

## 2014-08-04 NOTE — Assessment & Plan Note (Signed)
TSH therapeutic

## 2014-08-04 NOTE — Assessment & Plan Note (Signed)
Blood pressure goals reviewed. BMET 

## 2014-08-04 NOTE — Progress Notes (Signed)
   Subjective:    Patient ID: Deborah Shields, female    DOB: [birth year], 74 y.o.   MRN: [id number]  HPI  She is scheduled for rectocele surgery 08/15/14 by Dr. Emilie Rutter, gynecologist. She has a tennis ball sized prolapse which results in huge but irregular bowel movements.She is treating symptoms with Activia yogurt & Dulcolax.  She has been compliant with her thyroid and blood pressure medicines without adverse effects.  Previously she was having weakness and near-syncope until her cardiologist decreased the losartan dose. Since that time she's had no cardiopulmonary symptoms  Blood pressure at home averages less than 120/70.  She has had some renal insufficiency with creatinine up to 1.8; today her creatinine was 1.36 and GFR 40.2.   She is on a heart healthy diet.   Her LDL was 154; she's been statin intolerant. Weather permitting she does walk. She also does leg exercises again without chest symptoms.    Review of Systems  Constitutional: 10-12 # weight loss with change in thyroid dose.TSH  0.56 on 06/21/14 by Dr Wilson Singer. No  significant fatigue; sleep disorder; change in appetite. Eye: no blurred, double ,loss of vision Cardiovascular: no palpitations; racing; irregularity ENT/GI: no frank constipation; diarrhea;hoarseness;dysphagia Derm: no change in nails,hair,skin Neuro: no numbness or tingling; tremor Psych:no anxiety; depression; panic attacks Endo: no temperature intolerance to heat ,cold     Objective:   Physical Exam  Pertinent or positive findings include: She has an upper plate; the lower teeth are immaculate. She has some wax in the right canal. The thyroid is small and slightly irregular. There is a grade 1 systolic murmur at the base. She has decreased pedal pulses, especially dorsalis pedis pulses. She has marked PIP/DIP arthritic changes hands, greater in the right hand than the left. Mild knee crepitus  General appearance :adequately nourished; in no  distress. Eyes: No conjunctival inflammation or scleral icterus is present. Oral exam:  Lips and gums are healthy appearing.There is no oropharyngeal erythema or exudate noted.  Heart:  Normal rate and regular rhythm. S1 and S2 normal without gallop,  click, rub or other extra sounds   Lungs:Chest clear to auscultation; no wheezes, rhonchi,rales ,or rubs present.No increased work of breathing.  Abdomen: bowel sounds normal, soft and non-tender without masses, organomegaly or hernias noted.  No guarding or rebound.  Vascular : all pulses equal ; no bruits present. Skin:Warm & dry.  Intact without suspicious lesions or rashes ; no jaundice or tenting Lymphatic: No lymphadenopathy is noted about the head, neck, axilla Neuro: Strength, tone & DTRs normal.       Assessment & Plan:  See Current Assessment & Plan in Problem List under specific Diagnosis

## 2014-08-04 NOTE — Assessment & Plan Note (Signed)
Creatinine improved

## 2014-08-04 NOTE — Patient Instructions (Addendum)
Share results with all non [facility] medical staff seen   Minimal Blood Pressure Goal= AVERAGE < 140/90;  Ideal is an AVERAGE < 135/85. This AVERAGE should be calculated from @ least 5-7 BP readings taken @ different times of day on different days of week. You should not respond to isolated BP readings , but rather the AVERAGE for that week .Please bring your  blood pressure cuff to office visits to verify that it is reliable.It  can also be checked against the blood pressure device at the pharmacy. Finger or wrist cuffs are not dependable; an arm cuff is.

## 2014-08-04 NOTE — Progress Notes (Signed)
Pre visit review using our clinic review tool, if applicable. No additional management support is needed unless otherwise documented below in the visit note. 

## 2014-08-05 ENCOUNTER — Telehealth: Payer: Self-pay

## 2014-08-05 NOTE — Assessment & Plan Note (Signed)
Medically cleared for surgery

## 2014-08-05 NOTE — Telephone Encounter (Signed)
Office note and labs have been faxed to Dr Dory Horn, GYN @ [phone]

## 2014-08-05 NOTE — Telephone Encounter (Signed)
-----   Message from Hendricks Limes, MD sent at 08/05/2014  6:17 AM EST ----- Please FAX office visit & lab results to Dr Dory Horn, [place] Thanks

## 2014-08-09 DIAGNOSIS — N819 Female genital prolapse, unspecified: Secondary | ICD-10-CM | POA: Diagnosis not present

## 2014-08-15 DIAGNOSIS — N8189 Other female genital prolapse: Secondary | ICD-10-CM | POA: Diagnosis not present

## 2014-08-15 DIAGNOSIS — N814 Uterovaginal prolapse, unspecified: Secondary | ICD-10-CM | POA: Diagnosis not present

## 2014-10-19 ENCOUNTER — Encounter: Payer: Self-pay | Admitting: Family

## 2014-10-20 ENCOUNTER — Ambulatory Visit (INDEPENDENT_AMBULATORY_CARE_PROVIDER_SITE_OTHER): Payer: Medicare Other | Admitting: Family

## 2014-10-20 ENCOUNTER — Encounter: Payer: Self-pay | Admitting: Family

## 2014-10-20 ENCOUNTER — Ambulatory Visit (HOSPITAL_COMMUNITY)
Admission: RE | Admit: 2014-10-20 | Discharge: 2014-10-20 | Disposition: A | Discharge status: Home / Self Care | Payer: Medicare Other | Source: Ambulatory Visit | Attending: Family | Admitting: Family

## 2014-10-20 VITALS — BP 124/83 | HR 61 | Resp 16 | Ht 65.0 in | Wt 122.0 lb

## 2014-10-20 DIAGNOSIS — I6523 Occlusion and stenosis of bilateral carotid arteries: Secondary | ICD-10-CM | POA: Insufficient documentation

## 2014-10-20 NOTE — Progress Notes (Signed)
Established Carotid Patient   History of Present Illness  Deborah Shields is a 74 y.o. female patient of Dr. [name] who has known right ICA tortuosity and left ICA is c/w fibromuscular dysplasia. She returns today for scheduled surveillance.  Patient denies ever having a stroke or TIA symptoms. Specifically the patient denies a history of: amaurosis fugax or monocular blindness, facial drooping, hemiplegia, or receptive or expressive aphasia.  She denies claudication, denies non-healing wounds.   Sees a cardiologist due to family hx of CAD and personal hx of murmur.  Patient reports New Medical or Surgical History: rectocele repair February 2016.   Finished corticosteroid injections in her T-spine, Dr. Maryjean Ka, for pain in the left shoulder, tingling and numbness in left arm/hand which helped.  Pt Diabetic: No  Pt smoker: non-smoker   Pt meds include:  Statin : No: all statins tried caused very elevated liver and muscle enzymes and muscle weakness/severe cramping  Betablocker: Yes  ASA: Yes  Other anticoagulants/antiplatelets: no  Past Medical History  Diagnosis Date  . Diverticulosis of colon   . Hypothyroidism     Dr Wilson Singer  . Hyperlipidemia     NMR 07/2009: LDL 200 (2260/1233)TG 99, HDL 65. LDL goal =<120, ideally <100. father MI @ 51  . Hypertension   . Microscopic hematuria   . Degenerative joint disease   . Peripheral vascular disease     ICA bilat, Dr.Charles fields, [facility]  . Carotid artery occlusion   . Heart murmur   . Anxiety   . Peripheral neuropathy     compressive in UE bilaterally; Dr Daylene Katayama  . Aortic insufficiency     mild due to degenerative changes  . Basal cell carcinoma 2007    [facility] [facility] Left leg    Social History History  Substance Use Topics  . Smoking status: Never Smoker   . Smokeless tobacco: Never Used  . Alcohol Use: No    Family History Family History  Problem Relation Age of Onset  . Heart attack Father 15  . Heart disease  Father     Before age 81  . Colon cancer Mother 37  . Stroke Mother 74  . Kidney disease Mother     ? renal calculi; S/P resecton of kidney  . Cancer Mother 75    Colon  . Ovarian cancer Sister   . Other Sister     Valve replacement (aortic & mitral ) in 2 sisters  . Heart disease Sister     Before age 3  . Heart disease Brother     aortic & mitral valve replacement in 2 bro; both had CBAG  . Diabetes Neg Hx     Surgical History Past Surgical History  Procedure Laterality Date  . Abdominal hysterectomy  1973    age 13 due to dysfuctional menses; HRT x 25-30 years  . Bilateral oophorectomy  1990    prophylactically (sister had ovarian ca)  . Appendectomy  1952  . Cholecystectomy  2006  . Tonsillectomy  1957  . Orthopedic surgery  1989/200/2002/2012    elbows,shoulder surgery x 3, right hand  . Carpal tunnel release  1989    right  . Basal cell cancer  03/2006    leg  . Cataract extraction, bilateral  2010    Dr Kathrin Penner  . US echocardiography  05/01/2010    trace MR,AI,TR;EF =>55%  . Nm myoview ltd  06/13/2008    low risk scan  . Eye surgery  Bilateral eye  . Colonoscopy      Dr Ardis Hughs    Allergies  Allergen Reactions  . Penicillins Itching and Swelling    REACTION: itching and edema  . Aspirin Nausea Only    REACTION: GI upset  ( pt can take 81 mg but NOT   325 mg ASA )  . Codeine Nausea And Vomiting    REACTION: GI upset  . Lovastatin Nausea Only    REACTION: nausea  . Benazepril Hcl   . Bupropion Hcl     REACTION: tinnitis  . Ezetimibe     REACTION: GI symptoms  . Fenofibrate     Myalgia   . Paroxetine   . Pravastatin Sodium Other (See Comments)    REACTION: elevated CPK - Muscle's in bilateral Leg    Current Outpatient Prescriptions  Medication Sig Dispense Refill  . amLODipine (NORVASC) 5 MG tablet Take 1 tablet by mouth  daily 90 tablet 3  . aspirin 81 MG tablet Take 81 mg by mouth daily.      Marland Kitchen b complex vitamins tablet Take 1  tablet by mouth daily.      . Calcium Carbonate-Vitamin D (CALCIUM-VITAMIN D) 600-200 MG-UNIT CAPS Take by mouth.      . Cholecalciferol (VITAMIN D) 1000 UNITS capsule Take 1,000 Units by mouth daily.      . cycloSPORINE (RESTASIS) 0.05 % ophthalmic emulsion Place 1 drop into both eyes 2 (two) times daily.     [name] Sodium (COLACE PO) Take by mouth as needed.    Marland Kitchen levothyroxine (SYNTHROID) 88 MCG tablet Take 88 mcg by mouth daily. Taking the brand "synthroid"    . LORazepam (ATIVAN) 0.5 MG tablet Take 1 tablet (0.5 mg total) by mouth 3 (three) times daily as needed. 90 tablet 0  . losartan (COZAAR) 100 MG tablet Take 0.5 tablets (50 mg total) by mouth daily. 45 tablet 3  . meclizine (ANTIVERT) 25 MG tablet Take 1 tablet (25 mg total) by mouth daily. 1/2 every 8 hrs prn for imbalance (Patient taking differently: Take 12.5 mg by mouth 3 (three) times daily as needed. 1/2 every 8 hrs prn for imbalance) 30 tablet 2  . metoprolol (LOPRESSOR) 50 MG tablet Take one-half tablet by  mouth twice a day 90 tablet 3  . Multiple Vitamins-Minerals (CENTRUM SILVER PO) Take by mouth.      . traMADol (ULTRAM) 50 MG tablet 1/2-1 by mouth once daily as needed. 90 tablet 0  . vitamin E 100 UNIT capsule Take 100 Units by mouth daily.      No current facility-administered medications for this visit.    Review of Systems : See HPI for pertinent positives and negatives.  Physical Examination  Filed Vitals:   10/20/14 1517 10/20/14 1518  BP: 129/79 124/83  Pulse: 61 61  Resp: 16   Height: 5\' 5"  (1.651 m)   Weight: 122 lb (55.339 kg)    Body mass index is 20.3 kg/(m^2).  General: WDWN female in NAD  GAIT: normal  Eyes: PERRLA  Pulmonary: CTAB, Negative Rales, Negative rhonchi, & Negative wheezing.  Cardiac: regular Rhythm, no murmur detected.   VASCULAR EXAM  Carotid Bruits  Left  Right    Negative  Negative    Aorta is not palpable.  Radial pulses are 2+ palpable and equal.   LE  Pulses  LEFT  RIGHT   POPLITEAL  not palpable  not palpable   POSTERIOR TIBIAL  not palpable  Not palpable  DORSALIS PEDIS  ANTERIOR TIBIAL  Faintly palpable  Faintly palpable   Gastrointestinal: soft, nontender, BS WNL, no r/g, no palpable masses.  Musculoskeletal: Negative muscle atrophy/wasting. M/S 5/5 throughout, Extremities without ischemic changes.  Neurologic: A&O X 3; Appropriate Affect, Speech is normal  CN 2-12 intact, Pain and light touch intact in extremities, Motor exam as listed above.         Non-Invasive Vascular Imaging CAROTID DUPLEX 10/20/2014   CEREBROVASCULAR DUPLEX EVALUATION    INDICATION: Carotid artery disease     PREVIOUS INTERVENTION(S):     DUPLEX EXAM:     RIGHT  LEFT  Peak Systolic Velocities (cm/s) End Diastolic Velocities (cm/s) Plaque LOCATION Peak Systolic Velocities (cm/s) End Diastolic Velocities (cm/s) Plaque  64 18  CCA PROXIMAL 44 10   69 21  CCA MID 50 14   50 18  CCA DISTAL 58 17   62 11  ECA 57 13   46 14 HT ICA PROXIMAL 36 13 HT  56 22  ICA MID 80 35   96 33  ICA DISTAL 108 44     1.39 ICA / CCA Ratio (PSV) 2.16  Antegrade  Vertebral Flow Antegrade   [id number] Brachial Systolic Pressure (mmHg) A999333  Multiphasic (Subclavian artery) Brachial Artery Waveforms Multiphasic (Subclavian artery)    Plaque Morphology:  HM = Homogeneous, HT = Heterogeneous, CP = Calcific Plaque, SP = Smooth Plaque, IP = Irregular Plaque  ADDITIONAL FINDINGS:     IMPRESSION: Right and left proximal internal carotid artery velocities suggest a 1-49% stenosis.  Elevated velocity noted in the distal left internal carotid artery most likely due to vessel tortuosity.     Compared to the previous exam:  No significant change in comparison to the last exam on 10/14/2013.      Assessment: Deborah Shields is a 74 y.o. female who presents with asymptomatic minimal bilateral ICA stenosis. Elevated velocity noted in the distal left internal carotid  artery most likely due to vessel tortuosity. No significant change in comparison to the last exam on 10/14/2013. Review of records: in April 2014 carotid Duplex suggested 60-79% bilateral ICA stenoses.   Plan: Follow-up in 1 year with Carotid Duplex.   I discussed in depth with the patient the nature of atherosclerosis, and emphasized the importance of maximal medical management including strict control of blood pressure, blood glucose, and lipid levels, obtaining regular exercise, and continued cessation of smoking.  The patient is aware that without maximal medical management the underlying atherosclerotic disease process will progress, limiting the benefit of any interventions. The patient was given information about stroke prevention and what symptoms should prompt the patient to seek immediate medical care. Thank you for allowing [place] to participate in this patient's care.  Clemon Chambers, RN, MSN, FNP-C [facility] Office: [phone]  Clinic Physician: [name]  10/20/2014 3:34 PM

## 2014-10-20 NOTE — Patient Instructions (Signed)
Stroke Prevention Some medical conditions and behaviors are associated with an increased chance of having a stroke. You may prevent a stroke by making healthy choices and managing medical conditions. HOW CAN I REDUCE MY RISK OF HAVING A STROKE?   Stay physically active. Get at least 30 minutes of activity on most or all days.  Do not smoke. It may also be helpful to avoid exposure to secondhand smoke.  Limit alcohol use. Moderate alcohol use is considered to be:  No more than 2 drinks per day for men.  No more than 1 drink per day for nonpregnant women.  Eat healthy foods. This involves:  Eating 5 or more servings of fruits and vegetables a day.  Making dietary changes that address high blood pressure (hypertension), high cholesterol, diabetes, or obesity.  Manage your cholesterol levels.  Making food choices that are high in fiber and low in saturated fat, trans fat, and cholesterol may control cholesterol levels.  Take any prescribed medicines to control cholesterol as directed by your health care provider.  Manage your diabetes.  Controlling your carbohydrate and sugar intake is recommended to manage diabetes.  Take any prescribed medicines to control diabetes as directed by your health care provider.  Control your hypertension.  Making food choices that are low in salt (sodium), saturated fat, trans fat, and cholesterol is recommended to manage hypertension.  Take any prescribed medicines to control hypertension as directed by your health care provider.  Maintain a healthy weight.  Reducing calorie intake and making food choices that are low in sodium, saturated fat, trans fat, and cholesterol are recommended to manage weight.  Stop drug abuse.  Avoid taking birth control pills.  Talk to your health care provider about the risks of taking birth control pills if you are over 35 years old, smoke, get migraines, or have ever had a blood clot.  Get evaluated for sleep  disorders (sleep apnea).  Talk to your health care provider about getting a sleep evaluation if you snore a lot or have excessive sleepiness.  Take medicines only as directed by your health care provider.  For some people, aspirin or blood thinners (anticoagulants) are helpful in reducing the risk of forming abnormal blood clots that can lead to stroke. If you have the irregular heart rhythm of atrial fibrillation, you should be on a blood thinner unless there is a good reason you cannot take them.  Understand all your medicine instructions.  Make sure that other conditions (such as anemia or atherosclerosis) are addressed. SEEK IMMEDIATE MEDICAL CARE IF:   You have sudden weakness or numbness of the face, arm, or leg, especially on one side of the body.  Your face or eyelid droops to one side.  You have sudden confusion.  You have trouble speaking (aphasia) or understanding.  You have sudden trouble seeing in one or both eyes.  You have sudden trouble walking.  You have dizziness.  You have a loss of balance or coordination.  You have a sudden, severe headache with no known cause.  You have new chest pain or an irregular heartbeat. Any of these symptoms may represent a serious problem that is an emergency. Do not wait to see if the symptoms will go away. Get medical help at once. Call your local emergency services (911 in [place] Do not drive yourself to the hospital. Document Released: 07/18/2004 Document Revised: 10/25/2013 Document Reviewed: 12/11/2012 ExitCare Patient Information 2015 [employer]. This information is not intended to replace advice given   to you by your health care provider. Make sure you discuss any questions you have with your health care provider.  

## 2014-10-21 NOTE — Addendum Note (Signed)
Addended by: Mena Goes on: 10/21/2014 03:14 PM   Modules accepted: Orders

## 2014-10-26 DIAGNOSIS — H16103 Unspecified superficial keratitis, bilateral: Secondary | ICD-10-CM | POA: Diagnosis not present

## 2014-10-26 DIAGNOSIS — H04122 Dry eye syndrome of left lacrimal gland: Secondary | ICD-10-CM | POA: Diagnosis not present

## 2014-10-26 DIAGNOSIS — H26493 Other secondary cataract, bilateral: Secondary | ICD-10-CM | POA: Diagnosis not present

## 2014-10-26 DIAGNOSIS — H04121 Dry eye syndrome of right lacrimal gland: Secondary | ICD-10-CM | POA: Diagnosis not present

## 2014-11-10 ENCOUNTER — Encounter: Payer: Self-pay | Admitting: Gastroenterology

## 2014-11-24 DIAGNOSIS — L720 Epidermal cyst: Secondary | ICD-10-CM | POA: Diagnosis not present

## 2014-11-24 DIAGNOSIS — Z85828 Personal history of other malignant neoplasm of skin: Secondary | ICD-10-CM | POA: Diagnosis not present

## 2014-11-24 DIAGNOSIS — L821 Other seborrheic keratosis: Secondary | ICD-10-CM | POA: Diagnosis not present

## 2014-11-24 DIAGNOSIS — D692 Other nonthrombocytopenic purpura: Secondary | ICD-10-CM | POA: Diagnosis not present

## 2014-11-24 DIAGNOSIS — D2271 Melanocytic nevi of right lower limb, including hip: Secondary | ICD-10-CM | POA: Diagnosis not present

## 2014-11-24 DIAGNOSIS — D1801 Hemangioma of skin and subcutaneous tissue: Secondary | ICD-10-CM | POA: Diagnosis not present

## 2014-12-21 DIAGNOSIS — E034 Atrophy of thyroid (acquired): Secondary | ICD-10-CM | POA: Diagnosis not present

## 2014-12-28 DIAGNOSIS — E039 Hypothyroidism, unspecified: Secondary | ICD-10-CM | POA: Diagnosis not present

## 2014-12-28 DIAGNOSIS — M1990 Unspecified osteoarthritis, unspecified site: Secondary | ICD-10-CM | POA: Diagnosis not present

## 2014-12-28 DIAGNOSIS — F419 Anxiety disorder, unspecified: Secondary | ICD-10-CM | POA: Diagnosis not present

## 2015-02-14 DIAGNOSIS — Z23 Encounter for immunization: Secondary | ICD-10-CM | POA: Diagnosis not present

## 2015-03-02 ENCOUNTER — Telehealth: Payer: Self-pay | Admitting: Internal Medicine

## 2015-03-03 ENCOUNTER — Other Ambulatory Visit: Payer: Self-pay

## 2015-03-03 DIAGNOSIS — I10 Essential (primary) hypertension: Secondary | ICD-10-CM

## 2015-03-03 MED ORDER — LOSARTAN POTASSIUM 100 MG PO TABS: 50.0000 mg | ORAL_TABLET | Freq: Every day | ORAL | Status: DC

## 2015-03-03 MED ORDER — AMLODIPINE BESYLATE 5 MG PO TABS: ORAL_TABLET | ORAL | Status: DC

## 2015-03-06 NOTE — Telephone Encounter (Signed)
Refill request

## 2015-03-06 NOTE — Telephone Encounter (Signed)
Refill request for Ativan. Last OV 2/16, last refill 2014 #90. Please advise.

## 2015-03-07 ENCOUNTER — Other Ambulatory Visit: Payer: Self-pay | Admitting: Emergency Medicine

## 2015-03-07 DIAGNOSIS — F411 Generalized anxiety disorder: Secondary | ICD-10-CM

## 2015-03-07 MED ORDER — LORAZEPAM 0.5 MG PO TABS
0.5000 mg | ORAL_TABLET | Freq: Three times a day (TID) | ORAL | Status: DC | PRN
Start: 2015-03-07 — End: 2015-08-15

## 2015-03-07 NOTE — Telephone Encounter (Signed)
Ativan faxed to [facility]

## 2015-03-07 NOTE — Telephone Encounter (Signed)
OK X1 

## 2015-03-09 ENCOUNTER — Other Ambulatory Visit: Payer: Self-pay

## 2015-03-09 DIAGNOSIS — Z1231 Encounter for screening mammogram for malignant neoplasm of breast: Secondary | ICD-10-CM

## 2015-04-20 ENCOUNTER — Encounter: Payer: Self-pay | Admitting: Internal Medicine

## 2015-04-20 ENCOUNTER — Ambulatory Visit (INDEPENDENT_AMBULATORY_CARE_PROVIDER_SITE_OTHER)
Admission: RE | Admit: 2015-04-20 | Discharge: 2015-04-20 | Disposition: A | Discharge status: Home / Self Care | Payer: Medicare Other | Source: Ambulatory Visit | Attending: Internal Medicine | Admitting: Internal Medicine

## 2015-04-20 ENCOUNTER — Ambulatory Visit (INDEPENDENT_AMBULATORY_CARE_PROVIDER_SITE_OTHER): Payer: Medicare Other | Admitting: Internal Medicine

## 2015-04-20 ENCOUNTER — Other Ambulatory Visit (INDEPENDENT_AMBULATORY_CARE_PROVIDER_SITE_OTHER): Payer: Medicare Other

## 2015-04-20 VITALS — BP 112/84 | HR 76 | Temp 98.4°F | Resp 18 | Wt 122.0 lb

## 2015-04-20 DIAGNOSIS — J209 Acute bronchitis, unspecified: Secondary | ICD-10-CM

## 2015-04-20 DIAGNOSIS — I6523 Occlusion and stenosis of bilateral carotid arteries: Secondary | ICD-10-CM | POA: Diagnosis not present

## 2015-04-20 DIAGNOSIS — R509 Fever, unspecified: Secondary | ICD-10-CM

## 2015-04-20 DIAGNOSIS — R05 Cough: Secondary | ICD-10-CM | POA: Diagnosis not present

## 2015-04-20 LAB — CBC WITH DIFFERENTIAL/PLATELET
Basophils Absolute: 0 10*3/uL (ref 0.0–0.1)
Basophils Relative: 0.3 % (ref 0.0–3.0)
Eosinophils Absolute: 0.1 10*3/uL (ref 0.0–0.7)
Eosinophils Relative: 1.5 % (ref 0.0–5.0)
HCT: 41.4 % (ref 36.0–46.0)
Hemoglobin: 13.5 g/dL (ref 12.0–15.0)
Lymphocytes Relative: 17.2 % (ref 12.0–46.0)
Lymphs Abs: 1.5 10*3/uL (ref 0.7–4.0)
MCHC: 32.6 g/dL (ref 30.0–36.0)
MCV: 90.8 fl (ref 78.0–100.0)
Monocytes Absolute: 1.1 10*3/uL — ABNORMAL HIGH (ref 0.1–1.0)
Monocytes Relative: 12.8 % — ABNORMAL HIGH (ref 3.0–12.0)
Neutro Abs: 5.9 10*3/uL (ref 1.4–7.7)
Neutrophils Relative %: 68.2 % (ref 43.0–77.0)
Platelets: 231 10*3/uL (ref 150.0–400.0)
RBC: 4.56 Mil/uL (ref 3.87–5.11)
RDW: 14.1 % (ref 11.5–15.5)
WBC: 8.6 10*3/uL (ref 4.0–10.5)

## 2015-04-20 MED ORDER — PREDNISONE 10 MG PO TABS: ORAL_TABLET | ORAL | Status: DC

## 2015-04-20 MED ORDER — AZITHROMYCIN 250 MG PO TABS: ORAL_TABLET | ORAL | Status: DC

## 2015-04-20 MED ORDER — BENZONATATE 200 MG PO CAPS: 200.0000 mg | ORAL_CAPSULE | Freq: Three times a day (TID) | ORAL | Status: DC | PRN

## 2015-04-20 NOTE — Progress Notes (Signed)
Pre visit review using our clinic review tool, if applicable. No additional management support is needed unless otherwise documented below in the visit note. 

## 2015-04-20 NOTE — Progress Notes (Signed)
   Subjective:    Patient ID: Deborah Shields, female    DOB: 03/23/1941, 74 y.o.   MRN: [id number]  HPI Her symptoms began 4 days ago as a high fever up to 101.5. This was associated with frontal headache. She had a recurrence of the fever in the next 24 hours but not since. The last 2 days she's been having green sputum with chest congestion, shortness of breath, & intermittent wheezing. She describes some watery eyes.  Her only medication treatment has been Tylenol. She has a history of allergy to penicillin and codeine.  She denies a history of asthma. She's never smoked.  She denies any other upper respiratory tract infection symptoms. She's had no sneezing.   Review of Systems Facial pain , nasal purulence, dental pain, sore throat , otic pain or otic discharge denied.  Extrinsic symptoms of sneezing or angioedema are denied. There is no  paroxysmal nocturnal dyspnea.     Objective:   Physical Exam  She has an upper dental plate. An S4 is present; second heart sound is are increased. She has diffuse expiratory wheezing to a mild degree. She has severe mixed joint changes in the hands.  General appearance:Adequately nourished; no acute distress or increased work of breathing is present.    Lymphatic: No  lymphadenopathy about the head, neck, or axilla .  Eyes: No conjunctival inflammation or lid edema is present. There is no scleral icterus.  Ears:  External ear exam shows no significant lesions or deformities.  Otoscopic examination reveals clear canals, tympanic membranes are intact bilaterally without bulging, retraction, inflammation or discharge.  Nose:  External nasal examination shows no deformity or inflammation. Nasal mucosa are dry without lesions or exudates No septal dislocation or deviation.No obstruction to airflow.   Oral exam:  lips and gums are healthy appearing.There is no oropharyngeal erythema or exudate .  Neck:  No deformities, thyromegaly, masses, or  tenderness noted.   Supple with full range of motion without pain.   Heart:  Normal rate and regular rhythm. S1  normal without gallop, murmur, click, or rub .   Extremities:  No cyanosis, edema, or clubbing  noted   Skin: Warm & dry w/o tenting or jaundice. No significant lesions or rash.     Assessment & Plan:  #1 acute bronchitis with bronchospasm. No history of asthma or smoking.  #2 fever  Plan: See orders and recommendations

## 2015-04-20 NOTE — Patient Instructions (Signed)
  Your next office appointment will be determined based upon review of your pending labs  and  xrays  Those written interpretation of the lab results and instructions will be transmitted to you by My Chart   Critical results will be called.   Followup as needed for any active or acute issue. Please report any significant change in your symptoms.  To use Breo: Pull cap down to release medication. Blow out as much as possible then inhale powder as deeply as possible. Hold breath to count of ten then exhale.  Gargle and spit after use.

## 2015-04-24 ENCOUNTER — Telehealth: Payer: Self-pay | Admitting: Internal Medicine

## 2015-04-24 DIAGNOSIS — J189 Pneumonia, unspecified organism: Secondary | ICD-10-CM

## 2015-04-24 NOTE — Telephone Encounter (Signed)
FYI Patient states she is feeling better but it is a slow process.  States things are looking up.  Patient has scheduled follow up for Friday.  Does think that Prednisone is working but keeping her up some at night.  Temp spikes some times in the evening if she over exerts herself.  She states her cough and mucus has slowed down some.

## 2015-04-24 NOTE — Telephone Encounter (Signed)
Order for Chest  XRay entered.She can get it Thurs afternoon. Please  blowup at least 5 balloons a day to enhance inflation of the lungs and resolution of the pneumonia

## 2015-04-26 NOTE — Telephone Encounter (Signed)
Called to inform pt.

## 2015-04-27 ENCOUNTER — Ambulatory Visit (INDEPENDENT_AMBULATORY_CARE_PROVIDER_SITE_OTHER)
Admission: RE | Admit: 2015-04-27 | Discharge: 2015-04-27 | Disposition: A | Discharge status: Home / Self Care | Payer: Medicare Other | Source: Ambulatory Visit | Attending: Internal Medicine | Admitting: Internal Medicine

## 2015-04-27 DIAGNOSIS — J189 Pneumonia, unspecified organism: Secondary | ICD-10-CM | POA: Diagnosis not present

## 2015-04-28 ENCOUNTER — Encounter: Payer: Self-pay | Admitting: Internal Medicine

## 2015-04-28 ENCOUNTER — Ambulatory Visit (INDEPENDENT_AMBULATORY_CARE_PROVIDER_SITE_OTHER): Payer: Medicare Other | Admitting: Internal Medicine

## 2015-04-28 VITALS — BP 120/74 | HR 70 | Temp 97.6°F | Wt 122.0 lb

## 2015-04-28 DIAGNOSIS — J189 Pneumonia, unspecified organism: Secondary | ICD-10-CM | POA: Diagnosis not present

## 2015-04-28 DIAGNOSIS — I10 Essential (primary) hypertension: Secondary | ICD-10-CM | POA: Diagnosis not present

## 2015-04-28 DIAGNOSIS — G479 Sleep disorder, unspecified: Secondary | ICD-10-CM

## 2015-04-28 DIAGNOSIS — I6523 Occlusion and stenosis of bilateral carotid arteries: Secondary | ICD-10-CM

## 2015-04-28 MED ORDER — METOPROLOL TARTRATE 50 MG PO TABS: ORAL_TABLET | ORAL | Status: DC

## 2015-04-28 MED ORDER — LOSARTAN POTASSIUM 100 MG PO TABS: 50.0000 mg | ORAL_TABLET | Freq: Every day | ORAL | Status: DC

## 2015-04-28 MED ORDER — AZITHROMYCIN 250 MG PO TABS: ORAL_TABLET | ORAL | Status: DC

## 2015-04-28 MED ORDER — AMLODIPINE BESYLATE 5 MG PO TABS
ORAL_TABLET | ORAL | Status: DC
Start: 2015-04-28 — End: 2015-08-15

## 2015-04-28 NOTE — Patient Instructions (Signed)
Plain Mucinex (NOT D) for thick secretions ;force NON dairy fluids .   Nasal cleansing in the shower as discussed with lather of mild shampoo.After 10 seconds wash off lather while  exhaling through nostrils. Make sure that all residual soap is removed to prevent irritation.  Flonase OR Nasacort AQ 1 spray in each nostril twice a day as needed. Use the "crossover" technique into opposite nostril spraying toward opposite ear @ 45 degree angle, not straight up into nostril.  Plain Allegra (NOT D )  160 daily , Loratidine 10 mg , OR Zyrtec 10 mg @ bedtime  as needed for itchy eyes & sneezing.  To prevent sleep dysfunction follow these instructions for sleep hygiene. Do not read, watch TV, or eat in bed. Do not get into bed until you are ready to turn off the light &  to go to sleep. Do not ingest stimulants ( decongestants, diet pills, nicotine, caffeine) after the evening meal.Do not take daytime naps.Cardiovascular exercise, this can be as simple a program as walking, is recommended 30-45 minutes 3-4 times per week. If you're not exercising you should take 6-8 weeks to build up to this level.

## 2015-04-28 NOTE — Progress Notes (Signed)
   Subjective:    Patient ID: Deborah Shields, female    DOB: [birth year], 74 y.o.   MRN: [id number]  HPI She returns for F/U of CAP in the RUL.She states that she is at least 50% better after finishing antibiotics. She does have scant green/brown sputum. She's also having exertional dyspnea; she may have fevers late in the day. This responds to Tylenol. She continues to be fatigued and weak. She does have some Tessalon Perles left but has finished her prednisone also. She is not been resting well when she sleeps but this is a chronic issue. She took her sister's Xanax with good response.  She now has some postnasal drainage without symptoms of upper respiratory tract infection.  CXrays were reviewed . There is minimal residual RUL infiltrate.   Review of Systems Frontal headache, facial pain , nasal purulence, dental pain, sore throat , otic pain or otic discharge denied. No chills or sweats.     Objective:   Physical Exam She appears much stronger and is in no acute distress. She has an upper dental plate. She has minor rales anteriorly. There is no increased work of breathing. She has marked DIP/PIP arthritic changes.  General appearance:Thin but adequately nourished; no  increased work of breathing is present.    Lymphatic: No  lymphadenopathy about the head, neck, or axilla .  Eyes: No conjunctival inflammation or lid edema is present. There is no scleral icterus.  Ears:  External ear exam shows no significant lesions or deformities.  Otoscopic examination reveals clear canals, tympanic membranes are intact bilaterally without bulging, retraction, inflammation or discharge.  Nose:  External nasal examination shows no deformity or inflammation. Nasal mucosa are pink and moist without lesions or exudates No septal dislocation or deviation.No obstruction to airflow.   Oral exam:  lips and gums are healthy appearing.There is no oropharyngeal erythema or exudate .  Neck:  No  deformities, thyromegaly, masses, or tenderness noted.     Heart:  Normal rate and regular rhythm. S1 and S2 normal without gallop, murmur, click, rub or other extra sounds.   Extremities:  No cyanosis, edema, or clubbing  noted    Skin: Warm & dry w/o tenting or jaundice. No significant lesions or rash.     Assessment & Plan:  #1 CAP in RUL almost totally resolved radiographically #2 chronic sleep dysfunction #3 HTN; refills requested  See orders

## 2015-04-28 NOTE — Progress Notes (Signed)
Pre visit review using our clinic review tool, if applicable. No additional management support is needed unless otherwise documented below in the visit note. 

## 2015-05-01 ENCOUNTER — Telehealth: Payer: Self-pay | Admitting: Internal Medicine

## 2015-05-01 DIAGNOSIS — J209 Acute bronchitis, unspecified: Secondary | ICD-10-CM

## 2015-05-01 MED ORDER — BENZONATATE 200 MG PO CAPS: 200.0000 mg | ORAL_CAPSULE | Freq: Three times a day (TID) | ORAL | Status: DC | PRN

## 2015-05-01 NOTE — Telephone Encounter (Signed)
Pt was seen by Hopp on 04/28/15. Pt has RX prescribed on 04/20/15. Please advise if you are okay sending in more for the pt as Linus Orn is unable to access Epic until Wednesday.

## 2015-05-01 NOTE — Telephone Encounter (Signed)
Verified [facility] on [address] is the pharmacy  benzonatate (TESSALON) 200 MG capsule [id number]  Patient requests a fill of this. She states that she told dr hopper that she thought she had enough, but in fact does not.

## 2015-05-01 NOTE — Telephone Encounter (Signed)
Ok sent to pharmacy

## 2015-06-06 DIAGNOSIS — M25511 Pain in right shoulder: Secondary | ICD-10-CM | POA: Diagnosis not present

## 2015-06-09 ENCOUNTER — Ambulatory Visit
Admission: RE | Admit: 2015-06-09 | Discharge: 2015-06-09 | Disposition: A | Discharge status: Home / Self Care | Payer: Medicare Other | Source: Ambulatory Visit

## 2015-06-09 DIAGNOSIS — Z1231 Encounter for screening mammogram for malignant neoplasm of breast: Secondary | ICD-10-CM | POA: Diagnosis not present

## 2015-06-21 DIAGNOSIS — M25511 Pain in right shoulder: Secondary | ICD-10-CM | POA: Diagnosis not present

## 2015-06-28 DIAGNOSIS — S43431A Superior glenoid labrum lesion of right shoulder, initial encounter: Secondary | ICD-10-CM | POA: Diagnosis not present

## 2015-06-28 DIAGNOSIS — M75111 Incomplete rotator cuff tear or rupture of right shoulder, not specified as traumatic: Secondary | ICD-10-CM | POA: Diagnosis not present

## 2015-06-28 DIAGNOSIS — E034 Atrophy of thyroid (acquired): Secondary | ICD-10-CM | POA: Diagnosis not present

## 2015-07-04 ENCOUNTER — Ambulatory Visit: Payer: Medicare Other | Admitting: Family

## 2015-07-06 ENCOUNTER — Telehealth: Payer: Self-pay | Admitting: Cardiovascular Disease

## 2015-07-06 NOTE — Telephone Encounter (Signed)
Unfortunately, I only have 1.5 clinic days next week and I do not think we can squeeze her in. Please offer her first available.

## 2015-07-06 NOTE — Telephone Encounter (Signed)
Pt is calling in stating that she will be having a shoulder surgery (which has not been scheduled yet) and she would like to see Dr. Loletha Shields as soon as possible to be cleared. I tried to offer the pt and appt with an APP but she said she would rather see Dr. Loletha Shields. She wants to be squeezed in sometime next week. Please f/u with the pt  Thanks

## 2015-07-06 NOTE — Telephone Encounter (Signed)
Routed to [facility] Administrative pool to make first available appointment to patient:  Deborah Klein, MD at 07/06/2015 11:29 AM     Status: Signed       Expand All Collapse All   Unfortunately, I only have 1.5 clinic days next week and I do not think we can squeeze her in. Please offer her first available.            [name], RN at 07/06/2015 9:13 AM     Status: Signed       Expand All Collapse All    [name] at 07/06/2015 9:03 AM     Status: Signed       Expand All Collapse All  Pt is calling in stating that she will be having a shoulder surgery (which has not been scheduled yet) and she would like to see Dr. Loletha Grayer as soon as possible to be cleared. I tried to offer the pt and appt with an APP but she said she would rather see Dr. Loletha Grayer. She wants to be squeezed in sometime next week. Please f/u with the pt  Thanks

## 2015-07-06 NOTE — Telephone Encounter (Signed)
Deborah Shields at 07/06/2015 9:03 AM     Status: Signed       Expand All Collapse All   Pt is calling in stating that she will be having a shoulder surgery (which has not been scheduled yet) and she would like to see Dr. Loletha Grayer as soon as possible to be cleared. I tried to offer the pt and appt with an APP but she said she would rather see Dr. Loletha Grayer. She wants to be squeezed in sometime next week. Please f/u with the pt  Thanks        Do you need to see her for an OV to clear her cardiologically?- mj

## 2015-07-07 ENCOUNTER — Ambulatory Visit (INDEPENDENT_AMBULATORY_CARE_PROVIDER_SITE_OTHER): Payer: Medicare Other | Admitting: Family

## 2015-07-07 ENCOUNTER — Encounter: Payer: Self-pay | Admitting: Family

## 2015-07-07 ENCOUNTER — Other Ambulatory Visit (INDEPENDENT_AMBULATORY_CARE_PROVIDER_SITE_OTHER): Payer: Medicare Other

## 2015-07-07 VITALS — BP 124/82 | HR 68 | Temp 97.7°F | Resp 16 | Ht 65.0 in | Wt 122.0 lb

## 2015-07-07 DIAGNOSIS — M75101 Unspecified rotator cuff tear or rupture of right shoulder, not specified as traumatic: Secondary | ICD-10-CM

## 2015-07-07 DIAGNOSIS — M75100 Unspecified rotator cuff tear or rupture of unspecified shoulder, not specified as traumatic: Secondary | ICD-10-CM | POA: Insufficient documentation

## 2015-07-07 DIAGNOSIS — I10 Essential (primary) hypertension: Secondary | ICD-10-CM | POA: Diagnosis not present

## 2015-07-07 DIAGNOSIS — I480 Paroxysmal atrial fibrillation: Secondary | ICD-10-CM

## 2015-07-07 DIAGNOSIS — Z01818 Encounter for other preprocedural examination: Secondary | ICD-10-CM | POA: Insufficient documentation

## 2015-07-07 DIAGNOSIS — M25519 Pain in unspecified shoulder: Secondary | ICD-10-CM | POA: Diagnosis not present

## 2015-07-07 DIAGNOSIS — E032 Hypothyroidism due to medicaments and other exogenous substances: Secondary | ICD-10-CM | POA: Diagnosis not present

## 2015-07-07 DIAGNOSIS — F5101 Primary insomnia: Secondary | ICD-10-CM | POA: Diagnosis not present

## 2015-07-07 LAB — CBC
HCT: 40 % (ref 36.0–46.0)
Hemoglobin: 12.9 g/dL (ref 12.0–15.0)
MCHC: 32.3 g/dL (ref 30.0–36.0)
MCV: 94.5 fl (ref 78.0–100.0)
Platelets: 239 10*3/uL (ref 150.0–400.0)
RBC: 4.24 Mil/uL (ref 3.87–5.11)
RDW: 14.9 % (ref 11.5–15.5)
WBC: 6.5 10*3/uL (ref 4.0–10.5)

## 2015-07-07 LAB — COMPREHENSIVE METABOLIC PANEL
ALT: 12 U/L (ref 0–35)
AST: 22 U/L (ref 0–37)
Albumin: 3.8 g/dL (ref 3.5–5.2)
Alkaline Phosphatase: 58 U/L (ref 39–117)
BUN: 26 mg/dL — ABNORMAL HIGH (ref 6–23)
CO2: 32 mEq/L (ref 19–32)
Calcium: 10.3 mg/dL (ref 8.4–10.5)
Chloride: 102 mEq/L (ref 96–112)
Creatinine, Ser: 1.65 mg/dL — ABNORMAL HIGH (ref 0.40–1.20)
GFR: 32.08 mL/min — ABNORMAL LOW (ref >60.00)
Glucose, Bld: 90 mg/dL (ref 70–99)
Potassium: 3.6 mEq/L (ref 3.5–5.1)
Sodium: 142 mEq/L (ref 135–145)
Total Bilirubin: 0.4 mg/dL (ref 0.2–1.2)
Total Protein: 6.8 g/dL (ref 6.0–8.3)

## 2015-07-07 LAB — PROTIME-INR
INR: 1 ratio (ref 0.8–1.0)
Prothrombin Time: 10.3 s (ref 9.6–13.1)

## 2015-07-07 NOTE — Patient Instructions (Addendum)
Thank you for choosing [facility].  Summary/Instructions:  Good luck with your surgery!  Please stop by the lab on the basement level of the building for your blood work. Your results will be released to [place] (or called to you) after review, usually within 72 hours after test completion. If any changes need to be made, you will be notified at that same time.

## 2015-07-07 NOTE — Progress Notes (Signed)
Pre visit review using our clinic review tool, if applicable. No additional management support is needed unless otherwise documented below in the visit note. 

## 2015-07-07 NOTE — Assessment & Plan Note (Signed)
Presents today for pre-operative clearance for right shoulder surgery. History and physical performed with review of labs. She does have chronic kidney disease Stage 3, however no symptoms of kidney dysfunction. Encourage adequate hydration. Recently seen by endocrinology and thyroid function is within normal limits. Pending cardiovascular clearance, patient is medically cleared for surgery.

## 2015-07-07 NOTE — Progress Notes (Signed)
Subjective:    Patient ID: Deborah Shields, female    DOB: Mar 19, 1941, 75 y.o.   MRN: [id number]  Chief Complaint  Patient presents with  . Pre-op Exam    surgical clearance, does not need EKG is going to cardiologist monday    HPI:  Deborah Shields is a 75 y.o. female who  has a past medical history of Diverticulosis of colon; Hypothyroidism; Hyperlipidemia; Hypertension; Microscopic hematuria; Degenerative joint disease; Peripheral vascular disease ([place]); Carotid artery occlusion; Heart murmur; Anxiety; Peripheral neuropathy ([place]); Aortic insufficiency; and Basal cell carcinoma (2007). and presents today for an office visit.  Patient is scheduled for surgery on her right shoulder. She has been experiencing the associated symptom of pain for located in her right shoulder that has been going on for several months. Modifying factors include failed attempts at cortisone injection and conservative treatments.   Allergies  Allergen Reactions  . Aspirin Nausea Only    REACTION: GI upset  ( pt can take 81 mg but NOT   325 mg ASA )  . Lovastatin Nausea Only    REACTION: nausea  . Benazepril Hcl   . Bupropion Hcl     REACTION: tinnitis  . Ezetimibe     REACTION: GI symptoms  . Fenofibrate     Myalgia   . Paroxetine   . Pravastatin Sodium Other (See Comments)    REACTION: elevated CPK - Muscle's in bilateral Leg     Outpatient Prescriptions Prior to Visit  Medication Sig Dispense Refill  . amLODipine (NORVASC) 5 MG tablet Take 1 tablet by mouth  daily 90 tablet 1  . aspirin 81 MG tablet Take 81 mg by mouth daily.      Marland Kitchen b complex vitamins tablet Take 1 tablet by mouth daily.      . Calcium Carbonate-Vitamin D (CALCIUM-VITAMIN D) 600-200 MG-UNIT CAPS Take by mouth.      . Cholecalciferol (VITAMIN D) 1000 UNITS capsule Take 1,000 Units by mouth daily.      . cycloSPORINE (RESTASIS) 0.05 % ophthalmic emulsion Place 1 drop into both eyes 2 (two) times daily.     [name] Sodium  (COLACE PO) Take by mouth as needed.    Marland Kitchen levothyroxine (SYNTHROID) 88 MCG tablet Take 88 mcg by mouth daily. Taking the brand "synthroid"    . LORazepam (ATIVAN) 0.5 MG tablet Take 1 tablet (0.5 mg total) by mouth 3 (three) times daily as needed. 90 tablet 0  . losartan (COZAAR) 100 MG tablet Take 0.5 tablets (50 mg total) by mouth daily. 45 tablet 1  . meclizine (ANTIVERT) 25 MG tablet Take 1 tablet (25 mg total) by mouth daily. 1/2 every 8 hrs prn for imbalance (Patient taking differently: Take 12.5 mg by mouth 3 (three) times daily as needed. 1/2 every 8 hrs prn for imbalance) 30 tablet 2  . metoprolol (LOPRESSOR) 50 MG tablet Take one-half tablet by  mouth twice a day 90 tablet 1  . Multiple Vitamins-Minerals (CENTRUM SILVER PO) Take by mouth.      . traMADol (ULTRAM) 50 MG tablet 1/2-1 by mouth once daily as needed. 90 tablet 0  . vitamin E 100 UNIT capsule Take 100 Units by mouth daily.     . benzonatate (TESSALON) 200 MG capsule Take 1 capsule (200 mg total) by mouth 3 (three) times daily as needed for cough. 15 capsule 0  . azithromycin (ZITHROMAX) 250 MG tablet 1 pill daily 6 tablet 0   No facility-administered medications  prior to visit.     Past Medical History  Diagnosis Date  . Diverticulosis of colon   . Hypothyroidism     Dr Wilson Singer  . Hyperlipidemia     NMR 07/2009: LDL 200 (2260/1233)TG 99, HDL 65. LDL goal =<120, ideally <100. father MI @ 13  . Hypertension   . Microscopic hematuria   . Degenerative joint disease   . Peripheral vascular disease (HCC)     ICA bilat, Dr.Charles fields, [facility]  . Carotid artery occlusion   . Heart murmur   . Anxiety   . Peripheral neuropathy ([facility])     compressive in UE bilaterally; Dr Daylene Katayama  . Aortic insufficiency     mild due to degenerative changes  . Basal cell carcinoma 2007    [place] [facility] Left leg     Past Surgical History  Procedure Laterality Date  . Abdominal hysterectomy  1973    age 62 due to dysfuctional menses; HRT  x 25-30 years  . Bilateral oophorectomy  1990    prophylactically (sister had ovarian ca)  . Appendectomy  1952  . Cholecystectomy  2006  . Tonsillectomy  1957  . Orthopedic surgery  1989/200/2002/2012    elbows,shoulder surgery x 3, right hand  . Carpal tunnel release  1989    right  . Basal cell cancer  03/2006    leg  . Cataract extraction, bilateral  2010    Dr Kathrin Penner  . US echocardiography  05/01/2010    trace MR,AI,TR;EF =>55%  . Nm myoview ltd  06/13/2008    low risk scan  . Eye surgery      Bilateral eye  . Colonoscopy      Dr Ardis Hughs     Family History  Problem Relation Age of Onset  . Heart attack Father 25  . Heart disease Father     Before age 38  . Colon cancer Mother 91  . Stroke Mother 77  . Kidney disease Mother     ? renal calculi; S/P resecton of kidney  . Cancer Mother 27    Colon  . Ovarian cancer Sister   . Other Sister     Valve replacement (aortic & mitral ) in 2 sisters  . Heart disease Sister     Before age 33  . Heart disease Brother     aortic & mitral valve replacement in 2 bro; both had CBAG  . Diabetes Neg Hx      Social History   Social History  . Marital Status: Widowed    Spouse Name: N/A  . Number of Children: N/A  . Years of Education: N/A   Occupational History  . Not on file.   Social History Main Topics  . Smoking status: Never Smoker   . Smokeless tobacco: Never Used  . Alcohol Use: No  . Drug Use: No  . Sexual Activity: Not on file   Other Topics Concern  . Not on file   Social History Narrative   Low cholesterol diet   Regular exercise- yes        Review of Systems   Constitutional: Denies fever, chills, fatigue, or significant weight gain/loss. HENT: Head: Denies headache or neck pain Ears: Denies changes in hearing, ringing in ears, earache, drainage Nose: Denies discharge, stuffiness, itching, nosebleed, sinus pain Throat: Denies sore throat, hoarseness, dry mouth, sores, thrush Eyes:  Denies loss/changes in vision, pain, redness, blurry/double vision, flashing lights Cardiovascular: Denies chest pain/discomfort, tightness, palpitations, shortness of breath  with activity, difficulty lying down, swelling, sudden awakening with shortness of breath Respiratory: Denies shortness of breath, cough, sputum production, wheezing Gastrointestinal: Denies dysphasia, heartburn, change in appetite, nausea, change in bowel habits, rectal bleeding, constipation, diarrhea, yellow skin or eyes Genitourinary: Denies frequency, urgency, burning/pain, blood in urine, incontinence, change in urinary strength. Musculoskeletal: Denies muscle/joint pain, stiffness, back pain, redness or swelling of joints, trauma Skin: Denies rashes, lumps, itching, dryness, color changes, or hair/nail changes Neurological: Denies dizziness, fainting, seizures, weakness, numbness, tingling, tremor Psychiatric - Denies nervousness, stress, depression or memory loss Endocrine: Denies heat or cold intolerance, sweating, frequent urination, excessive thirst, changes in appetite Hematologic: Denies ease of bruising or bleeding  Objective:    BP 124/82 mmHg  Pulse 68  Temp(Src) 97.7 F (36.5 C) (Oral)  Resp 16  Ht 5\' 5"  (1.651 m)  Wt 122 lb (55.339 kg)  BMI 20.30 kg/m2  SpO2 94% Nursing note and vital signs reviewed.  Physical Exam  Constitutional: She is oriented to person, place, and time. She appears well-developed and well-nourished.  HENT:  Head: Normocephalic.  Right Ear: Hearing, tympanic membrane, external ear and ear canal normal.  Left Ear: Hearing, tympanic membrane, external ear and ear canal normal.  Nose: Nose normal.  Mouth/Throat: Uvula is midline, oropharynx is clear and moist and mucous membranes are normal.  Eyes: Conjunctivae and EOM are normal. Pupils are equal, round, and reactive to light.  Neck: Neck supple. No JVD present. No tracheal deviation present. No thyromegaly present.    Cardiovascular: Normal rate, regular rhythm, normal heart sounds and intact distal pulses.   Pulmonary/Chest: Effort normal and breath sounds normal.  Abdominal: Soft. Bowel sounds are normal. She exhibits no distension and no mass. There is no tenderness. There is no rebound and no guarding.  Musculoskeletal: Normal range of motion. She exhibits edema and tenderness.  Right shoulder - No obvious deformity, discoloration or edema of right shoulder noted. There is tenderness over the supraspinatus tendon and biceps tendon. Range of motion is restricted to less than 90 degrees of flexion and abduction. There is significant weakness of the rotator cuff musculature. Distal pulses are intact and appropriate.   Lymphadenopathy:    She has no cervical adenopathy.  Neurological: She is alert and oriented to person, place, and time. She has normal reflexes. No cranial nerve deficit. She exhibits normal muscle tone. Coordination normal.  Skin: Skin is warm and dry.  Psychiatric: She has a normal mood and affect. Her behavior is normal. Judgment and thought content normal.       Assessment & Plan:   Problem List Items Addressed This Visit      Cardiovascular and Mediastinum   PAF (paroxysmal atrial fibrillation) ([place])   Relevant Orders   INR/PT (Completed)     Musculoskeletal and Integument   Rotator cuff tear - Primary   Relevant Orders   CBC (Completed)   Comprehensive metabolic panel (Completed)     Other   Pre-operative clearance    Presents today for pre-operative clearance for right shoulder surgery. History and physical performed with review of labs. She does have chronic kidney disease Stage 3, however no symptoms of kidney dysfunction. Encourage adequate hydration. Recently seen by endocrinology and thyroid function is within normal limits. Pending cardiovascular clearance, patient is medically cleared for surgery.       Relevant Orders   CBC (Completed)   Comprehensive metabolic  panel (Completed)

## 2015-07-10 ENCOUNTER — Ambulatory Visit (INDEPENDENT_AMBULATORY_CARE_PROVIDER_SITE_OTHER): Payer: Medicare Other | Admitting: Cardiology

## 2015-07-10 ENCOUNTER — Encounter: Payer: Self-pay | Admitting: Family

## 2015-07-10 ENCOUNTER — Encounter: Payer: Self-pay | Admitting: Cardiology

## 2015-07-10 VITALS — BP 130/82 | HR 65 | Ht 66.5 in | Wt 120.4 lb

## 2015-07-10 DIAGNOSIS — I480 Paroxysmal atrial fibrillation: Secondary | ICD-10-CM | POA: Diagnosis not present

## 2015-07-10 DIAGNOSIS — Z01810 Encounter for preprocedural cardiovascular examination: Secondary | ICD-10-CM | POA: Diagnosis not present

## 2015-07-10 DIAGNOSIS — I6523 Occlusion and stenosis of bilateral carotid arteries: Secondary | ICD-10-CM

## 2015-07-10 DIAGNOSIS — I10 Essential (primary) hypertension: Secondary | ICD-10-CM | POA: Diagnosis not present

## 2015-07-10 NOTE — Progress Notes (Signed)
07/10/2015 Deborah Shields   10-01-1940  [id number]  Primary Physician Deborah Rail, MD Primary Cardiologist: Dr Deborah Shields  HPI:  75 y/o female followed by Dr Deborah Shields. She had PAF post op in 2012 when she had shoulder surgery. She has not had recurrence. She is here today for pre op clearance prior to proposed shoulder surgery by Dr Deborah Shields.  The pt denies any palpitations, chest pain, syncope, or unusual dyspnea. Her echo and Myoview were low risk in Sept 2015.   Current Outpatient Prescriptions  Medication Sig Dispense Refill  . amLODipine (NORVASC) 5 MG tablet Take 1 tablet by mouth  daily 90 tablet 1  . aspirin 81 MG tablet Take 81 mg by mouth daily.      Marland Kitchen b complex vitamins tablet Take 1 tablet by mouth daily.      . Calcium Carbonate-Vitamin D (CALCIUM-VITAMIN D) 600-200 MG-UNIT CAPS Take by mouth.      . Cholecalciferol (VITAMIN D) 1000 UNITS capsule Take 1,000 Units by mouth daily.      . cycloSPORINE (RESTASIS) 0.05 % ophthalmic emulsion Place 1 drop into both eyes 2 (two) times daily.     Deborah Shields Sodium (COLACE PO) Take by mouth as needed.    Marland Kitchen levothyroxine (SYNTHROID) 88 MCG tablet Take 88 mcg by mouth daily. Taking the brand "synthroid"    . LORazepam (ATIVAN) 0.5 MG tablet Take 1 tablet (0.5 mg total) by mouth 3 (three) times daily as needed. 90 tablet 0  . losartan (COZAAR) 100 MG tablet Take 0.5 tablets (50 mg total) by mouth daily. 45 tablet 1  . meclizine (ANTIVERT) 25 MG tablet Take 1 tablet (25 mg total) by mouth daily. 1/2 every 8 hrs prn for imbalance (Patient taking differently: Take 12.5 mg by mouth 3 (three) times daily as needed. 1/2 every 8 hrs prn for imbalance) 30 tablet 2  . metoprolol (LOPRESSOR) 50 MG tablet Take one-half tablet by  mouth twice a day 90 tablet 1  . Multiple Vitamins-Minerals (CENTRUM SILVER PO) Take by mouth.      . traMADol (ULTRAM) 50 MG tablet 1/2-1 by mouth once daily as needed. 90 tablet 0  . vitamin E 100 UNIT capsule Take 100  Units by mouth daily.      No current facility-administered medications for this visit.    Allergies  Allergen Reactions  . Aspirin Nausea Only    REACTION: GI upset  ( pt can take 81 mg but NOT   325 mg ASA )  . Lovastatin Nausea Only    REACTION: nausea  . Benazepril Hcl   . Bupropion Hcl     REACTION: tinnitis  . Ezetimibe     REACTION: GI symptoms  . Fenofibrate     Myalgia   . Paroxetine   . Pravastatin Sodium Other (See Comments)    REACTION: elevated CPK - Muscle's in bilateral Leg    Social History   Social History  . Marital Status: Widowed    Spouse Name: N/A  . Number of Children: N/A  . Years of Education: N/A   Occupational History  . Not on file.   Social History Main Topics  . Smoking status: Never Smoker   . Smokeless tobacco: Never Used  . Alcohol Use: No  . Drug Use: No  . Sexual Activity: Not on file   Other Topics Concern  . Not on file   Social History Narrative   Low cholesterol diet   Regular exercise-  yes      Review of Systems: General: negative for chills, fever, night sweats or weight changes.  Cardiovascular: negative for chest pain, dyspnea on exertion, edema, orthopnea, palpitations, paroxysmal nocturnal dyspnea or shortness of breath Dermatological: negative for rash Respiratory: negative for cough or wheezing Urologic: negative for hematuria Abdominal: negative for nausea, vomiting, diarrhea, bright red blood per rectum, melena, or hematemesis Neurologic: negative for visual changes, syncope, or dizziness All other systems reviewed and are otherwise negative except as noted above.    Blood pressure 130/82, pulse 65, height 5' 6.5" (1.689 m), weight 120 lb 6.4 oz (54.613 kg).  General appearance: alert, cooperative, no distress and thin Neck: no carotid bruit and no JVD Lungs: clear to auscultation bilaterally Heart: regular rate and rhythm Extremities: extremities normal, atraumatic, no cyanosis or edema Skin: Skin  color, texture, turgor normal. No rashes or lesions Neurologic: Grossly normal  EKG NSR, NSST changes  Echo Sept 2015 Study Conclusions  - Left ventricle: The cavity size was normal. Wall thickness was normal. Systolic function was normal. The estimated ejection fraction was in the range of 60% to 65%. Wall motion was normal; there were no regional wall motion abnormalities. Doppler parameters are consistent with abnormal left ventricular relaxation (grade 1 diastolic dysfunction). The E/e&' ratio is between 8-15, suggesting indeterminate LV Filling pressure. - Aortic valve: Trileaflet. Sclerosis without stenosis. There was trivial regurgitation. - Left atrium: LA volume/ BSA = 35 ml/m2. The atrium was mildly dilated. - Tricuspid valve: There was mild regurgitation. - Pulmonary arteries: PA peak pressure: 28 mm Hg (S). - Inferior vena cava: The vessel was normal in size. The respirophasic diameter changes were in the normal range (= 50%), consistent with normal central venous pressure.  Impressions:  - LVEF 60-65%, normal wall thickness, normal wall motion, diastolic dysfunction, indeterminate filling pressure, aortic sclerosis with trivial AI, mild LAE.  Myoview Sept 2015 Overall Impression: Normal stress nuclear study.  LV Wall Motion: NL LV Function, EF 67%; NL Wall Motion    ASSESSMENT AND PLAN:   PAF (paroxysmal atrial fibrillation) (HCC) Post op shoulder surgery in 2012- no recurrence  Encounter for pre-operative cardiovascular clearance Proposed shoulder surgery with Dr Deborah Shields  Carotid artery stenosis, asymptomatic 1-49% by doppler April 2016  Essential hypertension Controlled   PLAN  She si an acceptable risk for proposed shoulder surgery. We will be available peri-op as needed.   Deborah Shields K PA-C 07/10/2015 2:52 PM

## 2015-07-10 NOTE — Patient Instructions (Signed)
Your physician recommends that you schedule a follow-up appointment in: September 2017 WITH DR. Sallyanne Kuster

## 2015-07-10 NOTE — Assessment & Plan Note (Signed)
Post op shoulder surgery in 2012- no recurrence

## 2015-07-10 NOTE — Assessment & Plan Note (Signed)
Proposed shoulder surgery with Dr Veverly Fells

## 2015-07-10 NOTE — Assessment & Plan Note (Signed)
Controlled.  

## 2015-07-10 NOTE — Assessment & Plan Note (Signed)
1-49% by doppler April 2016

## 2015-07-17 ENCOUNTER — Ambulatory Visit: Payer: Medicare Other | Admitting: Internal Medicine

## 2015-07-17 ENCOUNTER — Telehealth: Payer: Self-pay | Admitting: *Deleted

## 2015-07-17 ENCOUNTER — Encounter: Payer: Self-pay | Admitting: Cardiovascular Disease

## 2015-07-17 NOTE — Telephone Encounter (Signed)
Requesting surgical clearance:   1. Type of surgery: RIGHT SHOULDER SCOPE, A-SAD, BI TENODESIS, POSS RCR/SUBSCAP,BICEPS TENODESIS-OPEN,SA-SAD-SCOPE W/PARTIAL ACROMIOPLASATY W/WO CORACOACROMIAL RELEASE  2. Surgeon: Esmond Plants, MD   3. Surgical date: PENDING CLEARANCE  4. Medications that need to be held: ASA 81 MG

## 2015-07-25 DIAGNOSIS — M75111 Incomplete rotator cuff tear or rupture of right shoulder, not specified as traumatic: Secondary | ICD-10-CM | POA: Diagnosis not present

## 2015-07-25 DIAGNOSIS — S43431D Superior glenoid labrum lesion of right shoulder, subsequent encounter: Secondary | ICD-10-CM | POA: Diagnosis not present

## 2015-08-08 ENCOUNTER — Ambulatory Visit: Payer: Medicare Other | Admitting: Internal Medicine

## 2015-08-08 DIAGNOSIS — R0989 Other specified symptoms and signs involving the circulatory and respiratory systems: Secondary | ICD-10-CM | POA: Diagnosis not present

## 2015-08-08 DIAGNOSIS — J329 Chronic sinusitis, unspecified: Secondary | ICD-10-CM | POA: Diagnosis not present

## 2015-08-08 DIAGNOSIS — R05 Cough: Secondary | ICD-10-CM | POA: Diagnosis not present

## 2015-08-15 ENCOUNTER — Encounter: Payer: Self-pay | Admitting: Internal Medicine

## 2015-08-15 ENCOUNTER — Ambulatory Visit (INDEPENDENT_AMBULATORY_CARE_PROVIDER_SITE_OTHER): Payer: Medicare Other | Admitting: Internal Medicine

## 2015-08-15 VITALS — BP 128/82 | HR 57 | Temp 97.7°F | Resp 16 | Wt 117.0 lb

## 2015-08-15 DIAGNOSIS — E782 Mixed hyperlipidemia: Secondary | ICD-10-CM

## 2015-08-15 DIAGNOSIS — Z0000 Encounter for general adult medical examination without abnormal findings: Secondary | ICD-10-CM | POA: Diagnosis not present

## 2015-08-15 DIAGNOSIS — M8949 Other hypertrophic osteoarthropathy, multiple sites: Secondary | ICD-10-CM

## 2015-08-15 DIAGNOSIS — Z23 Encounter for immunization: Secondary | ICD-10-CM

## 2015-08-15 DIAGNOSIS — E038 Other specified hypothyroidism: Secondary | ICD-10-CM

## 2015-08-15 DIAGNOSIS — I6523 Occlusion and stenosis of bilateral carotid arteries: Secondary | ICD-10-CM | POA: Diagnosis not present

## 2015-08-15 DIAGNOSIS — I10 Essential (primary) hypertension: Secondary | ICD-10-CM

## 2015-08-15 DIAGNOSIS — F411 Generalized anxiety disorder: Secondary | ICD-10-CM

## 2015-08-15 DIAGNOSIS — N289 Disorder of kidney and ureter, unspecified: Secondary | ICD-10-CM

## 2015-08-15 DIAGNOSIS — M150 Primary generalized (osteo)arthritis: Secondary | ICD-10-CM

## 2015-08-15 DIAGNOSIS — M159 Polyosteoarthritis, unspecified: Secondary | ICD-10-CM

## 2015-08-15 DIAGNOSIS — F419 Anxiety disorder, unspecified: Secondary | ICD-10-CM

## 2015-08-15 MED ORDER — LORAZEPAM 0.5 MG PO TABS: 0.5000 mg | ORAL_TABLET | Freq: Three times a day (TID) | ORAL | Status: DC | PRN

## 2015-08-15 MED ORDER — TRAMADOL HCL 50 MG PO TABS: ORAL_TABLET | ORAL | Status: DC

## 2015-08-15 MED ORDER — LOSARTAN POTASSIUM 100 MG PO TABS: 50.0000 mg | ORAL_TABLET | Freq: Every day | ORAL | Status: DC

## 2015-08-15 MED ORDER — AMLODIPINE BESYLATE 5 MG PO TABS: ORAL_TABLET | ORAL | Status: DC

## 2015-08-15 NOTE — Assessment & Plan Note (Signed)
Has been stable

## 2015-08-15 NOTE — Assessment & Plan Note (Signed)
Well controlled. Continue current medication.  

## 2015-08-15 NOTE — Progress Notes (Signed)
Subjective:    Patient ID: Deborah Shields, female    DOB: 06/02/1941, 75 y.o.   MRN: [id number]  HPI Here for medicare wellness.   She had pneumonia last week.  She was sweating at night, didn't feel well.  She ended up seeing her endocrinologist and he treated her.  She had a steroid injection, antibiotic injection, zpak and tessalon pereles.   She is better.  She still has sob slightly tinted phelgm a few times a day. She feels weak and still has some mild sob.    She had rectocele surgery last year.  She has had constipation since then.  She eats [place] and takes two stool softeners since then.  No change in diet or meds.  She can have a BM every 3-4 days sometimes. The stool is large and she has to strain.  I have personally reviewed and have noted 1.The patient's medical and social history 2.Their use of alcohol, tobacco or illicit drugs 3.Their current medications and supplements 4.The patient's functional ability including ADL's, fall risks, home safety risks and                 hearing or visual impairment. 5.Diet and physical activities 6.Evidence for depression or mood disorders 7.Care team reviewed and updated (available in snapshot)   Are there smokers in your home (other than you)? No  Risk Factors Exercise: very active, enrolled in balance exercise, will start pool exercises - does this off and on Dietary issues discussed: well balanced diet  Cardiac risk factors: advanced age (older than 95 for men, 49 for women), hypertension, hyperlipidemia, and obesity (BMI >= 30 kg/m2).  Depression Screen  Have you felt down, depressed or hopeless? No  Have you felt little interest or pleasure in doing things?  No Activities of Daily Living In your present state of health, do you have any difficulty performing the following activities?:  Driving? Yes Managing money?  Yes Feeding yourself? Yes Getting from bed  to chair? Yes Climbing a flight of stairs? Yes Preparing food and eating?: Yes Bathing or showering? Yes Getting dressed: Yes Getting to/using the toilet? Yes Moving around from place to place: Yes In the past year have you fallen or had a near fall?: No   Are you sexually active?  No  Do you have more than one partner?  N/A  Hearing Difficulties: No Do you often ask people to speak up or repeat themselves? No Do you experience ringing or noises in your ears? No Do you have difficulty understanding soft or whispered voices? No Vision:              Any change in vision: no             Up to date with eye exam:  no Memory:  Do you feel that you have a problem with memory? No  Do you often misplace items? No  Do you feel safe at home?  Yes  Cognitive Testing  Alert, Orientated? Yes  Normal Appearance? Yes  Recall of three objects?  Yes  Can perform simple calculations? Yes  Displays appropriate judgment? Yes  Can read the correct time from a watch face? Yes   Advanced Directives have been discussed with the patient? Yes  Medications and allergies reviewed with patient and updated if appropriate.  Patient Active Problem List   Diagnosis Date Noted  . Encounter for pre-operative cardiovascular clearance 07/10/2015  . Pre-operative clearance 07/07/2015  . Rotator cuff  tear 07/07/2015  . Rectocele, female 08/04/2014  . Dyspnea on exertion 02/14/2014  . DOE (dyspnea on exertion) 12/31/2013  . Other malaise and fatigue 12/31/2013  . PAF (paroxysmal atrial fibrillation) ([place]) 01/10/2013  . Thrombosed external hemorrhoid 11/26/2012  . Renal insufficiency 09/03/2011  . Atherosclerosis of native arteries of the extremities with intermittent claudication 04/04/2011  . Occlusion and stenosis of carotid artery without mention of cerebral infarction 04/04/2011  . PERIPHERAL NEUROPATHY 08/15/2008  . Hypothyroidism 08/12/2007  . HYPERLIPIDEMIA 08/12/2007  . Essential hypertension  08/12/2007  . Osteopenia 08/12/2007  . Carotid artery stenosis, asymptomatic 08/11/2007  . DEGENERATIVE JOINT DISEASE 08/11/2007  . BASAL CELL CARCINOMA, HX OF 08/11/2007  . FIBROCYSTIC BREAST DISEASE 11/18/2006  . DIVERTICULOSIS, COLON 08/10/2006    Current Outpatient Prescriptions on File Prior to Visit  Medication Sig Dispense Refill  . amLODipine (NORVASC) 5 MG tablet Take 1 tablet by mouth  daily 90 tablet 1  . aspirin 81 MG tablet Take 81 mg by mouth daily.      Marland Kitchen b complex vitamins tablet Take 1 tablet by mouth daily.      . Calcium Carbonate-Vitamin D (CALCIUM-VITAMIN D) 600-200 MG-UNIT CAPS Take by mouth.      . Cholecalciferol (VITAMIN D) 1000 UNITS capsule Take 1,000 Units by mouth daily.      . cycloSPORINE (RESTASIS) 0.05 % ophthalmic emulsion Place 1 drop into both eyes 2 (two) times daily.     [name] Sodium (COLACE PO) Take by mouth as needed.    Marland Kitchen levothyroxine (SYNTHROID) 88 MCG tablet Take 88 mcg by mouth daily. Taking the brand "synthroid"    . LORazepam (ATIVAN) 0.5 MG tablet Take 1 tablet (0.5 mg total) by mouth 3 (three) times daily as needed. 90 tablet 0  . losartan (COZAAR) 100 MG tablet Take 0.5 tablets (50 mg total) by mouth daily. 45 tablet 1  . meclizine (ANTIVERT) 25 MG tablet Take 1 tablet (25 mg total) by mouth daily. 1/2 every 8 hrs prn for imbalance (Patient taking differently: Take 12.5 mg by mouth 3 (three) times daily as needed. 1/2 every 8 hrs prn for imbalance) 30 tablet 2  . metoprolol (LOPRESSOR) 50 MG tablet Take one-half tablet by  mouth twice a day 90 tablet 1  . Multiple Vitamins-Minerals (CENTRUM SILVER PO) Take by mouth.      . traMADol (ULTRAM) 50 MG tablet 1/2-1 by mouth once daily as needed. 90 tablet 0  . vitamin E 100 UNIT capsule Take 100 Units by mouth daily.      No current facility-administered medications on file prior to visit.    Past Medical History  Diagnosis Date  . Diverticulosis of colon   . Hypothyroidism     Dr  Wilson Singer  . Hyperlipidemia     NMR 07/2009: LDL 200 (2260/1233)TG 99, HDL 65. LDL goal =<120, ideally <100. father MI @ 28  . Hypertension   . Microscopic hematuria   . Degenerative joint disease   . Peripheral vascular disease (HCC)     ICA bilat, Dr.Charles fields, [facility]  . Carotid artery occlusion   . Heart murmur   . Anxiety   . Peripheral neuropathy (HCC)     compressive in UE bilaterally; Dr Daylene Katayama  . Aortic insufficiency     mild due to degenerative changes  . Basal cell carcinoma 2007    [facility] Left leg    Past Surgical History  Procedure Laterality Date  . Abdominal hysterectomy  1973  age 3 due to dysfuctional menses; HRT x 25-30 years  . Bilateral oophorectomy  1990    prophylactically (sister had ovarian ca)  . Appendectomy  1952  . Cholecystectomy  2006  . Tonsillectomy  1957  . Orthopedic surgery  1989/200/2002/2012    elbows,shoulder surgery x 3, right hand  . Carpal tunnel release  1989    right  . Basal cell cancer  03/2006    leg  . Cataract extraction, bilateral  2010    Dr Kathrin Penner  . US echocardiography  05/01/2010    trace MR,AI,TR;EF =>55%  . Nm myoview ltd  06/13/2008    low risk scan  . Eye surgery      Bilateral eye  . Colonoscopy      Dr Ardis Hughs    Social History   Social History  . Marital Status: Widowed    Spouse Name: N/A  . Number of Children: N/A  . Years of Education: N/A   Social History Main Topics  . Smoking status: Never Smoker   . Smokeless tobacco: Never Used  . Alcohol Use: No  . Drug Use: No  . Sexual Activity: Not on file   Other Topics Concern  . Not on file   Social History Narrative   Low cholesterol diet   Regular exercise- yes     Family History  Problem Relation Age of Onset  . Heart attack Father 21  . Heart disease Father     Before age 21  . Colon cancer Mother 79  . Stroke Mother 37  . Kidney disease Mother     ? renal calculi; S/P resecton of kidney  . Cancer Mother 10    Colon    . Ovarian cancer Sister   . Other Sister     Valve replacement (aortic & mitral ) in 2 sisters  . Heart disease Sister     Before age 78  . Heart disease Brother     aortic & mitral valve replacement in 2 bro; both had CBAG  . Diabetes Neg Hx     Review of Systems  Constitutional: Negative for fever, chills, appetite change and fatigue.  HENT: Negative for hearing loss.   Eyes: Negative for visual disturbance.  Respiratory: Positive for cough and shortness of breath. Negative for wheezing.   Cardiovascular: Positive for palpitations (rare). Negative for chest pain and leg swelling.  Gastrointestinal: Positive for constipation. Negative for nausea, abdominal pain and blood in stool.       No GERD  Genitourinary: Negative for dysuria and hematuria.  Musculoskeletal: Positive for arthralgias (hand arthritis). Negative for back pain.  Skin: Negative for rash.  Neurological: Negative for dizziness, light-headedness and headaches.  Psychiatric/Behavioral: Negative for dysphoric mood. The patient is not nervous/anxious.        Objective:   Filed Vitals:   08/15/15 0904  BP: 128/82  Pulse: 57  Temp: 97.7 F (36.5 C)  Resp: 16   Filed Weights   08/15/15 0904  Weight: 117 lb (53.071 kg)   Body mass index is 18.6 kg/(m^2).   Physical Exam Constitutional: She appears well-developed and well-nourished. No distress.  HENT:  Head: Normocephalic and atraumatic.  Right Ear: External ear normal. Normal ear canal and TM Left Ear: External ear normal.  Normal ear canal and TM Mouth/Throat: Oropharynx is clear and moist.  Normal bilateral ear canals and tympanic membranes  Eyes: Conjunctivae and EOM are normal.  Neck: Neck supple. No tracheal deviation present. No thyromegaly  present.  No carotid bruit  Cardiovascular: Normal rate, regular rhythm and normal heart sounds.   No murmur heard.  No edema. Pulmonary/Chest: Effort normal and breath sounds normal. No respiratory distress.  She has no wheezes. She has no rales.  Breast: deferred to Gyn Abdominal: Soft. She exhibits no distension. There is no tenderness.  Lymphadenopathy: She has no cervical adenopathy.  Skin: Skin is warm and dry. She is not diaphoretic.  Psychiatric: She has a normal mood and affect. Her behavior is normal.         Assessment & Plan:   Wellness Exam Immunizations prevnar due - given today, other vaccines up to date EKG up to date Colonoscopy  Up to date Mammogram up to date dexa up to date Eye exam up to date Hearing loss no Sees derm annually for a skin check Memory concerns/difficulties - no Independent of ADLs - yes   See Problem List for Assessment and Plan of chronic medical problems.  Deferred blood work.  tsh checked by endocrine.  Does not want to take a statin and not concerned about cholesterol.  May consider a study with cardiology - new cholesterol lowering medication.

## 2015-08-15 NOTE — Assessment & Plan Note (Signed)
Takes tramadol only as needed-typically only takes at night

## 2015-08-15 NOTE — Progress Notes (Signed)
Pre visit review using our clinic review tool, if applicable. No additional management support is needed unless otherwise documented below in the visit note. 

## 2015-08-15 NOTE — Patient Instructions (Addendum)
You can maximize the stool softener - you can take up to 300 mg of docusate / colace daily.  Try taking a probiotic pill daily.    Deborah Shields , Thank you for taking time to come for your Medicare Wellness Visit. I appreciate your ongoing commitment to your health goals. Please review the following plan we discussed and let me know if I can assist you in the future.   These are the goals we discussed: Goals    None      This is a list of the screening recommended for you and due dates:  Health Maintenance  Topic Date Due  . Pneumonia vaccines (2 of 2 - PCV13) 09/02/2012  --given today  . Flu Shot  01/23/2016  . Colon Cancer Screening  05/30/2016  . Tetanus Vaccine  02/19/2021  . DEXA scan (bone density measurement)  Completed  . Shingles Vaccine  Completed    We have reviewed your prior records including labs and tests today.  Test(s) ordered today. Your results will be released to [facility] (or called to you) after review, usually within 72hours after test completion. If any changes need to be made, you will be notified at that same time.  All other Health Maintenance issues reviewed.   All recommended immunizations and age-appropriate screenings are up-to-date.  prevnar vaccine administered today.   Medications reviewed and updated.  o changes recommended at this time.  Your prescription(s) have been submitted to your pharmacy. Please take as directed and contact our office if you believe you are having problem(s) with the medication(s).

## 2015-08-15 NOTE — Assessment & Plan Note (Signed)
Takes Ativan nightly for anxiety to help her sleep Discussed concerns with long-term use Will continue

## 2015-08-15 NOTE — Assessment & Plan Note (Signed)
Follows with endocrine

## 2015-09-18 DIAGNOSIS — M1990 Unspecified osteoarthritis, unspecified site: Secondary | ICD-10-CM | POA: Diagnosis not present

## 2015-09-18 DIAGNOSIS — M2550 Pain in unspecified joint: Secondary | ICD-10-CM | POA: Diagnosis not present

## 2015-09-18 DIAGNOSIS — M8589 Other specified disorders of bone density and structure, multiple sites: Secondary | ICD-10-CM | POA: Diagnosis not present

## 2015-09-18 DIAGNOSIS — R768 Other specified abnormal immunological findings in serum: Secondary | ICD-10-CM | POA: Diagnosis not present

## 2015-09-18 DIAGNOSIS — M19049 Primary osteoarthritis, unspecified hand: Secondary | ICD-10-CM | POA: Diagnosis not present

## 2015-09-20 ENCOUNTER — Encounter: Payer: Self-pay | Admitting: Internal Medicine

## 2015-10-16 ENCOUNTER — Telehealth: Payer: Self-pay | Admitting: *Deleted

## 2015-10-16 DIAGNOSIS — I10 Essential (primary) hypertension: Secondary | ICD-10-CM

## 2015-10-16 DIAGNOSIS — F411 Generalized anxiety disorder: Secondary | ICD-10-CM

## 2015-10-16 MED ORDER — METOPROLOL TARTRATE 50 MG PO TABS
ORAL_TABLET | ORAL | Status: DC
Start: 2015-10-16 — End: 2016-11-25

## 2015-10-16 NOTE — Telephone Encounter (Signed)
Ok to give 6 months

## 2015-10-16 NOTE — Telephone Encounter (Signed)
Receive call pt states when she was at her yearly physical she needed to get refills on her metoprolol, tramadol, and lorazepam she is requesting year supply to be sent to [facility]. Inform pt we can only do up to 6 months for controls, and have to get those approve by MD. Will go ahead and send metoprolol. Pls advise on controls...Johny Chess

## 2015-10-17 MED ORDER — TRAMADOL HCL 50 MG PO TABS: ORAL_TABLET | ORAL | Status: DC

## 2015-10-17 MED ORDER — LORAZEPAM 0.5 MG PO TABS: 0.5000 mg | ORAL_TABLET | Freq: Three times a day (TID) | ORAL | Status: DC | PRN

## 2015-10-17 NOTE — Telephone Encounter (Signed)
Notified pt medications has been sent to [facility]...Deborah Shields

## 2015-10-20 ENCOUNTER — Encounter: Payer: Self-pay | Admitting: Family

## 2015-10-26 ENCOUNTER — Encounter: Payer: Self-pay | Admitting: Family

## 2015-10-26 ENCOUNTER — Ambulatory Visit: Payer: Medicare Other | Admitting: Family

## 2015-10-26 ENCOUNTER — Ambulatory Visit (INDEPENDENT_AMBULATORY_CARE_PROVIDER_SITE_OTHER): Payer: Medicare Other | Admitting: Family

## 2015-10-26 ENCOUNTER — Ambulatory Visit (HOSPITAL_COMMUNITY)
Admission: RE | Admit: 2015-10-26 | Discharge: 2015-10-26 | Disposition: A | Discharge status: Home / Self Care | Payer: Medicare Other | Source: Ambulatory Visit | Attending: Family | Admitting: Family

## 2015-10-26 ENCOUNTER — Encounter (HOSPITAL_COMMUNITY): Payer: Medicare Other

## 2015-10-26 VITALS — BP 89/65 | HR 56 | Temp 97.9°F | Resp 14 | Ht 66.5 in | Wt 120.0 lb

## 2015-10-26 DIAGNOSIS — I6523 Occlusion and stenosis of bilateral carotid arteries: Secondary | ICD-10-CM | POA: Insufficient documentation

## 2015-10-26 DIAGNOSIS — I10 Essential (primary) hypertension: Secondary | ICD-10-CM | POA: Diagnosis not present

## 2015-10-26 DIAGNOSIS — R785 Finding of other psychotropic drug in blood: Secondary | ICD-10-CM | POA: Diagnosis not present

## 2015-10-26 DIAGNOSIS — I773 Arterial fibromuscular dysplasia: Secondary | ICD-10-CM

## 2015-10-26 NOTE — Progress Notes (Signed)
Chief Complaint: Follow up Extracranial Carotid Artery Stenosis   History of Present Illness  Deborah Shields is a 75 y.o. female patient of Dr. [name] who has known right ICA tortuosity and left ICA is c/w fibromuscular dysplasia. She returns today for scheduled surveillance.  Patient denies ever having a stroke or TIA symptoms. Specifically the patient denies a history of: amaurosis fugax or monocular blindness, facial drooping, hemiplegia, or receptive or expressive aphasia.  She denies claudication, denies non-healing wounds.   Sees a cardiologist, Dr. Orene Desanctis, due to family hx of CAD and personal hx of murmur.  Patient reports New Medical or Surgical History: right deltoid muscle issue; had pneumonia twice the Winter of 2016-2017, is disappointed that her dyspnea is not improving faster , but is improving.  Had corticosteroid injections in her T-spine, Dr. Maryjean Ka, for pain in the left shoulder, tingling and numbness in left arm/hand which helped.  Pt Diabetic: No  Pt smoker: non-smoker   Pt meds include:  Statin : No: all statins tried caused very elevated liver and muscle enzymes and muscle weakness/severe cramping  Betablocker: Yes  ASA: Yes  Other anticoagulants/antiplatelets: no    Past Medical History  Diagnosis Date  . Diverticulosis of colon   . Hypothyroidism     Dr Wilson Singer  . Hyperlipidemia     NMR 07/2009: LDL 200 (2260/1233)TG 99, HDL 65. LDL goal =<120, ideally <100. father MI @ 66  . Hypertension   . Microscopic hematuria   . Degenerative joint disease   . Peripheral vascular disease (HCC)     ICA bilat, Dr.Charles fields, [facility]  . Carotid artery occlusion   . Heart murmur   . Anxiety   . Peripheral neuropathy (HCC)     compressive in UE bilaterally; Dr Daylene Katayama  . Aortic insufficiency     mild due to degenerative changes  . Basal cell carcinoma 2007    [facility] Left leg    Social History Social History  Substance Use Topics  . Smoking  status: Never Smoker   . Smokeless tobacco: Never Used  . Alcohol Use: No    Family History Family History  Problem Relation Age of Onset  . Heart attack Father 85  . Heart disease Father     Before age 58  . Colon cancer Mother 31  . Stroke Mother 11  . Kidney disease Mother     ? renal calculi; S/P resecton of kidney  . Cancer Mother 49    Colon  . Ovarian cancer Sister   . Other Sister     Valve replacement (aortic & mitral ) in 2 sisters  . Heart disease Sister     Before age 56  . Heart disease Brother     aortic & mitral valve replacement in 2 bro; both had CBAG  . Diabetes Neg Hx     Surgical History Past Surgical History  Procedure Laterality Date  . Abdominal hysterectomy  1973    age 43 due to dysfuctional menses; HRT x 25-30 years  . Bilateral oophorectomy  1990    prophylactically (sister had ovarian ca)  . Appendectomy  1952  . Cholecystectomy  2006  . Tonsillectomy  1957  . Orthopedic surgery  1989/200/2002/2012    elbows,shoulder surgery x 3, right hand  . Carpal tunnel release  1989    right  . Basal cell cancer  03/2006    leg  . Cataract extraction, bilateral  2010    Dr Kathrin Penner  .  US echocardiography  05/01/2010    trace MR,AI,TR;EF =>55%  . Nm myoview ltd  06/13/2008    low risk scan  . Eye surgery      Bilateral eye  . Colonoscopy      Dr Ardis Hughs  . Rectocele repair  2016    Allergies  Allergen Reactions  . Aspirin Nausea Only    REACTION: GI upset  ( pt can take 81 mg but NOT   325 mg ASA )  . Lovastatin Nausea Only    REACTION: nausea  . Benazepril Hcl   . Bupropion Hcl     REACTION: tinnitis  . Ezetimibe     REACTION: GI symptoms  . Fenofibrate     Myalgia   . Paroxetine   . Pravastatin Sodium Other (See Comments)    REACTION: elevated CPK - Muscle's in bilateral Leg    Current Outpatient Prescriptions  Medication Sig Dispense Refill  . ALPRAZolam (XANAX) 0.5 MG tablet     . amLODipine (NORVASC) 5 MG tablet Take 1  tablet by mouth  daily 90 tablet 3  . aspirin 81 MG tablet Take 81 mg by mouth daily.      Marland Kitchen b complex vitamins tablet Take 1 tablet by mouth daily.      . Calcium Carbonate-Vitamin D (CALCIUM-VITAMIN D) 600-200 MG-UNIT CAPS Take by mouth.      . Cholecalciferol (VITAMIN D) 1000 UNITS capsule Take 1,000 Units by mouth daily.      . cycloSPORINE (RESTASIS) 0.05 % ophthalmic emulsion Place 1 drop into both eyes 2 (two) times daily.     [name] Sodium (COLACE PO) Take by mouth as needed.    Marland Kitchen levothyroxine (SYNTHROID) 88 MCG tablet Take 88 mcg by mouth daily. Taking the brand "synthroid"    . LORazepam (ATIVAN) 0.5 MG tablet Take 1 tablet (0.5 mg total) by mouth 3 (three) times daily as needed. 90 tablet 1  . losartan (COZAAR) 100 MG tablet Take 0.5 tablets (50 mg total) by mouth daily. 45 tablet 3  . meclizine (ANTIVERT) 25 MG tablet Take 1 tablet (25 mg total) by mouth daily. 1/2 every 8 hrs prn for imbalance (Patient taking differently: Take 12.5 mg by mouth 3 (three) times daily as needed. 1/2 every 8 hrs prn for imbalance) 30 tablet 2  . metoprolol (LOPRESSOR) 50 MG tablet Take one-half tablet by  mouth twice a day 90 tablet 3  . Multiple Vitamins-Minerals (CENTRUM SILVER PO) Take by mouth.      . traMADol (ULTRAM) 50 MG tablet 1/2-1 by mouth once daily as needed. 90 tablet 1  . vitamin E 100 UNIT capsule Take 100 Units by mouth daily.      No current facility-administered medications for this visit.    Review of Systems : See HPI for pertinent positives and negatives.  Physical Examination  Filed Vitals:   10/26/15 1341 10/26/15 1343  BP: 94/66 89/65  Pulse: 73 56  Temp: 97.9 F (36.6 C)   Resp: 14   Height: 5' 6.5" (1.689 m)   Weight: 120 lb (54.432 kg)   SpO2: 100%    Body mass index is 19.08 kg/(m^2).  General: WDWN female in NAD  GAIT: normal  Eyes: PERRLA  Pulmonary: CTAB, non labored respirations.  Cardiac: regular rhythm, no murmur detected.   VASCULAR EXAM   Carotid Bruits  Left  Right    Negative  Negative    Aorta is not palpable.  Radial pulses are 2+ palpable  and equal.   LE Pulses  LEFT  RIGHT   POPLITEAL  not palpable  not palpable   POSTERIOR TIBIAL  not palpable  Not palpable   DORSALIS PEDIS  ANTERIOR TIBIAL  Faintly palpable  Faintly palpable   Gastrointestinal: soft, nontender, BS WNL, no r/g, no palpable masses.  Musculoskeletal: No muscle atrophy/wasting. M/S 5/5 throughout, Extremities without ischemic changes.  Neurologic: A&O X 3; Appropriate Affect, Speech is normal  CN 2-12 intact, Pain and light touch intact in extremities, Motor exam as listed above.                Non-Invasive Vascular Imaging CAROTID DUPLEX 10/26/2015   CEREBROVASCULAR DUPLEX EVALUATION    INDICATION: Carotid artery disease     PREVIOUS INTERVENTION(S):     DUPLEX EXAM:     RIGHT  LEFT  Peak Systolic Velocities (cm/s) End Diastolic Velocities (cm/s) Plaque LOCATION Peak Systolic Velocities (cm/s) End Diastolic Velocities (cm/s) Plaque  80 20  CCA PROXIMAL 60 14   67 21  CCA MID 68 17   61 21  CCA DISTAL 58 18   43 6  ECA 43 4   45 17 HT ICA PROXIMAL 37 14 HT  46 20  ICA MID 48 19   117 39  ICA DISTAL 107 19      ICA / CCA Ratio (PSV)   Antegrade  Vertebral Flow Antegrade    Brachial Systolic Pressure (mmHg)   Multiphasic (Subclavian artery) Brachial Artery Waveforms Multiphasic (Subclavian artery)    Plaque Morphology:  HM = Homogeneous, HT = Heterogeneous, CP = Calcific Plaque, SP = Smooth Plaque, IP = Irregular Plaque     ADDITIONAL FINDINGS:     IMPRESSION: Right and left proximal internal carotid artery velocities suggest a 1-39% stenosis.  Elevated velocity noted in the distal left internal carotid artery most likely due to vessel tortuosity.     Compared to the previous exam:  No significant change in comparison to the last exam on 10/20/2014.       Assessment: AJAYA EVERAGE is a 75 y.o. female  who presents with asymptomatic minimal bilateral ICA stenosis. Elevated velocity noted in the distal left internal carotid artery most likely due to vessel tortuosity. No significant change in comparison to the exams on 10/14/2013 and 10/20/14.  Review of records: in April 2014 carotid Duplex suggested 60-79% bilateral ICA stenoses.  128/60's blood pressure at her PCP's office in February 2017; is hypotensive today, but pt denies feeling light headed or dizzy. Pt is taking 3 antihypertensive medications. I advised pt to check and record her daily blood pressure, and discuss with her PCP or cardiologist.  Plan: Follow-up in 1 year with Carotid Duplex scan.   I discussed in depth with the patient the nature of atherosclerosis, and emphasized the importance of maximal medical management including strict control of blood pressure, blood glucose, and lipid levels, obtaining regular exercise, and continued cessation of smoking.  The patient is aware that without maximal medical management the underlying atherosclerotic disease process will progress, limiting the benefit of any interventions. The patient was given information about stroke prevention and what symptoms should prompt the patient to seek immediate medical care. Thank you for allowing [place] to participate in this patient's care.  Clemon Chambers, RN, MSN, FNP-C [facility] Office: [facility] Physician: [name]  10/26/2015 1:53 PM

## 2015-11-01 ENCOUNTER — Telehealth: Payer: Self-pay

## 2015-11-01 NOTE — Telephone Encounter (Signed)
Please reduce losartan to 25 mg daily.

## 2015-11-01 NOTE — Telephone Encounter (Signed)
Patient was seen by NP ([facility]) and blood pressure was low.   Called patient to follow-up on blood pressure. Patient stated that "I felt fine then and I feel fine now." She has been checking her blood pressure daily at home, which she states are back to normal readings for her. Reports she occasionally gets these "drop out spells." She is not usually symptomatic with these events but when she is she "just gives out" she rests for ~30 minutes with relief. She also reports that for the last couple of weeks she wakes up in the middle of the night with cold sweats and wonders if this is related/contributes to her blood pressures.  Routed to Dr C to review and advise.

## 2015-11-02 DIAGNOSIS — H16102 Unspecified superficial keratitis, left eye: Secondary | ICD-10-CM | POA: Diagnosis not present

## 2015-11-02 DIAGNOSIS — H52203 Unspecified astigmatism, bilateral: Secondary | ICD-10-CM | POA: Diagnosis not present

## 2015-11-02 DIAGNOSIS — H04122 Dry eye syndrome of left lacrimal gland: Secondary | ICD-10-CM | POA: Diagnosis not present

## 2015-11-02 DIAGNOSIS — H43813 Vitreous degeneration, bilateral: Secondary | ICD-10-CM | POA: Diagnosis not present

## 2015-11-02 NOTE — Telephone Encounter (Signed)
[email or web address]

## 2015-11-03 NOTE — Telephone Encounter (Signed)
Advice communicated to patient. Recommended dose reduction on losartan and asked her if she would like for me to order 25mg  tablets. She states she thinks she can quarter her current pills but will call if new dose needed.

## 2015-11-17 ENCOUNTER — Telehealth: Payer: Self-pay | Admitting: Cardiovascular Disease

## 2015-11-17 ENCOUNTER — Telehealth: Payer: Self-pay | Admitting: Internal Medicine

## 2015-11-17 NOTE — Telephone Encounter (Signed)
Patient called in stating she really was not feeling good at all.  She states she was weak and mentioned issues with heart and blood pressure fluctuation.  She also mentioned getting bit by a deer tick several weeks prior.  She was wanting to get seen by Dr. Quay Burow today or evaluated on [facility].  Dr. Quay Burow was out of the office and our office did not have any openings left with other providers today.  I suggested we get her scheduled for a follow up with a different provider or Dr. Quay Burow next week and that she should head to the ER.  I wanted to send patient to [facility] for further evaluation but patient refused.   Patient refused scheduling follow up for next week as well.  Patient stated her daughter was a nurse that she was going to contact her daughter to see what she suggested.

## 2015-11-17 NOTE — Telephone Encounter (Signed)
Returned call to pt. She says she's been feeling weak and like she's going to pass out. She feels like her head is spinning. She says the feeling is very frequent.  Recent BP have been as follows: 115/91, Pulse 73; BP 100/66, HR 77; BP 112/66, HR 70. Offered her two appts today, however, she is not able to make it to either one. Will consult with Dr. Sallyanne Kuster and call pt back.

## 2015-11-17 NOTE — Telephone Encounter (Signed)
New message    Patient calling wants to be seen today . C/O weak  - like she going to pass out . Unsure if it's her medication or blood pressure.    No chest pain .    Pt C/O medication issue:  1. Name of Medication: amlodipine   2. How are you currently taking this medication (dosage and times per day)? 5 mg twice a day - was told to cut the pill in half   3. Are you having a reaction (difficulty breathing--STAT)? Blood pressure issues, rapid pulse in  89 / has not take b/p this am unstable   4. What is your medication issue? Wants to be seen today

## 2015-11-17 NOTE — Telephone Encounter (Signed)
Consulted with Dr Sallyanne Kuster. He said pt can stop her losartan if she likes. He does not feel its a matter that she needs to be seen in this office at this time. He advised pt for pt to go to her PCP or urgent care to follow up.  Returned call to pt and gave her his recommendations. She verbalized understanding and said she had already called her PCP to get an appt. She will also go to the urgent care today if needed.

## 2015-11-21 ENCOUNTER — Encounter: Payer: Self-pay | Admitting: Internal Medicine

## 2015-11-21 ENCOUNTER — Ambulatory Visit (INDEPENDENT_AMBULATORY_CARE_PROVIDER_SITE_OTHER)
Admission: RE | Admit: 2015-11-21 | Discharge: 2015-11-21 | Disposition: A | Discharge status: Home / Self Care | Payer: Medicare Other | Source: Ambulatory Visit | Attending: Internal Medicine | Admitting: Internal Medicine

## 2015-11-21 ENCOUNTER — Ambulatory Visit (INDEPENDENT_AMBULATORY_CARE_PROVIDER_SITE_OTHER): Payer: Medicare Other | Admitting: Internal Medicine

## 2015-11-21 ENCOUNTER — Other Ambulatory Visit (INDEPENDENT_AMBULATORY_CARE_PROVIDER_SITE_OTHER): Payer: Medicare Other

## 2015-11-21 VITALS — BP 154/94 | HR 64 | Temp 98.4°F | Resp 16 | Wt 119.0 lb

## 2015-11-21 DIAGNOSIS — R8290 Unspecified abnormal findings in urine: Secondary | ICD-10-CM | POA: Diagnosis not present

## 2015-11-21 DIAGNOSIS — R350 Frequency of micturition: Secondary | ICD-10-CM | POA: Diagnosis not present

## 2015-11-21 DIAGNOSIS — R0602 Shortness of breath: Secondary | ICD-10-CM

## 2015-11-21 DIAGNOSIS — R5383 Other fatigue: Secondary | ICD-10-CM

## 2015-11-21 DIAGNOSIS — R509 Fever, unspecified: Secondary | ICD-10-CM

## 2015-11-21 DIAGNOSIS — R8299 Other abnormal findings in urine: Secondary | ICD-10-CM | POA: Diagnosis not present

## 2015-11-21 DIAGNOSIS — I6523 Occlusion and stenosis of bilateral carotid arteries: Secondary | ICD-10-CM | POA: Diagnosis not present

## 2015-11-21 DIAGNOSIS — W57XXXA Bitten or stung by nonvenomous insect and other nonvenomous arthropods, initial encounter: Secondary | ICD-10-CM

## 2015-11-21 DIAGNOSIS — I10 Essential (primary) hypertension: Secondary | ICD-10-CM

## 2015-11-21 DIAGNOSIS — J449 Chronic obstructive pulmonary disease, unspecified: Secondary | ICD-10-CM | POA: Diagnosis not present

## 2015-11-21 DIAGNOSIS — T148 Other injury of unspecified body region: Secondary | ICD-10-CM

## 2015-11-21 LAB — CBC WITH DIFFERENTIAL/PLATELET
Basophils Absolute: 0 10*3/uL (ref 0.0–0.1)
Basophils Relative: 0.8 % (ref 0.0–3.0)
Eosinophils Absolute: 0.1 10*3/uL (ref 0.0–0.7)
Eosinophils Relative: 1 % (ref 0.0–5.0)
HCT: 39.3 % (ref 36.0–46.0)
Hemoglobin: 13 g/dL (ref 12.0–15.0)
Lymphocytes Relative: 40.7 % (ref 12.0–46.0)
Lymphs Abs: 2.1 10*3/uL (ref 0.7–4.0)
MCHC: 33 g/dL (ref 30.0–36.0)
MCV: 88.8 fl (ref 78.0–100.0)
Monocytes Absolute: 0.7 10*3/uL (ref 0.1–1.0)
Monocytes Relative: 14.6 % — ABNORMAL HIGH (ref 3.0–12.0)
Neutro Abs: 2.2 10*3/uL (ref 1.4–7.7)
Neutrophils Relative %: 42.9 % — ABNORMAL LOW (ref 43.0–77.0)
Platelets: 251 10*3/uL (ref 150.0–400.0)
RBC: 4.43 Mil/uL (ref 3.87–5.11)
RDW: 13.9 % (ref 11.5–15.5)
WBC: 5.1 10*3/uL (ref 4.0–10.5)

## 2015-11-21 LAB — COMPREHENSIVE METABOLIC PANEL
ALT: 11 U/L (ref 0–35)
AST: 25 U/L (ref 0–37)
Albumin: 3.9 g/dL (ref 3.5–5.2)
Alkaline Phosphatase: 83 U/L (ref 39–117)
BUN: 22 mg/dL (ref 6–23)
CO2: 29 mEq/L (ref 19–32)
Calcium: 11.1 mg/dL — ABNORMAL HIGH (ref 8.4–10.5)
Chloride: 105 mEq/L (ref 96–112)
Creatinine, Ser: 1.32 mg/dL — ABNORMAL HIGH (ref 0.40–1.20)
GFR: 41.46 mL/min — ABNORMAL LOW (ref >60.00)
Glucose, Bld: 94 mg/dL (ref 70–99)
Potassium: 4.8 mEq/L (ref 3.5–5.1)
Sodium: 139 mEq/L (ref 135–145)
Total Bilirubin: 0.7 mg/dL (ref 0.2–1.2)
Total Protein: 7.2 g/dL (ref 6.0–8.3)

## 2015-11-21 LAB — URINALYSIS, ROUTINE W REFLEX MICROSCOPIC
Bilirubin Urine: NEGATIVE
Hgb urine dipstick: NEGATIVE
Ketones, ur: NEGATIVE
Leukocytes, UA: NEGATIVE
Nitrite: NEGATIVE
RBC / HPF: NONE SEEN (ref 0–?)
Specific Gravity, Urine: <=1.005 — AB (ref 1.000–1.030)
Total Protein, Urine: NEGATIVE
Urine Glucose: NEGATIVE
Urobilinogen, UA: 0.2 (ref 0.0–1.0)
pH: 6.5 (ref 5.0–8.0)

## 2015-11-21 NOTE — Progress Notes (Signed)
Subjective:    Patient ID: Deborah Shields, female    DOB: [birth year], 75 y.o.   MRN: [id number]  HPI She is here for an acute visit for fluctuations in her BP and feeling weak.    She got bite by a tick 3-4 weeks ago.  It was a deer tick. She is unsure how long the tick was on her.  She felt fine for a week or two, but then started to have symptoms, including, leg weakness/pain from her knees down, fever up to 101, dizzy/lightheaded, fatigue.  She feels generalized weakness.  She needs to rest after showering.  She has had decreased appetite.  Her eyes feel swollen.  She has also had some increased shortness of breath. She didn't note for a while that her urine looked different and had no particular. She denies any painful urination or blood in urine. She knows she doesn't feel ready and is wanting to have blood work done.  Hypertension: She is taking her medication daily. She was recently taken off the losartan.  She is compliant with a low sodium diet.  She does monitor her blood pressure at home.  BP at home 115/77  60, 127/75  68, 138/85  62.      Medications and allergies reviewed with patient and updated if appropriate.  Patient Active Problem List   Diagnosis Date Noted  . Anxiety 08/15/2015  . Encounter for pre-operative cardiovascular clearance 07/10/2015  . Pre-operative clearance 07/07/2015  . Rotator cuff tear 07/07/2015  . Rectocele, female 08/04/2014  . Dyspnea on exertion 02/14/2014  . DOE (dyspnea on exertion) 12/31/2013  . Other malaise and fatigue 12/31/2013  . PAF (paroxysmal atrial fibrillation) ([place]) 01/10/2013  . Thrombosed external hemorrhoid 11/26/2012  . Renal insufficiency 09/03/2011  . Atherosclerosis of native arteries of the extremities with intermittent claudication 04/04/2011  . Occlusion and stenosis of carotid artery without mention of cerebral infarction 04/04/2011  . PERIPHERAL NEUROPATHY 08/15/2008  . Hypothyroidism 08/12/2007  . HYPERLIPIDEMIA  08/12/2007  . Essential hypertension 08/12/2007  . Osteopenia 08/12/2007  . Carotid artery stenosis, asymptomatic 08/11/2007  . Osteoarthritis 08/11/2007  . BASAL CELL CARCINOMA, HX OF 08/11/2007  . FIBROCYSTIC BREAST DISEASE 11/18/2006  . DIVERTICULOSIS, COLON 08/10/2006    Current Outpatient Prescriptions on File Prior to Visit  Medication Sig Dispense Refill  . ALPRAZolam (XANAX) 0.5 MG tablet     . amLODipine (NORVASC) 5 MG tablet Take 1 tablet by mouth  daily 90 tablet 3  . aspirin 81 MG tablet Take 81 mg by mouth daily.      Marland Kitchen b complex vitamins tablet Take 1 tablet by mouth daily.      . Calcium Carbonate-Vitamin D (CALCIUM-VITAMIN D) 600-200 MG-UNIT CAPS Take by mouth.      . Cholecalciferol (VITAMIN D) 1000 UNITS capsule Take 1,000 Units by mouth daily.      . cycloSPORINE (RESTASIS) 0.05 % ophthalmic emulsion Place 1 drop into both eyes 2 (two) times daily.     [name] Sodium (COLACE PO) Take by mouth as needed.    Marland Kitchen levothyroxine (SYNTHROID) 88 MCG tablet Take 88 mcg by mouth daily. Taking the brand "synthroid"    . LORazepam (ATIVAN) 0.5 MG tablet Take 1 tablet (0.5 mg total) by mouth 3 (three) times daily as needed. 90 tablet 1  . meclizine (ANTIVERT) 25 MG tablet Take 1 tablet (25 mg total) by mouth daily. 1/2 every 8 hrs prn for imbalance (Patient taking differently: Take 12.5  mg by mouth 3 (three) times daily as needed. 1/2 every 8 hrs prn for imbalance) 30 tablet 2  . metoprolol (LOPRESSOR) 50 MG tablet Take one-half tablet by  mouth twice a day 90 tablet 3  . Multiple Vitamins-Minerals (CENTRUM SILVER PO) Take by mouth.      . traMADol (ULTRAM) 50 MG tablet 1/2-1 by mouth once daily as needed. 90 tablet 1  . vitamin E 100 UNIT capsule Take 100 Units by mouth daily.     Marland Kitchen losartan (COZAAR) 100 MG tablet Take 0.5 tablets (50 mg total) by mouth daily. (Patient not taking: Reported on 11/21/2015) 45 tablet 3   No current facility-administered medications on file prior to  visit.    Past Medical History  Diagnosis Date  . Diverticulosis of colon   . Hypothyroidism     Dr Wilson Singer  . Hyperlipidemia     NMR 07/2009: LDL 200 (2260/1233)TG 99, HDL 65. LDL goal =<120, ideally <100. father MI @ 27  . Hypertension   . Microscopic hematuria   . Degenerative joint disease   . Peripheral vascular disease (HCC)     ICA bilat, Dr.Charles fields, [facility]  . Carotid artery occlusion   . Heart murmur   . Anxiety   . Peripheral neuropathy (HCC)     compressive in UE bilaterally; Dr Daylene Katayama  . Aortic insufficiency     mild due to degenerative changes  . Basal cell carcinoma 2007    [place] [facility] Left leg    Past Surgical History  Procedure Laterality Date  . Abdominal hysterectomy  1973    age 83 due to dysfuctional menses; HRT x 25-30 years  . Bilateral oophorectomy  1990    prophylactically (sister had ovarian ca)  . Appendectomy  1952  . Cholecystectomy  2006  . Tonsillectomy  1957  . Orthopedic surgery  1989/200/2002/2012    elbows,shoulder surgery x 3, right hand  . Carpal tunnel release  1989    right  . Basal cell cancer  03/2006    leg  . Cataract extraction, bilateral  2010    Dr Kathrin Penner  . US echocardiography  05/01/2010    trace MR,AI,TR;EF =>55%  . Nm myoview ltd  06/13/2008    low risk scan  . Eye surgery      Bilateral eye  . Colonoscopy      Dr Ardis Hughs  . Rectocele repair  2016    Social History   Social History  . Marital Status: Widowed    Spouse Name: N/A  . Number of Children: N/A  . Years of Education: N/A   Social History Main Topics  . Smoking status: Never Smoker   . Smokeless tobacco: Never Used  . Alcohol Use: No  . Drug Use: No  . Sexual Activity: Not Asked   Other Topics Concern  . None   Social History Narrative   Low cholesterol diet   Regular exercise- yes     Family History  Problem Relation Age of Onset  . Heart attack Father 85  . Heart disease Father     Before age 76  . Colon cancer Mother 65    . Stroke Mother 23  . Kidney disease Mother     ? renal calculi; S/P resecton of kidney  . Cancer Mother 73    Colon  . Ovarian cancer Sister   . Other Sister     Valve replacement (aortic & mitral ) in 2 sisters  . Heart  disease Sister     Before age 22  . Heart disease Brother     aortic & mitral valve replacement in 2 bro; both had CBAG  . Diabetes Neg Hx     Review of Systems  Constitutional: Positive for fever (101 x 1, no fever since), diaphoresis (at night - cold sweats in beginning - none since then), appetite change and fatigue.  HENT: Negative for congestion, ear pain and sore throat.   Respiratory: Positive for shortness of breath. Negative for cough and wheezing.   Cardiovascular: Positive for palpitations (occasionall with low BP - couple of times). Negative for chest pain.  Genitourinary: Positive for frequency. Negative for dysuria and hematuria.       Different odor and color in urine  Musculoskeletal: Positive for back pain (improved). Negative for myalgias, joint swelling and arthralgias.  Skin: Negative for rash.  Neurological: Positive for dizziness, light-headedness and headaches. Negative for numbness.       Objective:   Filed Vitals:   11/21/15 1058  BP: 154/94  Pulse: 64  Temp: 98.4 F (36.9 C)  Resp: 16   Filed Weights   11/21/15 1058  Weight: 119 lb (53.978 kg)   Body mass index is 18.92 kg/(m^2).   Physical Exam  Constitutional: She is oriented to person, place, and time. She appears well-developed and well-nourished. No distress.  HENT:  Head: Normocephalic and atraumatic.  Eyes: Conjunctivae are normal.  Neck: Neck supple. No tracheal deviation present. No thyromegaly present.  Cardiovascular: Normal rate, regular rhythm and normal heart sounds.   No murmur heard. Pulmonary/Chest: Effort normal and breath sounds normal. No respiratory distress. She has no wheezes. She has no rales.  Abdominal: Soft. She exhibits no distension. There  is no tenderness.  Musculoskeletal: She exhibits no edema.  Lymphadenopathy:    She has no cervical adenopathy.  Neurological: She is alert and oriented to person, place, and time.  Skin: She is not diaphoretic.          Assessment & Plan:   See Problem List for Assessment and Plan of chronic medical problems.

## 2015-11-21 NOTE — Progress Notes (Signed)
Pre visit review using our clinic review tool, if applicable. No additional management support is needed unless otherwise documented below in the visit note. 

## 2015-11-21 NOTE — Assessment & Plan Note (Signed)
Will check urinalysis and urine culture to rule out infection

## 2015-11-21 NOTE — Assessment & Plan Note (Signed)
Fever in the past couple of weeks of 101-no current fever, but feels warm at times Given her other symptoms will check basic blood work and tickborne illness blood work

## 2015-11-21 NOTE — Assessment & Plan Note (Signed)
Given her current symptoms and recent tick bite we will check tick borne illness blood work

## 2015-11-21 NOTE — Assessment & Plan Note (Signed)
She was recently taken off her blood pressure medications due to hypotension She is monitoring her blood pressure at home regularly and her blood pressure has been well controlled and on the low side Continue to monitor blood pressure regularly

## 2015-11-21 NOTE — Assessment & Plan Note (Signed)
Given her fever will check a chest x-ray to rule out possible pneumonia

## 2015-11-21 NOTE — Patient Instructions (Signed)
  Test(s) ordered today. Your results will be released to MyChart (or called to you) after review, usually within 72hours after test completion. If any changes need to be made, you will be notified at that same time.    Medications reviewed and updated.  No changes recommended at this time.     

## 2015-11-21 NOTE — Assessment & Plan Note (Signed)
Check tick diseases, basic blood work She deferred rechecking TSH-follows with endocrine

## 2015-11-22 ENCOUNTER — Telehealth: Payer: Self-pay | Admitting: Emergency Medicine

## 2015-11-22 DIAGNOSIS — W57XXXA Bitten or stung by nonvenomous insect and other nonvenomous arthropods, initial encounter: Secondary | ICD-10-CM

## 2015-11-22 DIAGNOSIS — R42 Dizziness and giddiness: Secondary | ICD-10-CM

## 2015-11-22 LAB — URINE CULTURE
Colony Count: NO GROWTH
Organism ID, Bacteria: NO GROWTH

## 2015-11-22 NOTE — Telephone Encounter (Signed)
Spoke with pt to give lab results. Pt stated she would come in and have labs redrawn if she was able to. Labs have been entered.

## 2015-11-22 NOTE — Telephone Encounter (Signed)
-----   Message from Binnie Rail, MD sent at 11/21/2015  9:02 PM EDT ----- Her kidney function is stable. Liver tests are normal. Her urine looks normal-no evidence of infection. Her routine blood counts are normal. Her chest x-ray is normal. Tick blood work not done-she needs to come back to the lab unless they can add it on.

## 2015-11-27 NOTE — Telephone Encounter (Signed)
Patient called to check on labs. Advised that orders atre entered. She states that she would like for Deborah Shields to call her back today because she has another problem that sh would like to speak about

## 2015-11-27 NOTE — Telephone Encounter (Signed)
I doubt it is her cortisol but we can check it.  She needs to have the blood work done at 8 am - the cortisol has to be checked around a specific time

## 2015-11-27 NOTE — Addendum Note (Signed)
Addended by: Binnie Rail on: 11/27/2015 01:04 PM   Modules accepted: Orders

## 2015-11-27 NOTE — Telephone Encounter (Signed)
Spoke with pt to inform. Advised to have lab work done for Tick bite as well.. All labs have been ordered.

## 2015-11-27 NOTE — Telephone Encounter (Signed)
Pt would like to have her cortisol levels checked in her adrenal glands. States she gets up and is dizzy and isnt able to do much through out the day. She is taking her BP at home and it is "up and Down" She does not feel that this is related to the tick bite. Please advise.

## 2015-11-28 ENCOUNTER — Other Ambulatory Visit (INDEPENDENT_AMBULATORY_CARE_PROVIDER_SITE_OTHER): Payer: Medicare Other

## 2015-11-28 ENCOUNTER — Other Ambulatory Visit: Payer: Self-pay | Admitting: Internal Medicine

## 2015-11-28 DIAGNOSIS — T148 Other injury of unspecified body region: Secondary | ICD-10-CM | POA: Diagnosis not present

## 2015-11-28 DIAGNOSIS — W57XXXA Bitten or stung by nonvenomous insect and other nonvenomous arthropods, initial encounter: Secondary | ICD-10-CM

## 2015-11-28 DIAGNOSIS — R42 Dizziness and giddiness: Secondary | ICD-10-CM | POA: Diagnosis not present

## 2015-11-28 LAB — CORTISOL: Cortisol, Plasma: 14 ug/dL

## 2015-11-29 LAB — LYME AB/WESTERN BLOT REFLEX: B burgdorferi Ab IgG+IgM: <0.9 Index (ref <0.90)

## 2015-11-30 ENCOUNTER — Encounter: Payer: Self-pay | Admitting: Internal Medicine

## 2015-11-30 LAB — BABESIA MICROTI ANTIBODY PANEL
Babesia microti IgG: <1:10 {titer}
Babesia microti IgM: <1:10 {titer}

## 2015-11-30 LAB — REFLEX RMSF IGG TITER: RMSF IgG Titer: 1:256 {titer} — ABNORMAL HIGH

## 2015-11-30 LAB — ROCKY MTN SPOTTED FVR ABS PNL(IGG+IGM)
RMSF IgG: DETECTED — AB
RMSF IgM: NOT DETECTED

## 2015-12-01 MED ORDER — DOXYCYCLINE HYCLATE 100 MG PO TABS: 100.0000 mg | ORAL_TABLET | Freq: Two times a day (BID) | ORAL | Status: DC

## 2015-12-01 NOTE — Telephone Encounter (Signed)
RX sent to [facility] per MD

## 2015-12-01 NOTE — Telephone Encounter (Signed)
Spoke with pt to give results. Pt states that she has not had any antibiotics. Please send in Doxy to [facility] on file. She is still weak/sluggish and not feeling well.

## 2015-12-03 LAB — EHRLICHIA ANTIBODY PANEL
E chaffeensis (HGE) Ab, IgG: 1:256 {titer} — ABNORMAL HIGH
E chaffeensis (HGE) Ab, IgM: <1:20 {titer}

## 2015-12-08 NOTE — Addendum Note (Signed)
Addended by: Mena Goes on: 12/08/2015 05:04 PM   Modules accepted: Orders

## 2015-12-11 DIAGNOSIS — D1801 Hemangioma of skin and subcutaneous tissue: Secondary | ICD-10-CM | POA: Diagnosis not present

## 2015-12-11 DIAGNOSIS — L821 Other seborrheic keratosis: Secondary | ICD-10-CM | POA: Diagnosis not present

## 2015-12-11 DIAGNOSIS — D692 Other nonthrombocytopenic purpura: Secondary | ICD-10-CM | POA: Diagnosis not present

## 2015-12-11 DIAGNOSIS — D2272 Melanocytic nevi of left lower limb, including hip: Secondary | ICD-10-CM | POA: Diagnosis not present

## 2015-12-11 DIAGNOSIS — Z85828 Personal history of other malignant neoplasm of skin: Secondary | ICD-10-CM | POA: Diagnosis not present

## 2015-12-11 DIAGNOSIS — L570 Actinic keratosis: Secondary | ICD-10-CM | POA: Diagnosis not present

## 2015-12-11 DIAGNOSIS — L237 Allergic contact dermatitis due to plants, except food: Secondary | ICD-10-CM | POA: Diagnosis not present

## 2015-12-11 DIAGNOSIS — L814 Other melanin hyperpigmentation: Secondary | ICD-10-CM | POA: Diagnosis not present

## 2015-12-11 DIAGNOSIS — L820 Inflamed seborrheic keratosis: Secondary | ICD-10-CM | POA: Diagnosis not present

## 2015-12-21 ENCOUNTER — Telehealth: Payer: Self-pay | Admitting: Emergency Medicine

## 2015-12-21 NOTE — Telephone Encounter (Signed)
FAXED OV notes, Labs, and snap shot, and [facility] form to [facility]

## 2015-12-22 ENCOUNTER — Telehealth: Payer: Self-pay | Admitting: Emergency Medicine

## 2015-12-22 ENCOUNTER — Other Ambulatory Visit: Payer: Medicare Other

## 2015-12-22 DIAGNOSIS — A938 Other specified arthropod-borne viral fevers: Secondary | ICD-10-CM | POA: Diagnosis not present

## 2015-12-22 NOTE — Telephone Encounter (Signed)
I would like her to come in so we can revisit her symptoms.  Blood work ordered

## 2015-12-22 NOTE — Telephone Encounter (Signed)
Will it be okay for her to wait until you get back from vacation or should she see someone else?

## 2015-12-22 NOTE — Telephone Encounter (Signed)
If her symptoms are bad she should see one soon

## 2015-12-22 NOTE — Telephone Encounter (Signed)
LVM for pt to come in for blood work and call to make appt.

## 2015-12-22 NOTE — Telephone Encounter (Signed)
Spoke with pt, states she is still not feeling well. Pt states [facility] is requiring her to get repeat blood work for [employer]. Does pt need an office visit? LVM for [facility] to call back to find out exactly what labs are needed.

## 2015-12-25 DIAGNOSIS — E0789 Other specified disorders of thyroid: Secondary | ICD-10-CM | POA: Diagnosis not present

## 2015-12-25 DIAGNOSIS — N183 Chronic kidney disease, stage 3 (moderate): Secondary | ICD-10-CM | POA: Diagnosis not present

## 2015-12-25 NOTE — Telephone Encounter (Signed)
Spoke with pt on Friday, she was able to come in and get blood work.

## 2015-12-30 LAB — EHRLICHIA ANTIBODY PANEL
E chaffeensis (HGE) Ab, IgG: 1:128 {titer} — ABNORMAL HIGH
E chaffeensis (HGE) Ab, IgM: <1:20 {titer}

## 2016-01-02 DIAGNOSIS — R0602 Shortness of breath: Secondary | ICD-10-CM | POA: Diagnosis not present

## 2016-01-02 DIAGNOSIS — A779 Spotted fever, unspecified: Secondary | ICD-10-CM | POA: Diagnosis not present

## 2016-01-09 DIAGNOSIS — J189 Pneumonia, unspecified organism: Secondary | ICD-10-CM | POA: Diagnosis not present

## 2016-01-11 DIAGNOSIS — R0689 Other abnormalities of breathing: Secondary | ICD-10-CM | POA: Diagnosis not present

## 2016-01-11 DIAGNOSIS — R0602 Shortness of breath: Secondary | ICD-10-CM | POA: Diagnosis not present

## 2016-01-15 DIAGNOSIS — E785 Hyperlipidemia, unspecified: Secondary | ICD-10-CM | POA: Diagnosis not present

## 2016-02-06 DIAGNOSIS — E785 Hyperlipidemia, unspecified: Secondary | ICD-10-CM | POA: Diagnosis not present

## 2016-02-14 DIAGNOSIS — I10 Essential (primary) hypertension: Secondary | ICD-10-CM | POA: Diagnosis not present

## 2016-02-14 DIAGNOSIS — E032 Hypothyroidism due to medicaments and other exogenous substances: Secondary | ICD-10-CM | POA: Diagnosis not present

## 2016-02-14 DIAGNOSIS — R609 Edema, unspecified: Secondary | ICD-10-CM | POA: Diagnosis not present

## 2016-03-04 DIAGNOSIS — Z23 Encounter for immunization: Secondary | ICD-10-CM | POA: Diagnosis not present

## 2016-04-01 ENCOUNTER — Encounter: Payer: Self-pay | Admitting: Gastroenterology

## 2016-04-08 ENCOUNTER — Encounter: Payer: Self-pay | Admitting: Gastroenterology

## 2016-04-22 ENCOUNTER — Ambulatory Visit (INDEPENDENT_AMBULATORY_CARE_PROVIDER_SITE_OTHER): Payer: Medicare Other | Admitting: Cardiovascular Disease

## 2016-04-22 ENCOUNTER — Encounter: Payer: Self-pay | Admitting: Cardiovascular Disease

## 2016-04-22 VITALS — BP 100/65 | HR 62 | Ht 66.5 in | Wt 119.0 lb

## 2016-04-22 DIAGNOSIS — E782 Mixed hyperlipidemia: Secondary | ICD-10-CM | POA: Diagnosis not present

## 2016-04-22 DIAGNOSIS — I10 Essential (primary) hypertension: Secondary | ICD-10-CM | POA: Diagnosis not present

## 2016-04-22 DIAGNOSIS — I6523 Occlusion and stenosis of bilateral carotid arteries: Secondary | ICD-10-CM

## 2016-04-22 DIAGNOSIS — I480 Paroxysmal atrial fibrillation: Secondary | ICD-10-CM | POA: Diagnosis not present

## 2016-04-22 MED ORDER — AMLODIPINE BESYLATE 2.5 MG PO TABS: 5.0000 mg | ORAL_TABLET | Freq: Every day | ORAL | 11 refills | Status: DC

## 2016-04-22 NOTE — Patient Instructions (Signed)
Dr Sallyanne Kuster has recommended making the following medication changes: 1. DECREASE Amlodipine to 2.5 mg daily  Your physician recommends that you schedule a follow-up appointment in 1 year. You will receive a reminder letter in the mail two months in advance. If you don't receive a letter, please call our office to schedule the follow-up appointment.  If you need a refill on your cardiac medications before your next appointment, please call your pharmacy.

## 2016-04-22 NOTE — Progress Notes (Signed)
Cardiology Office Note    Date:  04/22/2016   ID:  Deborah, Shields 05-15-1941, MRN [id number]  PCP:  Deborah Bolt, MD  Cardiologist:   Deborah Klein, MD   Chief Complaint  Patient presents with  . Follow-up    History of Present Illness:  Deborah Shields is a 75 y.o. female with a history of remote paroxysmal atrial fibrillation, PAD (bilateral internal carotid artery stenosis), hyperlipidemia, HTN and mild aortic insufficiency. Returning for follow-up.  She was planning right shoulder replacement surgery earlier this year, but then had a bicipital tendon rupture. She was waiting for this to heal before deciding to go ahead with shoulder surgery. she can still do most of her housework, but feels that her right arm is weaker.   August she had a tick bite and this was complicated by [facility] spotted fever from which she has recovered very very slowly. She has lost a lot of weight and is frankly underweight.  She denies problems with chest pain or shortness of breath, but her stamina is lower than in the past. She has not had arrhythmia. She denies swelling or intermittent claudication and has not had syncope. Her blood pressure has been rather low, possibly related to her weight loss. She was frankly hypotensive during the acute illness.  In past has been intolerant to statins (pravastatin, lovastatin), ezetimibe and WelChol.  Past Medical History:  Diagnosis Date  . Anxiety   . Aortic insufficiency    mild due to degenerative changes  . Basal cell carcinoma 2007   [facility] Left leg  . Carotid artery occlusion   . Degenerative joint disease   . Diverticulosis of colon   . Heart murmur   . Hyperlipidemia    NMR 07/2009: LDL 200 (2260/1233)TG 99, HDL 65. LDL goal =<120, ideally <100. father MI @ 13  . Hypertension   . Hypothyroidism    Dr Wilson Singer  . Microscopic hematuria   . Peripheral neuropathy (HCC)    compressive in UE bilaterally; Dr Daylene Katayama  .  Peripheral vascular disease (HCC)    ICA bilat, Dr.Charles fields, [facility]    Past Surgical History:  Procedure Laterality Date  . ABDOMINAL HYSTERECTOMY  1973   age 26 due to dysfuctional menses; HRT x 25-30 years  . APPENDECTOMY  1952  . basal cell cancer  03/2006   leg  . BILATERAL OOPHORECTOMY  1990   prophylactically (sister had ovarian ca)  . [place]   right  . CATARACT EXTRACTION, BILATERAL  2010   Dr Kathrin Penner  . CHOLECYSTECTOMY  2006  . COLONOSCOPY     Dr Ardis Hughs  . EYE SURGERY     Bilateral eye  . NM MYOVIEW LTD  06/13/2008   low risk scan  . ORTHOPEDIC SURGERY  1989/200/2002/2012   elbows,shoulder surgery x 3, right hand  . RECTOCELE REPAIR  2016  . TONSILLECTOMY  1957  . US ECHOCARDIOGRAPHY  05/01/2010   trace MR,AI,TR;EF =>55%    Current Medications: Outpatient Medications Prior to Visit  Medication Sig Dispense Refill  . ALPRAZolam (XANAX) 0.5 MG tablet Take 0.5 mg by mouth as needed.     Marland Kitchen aspirin 81 MG tablet Take 81 mg by mouth daily.      Marland Kitchen b complex vitamins tablet Take 1 tablet by mouth daily.      . cycloSPORINE (RESTASIS) 0.05 % ophthalmic emulsion Place 1 drop into both eyes 2 (two) times daily.     Marland Kitchen  Docusate Sodium (COLACE PO) Take by mouth as needed.    Marland Kitchen levothyroxine (SYNTHROID) 88 MCG tablet Take 88 mcg by mouth daily. Taking the brand "synthroid"    . LORazepam (ATIVAN) 0.5 MG tablet Take 1 tablet (0.5 mg total) by mouth 3 (three) times daily as needed. 90 tablet 1  . meclizine (ANTIVERT) 25 MG tablet Take 1 tablet (25 mg total) by mouth daily. 1/2 every 8 hrs prn for imbalance (Patient taking differently: Take 12.5 mg by mouth 3 (three) times daily as needed. 1/2 every 8 hrs prn for imbalance) 30 tablet 2  . metoprolol (LOPRESSOR) 50 MG tablet Take one-half tablet by  mouth twice a day 90 tablet 3  . Multiple Vitamins-Minerals (CENTRUM SILVER PO) Take by mouth.      . traMADol (ULTRAM) 50 MG tablet 1/2-1 by mouth once daily  as needed. 90 tablet 1  . vitamin E 100 UNIT capsule Take 100 Units by mouth daily.     Marland Kitchen amLODipine (NORVASC) 5 MG tablet Take 1 tablet by mouth  daily 90 tablet 3  . Calcium Carbonate-Vitamin D (CALCIUM-VITAMIN D) 600-200 MG-UNIT CAPS Take by mouth.      . Cholecalciferol (VITAMIN D) 1000 UNITS capsule Take 1,000 Units by mouth daily.      Marland Kitchen doxycycline (VIBRA-TABS) 100 MG tablet      Losartan 100 mg Take 1 tablet (100 mg total) by mouth 2 (two) times daily. (Patient not taking: Reported on 04/22/2016)   Take 1/2 tablet (50 mg) daily 28 tablet 0   No facility-administered medications prior to visit.      Allergies:   Aspirin; Lovastatin; Benazepril hcl; Paroxetine; Bupropion hcl; Ezetimibe; Fenofibrate; and Pravastatin sodium   Social History   Social History  . Marital status: Widowed    Spouse name: N/A  . Number of children: N/A  . Years of education: N/A   Social History Main Topics  . Smoking status: Never Smoker  . Smokeless tobacco: Never Used  . Alcohol use No  . Drug use: No  . Sexual activity: Not on file   Other Topics Concern  . Not on file   Social History Narrative   Low cholesterol diet   Regular exercise- yes      Family History:  The patient's family history includes Cancer (age of onset: 36) in her mother; Colon cancer (age of onset: 65) in her mother; Heart attack (age of onset: 67) in her father; Heart disease in her brother, father, and sister; Kidney disease in her mother; Other in her sister; Ovarian cancer in her sister; Stroke (age of onset: 37) in her mother.   ROS:   Please see the history of present illness.    ROS All other systems reviewed and are negative.   PHYSICAL EXAM:   VS:  BP 100/65 (BP Location: Right Arm, Patient Position: Sitting, Cuff Size: Normal)   Pulse 62   Ht 5' 6.5" (1.689 m)   Wt 119 lb (54 kg)   SpO2 98%   BMI 18.92 kg/m    GEN: Well nourished, well developed, in no acute distress  HEENT: normal  Neck: no  JVD, carotid bruits, or masses Cardiac: Occasional ectopy and a background of RRR; no murmurs, rubs, or gallops,no edema  Respiratory:  clear to auscultation bilaterally, normal work of breathing GI: soft, nontender, nondistended, + BS MS: no deformity or atrophy  Skin: warm and dry, no rash Neuro:  Alert and Oriented x 3, Strength and sensation are  intact Psych: euthymic mood, full affect  Wt Readings from Last 3 Encounters:  04/22/16 119 lb (54 kg)  11/21/15 119 lb (54 kg)  10/26/15 120 lb (54.4 kg)      Studies/Labs Reviewed:   EKG:  EKG is ordered today.  The ekg ordered today demonstrates an is rhythm with a single PAC, otherwise normal  Recent Labs: 11/21/2015: ALT 11; BUN 22; Creatinine, Ser 1.32; Hemoglobin 13.0; Platelets 251.0; Potassium 4.8; Sodium 139   Lipid Panel    Component Value Date/Time   CHOL 239 (H) 08/04/2014 1109   TRIG 135.0 08/04/2014 1109   TRIG 77 05/21/2006 1321   HDL 57.70 08/04/2014 1109   CHOLHDL 4 08/04/2014 1109   VLDL 27.0 08/04/2014 1109   LDLCALC 154 (H) 08/04/2014 1109   LDLDIRECT 169.9 01/16/2011 0854     ASSESSMENT:    1. Essential hypertension   2. PAF (paroxysmal atrial fibrillation) ([place])   3. HYPERLIPIDEMIA   4. Asymptomatic bilateral carotid artery stenosis      PLAN:  In order of problems listed above:  1. HTN: Her blood pressure is quite low, possibly due to her weight loss. Will decrease the dose of amlodipine. 2. History of periop AFib: She has had a single event that occurred in the perioperative hyperadrenergic state without clinical recurrence. We'll continue with a conservative approach with aspirin for prophylaxis. If she does clearly have episodes of atrial fibrillation the future should switch to full anticoagulation again. 3. HLP: intolerant to statin, zetia, Welchol. Consider PCSK9 inhibitor, but no far has not had a true vascular event. 4. PAD: Bilateral internal carotid artery stenosis followed by Dr.  [name]    Medication Adjustments/Labs and Tests Ordered: Current medicines are reviewed at length with the patient today.  Concerns regarding medicines are outlined above.  Medication changes, Labs and Tests ordered today are listed in the Patient Instructions below. Patient Instructions  Dr Sallyanne Kuster has recommended making the following medication changes: 1. DECREASE Amlodipine to 2.5 mg daily  Your physician recommends that you schedule a follow-up appointment in 1 year. You will receive a reminder letter in the mail two months in advance. If you don't receive a letter, please call our office to schedule the follow-up appointment.  If you need a refill on your cardiac medications before your next appointment, please call your pharmacy.    Signed, Deborah Klein, MD  04/22/2016 1:41 PM    [facility] [address], [place], [place]  [address] Phone: [phone]; Fax: [phone]

## 2016-04-24 ENCOUNTER — Other Ambulatory Visit: Payer: Self-pay | Admitting: Cardiovascular Disease

## 2016-04-24 DIAGNOSIS — I10 Essential (primary) hypertension: Secondary | ICD-10-CM

## 2016-04-24 MED ORDER — AMLODIPINE BESYLATE 2.5 MG PO TABS: 5.0000 mg | ORAL_TABLET | Freq: Every day | ORAL | 3 refills | Status: DC

## 2016-04-24 NOTE — Telephone Encounter (Signed)
Pt says her Amlodipine was called in to the wrong Pharmacy. Please call or fax it to [facility] and refills for one year.The fax number is [phone].

## 2016-04-30 ENCOUNTER — Telehealth: Payer: Self-pay | Admitting: Cardiovascular Disease

## 2016-04-30 NOTE — Telephone Encounter (Signed)
Returned call to patient.She stated at her last visit with Dr.Croitoru 04/22/16 he decreased Amlodipine to 2.5 mg daily.Stated on her print out of medications it said take Amlodipine 2.5 mg 2 tablets daily.After reviewing 04/22/16 office note Dr.Croitoru advised to decrease Amlodipine to 2.5 mg daily.

## 2016-04-30 NOTE — Telephone Encounter (Signed)
New message      Talk to a nurse about her amlodipine dosage.  Please call before 10:30 or after 1:30

## 2016-05-13 DIAGNOSIS — E785 Hyperlipidemia, unspecified: Secondary | ICD-10-CM | POA: Diagnosis not present

## 2016-05-13 DIAGNOSIS — N183 Chronic kidney disease, stage 3 (moderate): Secondary | ICD-10-CM | POA: Diagnosis not present

## 2016-05-13 DIAGNOSIS — J189 Pneumonia, unspecified organism: Secondary | ICD-10-CM | POA: Diagnosis not present

## 2016-05-13 DIAGNOSIS — Z8701 Personal history of pneumonia (recurrent): Secondary | ICD-10-CM | POA: Diagnosis not present

## 2016-05-15 DIAGNOSIS — H11131 Conjunctival pigmentations, right eye: Secondary | ICD-10-CM | POA: Diagnosis not present

## 2016-05-20 DIAGNOSIS — H1131 Conjunctival hemorrhage, right eye: Secondary | ICD-10-CM | POA: Diagnosis not present

## 2016-05-20 DIAGNOSIS — H04121 Dry eye syndrome of right lacrimal gland: Secondary | ICD-10-CM | POA: Diagnosis not present

## 2016-05-20 DIAGNOSIS — H5711 Ocular pain, right eye: Secondary | ICD-10-CM | POA: Diagnosis not present

## 2016-05-20 DIAGNOSIS — H16101 Unspecified superficial keratitis, right eye: Secondary | ICD-10-CM | POA: Diagnosis not present

## 2016-05-22 DIAGNOSIS — R899 Unspecified abnormal finding in specimens from other organs, systems and tissues: Secondary | ICD-10-CM | POA: Diagnosis not present

## 2016-05-22 DIAGNOSIS — I10 Essential (primary) hypertension: Secondary | ICD-10-CM | POA: Diagnosis not present

## 2016-05-22 DIAGNOSIS — E032 Hypothyroidism due to medicaments and other exogenous substances: Secondary | ICD-10-CM | POA: Diagnosis not present

## 2016-06-06 ENCOUNTER — Other Ambulatory Visit: Payer: Self-pay | Admitting: Endocrinology

## 2016-06-06 DIAGNOSIS — Z1231 Encounter for screening mammogram for malignant neoplasm of breast: Secondary | ICD-10-CM

## 2016-06-11 ENCOUNTER — Ambulatory Visit: Payer: Medicare Other

## 2016-06-25 ENCOUNTER — Encounter: Payer: Self-pay | Admitting: Gastroenterology

## 2016-06-25 ENCOUNTER — Ambulatory Visit (INDEPENDENT_AMBULATORY_CARE_PROVIDER_SITE_OTHER): Payer: Medicare Other | Admitting: Gastroenterology

## 2016-06-25 VITALS — BP 100/60 | HR 80 | Ht 65.0 in | Wt 116.0 lb

## 2016-06-25 DIAGNOSIS — R1310 Dysphagia, unspecified: Secondary | ICD-10-CM | POA: Diagnosis not present

## 2016-06-25 DIAGNOSIS — Z800 Family history of malignant neoplasm of digestive organs: Secondary | ICD-10-CM

## 2016-06-25 DIAGNOSIS — Z1211 Encounter for screening for malignant neoplasm of colon: Secondary | ICD-10-CM

## 2016-06-25 DIAGNOSIS — I10 Essential (primary) hypertension: Secondary | ICD-10-CM | POA: Diagnosis not present

## 2016-06-25 DIAGNOSIS — N183 Chronic kidney disease, stage 3 (moderate): Secondary | ICD-10-CM | POA: Diagnosis not present

## 2016-06-25 MED ORDER — NA SULFATE-K SULFATE-MG SULF 17.5-3.13-1.6 GM/177ML PO SOLN: ORAL | 0 refills | Status: DC

## 2016-06-25 NOTE — Patient Instructions (Addendum)
You have been scheduled for an endoscopy and colonoscopy. Please follow the written instructions given to you at your visit today. Please pick up your prep supplies at the pharmacy within the next 1-3 days. If you use inhalers (even only as needed), please bring them with you on the day of your procedure. Your physician has requested that you go to www.startemmi.com and enter the access code given to you at your visit today. This web site gives a general overview about your procedure. However, you should still follow specific instructions given to you by our office regarding your preparation for the procedure.  Your body mass index should be between 23-30. Your Body mass index is 19.3 kg/m. If this is out of the aforementioned range listed, please consider follow up with your Primary Care Provider.

## 2016-06-25 NOTE — Progress Notes (Signed)
Review of pertinent gastrointestinal problems: 1. Family history of colon cancer, mother diagnosed in her early 59s. Colonoscopy 2006 Dr. Inocente Salles low Exie Parody was normal. Colonoscopy 12 2012 Dr. Oretha Caprice was also normal. She was recommended to consider repeat colon cancer screening at five-year interval. 2. Thrombosed external hemorrhoid, cared for at [facility] surgery around 2013. 3. "Dyspepsia, GERD, abd pain" indications on EGD 2006 Dr. Velora Heckler: small HH, "granular antrum mucosa" that was not biopsied.  HPI: This is a  very pleasant 76 year old woman who was due for colonoscopy for colon cancer screening 2 or 3 months ago. I sent her a letter after reviewing her chart and realizing that she was 76 years old. I wanted to discuss her general health, need for further colon cancer screening.  Chief complaint is family history colon cancer, also intermittent pill and dry foods associated dysphagia  Rectocele  Repaired 2-3 years ago; can be constipated at times.  Overall pretty regular.  No more hemorrhoid issues.  Mother had colon cancer; diagnosed around age 90   Pill and dry bread associated dysphagia.  No pyrosis.   Lost weight with RMSF over the summer, but has stabilzed.   Review of systems: Pertinent positive and negative review of systems were noted in the above HPI section. Complete review of systems was performed and was otherwise normal.   Past Medical History:  Diagnosis Date  . Anxiety   . Aortic insufficiency    mild due to degenerative changes  . Arthritis   . Basal cell carcinoma 2007   [facility] [facility] Left leg  . Carotid artery occlusion   . Degenerative joint disease   . Diverticulosis of colon   . Heart murmur   . Hyperlipidemia    NMR 07/2009: LDL 200 (2260/1233)TG 99, HDL 65. LDL goal =<120, ideally <100. father MI @ 49  . Hypertension   . Hypothyroidism    Dr Wilson Singer  . Microscopic hematuria   . Peripheral neuropathy (HCC)    compressive in UE  bilaterally; Dr Daylene Katayama  . Peripheral vascular disease (HCC)    ICA bilat, Dr.Charles fields, [facility]  . Pneumonia     Past Surgical History:  Procedure Laterality Date  . ABDOMINAL HYSTERECTOMY  1973   age 32 due to dysfuctional menses; HRT x 25-30 years  . APPENDECTOMY  1952  . basal cell cancer  03/2006   leg  . BILATERAL OOPHORECTOMY  1990   prophylactically (sister had ovarian ca)  . [place]   right  . CATARACT EXTRACTION, BILATERAL  2010   Dr Kathrin Penner  . CHOLECYSTECTOMY  2006  . COLONOSCOPY     Dr Ardis Hughs  . EYE SURGERY     Bilateral eye  . NM MYOVIEW LTD  06/13/2008   low risk scan  . ORTHOPEDIC SURGERY  1989/200/2002/2012   elbows,shoulder surgery x 3, right hand  . RECTOCELE REPAIR  2016  . TONSILLECTOMY  1957  . US ECHOCARDIOGRAPHY  05/01/2010   trace MR,AI,TR;EF =>55%    Current Outpatient Prescriptions  Medication Sig Dispense Refill  . amLODipine (NORVASC) 2.5 MG tablet Take 1 tablet (2.5 mg total) by mouth daily. 90 tablet 3  . aspirin 81 MG tablet Take 81 mg by mouth daily.      Marland Kitchen b complex vitamins tablet Take 1 tablet by mouth daily.      . Calcium Carbonate (CALTRATE 600 PO) Take 1 tablet by mouth 2 (two) times daily.    . cholecalciferol (VITAMIN  D) 1000 units tablet Take 1,000 Units by mouth daily.    . cyclobenzaprine (FLEXERIL) 10 MG tablet Take 10 mg by mouth as needed for muscle spasms.    . cycloSPORINE (RESTASIS) 0.05 % ophthalmic emulsion Place 1 drop into both eyes 2 (two) times daily.     . diclofenac sodium (VOLTAREN) 1 % GEL Apply topically as needed.    Marland Kitchen levothyroxine (SYNTHROID) 88 MCG tablet Take 88 mcg by mouth daily. Taking the brand "synthroid"    . LORazepam (ATIVAN) 0.5 MG tablet Take 1 tablet (0.5 mg total) by mouth 3 (three) times daily as needed. 90 tablet 1  . losartan (COZAAR) 100 MG tablet Take 50 mg by mouth daily.    . meclizine (ANTIVERT) 25 MG tablet Take 1 tablet (25 mg total) by mouth daily. 1/2 every 8  hrs prn for imbalance (Patient taking differently: Take 12.5 mg by mouth 3 (three) times daily as needed. 1/2 every 8 hrs prn for imbalance) 30 tablet 2  . metoprolol (LOPRESSOR) 50 MG tablet Take one-half tablet by  mouth twice a day 90 tablet 3  . Multiple Vitamins-Minerals (CENTRUM SILVER PO) Take by mouth.      . Probiotic Product (PROBIOTIC PO) Take 1 tablet by mouth daily.    . traMADol (ULTRAM) 50 MG tablet 1/2-1 by mouth once daily as needed. 90 tablet 1  . vitamin E 100 UNIT capsule Take 100 Units by mouth daily.      No current facility-administered medications for this visit.     Allergies as of 06/25/2016 - Review Complete 06/25/2016  Allergen Reaction Noted  . Aspirin Nausea Only 11/18/2006  . Lovastatin Nausea Only 01/01/2010  . Benazepril hcl  12/12/2012  . Paroxetine  11/18/2006  . Bupropion hcl Other (See Comments) 11/18/2006  . Ezetimibe Nausea And Vomiting 01/01/2010  . Fenofibrate Other (See Comments) 12/16/2012  . Pravastatin sodium Other (See Comments) 01/01/2010    Family History  Problem Relation Age of Onset  . Heart attack Father 49  . Heart disease Father     Before age 78  . Colon cancer Mother 88  . Stroke Mother 25  . Kidney disease Mother     ? renal calculi; S/P resecton of kidney  . Cancer - Colon Mother 92  . Ovarian cancer Sister   . Other Sister     Valve replacement (aortic & mitral ) in 2 sisters  . Heart disease Sister     Before age 58  . Heart disease Brother     aortic & mitral valve replacement in 2 bro; both had CBAG  . Diabetes Neg Hx     Social History   Social History  . Marital status: Widowed    Spouse name: N/A  . Number of children: 1  . Years of education: N/A   Occupational History  . retired    Social History Main Topics  . Smoking status: Never Smoker  . Smokeless tobacco: Never Used  . Alcohol use No  . Drug use: No  . Sexual activity: Not on file   Other Topics Concern  . Not on file   Social  History Narrative   Low cholesterol diet   Regular exercise- yes      Physical Exam: Ht 5\' 5"  (1.651 m) Comment: height measured without shoes  Wt 116 lb (52.6 kg)   BMI 19.30 kg/m  Constitutional: generally well-appearing Psychiatric: alert and oriented x3 Eyes: extraocular movements intact Mouth: oral pharynx moist,  no lesions Neck: supple no lymphadenopathy Cardiovascular: heart regular rate and rhythm Lungs: clear to auscultation bilaterally Abdomen: soft, nontender, nondistended, no obvious ascites, no peritoneal signs, normal bowel sounds Extremities: no lower extremity edema bilaterally Skin: no lesions on visible extremities   Assessment and plan: 76 y.o. female with  family history colon cancer, also mild intermittent dysphasia  She is in generally very good health and I do think that colon cancer screening is still a relevant issue for her at her current age of 81. I recommended we proceed with colonoscopy at her soonest convenience for colon cancer screening. She has minor pill associated, dry food associated dysphagia. No underlying typical GERD symptoms. I recommended we proceed with upper endoscopy at the same time as colonoscopy.   Owens Loffler, MD [facility] 06/25/2016, 11:08 AM  Cc: Binnie Rail, MD

## 2016-07-01 ENCOUNTER — Ambulatory Visit
Admission: RE | Admit: 2016-07-01 | Discharge: 2016-07-01 | Disposition: A | Discharge status: Home / Self Care | Payer: Medicare Other | Source: Ambulatory Visit | Attending: Endocrinology | Admitting: Endocrinology

## 2016-07-01 DIAGNOSIS — Z1231 Encounter for screening mammogram for malignant neoplasm of breast: Secondary | ICD-10-CM

## 2016-07-03 DIAGNOSIS — I10 Essential (primary) hypertension: Secondary | ICD-10-CM | POA: Diagnosis not present

## 2016-07-03 DIAGNOSIS — E032 Hypothyroidism due to medicaments and other exogenous substances: Secondary | ICD-10-CM | POA: Diagnosis not present

## 2016-07-03 DIAGNOSIS — M0589 Other rheumatoid arthritis with rheumatoid factor of multiple sites: Secondary | ICD-10-CM | POA: Diagnosis not present

## 2016-07-24 DIAGNOSIS — M47816 Spondylosis without myelopathy or radiculopathy, lumbar region: Secondary | ICD-10-CM | POA: Diagnosis not present

## 2016-07-24 DIAGNOSIS — M545 Low back pain: Secondary | ICD-10-CM | POA: Diagnosis not present

## 2016-08-21 DIAGNOSIS — M5136 Other intervertebral disc degeneration, lumbar region: Secondary | ICD-10-CM | POA: Diagnosis not present

## 2016-08-21 DIAGNOSIS — M546 Pain in thoracic spine: Secondary | ICD-10-CM | POA: Diagnosis not present

## 2016-08-21 DIAGNOSIS — M549 Dorsalgia, unspecified: Secondary | ICD-10-CM | POA: Diagnosis not present

## 2016-08-23 ENCOUNTER — Encounter: Payer: Self-pay | Admitting: Gastroenterology

## 2016-08-26 ENCOUNTER — Encounter: Payer: Self-pay | Admitting: Gastroenterology

## 2016-08-26 ENCOUNTER — Ambulatory Visit (AMBULATORY_SURGERY_CENTER): Payer: Medicare Other | Admitting: Gastroenterology

## 2016-08-26 VITALS — BP 134/67 | HR 68 | Temp 96.8°F | Resp 19 | Ht 65.0 in | Wt 116.0 lb

## 2016-08-26 DIAGNOSIS — Z1212 Encounter for screening for malignant neoplasm of rectum: Secondary | ICD-10-CM | POA: Diagnosis not present

## 2016-08-26 DIAGNOSIS — I10 Essential (primary) hypertension: Secondary | ICD-10-CM | POA: Diagnosis not present

## 2016-08-26 DIAGNOSIS — Z800 Family history of malignant neoplasm of digestive organs: Secondary | ICD-10-CM | POA: Diagnosis not present

## 2016-08-26 DIAGNOSIS — R1310 Dysphagia, unspecified: Secondary | ICD-10-CM | POA: Diagnosis not present

## 2016-08-26 DIAGNOSIS — E039 Hypothyroidism, unspecified: Secondary | ICD-10-CM | POA: Diagnosis not present

## 2016-08-26 DIAGNOSIS — Z1211 Encounter for screening for malignant neoplasm of colon: Secondary | ICD-10-CM

## 2016-08-26 DIAGNOSIS — I4891 Unspecified atrial fibrillation: Secondary | ICD-10-CM | POA: Diagnosis not present

## 2016-08-26 MED ORDER — SODIUM CHLORIDE 0.9 % IV SOLN: 500.0000 mL | INTRAVENOUS | Status: DC

## 2016-08-26 NOTE — Progress Notes (Signed)
Report to PACU, RN, vss, BBS= Clear.  

## 2016-08-26 NOTE — Op Note (Signed)
[place] Patient Name: Deborah Shields Procedure Date: 08/26/2016 8:20 AM MRN: [id number] Endoscopist: Milus Banister , MD Age: 76 Referring MD:  Date of Birth: 07/26/1940 Gender: Female Account #: [id number] Procedure:                Upper GI endoscopy Indications:              Dysphagia Medicines:                Monitored Anesthesia Care Procedure:                Pre-Anesthesia Assessment:                           - Prior to the procedure, a History and Physical                            was performed, and patient medications and                            allergies were reviewed. The patient's tolerance of                            previous anesthesia was also reviewed. The risks                            and benefits of the procedure and the sedation                            options and risks were discussed with the patient.                            All questions were answered, and informed consent                            was obtained. Prior Anticoagulants: The patient has                            taken no previous anticoagulant or antiplatelet                            agents. ASA Grade Assessment: II - A patient with                            mild systemic disease. After reviewing the risks                            and benefits, the patient was deemed in                            satisfactory condition to undergo the procedure.                           After obtaining informed consent, the endoscope was  passed under direct vision. Throughout the                            procedure, the patient's blood pressure, pulse, and                            oxygen saturations were monitored continuously. The                            Model GIF-[id number]) scope was introduced                            through the mouth, and advanced to the second part                            of duodenum. The upper GI endoscopy was                          accomplished without difficulty. The patient                            tolerated the procedure well. Scope In: Scope Out: Findings:                 The esophagus was normal.                           The stomach was normal.                           The examined duodenum was normal. Complications:            No immediate complications. Estimated blood loss:                            None. Estimated Blood Loss:     Estimated blood loss: none. Impression:               - Normal esophagus.                           - Normal stomach.                           - Normal examined duodenum. Recommendation:           - Patient has a contact number available for                            emergencies. The signs and symptoms of potential                            delayed complications were discussed with the                            patient. Return to normal activities tomorrow.  Written discharge instructions were provided to the                            patient.                           - Resume previous diet. Chew your food well, eat                            slowly and take small bites. Take pills with extra                            swallow of water.                           - Continue present medications.                           - No repeat upper endoscopy. Milus Banister, MD 08/26/2016 8:44:25 AM This report has been signed electronically.

## 2016-08-26 NOTE — Op Note (Signed)
[place] Patient Name: Deborah Shields Procedure Date: 08/26/2016 8:20 AM MRN: [id number] Endoscopist: Milus Banister , MD Age: 76 Referring MD:  Date of Birth: 1940/12/30 Gender: Female Account #: [id number] Procedure:                Colonoscopy Indications:              Screening in patient at increased risk: Family                            history of 1st-degree relative with colorectal                            cancer: Family history of colon cancer, mother                            diagnosed in her early 80s. Colonoscopy 2006 Dr.                            Lyla Son was normal. Colonoscopy 12 2012 Dr. Oretha Caprice was also normal. Medicines:                Monitored Anesthesia Care Procedure:                Pre-Anesthesia Assessment:                           - Prior to the procedure, a History and Physical                            was performed, and patient medications and                            allergies were reviewed. The patient's tolerance of                            previous anesthesia was also reviewed. The risks                            and benefits of the procedure and the sedation                            options and risks were discussed with the patient.                            All questions were answered, and informed consent                            was obtained. Prior Anticoagulants: The patient has                            taken no previous anticoagulant or antiplatelet  agents. ASA Grade Assessment: II - A patient with                            mild systemic disease. After reviewing the risks                            and benefits, the patient was deemed in                            satisfactory condition to undergo the procedure.                           After obtaining informed consent, the colonoscope                            was passed under direct vision. Throughout  the                            procedure, the patient's blood pressure, pulse, and                            oxygen saturations were monitored continuously. The                            Colonoscope was introduced through the anus and                            advanced to the the cecum, identified by                            appendiceal orifice and ileocecal valve. The                            colonoscopy was performed without difficulty. The                            patient tolerated the procedure well. The quality                            of the bowel preparation was good. The ileocecal                            valve, appendiceal orifice, and rectum were                            photographed. Scope In: 8:28:11 AM Scope Out: 8:36:52 AM Scope Withdrawal Time: 0 hours 6 minutes 39 seconds  Total Procedure Duration: 0 hours 8 minutes 41 seconds  Findings:                 The entire examined colon appeared normal on direct                            and retroflexion views. Complications:  No immediate complications. Estimated blood loss:                            None. Estimated Blood Loss:     Estimated blood loss: none. Impression:               - The entire examined colon is normal on direct and                            retroflexion views.                           - No polyps or cancers Recommendation:           - Patient has a contact number available for                            emergencies. The signs and symptoms of potential                            delayed complications were discussed with the                            patient. Return to normal activities tomorrow.                            Written discharge instructions were provided to the                            patient.                           - Resume previous diet.                           - Continue present medications.                           - You do not need any further colon  cancer                            screening tests (including stool testing). These                            types of tests generally stop around age 40-80. Milus Banister, MD 08/26/2016 8:39:09 AM This report has been signed electronically.

## 2016-08-26 NOTE — Patient Instructions (Signed)
YOU HAD AN ENDOSCOPIC PROCEDURE TODAY AT [facility]:   Refer to the procedure report that was given to you for any specific questions about what was found during the examination.  If the procedure report does not answer your questions, please call your gastroenterologist to clarify.  If you requested that your care partner not be given the details of your procedure findings, then the procedure report has been included in a sealed envelope for you to review at your convenience later.  YOU SHOULD EXPECT: Some feelings of bloating in the abdomen. Passage of more gas than usual.  Walking can help get rid of the air that was put into your GI tract during the procedure and reduce the bloating. If you had a lower endoscopy (such as a colonoscopy or flexible sigmoidoscopy) you may notice spotting of blood in your stool or on the toilet paper. If you underwent a bowel prep for your procedure, you may not have a normal bowel movement for a few days.  Please Note:  You might notice some irritation and congestion in your nose or some drainage.  This is from the oxygen used during your procedure.  There is no need for concern and it should clear up in a day or so.  SYMPTOMS TO REPORT IMMEDIATELY:   Following lower endoscopy (colonoscopy or flexible sigmoidoscopy):  Excessive amounts of blood in the stool  Significant tenderness or worsening of abdominal pains  Swelling of the abdomen that is new, acute  Fever of 100F or higher   Following upper endoscopy (EGD)  Vomiting of blood or coffee ground material  New chest pain or pain under the shoulder blades  Painful or persistently difficult swallowing  New shortness of breath  Fever of 100F or higher  Black, tarry-looking stools  For urgent or emergent issues, a gastroenterologist can be reached at any hour by calling [phone].   DIET:  We do recommend a small meal at first, but then you may proceed to your regular diet.  Drink  plenty of fluids but you should avoid alcoholic beverages for 24 hours.  ACTIVITY:  You should plan to take it easy for the rest of today and you should NOT DRIVE or use heavy machinery until tomorrow (because of the sedation medicines used during the test).    FOLLOW UP: Our staff will call the number listed on your records the next business day following your procedure to check on you and address any questions or concerns that you may have regarding the information given to you following your procedure. If we do not reach you, we will leave a message.  However, if you are feeling well and you are not experiencing any problems, there is no need to return our call.  We will assume that you have returned to your regular daily activities without incident.  If any biopsies were taken you will be contacted by phone or by letter within the next 1-3 weeks.  Please call us at [phone] if you have not heard about the biopsies in 3 weeks.  You do not repeat Colonoscopy anymore Chew your food well,eat slowly and take small bites.Take pills will extra sips of water.    SIGNATURES/CONFIDENTIALITY: You and/or your care partner have signed paperwork which will be entered into your electronic medical record.  These signatures attest to the fact that that the information above on your After Visit Summary has been reviewed and is understood.  Full responsibility of the confidentiality  of this discharge information lies with you and/or your care-partner.

## 2016-08-27 ENCOUNTER — Telehealth: Payer: Self-pay | Admitting: *Deleted

## 2016-08-27 DIAGNOSIS — G8929 Other chronic pain: Secondary | ICD-10-CM | POA: Diagnosis not present

## 2016-08-27 DIAGNOSIS — M5136 Other intervertebral disc degeneration, lumbar region: Secondary | ICD-10-CM | POA: Diagnosis not present

## 2016-08-27 DIAGNOSIS — M47816 Spondylosis without myelopathy or radiculopathy, lumbar region: Secondary | ICD-10-CM | POA: Diagnosis not present

## 2016-08-27 DIAGNOSIS — M545 Low back pain: Secondary | ICD-10-CM | POA: Diagnosis not present

## 2016-08-27 DIAGNOSIS — M546 Pain in thoracic spine: Secondary | ICD-10-CM | POA: Diagnosis not present

## 2016-08-27 NOTE — Telephone Encounter (Signed)
  Follow up Call-  Call back number 08/26/2016  Post procedure Call Back phone  # [phone]  Permission to leave phone message Yes  Some recent data might be hidden     Patient questions:  Do you have a fever, pain , or abdominal swelling? No. Pain Score  0 *  Have you tolerated food without any problems? Yes.    Have you been able to return to your normal activities? Yes.    Do you have any questions about your discharge instructions: Diet   No. Medications  No. Follow up visit  No.  Do you have questions or concerns about your Care? No.  Actions: * If pain score is 4 or above: No action needed, pain <4.

## 2016-09-04 DIAGNOSIS — M4696 Unspecified inflammatory spondylopathy, lumbar region: Secondary | ICD-10-CM | POA: Diagnosis not present

## 2016-09-04 DIAGNOSIS — M47816 Spondylosis without myelopathy or radiculopathy, lumbar region: Secondary | ICD-10-CM | POA: Diagnosis not present

## 2016-09-18 DIAGNOSIS — R768 Other specified abnormal immunological findings in serum: Secondary | ICD-10-CM | POA: Diagnosis not present

## 2016-09-18 DIAGNOSIS — M1990 Unspecified osteoarthritis, unspecified site: Secondary | ICD-10-CM | POA: Diagnosis not present

## 2016-09-18 DIAGNOSIS — M549 Dorsalgia, unspecified: Secondary | ICD-10-CM | POA: Diagnosis not present

## 2016-09-18 DIAGNOSIS — M542 Cervicalgia: Secondary | ICD-10-CM | POA: Diagnosis not present

## 2016-09-23 DIAGNOSIS — M47816 Spondylosis without myelopathy or radiculopathy, lumbar region: Secondary | ICD-10-CM | POA: Diagnosis not present

## 2016-09-25 DIAGNOSIS — E0789 Other specified disorders of thyroid: Secondary | ICD-10-CM | POA: Diagnosis not present

## 2016-09-25 DIAGNOSIS — I10 Essential (primary) hypertension: Secondary | ICD-10-CM | POA: Diagnosis not present

## 2016-09-25 DIAGNOSIS — E034 Atrophy of thyroid (acquired): Secondary | ICD-10-CM | POA: Diagnosis not present

## 2016-09-25 DIAGNOSIS — N183 Chronic kidney disease, stage 3 (moderate): Secondary | ICD-10-CM | POA: Diagnosis not present

## 2016-09-25 DIAGNOSIS — E785 Hyperlipidemia, unspecified: Secondary | ICD-10-CM | POA: Diagnosis not present

## 2016-10-02 DIAGNOSIS — M549 Dorsalgia, unspecified: Secondary | ICD-10-CM | POA: Diagnosis not present

## 2016-10-02 DIAGNOSIS — E032 Hypothyroidism due to medicaments and other exogenous substances: Secondary | ICD-10-CM | POA: Diagnosis not present

## 2016-10-02 DIAGNOSIS — I10 Essential (primary) hypertension: Secondary | ICD-10-CM | POA: Diagnosis not present

## 2016-10-02 DIAGNOSIS — E789 Disorder of lipoprotein metabolism, unspecified: Secondary | ICD-10-CM | POA: Diagnosis not present

## 2016-10-04 DIAGNOSIS — H5713 Ocular pain, bilateral: Secondary | ICD-10-CM | POA: Diagnosis not present

## 2016-10-04 DIAGNOSIS — H16203 Unspecified keratoconjunctivitis, bilateral: Secondary | ICD-10-CM | POA: Diagnosis not present

## 2016-10-06 DIAGNOSIS — H16203 Unspecified keratoconjunctivitis, bilateral: Secondary | ICD-10-CM | POA: Diagnosis not present

## 2016-10-08 DIAGNOSIS — H16203 Unspecified keratoconjunctivitis, bilateral: Secondary | ICD-10-CM | POA: Diagnosis not present

## 2016-10-14 DIAGNOSIS — H16203 Unspecified keratoconjunctivitis, bilateral: Secondary | ICD-10-CM | POA: Diagnosis not present

## 2016-10-18 DIAGNOSIS — H16203 Unspecified keratoconjunctivitis, bilateral: Secondary | ICD-10-CM | POA: Diagnosis not present

## 2016-10-19 DIAGNOSIS — M1712 Unilateral primary osteoarthritis, left knee: Secondary | ICD-10-CM | POA: Diagnosis not present

## 2016-10-25 DIAGNOSIS — M1712 Unilateral primary osteoarthritis, left knee: Secondary | ICD-10-CM | POA: Diagnosis not present

## 2016-10-25 DIAGNOSIS — B0052 Herpesviral keratitis: Secondary | ICD-10-CM | POA: Diagnosis not present

## 2016-10-31 ENCOUNTER — Encounter (HOSPITAL_COMMUNITY): Payer: Medicare Other

## 2016-10-31 ENCOUNTER — Ambulatory Visit: Payer: Medicare Other | Admitting: Family

## 2016-11-06 DIAGNOSIS — M1712 Unilateral primary osteoarthritis, left knee: Secondary | ICD-10-CM | POA: Diagnosis not present

## 2016-11-08 ENCOUNTER — Encounter: Payer: Self-pay | Admitting: Family

## 2016-11-14 ENCOUNTER — Ambulatory Visit (HOSPITAL_COMMUNITY)
Admission: RE | Admit: 2016-11-14 | Discharge: 2016-11-14 | Disposition: A | Discharge status: Home / Self Care | Payer: Medicare Other | Source: Ambulatory Visit | Attending: Family | Admitting: Family

## 2016-11-14 ENCOUNTER — Encounter: Payer: Self-pay | Admitting: Family

## 2016-11-14 ENCOUNTER — Ambulatory Visit (INDEPENDENT_AMBULATORY_CARE_PROVIDER_SITE_OTHER): Payer: Medicare Other | Admitting: Family

## 2016-11-14 VITALS — BP 98/67 | HR 65 | Temp 97.2°F | Resp 20 | Ht 65.0 in | Wt 110.0 lb

## 2016-11-14 DIAGNOSIS — I773 Arterial fibromuscular dysplasia: Secondary | ICD-10-CM

## 2016-11-14 LAB — VAS US CAROTID
LEFT ECA DIAS: -4 cm/s
LEFT VERTEBRAL DIAS: 19 cm/s
Left CCA dist dias: -13 cm/s
Left CCA dist sys: -53 cm/s
Left CCA prox dias: 14 cm/s
Left CCA prox sys: 72 cm/s
Left ICA dist dias: -39 cm/s
Left ICA dist sys: -110 cm/s
Left ICA prox dias: -14 cm/s
Left ICA prox sys: -37 cm/s
RIGHT CCA MID DIAS: 20 cm/s
RIGHT ECA DIAS: -6 cm/s
RIGHT VERTEBRAL DIAS: -14 cm/s
Right CCA prox dias: 15 cm/s
Right CCA prox sys: 63 cm/s
Right cca dist sys: -101 cm/s

## 2016-11-14 NOTE — Progress Notes (Signed)
Chief Complaint: Follow up Extracranial Carotid Artery Stenosis   History of Present Illness  Deborah Shields is a 76 y.o. female patient of Dr. [name] who has known right ICA tortuosity and left ICA which is is c/w fibromuscular dysplasia. She returns today for scheduled surveillance.  Patient denies ever having a stroke or TIA symptoms. Specifically the patient denies a history of: amaurosis fugax or monocular blindness, unilateral facial drooping, hemiplegia, or receptive or expressive aphasia.  She denies claudication, denies non-healing wounds.   Sees a cardiologist, Dr. Orene Desanctis, due to family hx of CAD and personal hx of murmur.    She had corticosteroid injections in her T-spine, Dr. Maryjean Ka, for pain in the left shoulder, tingling and numbness in left arm/hand which helped. She had [facility] Spotted Fever in the Summer 2017.   She cannot recall how she got her left black eye, possibly a door that slammed in her eye at a fast food restuarant.   She has been treated by her PCP for a severe adenovirus, according to daughter. Daughter states pt's gait is unstable, is very weak.  Daughter states pt has also been confused.  Daughter states Dr. Gladstone Lighter requested an MRI of her left leg to evaluate pain and swelling, results not yet known.  Daughter states pt has had weight loss, feeling very weak, denies post prandial abdominal pain.   Pt Diabetic: No  Pt smoker: non-smoker   Pt meds include:  Statin : No: all statins tried caused very elevated liver and muscle enzymes and muscle weakness/severe cramping  Betablocker: Yes  ASA: Yes  Other anticoagulants/antiplatelets: no    Past Medical History:  Diagnosis Date  . Anxiety   . Aortic insufficiency    mild due to degenerative changes  . Arthritis   . Basal cell carcinoma 2007   [facility] Left leg  . Carotid artery occlusion   . Conjunctivitis due to adenovirus, both eyes   . Degenerative joint disease    . Diverticulosis of colon   . Heart murmur   . Hyperlipidemia    NMR 07/2009: LDL 200 (2260/1233)TG 99, HDL 65. LDL goal =<120, ideally <100. father MI @ 14  . Hypertension   . Hypothyroidism    Dr Wilson Singer  . Microscopic hematuria   . Peripheral neuropathy    compressive in UE bilaterally; Dr Daylene Katayama  . Peripheral vascular disease (HCC)    ICA bilat, Dr.Charles fields, [facility]  . Pneumonia   . Rectocele   . [place] spotted fever     Social History Social History  Substance Use Topics  . Smoking status: Never Smoker  . Smokeless tobacco: Never Used  . Alcohol use No    Family History Family History  Problem Relation Age of Onset  . Heart attack Father 60  . Heart disease Father        Before age 58  . Colon cancer Mother 42  . Stroke Mother 68  . Kidney disease Mother        ? renal calculi; S/P resecton of kidney  . Cancer - Colon Mother 50  . Ovarian cancer Sister   . Other Sister        Valve replacement (aortic & mitral ) in 2 sisters  . Heart disease Sister        Before age 19  . Heart disease Brother        aortic & mitral valve replacement in 2 bro; both had CBAG  .  Diabetes Neg Hx     Surgical History Past Surgical History:  Procedure Laterality Date  . ABDOMINAL HYSTERECTOMY  1973   age 80 due to dysfuctional menses; HRT x 25-30 years  . APPENDECTOMY  1952  . basal cell cancer  03/2006   leg  . BILATERAL OOPHORECTOMY  1990   prophylactically (sister had ovarian ca)  . [place]   right  . CATARACT EXTRACTION, BILATERAL  2010   Dr Kathrin Penner  . CHOLECYSTECTOMY  2006  . COLONOSCOPY     Dr Ardis Hughs  . EYE SURGERY     Bilateral eye  . NM MYOVIEW LTD  06/13/2008   low risk scan  . ORTHOPEDIC SURGERY  1989/200/2002/2012   elbows,shoulder surgery x 3, right hand  . RECTOCELE REPAIR  2016  . TONSILLECTOMY  1957  . US ECHOCARDIOGRAPHY  05/01/2010   trace MR,AI,TR;EF =>55%    Allergies  Allergen Reactions  . Aspirin Nausea  Only    REACTION: GI upset  ( pt can take 81 mg but NOT   325 mg ASA )  . Lovastatin Nausea Only    REACTION: nausea  . Benazepril Hcl   . Paroxetine   . Bupropion Hcl Other (See Comments)    REACTION: tinnitis  . Ezetimibe Nausea And Vomiting    REACTION: GI symptoms  . Fenofibrate Other (See Comments)    Myalgia   . Pravastatin Sodium Other (See Comments)    REACTION: elevated CPK - Muscle's in bilateral Leg    Current Outpatient Prescriptions  Medication Sig Dispense Refill  . amLODipine (NORVASC) 2.5 MG tablet Take 1 tablet (2.5 mg total) by mouth daily. 90 tablet 3  . aspirin 81 MG tablet Take 81 mg by mouth daily.      Marland Kitchen b complex vitamins tablet Take 1 tablet by mouth daily.      . Calcium Carbonate (CALTRATE 600 PO) Take 1 tablet by mouth 2 (two) times daily.    . cholecalciferol (VITAMIN D) 1000 units tablet Take 1,000 Units by mouth daily.    . cyclobenzaprine (FLEXERIL) 10 MG tablet Take 10 mg by mouth as needed for muscle spasms.    . diclofenac sodium (VOLTAREN) 1 % GEL Apply topically as needed.    Marland Kitchen levothyroxine (SYNTHROID) 88 MCG tablet Take 88 mcg by mouth daily. Taking the brand "synthroid"    . LORazepam (ATIVAN) 0.5 MG tablet Take 1 tablet (0.5 mg total) by mouth 3 (three) times daily as needed. 90 tablet 1  . losartan (COZAAR) 100 MG tablet Take 50 mg by mouth daily.    . meclizine (ANTIVERT) 25 MG tablet Take 1 tablet (25 mg total) by mouth daily. 1/2 every 8 hrs prn for imbalance (Patient taking differently: Take 12.5 mg by mouth 3 (three) times daily as needed. 1/2 every 8 hrs prn for imbalance) 30 tablet 2  . metoprolol (LOPRESSOR) 50 MG tablet Take one-half tablet by  mouth twice a day 90 tablet 3  . Multiple Vitamins-Minerals (CENTRUM SILVER PO) Take by mouth.      . predniSONE (DELTASONE) 10 MG tablet TK 1 T PO D  1  . Probiotic Product (PROBIOTIC PO) Take 1 tablet by mouth daily.    . traMADol (ULTRAM) 50 MG tablet 1/2-1 by mouth once daily as needed.  90 tablet 1  . vitamin E 100 UNIT capsule Take 100 Units by mouth daily.     . cycloSPORINE (RESTASIS) 0.05 % ophthalmic emulsion Place 1  drop into both eyes 2 (two) times daily.      Current Facility-Administered Medications  Medication Dose Route Frequency Provider Last Rate Last Dose  . 0.9 %  sodium chloride infusion  500 mL Intravenous Continuous Milus Banister, MD        Review of Systems : See HPI for pertinent positives and negatives.  Physical Examination  Vitals:   11/14/16 1536  BP: 98/67  Pulse: 65  Resp: 20  Temp: 97.2 F (36.2 C)  TempSrc: Oral  SpO2: 100%  Weight: 110 lb (49.9 kg)  Height: 5\' 5"  (1.651 m)   Body mass index is 18.3 kg/m.  General: WDWN female in NAD  GAIT: normal  Eyes: PERRLA. Ecchymosis inside left orbit. Pulmonary: Respirations are non labored, CTAB, non labored respirations.  Cardiac: regular rhythm, no murmur detected.   VASCULAR EXAM  Carotid Bruits  Left  Right    Negative  Negative    Aorta is not palpable.  Radial pulses are 2+ palpable and equal.   LE Pulses  LEFT  RIGHT   POPLITEAL  not palpable  not palpable   POSTERIOR TIBIAL  not palpable   palpable   DORSALIS PEDIS  ANTERIOR TIBIAL  palpable  Faintly palpable    Gastrointestinal: soft, nontender, BS WNL, no r/g, no palpable masses.  Musculoskeletal: No muscle atrophy/wasting. M/S 5/5 throughout, Extremities without ischemic changes.  Neurologic: A&O X 3; Appropriate Affect, Speech is normal  CN 2-12 intact, Pain and light touch intact in extremities, Motor exam as listed above    Assessment: BYRON TIPPING is a 76 y.o. female  who presents with asymptomatic minimal bilateral ICA stenosis. Elevated velocity noted in the distal left internal carotid artery most likely due to vessel tortuosity. No significant change in comparison to the exams on 10-14-2013, 10-20-14, and 10-26-15. ICA tortuosity is consistent with  fibromuscular dysplasia.  Review of records: in April 2014 carotid Duplex suggested 60-79% bilateral ICA stenoses.  Pt is hypotensive today, is taking 3 antihypertensive medications. I advised pt and her daughter to discuss this with her PCP or cardiologist.  Plan:  Follow-up in 1 year with Carotid Duplex scan.   I discussed in depth with the patient the nature of atherosclerosis, and emphasized the importance of maximal medical management including strict control of blood pressure, blood glucose, and lipid levels, obtaining regular exercise, and continued cessation of smoking.  The patient is aware that without maximal medical management the underlying atherosclerotic disease process will progress, limiting the benefit of any interventions. The patient was given information about stroke prevention and what symptoms should prompt the patient to seek immediate medical care. Thank you for allowing [place] to participate in this patient's care.  Clemon Chambers, RN, MSN, FNP-C [facility] Office: [phone]  Clinic Physician: [name]  11/14/16 3:43 PM

## 2016-11-15 ENCOUNTER — Encounter: Payer: Self-pay | Admitting: Endocrinology

## 2016-11-15 DIAGNOSIS — M21372 Foot drop, left foot: Secondary | ICD-10-CM | POA: Diagnosis not present

## 2016-11-15 DIAGNOSIS — M5136 Other intervertebral disc degeneration, lumbar region: Secondary | ICD-10-CM | POA: Diagnosis not present

## 2016-11-15 DIAGNOSIS — M1712 Unilateral primary osteoarthritis, left knee: Secondary | ICD-10-CM | POA: Diagnosis not present

## 2016-11-25 ENCOUNTER — Telehealth: Payer: Self-pay | Admitting: Cardiovascular Disease

## 2016-11-25 DIAGNOSIS — I10 Essential (primary) hypertension: Secondary | ICD-10-CM

## 2016-11-25 MED ORDER — METOPROLOL TARTRATE 50 MG PO TABS: 50.0000 mg | ORAL_TABLET | Freq: Two times a day (BID) | ORAL | 3 refills | Status: DC

## 2016-11-25 NOTE — Telephone Encounter (Signed)
Deborah Shields reports having a problem with one of her legs going numb x 4 weeks which she is being seen by a doctor for but in the last 2-3 weeks has experienced palpitations intermittently. Reports when she has a episode she becomes weak, SOB, lightheaded and has to lay down.  Reports it feels as if her heart "jumps out of rhythm".  Reports episodes occur daily "comes and goes as it pleases".  Denies BP or HR readings, doesn't take regularly.   Denies symptoms at current.    Hx: PAF (occurred pre op)-no reoccurrences since.  Reports compliance with metoprolol 25mg  BID, takes ASA 81mg  daily.    First available appt scheduled for Friday 6/8 at [place] with Almyra Deforest PA at [facility].     Patient aware and verbalized understanding.   Spoke to Dr. Louie Boston metoprolol to 50mg  BID and keep OV on Friday.  Patient aware and verbalized understanding.

## 2016-11-25 NOTE — Telephone Encounter (Signed)
New Message  Patient c/o Palpitations:  High priority if patient c/o lightheadedness and shortness of breath.  1. How long have you been having palpitations? About a week  2. Are you currently experiencing lightheadedness and shortness of breath?no   3. Are you experiencing any other symptoms?  Pt call requesting to speak with RN about getting a sooner appt than next available with Dr. Loletha Grayer only. Please call back to discuss

## 2016-11-26 DIAGNOSIS — Z961 Presence of intraocular lens: Secondary | ICD-10-CM | POA: Diagnosis not present

## 2016-11-26 DIAGNOSIS — H26492 Other secondary cataract, left eye: Secondary | ICD-10-CM | POA: Diagnosis not present

## 2016-11-26 DIAGNOSIS — H1789 Other corneal scars and opacities: Secondary | ICD-10-CM | POA: Diagnosis not present

## 2016-11-26 DIAGNOSIS — H524 Presbyopia: Secondary | ICD-10-CM | POA: Diagnosis not present

## 2016-11-28 NOTE — Addendum Note (Signed)
Addended by: Lianne Cure A on: 11/28/2016 04:58 PM   Modules accepted: Orders

## 2016-11-29 ENCOUNTER — Encounter: Payer: Self-pay | Admitting: Physician Assistant

## 2016-11-29 ENCOUNTER — Ambulatory Visit (INDEPENDENT_AMBULATORY_CARE_PROVIDER_SITE_OTHER): Payer: Medicare Other | Admitting: Physician Assistant

## 2016-11-29 VITALS — BP 81/55 | HR 64 | Ht 65.0 in | Wt 109.0 lb

## 2016-11-29 DIAGNOSIS — I952 Hypotension due to drugs: Secondary | ICD-10-CM

## 2016-11-29 DIAGNOSIS — E785 Hyperlipidemia, unspecified: Secondary | ICD-10-CM

## 2016-11-29 DIAGNOSIS — E039 Hypothyroidism, unspecified: Secondary | ICD-10-CM

## 2016-11-29 DIAGNOSIS — I779 Disorder of arteries and arterioles, unspecified: Secondary | ICD-10-CM

## 2016-11-29 DIAGNOSIS — M546 Pain in thoracic spine: Secondary | ICD-10-CM | POA: Diagnosis not present

## 2016-11-29 DIAGNOSIS — I480 Paroxysmal atrial fibrillation: Secondary | ICD-10-CM

## 2016-11-29 DIAGNOSIS — M47816 Spondylosis without myelopathy or radiculopathy, lumbar region: Secondary | ICD-10-CM | POA: Diagnosis not present

## 2016-11-29 DIAGNOSIS — R002 Palpitations: Secondary | ICD-10-CM | POA: Diagnosis not present

## 2016-11-29 DIAGNOSIS — R55 Syncope and collapse: Secondary | ICD-10-CM

## 2016-11-29 DIAGNOSIS — I10 Essential (primary) hypertension: Secondary | ICD-10-CM

## 2016-11-29 DIAGNOSIS — R29898 Other symptoms and signs involving the musculoskeletal system: Secondary | ICD-10-CM | POA: Diagnosis not present

## 2016-11-29 DIAGNOSIS — M545 Low back pain: Secondary | ICD-10-CM | POA: Diagnosis not present

## 2016-11-29 DIAGNOSIS — M5136 Other intervertebral disc degeneration, lumbar region: Secondary | ICD-10-CM | POA: Diagnosis not present

## 2016-11-29 DIAGNOSIS — M5416 Radiculopathy, lumbar region: Secondary | ICD-10-CM | POA: Diagnosis not present

## 2016-11-29 DIAGNOSIS — Z79899 Other long term (current) drug therapy: Secondary | ICD-10-CM | POA: Diagnosis not present

## 2016-11-29 DIAGNOSIS — I739 Peripheral vascular disease, unspecified: Secondary | ICD-10-CM

## 2016-11-29 LAB — BASIC METABOLIC PANEL
BUN/Creatinine Ratio: 10 — ABNORMAL LOW (ref 12–28)
BUN: 24 mg/dL (ref 8–27)
CO2: 27 mmol/L (ref 18–29)
Calcium: 11.5 mg/dL — ABNORMAL HIGH (ref 8.7–10.3)
Chloride: 94 mmol/L — ABNORMAL LOW (ref 96–106)
Creatinine, Ser: 2.31 mg/dL — ABNORMAL HIGH (ref 0.57–1.00)
GFR calc Af Amer: 23 mL/min/{1.73_m2} — ABNORMAL LOW (ref >59)
GFR calc non Af Amer: 20 mL/min/{1.73_m2} — ABNORMAL LOW (ref >59)
Glucose: 89 mg/dL (ref 65–99)
Potassium: 3.4 mmol/L — ABNORMAL LOW (ref 3.5–5.2)
Sodium: 136 mmol/L (ref 134–144)

## 2016-11-29 IMAGING — DX DG CHEST 2V
2 series · 2 of 2 positions shown · non-contrast
Comparison: 08/08/2015, 04/27/2015 and earlier.

CLINICAL DATA: 75-year-old sustained a tick bite approximately 3
weeks ago with dizziness, anorexia and generalized weakness since
that time. Current history of hypertension.

EXAM:
CHEST  2 VIEW

[chest pa]
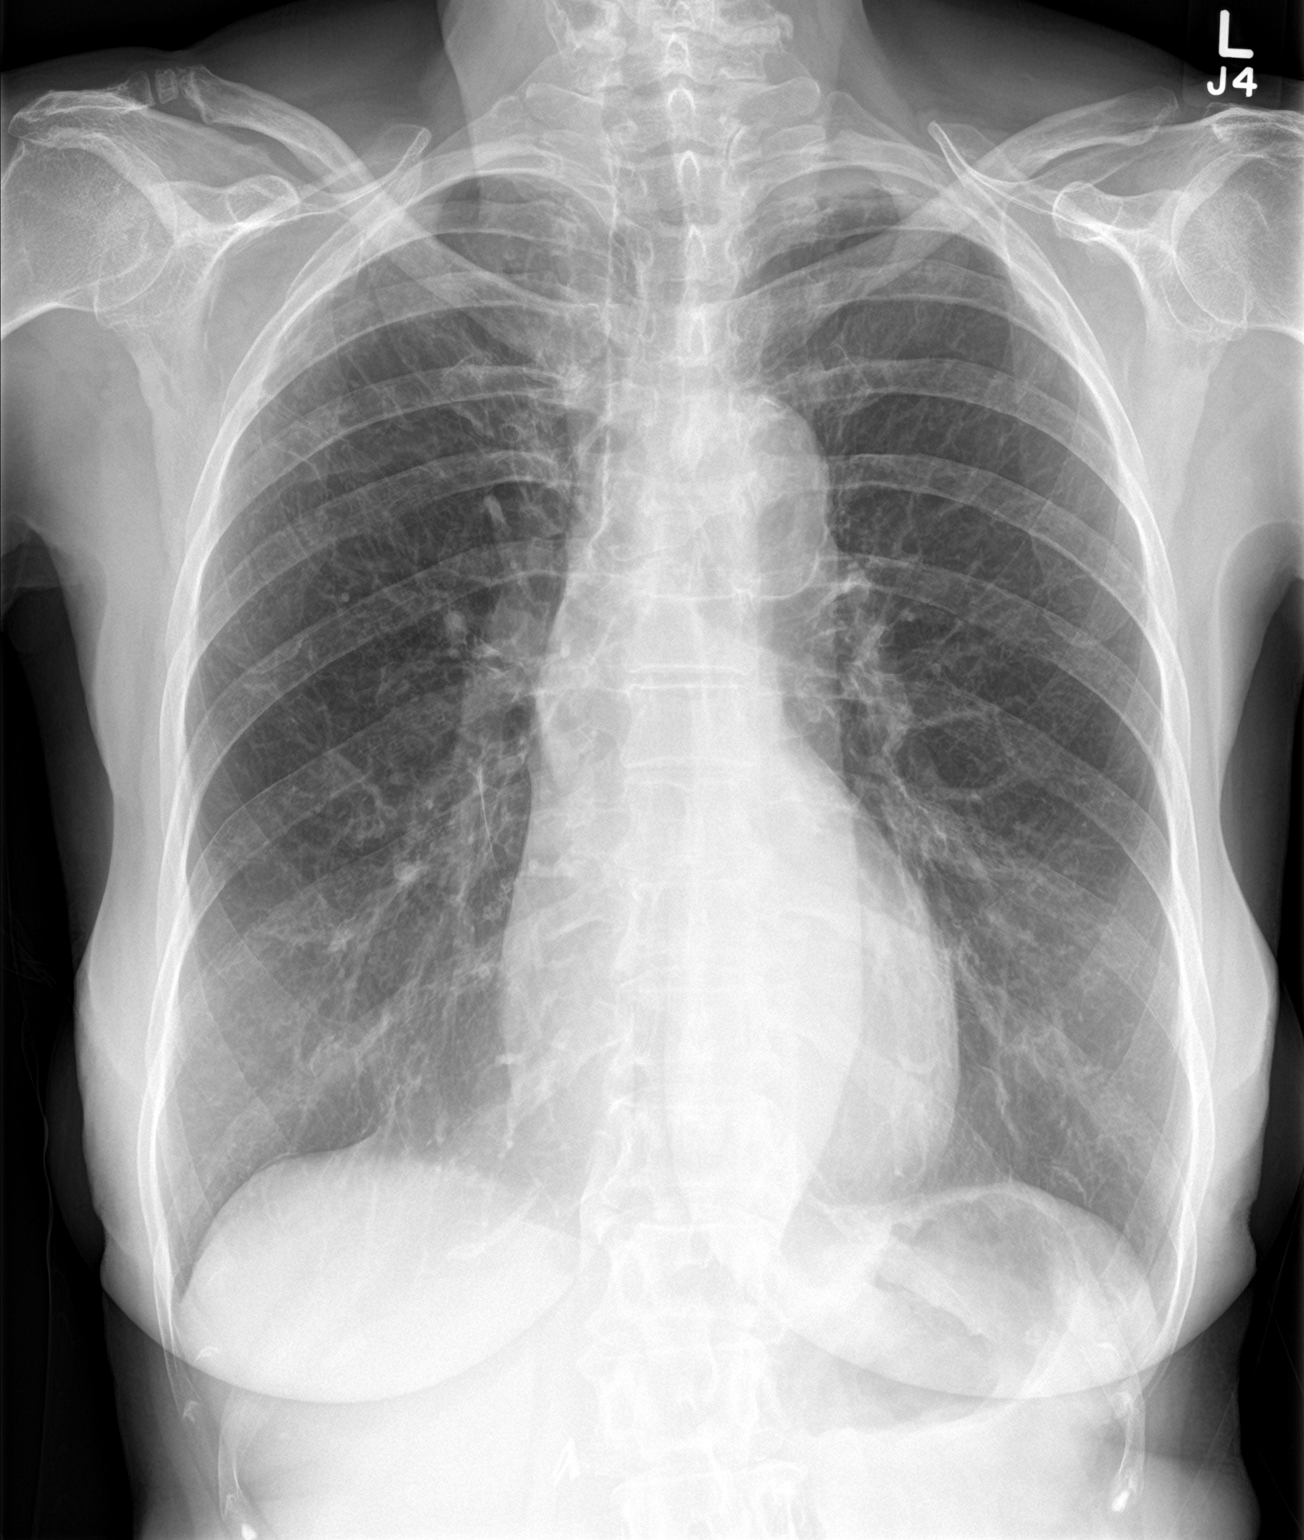

[chest lat]
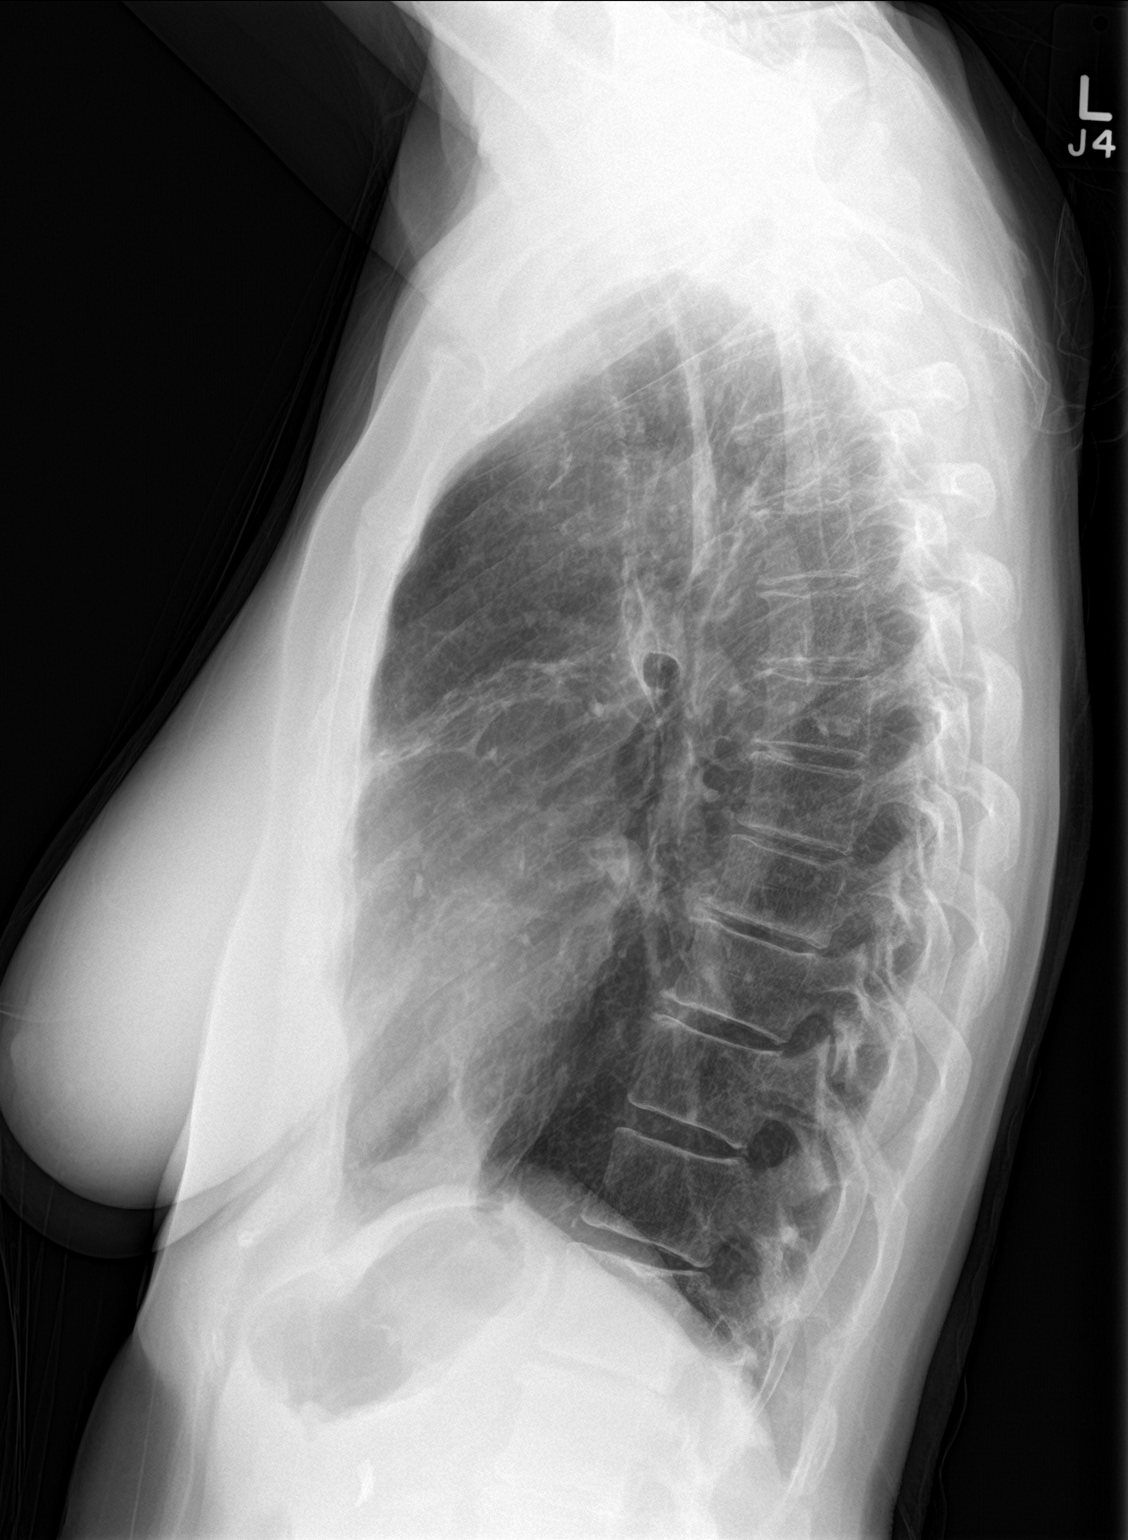

[2 of 2 positions shown; findings below may reference images not displayed]

FINDINGS: Cardiac silhouette normal in size, unchanged. Thoracic aorta
atherosclerotic, unchanged. Hilar and mediastinal contours otherwise
unremarkable. Mild hyperinflation with emphysematous changes
throughout, unchanged. Parenchymal scarring involving the right
middle lobe, unchanged. Lungs otherwise clear. No pleural effusions.
No pneumothorax. Mild degenerative disc disease and spondylosis
involving the thoracic spine.
IMPRESSION: COPD/emphysema. Stable right middle lobe scarring. No acute
cardiopulmonary disease.

## 2016-11-29 MED ORDER — METOPROLOL TARTRATE 25 MG PO TABS: 25.0000 mg | ORAL_TABLET | Freq: Two times a day (BID) | ORAL | 1 refills | Status: DC

## 2016-11-29 NOTE — Patient Instructions (Signed)
Medication Instructions:   STOP amlodipine STOP losartan  HOLD your metoprolol today.  STARTING TOMORROW, resume this medication at the dose of 25mg  TWICE DAILY if your top blood pressure number is above 105. If your BP is still low, continue to hold this medication until your BP is above 105 & if needed, call our office for further instructions.  Labwork:   BMET today to check your kidney function   Testing/Procedures:  Your physician has recommended that you wear a 30-day event monitor. Event monitors are medical devices that record the heart's electrical activity. Doctors most often [place] these monitors to diagnose arrhythmias. Arrhythmias are problems with the speed or rhythm of the heartbeat. The monitor is a small, portable device. You can wear one while you do your normal daily activities. This is usually used to diagnose what is causing palpitations/syncope (passing out).   Follow-Up:  With [facility] or other PA early next week (Monday, Tuesday, or Wednesday).    If you need a refill on your cardiac medications before your next appointment, please call your pharmacy.

## 2016-11-29 NOTE — Progress Notes (Signed)
Cardiology Office Note    Date:  11/29/2016   ID:  Deborah, Shields 06/20/1941, MRN [id number]  PCP:  Anda Kraft, MD  Cardiologist:  Dr. Sallyanne Kuster  Chief Complaint  Patient presents with  . Follow-up    palpitaions and syncope. Seen for Dr. Sallyanne Kuster    History of Present Illness:  Deborah Shields is a 76 y.o. female with PMH of remote PAF, carotid artery disease, HTN, HLD, hypothyroidism and mild AI. She does not appear to have past medical history of coronary artery disease. Last echocardiogram obtained on 03/09/2014 showed EF 78-93%, grade 1 diastolic dysfunction, trivial AI, mild TR, PA peak pressure 28 mmHg. Last Myoview obtained on 03/09/2014 showed EF 67%, normal perfusion. Based on recent telephone note, it appears she has been having recurrent palpitation.  Patient presents today for cardiology office evaluation. Her initial blood pressure was 81/55. I did a manual recheck myself it was 80/50. We will keep her in the office to initiate oral hydration, if her systolic blood pressure is unable to come up above 90 mmHg, I will initiate IV hydration. She says she has been having palpitation for the past 4 weeks. It has been worsening for the past week. In past week alone, she has passed out at least 4 times. She does not know how long she has passed out for, and often states it is only a minute, however family member think it may be longer. I will obtain a basic metabolic panel today to assess her renal function. I would not be too surprised if she has acute kidney injury by this point. She has also misunderstood our instruction to increase metoprolol, she is still on 25 mg twice a day of metoprolol, but instead she has doubled up on her amlodipine instead. Given the hypotension, I will discontinue her amlodipine and losartan completely. I recommended holding tonight's dose of metoprolol and resume 25 mg twice a day Metoprolol tomorrow if her systolic blood pressures >810. I will try to  see her back before next Wednesday for reassessment. She will also need a 30 day event monitor given multiple passing out spells recently. Some of the syncope is likely related to hypotension, however I am unable to rule out potential arrhythmia including atrial fibrillation with RVR or bradycardia. Family also question whether she has been taking too much medication recently.  Family says she has x-ray and appointment with Dr. Sherwood Gambler her neurosurgeon around noon, however I recommended to bring her blood pressure up prior to that. I think Dr. Sherwood Gambler would agree with improving her blood pressure being the top priority at this point.   Past Medical History:  Diagnosis Date  . Anxiety   . Aortic insufficiency    mild due to degenerative changes  . Arthritis   . Basal cell carcinoma 2007   [facility] Left leg  . Carotid artery occlusion   . Conjunctivitis due to adenovirus, both eyes   . Degenerative joint disease   . Diverticulosis of colon   . Heart murmur   . Hyperlipidemia    NMR 07/2009: LDL 200 (2260/1233)TG 99, HDL 65. LDL goal =<120, ideally <100. father MI @ 31  . Hypertension   . Hypothyroidism    Dr Wilson Singer  . Microscopic hematuria   . Peripheral neuropathy    compressive in UE bilaterally; Dr Daylene Katayama  . Peripheral vascular disease (HCC)    ICA bilat, Dr.Charles fields, [facility]  . Pneumonia   . Rectocele   .  [place] spotted fever     Past Surgical History:  Procedure Laterality Date  . ABDOMINAL HYSTERECTOMY  1973   age 37 due to dysfuctional menses; HRT x 25-30 years  . APPENDECTOMY  1952  . basal cell cancer  03/2006   leg  . BILATERAL OOPHORECTOMY  1990   prophylactically (sister had ovarian ca)  . [place]   right  . CATARACT EXTRACTION, BILATERAL  2010   Dr Kathrin Penner  . CHOLECYSTECTOMY  2006  . COLONOSCOPY     Dr Ardis Hughs  . EYE SURGERY     Bilateral eye  . NM MYOVIEW LTD  06/13/2008   low risk scan  . ORTHOPEDIC SURGERY   1989/200/2002/2012   elbows,shoulder surgery x 3, right hand  . RECTOCELE REPAIR  2016  . TONSILLECTOMY  1957  . US ECHOCARDIOGRAPHY  05/01/2010   trace MR,AI,TR;EF =>55%    Current Medications: Outpatient Medications Prior to Visit  Medication Sig Dispense Refill  . aspirin 81 MG tablet Take 81 mg by mouth daily.      Marland Kitchen b complex vitamins tablet Take 1 tablet by mouth daily.      . Calcium Carbonate (CALTRATE 600 PO) Take 1 tablet by mouth 2 (two) times daily.    . cholecalciferol (VITAMIN D) 1000 units tablet Take 1,000 Units by mouth daily.    . cyclobenzaprine (FLEXERIL) 10 MG tablet Take 10 mg by mouth as needed for muscle spasms.    . diclofenac sodium (VOLTAREN) 1 % GEL Apply topically as needed.    Marland Kitchen levothyroxine (SYNTHROID) 88 MCG tablet Take 88 mcg by mouth daily. Taking the brand "synthroid"    . LORazepam (ATIVAN) 0.5 MG tablet Take 1 tablet (0.5 mg total) by mouth 3 (three) times daily as needed. 90 tablet 1  . meclizine (ANTIVERT) 25 MG tablet Take 1 tablet (25 mg total) by mouth daily. 1/2 every 8 hrs prn for imbalance (Patient taking differently: Take 12.5 mg by mouth 3 (three) times daily as needed. 1/2 every 8 hrs prn for imbalance) 30 tablet 2  . Multiple Vitamins-Minerals (CENTRUM SILVER PO) Take by mouth.      . predniSONE (DELTASONE) 10 MG tablet TK 1 T PO D  1  . Probiotic Product (PROBIOTIC PO) Take 1 tablet by mouth daily.    . traMADol (ULTRAM) 50 MG tablet 1/2-1 by mouth once daily as needed. 90 tablet 1  . vitamin E 100 UNIT capsule Take 100 Units by mouth daily.     Marland Kitchen amLODipine (NORVASC) 2.5 MG tablet Take 1 tablet (2.5 mg total) by mouth daily. 90 tablet 3  . losartan (COZAAR) 100 MG tablet Take 50 mg by mouth daily.    . metoprolol tartrate (LOPRESSOR) 50 MG tablet Take 1 tablet (50 mg total) by mouth 2 (two) times daily. 90 tablet 3  . cycloSPORINE (RESTASIS) 0.05 % ophthalmic emulsion Place 1 drop into both eyes 2 (two) times daily.       Facility-Administered Medications Prior to Visit  Medication Dose Route Frequency Provider Last Rate Last Dose  . 0.9 %  sodium chloride infusion  500 mL Intravenous Continuous Milus Banister, MD         Allergies:   Aspirin; Lovastatin; Benazepril hcl; Paroxetine; Bupropion hcl; Ezetimibe; Fenofibrate; and Pravastatin sodium   Social History   Social History  . Marital status: Widowed    Spouse name: N/A  . Number of children: 1  . Years of education:  N/A   Occupational History  . retired    Social History Main Topics  . Smoking status: Never Smoker  . Smokeless tobacco: Never Used  . Alcohol use No  . Drug use: No  . Sexual activity: Not Asked   Other Topics Concern  . None   Social History Narrative   Low cholesterol diet   Regular exercise- yes      Family History:  The patient's family history includes Cancer - Colon (age of onset: 60) in her mother; Colon cancer (age of onset: 29) in her mother; Heart attack (age of onset: 25) in her father; Heart disease in her brother, father, and sister; Kidney disease in her mother; Other in her sister; Ovarian cancer in her sister; Stroke (age of onset: 26) in her mother.   ROS:   Please see the history of present illness.    ROS All other systems reviewed and are negative.   PHYSICAL EXAM:   VS:  BP (!) 81/55   Pulse 64   Ht 5\' 5"  (1.651 m)   Wt 109 lb (49.4 kg)   BMI 18.14 kg/m    GEN: Well nourished, well developed, in no acute distress  HEENT: normal  Neck: no JVD, carotid bruits, or masses Cardiac: RRR; no murmurs, rubs, or gallops,no edema  Respiratory:  clear to auscultation bilaterally, normal work of breathing GI: soft, nontender, nondistended, + BS MS: no deformity or atrophy  Skin: warm and dry, no rash Neuro:  Alert and Oriented x 3, Strength and sensation are intact Psych: euthymic mood, full affect  Wt Readings from Last 3 Encounters:  11/29/16 109 lb (49.4 kg)  11/14/16 110 lb (49.9 kg)   08/26/16 116 lb (52.6 kg)      Studies/Labs Reviewed:   EKG:  EKG is ordered today.  The ekg ordered today demonstrates Normal sinus rhythm without significant ST-T wave changes.  Recent Labs: 11/29/2016: BUN 24; Creatinine, Ser 2.31; Potassium 3.4; Sodium 136   Lipid Panel    Component Value Date/Time   CHOL 239 (H) 08/04/2014 1109   TRIG 135.0 08/04/2014 1109   TRIG 77 05/21/2006 1321   HDL 57.70 08/04/2014 1109   CHOLHDL 4 08/04/2014 1109   VLDL 27.0 08/04/2014 1109   LDLCALC 154 (H) 08/04/2014 1109   LDLDIRECT 169.9 01/16/2011 0854    Additional studies/ records that were reviewed today include:   Echo 03/09/2014 LV EF: 60% -  65%  Study Conclusions  - Left ventricle: The cavity size was normal. Wall thickness was normal. Systolic function was normal. The estimated ejection fraction was in the range of 60% to 65%. Wall motion was normal; there were no regional wall motion abnormalities. Doppler parameters are consistent with abnormal left ventricular relaxation (grade 1 diastolic dysfunction). The E/e&' ratio is between 8-15, suggesting indeterminate LV Filling pressure. - Aortic valve: Trileaflet. Sclerosis without stenosis. There was trivial regurgitation. - Left atrium: LA volume/ BSA = 35 ml/m2. The atrium was mildly dilated. - Tricuspid valve: There was mild regurgitation. - Pulmonary arteries: PA peak pressure: 28 mm Hg (S). - Inferior vena cava: The vessel was normal in size. The respirophasic diameter changes were in the normal range (= 50%), consistent with normal central venous pressure.  Impressions:  - LVEF 60-65%, normal wall thickness, normal wall motion, diastolic dysfunction, indeterminate filling pressure, aortic sclerosis with trivial AI, mild LAE.   Myoview 03/09/2014 Impression Exercise Capacity:  Lexiscan with no exercise. BP Response:  Normal blood pressure response. Clinical  Symptoms:  Mild chest  pain/dyspnea. ECG Impression:  No significant ST segment change suggestive of ischemia. Comparison with Prior Nuclear Study: No significant change from previous study  Overall Impression:  Normal stress nuclear study.  LV Wall Motion:  NL LV Function, EF 67%; NL Wall Motion   ASSESSMENT:    1. Hypotension due to drugs   2. Palpitations   3. Essential hypertension   4. Encounter for long-term (current) use of medications   5. Syncope, unspecified syncope type   6. PAF (paroxysmal atrial fibrillation) (HCC)   7. Carotid artery disease, unspecified laterality ([place])   8. Hyperlipidemia, unspecified hyperlipidemia type   9. Hypothyroidism, unspecified type      PLAN:  In order of problems listed above:  1. Hypotension  - Blood pressure was initially 80/50. I kept her in the office for observation and gave her oral hydration and food. I rechecked her blood pressure every half hour, after roughly an hour, her systolic blood pressure came up to 105. I will obtain a basic metabolic panel today, I would not be too surprised if she has acute kidney injury given persistent hypotension. According to the family, there is some question whether she has been taking more medications than prescribed. For now, I will stop her amlodipine and losartan. She is to hold tonight's dose of metoprolol and only restart tomorrow at 25 mg twice a day if her systolic blood pressures greater than 105.   2. Recurrent syncope: She says in the past 2 months, she has roughly passed out at least 8 times, in the past week, she has passed out 4 times. She does not think she has passed out. She does have a bruise under her left eye, family says this was the result of a car door swing open into her face. She has been having recent palpitations. It is very possible that her recurrent syncope is related to hypotension. However with recent palpitation, I also want to rule out arrhythmia, I recommend a 30 day event  monitor.  3. Palpitation: Recent palpitation, given history of remote history of paroxysmal atrial fibrillation, I recommend a 30 day event monitor. She is actually maintaining normal sinus rhythm in the office today. According to recent phone conversation, she was instructed to increase her metoprolol to 50 mg twice a day, instead she misunderstood the instructions and have increased her amlodipine from 2.5 to 5 mg daily instead. Note, I did stop her amlodipine and losartan today. Instead of having her taking 50 mg twice a day dosing of metoprolol, I am concerned about her blood pressure, I will resume 25 mg twice a day of metoprolol tomorrow if her systolic blood pressure is greater than 105. I will see her back within 5 days, once her blood pressure has come up and if she continued to have palpitation, then we can consider increase metoprolol later.  4. H/o PAF: I have reviewed the previous EKG, I could not locate an EKG showing paroxysmal atrial fibrillation. Apparently she only had a remotely and has no recent recurrence other than palpitation. She is currently on aspirin. It seems family is suggesting she may have been taking more medication than she is prescribed, until we can be sure that she is not overdosing herself, even if she has recurrent atrial fibrillation on the 30 day monitor, I do not think she is a very good candidate for systemic anticoagulation therapy.  5. Carotid artery disease: Recent carotid ultrasound obtained of 11/14/2016, no significant change  compared to the previous ultrasound. Mild disease.   6. Hypertension: Severely hypotensive this morning, see #1 above.   7. Hyperlipidemia: Previous elevated CPK on pravastatin. Also GI symptom on Ezetimibe and myalgia on fenofibrate. She has nausea on lovastatin. She is not currently on statin medication  8. Hypothyroidism: On Synthroid  As stated in history of present illness, I clearly spent longer than 40 minutes taking care of  Deborah Shields this morning due to severe hypotension. Fortunately she recovered and does not need immediate evaluation in the emergency room. I plan to see her back in the office within 5 days for close observation and medication adjustment. Low threshold to send to the emergency room if she has any deterioration.   Medication Adjustments/Labs and Tests Ordered: Current medicines are reviewed at length with the patient today.  Concerns regarding medicines are outlined above.  Medication changes, Labs and Tests ordered today are listed in the Patient Instructions below. Patient Instructions  Medication Instructions:   STOP amlodipine STOP losartan  HOLD your metoprolol today.  STARTING TOMORROW, resume this medication at the dose of 25mg  TWICE DAILY if your top blood pressure number is above 105. If your BP is still low, continue to hold this medication until your BP is above 105 & if needed, call our office for further instructions.  Labwork:   BMET today to check your kidney function   Testing/Procedures:  Your physician has recommended that you wear a 30-day event monitor. Event monitors are medical devices that record the heart's electrical activity. Doctors most often [place] these monitors to diagnose arrhythmias. Arrhythmias are problems with the speed or rhythm of the heartbeat. The monitor is a small, portable device. You can wear one while you do your normal daily activities. This is usually used to diagnose what is causing palpitations/syncope (passing out).   Follow-Up:  With [facility] or other PA early next week (Monday, Tuesday, or Wednesday).    If you need a refill on your cardiac medications before your next appointment, please call your pharmacy.      Hilbert Corrigan, [place]  11/29/2016 11:02 PM    [facility] [address], [place], [place]  [address] Phone: [phone]; Fax: [phone]

## 2016-12-02 ENCOUNTER — Ambulatory Visit (INDEPENDENT_AMBULATORY_CARE_PROVIDER_SITE_OTHER): Payer: Medicare Other | Admitting: Physician Assistant

## 2016-12-02 ENCOUNTER — Encounter: Payer: Self-pay | Admitting: Physician Assistant

## 2016-12-02 ENCOUNTER — Telehealth: Payer: Self-pay | Admitting: *Deleted

## 2016-12-02 VITALS — BP 102/56 | HR 84 | Ht 65.0 in | Wt 111.0 lb

## 2016-12-02 DIAGNOSIS — Z79899 Other long term (current) drug therapy: Secondary | ICD-10-CM

## 2016-12-02 DIAGNOSIS — I10 Essential (primary) hypertension: Secondary | ICD-10-CM

## 2016-12-02 DIAGNOSIS — R002 Palpitations: Secondary | ICD-10-CM | POA: Diagnosis not present

## 2016-12-02 DIAGNOSIS — I952 Hypotension due to drugs: Secondary | ICD-10-CM | POA: Diagnosis not present

## 2016-12-02 DIAGNOSIS — R55 Syncope and collapse: Secondary | ICD-10-CM | POA: Diagnosis not present

## 2016-12-02 DIAGNOSIS — N189 Chronic kidney disease, unspecified: Secondary | ICD-10-CM | POA: Diagnosis not present

## 2016-12-02 DIAGNOSIS — E785 Hyperlipidemia, unspecified: Secondary | ICD-10-CM

## 2016-12-02 DIAGNOSIS — I779 Disorder of arteries and arterioles, unspecified: Secondary | ICD-10-CM | POA: Diagnosis not present

## 2016-12-02 DIAGNOSIS — I739 Peripheral vascular disease, unspecified: Secondary | ICD-10-CM

## 2016-12-02 DIAGNOSIS — E039 Hypothyroidism, unspecified: Secondary | ICD-10-CM

## 2016-12-02 DIAGNOSIS — N289 Disorder of kidney and ureter, unspecified: Secondary | ICD-10-CM | POA: Diagnosis not present

## 2016-12-02 NOTE — Telephone Encounter (Signed)
[name] (daughter) [phone] requested call - she is caregiver and listed DPR for patient.  She wanted to clarify w Isaac Laud if patient needs to be on oral calcium. Noted high serum calcium level w last BMET (11.5). Informed her I would check w Isaac Laud for any needed med adjustments and contact her at home.

## 2016-12-02 NOTE — Progress Notes (Signed)
Cardiology Office Note    Date:  12/03/2016   ID:  Deborah Shields, Deborah Shields 06/23/1941, MRN [id number]  PCP:  Anda Kraft, MD  Cardiologist:  Dr. Sallyanne Kuster   Chief Complaint  Patient presents with  . Follow-up    seen for Dr. Sallyanne Kuster    History of Present Illness:  Deborah Shields is a 76 y.o. female with PMH of remote PAF, carotid artery disease, HTN, HLD, hypothyroidism and mild AI. She does not appear to have past medical history of coronary artery disease. Last echocardiogram obtained on 03/09/2014 showed EF 32-95%, grade 1 diastolic dysfunction, trivial AI, mild TR, PA peak pressure 28 mmHg. Last Myoview obtained on 03/09/2014 showed EF 67%, normal perfusion. Based on recent telephone note, it appears she has been having recurrent palpitation.  I last saw the patient last Friday on 11/29/2016. Her blood pressure was 80/50 at the time. I gave her oral hydration with blood pressure improving to greater than 105 before I let her leave the office. I also discontinued her amlodipine and losartan completely. Although initially we have instructed her to double up on her metoprolol due to palpitation, she misunderstood this and was instead taking double the dose of amlodipine. We started her metoprolol at a higher dose, due to severe hypotension, I asked her to hold metoprolol on the day of visit and to restart metoprolol at 25 mg twice a day starting 07/01/8414 once her systolic blood pressures greater than 105.  She presents today for follow-up, her blood pressure has come up nicely. She is no longer having any dizziness. She has not had any passing out spells since the last time I saw her. Her blood pressure unfortunately still borderline, I am hesitant to increase metoprolol today to control her palpitation. I recommended her to continue with the heart monitor for evaluation. If there is any significant finding on the heart monitor then we can consider adjusting her medication and even consider  antiarrhythmic medication to avoid significant drop in her blood pressure. Otherwise, last Friday's lab does show that she has acute on chronic kidney injury with creatinine increasing from the baseline of 1.3 up to 2.3. This is expected given likely persistent low blood pressure for several weeks. Now her blood pressure has come up, I expect her renal function also gradually improve as well. I recommended serial basic metabolic panel this Friday and also next Friday to further monitor her renal function. We will see her back in 2 months after her heart monitor at which time we can decide on the appropriate dose of her rate control therapy.    Past Medical History:  Diagnosis Date  . Anxiety   . Aortic insufficiency    mild due to degenerative changes  . Arthritis   . Basal cell carcinoma 2007   [facility] Left leg  . Carotid artery occlusion   . Conjunctivitis due to adenovirus, both eyes   . Degenerative joint disease   . Diverticulosis of colon   . Heart murmur   . Hyperlipidemia    NMR 07/2009: LDL 200 (2260/1233)TG 99, HDL 65. LDL goal =<120, ideally <100. father MI @ 43  . Hypertension   . Hypothyroidism    Dr Wilson Singer  . Microscopic hematuria   . Peripheral neuropathy    compressive in UE bilaterally; Dr Daylene Katayama  . Peripheral vascular disease (HCC)    ICA bilat, Dr.Charles fields, [facility]  . Pneumonia   . Rectocele   . [facility] spotted  fever     Past Surgical History:  Procedure Laterality Date  . ABDOMINAL HYSTERECTOMY  1973   age 29 due to dysfuctional menses; HRT x 25-30 years  . APPENDECTOMY  1952  . basal cell cancer  03/2006   leg  . BILATERAL OOPHORECTOMY  1990   prophylactically (sister had ovarian ca)  . [place]   right  . CATARACT EXTRACTION, BILATERAL  2010   Dr Kathrin Penner  . CHOLECYSTECTOMY  2006  . COLONOSCOPY     Dr Ardis Hughs  . EYE SURGERY     Bilateral eye  . NM MYOVIEW LTD  06/13/2008   low risk scan  . ORTHOPEDIC SURGERY   1989/200/2002/2012   elbows,shoulder surgery x 3, right hand  . RECTOCELE REPAIR  2016  . TONSILLECTOMY  1957  . US ECHOCARDIOGRAPHY  05/01/2010   trace MR,AI,TR;EF =>55%    Current Medications: Outpatient Medications Prior to Visit  Medication Sig Dispense Refill  . ALPRAZolam (XANAX) 0.5 MG tablet Rarely takes    . aspirin 81 MG tablet Take 81 mg by mouth daily.      Marland Kitchen b complex vitamins tablet Take 1 tablet by mouth daily.      . Calcium Carbonate (CALTRATE 600 PO) Take 1 tablet by mouth 2 (two) times daily.    . cholecalciferol (VITAMIN D) 1000 units tablet Take 1,000 Units by mouth daily.    . cyclobenzaprine (FLEXERIL) 10 MG tablet Take 10 mg by mouth as needed for muscle spasms.    . diclofenac sodium (VOLTAREN) 1 % GEL Apply topically as needed.    Marland Kitchen levothyroxine (SYNTHROID) 88 MCG tablet Take 88 mcg by mouth daily. Taking the brand "synthroid"    . LORazepam (ATIVAN) 0.5 MG tablet Take 1 tablet (0.5 mg total) by mouth 3 (three) times daily as needed. 90 tablet 1  . meclizine (ANTIVERT) 25 MG tablet Take 1 tablet (25 mg total) by mouth daily. 1/2 every 8 hrs prn for imbalance (Patient taking differently: Take 12.5 mg by mouth 3 (three) times daily as needed. 1/2 every 8 hrs prn for imbalance) 30 tablet 2  . metoprolol tartrate (LOPRESSOR) 25 MG tablet Take 1 tablet (25 mg total) by mouth 2 (two) times daily. 180 tablet 1  . Multiple Vitamins-Minerals (CENTRUM SILVER PO) Take by mouth.      . predniSONE (DELTASONE) 10 MG tablet TK 1 T PO D  1  . Probiotic Product (PROBIOTIC PO) Take 1 tablet by mouth daily.    . traMADol (ULTRAM) 50 MG tablet 1/2-1 by mouth once daily as needed. 90 tablet 1  . vitamin E 100 UNIT capsule Take 100 Units by mouth daily.     Marland Kitchen amLODipine (NORVASC) 5 MG tablet Take 1 tablet by mouth daily.     Facility-Administered Medications Prior to Visit  Medication Dose Route Frequency Provider Last Rate Last Dose  . 0.9 %  sodium chloride infusion  500 mL  Intravenous Continuous Milus Banister, MD         Allergies:   Aspirin; Lovastatin; Benazepril hcl; Paroxetine; Bupropion hcl; Ezetimibe; Fenofibrate; and Pravastatin sodium   Social History   Social History  . Marital status: Widowed    Spouse name: N/A  . Number of children: 1  . Years of education: N/A   Occupational History  . retired    Social History Main Topics  . Smoking status: Never Smoker  . Smokeless tobacco: Never Used  . Alcohol use No  .  Drug use: No  . Sexual activity: Not Asked   Other Topics Concern  . None   Social History Narrative   Low cholesterol diet   Regular exercise- yes      Family History:  The patient's family history includes Cancer - Colon (age of onset: 52) in her mother; Colon cancer (age of onset: 77) in her mother; Heart attack (age of onset: 79) in her father; Heart disease in her brother, father, and sister; Kidney disease in her mother; Other in her sister; Ovarian cancer in her sister; Stroke (age of onset: 80) in her mother.   ROS:   Please see the history of present illness.    ROS All other systems reviewed and are negative.   PHYSICAL EXAM:   VS:  BP (!) 102/56 (BP Location: Right Arm, Patient Position: Sitting, Cuff Size: Normal)   Pulse 84   Ht 5\' 5"  (1.651 m)   Wt 111 lb (50.3 kg)   BMI 18.47 kg/m    GEN: Well nourished, well developed, in no acute distress  HEENT: normal  Neck: no JVD, carotid bruits, or masses Cardiac: RRR; no murmurs, rubs, or gallops,no edema  Respiratory:  clear to auscultation bilaterally, normal work of breathing GI: soft, nontender, nondistended, + BS MS: no deformity or atrophy  Skin: warm and dry, no rash Neuro:  Alert and Oriented x 3, Strength and sensation are intact Psych: euthymic mood, full affect  Wt Readings from Last 3 Encounters:  12/02/16 111 lb (50.3 kg)  11/29/16 109 lb (49.4 kg)  11/14/16 110 lb (49.9 kg)      Studies/Labs Reviewed:   EKG:  EKG is not ordered  today.    Recent Labs: 11/29/2016: BUN 24; Creatinine, Ser 2.31; Potassium 3.4; Sodium 136   Lipid Panel    Component Value Date/Time   CHOL 239 (H) 08/04/2014 1109   TRIG 135.0 08/04/2014 1109   TRIG 77 05/21/2006 1321   HDL 57.70 08/04/2014 1109   CHOLHDL 4 08/04/2014 1109   VLDL 27.0 08/04/2014 1109   LDLCALC 154 (H) 08/04/2014 1109   LDLDIRECT 169.9 01/16/2011 0854    Additional studies/ records that were reviewed today include:   Echo 03/09/2014 LV EF: 60% -  65%  Study Conclusions  - Left ventricle: The cavity size was normal. Wall thickness was normal. Systolic function was normal. The estimated ejection fraction was in the range of 60% to 65%. Wall motion was normal; there were no regional wall motion abnormalities. Doppler parameters are consistent with abnormal left ventricular relaxation (grade 1 diastolic dysfunction). The E/e&' ratio is between 8-15, suggesting indeterminate LV Filling pressure. - Aortic valve: Trileaflet. Sclerosis without stenosis. There was trivial regurgitation. - Left atrium: LA volume/ BSA = 35 ml/m2. The atrium was mildly dilated. - Tricuspid valve: There was mild regurgitation. - Pulmonary arteries: PA peak pressure: 28 mm Hg (S). - Inferior vena cava: The vessel was normal in size. The respirophasic diameter changes were in the normal range (= 50%), consistent with normal central venous pressure.  Impressions:  - LVEF 60-65%, normal wall thickness, normal wall motion, diastolic dysfunction, indeterminate filling pressure, aortic sclerosis with trivial AI, mild LAE.   Myoview 03/09/2014 Impression Exercise Capacity: Lexiscan with no exercise. BP Response: Normal blood pressure response. Clinical Symptoms: Mild chest pain/dyspnea. ECG Impression: No significant ST segment change suggestive of ischemia. Comparison with Prior Nuclear Study: No significant change from previous study  Overall  Impression: Normal stress nuclear study.  LV Wall Motion:  NL LV Function, EF 67%; NL Wall Motion   ASSESSMENT:    1. Hypotension due to drugs   2. Encounter for long-term (current) use of medications   3. Syncope, unspecified syncope type   4. Palpitation   5. Essential hypertension   6. Hyperlipidemia, unspecified hyperlipidemia type   7. Hypothyroidism, unspecified type   8. Carotid artery disease, unspecified laterality ([place])   9. Acute on chronic renal insufficiency      PLAN:  In order of problems listed above:  1. Hypotension: Blood pressure has improved after holding amlodipine and losartan. We'll continue on the current metoprolol at 25 mg twice a day.  2. Acute on chronic renal insufficiency: This is expected given persistent low blood pressure likely for several weeks. Creatinine peaked at 2.3 last Friday. Plan for serial basic metabolic panel for this Friday and the next Friday to continue to trend renal function.  3. Syncope: Pending upcoming 30 day event monitor: Event monitor will also let his known as she has any recurrent PAF, most likely cause for syncope was significant hypertension  4. Palpitation: Clearly has recurrent atrial fibrillation, she has history of remote paroxysmal atrial fibrillation that has not recurred in several years. Pending upcoming Thursday event monitor.  5. History of PAF: I was unable to locate any previous EKG indicate paroxysmal atrial fibrillation, apparently she had remote PAF however no recurrence in the last several years. She is currently on aspirin. I don't think she is a very good candidate for systemic anticoagulation even if heart monitor shows recurrent atrial fibrillation. She is high at higher risk for fall  6. Carotid artery disease: Recent carotid ultrasound obtained on 11/14/2016 shows no significant change compared to the previous ultrasound. Mild disease bilaterally  7. Hypertension: Was recently more hypotensive due to  medications. Her blood pressure is coming up slowly.  8. Hyperlipidemia: Previously elevated CPK on pravastatin. GI symptom on Ezetimibe and myalgia on fenofibrate. She has nausea on lovastatin. She is no longer on any statin medications.  9. Hypothyroidism: On Synthroid    Medication Adjustments/Labs and Tests Ordered: Current medicines are reviewed at length with the patient today.  Concerns regarding medicines are outlined above.  Medication changes, Labs and Tests ordered today are listed in the Patient Instructions below. Patient Instructions  Medication Instructions:   No changes today.  Labwork:   BMET this Friday and the following Friday   Testing/Procedures:   Follow-Up:  With Dr. Sallyanne Kuster in 2 months (after results of heart monitor)   If you need a refill on your cardiac medications before your next appointment, please call your pharmacy.      Hilbert Corrigan, [place]  12/03/2016 12:27 PM    [facility] [address], [place], [place]  [address] Phone: [phone]; Fax: [phone]

## 2016-12-02 NOTE — Telephone Encounter (Signed)
Tried to call the patient, she did not pick up. Continue on current medication for now. Calcium level has not really changed much compare to 1 year ago. Will decide on repeat blood work

## 2016-12-02 NOTE — Patient Instructions (Addendum)
Medication Instructions:   No changes today.  Labwork:   BMET this Friday and the following Friday   Testing/Procedures:   Follow-Up:  With Dr. Sallyanne Kuster in 2 months (after results of heart monitor)   If you need a refill on your cardiac medications before your next appointment, please call your pharmacy.

## 2016-12-03 ENCOUNTER — Encounter: Payer: Self-pay | Admitting: Physician Assistant

## 2016-12-04 NOTE — Telephone Encounter (Signed)
Returned call to [place], daughter, advised calcium score OK as compared to last check, Isaac Laud to decide if further labwork needed, o/w f/u as scheduled. She was fine w this plan, voiced understanding of recommendations & thanks for return call.

## 2016-12-06 DIAGNOSIS — Z79899 Other long term (current) drug therapy: Secondary | ICD-10-CM | POA: Diagnosis not present

## 2016-12-09 ENCOUNTER — Telehealth: Payer: Self-pay | Admitting: Cardiovascular Disease

## 2016-12-09 NOTE — Telephone Encounter (Signed)
Called the daughter back per the DPR. The lab results and instructions were explained per Deborah Shields's notes:  Acute on chronic renal insufficiency: This is expected given persistent low blood pressure likely for several weeks. Creatinine peaked at 2.3 last Friday. Plan for serial basic metabolic panel for this Friday and the next Friday to continue to trend renal function.  The daughter verbalized her understanding. She stated that the patient has had bilateral lower extremity swelling and would like for Almyra Deforest, PA to know. Will route to him for his knowledge.

## 2016-12-09 NOTE — Telephone Encounter (Signed)
Pt's dtr [name] Fields calling regarding labs Deborah Shields called to pt, she didn't understand any of the results, could [name] get a call with those results? pls call  [phone]

## 2016-12-09 NOTE — Telephone Encounter (Signed)
Recommend leg elevation for swelling. Let us know if increasing shortness of breath, orthopnea or paroxysmal nocturnal dyspnea. Otherwise would prefer not to use diuretic which can potentially dehydrate her further and worsen renal function. We can consider diuretic later once her renal function improve and if her swelling is getting worse.

## 2016-12-10 ENCOUNTER — Ambulatory Visit (INDEPENDENT_AMBULATORY_CARE_PROVIDER_SITE_OTHER): Payer: Medicare Other | Admitting: Cardiology

## 2016-12-10 ENCOUNTER — Encounter: Payer: Self-pay | Admitting: Cardiology

## 2016-12-10 VITALS — BP 116/68 | HR 84 | Ht 65.0 in | Wt 113.0 lb

## 2016-12-10 DIAGNOSIS — I6523 Occlusion and stenosis of bilateral carotid arteries: Secondary | ICD-10-CM

## 2016-12-10 DIAGNOSIS — I480 Paroxysmal atrial fibrillation: Secondary | ICD-10-CM

## 2016-12-10 DIAGNOSIS — N183 Chronic kidney disease, stage 3 unspecified: Secondary | ICD-10-CM

## 2016-12-10 DIAGNOSIS — L821 Other seborrheic keratosis: Secondary | ICD-10-CM | POA: Diagnosis not present

## 2016-12-10 DIAGNOSIS — R600 Localized edema: Secondary | ICD-10-CM | POA: Diagnosis not present

## 2016-12-10 DIAGNOSIS — D1801 Hemangioma of skin and subcutaneous tissue: Secondary | ICD-10-CM | POA: Diagnosis not present

## 2016-12-10 DIAGNOSIS — I10 Essential (primary) hypertension: Secondary | ICD-10-CM

## 2016-12-10 DIAGNOSIS — Z85828 Personal history of other malignant neoplasm of skin: Secondary | ICD-10-CM | POA: Diagnosis not present

## 2016-12-10 DIAGNOSIS — D692 Other nonthrombocytopenic purpura: Secondary | ICD-10-CM | POA: Diagnosis not present

## 2016-12-10 MED ORDER — HYDROCHLOROTHIAZIDE 12.5 MG PO CAPS: 12.5000 mg | ORAL_CAPSULE | ORAL | 3 refills | Status: DC

## 2016-12-10 NOTE — Assessment & Plan Note (Signed)
Postoperative shoulder surgery in 2012- no documented recurrance

## 2016-12-10 NOTE — Telephone Encounter (Signed)
Spoke with the daughter about elevating the legs. She stated that they are so swollen that they look like they could burst and she would like for her to be seen. There are not any appointments available. Will route to the provider for recommendations.

## 2016-12-10 NOTE — Assessment & Plan Note (Signed)
Pt seen in the office today with complaint of edema in both feet and ankles

## 2016-12-10 NOTE — Assessment & Plan Note (Signed)
With recent orthostatic hypotension-stable today

## 2016-12-10 NOTE — Telephone Encounter (Signed)
Patient has been scheduled for an appointment today with Kerin Ransom, PA. The daughter wants to have her mom's lower extremity edema assessed. She stated that she has been elevating her legs but that was not helping and she is concerned.

## 2016-12-10 NOTE — Assessment & Plan Note (Signed)
F/U SCr by Dr Wilson Singer 12/06/16- SCr down to 1.9 from 2.3 on 11/29/16

## 2016-12-10 NOTE — Progress Notes (Signed)
12/10/2016 Deborah Shields   14-Feb-1941  [id number]  Primary Physician Anda Kraft, MD Primary Cardiologist: Dr Sallyanne Kuster  HPI:  76 y/o female, lives alone with a daughter nearby. She has a history of PAF in 2012 after shoulder surgery. This was apparently a one time event and has not recurred. Myoview and echo in Sept 2015 were normal. Other problems include mild carotid disease, HTN, CRI-3, and dyslipidemia with statin intolerance.   She is in the office today seeing me as an add on. She was seen by Octaviano Batty 11/29/16 with a history of syncope and was found to be orthostatic. Her medications were adjusted and she was seen back in follow up 12/02/16 and she was doing better. Her daughter called today asking that the pt be seen for LE edema. The pt says she has had LE edema in the past but it has been much worse recently. She is somewhat of a difficult historian but it appears this edema is a new problem. Sh denies orthopnea or unusual SOB. She has had no further syncopal spells and in retrospect she says she's not sure she ever had one, she thinks she may have tripped.    Current Outpatient Prescriptions  Medication Sig Dispense Refill  . ALPRAZolam (XANAX) 0.5 MG tablet Rarely takes    . aspirin 81 MG tablet Take 81 mg by mouth daily.      Marland Kitchen b complex vitamins tablet Take 1 tablet by mouth daily.      . Calcium Carbonate (CALTRATE 600 PO) Take 1 tablet by mouth 2 (two) times daily.    . cholecalciferol (VITAMIN D) 1000 units tablet Take 1,000 Units by mouth daily.    . cyclobenzaprine (FLEXERIL) 10 MG tablet Take 10 mg by mouth as needed for muscle spasms.    . diclofenac sodium (VOLTAREN) 1 % GEL Apply topically as needed.    Marland Kitchen levothyroxine (SYNTHROID) 88 MCG tablet Take 88 mcg by mouth daily. Taking the brand "synthroid"    . LORazepam (ATIVAN) 0.5 MG tablet Take 1 tablet (0.5 mg total) by mouth 3 (three) times daily as needed. 90 tablet 1  . meclizine (ANTIVERT) 25 MG tablet Take 1  tablet (25 mg total) by mouth daily. 1/2 every 8 hrs prn for imbalance (Patient taking differently: Take 12.5 mg by mouth 3 (three) times daily as needed. 1/2 every 8 hrs prn for imbalance) 30 tablet 2  . metoprolol tartrate (LOPRESSOR) 25 MG tablet Take 1 tablet (25 mg total) by mouth 2 (two) times daily. 180 tablet 1  . Multiple Vitamins-Minerals (CENTRUM SILVER PO) Take by mouth.      . predniSONE (DELTASONE) 10 MG tablet TK 1 T PO D  1  . Probiotic Product (PROBIOTIC PO) Take 1 tablet by mouth daily.    . traMADol (ULTRAM) 50 MG tablet 1/2-1 by mouth once daily as needed. 90 tablet 1  . vitamin E 100 UNIT capsule Take 100 Units by mouth daily.     Derrill Memo ON 12/11/2016] hydrochlorothiazide (MICROZIDE) 12.5 MG capsule Take 1 capsule (12.5 mg total) by mouth every Monday, Wednesday, and Friday. 30 capsule 3   Current Facility-Administered Medications  Medication Dose Route Frequency Provider Last Rate Last Dose  . 0.9 %  sodium chloride infusion  500 mL Intravenous Continuous Milus Banister, MD        Allergies  Allergen Reactions  . Aspirin Nausea Only    REACTION: GI upset  ( pt can take 81  mg but NOT   325 mg ASA )  . Lovastatin Nausea Only    REACTION: nausea  . Benazepril Hcl   . Paroxetine   . Bupropion Hcl Other (See Comments)    REACTION: tinnitis  . Ezetimibe Nausea And Vomiting    REACTION: GI symptoms  . Fenofibrate Other (See Comments)    Myalgia   . Pravastatin Sodium Other (See Comments)    REACTION: elevated CPK - Muscle's in bilateral Leg    Past Medical History:  Diagnosis Date  . Anxiety   . Aortic insufficiency    mild due to degenerative changes  . Arthritis   . Basal cell carcinoma 2007   [facility] Left leg  . Carotid artery occlusion   . Conjunctivitis due to adenovirus, both eyes   . Degenerative joint disease   . Diverticulosis of colon   . Heart murmur   . Hyperlipidemia    NMR 07/2009: LDL 200 (2260/1233)TG 99, HDL 65. LDL goal =<120,  ideally <100. father MI @ 22  . Hypertension   . Hypothyroidism    Dr Wilson Singer  . Microscopic hematuria   . Peripheral neuropathy    compressive in UE bilaterally; Dr Daylene Katayama  . Peripheral vascular disease (HCC)    ICA bilat, Dr.Charles fields, [facility]  . Pneumonia   . Rectocele   . [place] spotted fever     Social History   Social History  . Marital status: Widowed    Spouse name: N/A  . Number of children: 1  . Years of education: N/A   Occupational History  . retired    Social History Main Topics  . Smoking status: Never Smoker  . Smokeless tobacco: Never Used  . Alcohol use No  . Drug use: No  . Sexual activity: Not on file   Other Topics Concern  . Not on file   Social History Narrative   Low cholesterol diet   Regular exercise- yes      Family History  Problem Relation Age of Onset  . Heart attack Father 33  . Heart disease Father        Before age 32  . Colon cancer Mother 18  . Stroke Mother 75  . Kidney disease Mother        ? renal calculi; S/P resecton of kidney  . Cancer - Colon Mother 2  . Ovarian cancer Sister   . Other Sister        Valve replacement (aortic & mitral ) in 2 sisters  . Heart disease Sister        Before age 51  . Heart disease Brother        aortic & mitral valve replacement in 2 bro; both had CBAG  . Diabetes Neg Hx      Review of Systems: General: negative for chills, fever, night sweats or weight changes.  Cardiovascular: negative for chest pain, dyspnea on exertion, edema, orthopnea, palpitations, paroxysmal nocturnal dyspnea or shortness of breath Dermatological: negative for rash Respiratory: negative for cough or wheezing Urologic: negative for hematuria Abdominal: negative for nausea, vomiting, diarrhea, bright red blood per rectum, melena, or hematemesis Neurologic: negative for visual changes, syncope, or dizziness H/O [facility] June 2017 H/O radiculopathy Lt leg- Dr Sherwood Gambler follows, for MRI this week.  All  other systems reviewed and are otherwise negative except as noted above.    Blood pressure 116/68, pulse 84, height 5\' 5"  (1.651 m), weight 113 lb (51.3 kg).  General  appearance: alert, cooperative and no distress Neck: no carotid bruit and no JVD Lungs: clear to auscultation bilaterally Heart: regular rate and rhythm Extremities: 1-2+ LLE edema, 1+ RLE edema Skin: pale, cool, dry. Areas of ecchymosis on her arms Neurologic: Grossly normal   ASSESSMENT AND PLAN:   Edema of both feet Pt seen in the office today with complaint of edema in both feet and ankles  Essential hypertension With recent orthostatic hypotension-stable today  Carotid artery stenosis, asymptomatic Moderate ICA disease followed by Dr Valinda Hoar  PAF (paroxysmal atrial fibrillation) ([place]) Postoperative shoulder surgery in 2012- no documented recurrance  Chronic renal insufficiency, stage III (moderate) F/U SCr by Dr Wilson Singer 12/06/16- SCr down to 1.9 from 2.3 on 11/29/16   PLAN  I suggested we try low dose HCTZ MWF. I'll get an echo and see her in follow up. I suggested she elevate her legs as much as possible and avoid salt.  Kerin Ransom PA-C 12/10/2016 2:26 PM

## 2016-12-10 NOTE — Assessment & Plan Note (Signed)
Moderate ICA disease followed by Dr Valinda Hoar

## 2016-12-10 NOTE — Patient Instructions (Signed)
Medication Instructions:   START HCTZ (Hydrochlorothiazide). This is a 12.5 milligram dose. Take the 1st dose of this medication today. Starting tomorrow, take this medication daily on Mondays, Wednesdays, and Fridays only.  Labwork:   None  Testing/Procedures:  Your physician has requested that you have an echocardiogram. Echocardiography is a painless test that uses sound waves to create images of your heart. It provides your doctor with information about the size and shape of your heart and how well your heart's chambers and valves are working. This procedure takes approximately one hour. There are no restrictions for this procedure.   Follow-Up:  With Kerin Ransom PA following echocardiogram.   If you need a refill on your cardiac medications before your next appointment, please call your pharmacy.

## 2016-12-12 ENCOUNTER — Ambulatory Visit (INDEPENDENT_AMBULATORY_CARE_PROVIDER_SITE_OTHER): Payer: Medicare Other

## 2016-12-12 DIAGNOSIS — M47816 Spondylosis without myelopathy or radiculopathy, lumbar region: Secondary | ICD-10-CM | POA: Diagnosis not present

## 2016-12-12 DIAGNOSIS — R002 Palpitations: Secondary | ICD-10-CM

## 2016-12-12 DIAGNOSIS — I10 Essential (primary) hypertension: Secondary | ICD-10-CM | POA: Diagnosis not present

## 2016-12-12 DIAGNOSIS — M545 Low back pain: Secondary | ICD-10-CM | POA: Diagnosis not present

## 2016-12-13 DIAGNOSIS — Z79899 Other long term (current) drug therapy: Secondary | ICD-10-CM | POA: Diagnosis not present

## 2016-12-17 DIAGNOSIS — M25572 Pain in left ankle and joints of left foot: Secondary | ICD-10-CM | POA: Diagnosis not present

## 2016-12-17 DIAGNOSIS — R609 Edema, unspecified: Secondary | ICD-10-CM | POA: Diagnosis not present

## 2016-12-17 DIAGNOSIS — M25571 Pain in right ankle and joints of right foot: Secondary | ICD-10-CM | POA: Diagnosis not present

## 2016-12-17 DIAGNOSIS — M7989 Other specified soft tissue disorders: Secondary | ICD-10-CM | POA: Diagnosis not present

## 2016-12-23 ENCOUNTER — Ambulatory Visit (HOSPITAL_COMMUNITY): Payer: Medicare Other | Attending: Internal Medicine

## 2016-12-23 ENCOUNTER — Other Ambulatory Visit: Payer: Self-pay

## 2016-12-23 DIAGNOSIS — I480 Paroxysmal atrial fibrillation: Secondary | ICD-10-CM | POA: Insufficient documentation

## 2016-12-23 DIAGNOSIS — I083 Combined rheumatic disorders of mitral, aortic and tricuspid valves: Secondary | ICD-10-CM | POA: Diagnosis not present

## 2016-12-23 DIAGNOSIS — E785 Hyperlipidemia, unspecified: Secondary | ICD-10-CM | POA: Insufficient documentation

## 2016-12-23 DIAGNOSIS — I10 Essential (primary) hypertension: Secondary | ICD-10-CM | POA: Insufficient documentation

## 2016-12-23 DIAGNOSIS — R600 Localized edema: Secondary | ICD-10-CM | POA: Insufficient documentation

## 2016-12-24 ENCOUNTER — Telehealth: Payer: Self-pay | Admitting: Cardiology

## 2016-12-24 ENCOUNTER — Telehealth: Payer: Self-pay | Admitting: Internal Medicine

## 2016-12-24 DIAGNOSIS — Z789 Other specified health status: Secondary | ICD-10-CM | POA: Diagnosis not present

## 2016-12-24 DIAGNOSIS — M19049 Primary osteoarthritis, unspecified hand: Secondary | ICD-10-CM | POA: Diagnosis not present

## 2016-12-24 DIAGNOSIS — M549 Dorsalgia, unspecified: Secondary | ICD-10-CM | POA: Diagnosis not present

## 2016-12-24 DIAGNOSIS — M2550 Pain in unspecified joint: Secondary | ICD-10-CM | POA: Diagnosis not present

## 2016-12-24 NOTE — Telephone Encounter (Signed)
Returning your call,concerning her Echo results.

## 2016-12-24 NOTE — Telephone Encounter (Signed)
Returned call. Discussed w [name], daughter/DPR. Notes pt doing quite well & feeling improved since June. Concerning feet swelling, she was diagnosed w gout this week by PCP after some testing. Will plan to f/u w Dr. Sallyanne Kuster as scheduled in Sept. Aware to call sooner if new concerns.

## 2016-12-24 NOTE — Telephone Encounter (Signed)
Wrong provider

## 2017-01-06 ENCOUNTER — Other Ambulatory Visit: Payer: Self-pay | Admitting: Neurosurgery

## 2017-01-06 DIAGNOSIS — M5416 Radiculopathy, lumbar region: Secondary | ICD-10-CM | POA: Diagnosis not present

## 2017-01-06 DIAGNOSIS — R29898 Other symptoms and signs involving the musculoskeletal system: Secondary | ICD-10-CM | POA: Diagnosis not present

## 2017-01-06 DIAGNOSIS — M5126 Other intervertebral disc displacement, lumbar region: Secondary | ICD-10-CM | POA: Diagnosis not present

## 2017-01-06 DIAGNOSIS — M4726 Other spondylosis with radiculopathy, lumbar region: Secondary | ICD-10-CM | POA: Diagnosis not present

## 2017-01-06 DIAGNOSIS — Z681 Body mass index (BMI) 19 or less, adult: Secondary | ICD-10-CM | POA: Diagnosis not present

## 2017-01-06 DIAGNOSIS — M21372 Foot drop, left foot: Secondary | ICD-10-CM | POA: Diagnosis not present

## 2017-01-06 DIAGNOSIS — M5136 Other intervertebral disc degeneration, lumbar region: Secondary | ICD-10-CM | POA: Diagnosis not present

## 2017-01-09 DIAGNOSIS — H26492 Other secondary cataract, left eye: Secondary | ICD-10-CM | POA: Diagnosis not present

## 2017-01-20 NOTE — Pre-Procedure Instructions (Signed)
Deborah Shields  01/20/2017      [facility] [address], [place] [address] Phone: [phone] Fax: [phone]  [facility] [place] [address] [place] [address] Phone: [phone] Fax: [phone]    Your procedure is scheduled on January 27, 2017.  Report to [facility] Admitting at 1045 AM.  Call this number if you have problems the morning of surgery:  [phone]   Remember:  Do not eat food or drink liquids after midnight.  Take these medicines the morning of surgery with A SIP OF WATER tramadol (ultram)-if needed for pain, prednisone (deltasone), metoprolol (lopressor), meclizine (antivert)-if needed, lorazepam (ativan)-if needed, levothyroxine (synthroid), cyclobenzaprine (flexeril)-if needed  7 days prior to surgery STOP taking any (diclofenac) voltaren gel,  Aspirin, Aleve, Naproxen, Ibuprofen, Motrin, Advil, Goody's, BC's, all herbal medications, fish oil, and all vitamins  Do not wear jewelry, make-up or nail polish.  Do not wear lotions, powders, or perfumes, or deoderant.  Do not shave 48 hours prior to surgery.  Men may shave face and neck.  Do not bring valuables to the hospital.  [facility] is not responsible for any belongings or valuables.  Contacts, dentures or bridgework may not be worn into surgery.  Leave your suitcase in the car.  After surgery it may be brought to your room.  For patients admitted to the hospital, discharge time will be determined by your treatment team.  Patients discharged the day of surgery will not be allowed to drive home.   Special instructions:   [place]- Preparing For Surgery  Before surgery, you can play an important role. Because skin is not sterile, your skin needs to be as free of germs as possible.  You can reduce the number of germs on your skin by washing with CHG (chlorahexidine gluconate) Soap before surgery.  CHG is an antiseptic cleaner which kills germs and bonds with the skin to continue killing germs even after washing.  Please do not use if you have an allergy to CHG or antibacterial soaps. If your skin becomes reddened/irritated stop using the CHG.  Do not shave (including legs and underarms) for at least 48 hours prior to first CHG shower. It is OK to shave your face.  Please follow these instructions carefully.   1. Shower the NIGHT BEFORE SURGERY and the MORNING OF SURGERY with CHG.   2. If you chose to wash your hair, wash your hair first as usual with your normal shampoo.  3. After you shampoo, rinse your hair and body thoroughly to remove the shampoo.  4. Use CHG as you would any other liquid soap. You can apply CHG directly to the skin and wash gently with a scrungie or a clean washcloth.   5. Apply the CHG Soap to your body ONLY FROM THE NECK DOWN.  Do not use on open wounds or open sores. Avoid contact with your eyes, ears, mouth and genitals (private parts). Wash genitals (private parts) with your normal soap.  6. Wash thoroughly, paying special attention to the area where your surgery will be performed.  7. Thoroughly rinse your body with warm water from the neck down.  8. DO NOT shower/wash with your normal soap after using and rinsing off the CHG Soap.  9. [name]  yourself dry with a CLEAN TOWEL.   10. Wear CLEAN PAJAMAS   11. Place CLEAN SHEETS on your bed the night of your first shower and DO NOT SLEEP WITH PETS.    Day of Surgery: Do not apply any deodorants/lotions. Please wear clean clothes to the hospital/surgery center.     Please read over the following fact sheets that you were given. Pain Booklet, Coughing and Deep Breathing, MRSA Information and Surgical Site Infection Prevention

## 2017-01-21 ENCOUNTER — Encounter (HOSPITAL_COMMUNITY): Payer: Self-pay

## 2017-01-21 ENCOUNTER — Encounter (HOSPITAL_COMMUNITY)
Admission: RE | Admit: 2017-01-21 | Discharge: 2017-01-21 | Disposition: A | Discharge status: Home / Self Care | Payer: Medicare Other | Source: Ambulatory Visit | Attending: Neurosurgery | Admitting: Neurosurgery

## 2017-01-21 DIAGNOSIS — Z01812 Encounter for preprocedural laboratory examination: Secondary | ICD-10-CM | POA: Diagnosis not present

## 2017-01-21 DIAGNOSIS — M5126 Other intervertebral disc displacement, lumbar region: Secondary | ICD-10-CM | POA: Diagnosis not present

## 2017-01-21 HISTORY — DX: Other specified postprocedural states: R11.2

## 2017-01-21 HISTORY — DX: Nausea with vomiting, unspecified: Z98.890

## 2017-01-21 LAB — CBC
HCT: 33.8 % — ABNORMAL LOW (ref 36.0–46.0)
Hemoglobin: 11 g/dL — ABNORMAL LOW (ref 12.0–15.0)
MCH: 30.1 pg (ref 26.0–34.0)
MCHC: 32.5 g/dL (ref 30.0–36.0)
MCV: 92.6 fL (ref 78.0–100.0)
Platelets: 288 10*3/uL (ref 150–400)
RBC: 3.65 MIL/uL — ABNORMAL LOW (ref 3.87–5.11)
RDW: 13.5 % (ref 11.5–15.5)
WBC: 7.6 10*3/uL (ref 4.0–10.5)

## 2017-01-21 LAB — BASIC METABOLIC PANEL
Anion gap: 9 (ref 5–15)
BUN: 24 mg/dL — ABNORMAL HIGH (ref 6–20)
CO2: 29 mmol/L (ref 22–32)
Calcium: 11.1 mg/dL — ABNORMAL HIGH (ref 8.9–10.3)
Chloride: 101 mmol/L (ref 101–111)
Creatinine, Ser: 2.52 mg/dL — ABNORMAL HIGH (ref 0.44–1.00)
GFR calc Af Amer: 20 mL/min — ABNORMAL LOW (ref >60)
GFR calc non Af Amer: 17 mL/min — ABNORMAL LOW (ref >60)
Glucose, Bld: 80 mg/dL (ref 65–99)
Potassium: 4 mmol/L (ref 3.5–5.1)
Sodium: 139 mmol/L (ref 135–145)

## 2017-01-21 LAB — SURGICAL PCR SCREEN
MRSA, PCR: POSITIVE — AB
Staphylococcus aureus: POSITIVE — AB

## 2017-01-21 NOTE — Progress Notes (Signed)
PATIENT STATED HER LAST DOSE OF ASPIRIN WAS 01/20/17.

## 2017-01-22 ENCOUNTER — Encounter (HOSPITAL_COMMUNITY): Payer: Self-pay | Admitting: Emergency Medicine

## 2017-01-22 NOTE — Progress Notes (Addendum)
Anesthesia Chart Review:  Pt is a 76 year old female scheduled for L4-5 laminectomy and microdiscectomy on 01/27/2017 with Ovidio Kin, MD  - Primary care at [facility] - Vascular surgeon is Ruta Hinds, MD - Cardiologist is Sanda Klein, MD. Last office visit 12/10/16 with Kerin Ransom, MD  PMH includes:  HTN, carotid artery disease, aortic insufficiency, hyperlipidemia, hypothyroidism, post-op N/V. Never smoker. BMI 18.  Medications include: ASA 81 mg, levothyroxine, metoprolol. HCTZ is listed but pt reports she is not taking it. Last dose ASA 01/20/17.   BP 110/70   Pulse 69   Temp 36.7 C   Resp 18   Ht 5\' 5"  (1.651 m)   Wt 110 lb (49.9 kg)   SpO2 100%   BMI 18.30 kg/m    Preoperative labs reviewed.   - Cr 2.52, BUN 24.  Per PCP records scanned into media tab, baseline Cr appears to be ~1.6 -  Calcium 11.1.   - I reached out to [facility] about these lab results.  Pt had labs rechecked 01/23/17 there and reportedly they are still abnormal.  Dr. Maudie Mercury in that office would like surgery postponed for now.  He has asked pt to increase fluid intake and will see pt and recheck labs next week.  I left a voicemail for [facility] in Dr. Donnella Bi office.   EKG 11/29/16: NSR. Biatrial enlargement.  Cardiac event monitor 01/14/17:  - Normal event monitor. No evidence of atrial fibrillation  Echo 12/23/16:  - Left ventricle: The cavity size was normal. Wall thickness was normal. Systolic function was normal. The estimated ejection fraction was in the range of 60% to 65%. Wall motion was normal; there were no regional wall motion abnormalities. Doppler parameters are consistent with abnormal left ventricular relaxation (grade 1 diastolic dysfunction). The E/e&' ratio is between 8-15, suggesting indeterminate LV filling pressure. - Aortic valve: Trileaflet. Sclerosis without stenosis. Transvalvular velocity was minimally increased. There was trivial  regurgitation. Mean gradient (S): 8 mm Hg. Peak gradient (S): 17 mm Hg. - Mitral valve: Mildly thickened leaflets . There was trivial regurgitation. - Left atrium: The atrium was normal in size. - Tricuspid valve: There was mild regurgitation. - Pulmonary arteries: PA peak pressure: 27 mm Hg (S). - Inferior vena cava: The vessel was normal in size. The respirophasic diameter changes were in the normal range (>= 50%), consistent with normal central venous pressure. - Impressions:  Compared to a prior study in 2015, the LVEF is stable at 60-65%.  Carotid duplex 11/14/16:  - <40% B ICA stenosis. No change since 10/26/15.  - multiphasic subclavian arteries.  Distal ICA are tortuous  Nuclear stress test 03/09/14:  - Normal stress nuclear study. LV Wall Motion:  NL LV Function, EF 67%; NL Wall Motion  Willeen Cass, FNP-BC [facility] Short Stay [facility]/Anesthesiology Phone: [phone] 01/24/2017 12:06 PM

## 2017-01-27 ENCOUNTER — Encounter (HOSPITAL_COMMUNITY): Admission: RE | Payer: Self-pay | Source: Ambulatory Visit

## 2017-01-27 ENCOUNTER — Ambulatory Visit (HOSPITAL_COMMUNITY): Admission: RE | Admit: 2017-01-27 | Payer: Medicare Other | Source: Ambulatory Visit | Admitting: Neurosurgery

## 2017-01-27 SURGERY — LUMBAR LAMINECTOMY/DECOMPRESSION MICRODISCECTOMY 1 LEVEL
Anesthesia: General | Laterality: Left

## 2017-01-30 DIAGNOSIS — I10 Essential (primary) hypertension: Secondary | ICD-10-CM | POA: Diagnosis not present

## 2017-01-30 DIAGNOSIS — G8929 Other chronic pain: Secondary | ICD-10-CM | POA: Diagnosis not present

## 2017-01-30 DIAGNOSIS — N183 Chronic kidney disease, stage 3 (moderate): Secondary | ICD-10-CM | POA: Diagnosis not present

## 2017-01-30 DIAGNOSIS — M5442 Lumbago with sciatica, left side: Secondary | ICD-10-CM | POA: Diagnosis not present

## 2017-01-31 ENCOUNTER — Other Ambulatory Visit: Payer: Self-pay | Admitting: Internal Medicine

## 2017-01-31 DIAGNOSIS — N183 Chronic kidney disease, stage 3 unspecified: Secondary | ICD-10-CM

## 2017-02-05 ENCOUNTER — Other Ambulatory Visit: Payer: Medicare Other

## 2017-02-06 ENCOUNTER — Ambulatory Visit
Admission: RE | Admit: 2017-02-06 | Discharge: 2017-02-06 | Disposition: A | Discharge status: Home / Self Care | Payer: Medicare Other | Source: Ambulatory Visit | Attending: Internal Medicine | Admitting: Internal Medicine

## 2017-02-06 DIAGNOSIS — N183 Chronic kidney disease, stage 3 unspecified: Secondary | ICD-10-CM

## 2017-02-06 DIAGNOSIS — N189 Chronic kidney disease, unspecified: Secondary | ICD-10-CM | POA: Diagnosis not present

## 2017-02-11 DIAGNOSIS — M79641 Pain in right hand: Secondary | ICD-10-CM | POA: Diagnosis not present

## 2017-02-11 DIAGNOSIS — M79671 Pain in right foot: Secondary | ICD-10-CM | POA: Diagnosis not present

## 2017-02-11 DIAGNOSIS — M109 Gout, unspecified: Secondary | ICD-10-CM | POA: Diagnosis not present

## 2017-02-11 DIAGNOSIS — M79642 Pain in left hand: Secondary | ICD-10-CM | POA: Diagnosis not present

## 2017-02-11 DIAGNOSIS — M549 Dorsalgia, unspecified: Secondary | ICD-10-CM | POA: Diagnosis not present

## 2017-02-11 DIAGNOSIS — R768 Other specified abnormal immunological findings in serum: Secondary | ICD-10-CM | POA: Diagnosis not present

## 2017-02-11 DIAGNOSIS — M2550 Pain in unspecified joint: Secondary | ICD-10-CM | POA: Diagnosis not present

## 2017-02-11 DIAGNOSIS — M7989 Other specified soft tissue disorders: Secondary | ICD-10-CM | POA: Diagnosis not present

## 2017-02-11 DIAGNOSIS — M1990 Unspecified osteoarthritis, unspecified site: Secondary | ICD-10-CM | POA: Diagnosis not present

## 2017-02-11 DIAGNOSIS — M19049 Primary osteoarthritis, unspecified hand: Secondary | ICD-10-CM | POA: Diagnosis not present

## 2017-02-19 DIAGNOSIS — N183 Chronic kidney disease, stage 3 (moderate): Secondary | ICD-10-CM | POA: Diagnosis not present

## 2017-02-19 DIAGNOSIS — N289 Disorder of kidney and ureter, unspecified: Secondary | ICD-10-CM | POA: Diagnosis not present

## 2017-02-27 ENCOUNTER — Other Ambulatory Visit: Payer: Self-pay | Admitting: Neurosurgery

## 2017-02-27 DIAGNOSIS — Z23 Encounter for immunization: Secondary | ICD-10-CM | POA: Diagnosis not present

## 2017-02-28 ENCOUNTER — Ambulatory Visit: Payer: Medicare Other | Admitting: Cardiovascular Disease

## 2017-03-03 DIAGNOSIS — H04221 Epiphora due to insufficient drainage, right lacrimal gland: Secondary | ICD-10-CM | POA: Diagnosis not present

## 2017-03-03 DIAGNOSIS — B0052 Herpesviral keratitis: Secondary | ICD-10-CM | POA: Diagnosis not present

## 2017-03-11 DIAGNOSIS — B0052 Herpesviral keratitis: Secondary | ICD-10-CM | POA: Diagnosis not present

## 2017-03-11 DIAGNOSIS — H04221 Epiphora due to insufficient drainage, right lacrimal gland: Secondary | ICD-10-CM | POA: Diagnosis not present

## 2017-03-17 NOTE — Pre-Procedure Instructions (Signed)
AUBRII SHARPLESS  03/17/2017      [facility] - [address], [place] [address] Phone: [phone] Fax: [phone]  [facility] [place] [place] AT [address] to [address] [place] [address] Phone: [phone] Fax: [phone]    Your procedure is scheduled on Thursday, March 20, 2017  Report to [facility] Admitting Entrance "A" at 8:00 A.M.   Call this number if you have problems the morning of surgery:  [phone]   Remember:  Do not eat food or drink liquids after midnight.  Take these medicines the morning of surgery with A SIP OF WATER: Levothyroxine (SYNTHROID) and Metoprolol tartrate (LOPRESSOR).  Follow your doctor's instruction for taking ASPIRIN.  As of today, stop taking all Aspirin products, Vitamins, Fish oils, and Herbal medications. Also stop all NSAIDS i.e. Advil, Motrin, Aleve, Anaprox, Naproxen, BC and Goody Powders.   Do not wear jewelry, make-up or nail polish.  Do not wear lotions, powders, or perfumes, or deodorant.  Do not shave 48 hours prior to surgery.    Do not bring valuables to the hospital.  [facility] is not responsible for any belongings or valuables.  Contacts, dentures or bridgework may not be worn into surgery.  Leave your suitcase in the car.  After surgery it may be brought to your room.  For patients admitted to the hospital, discharge time will be determined by your treatment team.  Patients discharged the day of surgery will not be allowed to drive home.   Special instructions:  [place]- Preparing For Surgery  Before surgery, you can play an important role. Because skin is not sterile, your skin needs to be as free of germs as possible. You can reduce the number of germs on your skin by washing with CHG (chlorahexidine gluconate)  Soap before surgery.  CHG is an antiseptic cleaner which kills germs and bonds with the skin to continue killing germs even after washing.  Please do not use if you have an allergy to CHG or antibacterial soaps. If your skin becomes reddened/irritated stop using the CHG.  Do not shave (including legs and underarms) for at least 48 hours prior to first CHG shower. It is OK to shave your face.  Please follow these instructions carefully.   1. Shower the NIGHT BEFORE SURGERY and the MORNING OF SURGERY with CHG.   2. If you chose to wash your hair, wash your hair first as usual with your normal shampoo.  3. After you shampoo, rinse your hair and body thoroughly to remove the shampoo.  4. Use CHG as you would any other liquid soap. You can apply CHG directly to the skin and wash gently with a scrungie or a clean washcloth.   5. Apply the CHG Soap to your body ONLY FROM THE NECK DOWN.  Do not use on open wounds or open sores. Avoid contact with your eyes, ears, mouth and genitals (private parts). Wash genitals (private parts) with your normal soap.  6. Wash thoroughly, paying special attention to the area where your surgery will be performed.  7. Thoroughly rinse your body with warm water from the neck down.  8. DO NOT shower/wash with your normal soap after using and rinsing off the CHG Soap.  9. Pat yourself dry with a CLEAN TOWEL.  10. Wear CLEAN PAJAMAS   11. Place CLEAN SHEETS on your bed the night of your first shower and DO NOT SLEEP WITH PETS.  Day of Surgery: Do not apply any deodorants/lotions. Please wear clean clothes to the hospital/surgery center.    Please read over the following fact sheets that you were given. Pain Booklet, Coughing and Deep Breathing, MRSA Information and Surgical Site Infection Prevention

## 2017-03-18 ENCOUNTER — Encounter (HOSPITAL_COMMUNITY): Payer: Self-pay

## 2017-03-18 ENCOUNTER — Encounter (HOSPITAL_COMMUNITY)
Admission: RE | Admit: 2017-03-18 | Discharge: 2017-03-18 | Disposition: A | Discharge status: Home / Self Care | Payer: Medicare Other | Source: Ambulatory Visit | Attending: Neurosurgery | Admitting: Neurosurgery

## 2017-03-18 DIAGNOSIS — Z9842 Cataract extraction status, left eye: Secondary | ICD-10-CM | POA: Diagnosis not present

## 2017-03-18 DIAGNOSIS — Z01818 Encounter for other preprocedural examination: Secondary | ICD-10-CM | POA: Insufficient documentation

## 2017-03-18 DIAGNOSIS — Z7982 Long term (current) use of aspirin: Secondary | ICD-10-CM

## 2017-03-18 DIAGNOSIS — E039 Hypothyroidism, unspecified: Secondary | ICD-10-CM | POA: Insufficient documentation

## 2017-03-18 DIAGNOSIS — E785 Hyperlipidemia, unspecified: Secondary | ICD-10-CM | POA: Insufficient documentation

## 2017-03-18 DIAGNOSIS — M1990 Unspecified osteoarthritis, unspecified site: Secondary | ICD-10-CM | POA: Diagnosis not present

## 2017-03-18 DIAGNOSIS — N183 Chronic kidney disease, stage 3 (moderate): Secondary | ICD-10-CM | POA: Insufficient documentation

## 2017-03-18 DIAGNOSIS — I069 Rheumatic aortic valve disease, unspecified: Secondary | ICD-10-CM | POA: Insufficient documentation

## 2017-03-18 DIAGNOSIS — I129 Hypertensive chronic kidney disease with stage 1 through stage 4 chronic kidney disease, or unspecified chronic kidney disease: Secondary | ICD-10-CM | POA: Insufficient documentation

## 2017-03-18 DIAGNOSIS — Z85828 Personal history of other malignant neoplasm of skin: Secondary | ICD-10-CM | POA: Diagnosis not present

## 2017-03-18 DIAGNOSIS — Z79899 Other long term (current) drug therapy: Secondary | ICD-10-CM | POA: Diagnosis not present

## 2017-03-18 DIAGNOSIS — M48061 Spinal stenosis, lumbar region without neurogenic claudication: Secondary | ICD-10-CM | POA: Diagnosis not present

## 2017-03-18 DIAGNOSIS — M4726 Other spondylosis with radiculopathy, lumbar region: Secondary | ICD-10-CM | POA: Diagnosis not present

## 2017-03-18 DIAGNOSIS — I480 Paroxysmal atrial fibrillation: Secondary | ICD-10-CM | POA: Diagnosis not present

## 2017-03-18 DIAGNOSIS — F419 Anxiety disorder, unspecified: Secondary | ICD-10-CM | POA: Diagnosis not present

## 2017-03-18 DIAGNOSIS — M8580 Other specified disorders of bone density and structure, unspecified site: Secondary | ICD-10-CM | POA: Diagnosis not present

## 2017-03-18 DIAGNOSIS — G629 Polyneuropathy, unspecified: Secondary | ICD-10-CM | POA: Diagnosis not present

## 2017-03-18 DIAGNOSIS — M5116 Intervertebral disc disorders with radiculopathy, lumbar region: Secondary | ICD-10-CM | POA: Diagnosis not present

## 2017-03-18 HISTORY — DX: Chronic kidney disease, unspecified: N18.9

## 2017-03-18 HISTORY — DX: Dyspnea, unspecified: R06.00

## 2017-03-18 LAB — BASIC METABOLIC PANEL
Anion gap: 7 (ref 5–15)
BUN: 19 mg/dL (ref 6–20)
CO2: 28 mmol/L (ref 22–32)
Calcium: 10.3 mg/dL (ref 8.9–10.3)
Chloride: 103 mmol/L (ref 101–111)
Creatinine, Ser: 1.6 mg/dL — ABNORMAL HIGH (ref 0.44–1.00)
GFR calc Af Amer: 34 mL/min — ABNORMAL LOW (ref >60)
GFR calc non Af Amer: 30 mL/min — ABNORMAL LOW (ref >60)
Glucose, Bld: 89 mg/dL (ref 65–99)
Potassium: 3.7 mmol/L (ref 3.5–5.1)
Sodium: 138 mmol/L (ref 135–145)

## 2017-03-18 LAB — CBC
HCT: 32.7 % — ABNORMAL LOW (ref 36.0–46.0)
Hemoglobin: 10.8 g/dL — ABNORMAL LOW (ref 12.0–15.0)
MCH: 29.6 pg (ref 26.0–34.0)
MCHC: 33 g/dL (ref 30.0–36.0)
MCV: 89.6 fL (ref 78.0–100.0)
Platelets: 252 10*3/uL (ref 150–400)
RBC: 3.65 MIL/uL — ABNORMAL LOW (ref 3.87–5.11)
RDW: 13.8 % (ref 11.5–15.5)
WBC: 6.3 10*3/uL (ref 4.0–10.5)

## 2017-03-18 LAB — SURGICAL PCR SCREEN
MRSA, PCR: POSITIVE — AB
Staphylococcus aureus: POSITIVE — AB

## 2017-03-18 NOTE — Pre-Procedure Instructions (Signed)
Deborah Shields  03/18/2017      Your procedure is scheduled on Thursday, March 20, 2017  Report to [facility] Admitting Entrance "A" at 8:00 A.M.   Call this number if you have problems the morning of surgery:  [phone]- pre op desk   Remember:  Do not eat food or drink liquids after midnight. Wednesday, September 26  Take these medicines the morning of surgery with A SIP OF WATER: Levothyroxine (SYNTHROID) and Metoprolol tartrate (LOPRESSOR).  Follow your doctor's instruction for taking ASPIRIN.  As of today, stop taking all Aspirin products, Vitamins, Fish oils, and Herbal medications. Also stop all NSAIDS i.e. Advil, Motrin, Aleve, Anaprox, Naproxen, BC and Goody Powders.   Do not wear jewelry, make-up or nail polish.  Do not wear lotions, powders, or perfumes, or deodorant.  Do not shave 48 hours prior to surgery.    Do not bring valuables to the hospital.  [facility] is not responsible for any belongings or valuables.  Contacts, dentures or bridgework may not be worn into surgery.  Leave your suitcase in the car.  After surgery it may be brought to your room.  For patients admitted to the hospital, discharge time will be determined by your treatment team.  Patients discharged the day of surgery will not be allowed to drive home.   Special instructions:  [place]- Preparing For Surgery  Before surgery, you can play an important role. Because skin is not sterile, your skin needs to be as free of germs as possible. You can reduce the number of germs on your skin by washing with CHG (chlorahexidine gluconate) Soap before surgery.  CHG is an antiseptic cleaner which kills germs and bonds with the skin to continue killing germs even after washing.  Please do not use if you have an allergy to CHG or antibacterial soaps. If your skin becomes reddened/irritated stop using the CHG.  Do not shave (including legs and underarms) for at least 48 hours prior to  first CHG shower. It is OK to shave your face.  Please follow these instructions carefully.   1. Shower the NIGHT BEFORE SURGERY and the MORNING OF SURGERY with CHG.   2. If you chose to wash your hair, wash your hair first as usual with your normal shampoo.  3. After you shampoo, rinse your hair and body thoroughly to remove the shampoo.   Wash your face and private area with the soap you use at home, then rinse.  4. Use CHG as you would any other liquid soap. You can apply CHG directly to the skin and wash gently with a scrungie or a clean washcloth.   5. Apply the CHG Soap to your body ONLY FROM THE NECK DOWN.  Do not use on open wounds or open sores. Avoid contact with your eyes, ears, mouth and genitals (private parts). Wash genitals (private parts) with your normal soap.  6. Wash thoroughly, paying special attention to the area where your surgery will be performed.  7. Thoroughly rinse your body with warm water from the neck down.  8. DO NOT shower/wash with your normal soap after using and rinsing off the CHG Soap.  9. Pat yourself dry with a CLEAN TOWEL.   10. Wear CLEAN PAJAMAS   11. Place CLEAN SHEETS on your bed the night of your first shower and DO NOT SLEEP WITH PETS.  Day of Surgery: Shower as above Do not apply any deodorants/lotions, powders or colognes.  Please wear clean clothes to the hospital/surgery center.    Do not wear jewelry, make-up or nail polish.  Do not wear lotions, powders, or perfumes, or deodorant.  Do not shave 48 hours prior to surgery.    Do not bring valuables to the hospital.  [facility] is not responsible for any belongings or valuables.  Contacts, dentures or bridgework may not be worn into surgery.  Leave your suitcase in the car.  After surgery it may be brought to your room.  For patients admitted to the hospital, discharge time will be determined by your treatment team.  Patients discharged the day of surgery will not be allowed to  drive home.   Please read over the following fact sheets that you were given:  Coughing and Deep Breathing and Pain Booklet, Incentive Spirometery

## 2017-03-19 NOTE — Progress Notes (Addendum)
Anesthesia Chart Review:  Pt is a 76 year old female scheduled for left L4-5 laminotomy and microdiscectomy on 03/20/2017 with Jovita Gamma, MD  Surgery was originally scheduled for surgery in August, but it was cancelled due to poor renal function, Cr 2.52 at the time. This has since improved.  Now pt sees Edrick Oh, MD with nephrology  - Primary care at [facility] - Vascular surgeon is Ruta Hinds, MD - Cardiologist is Sanda Klein, MD. Last office visit 12/10/16 with Kerin Ransom, MD  PMH includes:  HTN, carotid artery disease, aortic insufficiency, hyperlipidemia, hypothyroidism, CKD (stage 3), post-op N/V. Never smoker. BMI 21.  Medications include: ASA 81 mg, levothyroxine, metoprolol.   BP 110/70   Pulse 69   Temp 36.7 C   Resp 18   Ht 5\' 5"  (1.651 m)   Wt 110 lb (49.9 kg)   SpO2 100%   BMI 18.30 kg/m    Preoperative labs reviewed.   - Cr 1.6, BUN 19. Per PCP records scanned into media tab, baseline Cr appears to be ~1.6  EKG 11/29/16: NSR. Biatrial enlargement.  US renal 02/06/17:  1. Simple cysts both kidneys. 2. Bilateral extrarenal pelves. No prominent hydronephrosis or focal renal lesion. Bladder is nondistended.  Cardiac event monitor 01/14/17:  - Normal event monitor. No evidence of atrial fibrillation  Echo 12/23/16:  - Left ventricle: The cavity size was normal. Wall thickness wasnormal. Systolic function was normal. The estimated ejectionfraction was in the range of 60% to 65%. Wall motion was normal;there were no regional wall motion abnormalities. Dopplerparameters are consistent with abnormal left ventricularrelaxation (grade 1 diastolic dysfunction). The E/e&' ratio isbetween 8-15, suggesting indeterminate LV filling pressure. - Aortic valve: Trileaflet. Sclerosis without stenosis. Transvalvular velocity was minimally increased. There was trivialregurgitation. Mean gradient (S): 8 mm Hg. Peak gradient (S): 15mm Hg. -  Mitral valve: Mildly thickened leaflets . There was trivial regurgitation. - Left atrium: The atrium was normal in size. - Tricuspid valve: There was mild regurgitation. - Pulmonary arteries: PA peak pressure: 27 mm Hg (S). - Inferior vena cava: The vessel was normal in size. The respirophasic diameter changes were in the normal range (>= 50%),consistent with normal central venous pressure. - Impressions:  Compared to a prior study in 2015, the LVEF is stable at 60-65%.  Carotid duplex 11/14/16:  - <40% B ICA stenosis. No change since 10/26/15.  - multiphasic subclavian arteries.  Distal ICA are tortuous  Nuclear stress test 03/09/14:  - Normal stress nuclear study. LV Wall Motion: NL LV Function, EF 67%; NL Wall Motion  If no changes, I anticipate pt can proceed with surgery as scheduled.   Willeen Cass, FNP-BC [facility] Short Stay Surgical Center/Anesthesiology Phone: [phone] 03/19/2017 10:12 AM

## 2017-03-20 ENCOUNTER — Ambulatory Visit (HOSPITAL_COMMUNITY): Payer: Medicare Other | Admitting: Certified Registered Nurse Anesthetist

## 2017-03-20 ENCOUNTER — Encounter (HOSPITAL_COMMUNITY)
Admission: RE | Disposition: A | Discharge status: Home / Self Care | Payer: Self-pay | Source: Ambulatory Visit | Attending: Neurosurgery

## 2017-03-20 ENCOUNTER — Observation Stay (HOSPITAL_COMMUNITY)
Admission: RE | Admit: 2017-03-20 | Discharge: 2017-03-21 | Disposition: A | Discharge status: Home / Self Care | Payer: Medicare Other | Source: Ambulatory Visit | Attending: Neurosurgery | Admitting: Neurosurgery

## 2017-03-20 ENCOUNTER — Ambulatory Visit (HOSPITAL_COMMUNITY): Payer: Medicare Other | Admitting: Emergency Medicine

## 2017-03-20 ENCOUNTER — Observation Stay (HOSPITAL_COMMUNITY): Payer: Medicare Other

## 2017-03-20 ENCOUNTER — Encounter (HOSPITAL_COMMUNITY): Payer: Self-pay

## 2017-03-20 DIAGNOSIS — G629 Polyneuropathy, unspecified: Secondary | ICD-10-CM | POA: Diagnosis not present

## 2017-03-20 DIAGNOSIS — M4726 Other spondylosis with radiculopathy, lumbar region: Secondary | ICD-10-CM | POA: Insufficient documentation

## 2017-03-20 DIAGNOSIS — I480 Paroxysmal atrial fibrillation: Secondary | ICD-10-CM | POA: Insufficient documentation

## 2017-03-20 DIAGNOSIS — Z85828 Personal history of other malignant neoplasm of skin: Secondary | ICD-10-CM | POA: Insufficient documentation

## 2017-03-20 DIAGNOSIS — I129 Hypertensive chronic kidney disease with stage 1 through stage 4 chronic kidney disease, or unspecified chronic kidney disease: Secondary | ICD-10-CM | POA: Insufficient documentation

## 2017-03-20 DIAGNOSIS — E039 Hypothyroidism, unspecified: Secondary | ICD-10-CM | POA: Insufficient documentation

## 2017-03-20 DIAGNOSIS — N183 Chronic kidney disease, stage 3 (moderate): Secondary | ICD-10-CM | POA: Insufficient documentation

## 2017-03-20 DIAGNOSIS — F419 Anxiety disorder, unspecified: Secondary | ICD-10-CM | POA: Diagnosis not present

## 2017-03-20 DIAGNOSIS — M48061 Spinal stenosis, lumbar region without neurogenic claudication: Principal | ICD-10-CM | POA: Insufficient documentation

## 2017-03-20 DIAGNOSIS — Z7982 Long term (current) use of aspirin: Secondary | ICD-10-CM | POA: Insufficient documentation

## 2017-03-20 DIAGNOSIS — Z419 Encounter for procedure for purposes other than remedying health state, unspecified: Secondary | ICD-10-CM

## 2017-03-20 DIAGNOSIS — Z9842 Cataract extraction status, left eye: Secondary | ICD-10-CM | POA: Insufficient documentation

## 2017-03-20 DIAGNOSIS — M1990 Unspecified osteoarthritis, unspecified site: Secondary | ICD-10-CM | POA: Insufficient documentation

## 2017-03-20 DIAGNOSIS — E785 Hyperlipidemia, unspecified: Secondary | ICD-10-CM | POA: Diagnosis not present

## 2017-03-20 DIAGNOSIS — M5116 Intervertebral disc disorders with radiculopathy, lumbar region: Secondary | ICD-10-CM | POA: Diagnosis not present

## 2017-03-20 DIAGNOSIS — I10 Essential (primary) hypertension: Secondary | ICD-10-CM | POA: Diagnosis not present

## 2017-03-20 DIAGNOSIS — M5126 Other intervertebral disc displacement, lumbar region: Secondary | ICD-10-CM | POA: Diagnosis present

## 2017-03-20 DIAGNOSIS — M8580 Other specified disorders of bone density and structure, unspecified site: Secondary | ICD-10-CM | POA: Diagnosis not present

## 2017-03-20 DIAGNOSIS — M5136 Other intervertebral disc degeneration, lumbar region: Secondary | ICD-10-CM | POA: Diagnosis not present

## 2017-03-20 DIAGNOSIS — Z79899 Other long term (current) drug therapy: Secondary | ICD-10-CM | POA: Insufficient documentation

## 2017-03-20 HISTORY — PX: LUMBAR LAMINECTOMY/DECOMPRESSION MICRODISCECTOMY: SHX5026

## 2017-03-20 SURGERY — LUMBAR LAMINECTOMY/DECOMPRESSION MICRODISCECTOMY 1 LEVEL
Anesthesia: General | Site: Spine Lumbar | Laterality: Left

## 2017-03-20 MED ORDER — MECLIZINE HCL 12.5 MG PO TABS
12.5000 mg | ORAL_TABLET | Freq: Every day | ORAL | Status: DC
  Administered 2017-03-20: 12.5 mg via ORAL
  Filled 2017-03-20: qty 1

## 2017-03-20 MED ORDER — BISACODYL 10 MG RE SUPP: 10.0000 mg | Freq: Every day | RECTAL | Status: DC | PRN

## 2017-03-20 MED ORDER — SODIUM CHLORIDE 0.9% FLUSH: 3.0000 mL | INTRAVENOUS | Status: DC | PRN

## 2017-03-20 MED ORDER — LIDOCAINE-EPINEPHRINE 1 %-1:100000 IJ SOLN
INTRAMUSCULAR | Status: AC
  Filled 2017-03-20: qty 1

## 2017-03-20 MED ORDER — BUPIVACAINE HCL (PF) 0.5 % IJ SOLN
INTRAMUSCULAR | Status: DC | PRN
  Administered 2017-03-20: 10 mL

## 2017-03-20 MED ORDER — CHLORHEXIDINE GLUCONATE CLOTH 2 % EX PADS: 6.0000 | MEDICATED_PAD | Freq: Once | CUTANEOUS | Status: DC

## 2017-03-20 MED ORDER — BUPIVACAINE HCL (PF) 0.5 % IJ SOLN
INTRAMUSCULAR | Status: AC
  Filled 2017-03-20: qty 30

## 2017-03-20 MED ORDER — ACETAMINOPHEN 10 MG/ML IV SOLN
INTRAVENOUS | Status: DC | PRN
  Administered 2017-03-20: 1000 mg via INTRAVENOUS

## 2017-03-20 MED ORDER — METHYLPREDNISOLONE ACETATE 80 MG/ML IJ SUSP
INTRAMUSCULAR | Status: AC
  Filled 2017-03-20: qty 1

## 2017-03-20 MED ORDER — THROMBIN 5000 UNITS EX SOLR
CUTANEOUS | Status: DC | PRN
  Administered 2017-03-20 (×2): 5000 [IU] via TOPICAL

## 2017-03-20 MED ORDER — 0.9 % SODIUM CHLORIDE (POUR BTL) OPTIME
TOPICAL | Status: DC | PRN
  Administered 2017-03-20: 1000 mL

## 2017-03-20 MED ORDER — FLEET ENEMA 7-19 GM/118ML RE ENEM: 1.0000 | ENEMA | Freq: Once | RECTAL | Status: DC | PRN

## 2017-03-20 MED ORDER — HYDROXYZINE HCL 25 MG PO TABS: 50.0000 mg | ORAL_TABLET | ORAL | Status: DC | PRN

## 2017-03-20 MED ORDER — CEFAZOLIN SODIUM-DEXTROSE 2-4 GM/100ML-% IV SOLN
2.0000 g | INTRAVENOUS | Status: AC
  Administered 2017-03-20: 2 g via INTRAVENOUS
  Filled 2017-03-20: qty 100

## 2017-03-20 MED ORDER — LIDOCAINE-EPINEPHRINE 1 %-1:100000 IJ SOLN
INTRAMUSCULAR | Status: DC | PRN
  Administered 2017-03-20: 10 mL

## 2017-03-20 MED ORDER — ACETAMINOPHEN 325 MG PO TABS: 650.0000 mg | ORAL_TABLET | ORAL | Status: DC | PRN

## 2017-03-20 MED ORDER — SUGAMMADEX SODIUM 200 MG/2ML IV SOLN
INTRAVENOUS | Status: AC
  Filled 2017-03-20: qty 2

## 2017-03-20 MED ORDER — LEVOTHYROXINE SODIUM 88 MCG PO TABS
88.0000 ug | ORAL_TABLET | Freq: Every day | ORAL | Status: DC
  Administered 2017-03-21: 88 ug via ORAL
  Filled 2017-03-20: qty 1

## 2017-03-20 MED ORDER — HYDROCODONE-ACETAMINOPHEN 5-325 MG PO TABS
ORAL_TABLET | ORAL | Status: AC
  Filled 2017-03-20: qty 2

## 2017-03-20 MED ORDER — LORAZEPAM 0.5 MG PO TABS
0.5000 mg | ORAL_TABLET | Freq: Every day | ORAL | Status: DC
  Administered 2017-03-20: 0.5 mg via ORAL
  Filled 2017-03-20: qty 1

## 2017-03-20 MED ORDER — FENTANYL CITRATE (PF) 100 MCG/2ML IJ SOLN
INTRAMUSCULAR | Status: DC | PRN
  Administered 2017-03-20: 100 ug via INTRAVENOUS

## 2017-03-20 MED ORDER — LIDOCAINE 2% (20 MG/ML) 5 ML SYRINGE
INTRAMUSCULAR | Status: DC | PRN
  Administered 2017-03-20: 60 mg via INTRAVENOUS

## 2017-03-20 MED ORDER — PROPOFOL 10 MG/ML IV BOLUS
INTRAVENOUS | Status: DC | PRN
Start: 2017-03-20 — End: 2017-03-20
  Administered 2017-03-20: 110 mg via INTRAVENOUS

## 2017-03-20 MED ORDER — HYDROCODONE-ACETAMINOPHEN 5-325 MG PO TABS
1.0000 | ORAL_TABLET | ORAL | Status: DC | PRN
  Administered 2017-03-20 (×2): 1 via ORAL
  Administered 2017-03-20: 2 via ORAL
  Administered 2017-03-21 (×2): 1 via ORAL
  Filled 2017-03-20 (×4): qty 1

## 2017-03-20 MED ORDER — LIDOCAINE 2% (20 MG/ML) 5 ML SYRINGE
INTRAMUSCULAR | Status: AC
  Filled 2017-03-20: qty 5

## 2017-03-20 MED ORDER — HYDROXYZINE HCL 50 MG/ML IM SOLN
50.0000 mg | INTRAMUSCULAR | Status: DC | PRN
Start: 2017-03-20 — End: 2017-03-21

## 2017-03-20 MED ORDER — FENTANYL CITRATE (PF) 100 MCG/2ML IJ SOLN
INTRAMUSCULAR | Status: DC | PRN
  Administered 2017-03-20 (×2): 50 ug via INTRAVENOUS

## 2017-03-20 MED ORDER — SODIUM CHLORIDE 0.9 % IR SOLN
Status: DC | PRN
  Administered 2017-03-20: 500 mL

## 2017-03-20 MED ORDER — CYCLOBENZAPRINE HCL 5 MG PO TABS
5.0000 mg | ORAL_TABLET | Freq: Three times a day (TID) | ORAL | Status: DC | PRN
  Administered 2017-03-20: 10 mg via ORAL

## 2017-03-20 MED ORDER — ROCURONIUM BROMIDE 10 MG/ML (PF) SYRINGE
PREFILLED_SYRINGE | INTRAVENOUS | Status: DC | PRN
  Administered 2017-03-20: 50 mg via INTRAVENOUS

## 2017-03-20 MED ORDER — KCL IN DEXTROSE-NACL 20-5-0.45 MEQ/L-%-% IV SOLN: INTRAVENOUS | Status: DC

## 2017-03-20 MED ORDER — PHENOL 1.4 % MT LIQD: 1.0000 | OROMUCOSAL | Status: DC | PRN

## 2017-03-20 MED ORDER — MAGNESIUM HYDROXIDE 400 MG/5ML PO SUSP: 30.0000 mL | Freq: Every day | ORAL | Status: DC | PRN

## 2017-03-20 MED ORDER — ONDANSETRON HCL 4 MG/2ML IJ SOLN
INTRAMUSCULAR | Status: DC | PRN
  Administered 2017-03-20: 4 mg via INTRAVENOUS

## 2017-03-20 MED ORDER — PROPOFOL 10 MG/ML IV BOLUS
INTRAVENOUS | Status: AC
  Filled 2017-03-20: qty 20

## 2017-03-20 MED ORDER — OXYCODONE HCL 5 MG/5ML PO SOLN: 5.0000 mg | Freq: Once | ORAL | Status: DC | PRN

## 2017-03-20 MED ORDER — PROMETHAZINE HCL 25 MG/ML IJ SOLN: 6.2500 mg | INTRAMUSCULAR | Status: DC | PRN

## 2017-03-20 MED ORDER — FENTANYL CITRATE (PF) 100 MCG/2ML IJ SOLN
INTRAMUSCULAR | Status: AC
  Filled 2017-03-20: qty 2

## 2017-03-20 MED ORDER — HEMOSTATIC AGENTS (NO CHARGE) OPTIME
TOPICAL | Status: DC | PRN
  Administered 2017-03-20: 1 via TOPICAL

## 2017-03-20 MED ORDER — ALUM & MAG HYDROXIDE-SIMETH 200-200-20 MG/5ML PO SUSP: 30.0000 mL | Freq: Four times a day (QID) | ORAL | Status: DC | PRN

## 2017-03-20 MED ORDER — MORPHINE SULFATE (PF) 4 MG/ML IV SOLN: 4.0000 mg | INTRAVENOUS | Status: DC | PRN

## 2017-03-20 MED ORDER — FENTANYL CITRATE (PF) 250 MCG/5ML IJ SOLN
INTRAMUSCULAR | Status: AC
  Filled 2017-03-20: qty 5

## 2017-03-20 MED ORDER — MENTHOL 3 MG MT LOZG: 1.0000 | LOZENGE | OROMUCOSAL | Status: DC | PRN

## 2017-03-20 MED ORDER — CYCLOBENZAPRINE HCL 10 MG PO TABS
ORAL_TABLET | ORAL | Status: AC
  Filled 2017-03-20: qty 1

## 2017-03-20 MED ORDER — SODIUM CHLORIDE 0.9% FLUSH: 3.0000 mL | Freq: Two times a day (BID) | INTRAVENOUS | Status: DC

## 2017-03-20 MED ORDER — SUGAMMADEX SODIUM 200 MG/2ML IV SOLN
INTRAVENOUS | Status: DC | PRN
  Administered 2017-03-20: 200 mg via INTRAVENOUS

## 2017-03-20 MED ORDER — ACETAMINOPHEN 10 MG/ML IV SOLN
INTRAVENOUS | Status: AC
  Filled 2017-03-20: qty 100

## 2017-03-20 MED ORDER — DEXAMETHASONE SODIUM PHOSPHATE 10 MG/ML IJ SOLN
INTRAMUSCULAR | Status: DC | PRN
  Administered 2017-03-20: 10 mg via INTRAVENOUS

## 2017-03-20 MED ORDER — THROMBIN 5000 UNITS EX SOLR
CUTANEOUS | Status: AC
Start: 2017-03-20 — End: 2017-03-20
  Filled 2017-03-20: qty 10000

## 2017-03-20 MED ORDER — METHYLPREDNISOLONE ACETATE 80 MG/ML IJ SUSP
INTRAMUSCULAR | Status: DC | PRN
  Administered 2017-03-20: 80 mg

## 2017-03-20 MED ORDER — MUPIROCIN 2 % EX OINT
1.0000 "application " | TOPICAL_OINTMENT | Freq: Two times a day (BID) | CUTANEOUS | Status: DC
  Administered 2017-03-20 – 2017-03-21 (×2): 1 via NASAL

## 2017-03-20 MED ORDER — THROMBIN 5000 UNITS EX SOLR
CUTANEOUS | Status: AC
  Filled 2017-03-20: qty 15000

## 2017-03-20 MED ORDER — METOPROLOL TARTRATE 25 MG PO TABS
25.0000 mg | ORAL_TABLET | Freq: Two times a day (BID) | ORAL | Status: DC
  Administered 2017-03-20 – 2017-03-21 (×2): 25 mg via ORAL
  Filled 2017-03-20 (×2): qty 1

## 2017-03-20 MED ORDER — OXYCODONE HCL 5 MG PO TABS: 5.0000 mg | ORAL_TABLET | Freq: Once | ORAL | Status: DC | PRN

## 2017-03-20 MED ORDER — ONDANSETRON HCL 4 MG PO TABS: 4.0000 mg | ORAL_TABLET | Freq: Four times a day (QID) | ORAL | Status: DC | PRN

## 2017-03-20 MED ORDER — ROCURONIUM BROMIDE 10 MG/ML (PF) SYRINGE
PREFILLED_SYRINGE | INTRAVENOUS | Status: AC
  Filled 2017-03-20: qty 5

## 2017-03-20 MED ORDER — ACETAMINOPHEN 650 MG RE SUPP: 650.0000 mg | RECTAL | Status: DC | PRN

## 2017-03-20 MED ORDER — DEXAMETHASONE SODIUM PHOSPHATE 10 MG/ML IJ SOLN
INTRAMUSCULAR | Status: AC
Start: 2017-03-20 — End: 2017-03-20
  Filled 2017-03-20: qty 1

## 2017-03-20 MED ORDER — FEBUXOSTAT 40 MG PO TABS
40.0000 mg | ORAL_TABLET | Freq: Every day | ORAL | Status: DC
  Administered 2017-03-20: 40 mg via ORAL
  Filled 2017-03-20: qty 1

## 2017-03-20 MED ORDER — LACTATED RINGERS IV SOLN
INTRAVENOUS | Status: DC
  Administered 2017-03-20 (×2): via INTRAVENOUS

## 2017-03-20 MED ORDER — ONDANSETRON HCL 4 MG/2ML IJ SOLN
INTRAMUSCULAR | Status: AC
  Filled 2017-03-20: qty 2

## 2017-03-20 MED ORDER — CHLORHEXIDINE GLUCONATE CLOTH 2 % EX PADS
6.0000 | MEDICATED_PAD | Freq: Every day | CUTANEOUS | Status: DC
  Administered 2017-03-21: 6 via TOPICAL

## 2017-03-20 MED ORDER — ONDANSETRON HCL 4 MG/2ML IJ SOLN: 4.0000 mg | Freq: Four times a day (QID) | INTRAMUSCULAR | Status: DC | PRN

## 2017-03-20 SURGICAL SUPPLY — 56 items
APL SKNCLS STERI-STRIP NONHPOA (GAUZE/BANDAGES/DRESSINGS)
BAG DECANTER FOR FLEXI CONT (MISCELLANEOUS) ×2 IMPLANT
BENZOIN TINCTURE PRP APPL 2/3 (GAUZE/BANDAGES/DRESSINGS) IMPLANT
BLADE CLIPPER SURG (BLADE) IMPLANT
BUR ACRON 5.0MM COATED (BURR) ×2 IMPLANT
BUR MATCHSTICK NEURO 3.0 LAGG (BURR) ×2 IMPLANT
CANISTER SUCT 3000ML PPV (MISCELLANEOUS) ×2 IMPLANT
CARTRIDGE OIL MAESTRO DRILL (MISCELLANEOUS) ×1 IMPLANT
DECANTER SPIKE VIAL GLASS SM (MISCELLANEOUS) ×4 IMPLANT
DERMABOND ADVANCED (GAUZE/BANDAGES/DRESSINGS) ×1
DERMABOND ADVANCED .7 DNX12 (GAUZE/BANDAGES/DRESSINGS) ×1 IMPLANT
DIFFUSER DRILL AIR PNEUMATIC (MISCELLANEOUS) ×2 IMPLANT
DRAPE LAPAROTOMY 100X72X124 (DRAPES) ×2 IMPLANT
DRAPE MICROSCOPE LEICA (MISCELLANEOUS) ×2 IMPLANT
DRAPE POUCH INSTRU U-SHP 10X18 (DRAPES) ×2 IMPLANT
ELECT REM PT RETURN 9FT ADLT (ELECTROSURGICAL) ×2
ELECTRODE REM PT RTRN 9FT ADLT (ELECTROSURGICAL) ×1 IMPLANT
GAUZE SPONGE 4X4 12PLY STRL (GAUZE/BANDAGES/DRESSINGS) ×2 IMPLANT
GAUZE SPONGE 4X4 16PLY XRAY LF (GAUZE/BANDAGES/DRESSINGS) IMPLANT
GLOVE BIOGEL PI IND STRL 7.0 (GLOVE) ×1 IMPLANT
GLOVE BIOGEL PI IND STRL 8 (GLOVE) ×1 IMPLANT
GLOVE BIOGEL PI INDICATOR 7.0 (GLOVE) ×1
GLOVE BIOGEL PI INDICATOR 8 (GLOVE) ×1
GLOVE ECLIPSE 7.5 STRL STRAW (GLOVE) ×2 IMPLANT
GLOVE EXAM NITRILE LRG STRL (GLOVE) IMPLANT
GLOVE EXAM NITRILE XL STR (GLOVE) IMPLANT
GLOVE EXAM NITRILE XS STR PU (GLOVE) IMPLANT
GOWN STRL REUS W/ TWL LRG LVL3 (GOWN DISPOSABLE) ×1 IMPLANT
GOWN STRL REUS W/ TWL XL LVL3 (GOWN DISPOSABLE) ×1 IMPLANT
GOWN STRL REUS W/TWL 2XL LVL3 (GOWN DISPOSABLE) IMPLANT
GOWN STRL REUS W/TWL LRG LVL3 (GOWN DISPOSABLE) ×1
GOWN STRL REUS W/TWL XL LVL3 (GOWN DISPOSABLE) ×1
KIT BASIN OR (CUSTOM PROCEDURE TRAY) ×2 IMPLANT
KIT ROOM TURNOVER OR (KITS) ×2 IMPLANT
NEEDLE HYPO 18GX1.5 BLUNT FILL (NEEDLE) ×2 IMPLANT
NEEDLE SPNL 18GX3.5 QUINCKE PK (NEEDLE) ×2 IMPLANT
NEEDLE SPNL 22GX3.5 QUINCKE BK (NEEDLE) ×2 IMPLANT
NS IRRIG 1000ML POUR BTL (IV SOLUTION) ×2 IMPLANT
OIL CARTRIDGE MAESTRO DRILL (MISCELLANEOUS) ×2
PACK LAMINECTOMY NEURO (CUSTOM PROCEDURE TRAY) ×2 IMPLANT
PAD ARMBOARD 7.5X6 YLW CONV (MISCELLANEOUS) ×10 IMPLANT
PATTIES SURGICAL .5 X1 (DISPOSABLE) IMPLANT
RUBBERBAND STERILE (MISCELLANEOUS) ×4 IMPLANT
SPONGE LAP 4X18 X RAY DECT (DISPOSABLE) IMPLANT
SPONGE SURGIFOAM ABS GEL SZ50 (HEMOSTASIS) ×2 IMPLANT
STRIP CLOSURE SKIN 1/2X4 (GAUZE/BANDAGES/DRESSINGS) IMPLANT
SUT PROLENE 6 0 BV (SUTURE) IMPLANT
SUT VIC AB 1 CT1 18XBRD ANBCTR (SUTURE) ×1 IMPLANT
SUT VIC AB 1 CT1 8-18 (SUTURE) ×1
SUT VIC AB 2-0 CP2 18 (SUTURE) ×4 IMPLANT
SUT VIC AB 3-0 SH 8-18 (SUTURE) IMPLANT
SYR 5ML LL (SYRINGE) ×2 IMPLANT
TAPE CLOTH SURG 4X10 WHT LF (GAUZE/BANDAGES/DRESSINGS) ×2 IMPLANT
TOWEL GREEN STERILE (TOWEL DISPOSABLE) ×2 IMPLANT
TOWEL GREEN STERILE FF (TOWEL DISPOSABLE) ×2 IMPLANT
WATER STERILE IRR 1000ML POUR (IV SOLUTION) ×2 IMPLANT

## 2017-03-20 NOTE — Transfer of Care (Signed)
Immediate Anesthesia Transfer of Care Note  Patient: Deborah Shields  Procedure(s) Performed: Procedure(s) with comments: LEFT LUMBAR FOURLUMBAR FIVE LAMINOTOMY AND MICROICRODISCECTOMY 1 LEVEL (Left) - LEFT  Patient Location: PACU  Anesthesia Type:General  Level of Consciousness: awake, alert  and oriented  Airway & Oxygen Therapy: Patient Spontanous Breathing and Patient connected to face mask oxygen  Post-op Assessment: Report given to RN and Post -op Vital signs reviewed and stable  Post vital signs: Reviewed and stable  Last Vitals:  Vitals:   03/20/17 0825  BP: (!) 148/83  Pulse: 68  Resp: 16  Temp: 36.6 C  SpO2: 100%    Last Pain:  Vitals:   03/20/17 0825  TempSrc: Oral  PainSc:       Patients Stated Pain Goal: 2 (28/24/17 5301)  Complications: No apparent anesthesia complications

## 2017-03-20 NOTE — Anesthesia Preprocedure Evaluation (Signed)
Anesthesia Evaluation  Patient identified by MRN, date of birth, ID band Patient awake    Reviewed: Allergy & Precautions, NPO status , Patient's Chart, lab work & pertinent test results, reviewed documented beta blocker date and time   History of Anesthesia Complications (+) PONV  Airway Mallampati: II  TM Distance: >3 FB Neck ROM: Full    Dental no notable dental hx.    Pulmonary neg pulmonary ROS,    Pulmonary exam normal breath sounds clear to auscultation       Cardiovascular hypertension, Pt. on medications and Pt. on home beta blockers negative cardio ROS Normal cardiovascular exam Rhythm:Regular Rate:Normal     Neuro/Psych Anxiety negative neurological ROS  negative psych ROS   GI/Hepatic negative GI ROS, Neg liver ROS,   Endo/Other  negative endocrine ROSHypothyroidism   Renal/GU Renal InsufficiencyRenal diseasenegative Renal ROS  negative genitourinary   Musculoskeletal negative musculoskeletal ROS (+) Arthritis ,   Abdominal   Peds negative pediatric ROS (+)  Hematology negative hematology ROS (+)   Anesthesia Other Findings   Reproductive/Obstetrics negative OB ROS                             Anesthesia Physical Anesthesia Plan  ASA: III  Anesthesia Plan: General   Post-op Pain Management:    Induction: Intravenous  PONV Risk Score and Plan: 4 or greater and Ondansetron, Dexamethasone, Midazolam and Treatment may vary due to age or medical condition  Airway Management Planned: Oral ETT  Additional Equipment:   Intra-op Plan:   Post-operative Plan: Extubation in OR  Informed Consent: I have reviewed the patients History and Physical, chart, labs and discussed the procedure including the risks, benefits and alternatives for the proposed anesthesia with the patient or authorized representative who has indicated his/her understanding and acceptance.   Dental  advisory given  Plan Discussed with: CRNA  Anesthesia Plan Comments:         Anesthesia Quick Evaluation

## 2017-03-20 NOTE — H&P (Signed)
Subjective: Patient is a 76 y.o. female who is admitted for treatment of left L4-5 lumbar disc herniation, with a free fragment has migrated caudally behind the body of L5 on the left side. She's been having difficulties with low back and left lumbar radicular pain, and on examination has been found to have weakness in the distal left lower extremity with the left dorsiflexor being 4 and the left EHL being 3. She is admitted now for a left L4-5 lumbar laminotomy and microdiscectomy.    Patient Active Problem List   Diagnosis Date Noted  . Edema of both feet 12/10/2016  . Tick bite 11/21/2015  . Abnormal urine odor 11/21/2015  . Shortness of breath 11/21/2015  . Fever, unspecified 11/21/2015  . Other fatigue 11/21/2015  . Anxiety 08/15/2015  . Encounter for pre-operative cardiovascular clearance 07/10/2015  . Pre-operative clearance 07/07/2015  . Rotator cuff tear 07/07/2015  . Rectocele, female 08/04/2014  . Dyspnea on exertion 02/14/2014  . Other malaise and fatigue 12/31/2013  . PAF (paroxysmal atrial fibrillation) ([place]) 01/10/2013  . Thrombosed external hemorrhoid 11/26/2012  . Chronic renal insufficiency, stage III (moderate) 09/03/2011  . Atherosclerosis of native arteries of the extremities with intermittent claudication 04/04/2011  . Occlusion and stenosis of carotid artery without mention of cerebral infarction 04/04/2011  . PERIPHERAL NEUROPATHY 08/15/2008  . Hypothyroidism 08/12/2007  . HYPERLIPIDEMIA 08/12/2007  . Essential hypertension 08/12/2007  . Osteopenia 08/12/2007  . Carotid artery stenosis, asymptomatic 08/11/2007  . Osteoarthritis 08/11/2007  . BASAL CELL CARCINOMA, HX OF 08/11/2007  . FIBROCYSTIC BREAST DISEASE 11/18/2006  . DIVERTICULOSIS, COLON 08/10/2006   Past Medical History:  Diagnosis Date  . Anxiety   . Aortic insufficiency    mild due to degenerative changes  . Arthritis   . Basal cell carcinoma 2007   [place] [facility] Left leg  . Carotid artery  occlusion   . Chronic kidney disease    CRD Stage 3  . Conjunctivitis due to adenovirus, both eyes   . Degenerative joint disease   . Diverticulosis of colon   . Dyspnea    with exertion  . Heart murmur   . Hyperlipidemia    NMR 07/2009: LDL 200 (2260/1233)TG 99, HDL 65. LDL goal =<120, ideally <100. father MI @ 66  . Hypertension   . Hypothyroidism    Dr Wilson Singer  . Microscopic hematuria   . Peripheral neuropathy    compressive in UE bilaterally; Dr Daylene Katayama  . Peripheral vascular disease ([place])    ICA bilat, Dr.Charles fields, [facility]  . Pneumonia    2017  . PONV (postoperative nausea and vomiting)    "Inner ear, does  okay with Scopolamine"  . Rectocele   . [place] spotted fever     Past Surgical History:  Procedure Laterality Date  . ABDOMINAL HYSTERECTOMY  1973   age 84 due to dysfuctional menses; HRT x 25-30 years  . APPENDECTOMY  1952  . basal cell cancer  03/2006   leg  . BILATERAL OOPHORECTOMY  1990   prophylactically (sister had ovarian ca)  . CARPAL TUNNEL RELEASE Bilateral 1989   right  . CATARACT EXTRACTION, BILATERAL  2010   Dr Kathrin Penner  . CHOLECYSTECTOMY  2006  . COLONOSCOPY     Dr Ardis Hughs  . EYE SURGERY Left    Bilateral eye (film removed lt eye 01/09/17)  . NM MYOVIEW LTD  06/13/2008   low risk scan  . ORTHOPEDIC SURGERY  1989/200/2002/2012   elbows,shoulder surgery x 3, right  hand  . RECTOCELE REPAIR  2016  . TONSILLECTOMY  1957  . US ECHOCARDIOGRAPHY  05/01/2010   trace MR,AI,TR;EF =>55%    Facility-Administered Medications Prior to Admission  Medication Dose Route Frequency Provider Last Rate Last Dose  . 0.9 %  sodium chloride infusion  500 mL Intravenous Continuous Milus Banister, MD       Prescriptions Prior to Admission  Medication Sig Dispense Refill Last Dose  . b complex vitamins tablet Take 1 tablet by mouth daily after supper.    Taking  . bisacodyl (DULCOLAX) 5 MG EC tablet Take 5 mg by mouth daily as needed for moderate  constipation.     . cyclobenzaprine (FLEXERIL) 10 MG tablet Take 10 mg by mouth daily as needed for muscle spasms.     . diclofenac sodium (VOLTAREN) 1 % GEL Apply 1 application topically as needed (for pain).     . febuxostat (ULORIC) 40 MG tablet Take 40 mg by mouth at bedtime.     Marland Kitchen levothyroxine (SYNTHROID) 88 MCG tablet Take 88 mcg by mouth daily before breakfast. Taking the brand "synthroid"   Taking  . LORazepam (ATIVAN) 0.5 MG tablet Take 1 tablet (0.5 mg total) by mouth 3 (three) times daily as needed. (Patient taking differently: Take 0.5 mg by mouth at bedtime. ) 90 tablet 1 Taking  . meclizine (ANTIVERT) 25 MG tablet Take 1 tablet (25 mg total) by mouth daily. 1/2 every 8 hrs prn for imbalance (Patient taking differently: Take 12.5 mg by mouth at bedtime. ) 30 tablet 2 Taking  . metoprolol tartrate (LOPRESSOR) 25 MG tablet Take 1 tablet (25 mg total) by mouth 2 (two) times daily. 180 tablet 1 Taking  . Multiple Vitamins-Minerals (CENTRUM SILVER PO) Take 1 tablet by mouth daily after supper.    Taking  . psyllium (REGULOID) 0.52 g capsule Take 0.52 g by mouth daily. FIBER     . traMADol (ULTRAM) 50 MG tablet 1/2-1 by mouth once daily as needed. (Patient taking differently: Take 25 mg by mouth at bedtime. ) 90 tablet 1 Taking  . aspirin 81 MG tablet Take 81 mg by mouth at bedtime.    Taking   Allergies  Allergen Reactions  . Aspirin Nausea Only and Other (See Comments)    REACTION: GI upset  ( pt can take 81 mg but NOT   325 mg ASA )  . Lovastatin Nausea Only and Other (See Comments)    REACTION: nausea  . Benazepril Hcl Other (See Comments)    Unknown  . Paroxetine Other (See Comments)    Unknown  . Bupropion Hcl Other (See Comments)    REACTION: tinnitis  . Ezetimibe Nausea And Vomiting and Other (See Comments)    REACTION: GI symptoms  . Fenofibrate Other (See Comments)    Myalgia   . Pravastatin Sodium Other (See Comments)    REACTION: elevated CPK - Muscle's in bilateral  Leg    Social History  Substance Use Topics  . Smoking status: Never Smoker  . Smokeless tobacco: Never Used  . Alcohol use No    Family History  Problem Relation Age of Onset  . Heart attack Father 27  . Heart disease Father        Before age 22  . Colon cancer Mother 63  . Stroke Mother 84  . Kidney disease Mother        ? renal calculi; S/P resecton of kidney  . Cancer - Colon Mother 22  .  Ovarian cancer Sister   . Other Sister        Valve replacement (aortic & mitral ) in 2 sisters  . Heart disease Sister        Before age 64  . Heart disease Brother        aortic & mitral valve replacement in 2 bro; both had CBAG  . Diabetes Neg Hx      Review of Systems Pertinent items noted in HPI and remainder of comprehensive ROS otherwise negative.  EXAM: Patient is a thin elderly white female in no acute distress. Lungs are clear to auscultation , the patient has symmetrical respiratory excursion. Heart has a regular rate and rhythm normal S1 and S2 no murmur.   Abdomen is soft nontender nondistended bowel sounds are present. Extremity examination shows no clubbing cyanosis or edema. Neurologic examination shows 5/5 strength in the iliopsoas and quadriceps bilaterally, as well as the right dorsiflexor, EHL, and plantar flexor. However the left was flexors for the left EHL is 3, and the left plantar flexors 5.  Data Review:CBC    Component Value Date/Time   WBC 6.3 03/18/2017 1408   RBC 3.65 (L) 03/18/2017 1408   HGB 10.8 (L) 03/18/2017 1408   HCT 32.7 (L) 03/18/2017 1408   PLT 252 03/18/2017 1408   MCV 89.6 03/18/2017 1408   MCH 29.6 03/18/2017 1408   MCHC 33.0 03/18/2017 1408   RDW 13.8 03/18/2017 1408   LYMPHSABS 2.1 11/21/2015 1156   MONOABS 0.7 11/21/2015 1156   EOSABS 0.1 11/21/2015 1156   BASOSABS 0.0 11/21/2015 1156                          BMET    Component Value Date/Time   NA 138 03/18/2017 1408   NA 136 11/29/2016 1033   K 3.7 03/18/2017 1408   CL 103  03/18/2017 1408   CO2 28 03/18/2017 1408   GLUCOSE 89 03/18/2017 1408   BUN 19 03/18/2017 1408   BUN 24 11/29/2016 1033   CREATININE 1.60 (H) 03/18/2017 1408   CALCIUM 10.3 03/18/2017 1408   GFRNONAA 30 (L) 03/18/2017 1408   GFRAA 34 (L) 03/18/2017 1408     Assessment/Plan: Patient with low back and left lumbar radicular pain, with weakness of the left dorsiflexor and EHL, secondary to a left L4-5 lumbar disc condition, with a fragment has migrated caudally behind the body of L5. Patient is now for a left L4-5 lumbar laminotomy and microdiscectomy.  I've discussed with the patient the nature of his condition, the nature the surgical procedure, the typical length of surgery, hospital stay, and overall recuperation. We discussed limitations postoperatively. I discussed risks of surgery including risks of infection, bleeding, possibly need for transfusion, the risk of nerve root dysfunction with pain, weakness, numbness, or paresthesias, or risk of dural tear and CSF leakage and possible need for further surgery, the risk of recurrent disc herniation and the possible need for further surgery, and the risk of anesthetic complications including myocardial infarction, stroke, pneumonia, and death. Understanding all this the patient does wish to proceed with surgery and is admitted for such.    Hosie Spangle, MD 03/20/2017 7:52 AM

## 2017-03-20 NOTE — Anesthesia Postprocedure Evaluation (Signed)
Anesthesia Post Note  Patient: Deborah Shields  Procedure(s) Performed: Procedure(s) (LRB): LEFT LUMBAR FOURLUMBAR FIVE LAMINOTOMY AND MICROICRODISCECTOMY 1 LEVEL (Left)     Patient location during evaluation: PACU Anesthesia Type: General Level of consciousness: awake and alert Pain management: pain level controlled Vital Signs Assessment: post-procedure vital signs reviewed and stable Respiratory status: spontaneous breathing, nonlabored ventilation and respiratory function stable Cardiovascular status: blood pressure returned to baseline and stable Postop Assessment: no apparent nausea or vomiting Anesthetic complications: no    Last Vitals:  Vitals:   03/20/17 1245 03/20/17 1300  BP: (!) 166/89 (!) 156/82  Pulse: 70 69  Resp: 15 19  Temp:    SpO2: 95% 95%    Last Pain:  Vitals:   03/20/17 0825  TempSrc: Oral  PainSc:                  Lynda Rainwater

## 2017-03-20 NOTE — Anesthesia Procedure Notes (Signed)
Procedure Name: Intubation Date/Time: 03/20/2017 9:53 AM Performed by: Genelle Bal Pre-anesthesia Checklist: Patient identified, Emergency Drugs available, Suction available and Patient being monitored Patient Re-evaluated:Patient Re-evaluated prior to induction Oxygen Delivery Method: Circle system utilized Preoxygenation: Pre-oxygenation with 100% oxygen Induction Type: IV induction Ventilation: Mask ventilation without difficulty Laryngoscope Size: Miller and 2 Grade View: Grade I Tube type: Oral Tube size: 7.0 mm Number of attempts: 1 Airway Equipment and Method: Stylet and Oral airway Placement Confirmation: ETT inserted through vocal cords under direct vision,  positive ETCO2 and breath sounds checked- equal and bilateral Secured at: 21 cm Tube secured with: Tape Dental Injury: Teeth and Oropharynx as per pre-operative assessment

## 2017-03-20 NOTE — Progress Notes (Signed)
Vitals:   03/20/17 1245 03/20/17 1300 03/20/17 1315 03/20/17 1349  BP: (!) 166/89 (!) 156/82  (!) 167/79  Pulse: 70 69 74 69  Resp: 15 19 15 16   Temp:   98 F (36.7 C) (!) 97.5 F (36.4 C)  TempSrc:      SpO2: 95% 95% 95% 100%  Weight:      Height:        CBC  Recent Labs  03/18/17 1408  WBC 6.3  HGB 10.8*  HCT 32.7*  PLT 252   BMET  Recent Labs  03/18/17 1408  NA 138  K 3.7  CL 103  CO2 28  GLUCOSE 89  BUN 19  CREATININE 1.60*  CALCIUM 10.3    Patient up and ambulating in the halls, with a staff. Comfortable. Voiding well. Dressing clean and dry.  Plan: Encouraged to ambulate 1 or 2 more times this evening. Continue to progress through postoperative recovery.  Hosie Spangle, MD 03/20/2017, 5:42 PM

## 2017-03-20 NOTE — Op Note (Signed)
03/20/2017  11:22 AM  PATIENT:  Deborah Shields  76 y.o. female  PRE-OPERATIVE DIAGNOSIS:  Left L4-5 lumbar disc herniation with low back pain and left lumbar radiculopathy, lumbar stenosis, lumbar spondylosis, lumbar degenerative disc disease  POST-OPERATIVE DIAGNOSIS:  Left L4-5 lumbar disc herniation with low back pain and left lumbar radiculopathy, lumbar stenosis, lumbar spondylosis, lumbar degenerative disc disease  PROCEDURE:  Procedure(s):  Left L4-5 lumbar laminotomy and microdiscectomy with microdissection, microsurgical technique, and the operating microscope  SURGEON:  Surgeon(s): Jovita Gamma, MD  ANESTHESIA:   general  EBL:  Total I/O In: 1200 [I.V.:1200] Out: 60 [Blood:60]  BLOOD ADMINISTERED:none  COUNT: Correct per nursing staff  DICTATION: Patient was brought to the operating room and placed under general endotracheal anesthesia. Patient was turned to prone position the lumbar region was prepped with Betadine soap and solution and draped in a sterile fashion. The midline was infiltrated with local anesthetic with epinephrine. A localizing x-ray was taken and the L4-5 level was identified. Midline incision was made over the L4-5 level and was carried down through the subcutaneous tissue to the lumbar fascia. The lumbar fascia was incised on the left side and the paraspinal muscles were dissected from the spinous processes and lamina in a subperiosteal fashion. Another x-ray was taken and the L4-5 intralaminar space was identified. The operating microscope was draped and brought into the field provided additional magnification, illumination, and visualization. Laminotomy was performed using the high-speed drill and Kerrison punches. The ligamentum flavum was carefully resected. The ligamentum flavum was found to be thickened, and was carefully removed decompressing the dorsal lateral aspect of the thecal sac. The exiting left L5 nerve root was identified. The disc  herniation was identified. It was located caudal to the disc space, behind the left side of the body of L5. The thecal sac and nerve root gently retracted medially. The free fragment disc herniation was mobilized. It is removed in a piecemeal fashion decompressing the thecal sac and exiting left L5 nerve root. The epidural space was thoroughly explored, and all loose fragments of disc material removed. Once the discectomy was completed and good decompression of the thecal sac and nerve had been achieved hemostasis was established with the use of bipolar cautery and Gelfoam with thrombin. The Gelfoam was removed and hemostasis confirmed. We then instilled 2 cc of fentanyl and 80 mg of Depo-Medrol into the epidural space. Deep fascia was closed with interrupted undyed 1 Vicryl sutures. Scarpa's fascia was closed with interrupted undyed 2-0 Vicryl sutures, in the subcutaneous and subcuticular layer were closed with interrupted inverted 2-0 undyed Vicryl sutures. The skin edges were approximated with Dermabond. A dressing of sterile gauze and Hypafix was applied. Following surgery the patient was turned back to a supine position to be reversed from the anesthetic extubated and transferred to the recovery room for further care.   PLAN OF CARE: Admit for overnight observation  PATIENT DISPOSITION:  PACU - hemodynamically stable.   Delay start of Pharmacological VTE agent (>24hrs) due to surgical blood loss or risk of bleeding:  yes

## 2017-03-21 ENCOUNTER — Encounter (HOSPITAL_COMMUNITY): Payer: Self-pay | Admitting: Neurosurgery

## 2017-03-21 DIAGNOSIS — E039 Hypothyroidism, unspecified: Secondary | ICD-10-CM | POA: Diagnosis not present

## 2017-03-21 DIAGNOSIS — M4726 Other spondylosis with radiculopathy, lumbar region: Secondary | ICD-10-CM | POA: Diagnosis not present

## 2017-03-21 DIAGNOSIS — I129 Hypertensive chronic kidney disease with stage 1 through stage 4 chronic kidney disease, or unspecified chronic kidney disease: Secondary | ICD-10-CM | POA: Diagnosis not present

## 2017-03-21 DIAGNOSIS — M48061 Spinal stenosis, lumbar region without neurogenic claudication: Secondary | ICD-10-CM | POA: Diagnosis not present

## 2017-03-21 DIAGNOSIS — N183 Chronic kidney disease, stage 3 (moderate): Secondary | ICD-10-CM | POA: Diagnosis not present

## 2017-03-21 DIAGNOSIS — M5116 Intervertebral disc disorders with radiculopathy, lumbar region: Secondary | ICD-10-CM | POA: Diagnosis not present

## 2017-03-21 MED ORDER — HYDROCODONE-ACETAMINOPHEN 5-325 MG PO TABS: 1.0000 | ORAL_TABLET | ORAL | 0 refills | Status: DC | PRN

## 2017-03-21 NOTE — Discharge Summary (Signed)
Physician Discharge Summary  Patient ID: Deborah Shields MRN: [id number] DOB/AGE: August 12, 1940 76 y.o.  Admit date: 03/20/2017 Discharge date: 03/21/2017  Admission Diagnoses:  Left L4-5 lumbar disc herniation with low back pain and left lumbar radiculopathy, lumbar stenosis, lumbar spondylosis, lumbar degenerative disc disease  Discharge Diagnoses:  Left L4-5 lumbar disc herniation with low back pain and left lumbar radiculopathy, lumbar stenosis, lumbar spondylosis, lumbar degenerative disc disease Active Problems:   HNP (herniated nucleus pulposus), lumbar   Discharged Condition: good  Hospital Course: Patient was admitted, underwent a left L4-5 lumbar laminotomy micro-discectomy. She is done well following surgery. She is up and ambulating actively. She is being discharged home with instructions regarding wound care and activities. She is scheduled to follow-up with me in the office in 3-4 weeks.  Discharge Exam: Blood pressure (!) 123/58, pulse 74, temperature 97.9 F (36.6 C), temperature source Oral, resp. rate 18, height 5' 0.5" (1.537 m), weight 49.9 kg (110 lb), SpO2 98 %.  Disposition: 01-Home or Self Care  Discharge Instructions    Discharge wound care:    Complete by:  As directed    Leave the wound open to air. Shower daily with the wound uncovered. Water and soapy water should run over the incision area. Do not wash directly on the incision for 2 weeks. Remove the glue after 2 weeks.   Driving Restrictions    Complete by:  As directed    No driving for 2 weeks. May ride in the car locally now. May begin to drive locally in 2 weeks.   Other Restrictions    Complete by:  As directed    Walk gradually increasing distances out in the fresh air at least twice a day. Walking additional 6 times inside the house, gradually increasing distances, daily. No bending, lifting, or twisting. Perform activities between shoulder and waist height (that is at counter height when standing or  table height when sitting).     Allergies as of 03/21/2017      Reactions   Aspirin Nausea Only, Other (See Comments)   REACTION: GI upset  ( pt can take 81 mg but NOT   325 mg ASA )   Lovastatin Nausea Only, Other (See Comments)   REACTION: nausea   Benazepril Hcl Other (See Comments)   Unknown   Paroxetine Other (See Comments)   Unknown   Bupropion Hcl Other (See Comments)   REACTION: tinnitis   Ezetimibe Nausea And Vomiting, Other (See Comments)   REACTION: GI symptoms   Fenofibrate Other (See Comments)   Myalgia   Pravastatin Sodium Other (See Comments)   REACTION: elevated CPK - Muscle's in bilateral Leg      Medication List    TAKE these medications   aspirin 81 MG tablet Take 81 mg by mouth at bedtime.   b complex vitamins tablet Take 1 tablet by mouth daily after supper.   bisacodyl 5 MG EC tablet Commonly known as:  DULCOLAX Take 5 mg by mouth daily as needed for moderate constipation.   CENTRUM SILVER PO Take 1 tablet by mouth daily after supper.   cyclobenzaprine 10 MG tablet Commonly known as:  FLEXERIL Take 10 mg by mouth daily as needed for muscle spasms.   diclofenac sodium 1 % Gel Commonly known as:  VOLTAREN Apply 1 application topically as needed (for pain).   febuxostat 40 MG tablet Commonly known as:  ULORIC Take 40 mg by mouth at bedtime.   HYDROcodone-acetaminophen 5-325 MG tablet Commonly  known as:  NORCO/VICODIN Take 1 tablet by mouth every 4 (four) hours as needed (pain).   LORazepam 0.5 MG tablet Commonly known as:  ATIVAN Take 1 tablet (0.5 mg total) by mouth 3 (three) times daily as needed. What changed:  when to take this   meclizine 25 MG tablet Commonly known as:  ANTIVERT Take 1 tablet (25 mg total) by mouth daily. 1/2 every 8 hrs prn for imbalance What changed:  how much to take  when to take this  additional instructions   metoprolol tartrate 25 MG tablet Commonly known as:  LOPRESSOR Take 1 tablet (25 mg total)  by mouth 2 (two) times daily.   psyllium 0.52 g capsule Commonly known as:  REGULOID Take 0.52 g by mouth daily. FIBER   SYNTHROID 88 MCG tablet Generic drug:  levothyroxine Take 88 mcg by mouth daily before breakfast. Taking the brand "synthroid"   traMADol 50 MG tablet Commonly known as:  ULTRAM 1/2-1 by mouth once daily as needed. What changed:  how much to take  how to take this  when to take this  additional instructions            Discharge Care Instructions        Start     Ordered   03/21/17 0000  HYDROcodone-acetaminophen (NORCO/VICODIN) 5-325 MG tablet  Every 4 hours PRN     03/21/17 0754   03/21/17 0000  Driving Restrictions    Comments:  No driving for 2 weeks. May ride in the car locally now. May begin to drive locally in 2 weeks.   03/21/17 0754   03/21/17 0000  Other Restrictions    Comments:  Walk gradually increasing distances out in the fresh air at least twice a day. Walking additional 6 times inside the house, gradually increasing distances, daily. No bending, lifting, or twisting. Perform activities between shoulder and waist height (that is at counter height when standing or table height when sitting).   03/21/17 0754   03/21/17 0000  Discharge wound care:    Comments:  Leave the wound open to air. Shower daily with the wound uncovered. Water and soapy water should run over the incision area. Do not wash directly on the incision for 2 weeks. Remove the glue after 2 weeks.   03/21/17 0754       Signed: Hosie Spangle 03/21/2017, 7:55 AM

## 2017-03-21 NOTE — Care Management Obs Status (Signed)
[place] NOTIFICATION   Patient Details  Name: Deborah Shields MRN: [id number] Date of Birth: [birth year]   Medicare Observation Status Notification Given:  Other (see comment) Patient discharged 0prior to Case manager giving obs notice.    Ninfa Meeker, RN 03/21/2017, 10:29 AM

## 2017-03-21 NOTE — Discharge Instructions (Signed)

## 2017-03-21 NOTE — Progress Notes (Signed)
Patient alert and oriented, mae's well, voiding adequate amount of urine, swallowing without difficulty, c/o mild pain at time of discharge. And medication given prior to discharged. Patient discharged home with family. Script and discharged instructions given to patient. Patient and family stated understanding of instructions given. Patient has an appointment with Dr. Sherwood Gambler

## 2017-04-07 ENCOUNTER — Other Ambulatory Visit: Payer: Self-pay | Admitting: Internal Medicine

## 2017-04-07 DIAGNOSIS — Z1231 Encounter for screening mammogram for malignant neoplasm of breast: Secondary | ICD-10-CM

## 2017-05-19 DIAGNOSIS — M7989 Other specified soft tissue disorders: Secondary | ICD-10-CM | POA: Diagnosis not present

## 2017-05-19 DIAGNOSIS — M2550 Pain in unspecified joint: Secondary | ICD-10-CM | POA: Diagnosis not present

## 2017-05-19 DIAGNOSIS — M549 Dorsalgia, unspecified: Secondary | ICD-10-CM | POA: Diagnosis not present

## 2017-05-19 DIAGNOSIS — I10 Essential (primary) hypertension: Secondary | ICD-10-CM | POA: Diagnosis not present

## 2017-05-19 DIAGNOSIS — M5442 Lumbago with sciatica, left side: Secondary | ICD-10-CM | POA: Diagnosis not present

## 2017-05-19 DIAGNOSIS — M79671 Pain in right foot: Secondary | ICD-10-CM | POA: Diagnosis not present

## 2017-05-19 DIAGNOSIS — E039 Hypothyroidism, unspecified: Secondary | ICD-10-CM | POA: Diagnosis not present

## 2017-05-19 DIAGNOSIS — M1990 Unspecified osteoarthritis, unspecified site: Secondary | ICD-10-CM | POA: Diagnosis not present

## 2017-05-19 DIAGNOSIS — M109 Gout, unspecified: Secondary | ICD-10-CM | POA: Diagnosis not present

## 2017-05-19 DIAGNOSIS — R768 Other specified abnormal immunological findings in serum: Secondary | ICD-10-CM | POA: Diagnosis not present

## 2017-05-19 DIAGNOSIS — Z5181 Encounter for therapeutic drug level monitoring: Secondary | ICD-10-CM | POA: Diagnosis not present

## 2017-05-19 DIAGNOSIS — M19049 Primary osteoarthritis, unspecified hand: Secondary | ICD-10-CM | POA: Diagnosis not present

## 2017-05-20 DIAGNOSIS — H04563 Stenosis of bilateral lacrimal punctum: Secondary | ICD-10-CM | POA: Diagnosis not present

## 2017-05-20 DIAGNOSIS — H04221 Epiphora due to insufficient drainage, right lacrimal gland: Secondary | ICD-10-CM | POA: Diagnosis not present

## 2017-05-20 DIAGNOSIS — H02102 Unspecified ectropion of right lower eyelid: Secondary | ICD-10-CM | POA: Diagnosis not present

## 2017-05-27 DIAGNOSIS — M5442 Lumbago with sciatica, left side: Secondary | ICD-10-CM | POA: Diagnosis not present

## 2017-05-27 DIAGNOSIS — K219 Gastro-esophageal reflux disease without esophagitis: Secondary | ICD-10-CM | POA: Diagnosis not present

## 2017-05-27 DIAGNOSIS — I10 Essential (primary) hypertension: Secondary | ICD-10-CM | POA: Diagnosis not present

## 2017-05-27 DIAGNOSIS — M109 Gout, unspecified: Secondary | ICD-10-CM | POA: Diagnosis not present

## 2017-05-27 DIAGNOSIS — E039 Hypothyroidism, unspecified: Secondary | ICD-10-CM | POA: Diagnosis not present

## 2017-05-27 DIAGNOSIS — F419 Anxiety disorder, unspecified: Secondary | ICD-10-CM | POA: Diagnosis not present

## 2017-06-29 ENCOUNTER — Encounter: Payer: Self-pay | Admitting: Family

## 2017-07-01 DIAGNOSIS — M4726 Other spondylosis with radiculopathy, lumbar region: Secondary | ICD-10-CM | POA: Diagnosis not present

## 2017-07-01 DIAGNOSIS — Z9889 Other specified postprocedural states: Secondary | ICD-10-CM | POA: Diagnosis not present

## 2017-07-01 DIAGNOSIS — Z681 Body mass index (BMI) 19 or less, adult: Secondary | ICD-10-CM | POA: Diagnosis not present

## 2017-07-01 DIAGNOSIS — M5136 Other intervertebral disc degeneration, lumbar region: Secondary | ICD-10-CM | POA: Diagnosis not present

## 2017-07-01 DIAGNOSIS — M5416 Radiculopathy, lumbar region: Secondary | ICD-10-CM | POA: Diagnosis not present

## 2017-07-03 ENCOUNTER — Ambulatory Visit
Admission: RE | Admit: 2017-07-03 | Discharge: 2017-07-03 | Disposition: A | Discharge status: Home / Self Care | Payer: Medicare Other | Source: Ambulatory Visit | Attending: Internal Medicine | Admitting: Internal Medicine

## 2017-07-03 DIAGNOSIS — Z1231 Encounter for screening mammogram for malignant neoplasm of breast: Secondary | ICD-10-CM

## 2017-07-15 ENCOUNTER — Inpatient Hospital Stay (HOSPITAL_COMMUNITY)
Admission: EM | Admit: 2017-07-15 | Discharge: 2017-07-18 | DRG: 204 | Disposition: A | Discharge status: Home / Self Care | Payer: Medicare Other | Attending: Internal Medicine | Admitting: Internal Medicine

## 2017-07-15 ENCOUNTER — Encounter (HOSPITAL_COMMUNITY): Payer: Self-pay

## 2017-07-15 ENCOUNTER — Emergency Department (HOSPITAL_COMMUNITY): Payer: Medicare Other

## 2017-07-15 ENCOUNTER — Other Ambulatory Visit: Payer: Self-pay

## 2017-07-15 DIAGNOSIS — R042 Hemoptysis: Secondary | ICD-10-CM | POA: Diagnosis not present

## 2017-07-15 DIAGNOSIS — E039 Hypothyroidism, unspecified: Secondary | ICD-10-CM | POA: Diagnosis present

## 2017-07-15 DIAGNOSIS — J479 Bronchiectasis, uncomplicated: Secondary | ICD-10-CM | POA: Diagnosis present

## 2017-07-15 DIAGNOSIS — E440 Moderate protein-calorie malnutrition: Secondary | ICD-10-CM

## 2017-07-15 DIAGNOSIS — D631 Anemia in chronic kidney disease: Secondary | ICD-10-CM | POA: Diagnosis present

## 2017-07-15 DIAGNOSIS — Z681 Body mass index (BMI) 19 or less, adult: Secondary | ICD-10-CM

## 2017-07-15 DIAGNOSIS — N184 Chronic kidney disease, stage 4 (severe): Secondary | ICD-10-CM | POA: Diagnosis present

## 2017-07-15 DIAGNOSIS — J189 Pneumonia, unspecified organism: Secondary | ICD-10-CM | POA: Diagnosis not present

## 2017-07-15 DIAGNOSIS — I129 Hypertensive chronic kidney disease with stage 1 through stage 4 chronic kidney disease, or unspecified chronic kidney disease: Secondary | ICD-10-CM | POA: Diagnosis present

## 2017-07-15 DIAGNOSIS — N183 Chronic kidney disease, stage 3 unspecified: Secondary | ICD-10-CM | POA: Diagnosis present

## 2017-07-15 DIAGNOSIS — Z888 Allergy status to other drugs, medicaments and biological substances status: Secondary | ICD-10-CM

## 2017-07-15 DIAGNOSIS — I10 Essential (primary) hypertension: Secondary | ICD-10-CM | POA: Diagnosis present

## 2017-07-15 DIAGNOSIS — I700 Atherosclerosis of aorta: Secondary | ICD-10-CM | POA: Diagnosis not present

## 2017-07-15 DIAGNOSIS — R0789 Other chest pain: Secondary | ICD-10-CM | POA: Diagnosis not present

## 2017-07-15 DIAGNOSIS — Z79899 Other long term (current) drug therapy: Secondary | ICD-10-CM

## 2017-07-15 DIAGNOSIS — I351 Nonrheumatic aortic (valve) insufficiency: Secondary | ICD-10-CM | POA: Diagnosis present

## 2017-07-15 DIAGNOSIS — Z886 Allergy status to analgesic agent status: Secondary | ICD-10-CM

## 2017-07-15 DIAGNOSIS — I480 Paroxysmal atrial fibrillation: Secondary | ICD-10-CM | POA: Diagnosis not present

## 2017-07-15 DIAGNOSIS — E785 Hyperlipidemia, unspecified: Secondary | ICD-10-CM | POA: Diagnosis present

## 2017-07-15 DIAGNOSIS — I739 Peripheral vascular disease, unspecified: Secondary | ICD-10-CM | POA: Diagnosis present

## 2017-07-15 DIAGNOSIS — E782 Mixed hyperlipidemia: Secondary | ICD-10-CM | POA: Diagnosis present

## 2017-07-15 DIAGNOSIS — E876 Hypokalemia: Secondary | ICD-10-CM | POA: Diagnosis present

## 2017-07-15 DIAGNOSIS — M109 Gout, unspecified: Secondary | ICD-10-CM | POA: Diagnosis present

## 2017-07-15 LAB — CBC WITH DIFFERENTIAL/PLATELET
Basophils Absolute: 0 10*3/uL (ref 0.0–0.1)
Basophils Relative: 1 %
Eosinophils Absolute: 0.2 10*3/uL (ref 0.0–0.7)
Eosinophils Relative: 2 %
HCT: 36.2 % (ref 36.0–46.0)
Hemoglobin: 11.9 g/dL — ABNORMAL LOW (ref 12.0–15.0)
Lymphocytes Relative: 21 %
Lymphs Abs: 1.8 10*3/uL (ref 0.7–4.0)
MCH: 30.4 pg (ref 26.0–34.0)
MCHC: 32.9 g/dL (ref 30.0–36.0)
MCV: 92.3 fL (ref 78.0–100.0)
Monocytes Absolute: 0.9 10*3/uL (ref 0.1–1.0)
Monocytes Relative: 11 %
Neutro Abs: 5.5 10*3/uL (ref 1.7–7.7)
Neutrophils Relative %: 65 %
Platelets: 313 10*3/uL (ref 150–400)
RBC: 3.92 MIL/uL (ref 3.87–5.11)
RDW: 14.1 % (ref 11.5–15.5)
WBC: 8.4 10*3/uL (ref 4.0–10.5)

## 2017-07-15 LAB — COMPREHENSIVE METABOLIC PANEL
ALT: 17 U/L (ref 14–54)
AST: 30 U/L (ref 15–41)
Albumin: 3.7 g/dL (ref 3.5–5.0)
Alkaline Phosphatase: 106 U/L (ref 38–126)
Anion gap: 10 (ref 5–15)
BUN: 28 mg/dL — ABNORMAL HIGH (ref 6–20)
CO2: 26 mmol/L (ref 22–32)
Calcium: 9.9 mg/dL (ref 8.9–10.3)
Chloride: 102 mmol/L (ref 101–111)
Creatinine, Ser: 1.36 mg/dL — ABNORMAL HIGH (ref 0.44–1.00)
GFR calc Af Amer: 42 mL/min — ABNORMAL LOW (ref >60)
GFR calc non Af Amer: 36 mL/min — ABNORMAL LOW (ref >60)
Glucose, Bld: 91 mg/dL (ref 65–99)
Potassium: 3.9 mmol/L (ref 3.5–5.1)
Sodium: 138 mmol/L (ref 135–145)
Total Bilirubin: 0.9 mg/dL (ref 0.3–1.2)
Total Protein: 7.5 g/dL (ref 6.5–8.1)

## 2017-07-15 LAB — ABO/RH: ABO/RH(D): A POS

## 2017-07-15 LAB — I-STAT TROPONIN, ED: Troponin i, poc: 0.01 ng/mL (ref 0.00–0.08)

## 2017-07-15 LAB — TYPE AND SCREEN
ABO/RH(D): A POS
Antibody Screen: NEGATIVE

## 2017-07-15 LAB — PROTIME-INR
INR: 0.97
Prothrombin Time: 12.8 seconds (ref 11.4–15.2)

## 2017-07-15 MED ORDER — ACETAMINOPHEN 650 MG RE SUPP: 650.0000 mg | Freq: Four times a day (QID) | RECTAL | Status: DC | PRN

## 2017-07-15 MED ORDER — DEXTROSE 5 % IV SOLN
1.0000 g | INTRAVENOUS | Status: DC
  Administered 2017-07-16 – 2017-07-17 (×2): 1 g via INTRAVENOUS
  Filled 2017-07-15 (×3): qty 10

## 2017-07-15 MED ORDER — HYDRALAZINE HCL 20 MG/ML IJ SOLN: 10.0000 mg | INTRAMUSCULAR | Status: DC | PRN

## 2017-07-15 MED ORDER — ONDANSETRON HCL 4 MG/2ML IJ SOLN: 4.0000 mg | Freq: Four times a day (QID) | INTRAMUSCULAR | Status: DC | PRN

## 2017-07-15 MED ORDER — ONDANSETRON HCL 4 MG PO TABS: 4.0000 mg | ORAL_TABLET | Freq: Four times a day (QID) | ORAL | Status: DC | PRN

## 2017-07-15 MED ORDER — ACETAMINOPHEN 325 MG PO TABS
650.0000 mg | ORAL_TABLET | Freq: Four times a day (QID) | ORAL | Status: DC | PRN
Start: 2017-07-15 — End: 2017-07-18
  Filled 2017-07-15: qty 2

## 2017-07-15 MED ORDER — LEVOTHYROXINE SODIUM 88 MCG PO TABS
88.0000 ug | ORAL_TABLET | Freq: Every day | ORAL | Status: DC
  Administered 2017-07-16 – 2017-07-18 (×3): 88 ug via ORAL
  Filled 2017-07-15 (×3): qty 1

## 2017-07-15 MED ORDER — METOPROLOL SUCCINATE ER 50 MG PO TB24
50.0000 mg | ORAL_TABLET | Freq: Every day | ORAL | Status: DC
  Administered 2017-07-16 – 2017-07-18 (×3): 50 mg via ORAL
  Filled 2017-07-15 (×3): qty 1

## 2017-07-15 MED ORDER — SODIUM CHLORIDE 0.9 % IV BOLUS (SEPSIS)
500.0000 mL | Freq: Once | INTRAVENOUS | Status: AC
  Administered 2017-07-15: 500 mL via INTRAVENOUS

## 2017-07-15 MED ORDER — BISACODYL 5 MG PO TBEC: 5.0000 mg | DELAYED_RELEASE_TABLET | Freq: Every day | ORAL | Status: DC | PRN

## 2017-07-15 MED ORDER — DEXTROSE 5 % IV SOLN
500.0000 mg | Freq: Once | INTRAVENOUS | Status: AC
  Administered 2017-07-15: 500 mg via INTRAVENOUS
  Filled 2017-07-15: qty 500

## 2017-07-15 MED ORDER — DEXTROSE 5 % IV SOLN
500.0000 mg | INTRAVENOUS | Status: DC
  Administered 2017-07-16 – 2017-07-17 (×2): 500 mg via INTRAVENOUS
  Filled 2017-07-15 (×3): qty 500

## 2017-07-15 MED ORDER — LORAZEPAM 0.5 MG PO TABS
0.5000 mg | ORAL_TABLET | Freq: Every day | ORAL | Status: DC
  Administered 2017-07-16 – 2017-07-17 (×3): 0.5 mg via ORAL
  Filled 2017-07-15 (×3): qty 1

## 2017-07-15 MED ORDER — DEXTROSE 5 % IV SOLN
1.0000 g | Freq: Once | INTRAVENOUS | Status: AC
  Administered 2017-07-15: 1 g via INTRAVENOUS
  Filled 2017-07-15: qty 10

## 2017-07-15 MED ORDER — DICLOFENAC SODIUM 1 % TD GEL: 1.0000 "application " | TRANSDERMAL | Status: DC | PRN

## 2017-07-15 MED ORDER — PANTOPRAZOLE SODIUM 40 MG PO TBEC
40.0000 mg | DELAYED_RELEASE_TABLET | Freq: Every day | ORAL | Status: DC
  Administered 2017-07-16 – 2017-07-18 (×3): 40 mg via ORAL
  Filled 2017-07-15 (×3): qty 1

## 2017-07-15 MED ORDER — IOPAMIDOL (ISOVUE-370) INJECTION 76%
INTRAVENOUS | Status: AC
  Administered 2017-07-15: 100 mL
  Filled 2017-07-15: qty 100

## 2017-07-15 MED ORDER — TRAMADOL HCL 50 MG PO TABS
25.0000 mg | ORAL_TABLET | Freq: Every evening | ORAL | Status: DC | PRN
  Administered 2017-07-16 (×2): 50 mg via ORAL
  Filled 2017-07-15 (×3): qty 1

## 2017-07-15 NOTE — Progress Notes (Signed)
Pharmacy Antibiotic Note  Deborah Shields is a 77 y.o. female admitted on 07/15/2017 with CAP.  Pharmacy has been consulted for Rocephin and azithromycin dosing.  Plan: Rocephin 1 gm IV q24 Azithromycin 500 mg IV q24 Pharmacy to sign off  Height: 5\' 5"  (165.1 cm) Weight: 104 lb (47.2 kg) IBW/kg (Calculated) : 57  Temp (24hrs), Avg:97.9 F (36.6 C), Min:97.9 F (36.6 C), Max:97.9 F (36.6 C)  Recent Labs  Lab 07/15/17 1736  WBC 8.4  CREATININE 1.36*    Estimated Creatinine Clearance: 25.4 mL/min (A) (by C-G formula based on SCr of 1.36 mg/dL (H)).    Allergies  Allergen Reactions  . Aspirin Nausea Only and Other (See Comments)    REACTION: GI upset  ( pt can take 81 mg but NOT   325 mg ASA )  . Lovastatin Nausea Only and Other (See Comments)    REACTION: nausea  . Benazepril Hcl Other (See Comments)    Unknown  . Paroxetine Other (See Comments)    Unknown  . Bupropion Hcl Other (See Comments)    REACTION: tinnitis  . Ezetimibe Nausea And Vomiting and Other (See Comments)    REACTION: GI symptoms  . Fenofibrate Other (See Comments)    Myalgia   . Pravastatin Sodium Other (See Comments)    REACTION: elevated CPK - Muscle's in bilateral Leg    Thank you for allowing pharmacy to be a part of this patient's care.  Eudelia Bunch, Pharm.D. [phone] 07/15/2017 8:42 PM

## 2017-07-15 NOTE — ED Provider Notes (Signed)
Emergency Department Provider Note   I have reviewed the triage vital signs and the nursing notes.   HISTORY  Chief Complaint Hemoptysis   HPI Deborah Shields is a 77 y.o. female with PMH of CKD, aortic insufficiency, PAF, HTN, and Hypothyroidism presents to the emergency department for evaluation of 3 episodes of large-volume hemoptysis.  Patient woke up this morning with a strong urge to cough.  She had a long episode of deep coughing which produced a large volume of dark red blood and clot.  She spit that into a towel and felt slightly better.  She went about her day but several hours later had another episode of similar coughing with passage of clot.  She had a third episode early this afternoon.  Since the third episode she is developed some mild to moderate chest discomfort but denies dyspnea.  No fevers or chills.  Prior to symptom onset this morning she was in her typical state of health with no bronchitis or pneumonia symptoms.  No history of DVT/PE.  She is on a 81 mg aspirin daily but no other antiplatelet or anticoagulant medications.  No radiation of symptoms or modifying factors.   Past Medical History:  Diagnosis Date  . Anxiety   . Aortic insufficiency    mild due to degenerative changes  . Arthritis   . Basal cell carcinoma 2007   [facility] Left leg  . Carotid artery occlusion   . Chronic kidney disease    CRD Stage 3  . Conjunctivitis due to adenovirus, both eyes   . Degenerative joint disease   . Diverticulosis of colon   . Dyspnea    with exertion  . Heart murmur   . Hyperlipidemia    NMR 07/2009: LDL 200 (2260/1233)TG 99, HDL 65. LDL goal =<120, ideally <100. father MI @ 51  . Hypertension   . Hypothyroidism    Dr Wilson Singer  . Microscopic hematuria   . Peripheral neuropathy    compressive in UE bilaterally; Dr Daylene Katayama  . Peripheral vascular disease ([place])    ICA bilat, Dr.Charles fields, [facility]  . Pneumonia    2017  . PONV (postoperative nausea and  vomiting)    "Inner ear, does  okay with Scopolamine"  . Rectocele   . [place] spotted fever     Patient Active Problem List   Diagnosis Date Noted  . HNP (herniated nucleus pulposus), lumbar 03/20/2017  . Edema of both feet 12/10/2016  . Tick bite 11/21/2015  . Abnormal urine odor 11/21/2015  . Shortness of breath 11/21/2015  . Fever, unspecified 11/21/2015  . Other fatigue 11/21/2015  . Anxiety 08/15/2015  . Encounter for pre-operative cardiovascular clearance 07/10/2015  . Pre-operative clearance 07/07/2015  . Rotator cuff tear 07/07/2015  . Rectocele, female 08/04/2014  . Dyspnea on exertion 02/14/2014  . Other malaise and fatigue 12/31/2013  . PAF (paroxysmal atrial fibrillation) ([place]) 01/10/2013  . Thrombosed external hemorrhoid 11/26/2012  . Chronic renal insufficiency, stage III (moderate) ([place]) 09/03/2011  . Atherosclerosis of native arteries of the extremities with intermittent claudication 04/04/2011  . Occlusion and stenosis of carotid artery without mention of cerebral infarction 04/04/2011  . PERIPHERAL NEUROPATHY 08/15/2008  . Hypothyroidism 08/12/2007  . HYPERLIPIDEMIA 08/12/2007  . Essential hypertension 08/12/2007  . Osteopenia 08/12/2007  . Carotid artery stenosis, asymptomatic 08/11/2007  . Osteoarthritis 08/11/2007  . BASAL CELL CARCINOMA, HX OF 08/11/2007  . FIBROCYSTIC BREAST DISEASE 11/18/2006  . DIVERTICULOSIS, COLON 08/10/2006    Past  Surgical History:  Procedure Laterality Date  . ABDOMINAL HYSTERECTOMY  1973   age 50 due to dysfuctional menses; HRT x 25-30 years  . APPENDECTOMY  1952  . basal cell cancer  03/2006   leg  . BILATERAL OOPHORECTOMY  1990   prophylactically (sister had ovarian ca)  . CARPAL TUNNEL RELEASE Bilateral 1989   right  . CATARACT EXTRACTION, BILATERAL  2010   Dr Kathrin Penner  . CHOLECYSTECTOMY  2006  . COLONOSCOPY     Dr Ardis Hughs  . EYE SURGERY Left    Bilateral eye (film removed lt eye 01/09/17)  . LUMBAR  LAMINECTOMY/DECOMPRESSION MICRODISCECTOMY Left 03/20/2017   Procedure: LEFT LUMBAR FOURLUMBAR FIVE LAMINOTOMY AND MICROICRODISCECTOMY 1 LEVEL;  Surgeon: Jovita Gamma, MD;  Location: [facility];  Service: Neurosurgery;  Laterality: Left;  LEFT  . NM MYOVIEW LTD  06/13/2008   low risk scan  . ORTHOPEDIC SURGERY  1989/200/2002/2012   elbows,shoulder surgery x 3, right hand  . RECTOCELE REPAIR  2016  . TONSILLECTOMY  1957  . US ECHOCARDIOGRAPHY  05/01/2010   trace MR,AI,TR;EF =>55%    Current Outpatient Rx  . Order #: [id number] Class: Historical Med  . Order #: [id number] Class: Historical Med  . Order #: [id number] Class: Historical Med  . Order #: [id number] Class: Historical Med  . Order #: [id number] Class: Print  . Order #: [id number] Class: Print  . Order #: [id number] Class: Fill Later  . Order #: [id number] Class: Historical Med  . Order #: [id number] Class: Historical Med  . Order #: [id number] Class: Print  . Order #: [id number] Class: Historical Med  . Order #: [id number] Class: Historical Med  . Order #: [id number] Class: Historical Med  . Order #: [id number] Class: Historical Med  . Order #: [id number] Class: Print  . Order #: [id number] Class: Historical Med  . Order #: [id number] Class: Historical Med    Allergies Aspirin; Lovastatin; Benazepril hcl; Paroxetine; Bupropion hcl; Ezetimibe; Fenofibrate; and Pravastatin sodium  Family History  Problem Relation Age of Onset  . Heart attack Father 24  . Heart disease Father        Before age 79  . Colon cancer Mother 39  . Stroke Mother 49  . Kidney disease Mother        ? renal calculi; S/P resecton of kidney  . Cancer - Colon Mother 49  . Ovarian cancer Sister   . Other Sister        Valve replacement (aortic & mitral ) in 2 sisters  . Heart disease Sister        Before age 72  . Heart disease Brother        aortic & mitral valve replacement in 2 bro; both had CBAG  . Diabetes Neg Hx   . Breast cancer Neg Hx     Social History Social History     Tobacco Use  . Smoking status: Never Smoker  . Smokeless tobacco: Never Used  Substance Use Topics  . Alcohol use: No    Alcohol/week: 0.0 oz  . Drug use: No    Review of Systems  Constitutional: No fever/chills Eyes: No visual changes. ENT: No sore throat. Cardiovascular: Positive chest pain. Respiratory: Denies shortness of breath. Positive cough with hemoptysis.  Gastrointestinal: No abdominal pain.  No nausea, no vomiting.  No diarrhea.  No constipation. Genitourinary: Negative for dysuria. Musculoskeletal: Negative for back pain. Skin: Negative for rash. Neurological: Negative for headaches, focal weakness or numbness.  10-point ROS otherwise negative.  ____________________________________________   PHYSICAL EXAM:  VITAL SIGNS: ED Triage Vitals  Enc  Vitals Group     BP 07/15/17 1551 (!) 147/90     Pulse Rate 07/15/17 1551 79     Resp 07/15/17 1551 18     Temp 07/15/17 1551 97.9 F (36.6 C)     Temp Source 07/15/17 1551 Oral     SpO2 07/15/17 1551 98 %     Weight 07/15/17 1557 104 lb (47.2 kg)     Height 07/15/17 1557 5\' 5"  (1.651 m)     Pain Score 07/15/17 1557 0    Constitutional: Alert and oriented. Well appearing and in no acute distress. Eyes: Conjunctivae are normal.  Head: Atraumatic. Nose: No congestion/rhinnorhea. Mouth/Throat: Mucous membranes are moist.  Neck: No stridor.  Cardiovascular: Normal rate, regular rhythm. Good peripheral circulation. Grossly normal heart sounds.   Respiratory: Normal respiratory effort.  No retractions. Lungs CTAB. Gastrointestinal: No distention.  Musculoskeletal: No lower extremity tenderness nor edema. No gross deformities of extremities. Neurologic:  Normal speech and language. No gross focal neurologic deficits are appreciated.  Skin:  Skin is warm, dry and intact. No rash noted.  ____________________________________________   LABS (all labs ordered are listed, but only abnormal results are  displayed)  Labs Reviewed  COMPREHENSIVE METABOLIC PANEL - Abnormal; Notable for the following components:      Result Value   BUN 28 (*)    Creatinine, Ser 1.36 (*)    GFR calc non Af Amer 36 (*)    GFR calc Af Amer 42 (*)    All other components within normal limits  CBC WITH DIFFERENTIAL/PLATELET - Abnormal; Notable for the following components:   Hemoglobin 11.9 (*)    All other components within normal limits  PROTIME-INR  I-STAT TROPONIN, ED  TYPE AND SCREEN  ABO/RH   ____________________________________________  EKG   EKG Interpretation  Date/Time:  Tuesday July 15 2017 17:40:42 EST Ventricular Rate:  75 PR Interval:    QRS Duration: 91 QT Interval:  403 QTC Calculation: 451 R Axis:   49 Text Interpretation:  Sinus rhythm Probable left atrial enlargement No STEMI.  Confirmed by Nanda Quinton [phone]) on 07/15/2017 5:46:12 PM       ____________________________________________  RADIOLOGY  Dg Chest 2 View  Result Date: 07/15/2017 CLINICAL DATA:  Hemoptysis EXAM: CHEST  2 VIEW COMPARISON:  05/13/2016, 04/27/2015 FINDINGS: Hyperinflation. No focal pulmonary infiltrate, consolidation, or pleural effusion. Cardiomediastinal silhouette within normal limits. Aortic atherosclerosis. No pneumothorax. Surgical clips in the right upper quadrant IMPRESSION: No active cardiopulmonary disease. Electronically Signed   By: Donavan Foil M.D.   On: 07/15/2017 17:59   Ct Angio Chest Pe W And/or Wo Contrast  Result Date: 07/15/2017 CLINICAL DATA:  77 year old with hemoptysis since this morning. Concern for pulmonary embolism. EXAM: CT ANGIOGRAPHY CHEST WITH CONTRAST TECHNIQUE: Multidetector CT imaging of the chest was performed using the standard protocol during bolus administration of intravenous contrast. Multiplanar CT image reconstructions and MIPs were obtained to evaluate the vascular anatomy. CONTRAST:  181mL ISOVUE-370 IOPAMIDOL (ISOVUE-370) INJECTION 76% COMPARISON:  Chest  radiograph 07/15/2016 FINDINGS: Cardiovascular: Negative for pulmonary embolism. Negative for an aortic dissection. Great vessels are patent. Atherosclerotic calcifications in the thoracic aorta. Mild narrowing in the proximal left subclavian artery due to atherosclerotic disease. No significant pericardial fluid. Mediastinum/Nodes: No evidence for mediastinal, hilar or axillary lymphadenopathy. Proximal and mid esophagus is dilated with gas. Lungs/Pleura: The trachea and mainstem bronchi are patent. There is extensive scarring at both lung apices with air bronchograms along the medial right upper lobe. Patchy parenchymal  and small nodular densities in both lungs suggesting inflammatory changes of uncertain age. There are endobronchial densities involving the airways of the lingula. Endobronchial densities could be related to hemoptysis. There is endobronchial material at the origin of the left upper lobe bronchus. No large pleural effusions. Upper Abdomen: Low-density structure in the left kidney upper pole is most compatible with a cyst. No acute abnormality in upper abdomen. Musculoskeletal: Prominent calcifications along the posterior vertebral bodies and disc at T6-T7. Marked disc space disease and endplate changes at T8-U8. Review of the MIP images confirms the above findings. IMPRESSION: Negative for a pulmonary embolism. Patchy parenchymal disease and small nodules. Findings are suggestive for an infectious or inflammatory process of uncertain age. There are endobronchial densities in the lingula and near the origin of the left upper lobe bronchus. This may be accounting for patient's hemoptysis. Extensive scarring at both lung apices. Mild dilatation of the esophagus. Aortic Atherosclerosis (ICD10-I70.0). Electronically Signed   By: Markus Daft M.D.   On: 07/15/2017 20:09    ____________________________________________   PROCEDURES  Procedure(s) performed:    Procedures  None ____________________________________________   INITIAL IMPRESSION / ASSESSMENT AND PLAN / ED COURSE  Pertinent labs & imaging results that were available during my care of the patient were reviewed by me and considered in my medical decision making (see chart for details).  Presents to the emergency department for evaluation of a cough with passage of large volume blood clot.  The patient collected the paper towels showing her hemoptysis brought with her to the emergency department.  In review the hemoptysis seems to be fairly large volume and occurring in 3 separate coughing episodes.  No significant risk factors for DVT/PE.  Patient with no symptoms.  Plan for plain film of the chest and labs followed likely by CT if her creatinine will tolerate it as she has known CKD.   08:45 PM Spoke with critical care who will place her on list for consultation. State that it may be easier to have the patient at [facility]. Will discuss with hospitalist.   09:00 PM Discussed patient's case with Hospitlaist, Dr. Hal Hope to request admission. Patient and family (if present) updated with plan. Care transferred to Hospitalist service.  I reviewed all nursing notes, vitals, pertinent old records, EKGs, labs, imaging (as available).  ____________________________________________  FINAL CLINICAL IMPRESSION(S) / ED DIAGNOSES  Final diagnoses:  Hemoptysis     MEDICATIONS GIVEN DURING THIS VISIT:  Medications  cefTRIAXone (ROCEPHIN) 1 g in dextrose 5 % 50 mL IVPB (1 g Intravenous New Bag/Given 07/15/17 2100)  azithromycin (ZITHROMAX) 500 mg in dextrose 5 % 250 mL IVPB (not administered)  cefTRIAXone (ROCEPHIN) 1 g in dextrose 5 % 50 mL IVPB (not administered)  azithromycin (ZITHROMAX) 500 mg in dextrose 5 % 250 mL IVPB (not administered)  sodium chloride 0.9 % bolus 500 mL (0 mLs Intravenous Stopped 07/15/17 1813)  iopamidol (ISOVUE-370) 76 % injection (100 mLs  Contrast Given 07/15/17  1937)    Note:  This document was prepared using Dragon voice recognition software and may include unintentional dictation errors.  Nanda Quinton, MD Emergency Medicine    Long, [facility], MD 07/15/17 2107

## 2017-07-15 NOTE — H&P (Signed)
History and Physical    [name] OVF:[id number] DOB: 1940/07/22 DOA: 07/15/2017  PCP: Jani Gravel, MD  Patient coming from: Home.  Chief Complaint: Coughing up blood.  HPI: Deborah Shields is a 77 y.o. female with history of hypertension, paroxysmal atrial fibrillation, chronic kidney disease, chronic anemia and hypothyroidism presents to the ER because of hemoptysis.  Patient states since morning patient has been having cough which was productive of blood.  Had multiple episodes.  Denies any fever chills or shortness of breath or chest pain.  Patient takes aspirin otherwise not on any other blood thinners.  Patient states he has at least had 40 pound weight loss over the last 1 year.  Last month patient's Uloric was discontinued after patient had possible angioedema.  Patient also is complaining of left great toe pain which happened since this morning.  ED Course: In the ER patient had CT angiogram of the chest which shows patchy parenchymal involvement concerning for infectious versus inflammatory process.  There also was some endobronchial densities around the lingula and left bronchus which may be accounting for patient's hemoptysis.  Pulmonary critical care was consulted and they at this time have requested patient to be on antibiotics for pneumonia and they will be seeing patient in consult.  At the time of my exam patient is not in distress.  Review of Systems: As per HPI, rest all negative.   Past Medical History:  Diagnosis Date  . Anxiety   . Aortic insufficiency    mild due to degenerative changes  . Arthritis   . Basal cell carcinoma 2007   [place] [facility] Left leg  . Carotid artery occlusion   . Chronic kidney disease    CRD Stage 3  . Conjunctivitis due to adenovirus, both eyes   . Degenerative joint disease   . Diverticulosis of colon   . Dyspnea    with exertion  . Heart murmur   . Hyperlipidemia    NMR 07/2009: LDL 200 (2260/1233)TG 99, HDL 65. LDL goal =<120,  ideally <100. father MI @ 80  . Hypertension   . Hypothyroidism    Dr Wilson Singer  . Microscopic hematuria   . Peripheral neuropathy    compressive in UE bilaterally; Dr Daylene Katayama  . Peripheral vascular disease ([place])    ICA bilat, Dr.Charles fields, [facility]  . Pneumonia    2017  . PONV (postoperative nausea and vomiting)    "Inner ear, does  okay with Scopolamine"  . Rectocele   . [place] spotted fever     Past Surgical History:  Procedure Laterality Date  . ABDOMINAL HYSTERECTOMY  1973   age 43 due to dysfuctional menses; HRT x 25-30 years  . APPENDECTOMY  1952  . basal cell cancer  03/2006   leg  . BILATERAL OOPHORECTOMY  1990   prophylactically (sister had ovarian ca)  . CARPAL TUNNEL RELEASE Bilateral 1989   right  . CATARACT EXTRACTION, BILATERAL  2010   Dr Kathrin Penner  . CHOLECYSTECTOMY  2006  . COLONOSCOPY     Dr Ardis Hughs  . EYE SURGERY Left    Bilateral eye (film removed lt eye 01/09/17)  . LUMBAR LAMINECTOMY/DECOMPRESSION MICRODISCECTOMY Left 03/20/2017   Procedure: LEFT LUMBAR FOURLUMBAR FIVE LAMINOTOMY AND MICROICRODISCECTOMY 1 LEVEL;  Surgeon: Jovita Gamma, MD;  Location: [place];  Service: Neurosurgery;  Laterality: Left;  LEFT  . NM MYOVIEW LTD  06/13/2008   low risk scan  . ORTHOPEDIC SURGERY  1989/200/2002/2012   elbows,shoulder  surgery x 3, right hand  . RECTOCELE REPAIR  2016  . TONSILLECTOMY  1957  . US ECHOCARDIOGRAPHY  05/01/2010   trace MR,AI,TR;EF =>55%     reports that  has never smoked. she has never used smokeless tobacco. She reports that she does not drink alcohol or use drugs.  Allergies  Allergen Reactions  . Aspirin Nausea Only and Other (See Comments)    REACTION: GI upset  ( pt can take 81 mg but NOT   325 mg ASA )  . Lovastatin Nausea Only and Other (See Comments)    REACTION: nausea  . Benazepril Hcl Other (See Comments)    Unknown  . Paroxetine Other (See Comments)    Unknown  . Bupropion Hcl Other (See Comments)    REACTION:  tinnitis  . Ezetimibe Nausea And Vomiting and Other (See Comments)    REACTION: GI symptoms  . Fenofibrate Other (See Comments)    Myalgia   . Pravastatin Sodium Other (See Comments)    REACTION: elevated CPK - Muscle's in bilateral Leg    Family History  Problem Relation Age of Onset  . Heart attack Father 62  . Heart disease Father        Before age 78  . Colon cancer Mother 3  . Stroke Mother 49  . Kidney disease Mother        ? renal calculi; S/P resecton of kidney  . Cancer - Colon Mother 39  . Ovarian cancer Sister   . Other Sister        Valve replacement (aortic & mitral ) in 2 sisters  . Heart disease Sister        Before age 67  . Heart disease Brother        aortic & mitral valve replacement in 2 bro; both had CBAG  . Diabetes Neg Hx   . Breast cancer Neg Hx     Prior to Admission medications   Medication Sig Start Date End Date Taking? Authorizing Provider  aspirin 81 MG tablet Take 81 mg by mouth at bedtime.    Yes [provider]  b complex vitamins tablet Take 1 tablet by mouth daily after supper.    Yes [provider]  FIBER PO Take 1 tablet by mouth daily.   Yes [provider]  levothyroxine (SYNTHROID) 88 MCG tablet Take 88 mcg by mouth daily before breakfast. Taking the brand "synthroid"   Yes [provider]  LORazepam (ATIVAN) 0.5 MG tablet Take 1 tablet (0.5 mg total) by mouth 3 (three) times daily as needed. Patient taking differently: Take 0.5 mg by mouth at bedtime.  10/17/15  Yes Burns, Claudina Lick, MD  meclizine (ANTIVERT) 25 MG tablet Take 1 tablet (25 mg total) by mouth daily. 1/2 every 8 hrs prn for imbalance Patient taking differently: Take 12.5 mg by mouth at bedtime.  11/01/10  Yes Hendricks Limes, MD  metoprolol tartrate (LOPRESSOR) 25 MG tablet Take 1 tablet (25 mg total) by mouth 2 (two) times daily. 11/29/16  Yes Almyra Deforest, PA  Multiple Vitamins-Minerals (CENTRUM SILVER PO) Take 1 tablet by mouth daily  after supper.    Yes [provider]  pantoprazole (PROTONIX) 40 MG tablet Take 40 mg by mouth daily.  05/27/17  Yes [provider]  traMADol (ULTRAM) 50 MG tablet 1/2-1 by mouth once daily as needed. Patient taking differently: Take 25-50 mg by mouth at bedtime as needed for moderate pain or severe pain.  10/17/15  Yes Burns, Claudina Lick, MD  bisacodyl (DULCOLAX) 5 MG EC tablet Take 5 mg by mouth daily as needed for moderate constipation.    [provider]  cyclobenzaprine (FLEXERIL) 10 MG tablet Take 10 mg by mouth daily as needed for muscle spasms.    [provider]  diclofenac sodium (VOLTAREN) 1 % GEL Apply 1 application topically as needed (for pain).    [provider]  febuxostat (ULORIC) 40 MG tablet Take 40 mg by mouth at bedtime.    [provider]  HYDROcodone-acetaminophen (NORCO/VICODIN) 5-325 MG tablet Take 1 tablet by mouth every 4 (four) hours as needed (pain). Patient not taking: Reported on 07/15/2017 03/21/17   Jovita Gamma, MD  metoprolol succinate (TOPROL-XL) 50 MG 24 hr tablet Take 50 mg by mouth daily.  05/27/17   [provider]  psyllium (REGULOID) 0.52 g capsule Take 0.52 g by mouth daily. FIBER    [provider]    Physical Exam: Vitals:   07/15/17 1849 07/15/17 1900 07/15/17 1922 07/15/17 2131  BP: (!) 162/88 (!) 179/92 (!) 179/92 (!) 180/84  Pulse: 72 75 74 76  Resp: 15 14 18 16   Temp:      TempSrc:      SpO2: 100% 100% 99% 100%  Weight:      Height:          Constitutional: Moderately built and nourished. Vitals:   07/15/17 1849 07/15/17 1900 07/15/17 1922 07/15/17 2131  BP: (!) 162/88 (!) 179/92 (!) 179/92 (!) 180/84  Pulse: 72 75 74 76  Resp: 15 14 18 16   Temp:      TempSrc:      SpO2: 100% 100% 99% 100%  Weight:      Height:       Eyes: Anicteric no pallor. ENMT: No discharge from the ears eyes nose or mouth. Neck: No mass felt.  No neck rigidity. Respiratory: No  rhonchi or crepitations. Cardiovascular: S1-S2 heard no murmurs appreciated. Abdomen: Soft nontender bowel sounds present. Musculoskeletal: Left great toe appears erythematous but no swelling. Skin: Left great toe appears erythematous but no swelling. Neurologic: Alert awake oriented to time place and person.  Moves all extremities. Psychiatric: Appears normal.  Normal affect.   Labs on Admission: I have personally reviewed following labs and imaging studies  CBC: Recent Labs  Lab 07/15/17 1736  WBC 8.4  NEUTROABS 5.5  HGB 11.9*  HCT 36.2  MCV 92.3  PLT 096   Basic Metabolic Panel: Recent Labs  Lab 07/15/17 1736  NA 138  K 3.9  CL 102  CO2 26  GLUCOSE 91  BUN 28*  CREATININE 1.36*  CALCIUM 9.9   GFR: Estimated Creatinine Clearance: 25.4 mL/min (A) (by C-G formula based on SCr of 1.36 mg/dL (H)). Liver Function Tests: Recent Labs  Lab 07/15/17 1736  AST 30  ALT 17  ALKPHOS 106  BILITOT 0.9  PROT 7.5  ALBUMIN 3.7   No results for input(s): LIPASE, AMYLASE in the last 168 hours. No results for input(s): AMMONIA in the last 168 hours. Coagulation Profile: Recent Labs  Lab 07/15/17 1736  INR 0.97   Cardiac Enzymes: No results for input(s): CKTOTAL, CKMB, CKMBINDEX, TROPONINI in the last 168 hours. BNP (last 3 results) No results for input(s): PROBNP in the last 8760 hours. HbA1C: No results for input(s): HGBA1C in the last 72 hours. CBG: No results for input(s): GLUCAP in the last 168 hours. Lipid Profile: No results for input(s): CHOL,  HDL, LDLCALC, TRIG, CHOLHDL, LDLDIRECT in the last 72 hours. Thyroid Function Tests: No results for input(s): TSH, T4TOTAL, FREET4, T3FREE, THYROIDAB in the last 72 hours. Anemia Panel: No results for input(s): VITAMINB12, FOLATE, FERRITIN, TIBC, IRON, RETICCTPCT in the last 72 hours. Urine analysis:    Component Value Date/Time   COLORURINE YELLOW 11/21/2015 [place] 11/21/2015 1156   LABSPEC  <=1.005 (A) 11/21/2015 1156   PHURINE 6.5 11/21/2015 1156   GLUCOSEU NEGATIVE 11/21/2015 1156   HGBUR NEGATIVE 11/21/2015 [place] 11/21/2015 1156   BILIRUBINUR Neg 09/07/2012 1426   KETONESUR NEGATIVE 11/21/2015 1156   PROTEINUR NEGATIVE 06/02/2014 1515   UROBILINOGEN 0.2 11/21/2015 1156   NITRITE NEGATIVE 11/21/2015 1156   LEUKOCYTESUR NEGATIVE 11/21/2015 1156   Sepsis Labs: @LABRCNTIP (procalcitonin:4,lacticidven:4) )No results found for this or any previous visit (from the past 240 hour(s)).   Radiological Exams on Admission: Dg Chest 2 View  Result Date: 07/15/2017 CLINICAL DATA:  Hemoptysis EXAM: CHEST  2 VIEW COMPARISON:  05/13/2016, 04/27/2015 FINDINGS: Hyperinflation. No focal pulmonary infiltrate, consolidation, or pleural effusion. Cardiomediastinal silhouette within normal limits. Aortic atherosclerosis. No pneumothorax. Surgical clips in the right upper quadrant IMPRESSION: No active cardiopulmonary disease. Electronically Signed   By: Donavan Foil M.D.   On: 07/15/2017 17:59   Ct Angio Chest Pe W And/or Wo Contrast  Result Date: 07/15/2017 CLINICAL DATA:  77 year old with hemoptysis since this morning. Concern for pulmonary embolism. EXAM: CT ANGIOGRAPHY CHEST WITH CONTRAST TECHNIQUE: Multidetector CT imaging of the chest was performed using the standard protocol during bolus administration of intravenous contrast. Multiplanar CT image reconstructions and MIPs were obtained to evaluate the vascular anatomy. CONTRAST:  178mL ISOVUE-370 IOPAMIDOL (ISOVUE-370) INJECTION 76% COMPARISON:  Chest radiograph 07/15/2016 FINDINGS: Cardiovascular: Negative for pulmonary embolism. Negative for an aortic dissection. Great vessels are patent. Atherosclerotic calcifications in the thoracic aorta. Mild narrowing in the proximal left subclavian artery due to atherosclerotic disease. No significant pericardial fluid. Mediastinum/Nodes: No evidence for mediastinal, hilar or  axillary lymphadenopathy. Proximal and mid esophagus is dilated with gas. Lungs/Pleura: The trachea and mainstem bronchi are patent. There is extensive scarring at both lung apices with air bronchograms along the medial right upper lobe. Patchy parenchymal and small nodular densities in both lungs suggesting inflammatory changes of uncertain age. There are endobronchial densities involving the airways of the lingula. Endobronchial densities could be related to hemoptysis. There is endobronchial material at the origin of the left upper lobe bronchus. No large pleural effusions. Upper Abdomen: Low-density structure in the left kidney upper pole is most compatible with a cyst. No acute abnormality in upper abdomen. Musculoskeletal: Prominent calcifications along the posterior vertebral bodies and disc at T6-T7. Marked disc space disease and endplate changes at N8-G9. Review of the MIP images confirms the above findings. IMPRESSION: Negative for a pulmonary embolism. Patchy parenchymal disease and small nodules. Findings are suggestive for an infectious or inflammatory process of uncertain age. There are endobronchial densities in the lingula and near the origin of the left upper lobe bronchus. This may be accounting for patient's hemoptysis. Extensive scarring at both lung apices. Mild dilatation of the esophagus. Aortic Atherosclerosis (ICD10-I70.0). Electronically Signed   By: Markus Daft M.D.   On: 07/15/2017 20:09     Assessment/Plan Principal Problem:   Hemoptysis Active Problems:   Hypothyroidism   HYPERLIPIDEMIA   Essential hypertension   Chronic renal insufficiency, stage III (moderate) (HCC)   PAF (paroxysmal atrial fibrillation) ([place])    1.  Hemoptysis -pulmonary critical care has been consulted.  For now patient will be placed on ceftriaxone and Zithromax for community-acquired pneumonia.  Check urine for Legionella and strep antigen and sputum cultures.  Since patient has had at least 40 pounds  of weight loss which is concerning we will follow pulmonary recommendations.  Patient denies any fever chills or any contact with patients with tuberculosis.  Has not travel outside [place]. 2. Left great toe pain concerning for gout -will check uric acid levels which if elevated will keep patient on Solu-Medrol.  Patient had angioedema from Uloric last month. 3. Anemia likely from chronic kidney disease however patient has hemoptysis so follow CBC closely. 4. Chronic kidney disease stage III -creatinine appears to be at baseline. 5. Hypertension on Toprol-XL. 6. Hypothyroidism on Synthroid. 7. History of paroxysmal atrial fibrillation only one episode as per the cardiology note and has not been on anticoagulation.  Patient is on aspirin which will be discontinued at this time due to hemoptysis.   DVT prophylaxis: SCDs. Code Status: Full code. Family Communication: Patient's daughter. Disposition Plan: Home. Consults called: Pulmonary critical care. Admission status: Observation.   Rise Patience MD [facility] Pager [phone].  If 7PM-7AM, please contact night-coverage [email or web address] Password [facility]  07/15/2017, 10:31 PM

## 2017-07-15 NOTE — ED Notes (Signed)
Patient may eat and drink per EDP

## 2017-07-15 NOTE — ED Triage Notes (Signed)
Patient states she began coughing today and has had large amounts of bright red blood in her sputum 3-4 times today. Patient states she "feels like the blood builds up in her throat and then she starts coughing and the blood occurs."

## 2017-07-16 DIAGNOSIS — Z79899 Other long term (current) drug therapy: Secondary | ICD-10-CM | POA: Diagnosis not present

## 2017-07-16 DIAGNOSIS — I351 Nonrheumatic aortic (valve) insufficiency: Secondary | ICD-10-CM | POA: Diagnosis present

## 2017-07-16 DIAGNOSIS — R042 Hemoptysis: Principal | ICD-10-CM

## 2017-07-16 DIAGNOSIS — E876 Hypokalemia: Secondary | ICD-10-CM | POA: Diagnosis present

## 2017-07-16 DIAGNOSIS — J209 Acute bronchitis, unspecified: Secondary | ICD-10-CM | POA: Diagnosis not present

## 2017-07-16 DIAGNOSIS — Z888 Allergy status to other drugs, medicaments and biological substances status: Secondary | ICD-10-CM | POA: Diagnosis not present

## 2017-07-16 DIAGNOSIS — E440 Moderate protein-calorie malnutrition: Secondary | ICD-10-CM

## 2017-07-16 DIAGNOSIS — I129 Hypertensive chronic kidney disease with stage 1 through stage 4 chronic kidney disease, or unspecified chronic kidney disease: Secondary | ICD-10-CM | POA: Diagnosis present

## 2017-07-16 DIAGNOSIS — E785 Hyperlipidemia, unspecified: Secondary | ICD-10-CM | POA: Diagnosis present

## 2017-07-16 DIAGNOSIS — N183 Chronic kidney disease, stage 3 (moderate): Secondary | ICD-10-CM | POA: Diagnosis present

## 2017-07-16 DIAGNOSIS — D631 Anemia in chronic kidney disease: Secondary | ICD-10-CM | POA: Diagnosis present

## 2017-07-16 DIAGNOSIS — E039 Hypothyroidism, unspecified: Secondary | ICD-10-CM | POA: Diagnosis present

## 2017-07-16 DIAGNOSIS — J479 Bronchiectasis, uncomplicated: Secondary | ICD-10-CM | POA: Diagnosis present

## 2017-07-16 DIAGNOSIS — I480 Paroxysmal atrial fibrillation: Secondary | ICD-10-CM | POA: Diagnosis present

## 2017-07-16 DIAGNOSIS — I739 Peripheral vascular disease, unspecified: Secondary | ICD-10-CM | POA: Diagnosis present

## 2017-07-16 DIAGNOSIS — M109 Gout, unspecified: Secondary | ICD-10-CM | POA: Diagnosis present

## 2017-07-16 DIAGNOSIS — J189 Pneumonia, unspecified organism: Secondary | ICD-10-CM | POA: Diagnosis present

## 2017-07-16 DIAGNOSIS — J42 Unspecified chronic bronchitis: Secondary | ICD-10-CM | POA: Diagnosis not present

## 2017-07-16 DIAGNOSIS — R848 Other abnormal findings in specimens from respiratory organs and thorax: Secondary | ICD-10-CM | POA: Diagnosis not present

## 2017-07-16 DIAGNOSIS — Z886 Allergy status to analgesic agent status: Secondary | ICD-10-CM | POA: Diagnosis not present

## 2017-07-16 DIAGNOSIS — Z681 Body mass index (BMI) 19 or less, adult: Secondary | ICD-10-CM | POA: Diagnosis not present

## 2017-07-16 LAB — EXPECTORATED SPUTUM ASSESSMENT W GRAM STAIN, RFLX TO RESP C

## 2017-07-16 LAB — CBC
HCT: 34.6 % — ABNORMAL LOW (ref 36.0–46.0)
Hemoglobin: 11.3 g/dL — ABNORMAL LOW (ref 12.0–15.0)
MCH: 29.8 pg (ref 26.0–34.0)
MCHC: 32.7 g/dL (ref 30.0–36.0)
MCV: 91.3 fL (ref 78.0–100.0)
Platelets: 279 10*3/uL (ref 150–400)
RBC: 3.79 MIL/uL — ABNORMAL LOW (ref 3.87–5.11)
RDW: 14.4 % (ref 11.5–15.5)
WBC: 8.2 10*3/uL (ref 4.0–10.5)

## 2017-07-16 LAB — BASIC METABOLIC PANEL
Anion gap: 10 (ref 5–15)
BUN: 23 mg/dL — ABNORMAL HIGH (ref 6–20)
CO2: 24 mmol/L (ref 22–32)
Calcium: 9.1 mg/dL (ref 8.9–10.3)
Chloride: 105 mmol/L (ref 101–111)
Creatinine, Ser: 1.37 mg/dL — ABNORMAL HIGH (ref 0.44–1.00)
GFR calc Af Amer: 42 mL/min — ABNORMAL LOW (ref >60)
GFR calc non Af Amer: 36 mL/min — ABNORMAL LOW (ref >60)
Glucose, Bld: 72 mg/dL (ref 65–99)
Potassium: 3 mmol/L — ABNORMAL LOW (ref 3.5–5.1)
Sodium: 139 mmol/L (ref 135–145)

## 2017-07-16 LAB — MRSA PCR SCREENING: MRSA by PCR: NEGATIVE

## 2017-07-16 LAB — URIC ACID: Uric Acid, Serum: 8.4 mg/dL — ABNORMAL HIGH (ref 2.3–6.6)

## 2017-07-16 LAB — STREP PNEUMONIAE URINARY ANTIGEN: Strep Pneumo Urinary Antigen: NEGATIVE

## 2017-07-16 MED ORDER — POTASSIUM CHLORIDE CRYS ER 20 MEQ PO TBCR
40.0000 meq | EXTENDED_RELEASE_TABLET | ORAL | Status: AC
Start: 2017-07-16 — End: 2017-07-16
  Administered 2017-07-16: 40 meq via ORAL
  Administered 2017-07-16: 20 meq via ORAL
  Filled 2017-07-16 (×2): qty 2

## 2017-07-16 NOTE — Progress Notes (Signed)
PROGRESS NOTE    Deborah Shields  SEG:[id number] DOB: [birth year] DOA: 07/15/2017 PCP: Jani Gravel, MD   Brief Narrative: Patient is a [age]-year-old female with past medical history of hypertension, paroxysmal A. fib, CKD, chronic anemia, hypothyroidism who presented to the emergency department with hemoptysis.  Patient was admitted for further evaluation.  CT angiogram of the chest showed patchy parenchymal involvement concerning for infectious versus inflammatory process. hemoptysis suspected from pneumonia.  Currently on IV antibiotics.  Hemoptysis stopped currently.  Assessment & Plan:   Principal Problem:   Hemoptysis Active Problems:   Hypothyroidism   HYPERLIPIDEMIA   Essential hypertension   Chronic renal insufficiency, stage III (moderate) (HCC)   PAF (paroxysmal atrial fibrillation) (HCC)   Malnutrition of moderate degree   Hemoptysis: Resolved.  Pulmonary was consulted on presentation. At present we will continue ceftriaxone and azithromycin. Patient also reported of unintentional 40 pounds weight loss since last year.. Currently she is afebrile. No history of contact with tuberculosis patient.  No history of travel outside  Left great toe pain: Concerning for gout.  Uric acid level is elevated.  Patient was on Uloriclast month which had been stopped due to angioedema. Patient might be started on allopurinol on discharge.  Anemia: Most likely chronic.  H&H stable.  Chronic kidney disease stage III: Currently kidney function on baseline.  Hypertension: Currently blood pressure stable.  Continue Toprol.  Hypothyroidism:Continue synthyroid.  History of paroxysmal A. VPX:TGGYIRSWN rate is controlled.  Only one episode as per the cardiology note.  Not on anticoagulation.  She is on aspirin at home which is on hold.  Might resume on discharge.  Hypokalemia: Supplemented.  Check the levels tomorrow   DVT prophylaxis:SCD Code Status: Full Family Communication: None  present at the bedside Disposition Plan: Tomorrow to home   Consultants: P CCM  Procedures: None  Antimicrobials: Ceftriaxone and azithromycin since 07/15/17.  Subjective: Patient seen and examined the bedside this morning.  Remains comfortable.  Denies any chest pain or shortness of breath.  Hemoptysis stopped.  Respiratory status stable  Objective: Vitals:   07/16/17 0101 07/16/17 0220 07/16/17 0643 07/16/17 1417  BP: 139/79 127/71 112/71 119/61  Pulse: 78 79 89 80  Resp: 13  16 16   Temp:  98.2 F (36.8 C) 98 F (36.7 C) 98.3 F (36.8 C)  TempSrc:  Oral Oral Oral  SpO2: 96% 96% 99% 100%  Weight:  48.6 kg (107 lb 1.6 oz)    Height:  5\' 5"  (1.651 m)      Intake/Output Summary (Last 24 hours) at 07/16/2017 1455 Last data filed at 07/15/2017 1813 Gross per 24 hour  Intake 500 ml  Output -  Net 500 ml   Filed Weights   07/15/17 1557 07/16/17 0220  Weight: 47.2 kg (104 lb) 48.6 kg (107 lb 1.6 oz)    Examination:  General exam: Appears calm and comfortable ,Not in distress,average built Respiratory system: Bilateral equal air entry, normal vesicular breath sounds, no wheezes or crackles  Cardiovascular system: S1 & S2 heard, RRR. No JVD, murmurs, rubs, gallops or clicks. No pedal edema. Gastrointestinal system: Abdomen is nondistended, soft and nontender. No organomegaly or masses felt. Normal bowel sounds heard. Central nervous system: Alert and oriented. No focal neurological deficits. Extremities: No edema, no clubbing ,no cyanosis, distal peripheral pulses palpable. Skin: No rashes, lesions or ulcers,no icterus ,no pallor Psychiatry: Judgement and insight appear normal. Mood & affect appropriate.     Data Reviewed: I have personally reviewed following  labs and imaging studies  CBC: Recent Labs  Lab 07/15/17 1736 07/16/17 0352  WBC 8.4 8.2  NEUTROABS 5.5  --   HGB 11.9* 11.3*  HCT 36.2 34.6*  MCV 92.3 91.3  PLT 313 409   Basic Metabolic Panel: Recent  Labs  Lab 07/15/17 1736 07/16/17 0352  NA 138 139  K 3.9 3.0*  CL 102 105  CO2 26 24  GLUCOSE 91 72  BUN 28* 23*  CREATININE 1.36* 1.37*  CALCIUM 9.9 9.1   GFR: Estimated Creatinine Clearance: 26 mL/min (A) (by C-G formula based on SCr of 1.37 mg/dL (H)). Liver Function Tests: Recent Labs  Lab 07/15/17 1736  AST 30  ALT 17  ALKPHOS 106  BILITOT 0.9  PROT 7.5  ALBUMIN 3.7   No results for input(s): LIPASE, AMYLASE in the last 168 hours. No results for input(s): AMMONIA in the last 168 hours. Coagulation Profile: Recent Labs  Lab 07/15/17 1736  INR 0.97   Cardiac Enzymes: No results for input(s): CKTOTAL, CKMB, CKMBINDEX, TROPONINI in the last 168 hours. BNP (last 3 results) No results for input(s): PROBNP in the last 8760 hours. HbA1C: No results for input(s): HGBA1C in the last 72 hours. CBG: No results for input(s): GLUCAP in the last 168 hours. Lipid Profile: No results for input(s): CHOL, HDL, LDLCALC, TRIG, CHOLHDL, LDLDIRECT in the last 72 hours. Thyroid Function Tests: No results for input(s): TSH, T4TOTAL, FREET4, T3FREE, THYROIDAB in the last 72 hours. Anemia Panel: No results for input(s): VITAMINB12, FOLATE, FERRITIN, TIBC, IRON, RETICCTPCT in the last 72 hours. Sepsis Labs: No results for input(s): PROCALCITON, LATICACIDVEN in the last 168 hours.  Recent Results (from the past 240 hour(s))  MRSA PCR Screening     Status: None   Collection Time: 07/16/17 10:07 AM  Result Value Ref Range Status   MRSA by PCR NEGATIVE NEGATIVE Final    Comment:        The GeneXpert MRSA Assay (FDA approved for NASAL specimens only), is one component of a comprehensive MRSA colonization surveillance program. It is not intended to diagnose MRSA infection nor to guide or monitor treatment for MRSA infections.   Culture, sputum-assessment     Status: None   Collection Time: 07/16/17 11:39 AM  Result Value Ref Range Status   Specimen Description SPUTUM  Final     Special Requests NONE  Final   Sputum evaluation THIS SPECIMEN IS ACCEPTABLE FOR SPUTUM CULTURE  Final   Report Status 07/16/2017 FINAL  Final         Radiology Studies: Dg Chest 2 View  Result Date: 07/15/2017 CLINICAL DATA:  Hemoptysis EXAM: CHEST  2 VIEW COMPARISON:  05/13/2016, 04/27/2015 FINDINGS: Hyperinflation. No focal pulmonary infiltrate, consolidation, or pleural effusion. Cardiomediastinal silhouette within normal limits. Aortic atherosclerosis. No pneumothorax. Surgical clips in the right upper quadrant IMPRESSION: No active cardiopulmonary disease. Electronically Signed   By: Donavan Foil M.D.   On: 07/15/2017 17:59   Ct Angio Chest Pe W And/or Wo Contrast  Result Date: 07/15/2017 CLINICAL DATA:  78 year old with hemoptysis since this morning. Concern for pulmonary embolism. EXAM: CT ANGIOGRAPHY CHEST WITH CONTRAST TECHNIQUE: Multidetector CT imaging of the chest was performed using the standard protocol during bolus administration of intravenous contrast. Multiplanar CT image reconstructions and MIPs were obtained to evaluate the vascular anatomy. CONTRAST:  155mL ISOVUE-370 IOPAMIDOL (ISOVUE-370) INJECTION 76% COMPARISON:  Chest radiograph 07/15/2016 FINDINGS: Cardiovascular: Negative for pulmonary embolism. Negative for an aortic dissection. Great vessels are patent. Atherosclerotic  calcifications in the thoracic aorta. Mild narrowing in the proximal left subclavian artery due to atherosclerotic disease. No significant pericardial fluid. Mediastinum/Nodes: No evidence for mediastinal, hilar or axillary lymphadenopathy. Proximal and mid esophagus is dilated with gas. Lungs/Pleura: The trachea and mainstem bronchi are patent. There is extensive scarring at both lung apices with air bronchograms along the medial right upper lobe. Patchy parenchymal and small nodular densities in both lungs suggesting inflammatory changes of uncertain age. There are endobronchial densities involving  the airways of the lingula. Endobronchial densities could be related to hemoptysis. There is endobronchial material at the origin of the left upper lobe bronchus. No large pleural effusions. Upper Abdomen: Low-density structure in the left kidney upper pole is most compatible with a cyst. No acute abnormality in upper abdomen. Musculoskeletal: Prominent calcifications along the posterior vertebral bodies and disc at T6-T7. Marked disc space disease and endplate changes at M4-Q6. Review of the MIP images confirms the above findings. IMPRESSION: Negative for a pulmonary embolism. Patchy parenchymal disease and small nodules. Findings are suggestive for an infectious or inflammatory process of uncertain age. There are endobronchial densities in the lingula and near the origin of the left upper lobe bronchus. This may be accounting for patient's hemoptysis. Extensive scarring at both lung apices. Mild dilatation of the esophagus. Aortic Atherosclerosis (ICD10-I70.0). Electronically Signed   By: Markus Daft M.D.   On: 07/15/2017 20:09        Scheduled Meds: . levothyroxine  88 mcg Oral QAC breakfast  . LORazepam  0.5 mg Oral QHS  . metoprolol succinate  50 mg Oral Daily  . pantoprazole  40 mg Oral Daily  . potassium chloride  40 mEq Oral Q4H   Continuous Infusions: . azithromycin    . cefTRIAXone (ROCEPHIN)  IV       LOS: 0 days    Time spent:     Marene Lenz, MD [facility] Pager [phone]  If 7PM-7AM, please contact night-coverage [email or web address] Password [facility] 07/16/2017, 2:55 PM

## 2017-07-16 NOTE — Consult Note (Signed)
Name: Deborah Shields MRN: [id number] DOB: 08-04-1940    ADMISSION DATE:  07/15/2017 CONSULTATION DATE:  07/16/2017   REFERRING MD : Deborah Shields, triad  CHIEF COMPLAINT: Coughing up blood     HISTORY OF PRESENT ILLNESS: 77 year old remote smoker presented to the ED with history of coughing up blood for 1 day.  She reports an episode that woke her up from sleep and she felt the need to cough and saw about half a cup of frank blood with some clots she reports 2 more episodes since then.  This morning she only had some blood tinged sputum.  Sputum was otherwise clear. She denies preceding URI symptoms or fevers. No similar episodes in the past medication review does not show any blood thinner She initially saidVomiting but on clarification this does seem to be coughing up blood  She has a past medical history of osteoarthritis, gout and paroxysmal atrial fibrillation She is a remote smoker and smoked about 20 pack years before she quit more than 25 years ago.  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  has a past surgical history that includes Abdominal hysterectomy (1973); Bilateral oophorectomy (1990); Appendectomy (1952); Cholecystectomy (2006); Tonsillectomy (2330); orthopedic surgery (1989/200/2002/2012); Carpal tunnel release (Bilateral, 1989); basal cell cancer (03/2006); Cataract extraction, bilateral (2010); US  ECHOCARDIOGRAPHY (05/01/2010); NM MYOVIEW LTD (06/13/2008); Colonoscopy; Rectocele repair (2016); Eye surgery (Left); and Lumbar laminectomy/decompression microdiscectomy (Left, 03/20/2017). Prior to Admission medications   Medication Sig Start Date End Date Taking? Authorizing Provider  aspirin 81 MG tablet Take 81 mg by mouth at bedtime.    Yes [provider]  b complex vitamins tablet Take 1 tablet by mouth daily after supper.    Yes [provider]  FIBER PO Take 1 tablet by mouth daily.   Yes [provider]  levothyroxine (SYNTHROID) 88 MCG tablet Take 88 mcg by mouth daily before breakfast. Taking the brand "synthroid"   Yes [provider]  LORazepam (ATIVAN) 0.5 MG tablet Take 1 tablet (0.5 mg total) by mouth 3 (three) times daily as needed. Patient taking differently: Take 0.5 mg by mouth at bedtime.  10/17/15  Yes Burns, Claudina Lick, MD  meclizine (ANTIVERT) 25 MG tablet Take 1 tablet (25 mg total) by mouth daily. 1/2 every 8 hrs prn for imbalance Patient taking differently: Take 12.5 mg by mouth at bedtime.  11/01/10  Yes Hendricks Limes, MD  metoprolol tartrate (LOPRESSOR) 25 MG tablet Take 1 tablet (25 mg total) by mouth 2 (two) times daily. 11/29/16  Yes Almyra Deforest, PA  Multiple Vitamins-Minerals (CENTRUM SILVER PO) Take 1 tablet by mouth daily after supper.    Yes [provider]  pantoprazole (PROTONIX) 40 MG tablet Take 40 mg by mouth daily.  05/27/17  Yes [provider]  traMADol (ULTRAM) 50 MG tablet 1/2-1 by mouth once daily as needed. Patient taking differently: Take 25-50 mg by mouth at bedtime as needed for moderate pain or severe pain.  10/17/15  Yes Burns, Claudina Lick, MD  bisacodyl (DULCOLAX) 5 MG EC tablet Take 5 mg  by mouth daily as needed for moderate constipation.    [provider]  cyclobenzaprine (FLEXERIL) 10 MG tablet Take 10 mg by mouth daily as needed for muscle spasms.    [provider]  diclofenac  sodium (VOLTAREN) 1 % GEL Apply 1 application topically as needed (for pain).    [provider]  febuxostat (ULORIC) 40 MG tablet Take 40 mg by mouth at bedtime.    [provider]  HYDROcodone-acetaminophen (NORCO/VICODIN) 5-325 MG tablet Take 1 tablet by mouth every 4 (four) hours as needed (pain). Patient not taking: Reported on 07/15/2017 03/21/17   Jovita Gamma, MD  metoprolol succinate (TOPROL-XL) 50 MG 24 hr tablet Take 50 mg by mouth daily.  05/27/17   [provider]  psyllium (REGULOID) 0.52 g capsule Take 0.52 g by mouth daily. FIBER    [provider]   Allergies  Allergen Reactions  . Aspirin Nausea Only and Other (See Comments)    REACTION: GI upset  ( pt can take 81 mg but NOT   325 mg ASA )  . Lovastatin Nausea Only and Other (See Comments)    REACTION: nausea  . Benazepril Hcl Other (See Comments)    Unknown  . Paroxetine Other (See Comments)    Unknown  . Bupropion Hcl Other (See Comments)    REACTION: tinnitis  . Ezetimibe Nausea And Vomiting and Other (See Comments)    REACTION: GI symptoms  . Fenofibrate Other (See Comments)    Myalgia   . Pravastatin Sodium Other (See Comments)    REACTION: elevated CPK - Muscle's in bilateral Leg    FAMILY HISTORY:  family history includes Cancer - Colon (age of onset: 78) in her mother; Colon cancer (age of onset: 27) in her mother; Heart attack (age of onset: 38) in her father; Heart disease in her brother, father, and sister; Kidney disease in her mother; Other in her sister; Ovarian cancer in her sister; Stroke (age of onset: 75) in her mother. SOCIAL HISTORY:  reports that  has never smoked. she has never used smokeless tobacco. She reports that she does not drink alcohol or use drugs.  REVIEW OF SYSTEMS:   Constitutional: Negative for fever, chills, weight loss, malaise/fatigue and diaphoresis.  HENT: Negative for hearing loss, ear pain, nosebleeds, congestion, sore throat, neck  pain, tinnitus and ear discharge.   Eyes: Negative for blurred vision, double vision, photophobia, pain, discharge and redness.  Respiratory: Negative for cough, she is a remote smoker and smoked about shortness of breath, wheezing and stridor.   Cardiovascular: Negative for chest pain, palpitations, orthopnea, claudication, leg swelling and PND.  Gastrointestinal: Negative for heartburn, nausea, vomiting, abdominal pain, diarrhea, constipation, blood in stool and melena.  Genitourinary: Negative for dysuria, urgency, frequency, hematuria and flank pain.  Musculoskeletal: Negative for myalgias, back pain, joint pain and falls.  Skin: Negative for itching and rash.  Neurological: Negative for dizziness, tingling, tremors, sensory change, speech change, focal weakness, seizures, loss of consciousness, weakness and headaches.  Endo/Heme/Allergies: Negative for environmental allergies and polydipsia. Does not bruise/bleed easily.  SUBJECTIVE:   VITAL SIGNS: Temp:  [98 F (36.7 C)-98.3 F (36.8 C)] 98.3 F (36.8 C) (01/23 1417) Pulse Rate:  [72-89] 80 (01/23 1417) Resp:  [13-27] 16 (01/23 1417) BP: (111-180)/(61-92) 119/61 (01/23 1417) SpO2:  [96 %-100 %] 100 % (01/23 1417) Weight:  [104 lb (47.2 kg)-107 lb 1.6 oz (48.6 kg)] 107 lb 1.6 oz (48.6 kg) (01/23 0220)  PHYSICAL EXAMINATION: Gen.  Pleasant, thin, elderly,, in no distress, normal affect ENT - no lesions, no post nasal drip Neck: No JVD, no thyromegaly, no carotid bruits Lungs: no use of accessory muscles, no dullness to percussion, clear without rales or rhonchi  Cardiovascular: Rhythm regular, heart sounds  normal, no murmurs or gallops, no peripheral edema Abdomen: soft and non-tender, no hepatosplenomegaly, BS normal. Musculoskeletal: No deformities, no cyanosis or clubbing Neuro:  alert, non focal   Recent Labs  Lab 07/15/17 1736 07/16/17 0352  NA 138 139  K 3.9 3.0*  CL 102 105  CO2 26 24  BUN 28* 23*  CREATININE  1.36* 1.37*  GLUCOSE 91 72   Recent Labs  Lab 07/15/17 1736 07/16/17 0352  HGB 11.9* 11.3*  HCT 36.2 34.6*  WBC 8.4 8.2  PLT 313 279   Dg Chest 2 View  Result Date: 07/15/2017 CLINICAL DATA:  Hemoptysis EXAM: CHEST  2 VIEW COMPARISON:  05/13/2016, 04/27/2015 FINDINGS: Hyperinflation. No focal pulmonary infiltrate, consolidation, or pleural effusion. Cardiomediastinal silhouette within normal limits. Aortic atherosclerosis. No pneumothorax. Surgical clips in the right upper quadrant IMPRESSION: No active cardiopulmonary disease. Electronically Signed   By: Donavan Foil M.D.   On: 07/15/2017 17:59   Ct Angio Chest Pe W And/or Wo Contrast  Result Date: 07/15/2017 CLINICAL DATA:  77 year old with hemoptysis since this morning. Concern for pulmonary embolism. EXAM: CT ANGIOGRAPHY CHEST WITH CONTRAST TECHNIQUE: Multidetector CT imaging of the chest was performed using the standard protocol during bolus administration of intravenous contrast. Multiplanar CT image reconstructions and MIPs were obtained to evaluate the vascular anatomy. CONTRAST:  153mL ISOVUE-370 IOPAMIDOL (ISOVUE-370) INJECTION 76% COMPARISON:  Chest radiograph 07/15/2016 FINDINGS: Cardiovascular: Negative for pulmonary embolism. Negative for an aortic dissection. Great vessels are patent. Atherosclerotic calcifications in the thoracic aorta. Mild narrowing in the proximal left subclavian artery due to atherosclerotic disease. No significant pericardial fluid. Mediastinum/Nodes: No evidence for mediastinal, hilar or axillary lymphadenopathy. Proximal and mid esophagus is dilated with gas. Lungs/Pleura: The trachea and mainstem bronchi are patent. There is extensive scarring at both lung apices with air bronchograms along the medial right upper lobe. Patchy parenchymal and small nodular densities in both lungs suggesting inflammatory changes of uncertain age. There are endobronchial densities involving the airways of the lingula.  Endobronchial densities could be related to hemoptysis. There is endobronchial material at the origin of the left upper lobe bronchus. No large pleural effusions. Upper Abdomen: Low-density structure in the left kidney upper pole is most compatible with a cyst. No acute abnormality in upper abdomen. Musculoskeletal: Prominent calcifications along the posterior vertebral bodies and disc at T6-T7. Marked disc space disease and endplate changes at U5-K2. Review of the MIP images confirms the above findings. IMPRESSION: Negative for a pulmonary embolism. Patchy parenchymal disease and small nodules. Findings are suggestive for an infectious or inflammatory process of uncertain age. There are endobronchial densities in the lingula and near the origin of the left upper lobe bronchus. This may be accounting for patient's hemoptysis. Extensive scarring at both lung apices. Mild dilatation of the esophagus. Aortic Atherosclerosis (ICD10-I70.0). Electronically Signed   By: Markus Daft M.D.   On: 07/15/2017 20:09    ASSESSMENT / PLAN:   Hemoptysis with suggestion of endobronchial lesion in the lingular bronchus -Mild bronchiectasis noted which may be the cause. -No preceding symptoms to suggest acute bronchitis  Recommend-we will proceed with inspection bronchoscopy to further investigate lingular bronchus finding and rule out endobronchial obstruction, this could be aspirated blood.  We  will also inspect upper airway in the springtime but on exam does not seem to have any other upper airway source of bleeding  Kara Mead MD. FCCP.  Pulmonary & Critical care Pager [phone] If no response call [phone]      07/16/2017, 3:55 PM

## 2017-07-16 NOTE — ED Notes (Signed)
ED TO INPATIENT HANDOFF REPORT  Name/Age/Gender Deborah Shields 77 y.o. female  Code Status    Code Status Orders  (From admission, onward)        Start     Ordered   07/15/17 2231  Full code  Continuous     07/15/17 2230    Code Status History    Date Active Date Inactive Code Status Order ID Comments User Context   03/20/2017 14:04 03/21/2017 13:22 Full Code [id number]  Jovita Gamma, MD Inpatient    Advance Directive Documentation     Most Recent Value  Type of Advance Directive  Healthcare Power of Attorney, Living will  Pre-existing out of facility DNR order (yellow form or pink MOST form)  No data  "MOST" Form in Place?  No data      Home/SNF/Other Home  Chief Complaint vomiting blood   Level of Care/Admitting Diagnosis ED Disposition    ED Disposition Condition Comment   Admit  [facility]ospital Area: [facility] [[id number]]  Level of Care: Med-Surg [16]  Diagnosis: Hemoptysis [557322]  Admitting Physician: Rise Patience [phone]  Attending Physician: Rise Patience [phone]  PT Class (Do Not Modify): Observation [104]  PT Acc Code (Do Not Modify): Observation [10022]       Medical History Past Medical History:  Diagnosis Date  . Anxiety   . Aortic insufficiency    mild due to degenerative changes  . Arthritis   . Basal cell carcinoma 2007   [place] [facility] Left leg  . Carotid artery occlusion   . Chronic kidney disease    CRD Stage 3  . Conjunctivitis due to adenovirus, both eyes   . Degenerative joint disease   . Diverticulosis of colon   . Dyspnea    with exertion  . Heart murmur   . Hyperlipidemia    NMR 07/2009: LDL 200 (2260/1233)TG 99, HDL 65. LDL goal =<120, ideally <100. father MI @ 65  . Hypertension   . Hypothyroidism    Dr Wilson Singer  . Microscopic hematuria   . Peripheral neuropathy    compressive in UE bilaterally; Dr Daylene Katayama  . Peripheral vascular disease ([place])    ICA bilat, Dr.Charles fields, [facility]  . Pneumonia     2017  . PONV (postoperative nausea and vomiting)    "Inner ear, does  okay with Scopolamine"  . Rectocele   . Rocky Mountain spotted fever     Allergies Allergies  Allergen Reactions  . Aspirin Nausea Only and Other (See Comments)    REACTION: GI upset  ( pt can take 81 mg but NOT   325 mg ASA )  . Lovastatin Nausea Only and Other (See Comments)    REACTION: nausea  . Benazepril Hcl Other (See Comments)    Unknown  . Paroxetine Other (See Comments)    Unknown  . Bupropion Hcl Other (See Comments)    REACTION: tinnitis  . Ezetimibe Nausea And Vomiting and Other (See Comments)    REACTION: GI symptoms  . Fenofibrate Other (See Comments)    Myalgia   . Pravastatin Sodium Other (See Comments)    REACTION: elevated CPK - Muscle's in bilateral Leg    IV Location/Drains/Wounds Patient Lines/Drains/Airways Status   Active Line/Drains/Airways    Name:   Placement date:   Placement time:   Site:   Days:   Peripheral IV 07/15/17 Right Antecubital   07/15/17    1700    Antecubital   1  Incision (Closed) 03/20/17 Back Other (Comment)   03/20/17    1121     118          Labs/Imaging Results for orders placed or performed during the hospital encounter of 07/15/17 (from the past 48 hour(s))  Type and screen [place]     Status: None   Collection Time: 07/15/17  5:33 PM  Result Value Ref Range   ABO/RH(D) A POS    Antibody Screen NEG    Sample Expiration 07/18/2017   ABO/Rh     Status: None   Collection Time: 07/15/17  5:33 PM  Result Value Ref Range   ABO/RH(D) A POS   Comprehensive metabolic panel     Status: Abnormal   Collection Time: 07/15/17  5:36 PM  Result Value Ref Range   Sodium 138 135 - 145 mmol/L   Potassium 3.9 3.5 - 5.1 mmol/L   Chloride 102 101 - 111 mmol/L   CO2 26 22 - 32 mmol/L   Glucose, Bld 91 65 - 99 mg/dL   BUN 28 (H) 6 - 20 mg/dL   Creatinine, Ser 1.36 (H) 0.44 - 1.00 mg/dL   Calcium 9.9 8.9 - 10.3 mg/dL   Total Protein  7.5 6.5 - 8.1 g/dL   Albumin 3.7 3.5 - 5.0 g/dL   AST 30 15 - 41 U/L   ALT 17 14 - 54 U/L   Alkaline Phosphatase 106 38 - 126 U/L   Total Bilirubin 0.9 0.3 - 1.2 mg/dL   GFR calc non Af Amer 36 (L) >60 mL/min   GFR calc Af Amer 42 (L) >60 mL/min    Comment: (NOTE) The eGFR has been calculated using the CKD EPI equation. This calculation has not been validated in all clinical situations. eGFR's persistently <60 mL/min signify possible Chronic Kidney Disease.    Anion gap 10 5 - 15  CBC with Differential     Status: Abnormal   Collection Time: 07/15/17  5:36 PM  Result Value Ref Range   WBC 8.4 4.0 - 10.5 K/uL   RBC 3.92 3.87 - 5.11 MIL/uL   Hemoglobin 11.9 (L) 12.0 - 15.0 g/dL   HCT 36.2 36.0 - 46.0 %   MCV 92.3 78.0 - 100.0 fL   MCH 30.4 26.0 - 34.0 pg   MCHC 32.9 30.0 - 36.0 g/dL   RDW 14.1 11.5 - 15.5 %   Platelets 313 150 - 400 K/uL   Neutrophils Relative % 65 %   Neutro Abs 5.5 1.7 - 7.7 K/uL   Lymphocytes Relative 21 %   Lymphs Abs 1.8 0.7 - 4.0 K/uL   Monocytes Relative 11 %   Monocytes Absolute 0.9 0.1 - 1.0 K/uL   Eosinophils Relative 2 %   Eosinophils Absolute 0.2 0.0 - 0.7 K/uL   Basophils Relative 1 %   Basophils Absolute 0.0 0.0 - 0.1 K/uL  Protime-INR     Status: None   Collection Time: 07/15/17  5:36 PM  Result Value Ref Range   Prothrombin Time 12.8 11.4 - 15.2 seconds   INR 0.97   I-stat troponin, ED     Status: None   Collection Time: 07/15/17  5:47 PM  Result Value Ref Range   Troponin i, poc 0.01 0.00 - 0.08 ng/mL   Comment 3            Comment: Due to the release kinetics of cTnI, a negative result within the first hours of the onset of symptoms does  not rule out myocardial infarction with certainty. If myocardial infarction is still suspected, repeat the test at appropriate intervals.    Dg Chest 2 View  Result Date: 07/15/2017 CLINICAL DATA:  Hemoptysis EXAM: CHEST  2 VIEW COMPARISON:  05/13/2016, 04/27/2015 FINDINGS: Hyperinflation. No  focal pulmonary infiltrate, consolidation, or pleural effusion. Cardiomediastinal silhouette within normal limits. Aortic atherosclerosis. No pneumothorax. Surgical clips in the right upper quadrant IMPRESSION: No active cardiopulmonary disease. Electronically Signed   By: Donavan Foil M.D.   On: 07/15/2017 17:59   Ct Angio Chest Pe W And/or Wo Contrast  Result Date: 07/15/2017 CLINICAL DATA:  77 year old with hemoptysis since this morning. Concern for pulmonary embolism. EXAM: CT ANGIOGRAPHY CHEST WITH CONTRAST TECHNIQUE: Multidetector CT imaging of the chest was performed using the standard protocol during bolus administration of intravenous contrast. Multiplanar CT image reconstructions and MIPs were obtained to evaluate the vascular anatomy. CONTRAST:  134m ISOVUE-370 IOPAMIDOL (ISOVUE-370) INJECTION 76% COMPARISON:  Chest radiograph 07/15/2016 FINDINGS: Cardiovascular: Negative for pulmonary embolism. Negative for an aortic dissection. Great vessels are patent. Atherosclerotic calcifications in the thoracic aorta. Mild narrowing in the proximal left subclavian artery due to atherosclerotic disease. No significant pericardial fluid. Mediastinum/Nodes: No evidence for mediastinal, hilar or axillary lymphadenopathy. Proximal and mid esophagus is dilated with gas. Lungs/Pleura: The trachea and mainstem bronchi are patent. There is extensive scarring at both lung apices with air bronchograms along the medial right upper lobe. Patchy parenchymal and small nodular densities in both lungs suggesting inflammatory changes of uncertain age. There are endobronchial densities involving the airways of the lingula. Endobronchial densities could be related to hemoptysis. There is endobronchial material at the origin of the left upper lobe bronchus. No large pleural effusions. Upper Abdomen: Low-density structure in the left kidney upper pole is most compatible with a cyst. No acute abnormality in upper abdomen.  Musculoskeletal: Prominent calcifications along the posterior vertebral bodies and disc at T6-T7. Marked disc space disease and endplate changes at LL8-G5 Review of the MIP images confirms the above findings. IMPRESSION: Negative for a pulmonary embolism. Patchy parenchymal disease and small nodules. Findings are suggestive for an infectious or inflammatory process of uncertain age. There are endobronchial densities in the lingula and near the origin of the left upper lobe bronchus. This may be accounting for patient's hemoptysis. Extensive scarring at both lung apices. Mild dilatation of the esophagus. Aortic Atherosclerosis (ICD10-I70.0). Electronically Signed   By: AMarkus DaftM.D.   On: 07/15/2017 20:09    Pending Labs Unresulted Labs (From admission, onward)   Start     Ordered   07/16/17 03646 Basic metabolic panel  Tomorrow morning,   R     07/15/17 2230   07/16/17 0500  CBC  Tomorrow morning,   R     07/15/17 2230   07/15/17 2230  Culture, sputum-assessment  Once,   R     07/15/17 2230   07/15/17 2230  Gram stain  Once,   R     07/15/17 2230   07/15/17 2230  Strep pneumoniae urinary antigen  Once,   R     07/15/17 2230   07/15/17 2230  Legionella Pneumophila Serogp 1 Ur Ag  Once,   R     07/15/17 2230      Vitals/Pain Today's Vitals   07/16/17 0030 07/16/17 0100 07/16/17 0101 07/16/17 0106  BP: 127/80 139/79 139/79   Pulse: 81 78 78   Resp: 14 14 13    Temp:  TempSrc:      SpO2: 96% 97% 96%   Weight:      Height:      PainSc:    9     Isolation Precautions No active isolations  Medications Medications  cefTRIAXone (ROCEPHIN) 1 g in dextrose 5 % 50 mL IVPB (not administered)  azithromycin (ZITHROMAX) 500 mg in dextrose 5 % 250 mL IVPB (not administered)  bisacodyl (DULCOLAX) EC tablet 5 mg (not administered)  diclofenac sodium (VOLTAREN) 1 % transdermal gel 1 application (not administered)  levothyroxine (SYNTHROID, LEVOTHROID) tablet 88 mcg (not administered)   LORazepam (ATIVAN) tablet 0.5 mg (0.5 mg Oral Given 07/16/17 0105)  metoprolol succinate (TOPROL-XL) 24 hr tablet 50 mg (not administered)  pantoprazole (PROTONIX) EC tablet 40 mg (not administered)  traMADol (ULTRAM) tablet 25-50 mg (50 mg Oral Given 07/16/17 0109)  acetaminophen (TYLENOL) tablet 650 mg (not administered)    Or  acetaminophen (TYLENOL) suppository 650 mg (not administered)  ondansetron (ZOFRAN) tablet 4 mg (not administered)    Or  ondansetron (ZOFRAN) injection 4 mg (not administered)  hydrALAZINE (APRESOLINE) injection 10 mg (not administered)  sodium chloride 0.9 % bolus 500 mL (0 mLs Intravenous Stopped 07/15/17 1813)  iopamidol (ISOVUE-370) 76 % injection (100 mLs  Contrast Given 07/15/17 1937)  cefTRIAXone (ROCEPHIN) 1 g in dextrose 5 % 50 mL IVPB (0 g Intravenous Stopped 07/15/17 2210)  azithromycin (ZITHROMAX) 500 mg in dextrose 5 % 250 mL IVPB (0 mg Intravenous Stopped 07/16/17 0102)    Mobility walks

## 2017-07-16 NOTE — Progress Notes (Signed)
  Initial Nutrition Assessment  DOCUMENTATION CODES:   Non-severe (moderate) malnutrition in context of acute illness/injury, Underweight  INTERVENTION:  RD will continue to monitor for nutrition needs and p.o. Intake.   NUTRITION DIAGNOSIS:   Moderate Malnutrition related to acute illness, decreased appetite(hemoptysis) as evidenced by energy intake < 75% for > 7 days, moderate muscle depletion, mild fat depletion.   GOAL:   Patient will meet greater than or equal to 90% of their needs  MONITOR:   PO intake, Weight trends  REASON FOR ASSESSMENT:   Malnutrition Screening Tool    ASSESSMENT:   77 y.o. female with PMHx of HTN, paroxysmal a-fib, CKD, chronic anemia and hypothyroidism. Admitted for hemoptysis & currently being treated for pneumonia. Awaiting visit from pulmonary critical care.  Pt reports a decent, but less than normal appetite. She denies nausea.   After admission yesterday evening, pt's daughter brought her a 1/2 [place] sandwich and potato soup from [place] late last night. At time of visit, nurse came in and instructed pt's daughter how to order the pt's breakfast. Pt was not interested in taking nutrition supplements during this admission. Pt did not consume nutrition supplements PTA.  Pt reports weighing 138-140lb ~1.5 years ago. At that time she contracted [facility] Spotted Fever and has experienced various health issues since then. Prior weight of 138-140lb could not be confirmed in medical record.  Pt reports remaining ~104lb for the past 6 months. Per recorded weights, pt was 110lbs on 01/21/17 and has remained stable until this admission.   PTA pt reports she does not each much and typically consumes 2 meals a day. She wakes up later in the day and usually has cereal with whole milk and juice at lunchtime. For dinner she typically consumes a meat, peas/greens, and sometimes a starch (potatoes or rice). Pt reports she drinks a lot of juice throughout the  day. Pt reports she does not usually snack.  Estimated energy intake ~1150kcal (72-82% of needs)  Estimated protein intake ~40g  (57-67% of needs)  Labs: K+ (L)  Medications: Zithromax, Rocephin, Potassium Chloride tablet.   NUTRITION - FOCUSED PHYSICAL EXAM:    Most Recent Value  Orbital Region  Mild depletion  Upper Arm Region  Mild depletion  Thoracic and Lumbar Region  Mild depletion  Buccal Region  Mild depletion  Temple Region  Moderate depletion  Clavicle Bone Region  Mild depletion  Clavicle and Acromion Bone Region  Moderate depletion  Scapular Bone Region  Moderate depletion  Dorsal Hand  Mild depletion  Patellar Region  Moderate depletion  Anterior Thigh Region  Moderate depletion  Posterior Calf Region  Mild depletion       Diet Order:  Diet Heart Room service appropriate? Yes; Fluid consistency: Thin  EDUCATION NEEDS:   No education needs have been identified at this time  Skin:  Skin Assessment: Reviewed RN Assessment  Last BM:  PTA  Height:   Ht Readings from Last 1 Encounters:  07/16/17 5\' 5"  (1.651 m)    Weight:  Wt Readings from Last 3 Encounters:  07/16/17 107 lb 1.6 oz (48.6 kg)  03/20/17 110 lb (49.9 kg)  03/18/17 110 lb (49.9 kg)  01/21/17 110lb   Ideal Body Weight:  56.8 kg  BMI:  Body mass index is 17.82 kg/m.  Estimated Nutritional Needs:   Kcal:  1400-1600  Protein:  60-70  Fluid:  1.4-1.6L   Sherelle Castelli, MS, Dietetic Intern Pager # [phone]

## 2017-07-17 ENCOUNTER — Inpatient Hospital Stay (HOSPITAL_COMMUNITY): Payer: Medicare Other

## 2017-07-17 ENCOUNTER — Encounter (HOSPITAL_COMMUNITY)
Admission: EM | Disposition: A | Discharge status: Home / Self Care | Payer: Self-pay | Source: Home / Self Care | Attending: Internal Medicine

## 2017-07-17 HISTORY — PX: VIDEO BRONCHOSCOPY: SHX5072

## 2017-07-17 LAB — BASIC METABOLIC PANEL
Anion gap: 8 (ref 5–15)
BUN: 19 mg/dL (ref 6–20)
CO2: 24 mmol/L (ref 22–32)
Calcium: 9 mg/dL (ref 8.9–10.3)
Chloride: 106 mmol/L (ref 101–111)
Creatinine, Ser: 1.36 mg/dL — ABNORMAL HIGH (ref 0.44–1.00)
GFR calc Af Amer: 42 mL/min — ABNORMAL LOW (ref >60)
GFR calc non Af Amer: 36 mL/min — ABNORMAL LOW (ref >60)
Glucose, Bld: 99 mg/dL (ref 65–99)
Potassium: 4.8 mmol/L (ref 3.5–5.1)
Sodium: 138 mmol/L (ref 135–145)

## 2017-07-17 SURGERY — VIDEO BRONCHOSCOPY WITHOUT FLUORO
Anesthesia: Moderate Sedation | Laterality: Bilateral

## 2017-07-17 MED ORDER — PHENYLEPHRINE HCL 0.25 % NA SOLN: 1.0000 | Freq: Four times a day (QID) | NASAL | Status: DC | PRN

## 2017-07-17 MED ORDER — BUTAMBEN-TETRACAINE-BENZOCAINE 2-2-14 % EX AERO: 1.0000 | INHALATION_SPRAY | Freq: Once | CUTANEOUS | Status: DC

## 2017-07-17 MED ORDER — PREDNISONE 20 MG PO TABS
20.0000 mg | ORAL_TABLET | Freq: Every day | ORAL | Status: DC
  Administered 2017-07-18: 20 mg via ORAL
  Filled 2017-07-17: qty 1

## 2017-07-17 MED ORDER — MIDAZOLAM HCL 5 MG/ML IJ SOLN
INTRAMUSCULAR | Status: AC
  Filled 2017-07-17: qty 2

## 2017-07-17 MED ORDER — MIDAZOLAM HCL 10 MG/2ML IJ SOLN
INTRAMUSCULAR | Status: DC | PRN
  Administered 2017-07-17 (×2): 1 mg via INTRAVENOUS

## 2017-07-17 MED ORDER — PHENYLEPHRINE HCL 0.25 % NA SOLN
NASAL | Status: DC | PRN
  Administered 2017-07-17: 2 via NASAL

## 2017-07-17 MED ORDER — SODIUM CHLORIDE 0.9 % IV SOLN
INTRAVENOUS | Status: DC
  Administered 2017-07-17: 08:00:00 via INTRAVENOUS

## 2017-07-17 MED ORDER — TRAMADOL HCL 50 MG PO TABS
100.0000 mg | ORAL_TABLET | Freq: Two times a day (BID) | ORAL | Status: DC | PRN
  Administered 2017-07-17: 100 mg via ORAL
  Filled 2017-07-17: qty 2

## 2017-07-17 MED ORDER — LIDOCAINE HCL 2 % EX GEL: 1.0000 "application " | Freq: Once | CUTANEOUS | Status: DC

## 2017-07-17 MED ORDER — FENTANYL CITRATE (PF) 100 MCG/2ML IJ SOLN
INTRAMUSCULAR | Status: AC
  Filled 2017-07-17: qty 4

## 2017-07-17 MED ORDER — LIDOCAINE HCL (PF) 1 % IJ SOLN
INTRAMUSCULAR | Status: DC | PRN
  Administered 2017-07-17: 6 mL

## 2017-07-17 MED ORDER — LIDOCAINE HCL 2 % EX GEL
CUTANEOUS | Status: DC | PRN
  Administered 2017-07-17: 1

## 2017-07-17 MED ORDER — FENTANYL CITRATE (PF) 100 MCG/2ML IJ SOLN
INTRAMUSCULAR | Status: DC | PRN
  Administered 2017-07-17 (×3): 25 ug via INTRAVENOUS

## 2017-07-17 NOTE — Procedures (Signed)
Bronchoscopy Procedure Note Deborah Shields [id number] 07/30/1940  Procedure: Bronchoscopy Indications: Diagnostic evaluation of the airways  Procedure Details Consent: Risks of procedure as well as the alternatives and risks of each were explained to the (patient/caregiver).  Consent for procedure obtained. Time Out: Verified patient identification, verified procedure, site/side was marked, verified correct patient position, special equipment/implants available, medications/allergies/relevent history reviewed, required imaging and test results available.  Performed  In preparation for procedure, bronchoscope lubricated. Upper airway anesthetized with lidocaine Sedation: versed 2 fent 75 mcg in divided doses  Airway entered and the following bronchi were examined: Blood visualised in left m,ainstem & lingular bronchus . A large blood clot was noted bobbing up & down with coughing. Parts of this was lavaged & suctioned out. No mass visible beyond it. LUL airways could be inspected but could not obtain full view of segmental lingular bronchi beyond clot inspite of multipke maneuvers to dislodge & aspirate clot. Procedures performed: BAL ,Brushings, endobronchial bx x 1 performed Bronchoscope removed.    Evaluation Hemodynamic Status: BP stable throughout; O2 sats: stable throughout Patient's Current Condition: stable Specimens:  Sent serosanguinous fluid Complications: No apparent complications Patient did tolerate procedure well.  Recommend - FU as outpatient & repeat bronchoscopy in 2 weeks  Rakesh V. Elsworth Soho 07/17/2017

## 2017-07-17 NOTE — Progress Notes (Signed)
Name: Deborah Shields MRN: [id number] DOB: 17-Oct-1940    ADMISSION DATE:  07/15/2017 CONSULTATION DATE:  07/17/2017   REFERRING MD : Tawanna Solo, [facility]  CHIEF COMPLAINT: Coughing up blood     HISTORY OF PRESENT ILLNESS: 77 year old remote smoker presented to the ED with history of coughing up blood for 1 day.  She reports an episode that woke her up from sleep and she felt the need to cough and saw about half a cup of frank blood with some clots she reports 2 more episodes since then.  This morning she only had some blood tinged sputum.  Sputum was otherwise clear.  SUBJECTIVE:  No more episodes of hemoptysis  VITAL SIGNS: Temp:  [97.8 F (36.6 C)-98.3 F (36.8 C)] 98.3 F (36.8 C) (01/24 0737) Pulse Rate:  [73-80] 73 (01/24 0422) Resp:  [9-25] 14 (01/24 0855) BP: (115-233)/(59-136) 173/90 (01/24 0855) SpO2:  [96 %-100 %] 100 % (01/24 0855)  PHYSICAL EXAMINATION: Gen. Pleasant, thin, elderly,, in no distress, normal affect ENT - no lesions, no post nasal drip Neck: No JVD, no thyromegaly, no carotid bruits Lungs: no use of accessory muscles, no dullness to percussion, clear without rales or rhonchi  Cardiovascular: Rhythm regular, heart sounds  normal, no murmurs or gallops, no peripheral edema Abdomen: soft and non-tender, no hepatosplenomegaly, BS normal. Musculoskeletal: No deformities, no cyanosis or clubbing Neuro:  alert, non focal   Recent Labs  Lab 07/15/17 1736 07/16/17 0352 07/17/17 0347  NA 138 139 138  K 3.9 3.0* 4.8  CL 102 105 106  CO2 26 24 24   BUN 28* 23* 19  CREATININE 1.36* 1.37* 1.36*  GLUCOSE 91 72 99   Recent Labs  Lab 07/15/17 1736 07/16/17 0352  HGB 11.9* 11.3*  HCT 36.2 34.6*  WBC 8.4 8.2  PLT 313 279   Dg Chest 2 View  Result Date: 07/15/2017 CLINICAL DATA:  Hemoptysis EXAM: CHEST  2 VIEW COMPARISON:  05/13/2016, 04/27/2015 FINDINGS: Hyperinflation. No focal pulmonary infiltrate, consolidation, or pleural effusion.  Cardiomediastinal silhouette within normal limits. Aortic atherosclerosis. No pneumothorax. Surgical clips in the right upper quadrant IMPRESSION: No active cardiopulmonary disease. Electronically Signed   By: Donavan Foil M.D.   On: 07/15/2017 17:59   Ct Angio Chest Pe W And/or Wo Contrast  Result Date: 07/15/2017 CLINICAL DATA:  76 year old with hemoptysis since this morning. Concern for pulmonary embolism. EXAM: CT ANGIOGRAPHY CHEST WITH CONTRAST TECHNIQUE: Multidetector CT imaging of the chest was performed using the standard protocol during bolus administration of intravenous contrast. Multiplanar CT image reconstructions and MIPs were obtained to evaluate the vascular anatomy. CONTRAST:  121mL ISOVUE-370 IOPAMIDOL (ISOVUE-370) INJECTION 76% COMPARISON:  Chest radiograph 07/15/2016 FINDINGS: Cardiovascular: Negative for pulmonary embolism. Negative for an aortic dissection. Great vessels are patent. Atherosclerotic calcifications in the thoracic aorta. Mild narrowing in the proximal left subclavian artery due to atherosclerotic disease. No significant pericardial fluid. Mediastinum/Nodes: No evidence for mediastinal, hilar or axillary lymphadenopathy. Proximal and mid esophagus is dilated with gas. Lungs/Pleura: The trachea and mainstem bronchi are patent. There is extensive scarring at both lung apices with air bronchograms along the medial right upper lobe. Patchy parenchymal and small nodular densities in both lungs suggesting inflammatory changes of uncertain age. There are endobronchial densities involving the airways of the lingula. Endobronchial densities could be related to hemoptysis. There is endobronchial material at the origin of the left upper lobe bronchus. No large pleural effusions. Upper Abdomen: Low-density structure in the left kidney upper pole is most  compatible with a cyst. No acute abnormality in upper abdomen. Musculoskeletal: Prominent calcifications along the posterior vertebral  bodies and disc at T6-T7. Marked disc space disease and endplate changes at X6-I6. Review of the MIP images confirms the above findings. IMPRESSION: Negative for a pulmonary embolism. Patchy parenchymal disease and small nodules. Findings are suggestive for an infectious or inflammatory process of uncertain age. There are endobronchial densities in the lingula and near the origin of the left upper lobe bronchus. This may be accounting for patient's hemoptysis. Extensive scarring at both lung apices. Mild dilatation of the esophagus. Aortic Atherosclerosis (ICD10-I70.0). Electronically Signed   By: Markus Daft M.D.   On: 07/15/2017 20:09    ASSESSMENT / PLAN:   Hemoptysis with suggestion of endobronchial lesion in the lingular bronchus -Mild bronchiectasis noted which may be the cause. -No preceding symptoms to suggest acute bronchitis  Recommend- inspection bronchoscopy today  Kara Mead MD. FCCP. [facility] Pager [phone] If no response call [phone]     Addendum - no endobronchial mass noted Large blood clot inlingular bronchus which could not be fully aspirated. FU as outpatient & repeat bronchoscopy in 2 weeks  Rakesh V. Elsworth Soho MD 07/17/2017, 8:58 AM

## 2017-07-17 NOTE — Progress Notes (Signed)
PROGRESS NOTE    Deborah Shields  CBJ:[id number] DOB: Apr 13, 1941 DOA: 07/15/2017 PCP: Jani Gravel, MD   Brief Narrative: Patient is a [age]-year-old female with past medical history of hypertension, paroxysmal A. fib, CKD, chronic anemia, hypothyroidism who presented to the emergency department with hemoptysis.  Patient was admitted for further evaluation.  CT angiogram of the chest showed patchy parenchymal involvement concerning for infectious versus inflammatory process. hemoptysis suspected from pneumonia.  Currently on IV antibiotics.  Hemoptysis stopped currently.  Assessment & Plan:   Principal Problem:   Hemoptysis Active Problems:   Hypothyroidism   HYPERLIPIDEMIA   Essential hypertension   Chronic renal insufficiency, stage III (moderate) (HCC)   PAF (paroxysmal atrial fibrillation) (HCC)   Malnutrition of moderate degree   Hemoptysis: Resolved. S/P bronchoscopy today. Report/pulmonary recommendation: no endobronchial mass noted Large blood clot inlingular bronchus which could not be fully aspirated. FU as outpatient & repeat bronchoscopy in 2 weeks   At present we will continue ceftriaxone and azithromycin. Patient also reported of unintentional 40 pounds weight loss since last year. Currently she is afebrile. No history of contact with tuberculosis patient.  No history of travel outside  Left great toe pain: Concerning for gout.  Uric acid level is elevated.  Patient was on Uloric last month which had been stopped due to angioedema. Patient might be need to be started on allopurinol .  This will be done as per her primary care physician. Medial side of the left great toe was slightly erythematous and tender.  Will start on short course of prednisone.  Anemia: Most likely chronic.  H&H stable.  Chronic kidney disease stage III: Currently kidney function on baseline.  Hypertension: Currently blood pressure stable.  Continue Toprol.  Hypothyroidism:Continue  synthyroid.  History of paroxysmal A. HYW:VPXTGGYIR rate is controlled.  Only one episode as per the cardiology note.  Not on anticoagulation.  She is on aspirin at home which is on hold.  Will resume on discharge.  Hypokalemia: Supplemented and corrected.   DVT prophylaxis:SCD Code Status: Full Family Communication: Daughter present at the bedside Disposition Plan: Tomorrow to home   Consultants: P CCM  Procedures: None  Antimicrobials: Ceftriaxone and azithromycin since 07/15/17.  Subjective: Patient seen and examined the bedside this morning.  Remains comfortable.  Just came after the bronchoscopy.  Respiratory status stable. Objective: Vitals:   07/17/17 0910 07/17/17 0915 07/17/17 0920 07/17/17 0925  BP: (!) 170/94 (!) 168/95 (!) 157/94   Pulse:      Resp: 13 11 14 16   Temp:      TempSrc:      SpO2: 97% 96% 97% 99%  Weight:      Height:        Intake/Output Summary (Last 24 hours) at 07/17/2017 1208 Last data filed at 07/17/2017 4854 Gross per 24 hour  Intake 100 ml  Output -  Net 100 ml   Filed Weights   07/15/17 1557 07/16/17 0220  Weight: 47.2 kg (104 lb) 48.6 kg (107 lb 1.6 oz)    Examination:  General exam: Appears calm and comfortable ,Not in distress,average built Respiratory system: Bilateral equal air entry, normal vesicular breath sounds, no wheezes or crackles  Cardiovascular system: S1 & S2 heard, RRR. No JVD, murmurs, rubs, gallops or clicks. No pedal edema. Gastrointestinal system: Abdomen is nondistended, soft and nontender. No organomegaly or masses felt. Normal bowel sounds heard. Central nervous system: Alert and oriented. No focal neurological deficits. Extremities: No edema, no clubbing ,no  cyanosis, distal peripheral pulses palpable. Mild swelling/erythema medical side of the left great toe. Skin: No cyanosis,No pallor,No Rash,No Ulcer Psychiatry: Judgement and insight appear normal. Mood & affect appropriate.    Data Reviewed: I have  personally reviewed following labs and imaging studies  CBC: Recent Labs  Lab 07/15/17 1736 07/16/17 0352  WBC 8.4 8.2  NEUTROABS 5.5  --   HGB 11.9* 11.3*  HCT 36.2 34.6*  MCV 92.3 91.3  PLT 313 211   Basic Metabolic Panel: Recent Labs  Lab 07/15/17 1736 07/16/17 0352 07/17/17 0347  NA 138 139 138  K 3.9 3.0* 4.8  CL 102 105 106  CO2 26 24 24   GLUCOSE 91 72 99  BUN 28* 23* 19  CREATININE 1.36* 1.37* 1.36*  CALCIUM 9.9 9.1 9.0   GFR: Estimated Creatinine Clearance: 26.2 mL/min (A) (by C-G formula based on SCr of 1.36 mg/dL (H)). Liver Function Tests: Recent Labs  Lab 07/15/17 1736  AST 30  ALT 17  ALKPHOS 106  BILITOT 0.9  PROT 7.5  ALBUMIN 3.7   No results for input(s): LIPASE, AMYLASE in the last 168 hours. No results for input(s): AMMONIA in the last 168 hours. Coagulation Profile: Recent Labs  Lab 07/15/17 1736  INR 0.97   Cardiac Enzymes: No results for input(s): CKTOTAL, CKMB, CKMBINDEX, TROPONINI in the last 168 hours. BNP (last 3 results) No results for input(s): PROBNP in the last 8760 hours. HbA1C: No results for input(s): HGBA1C in the last 72 hours. CBG: No results for input(s): GLUCAP in the last 168 hours. Lipid Profile: No results for input(s): CHOL, HDL, LDLCALC, TRIG, CHOLHDL, LDLDIRECT in the last 72 hours. Thyroid Function Tests: No results for input(s): TSH, T4TOTAL, FREET4, T3FREE, THYROIDAB in the last 72 hours. Anemia Panel: No results for input(s): VITAMINB12, FOLATE, FERRITIN, TIBC, IRON, RETICCTPCT in the last 72 hours. Sepsis Labs: No results for input(s): PROCALCITON, LATICACIDVEN in the last 168 hours.  Recent Results (from the past 240 hour(s))  MRSA PCR Screening     Status: None   Collection Time: 07/16/17 10:07 AM  Result Value Ref Range Status   MRSA by PCR NEGATIVE NEGATIVE Final    Comment:        The GeneXpert MRSA Assay (FDA approved for NASAL specimens only), is one component of a comprehensive MRSA  colonization surveillance program. It is not intended to diagnose MRSA infection nor to guide or monitor treatment for MRSA infections.   Culture, sputum-assessment     Status: None   Collection Time: 07/16/17 11:39 AM  Result Value Ref Range Status   Specimen Description SPUTUM  Final   Special Requests NONE  Final   Sputum evaluation THIS SPECIMEN IS ACCEPTABLE FOR SPUTUM CULTURE  Final   Report Status 07/16/2017 FINAL  Final  Culture, respiratory (NON-Expectorated)     Status: None (Preliminary result)   Collection Time: 07/16/17 11:39 AM  Result Value Ref Range Status   Specimen Description SPUTUM  Final   Special Requests NONE Reflexed from [id number]  Final   Gram Stain   Final    FEW WBC PRESENT, PREDOMINANTLY PMN RARE YEAST RARE GRAM POSITIVE COCCI IN PAIRS FEW SQUAMOUS EPITHELIAL CELLS PRESENT    Culture   Final    CULTURE REINCUBATED FOR BETTER GROWTH Performed at [facility], [place] [address], [place], [place] [address]    Report Status PENDING  Incomplete         Radiology Studies: Dg Chest 2 View  Result Date: 07/15/2017 CLINICAL DATA:  Hemoptysis EXAM: CHEST  2 VIEW COMPARISON:  05/13/2016, 04/27/2015 FINDINGS: Hyperinflation. No focal pulmonary infiltrate, consolidation, or pleural effusion. Cardiomediastinal silhouette within normal limits. Aortic atherosclerosis. No pneumothorax. Surgical clips in the right upper quadrant IMPRESSION: No active cardiopulmonary disease. Electronically Signed   By: Donavan Foil M.D.   On: 07/15/2017 17:59   Ct Angio Chest Pe W And/or Wo Contrast  Result Date: 07/15/2017 CLINICAL DATA:  77 year old with hemoptysis since this morning. Concern for pulmonary embolism. EXAM: CT ANGIOGRAPHY CHEST WITH CONTRAST TECHNIQUE: Multidetector CT imaging of the chest was performed using the standard protocol during bolus administration of intravenous contrast. Multiplanar CT image reconstructions and MIPs were obtained to evaluate the  vascular anatomy. CONTRAST:  157mL ISOVUE-370 IOPAMIDOL (ISOVUE-370) INJECTION 76% COMPARISON:  Chest radiograph 07/15/2016 FINDINGS: Cardiovascular: Negative for pulmonary embolism. Negative for an aortic dissection. Great vessels are patent. Atherosclerotic calcifications in the thoracic aorta. Mild narrowing in the proximal left subclavian artery due to atherosclerotic disease. No significant pericardial fluid. Mediastinum/Nodes: No evidence for mediastinal, hilar or axillary lymphadenopathy. Proximal and mid esophagus is dilated with gas. Lungs/Pleura: The trachea and mainstem bronchi are patent. There is extensive scarring at both lung apices with air bronchograms along the medial right upper lobe. Patchy parenchymal and small nodular densities in both lungs suggesting inflammatory changes of uncertain age. There are endobronchial densities involving the airways of the lingula. Endobronchial densities could be related to hemoptysis. There is endobronchial material at the origin of the left upper lobe bronchus. No large pleural effusions. Upper Abdomen: Low-density structure in the left kidney upper pole is most compatible with a cyst. No acute abnormality in upper abdomen. Musculoskeletal: Prominent calcifications along the posterior vertebral bodies and disc at T6-T7. Marked disc space disease and endplate changes at T0-G2. Review of the MIP images confirms the above findings. IMPRESSION: Negative for a pulmonary embolism. Patchy parenchymal disease and small nodules. Findings are suggestive for an infectious or inflammatory process of uncertain age. There are endobronchial densities in the lingula and near the origin of the left upper lobe bronchus. This may be accounting for patient's hemoptysis. Extensive scarring at both lung apices. Mild dilatation of the esophagus. Aortic Atherosclerosis (ICD10-I70.0). Electronically Signed   By: Markus Daft M.D.   On: 07/15/2017 20:09        Scheduled Meds: .  levothyroxine  88 mcg Oral QAC breakfast  . LORazepam  0.5 mg Oral QHS  . metoprolol succinate  50 mg Oral Daily  . pantoprazole  40 mg Oral Daily   Continuous Infusions: . sodium chloride 10 mL/hr at 07/17/17 0741  . azithromycin 500 mg (07/16/17 2132)  . cefTRIAXone (ROCEPHIN)  IV 1 g (07/16/17 2132)     LOS: 1 day    Time spent:     Marene Lenz, MD [facility] Pager [phone]  If 7PM-7AM, please contact night-coverage [email or web address] Password [facility] 07/17/2017, 12:08 PM

## 2017-07-17 NOTE — Progress Notes (Signed)
Video bronchoscopy performed Intervention bronchial washings Intervention bronchial brushings Intervention bronchial biopsy Pt tolerated well  Jovee Dettinger David RRT  

## 2017-07-18 ENCOUNTER — Encounter (HOSPITAL_COMMUNITY): Payer: Self-pay | Admitting: Pulmonary Disease

## 2017-07-18 LAB — CULTURE, RESPIRATORY W GRAM STAIN: Culture: NORMAL

## 2017-07-18 LAB — LEGIONELLA PNEUMOPHILA SEROGP 1 UR AG: L. pneumophila Serogp 1 Ur Ag: NEGATIVE

## 2017-07-18 MED ORDER — PREDNISONE 20 MG PO TABS: 20.0000 mg | ORAL_TABLET | Freq: Every day | ORAL | 0 refills | Status: DC

## 2017-07-18 MED ORDER — CEFDINIR 300 MG PO CAPS: 300.0000 mg | ORAL_CAPSULE | Freq: Two times a day (BID) | ORAL | 0 refills | Status: AC

## 2017-07-18 NOTE — Discharge Summary (Signed)
Physician Discharge Summary  Deborah Shields XBW:[id number] DOB: 11-07-1940 DOA: 07/15/2017  PCP: Jani Gravel, MD  Admit date: 07/15/2017 Discharge date: 07/18/2017  Admitted From: Home Disposition:  Home   Discharge Condition: Stable CODE STATUS:Full  Diet recommendation: Heart Healthy   Brief/Interim Summary: Patient is a [age]-year-old female with past medical history of hypertension, paroxysmal A. fib, CKD, chronic anemia, hypothyroidism who presented to the emergency department with hemoptysis.  Patient was admitted for further evaluation.  CT angiogram of the chest showed patchy parenchymal involvement concerning for infectious versus inflammatory process. hemoptysis suspected from pneumonia.  She was started  on IV antibiotics.  Hemoptysis stopped after the admission.  He was evaluated by pulmonology and she underwent bronchoscopy here. Patient's respiratory status is currently stable.  Hemoptysis has resolved.  She has been discharged today to home.  She will follow-up with pulmonary as an outpatient.  Appointment has already been arranged.  Following problems were addressed during hospitalization:  Hemoptysis: Resolved. S/P bronchoscopy on 07/17/17. Report/pulmonary recommendation: no endobronchial mass noted Large blood clot inlingular bronchus which could not be fully aspirated. FU as outpatient & repeat bronchoscopy in 2 weeks  She was started  On  ceftriaxone and azithromycin on admission. Will be discharged on omnicef. Currently she is afebrile. No history of contact with tuberculosis patient.  No history of travel outside  Left great toe pain: Concerning for gout.  Uric acid level is elevated.  Patient was on Uloric last month which had been stopped due to angioedema. Patient might be need to be started on allopurinol .  This will be done as per her primary care physician. Medial side of the left great toe was slightly erythematous and tender.  Will start on short course of  prednisone.  Anemia: Most likely chronic.  H&H stable.  Chronic kidney disease stage III: Currently kidney function on baseline.  Hypertension: Currently blood pressure stable.  Continue Toprol.  Hypothyroidism:Continue synthyroid.  History of paroxysmal A. BUL:AGTXMIWOE rate is controlled.  Only one episode as per the cardiology note.  Not on anticoagulation.  She is on aspirin at home which is on hold.  Will resume on discharge.  Hypokalemia: Supplemented and corrected.       Discharge Diagnoses:  Principal Problem:   Hemoptysis Active Problems:   Hypothyroidism   HYPERLIPIDEMIA   Essential hypertension   Chronic renal insufficiency, stage III (moderate) (HCC)   PAF (paroxysmal atrial fibrillation) (HCC)   Malnutrition of moderate degree    Discharge Instructions  Discharge Instructions    Diet - low sodium heart healthy   Complete by:  As directed    Discharge instructions   Complete by:  As directed    1) Follow up with your PCP within a week. 2) Follow up with pulmonology as an outpatient. Name and number of the provider has been attached.   Increase activity slowly   Complete by:  As directed      Allergies as of 07/18/2017      Reactions   Aspirin Nausea Only, Other (See Comments)   REACTION: GI upset  ( pt can take 81 mg but NOT   325 mg ASA )   Lovastatin Nausea Only, Other (See Comments)   REACTION: nausea   Benazepril Hcl Other (See Comments)   Unknown   Paroxetine Other (See Comments)   Unknown   Bupropion Hcl Other (See Comments)   REACTION: tinnitis   Ezetimibe Nausea And Vomiting, Other (See Comments)   REACTION: GI  symptoms   Fenofibrate Other (See Comments)   Myalgia   Pravastatin Sodium Other (See Comments)   REACTION: elevated CPK - Muscle's in bilateral Leg      Medication List    STOP taking these medications   febuxostat 40 MG tablet Commonly known as:  ULORIC   metoprolol tartrate 25 MG tablet Commonly known as:   LOPRESSOR     TAKE these medications   aspirin 81 MG tablet Take 81 mg by mouth at bedtime.   b complex vitamins tablet Take 1 tablet by mouth daily after supper.   bisacodyl 5 MG EC tablet Commonly known as:  DULCOLAX Take 5 mg by mouth daily as needed for moderate constipation.   cefdinir 300 MG capsule Commonly known as:  OMNICEF Take 1 capsule (300 mg total) by mouth 2 (two) times daily for 5 days.   CENTRUM SILVER PO Take 1 tablet by mouth daily after supper.   cyclobenzaprine 10 MG tablet Commonly known as:  FLEXERIL Take 10 mg by mouth daily as needed for muscle spasms.   diclofenac sodium 1 % Gel Commonly known as:  VOLTAREN Apply 1 application topically as needed (for pain).   FIBER PO Take 1 tablet by mouth daily.   HYDROcodone-acetaminophen 5-325 MG tablet Commonly known as:  NORCO/VICODIN Take 1 tablet by mouth every 4 (four) hours as needed (pain).   LORazepam 0.5 MG tablet Commonly known as:  ATIVAN Take 1 tablet (0.5 mg total) by mouth 3 (three) times daily as needed. What changed:  when to take this   meclizine 25 MG tablet Commonly known as:  ANTIVERT Take 1 tablet (25 mg total) by mouth daily. 1/2 every 8 hrs prn for imbalance What changed:    how much to take  when to take this  additional instructions   metoprolol succinate 50 MG 24 hr tablet Commonly known as:  TOPROL-XL Take 50 mg by mouth daily.   pantoprazole 40 MG tablet Commonly known as:  PROTONIX Take 40 mg by mouth daily.   predniSONE 20 MG tablet Commonly known as:  DELTASONE Take 1 tablet (20 mg total) by mouth daily with breakfast. Start taking on:  07/19/2017   psyllium 0.52 g capsule Commonly known as:  REGULOID Take 0.52 g by mouth daily. FIBER   SYNTHROID 88 MCG tablet Generic drug:  levothyroxine Take 88 mcg by mouth daily before breakfast. Taking the brand "synthroid"   traMADol 50 MG tablet Commonly known as:  ULTRAM 1/2-1 by mouth once daily as  needed. What changed:    how much to take  how to take this  when to take this  reasons to take this  additional instructions      Follow-up Information    Rigoberto Noel, MD Follow up on 08/26/2017.   Specialty:  Pulmonary Disease Why:  Appt at 10:30 am Contact information: [address] [address] [phone]        Jani Gravel, MD. Schedule an appointment as soon as possible for a visit in 1 week(s).   Specialty:  Internal Medicine Contact information: [place] [phone]          Allergies  Allergen Reactions  . Aspirin Nausea Only and Other (See Comments)    REACTION: GI upset  ( pt can take 81 mg but NOT   325 mg ASA )  . Lovastatin Nausea Only and Other (See Comments)    REACTION: nausea  .  Benazepril Hcl Other (See Comments)    Unknown  . Paroxetine Other (See Comments)    Unknown  . Bupropion Hcl Other (See Comments)    REACTION: tinnitis  . Ezetimibe Nausea And Vomiting and Other (See Comments)    REACTION: GI symptoms  . Fenofibrate Other (See Comments)    Myalgia   . Pravastatin Sodium Other (See Comments)    REACTION: elevated CPK - Muscle's in bilateral Leg    Consultations: Pulmonary  Procedures/Studies: Dg Chest 2 View  Result Date: 07/15/2017 CLINICAL DATA:  Hemoptysis EXAM: CHEST  2 VIEW COMPARISON:  05/13/2016, 04/27/2015 FINDINGS: Hyperinflation. No focal pulmonary infiltrate, consolidation, or pleural effusion. Cardiomediastinal silhouette within normal limits. Aortic atherosclerosis. No pneumothorax. Surgical clips in the right upper quadrant IMPRESSION: No active cardiopulmonary disease. Electronically Signed   By: Donavan Foil M.D.   On: 07/15/2017 17:59   Ct Angio Chest Pe W And/or Wo Contrast  Result Date: 07/15/2017 CLINICAL DATA:  77 year old with hemoptysis since this morning. Concern for pulmonary embolism. EXAM: CT ANGIOGRAPHY CHEST WITH CONTRAST TECHNIQUE:  Multidetector CT imaging of the chest was performed using the standard protocol during bolus administration of intravenous contrast. Multiplanar CT image reconstructions and MIPs were obtained to evaluate the vascular anatomy. CONTRAST:  130m ISOVUE-370 IOPAMIDOL (ISOVUE-370) INJECTION 76% COMPARISON:  Chest radiograph 07/15/2016 FINDINGS: Cardiovascular: Negative for pulmonary embolism. Negative for an aortic dissection. Great vessels are patent. Atherosclerotic calcifications in the thoracic aorta. Mild narrowing in the proximal left subclavian artery due to atherosclerotic disease. No significant pericardial fluid. Mediastinum/Nodes: No evidence for mediastinal, hilar or axillary lymphadenopathy. Proximal and mid esophagus is dilated with gas. Lungs/Pleura: The trachea and mainstem bronchi are patent. There is extensive scarring at both lung apices with air bronchograms along the medial right upper lobe. Patchy parenchymal and small nodular densities in both lungs suggesting inflammatory changes of uncertain age. There are endobronchial densities involving the airways of the lingula. Endobronchial densities could be related to hemoptysis. There is endobronchial material at the origin of the left upper lobe bronchus. No large pleural effusions. Upper Abdomen: Low-density structure in the left kidney upper pole is most compatible with a cyst. No acute abnormality in upper abdomen. Musculoskeletal: Prominent calcifications along the posterior vertebral bodies and disc at T6-T7. Marked disc space disease and endplate changes at LC7-E9 Review of the MIP images confirms the above findings. IMPRESSION: Negative for a pulmonary embolism. Patchy parenchymal disease and small nodules. Findings are suggestive for an infectious or inflammatory process of uncertain age. There are endobronchial densities in the lingula and near the origin of the left upper lobe bronchus. This may be accounting for patient's hemoptysis.  Extensive scarring at both lung apices. Mild dilatation of the esophagus. Aortic Atherosclerosis (ICD10-I70.0). Electronically Signed   By: AMarkus DaftM.D.   On: 07/15/2017 20:09   Mm Screening Breast Tomo Bilateral  Result Date: 07/03/2017 CLINICAL DATA:  Screening. EXAM: 2D DIGITAL SCREENING BILATERAL MAMMOGRAM WITH 3D TOMO WITH CAD COMPARISON:  Previous exam(s). ACR Breast Density Category b: There are scattered areas of fibroglandular density. FINDINGS: There are no findings suspicious for malignancy. Images were processed with CAD. IMPRESSION: No mammographic evidence of malignancy. A result letter of this screening mammogram will be mailed directly to the patient. RECOMMENDATION: Screening mammogram in one year. (Code:SM-B-01Y) BI-RADS CATEGORY  1: Negative. Electronically Signed   By: SClaudie ReveringM.D.   On: 07/03/2017 14:28   Bronchoscopy   Subjective: Patient seen and examined the bedside this morning.  Remains comfortable.  Denies any complaints.  Hemoptysis has completely resolved.  Patient is stable to be discharged to home.  Discharge Exam: Vitals:   07/17/17 2150 07/18/17 0545  BP: (!) 144/85 132/83  Pulse: 79 79  Resp: 20 16  Temp: (!) 97.5 F (36.4 C) 98.4 F (36.9 C)  SpO2: 99% 97%   Vitals:   07/17/17 0925 07/17/17 1915 07/17/17 2150 07/18/17 0545  BP:  140/80 (!) 144/85 132/83  Pulse:  80 79 79  Resp: _0 Temp:  98.1 F (36.7 C) (!) 97.5 F (36.4 C) 98.4 F (36.9 C)  TempSrc:  Oral Oral Oral  SpO2: 99% 98% 99% 97%  Weight:      Height:        General: Pt is alert, awake, not in acute distress Cardiovascular: RRR, S1/S2 +, no rubs, no gallops Respiratory: CTA bilaterally, no wheezing, no rhonchi Abdominal: Soft, NT, ND, bowel sounds + Extremities: no edema, no cyanosis    The results of significant diagnostics from this hospitalization (including imaging, microbiology, ancillary and laboratory) are listed below for reference.      Microbiology: Recent Results (from the past 240 hour(s))  MRSA PCR Screening     Status: None   Collection Time: 07/16/17 10:07 AM  Result Value Ref Range Status   MRSA by PCR NEGATIVE NEGATIVE Final    Comment:        The GeneXpert MRSA Assay (FDA approved for NASAL specimens only), is one component of a comprehensive MRSA colonization surveillance program. It is not intended to diagnose MRSA infection nor to guide or monitor treatment for MRSA infections.   Culture, sputum-assessment     Status: None   Collection Time: 07/16/17 11:39 AM  Result Value Ref Range Status   Specimen Description SPUTUM  Final   Special Requests NONE  Final   Sputum evaluation THIS SPECIMEN IS ACCEPTABLE FOR SPUTUM CULTURE  Final   Report Status 07/16/2017 FINAL  Final  Culture, respiratory (NON-Expectorated)     Status: None (Preliminary result)   Collection Time: 07/16/17 11:39 AM  Result Value Ref Range Status   Specimen Description SPUTUM  Final   Special Requests NONE Reflexed from [id number]  Final   Gram Stain   Final    FEW WBC PRESENT, PREDOMINANTLY PMN RARE YEAST RARE GRAM POSITIVE COCCI IN PAIRS FEW SQUAMOUS EPITHELIAL CELLS PRESENT    Culture   Final    Consistent with normal respiratory flora. Performed at [facility], [place] [address], [place], [place] [address]    Report Status PENDING  Incomplete  Culture, bal-quantitative     Status: None (Preliminary result)   Collection Time: 07/17/17  8:30 AM  Result Value Ref Range Status   Specimen Description BRONCHIAL ALVEOLAR LAVAGE  Final   Special Requests NONE  Final   Gram Stain NO WBC SEEN NO ORGANISMS SEEN   Final   Culture   Final    CULTURE REINCUBATED FOR BETTER GROWTH Performed at [facility], [place] [address], [place], [place] [address]    Report Status PENDING  Incomplete     Labs: BNP (last 3 results) No results for input(s): BNP in the last 8760 hours. Basic Metabolic Panel: Recent Labs  Lab  07/15/17 1736 07/16/17 0352 07/17/17 0347  NA 138 139 138  K 3.9 3.0* 4.8  CL 102 105 106  CO2 _1 GLUCOSE 91 72 99  BUN 28* 23* 19  CREATININE 1.36* 1.37* 1.36*  CALCIUM 9.9 9.1 9.0   Liver Function Tests: Recent Labs  Lab 07/15/17 1736  AST 30  ALT 17  ALKPHOS 106  BILITOT 0.9  PROT 7.5  ALBUMIN 3.7   No results for input(s): LIPASE, AMYLASE in the last 168 hours. No results for input(s): AMMONIA in the last 168 hours. CBC: Recent Labs  Lab 07/15/17 1736 07/16/17 0352  WBC 8.4 8.2  NEUTROABS 5.5  --   HGB 11.9* 11.3*  HCT 36.2 34.6*  MCV 92.3 91.3  PLT 313 279   Cardiac Enzymes: No results for input(s): CKTOTAL, CKMB, CKMBINDEX, TROPONINI in the last 168 hours. BNP: Invalid input(s): POCBNP CBG: No results for input(s): GLUCAP in the last 168 hours. D-Dimer No results for input(s): DDIMER in the last 72 hours. Hgb A1c No results for input(s): HGBA1C in the last 72 hours. Lipid Profile No results for input(s): CHOL, HDL, LDLCALC, TRIG, CHOLHDL, LDLDIRECT in the last 72 hours. Thyroid function studies No results for input(s): TSH, T4TOTAL, T3FREE, THYROIDAB in the last 72 hours.  Invalid input(s): FREET3 Anemia work up No results for input(s): VITAMINB12, FOLATE, FERRITIN, TIBC, IRON, RETICCTPCT in the last 72 hours. Urinalysis    Component Value Date/Time   COLORURINE YELLOW 11/21/2015 [place] 11/21/2015 1156   LABSPEC <=1.005 (A) 11/21/2015 1156   PHURINE 6.5 11/21/2015 1156   GLUCOSEU NEGATIVE 11/21/2015 1156   HGBUR NEGATIVE 11/21/2015 [place] 11/21/2015 1156   BILIRUBINUR Neg 09/07/2012 1426   KETONESUR NEGATIVE 11/21/2015 1156   PROTEINUR NEGATIVE 06/02/2014 1515   UROBILINOGEN 0.2 11/21/2015 1156   NITRITE NEGATIVE 11/21/2015 1156   LEUKOCYTESUR NEGATIVE 11/21/2015 1156   Sepsis Labs Invalid input(s): PROCALCITONIN,  WBC,  LACTICIDVEN Microbiology Recent Results (from the past 240 hour(s))   MRSA PCR Screening     Status: None   Collection Time: 07/16/17 10:07 AM  Result Value Ref Range Status   MRSA by PCR NEGATIVE NEGATIVE Final    Comment:        The GeneXpert MRSA Assay (FDA approved for NASAL specimens only), is one component of a comprehensive MRSA colonization surveillance program. It is not intended to diagnose MRSA infection nor to guide or monitor treatment for MRSA infections.   Culture, sputum-assessment     Status: None   Collection Time: 07/16/17 11:39 AM  Result Value Ref Range Status   Specimen Description SPUTUM  Final   Special Requests NONE  Final   Sputum evaluation THIS SPECIMEN IS ACCEPTABLE FOR SPUTUM CULTURE  Final   Report Status 07/16/2017 FINAL  Final  Culture, respiratory (NON-Expectorated)     Status: None (Preliminary result)   Collection Time: 07/16/17 11:39 AM  Result Value Ref Range Status   Specimen Description SPUTUM  Final   Special Requests NONE Reflexed from [id number]  Final   Gram Stain   Final    FEW WBC PRESENT, PREDOMINANTLY PMN RARE YEAST RARE GRAM POSITIVE COCCI IN PAIRS FEW SQUAMOUS EPITHELIAL CELLS PRESENT    Culture   Final    Consistent with normal respiratory flora. Performed at [facility], [place] [address] [place], [place] [address]    Report Status PENDING  Incomplete  Culture, bal-quantitative     Status: None (Preliminary result)   Collection Time: 07/17/17  8:30 AM  Result Value Ref Range Status   Specimen Description BRONCHIAL ALVEOLAR LAVAGE  Final   Special Requests NONE  Final   Gram Stain  NO WBC SEEN NO ORGANISMS SEEN   Final   Culture   Final    CULTURE REINCUBATED FOR BETTER GROWTH Performed at [facility], [place] [address] [place], [place] [address]    Report Status PENDING  Incomplete     Time coordinating discharge: Over 30 minutes  SIGNED:   Marene Lenz, MD  [facility] 07/18/2017, 10:46 AM Pager [phone]  If 7PM-7AM, please contact  night-coverage [email or web address] Password [email or web address]

## 2017-07-20 LAB — CULTURE, BAL-QUANTITATIVE W GRAM STAIN
Culture: 2000 — AB
Gram Stain: NONE SEEN

## 2017-07-21 LAB — ACID FAST SMEAR (AFB, MYCOBACTERIA): Acid Fast Smear: NEGATIVE

## 2017-07-24 DIAGNOSIS — H04222 Epiphora due to insufficient drainage, left lacrimal gland: Secondary | ICD-10-CM | POA: Diagnosis not present

## 2017-07-24 DIAGNOSIS — H04223 Epiphora due to insufficient drainage, bilateral lacrimal glands: Secondary | ICD-10-CM | POA: Diagnosis not present

## 2017-07-24 DIAGNOSIS — H17811 Minor opacity of cornea, right eye: Secondary | ICD-10-CM | POA: Diagnosis not present

## 2017-07-24 DIAGNOSIS — H04541 Stenosis of right lacrimal canaliculi: Secondary | ICD-10-CM | POA: Diagnosis not present

## 2017-07-24 DIAGNOSIS — H04221 Epiphora due to insufficient drainage, right lacrimal gland: Secondary | ICD-10-CM | POA: Diagnosis not present

## 2017-07-24 DIAGNOSIS — H04123 Dry eye syndrome of bilateral lacrimal glands: Secondary | ICD-10-CM | POA: Diagnosis not present

## 2017-07-24 DIAGNOSIS — H04552 Acquired stenosis of left nasolacrimal duct: Secondary | ICD-10-CM | POA: Diagnosis not present

## 2017-07-24 DIAGNOSIS — H04551 Acquired stenosis of right nasolacrimal duct: Secondary | ICD-10-CM | POA: Diagnosis not present

## 2017-07-24 DIAGNOSIS — H04563 Stenosis of bilateral lacrimal punctum: Secondary | ICD-10-CM | POA: Diagnosis not present

## 2017-07-24 DIAGNOSIS — H04543 Stenosis of bilateral lacrimal canaliculi: Secondary | ICD-10-CM | POA: Diagnosis not present

## 2017-07-25 ENCOUNTER — Other Ambulatory Visit: Payer: Self-pay

## 2017-07-25 DIAGNOSIS — R0989 Other specified symptoms and signs involving the circulatory and respiratory systems: Secondary | ICD-10-CM

## 2017-07-28 DIAGNOSIS — E039 Hypothyroidism, unspecified: Secondary | ICD-10-CM | POA: Diagnosis not present

## 2017-07-28 DIAGNOSIS — I10 Essential (primary) hypertension: Secondary | ICD-10-CM | POA: Diagnosis not present

## 2017-07-28 DIAGNOSIS — Z09 Encounter for follow-up examination after completed treatment for conditions other than malignant neoplasm: Secondary | ICD-10-CM | POA: Diagnosis not present

## 2017-07-28 DIAGNOSIS — F419 Anxiety disorder, unspecified: Secondary | ICD-10-CM | POA: Diagnosis not present

## 2017-07-31 DIAGNOSIS — N183 Chronic kidney disease, stage 3 (moderate): Secondary | ICD-10-CM | POA: Diagnosis not present

## 2017-07-31 DIAGNOSIS — E039 Hypothyroidism, unspecified: Secondary | ICD-10-CM | POA: Diagnosis not present

## 2017-07-31 DIAGNOSIS — Z79899 Other long term (current) drug therapy: Secondary | ICD-10-CM | POA: Diagnosis not present

## 2017-08-20 ENCOUNTER — Ambulatory Visit (HOSPITAL_COMMUNITY)
Admission: RE | Admit: 2017-08-20 | Discharge: 2017-08-20 | Disposition: A | Discharge status: Home / Self Care | Payer: Medicare Other | Source: Ambulatory Visit | Attending: Vascular Surgery | Admitting: Vascular Surgery

## 2017-08-20 DIAGNOSIS — I739 Peripheral vascular disease, unspecified: Secondary | ICD-10-CM | POA: Insufficient documentation

## 2017-08-20 DIAGNOSIS — R0989 Other specified symptoms and signs involving the circulatory and respiratory systems: Secondary | ICD-10-CM

## 2017-08-21 LAB — FUNGUS CULTURE WITH STAIN

## 2017-08-21 LAB — FUNGAL ORGANISM REFLEX

## 2017-08-21 LAB — FUNGUS CULTURE RESULT

## 2017-08-25 ENCOUNTER — Encounter: Payer: Self-pay | Admitting: Vascular Surgery

## 2017-08-25 ENCOUNTER — Ambulatory Visit (INDEPENDENT_AMBULATORY_CARE_PROVIDER_SITE_OTHER): Payer: Medicare Other | Admitting: Vascular Surgery

## 2017-08-25 VITALS — BP 125/80 | HR 71 | Resp 18 | Ht 65.0 in | Wt 110.0 lb

## 2017-08-25 DIAGNOSIS — I739 Peripheral vascular disease, unspecified: Secondary | ICD-10-CM | POA: Diagnosis not present

## 2017-08-25 NOTE — Progress Notes (Signed)
Referring Physician: Dr Jani Gravel  Patient name: Deborah Shields MRN: [id number] DOB: 01/20/1941 Sex: female  REASON FOR CONSULT: Pain in feet  HPI: Deborah Shields is a 77 y.o. female we have followed for several years for bilateral moderate carotid stenosis fibromuscular dysplasia.  She presents today with complaints of pain in both feet.  Her rheumatologist and primary care physician were concerned that there was an arterial occlusive disease component.  She denies claudication symptoms.  She does have pain in her feet which occurs intermittently.  She does not really describe classic rest pain.  She has no nonhealing wounds.  She occasionally gets swelling in her legs as well.  Her feet have been dusky in appearance for at least a year.  She has had several flareups of gout recently that affected primarily the metatarsal area of her foot.  She describes her foot as becoming warm and very inflamed during these flareups.  She has been followed by rheumatology for this.  Other chronic medical problems include atrial fibrillation for which she is not currently on anticoagulation.  She has been on aspirin in the past.  However she is not on this right now as well.  She did have a recent episode of hemoptysis and is currently undergoing workup for this by the pulmonary service.  She has not had any further hemoptysis episodes in the last few week but has an appointment scheduled with Dr. Elsworth Soho in the near future.  She also has underlying CKD 3.  Baseline serum creatinine is anywhere between 1.4 and 1.6.  Her recent culture from her lungs on bronchoscopy showed 2000 colony-forming units of MRSA.  Past Medical History:  Diagnosis Date  . Anxiety   . Aortic insufficiency    mild due to degenerative changes  . Arthritis   . Basal cell carcinoma 2007   [facility] Left leg  . Carotid artery occlusion   . Chronic kidney disease    CRD Stage 3  . Conjunctivitis due to adenovirus, both eyes   .  Degenerative joint disease   . Diverticulosis of colon   . Dyspnea    with exertion  . Heart murmur   . Hyperlipidemia    NMR 07/2009: LDL 200 (2260/1233)TG 99, HDL 65. LDL goal =<120, ideally <100. father MI @ 59  . Hypertension   . Hypothyroidism    Dr Wilson Singer  . Microscopic hematuria   . Peripheral neuropathy    compressive in UE bilaterally; Dr Daylene Katayama  . Peripheral vascular disease ([place])    ICA bilat, Dr.Charles fields, [facility]  . Pneumonia    2017  . PONV (postoperative nausea and vomiting)    "Inner ear, does  okay with Scopolamine"  . Rectocele   . [place] spotted fever    Past Surgical History:  Procedure Laterality Date  . ABDOMINAL HYSTERECTOMY  1973   age 29 due to dysfuctional menses; HRT x 25-30 years  . APPENDECTOMY  1952  . basal cell cancer  03/2006   leg  . BILATERAL OOPHORECTOMY  1990   prophylactically (sister had ovarian ca)  . CARPAL TUNNEL RELEASE Bilateral 1989   right  . CATARACT EXTRACTION, BILATERAL  2010   Dr Kathrin Penner  . CHOLECYSTECTOMY  2006  . COLONOSCOPY     Dr Ardis Hughs  . EYE SURGERY Left    Bilateral eye (film removed lt eye 01/09/17)  . LUMBAR LAMINECTOMY/DECOMPRESSION MICRODISCECTOMY Left 03/20/2017   Procedure: LEFT LUMBAR FOURLUMBAR FIVE LAMINOTOMY  AND MICROICRODISCECTOMY 1 LEVEL;  Surgeon: Jovita Gamma, MD;  Location: [facility];  Service: Neurosurgery;  Laterality: Left;  LEFT  . NM MYOVIEW LTD  06/13/2008   low risk scan  . ORTHOPEDIC SURGERY  1989/200/2002/2012   elbows,shoulder surgery x 3, right hand  . RECTOCELE REPAIR  2016  . TONSILLECTOMY  1957  . US ECHOCARDIOGRAPHY  05/01/2010   trace MR,AI,TR;EF =>55%  . VIDEO BRONCHOSCOPY Bilateral 07/17/2017   Procedure: VIDEO BRONCHOSCOPY WITHOUT FLUORO;  Surgeon: Rigoberto Noel, MD;  Location: [facility];  Service: Cardiopulmonary;  Laterality: Bilateral;    Family History  Problem Relation Age of Onset  . Heart attack Father 36  . Heart disease Father        Before age 37    . Colon cancer Mother 38  . Stroke Mother 14  . Kidney disease Mother        ? renal calculi; S/P resecton of kidney  . Cancer - Colon Mother 45  . Ovarian cancer Sister   . Other Sister        Valve replacement (aortic & mitral ) in 2 sisters  . Heart disease Sister        Before age 35  . Heart disease Brother        aortic & mitral valve replacement in 2 bro; both had CBAG  . Diabetes Neg Hx   . Breast cancer Neg Hx     SOCIAL HISTORY: Social History   Socioeconomic History  . Marital status: Widowed    Spouse name: Not on file  . Number of children: 1  . Years of education: Not on file  . Highest education level: Not on file  Social Needs  . Financial resource strain: Not on file  . Food insecurity - worry: Not on file  . Food insecurity - inability: Not on file  . Transportation needs - medical: Not on file  . Transportation needs - non-medical: Not on file  Occupational History  . Occupation: retired  Tobacco Use  . Smoking status: Never Smoker  . Smokeless tobacco: Never Used  Substance and Sexual Activity  . Alcohol use: No    Alcohol/week: 0.0 oz  . Drug use: No  . Sexual activity: Not on file  Other Topics Concern  . Not on file  Social History Narrative   Low cholesterol diet   Regular exercise- yes     Allergies  Allergen Reactions  . Aspirin Nausea Only and Other (See Comments)    REACTION: GI upset  ( pt can take 81 mg but NOT   325 mg ASA )  . Lovastatin Nausea Only and Other (See Comments)    REACTION: nausea  . Benazepril Hcl Other (See Comments)    Unknown  . Paroxetine Other (See Comments)    Unknown  . Bupropion Hcl Other (See Comments)    REACTION: tinnitis  . Ezetimibe Nausea And Vomiting and Other (See Comments)    REACTION: GI symptoms  . Fenofibrate Other (See Comments)    Myalgia   . Pravastatin Sodium Other (See Comments)    REACTION: elevated CPK - Muscle's in bilateral Leg    Current Outpatient Medications   Medication Sig Dispense Refill  . b complex vitamins tablet Take 1 tablet by mouth daily after supper.     . bisacodyl (DULCOLAX) 5 MG EC tablet Take 5 mg by mouth daily as needed for moderate constipation.    . cyclobenzaprine (FLEXERIL) 10 MG tablet Take  10 mg by mouth daily as needed for muscle spasms.    . diclofenac sodium (VOLTAREN) 1 % GEL Apply 1 application topically as needed (for pain).    . FIBER PO Take 1 tablet by mouth daily.    Marland Kitchen levothyroxine (SYNTHROID) 88 MCG tablet Take 88 mcg by mouth daily before breakfast. Taking the brand "synthroid"    . LORazepam (ATIVAN) 0.5 MG tablet Take 1 tablet (0.5 mg total) by mouth 3 (three) times daily as needed. (Patient taking differently: Take 0.5 mg by mouth at bedtime. ) 90 tablet 1  . meclizine (ANTIVERT) 25 MG tablet Take 1 tablet (25 mg total) by mouth daily. 1/2 every 8 hrs prn for imbalance (Patient taking differently: Take 12.5 mg by mouth at bedtime. ) 30 tablet 2  . metoprolol succinate (TOPROL-XL) 50 MG 24 hr tablet Take 50 mg by mouth daily.     . Multiple Vitamins-Minerals (CENTRUM SILVER PO) Take 1 tablet by mouth daily after supper.     . pantoprazole (PROTONIX) 40 MG tablet Take 40 mg by mouth daily.     . psyllium (REGULOID) 0.52 g capsule Take 0.52 g by mouth daily. FIBER    . traMADol (ULTRAM) 50 MG tablet 1/2-1 by mouth once daily as needed. (Patient taking differently: Take 25-50 mg by mouth at bedtime as needed for moderate pain or severe pain. ) 90 tablet 1  . aspirin 81 MG tablet Take 81 mg by mouth at bedtime.     Marland Kitchen HYDROcodone-acetaminophen (NORCO/VICODIN) 5-325 MG tablet Take 1 tablet by mouth every 4 (four) hours as needed (pain). (Patient not taking: Reported on 07/15/2017) 35 tablet 0  . predniSONE (DELTASONE) 20 MG tablet Take 1 tablet (20 mg total) by mouth daily with breakfast. (Patient not taking: Reported on 08/25/2017) 2 tablet 0   No current facility-administered medications for this visit.     ROS:    General:  No weight loss, Fever, chills  HEENT: No recent headaches, no nasal bleeding, no visual changes, no sore throat  Neurologic: No dizziness, blackouts, seizures. No recent symptoms of stroke or mini- stroke. No recent episodes of slurred speech, or temporary blindness.  Cardiac: No recent episodes of chest pain/pressure, no shortness of breath at rest.  No shortness of breath with exertion.  Denies history of atrial fibrillation or irregular heartbeat  Vascular: No history of rest pain in feet.  No history of claudication.  No history of non-healing ulcer, No history of DVT   Pulmonary: No home oxygen, no productive cough, + hemoptysis,  No asthma or wheezing  Musculoskeletal:  [X]  Arthritis, [X]  Low back pain,  [X]  Joint pain  Hematologic:No history of hypercoagulable state.  No history of easy bleeding.  No history of anemia  Gastrointestinal: No hematochezia or melena,  No gastroesophageal reflux, no trouble swallowing  Urinary: [X]  chronic Kidney disease, [ ]  on HD - [ ]  MWF or [ ]  TTHS, [ ]  Burning with urination, [ ]  Frequent urination, [ ]  Difficulty urinating;   Skin: No rashes  Psychological: No history of anxiety,  No history of depression   Physical Examination  Vitals:   08/25/17 1149  BP: 125/80  Pulse: 71  Resp: 18  SpO2: (!) 86%  Weight: 110 lb (49.9 kg)  Height: 5\' 5"  (1.651 m)    Body mass index is 18.3 kg/m.  General:  Alert and oriented, no acute distress HEENT: Normal Neck: No bruit or JVD Pulmonary: Clear to auscultation bilaterally Cardiac: Regular Rate  and Rhythm without murmur Abdomen: Soft, non-tender, non-distended, no mass Skin: No rash, feet dusky in appearance bilaterally extending to the ankle, no ulcers Extremity Pulses:  2+ radial, brachial, femoral, absent popliteal dorsalis pedis, posterior tibial pulses bilaterally Musculoskeletal: No deformity trace pedal edema  Neurologic: Upper and lower extremity motor 5/5 and  symmetric  DATA:  Patient had bilateral ABIs performed last week.  This showed calcified vessels and ABIs were unreliable.  Toe pressure was 0 bilaterally.  ASSESSMENT: Patient currently has symptoms of peripheral arterial disease with calcified vessels and toe pressure of 0 bilaterally.  She does not really describe rest pain currently.  She has no nonhealing wounds.  She does not have claudication.  However, I am not sure how much she actually walks.  Due to the fact that she has an ongoing workup in place for hemoptysis and also has slightly abnormal renal function I believe the best option initially would be to get a bilateral lower extremity arterial duplex exam to see if she has any significant flow-limiting stenosis.  We will then make a decision based on her current clinical situation on whether or not an arteriogram would be in her best interest.  I did discuss with her today that I do not believe she is currently at risk of limb loss so we could certainly put this off for a while if we needed to well her lung workup is completed.  She will return to see me in 2 weeks time.   PLAN: See above   Ruta Hinds, MD [facility] Office: [phone] Pager: [phone]

## 2017-08-26 ENCOUNTER — Ambulatory Visit (INDEPENDENT_AMBULATORY_CARE_PROVIDER_SITE_OTHER): Payer: Medicare Other | Admitting: Pulmonary Disease

## 2017-08-26 ENCOUNTER — Encounter: Payer: Self-pay | Admitting: Pulmonary Disease

## 2017-08-26 DIAGNOSIS — R0609 Other forms of dyspnea: Secondary | ICD-10-CM | POA: Diagnosis not present

## 2017-08-26 DIAGNOSIS — R0600 Dyspnea, unspecified: Secondary | ICD-10-CM

## 2017-08-26 DIAGNOSIS — R042 Hemoptysis: Secondary | ICD-10-CM | POA: Diagnosis not present

## 2017-08-26 NOTE — Assessment & Plan Note (Signed)
Since she has not coughed up the blood clot that was noted in her lingular bronchus , we will schedule repeat inspection bronchoscopy  At any rate, I do not think this should interfere with vascular surgery

## 2017-08-26 NOTE — Progress Notes (Signed)
   Subjective:    Patient ID: Deborah Shields, female    DOB: [birth year], 77 y.o.   MRN: [id number]  HPI  77 year old remote smoker adm 06/2017 for hemoptysis She is a remote smoker and smoked about 20 pack years before she quit more than 25 years ago. She has 2 more episodes of blood-tinged sputum since January discharge.  She has fibromuscular dysplasia of bilateral carotid arteries.  She seems to have severe peripheral arterial disease and ongoing evaluation by vascular surgery.  She also has stage III CKD and atrial fibrillation, not on anticoagulation   Significant tests/ events reviewed   06/2017 CT chest - suggestion of endobronchial lesion in the lingular bronchus -Mild bronchiectasis  Bronchoscopy 07/17/17 >> Large blood clot in lingular bronchus which could not be fully aspirated.    Review of Systems neg for any significant sore throat, dysphagia, itching, sneezing, nasal congestion or excess/ purulent secretions, fever, chills, sweats, unintended wt loss, pleuritic or exertional cp, hempoptysis, orthopnea pnd or change in chronic leg swelling. Also denies presyncope, palpitations, heartburn, abdominal pain, nausea, vomiting, diarrhea or change in bowel or urinary habits, dysuria,hematuria, rash, arthralgias, visual complaints, headache, numbness weakness or ataxia.     Objective:   Physical Exam  Gen. Pleasant, well-nourished, in no distress ENT - no thrush, no post nasal drip Neck: No JVD, no thyromegaly, no carotid bruits Lungs: no use of accessory muscles, no dullness to percussion, clear without rales or rhonchi  Cardiovascular: Rhythm regular, heart sounds  normal, no murmurs or gallops, no peripheral edema Musculoskeletal: No deformities, no cyanosis or clubbing         Assessment & Plan:

## 2017-08-26 NOTE — Assessment & Plan Note (Signed)
We will plan for spirometry pre-and post evaluation on next visit

## 2017-08-26 NOTE — Addendum Note (Signed)
Addended by: Lianne Cure A on: 08/26/2017 10:16 AM   Modules accepted: Orders

## 2017-08-26 NOTE — Patient Instructions (Signed)
Bronchoscopy scheduled for Tuesday, 3/12 at [facility] at 8 AM. Nothing to eat after midnight the night before.

## 2017-08-26 NOTE — H&P (View-Only) (Signed)
   Subjective:    Patient ID: Deborah Shields, female    DOB: 09/17/1940, 77 y.o.   MRN: [id number]  HPI  77 year old remote smoker adm 06/2017 for hemoptysis She is a remote smoker and smoked about 20 pack years before she quit more than 25 years ago. She has 2 more episodes of blood-tinged sputum since January discharge.  She has fibromuscular dysplasia of bilateral carotid arteries.  She seems to have severe peripheral arterial disease and ongoing evaluation by vascular surgery.  She also has stage III CKD and atrial fibrillation, not on anticoagulation   Significant tests/ events reviewed   06/2017 CT chest - suggestion of endobronchial lesion in the lingular bronchus -Mild bronchiectasis  Bronchoscopy 07/17/17 >> Large blood clot in lingular bronchus which could not be fully aspirated.    Review of Systems neg for any significant sore throat, dysphagia, itching, sneezing, nasal congestion or excess/ purulent secretions, fever, chills, sweats, unintended wt loss, pleuritic or exertional cp, hempoptysis, orthopnea pnd or change in chronic leg swelling. Also denies presyncope, palpitations, heartburn, abdominal pain, nausea, vomiting, diarrhea or change in bowel or urinary habits, dysuria,hematuria, rash, arthralgias, visual complaints, headache, numbness weakness or ataxia.     Objective:   Physical Exam  Gen. Pleasant, well-nourished, in no distress ENT - no thrush, no post nasal drip Neck: No JVD, no thyromegaly, no carotid bruits Lungs: no use of accessory muscles, no dullness to percussion, clear without rales or rhonchi  Cardiovascular: Rhythm regular, heart sounds  normal, no murmurs or gallops, no peripheral edema Musculoskeletal: No deformities, no cyanosis or clubbing         Assessment & Plan:

## 2017-09-02 ENCOUNTER — Encounter (HOSPITAL_COMMUNITY)
Admission: RE | Disposition: A | Discharge status: Home / Self Care | Payer: Self-pay | Source: Ambulatory Visit | Attending: Pulmonary Disease

## 2017-09-02 ENCOUNTER — Ambulatory Visit (HOSPITAL_COMMUNITY)
Admission: RE | Admit: 2017-09-02 | Discharge: 2017-09-02 | Disposition: A | Discharge status: Home / Self Care | Payer: Medicare Other | Source: Ambulatory Visit | Attending: Pulmonary Disease | Admitting: Pulmonary Disease

## 2017-09-02 ENCOUNTER — Ambulatory Visit (HOSPITAL_COMMUNITY): Admission: RE | Admit: 2017-09-02 | Payer: Medicare Other | Source: Ambulatory Visit | Admitting: Pulmonary Disease

## 2017-09-02 ENCOUNTER — Encounter (HOSPITAL_COMMUNITY): Payer: Self-pay | Admitting: Respiratory Therapy

## 2017-09-02 ENCOUNTER — Encounter (HOSPITAL_COMMUNITY): Admission: RE | Payer: Self-pay | Source: Ambulatory Visit

## 2017-09-02 DIAGNOSIS — R042 Hemoptysis: Secondary | ICD-10-CM | POA: Insufficient documentation

## 2017-09-02 DIAGNOSIS — Z87891 Personal history of nicotine dependence: Secondary | ICD-10-CM | POA: Diagnosis not present

## 2017-09-02 DIAGNOSIS — I739 Peripheral vascular disease, unspecified: Secondary | ICD-10-CM | POA: Insufficient documentation

## 2017-09-02 DIAGNOSIS — N183 Chronic kidney disease, stage 3 (moderate): Secondary | ICD-10-CM | POA: Insufficient documentation

## 2017-09-02 HISTORY — PX: VIDEO BRONCHOSCOPY: SHX5072

## 2017-09-02 LAB — ACID FAST CULTURE WITH REFLEXED SENSITIVITIES (MYCOBACTERIA): Acid Fast Culture: NEGATIVE

## 2017-09-02 SURGERY — VIDEO BRONCHOSCOPY WITHOUT FLUORO
Anesthesia: Moderate Sedation | Laterality: Bilateral

## 2017-09-02 MED ORDER — LIDOCAINE HCL 2 % EX GEL
CUTANEOUS | Status: DC | PRN
  Administered 2017-09-02: 1

## 2017-09-02 MED ORDER — MIDAZOLAM HCL 10 MG/2ML IJ SOLN
INTRAMUSCULAR | Status: DC | PRN
  Administered 2017-09-02 (×2): 1 mg via INTRAVENOUS

## 2017-09-02 MED ORDER — FENTANYL CITRATE (PF) 100 MCG/2ML IJ SOLN
INTRAMUSCULAR | Status: DC | PRN
  Administered 2017-09-02 (×2): 25 ug via INTRAVENOUS

## 2017-09-02 MED ORDER — LIDOCAINE HCL 2 % EX GEL: 1.0000 "application " | Freq: Once | CUTANEOUS | Status: DC

## 2017-09-02 MED ORDER — PHENYLEPHRINE HCL 0.25 % NA SOLN: 1.0000 | Freq: Four times a day (QID) | NASAL | Status: DC | PRN

## 2017-09-02 MED ORDER — SODIUM CHLORIDE 0.9 % IV SOLN
INTRAVENOUS | Status: DC
  Administered 2017-09-02: 08:00:00 via INTRAVENOUS

## 2017-09-02 MED ORDER — LIDOCAINE HCL 1 % IJ SOLN
INTRAMUSCULAR | Status: DC | PRN
  Administered 2017-09-02: 6 mL via RESPIRATORY_TRACT

## 2017-09-02 MED ORDER — FENTANYL CITRATE (PF) 100 MCG/2ML IJ SOLN
INTRAMUSCULAR | Status: AC
  Filled 2017-09-02: qty 4

## 2017-09-02 MED ORDER — PHENYLEPHRINE HCL 0.25 % NA SOLN
NASAL | Status: DC | PRN
  Administered 2017-09-02: 2 via NASAL

## 2017-09-02 MED ORDER — MIDAZOLAM HCL 5 MG/ML IJ SOLN
INTRAMUSCULAR | Status: AC
  Filled 2017-09-02: qty 2

## 2017-09-02 MED ORDER — ONDANSETRON 4 MG PO TBDP
4.0000 mg | ORAL_TABLET | Freq: Once | ORAL | Status: AC
  Administered 2017-09-02: 4 mg via ORAL
  Filled 2017-09-02: qty 1

## 2017-09-02 NOTE — Interval H&P Note (Signed)
History and Physical Interval Note:  09/02/2017 8:36 AM  Deborah Shields  has presented today for surgery, with the diagnosis of Hemopytsis  The various methods of treatment have been discussed with the patient and family. After consideration of risks, benefits and other options for treatment, the patient has consented to  Procedure(s): VIDEO BRONCHOSCOPY WITHOUT FLUORO (Bilateral) as a surgical intervention .  The patient's history has been reviewed, patient examined, no change in status, stable for surgery.  I have reviewed the patient's chart and labs.  Questions were answered to the patient's satisfaction.     Leanna Sato Elsworth Soho

## 2017-09-02 NOTE — Op Note (Signed)
Indication : h/o hemoptysis Bronchoscopy 07/17/17 >> Large blood clot in lingular bronchus which could not be fully aspirated.  Written informed consent was obtained prior to the procedure. The risks of the procedure including coughing, bleeding and the small chance of lung puncture requiring chest tube were discussed in great detail. The benefits & alternatives including serial follow up were also discussed.   2 mg versed & 50  mcg fentnayl used in divided doses during the procedure Bronchoscope entered from the right nare. Upper airway nml Vocal cords showed nml appearance & motion. Trachea & bronchial tree examined to the subsegmental level. Mild amount of white secretions were noted. No endobronchial lesions seen. Mild mucosal bleeding from left main stem bronchusThe clot noted in the lingular bronchus was no longer seen.   Deborah Trent V.  [phone]

## 2017-09-02 NOTE — Progress Notes (Signed)
Video Bronchoscopy done  No samples taken Inspection of airway. First medication given at [facility] last medication given at [facility] in [place] Scope out at [phone]

## 2017-09-02 NOTE — Discharge Instructions (Signed)
Flexible Bronchoscopy, Care After These instructions give you information on caring for yourself after your procedure. Your doctor may also give you more specific instructions. Call your doctor if you have any problems or questions after your procedure. Follow these instructions at home:  Do not eat or drink anything for 2 hours after your procedure. If you try to eat or drink before the medicine wears off, food or drink could go into your lungs. You could also burn yourself.  After 2 hours have passed and when you can cough and gag normally, you may eat soft food and drink liquids slowly.  The day after the test, you may eat your normal diet.  You may do your normal activities.  Keep all doctor visits. Get help right away if:  You get more and more short of breath.  You get light-headed.  You feel like you are going to pass out (faint).  You have chest pain.  You have new problems that worry you.  You cough up more than a little blood.  You cough up more blood than before. This information is not intended to replace advice given to you by your health care provider. Make sure you discuss any questions you have with your health care provider. Document Released: 04/07/2009 Document Revised: 11/16/2015 Document Reviewed: 02/12/2013 Elsevier Interactive Patient Education  2017 [employer].  Nothing to eat or drink until 10:45  am today   09/02/2017 [place] call [phone] with any questions or concerns.

## 2017-09-04 ENCOUNTER — Encounter (HOSPITAL_COMMUNITY): Payer: Self-pay | Admitting: Pulmonary Disease

## 2017-09-04 ENCOUNTER — Telehealth: Payer: Self-pay | Admitting: Pulmonary Disease

## 2017-09-04 NOTE — Telephone Encounter (Signed)
Called and spoke with Marcie Bal from medical records. She stated that she was unable to see the assessment and plan portion on her end to send to the surgical center for patient clearance. Will fax over the office note to surgical center of [facility]. Nothing further needed.    Fax# [phone]

## 2017-09-08 ENCOUNTER — Other Ambulatory Visit: Payer: Self-pay | Admitting: Ophthalmology

## 2017-09-08 DIAGNOSIS — H04561 Stenosis of right lacrimal punctum: Secondary | ICD-10-CM | POA: Diagnosis not present

## 2017-09-08 DIAGNOSIS — H04551 Acquired stenosis of right nasolacrimal duct: Secondary | ICD-10-CM | POA: Diagnosis not present

## 2017-09-08 DIAGNOSIS — H04221 Epiphora due to insufficient drainage, right lacrimal gland: Secondary | ICD-10-CM | POA: Diagnosis not present

## 2017-09-08 DIAGNOSIS — H17811 Minor opacity of cornea, right eye: Secondary | ICD-10-CM | POA: Diagnosis not present

## 2017-09-08 DIAGNOSIS — H04411 Chronic dacryocystitis of right lacrimal passage: Secondary | ICD-10-CM | POA: Diagnosis not present

## 2017-09-08 DIAGNOSIS — H04541 Stenosis of right lacrimal canaliculi: Secondary | ICD-10-CM | POA: Diagnosis not present

## 2017-09-08 DIAGNOSIS — H04123 Dry eye syndrome of bilateral lacrimal glands: Secondary | ICD-10-CM | POA: Diagnosis not present

## 2017-09-10 ENCOUNTER — Ambulatory Visit (HOSPITAL_COMMUNITY)
Admission: RE | Admit: 2017-09-10 | Discharge: 2017-09-10 | Disposition: A | Discharge status: Home / Self Care | Payer: Medicare Other | Source: Ambulatory Visit | Attending: Vascular Surgery | Admitting: Vascular Surgery

## 2017-09-10 DIAGNOSIS — I10 Essential (primary) hypertension: Secondary | ICD-10-CM | POA: Diagnosis not present

## 2017-09-10 DIAGNOSIS — I739 Peripheral vascular disease, unspecified: Secondary | ICD-10-CM

## 2017-09-10 DIAGNOSIS — E785 Hyperlipidemia, unspecified: Secondary | ICD-10-CM | POA: Diagnosis not present

## 2017-09-11 ENCOUNTER — Encounter: Payer: Self-pay | Admitting: Vascular Surgery

## 2017-09-11 ENCOUNTER — Ambulatory Visit (INDEPENDENT_AMBULATORY_CARE_PROVIDER_SITE_OTHER): Payer: Medicare Other | Admitting: Vascular Surgery

## 2017-09-11 VITALS — BP 137/75 | HR 66 | Temp 97.4°F | Resp 18 | Ht 65.0 in | Wt 110.8 lb

## 2017-09-11 DIAGNOSIS — I739 Peripheral vascular disease, unspecified: Secondary | ICD-10-CM | POA: Diagnosis not present

## 2017-09-11 NOTE — Progress Notes (Signed)
Patient is a 77 year old female who returns for follow-up today.  We will follow her for several years for bilateral moderate carotid stenosis secondary to fibromuscular disc laser.  These continue to remain asymptomatic.  However, she has recently developed intermittent pain in her feet.  He does not really describe classic rest pain.  It does not occur at nighttime.  It does not occur daily.  She has no nonhealing wounds.  She does occasionally get some swelling in her legs but this is usually resolved with compression stockings.  She completed her workup for her recent episode of hemoptysis and it was thought to be secondary to a previous pneumonia.  No further workup is indicated.  She does have a history of CKD 3.  Baseline creatinine somewhere between 1.4 and 1.6.  She returns today after an arterial duplex exam and ABIs of her lower extremities.  Review of systems: She denies claudication.  She denies rest pain.  She denies nonhealing wounds.  She denies episodes of TIA amaurosis or stroke.  Past Medical History:  Diagnosis Date  . Anxiety   . Aortic insufficiency    mild due to degenerative changes  . Arthritis   . Basal cell carcinoma 2007   [facility] Left leg  . Carotid artery occlusion   . Chronic kidney disease    CRD Stage 3  . Conjunctivitis due to adenovirus, both eyes   . Degenerative joint disease   . Diverticulosis of colon   . Dyspnea    with exertion  . Heart murmur   . Hyperlipidemia    NMR 07/2009: LDL 200 (2260/1233)TG 99, HDL 65. LDL goal =<120, ideally <100. father MI @ 50  . Hypertension   . Hypothyroidism    Dr Wilson Singer  . Microscopic hematuria   . Peripheral neuropathy    compressive in UE bilaterally; Dr Daylene Katayama  . Peripheral vascular disease ([place])    ICA bilat, Dr.Paris Hohn, [facility]  . Pneumonia    2017  . PONV (postoperative nausea and vomiting)    "Inner ear, does  okay with Scopolamine"  . Rectocele   . [facility] spotted fever      Current  Outpatient Medications on File Prior to Visit  Medication Sig Dispense Refill  . b complex vitamins tablet Take 1 tablet by mouth daily after supper.     . cyclobenzaprine (FLEXERIL) 10 MG tablet Take 10 mg by mouth 2 (two) times daily as needed for muscle spasms (scheduled every every).     Marland Kitchen diclofenac sodium (VOLTAREN) 1 % GEL Apply 2-4 g topically 4 (four) times daily as needed (for pain).     . FIBER PO Take 1 tablet by mouth daily.    Marland Kitchen levothyroxine (SYNTHROID) 88 MCG tablet Take 88 mcg by mouth daily before breakfast. Taking the brand "synthroid"    . LORazepam (ATIVAN) 0.5 MG tablet Take 1 tablet (0.5 mg total) by mouth 3 (three) times daily as needed. (Patient taking differently: Take 0.5 mg by mouth at bedtime. ) 90 tablet 1  . meclizine (ANTIVERT) 25 MG tablet Take 1 tablet (25 mg total) by mouth daily. 1/2 every 8 hrs prn for imbalance (Patient taking differently: Take 12.5-25 mg by mouth at bedtime. ) 30 tablet 2  . metoprolol succinate (TOPROL-XL) 50 MG 24 hr tablet Take 25 mg by mouth 2 (two) times daily after a meal. After breakfast & after supper    . Multiple Vitamins-Minerals (CENTRUM SILVER PO) Take 1 tablet by  mouth daily after supper.     . Probiotic Product (PROBIOTIC ADVANCED PO) Take 1 capsule by mouth daily after breakfast. VSL #3    . psyllium (REGULOID) 0.52 g capsule Take 0.52 g by mouth daily. FIBER    . traMADol (ULTRAM) 50 MG tablet 1/2-1 by mouth once daily as needed. (Patient taking differently: Take 50 mg by mouth 2 (two) times daily as needed for moderate pain or severe pain (1 tablet by mouth scheduled each bedtime.). ) 90 tablet 1   No current facility-administered medications on file prior to visit.      Physical exam:  Vitals:   09/11/17 1539  BP: 137/75  Pulse: 66  Resp: 18  Temp: (!) 97.4 F (36.3 C)  TempSrc: Oral  SpO2: 100%  Weight: 110 lb 12.8 oz (50.3 kg)  Height: 5\' 5"  (1.651 m)    Neuro: Alert oriented x3 symmetric upper extremity  lower extremity motor strength 5/5  Data: Patient had a lower extremity arterial duplex which showed monophasic waveforms in the right superficial femoral artery which may represent a short segment occlusion.  Left lower extremity had triphasic waveforms all the way down with the exception of her anterior and posterior tibial arteries which were patent but had biphasic waveform suggesting some mild occlusive disease.  Assessment: Bilateral foot pain most likely not secondary to arterial occlusive disease.  She certainly does not have rest pain.  She does have findings of arterial occlusive disease in both lower extremities right worse than left.  However, I do not believe that she is at risk of limb loss with her current state of perfusion.  If she developed rest pain or nonhealing wounds we would consider pursuing this further with an arteriogram.  However with her history of renal dysfunction I would not consider an arteriogram for her with the possibility of worsening renal function for lifestyle symptoms alone unless these were very debilitating.  She does not feel that disabled by her current symptoms.  Plan: Patient has follow-up with our nurse practitioner in May for her carotid surveillance exam.  We will add on bilateral lower extremity duplex exam as well to see if there is any difference.  If it is stable the most likely no further workup indicated unless she develops rest pain or nonhealing wounds.  This was discussed with the patient and her daughter today.  They were reassured that lifetime risk of limb loss even with perfusion down as low as 50% is less than 5% lifetime since she really has no significant risk factors.  Ruta Hinds, MD [facility] [place] Office: [phone] Pager: [phone]

## 2017-09-12 ENCOUNTER — Other Ambulatory Visit: Payer: Self-pay

## 2017-09-12 DIAGNOSIS — I739 Peripheral vascular disease, unspecified: Secondary | ICD-10-CM

## 2017-09-18 DIAGNOSIS — M8589 Other specified disorders of bone density and structure, multiple sites: Secondary | ICD-10-CM | POA: Diagnosis not present

## 2017-09-29 DIAGNOSIS — H04123 Dry eye syndrome of bilateral lacrimal glands: Secondary | ICD-10-CM | POA: Diagnosis not present

## 2017-09-29 DIAGNOSIS — H04551 Acquired stenosis of right nasolacrimal duct: Secondary | ICD-10-CM | POA: Diagnosis not present

## 2017-09-29 DIAGNOSIS — H04541 Stenosis of right lacrimal canaliculi: Secondary | ICD-10-CM | POA: Diagnosis not present

## 2017-09-29 DIAGNOSIS — Z09 Encounter for follow-up examination after completed treatment for conditions other than malignant neoplasm: Secondary | ICD-10-CM | POA: Diagnosis not present

## 2017-09-29 DIAGNOSIS — H04221 Epiphora due to insufficient drainage, right lacrimal gland: Secondary | ICD-10-CM | POA: Diagnosis not present

## 2017-09-29 DIAGNOSIS — H17811 Minor opacity of cornea, right eye: Secondary | ICD-10-CM | POA: Diagnosis not present

## 2017-11-20 ENCOUNTER — Ambulatory Visit (INDEPENDENT_AMBULATORY_CARE_PROVIDER_SITE_OTHER): Payer: Medicare Other | Admitting: Family

## 2017-11-20 ENCOUNTER — Encounter: Payer: Self-pay | Admitting: Family

## 2017-11-20 ENCOUNTER — Other Ambulatory Visit: Payer: Self-pay

## 2017-11-20 ENCOUNTER — Ambulatory Visit (HOSPITAL_COMMUNITY)
Admission: RE | Admit: 2017-11-20 | Discharge: 2017-11-20 | Disposition: A | Discharge status: Home / Self Care | Payer: Medicare Other | Source: Ambulatory Visit | Attending: Family | Admitting: Family

## 2017-11-20 ENCOUNTER — Encounter (HOSPITAL_COMMUNITY): Payer: Medicare Other

## 2017-11-20 ENCOUNTER — Ambulatory Visit: Payer: Medicare Other | Admitting: Family

## 2017-11-20 VITALS — BP 149/85 | HR 63 | Temp 97.7°F | Resp 14 | Ht 65.0 in | Wt 104.0 lb

## 2017-11-20 DIAGNOSIS — R200 Anesthesia of skin: Secondary | ICD-10-CM | POA: Diagnosis not present

## 2017-11-20 DIAGNOSIS — I773 Arterial fibromuscular dysplasia: Secondary | ICD-10-CM

## 2017-11-20 DIAGNOSIS — I739 Peripheral vascular disease, unspecified: Secondary | ICD-10-CM | POA: Diagnosis not present

## 2017-11-20 DIAGNOSIS — E785 Hyperlipidemia, unspecified: Secondary | ICD-10-CM | POA: Insufficient documentation

## 2017-11-20 DIAGNOSIS — I10 Essential (primary) hypertension: Secondary | ICD-10-CM | POA: Diagnosis not present

## 2017-11-20 DIAGNOSIS — I779 Disorder of arteries and arterioles, unspecified: Secondary | ICD-10-CM

## 2017-11-20 DIAGNOSIS — I70201 Unspecified atherosclerosis of native arteries of extremities, right leg: Secondary | ICD-10-CM | POA: Insufficient documentation

## 2017-11-20 NOTE — Patient Instructions (Signed)

## 2017-11-20 NOTE — Progress Notes (Signed)
[facility] [place] HISTORY AND PHYSICAL   CC: Follow up extracranial carotid artery fibromuscular dysplasia and PAD    History of Present Illness:   Deborah Shields is a 77 y.o. female whom Dr. [name] has been monitoring for fibromuscular dysplasia or carotid arteries and peripheral artery occlusive disease.  She returns today for scheduled surveillance.  She denies ever having a stroke or TIA symptoms. Specifically she denies a history of: amaurosis fugax or monocular blindness, unilateral facial drooping, hemiplegia, or receptive or expressive aphasia.  She denies claudication sx's with walking, denies non-healing wounds.   Sees a cardiologist, Dr. Orene Desanctis, due to family hx of CAD and personal hx of murmur.    She had corticosteroid injections in her T-spine, Dr. Maryjean Ka, for pain in the left shoulder, tingling and numbness in left arm/hand which helped. She had [facility] Spotted Fever in the Summer 2017.   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   Dr. [name] last evaluated pt on 09-11-17. At that time patient had a lower extremity arterial duplex which showed monophasic waveforms in the right superficial femoral artery which may represent a short segment occlusion.  Left lower extremity had triphasic waveforms all the way down with the exception of her anterior and posterior tibial arteries which were patent but had biphasic waveform suggesting some mild occlusive disease. Bilateral foot pain most likely not secondary to arterial occlusive disease.  She certainly does not have rest pain.  She does have findings of arterial occlusive disease in both lower extremities right worse than left.  However, Dr. [name] did not believe that she was at risk of limb loss with her state of perfusion at that time.  If she developed rest pain or nonhealing wounds we would consider pursuing this further with an arteriogram.  However with her history of  renal dysfunction Dr. [name] would not consider an arteriogram for her with the possibility of worsening renal function for lifestyle symptoms alone unless these were very debilitating.  She did not feel disabled by her symptoms at that time. Patient has follow-up with our nurse practitioner in May for her carotid surveillance exam.  We will add on bilateral lower extremity duplex exam as well to see if there is any difference.  If it is stable the most likely no further workup indicated unless she develops rest pain or nonhealing wounds.  This was discussed with the patient and her daughter that day.  They were reassured that lifetime risk of limb loss even with perfusion down as low as 50% is less than 5% lifetime since she really has no significant risk factors.  Her last serum creatinine result on file was 1.36 with GFR of 36 on 07-17-17.   She states her left foot and lower left leg feel numb, her right foot does not bother her.  Pt was taken off the gout medication due to her rheumatologist surmising that she does not have gout, that she has lack of arterial perfusion in her feet.   She walks at least 30 minutes daily in the course of her day.   She was with her sister this morning when she passed away, her sister had been chronically ill.   Pt states her back problem was "toe knee short leg", addressed by Dr. Rita Ohara with surgery.    Diabetic: No Tobacco use: non-smoker; however, her husband smoked 3 ppd in their home, he died in 09-21-1994; and she was exposed to secondhand smoke  at her workplace for 40 years; all men smoked.   Pt meds include:  Statin : No: all statins tried caused very elevated liver and muscle enzymes and muscle weakness/severe cramping  Betablocker: Yes  ASA: Yes  Other anticoagulants/antiplatelets: no     Current Outpatient Medications  Medication Sig Dispense Refill  . b complex vitamins tablet Take 1 tablet by mouth daily after supper.     .  cyclobenzaprine (FLEXERIL) 10 MG tablet Take 10 mg by mouth 2 (two) times daily as needed for muscle spasms (scheduled every every).     Marland Kitchen diclofenac sodium (VOLTAREN) 1 % GEL Apply 2-4 g topically 4 (four) times daily as needed (for pain).     . FIBER PO Take 1 tablet by mouth daily.    Marland Kitchen levothyroxine (SYNTHROID) 88 MCG tablet Take 88 mcg by mouth daily before breakfast. Taking the brand "synthroid"    . LORazepam (ATIVAN) 0.5 MG tablet Take 1 tablet (0.5 mg total) by mouth 3 (three) times daily as needed. (Patient taking differently: Take 0.5 mg by mouth at bedtime. ) 90 tablet 1  . meclizine (ANTIVERT) 25 MG tablet Take 1 tablet (25 mg total) by mouth daily. 1/2 every 8 hrs prn for imbalance (Patient taking differently: Take 12.5-25 mg by mouth at bedtime. ) 30 tablet 2  . metoprolol succinate (TOPROL-XL) 50 MG 24 hr tablet Take 25 mg by mouth 2 (two) times daily after a meal. After breakfast & after supper    . Multiple Vitamins-Minerals (CENTRUM SILVER PO) Take 1 tablet by mouth daily after supper.     . Probiotic Product (PROBIOTIC ADVANCED PO) Take 1 capsule by mouth daily after breakfast. VSL #3    . psyllium (REGULOID) 0.52 g capsule Take 0.52 g by mouth daily. FIBER    . traMADol (ULTRAM) 50 MG tablet 1/2-1 by mouth once daily as needed. (Patient taking differently: Take 50 mg by mouth 2 (two) times daily as needed for moderate pain or severe pain (1 tablet by mouth scheduled each bedtime.). ) 90 tablet 1   No current facility-administered medications for this visit.     Past Medical History:  Diagnosis Date  . Anxiety   . Aortic insufficiency    mild due to degenerative changes  . Arthritis   . Basal cell carcinoma 2007   [facility] Left leg  . Carotid artery occlusion   . Chronic kidney disease    CRD Stage 3  . Conjunctivitis due to adenovirus, both eyes   . Degenerative joint disease   . Diverticulosis of colon   . Dyspnea    with exertion  . Heart murmur   .  Hyperlipidemia    NMR 07/2009: LDL 200 (2260/1233)TG 99, HDL 65. LDL goal =<120, ideally <100. father MI @ 30  . Hypertension   . Hypothyroidism    Dr Wilson Singer  . Microscopic hematuria   . Peripheral neuropathy    compressive in UE bilaterally; Dr Daylene Katayama  . Peripheral vascular disease ([place])    ICA bilat, Dr.Charles fields, [facility]  . Pneumonia    2017  . PONV (postoperative nausea and vomiting)    "Inner ear, does  okay with Scopolamine"  . Rectocele   . [place] spotted fever     Social History Social History   Tobacco Use  . Smoking status: Never Smoker  . Smokeless tobacco: Never Used  Substance Use Topics  . Alcohol use: No    Alcohol/week: 0.0 oz  .  Drug use: No    Family History Family History  Problem Relation Age of Onset  . Heart attack Father 18  . Heart disease Father        Before age 71  . Colon cancer Mother 50  . Stroke Mother 65  . Kidney disease Mother        ? renal calculi; S/P resecton of kidney  . Cancer - Colon Mother 52  . Ovarian cancer Sister   . Other Sister        Valve replacement (aortic & mitral ) in 2 sisters  . Heart disease Sister        Before age 41  . Heart disease Brother        aortic & mitral valve replacement in 2 bro; both had CBAG  . Diabetes Neg Hx   . Breast cancer Neg Hx     Surgical History Past Surgical History:  Procedure Laterality Date  . ABDOMINAL HYSTERECTOMY  1973   age 42 due to dysfuctional menses; HRT x 25-30 years  . APPENDECTOMY  1952  . basal cell cancer  03/2006   leg  . BILATERAL OOPHORECTOMY  1990   prophylactically (sister had ovarian ca)  . CARPAL TUNNEL RELEASE Bilateral 1989   right  . CATARACT EXTRACTION, BILATERAL  2010   Dr Kathrin Penner  . CHOLECYSTECTOMY  2006  . COLONOSCOPY     Dr Ardis Hughs  . EYE SURGERY Left    Bilateral eye (film removed lt eye 01/09/17)  . LUMBAR LAMINECTOMY/DECOMPRESSION MICRODISCECTOMY Left 03/20/2017   Procedure: LEFT LUMBAR FOURLUMBAR FIVE LAMINOTOMY AND  MICROICRODISCECTOMY 1 LEVEL;  Surgeon: Jovita Gamma, MD;  Location: [place];  Service: Neurosurgery;  Laterality: Left;  LEFT  . NM MYOVIEW LTD  06/13/2008   low risk scan  . ORTHOPEDIC SURGERY  1989/200/2002/2012   elbows,shoulder surgery x 3, right hand  . RECTOCELE REPAIR  2016  . TONSILLECTOMY  1957  . US ECHOCARDIOGRAPHY  05/01/2010   trace MR,AI,TR;EF =>55%  . VIDEO BRONCHOSCOPY Bilateral 07/17/2017   Procedure: VIDEO BRONCHOSCOPY WITHOUT FLUORO;  Surgeon: Rigoberto Noel, MD;  Location: [facility];  Service: Cardiopulmonary;  Laterality: Bilateral;  . VIDEO BRONCHOSCOPY Bilateral 09/02/2017   Procedure: VIDEO BRONCHOSCOPY WITHOUT FLUORO;  Surgeon: Rigoberto Noel, MD;  Location: [facility];  Service: Cardiopulmonary;  Laterality: Bilateral;    Allergies  Allergen Reactions  . Aspirin Nausea Only and Other (See Comments)    REACTION: GI upset  ( pt can take 81 mg but NOT   325 mg ASA )  . Lovastatin Nausea Only and Other (See Comments)    REACTION: nausea  . Benazepril Hcl Other (See Comments)    Unknown  . Paroxetine Other (See Comments)    Unknown  . Bupropion Hcl Other (See Comments)    REACTION: tinnitis  . Ezetimibe Nausea And Vomiting and Other (See Comments)    REACTION: GI symptoms  . Fenofibrate Other (See Comments)    Myalgia   . Pravastatin Sodium Other (See Comments)    REACTION: elevated CPK - Muscle's in bilateral Leg    Current Outpatient Medications  Medication Sig Dispense Refill  . b complex vitamins tablet Take 1 tablet by mouth daily after supper.     . cyclobenzaprine (FLEXERIL) 10 MG tablet Take 10 mg by mouth 2 (two) times daily as needed for muscle spasms (scheduled every every).     Marland Kitchen diclofenac sodium (VOLTAREN) 1 % GEL Apply 2-4 g topically 4 (four)  times daily as needed (for pain).     . FIBER PO Take 1 tablet by mouth daily.    Marland Kitchen levothyroxine (SYNTHROID) 88 MCG tablet Take 88 mcg by mouth daily before breakfast. Taking the brand  "synthroid"    . LORazepam (ATIVAN) 0.5 MG tablet Take 1 tablet (0.5 mg total) by mouth 3 (three) times daily as needed. (Patient taking differently: Take 0.5 mg by mouth at bedtime. ) 90 tablet 1  . meclizine (ANTIVERT) 25 MG tablet Take 1 tablet (25 mg total) by mouth daily. 1/2 every 8 hrs prn for imbalance (Patient taking differently: Take 12.5-25 mg by mouth at bedtime. ) 30 tablet 2  . metoprolol succinate (TOPROL-XL) 50 MG 24 hr tablet Take 25 mg by mouth 2 (two) times daily after a meal. After breakfast & after supper    . Multiple Vitamins-Minerals (CENTRUM SILVER PO) Take 1 tablet by mouth daily after supper.     . Probiotic Product (PROBIOTIC ADVANCED PO) Take 1 capsule by mouth daily after breakfast. VSL #3    . psyllium (REGULOID) 0.52 g capsule Take 0.52 g by mouth daily. FIBER    . traMADol (ULTRAM) 50 MG tablet 1/2-1 by mouth once daily as needed. (Patient taking differently: Take 50 mg by mouth 2 (two) times daily as needed for moderate pain or severe pain (1 tablet by mouth scheduled each bedtime.). ) 90 tablet 1   No current facility-administered medications for this visit.      REVIEW OF SYSTEMS: See HPI for pertinent positives and negatives.  Physical Examination Vitals:   11/20/17 1540 11/20/17 1542 11/20/17 1543  BP: (!) 146/85 (!) 147/85 (!) 149/85  Pulse: 62 63 63  Resp: 14    Temp: 97.7 F (36.5 C)    TempSrc: Oral    SpO2: 100%    Weight: 104 lb (47.2 kg)    Height: 5\' 5"  (1.651 m)     Body mass index is 17.31 kg/m.  General:  WDWN in NAD Gait: Normal HENT: WNL Eyes: PERRLA Pulmonary: normal non-labored breathing, good air movement, CTAB, no rales, rhonchi, or wheezing Cardiac: RRR, no murmur detected Abdomen: soft, NT, no masses palpated Skin: no rashes, no ulcers, no cellulitis.   VASCULAR EXAM Carotid Bruits Left Right   Negative  Negative    Abdominal aortic pulse is not palpable.  Radial pulses are 2+ palpable and equal.    LE Pulses  LEFT  RIGHT   FEMORAL 2+ palpable 2+ palpable  POPLITEAL  1+ palpable  1+ palpable  POSTERIOR TIBIAL  not palpable   1+palpable   DORSALIS PEDIS ANTERIOR TIBIAL  Not palpable  not palpable    Musculoskeletal: No muscle atrophy/wasting. M/S 5/5 throughout, Extremities without ischemic changes. Forefeet at dorsal aspects and all toes with dependent rubor. Capillary refill in right toes is <3 seconds, in left toes is 5 seconds. No ulcers, no open wounds, no gangrene.  Neurologic:  A&O X 3; appropriate affect, sensation is normal; speech is normal, CN 2-12 intact, pain and light touch intact in extremities, motor exam as listed above. Psychiatric: Normal thought content, mood appropriate to clinical situation.     ASSESSMENT:  TANYA CROTHERS is a 77 y.o. female who presents with asymptomatic bilateral ICA stenosis c/w fibromuscular dysplasia.  She has peripheral artery occlusive disease with no claudication sx's, no signs of ischemia in her feet or legs other than dependent rubor in both feet.   Her atherosclerotic risk factors include secondhand smoke exposure  for 40+ years at works and at home, although she never used tobacco. She also has a statin intolerance. Fortunately she does not have DM.   Review of records: in April 2014 carotid Duplex suggested 60-79% bilateral ICA stenoses.  Her last serum creatinine result on file was 1.36 with GFR of 36 on 07-17-17.    Dr. [name] reviewed pt bilateral LE arterial duplex results, we doscussed her HPI and physical exam results. Dr. [name] spoke with pt. There is a 347 cm/s velocity in her right EIA, her right leg is asymptomatic. No significant stenosis in the arteries of her left leg.  Dr. [name] offered pt an arteriogram to try to determine where stenoses might be, that could possibly be amenable to revascularization. Pt declined at this time, states she will let us know if she changes her mind.      DATA  Carotid Duplex (11/20/17); 40-59% bilateral ICA stenosis at the distal segments, tortuosity c/w fibromuscular dysplasia.  Bilateral vertebral artery flow is antegrade.  Bilateral subclavian artery waveforms are normal.  No significant change compared to the exam on 11-14-16.    Bilateral LE Arterial duplex (11/20/17): Right: Focal velocity of 347 cm/s in the mid EIA with post stenotic turbulence, 50-75% stenosis.All monophasic waveforms except at [facility] which is triphasic.  Left: No significant stenosis, bi and triphasic waveforms.    ABI (Date: 08-20-17):  R:   ABI: [employer] (was 0.89 with mono and triphasic waveforms on 04-04-11),   PT: mono  DP: mono  TBI:  0  L:   ABI: [place] (was 0.99 with triphasic waveforms),   PT: mono  DP: mono  TBI: 0  Bilateral ABI indicate non compressible vessels with monophasic waveforms, significant decline compared to the last ABI in 2012.  TBI are 0.    PLAN:   Based on today's exam and non-invasive vascular lab results, the patient will follow up in 6 months with the following test: ABI.  Carotid duplex in a year.  I advised pt to notify us if she develops concerns re the circulation in her feet or legs.   I discussed in depth with the patient the nature of atherosclerosis, and emphasized the importance of maximal medical management including strict control of blood pressure, blood glucose, and lipid levels, obtaining regular exercise, and cessation of smoking.  The patient is aware that without maximal medical management the underlying atherosclerotic disease process will progress, limiting the benefit of any interventions.  The patient was given information about stroke prevention and what symptoms should prompt the patient to seek immediate medical care.  The patient was given information about PAD including signs, symptoms, treatment, what symptoms should prompt the patient to seek immediate medical care, and risk reduction measures  to take.  Thank you for allowing [place] to participate in this patient's care.  Clemon Chambers, RN, MSN, FNP-C [facility] Office: [phone]  Clinic MD: [name] 11/20/2017 4:00 PM

## 2017-11-26 ENCOUNTER — Encounter: Payer: Self-pay | Admitting: Pulmonary Disease

## 2017-11-26 ENCOUNTER — Ambulatory Visit (INDEPENDENT_AMBULATORY_CARE_PROVIDER_SITE_OTHER): Payer: Medicare Other | Admitting: Pulmonary Disease

## 2017-11-26 VITALS — BP 114/72 | HR 58 | Ht 65.0 in | Wt 108.0 lb

## 2017-11-26 DIAGNOSIS — R0609 Other forms of dyspnea: Secondary | ICD-10-CM

## 2017-11-26 DIAGNOSIS — I779 Disorder of arteries and arterioles, unspecified: Secondary | ICD-10-CM | POA: Diagnosis not present

## 2017-11-26 DIAGNOSIS — R042 Hemoptysis: Secondary | ICD-10-CM | POA: Diagnosis not present

## 2017-11-26 DIAGNOSIS — J449 Chronic obstructive pulmonary disease, unspecified: Secondary | ICD-10-CM

## 2017-11-26 DIAGNOSIS — R0600 Dyspnea, unspecified: Secondary | ICD-10-CM

## 2017-11-26 NOTE — Progress Notes (Signed)
   Subjective:    Patient ID: Deborah Shields, female    DOB: 1941/05/09, 77 y.o.   MRN: [id number]  HPI 77 year old remote smoker adm 06/2017 for hemoptysis Bronchoscopy only showed clot in the lingula bronchus without any clear etiology which cleared on follow-up evaluation She is a remote smoker and smoked about 20 pack years before she quit more than 25 years ago. She has fibromuscular dysplasia of bilateral carotid arteries.  She has severe peripheral arterial disease and sees vascular surgery.  She also has stage III CKD and atrial fibrillation, not on anticoagulation  Hemoptysis has not recurred since her last visit, her breathing is stable. She last had an older sister to heart disease at age 27 and another sister has severe emphysema   Spirometry showed moderate airway obstruction with ratio 61, FEV1 of 56% and FVC of 68% Significant tests/ events reviewed   06/2017 CT chest - suggestion of endobronchial lesion in the lingular bronchus -Mild bronchiectasis  Bronchoscopy 07/17/17 >> Large blood clot in lingular bronchus which could not be fully aspirated. Rpt bscopy 08/2017 >> clot noted in the lingular bronchus was no longer seen     Review of Systems Patient denies significant dyspnea,cough, hemoptysis,  chest pain, palpitations, pedal edema, orthopnea, paroxysmal nocturnal dyspnea, lightheadedness, nausea, vomiting, abdominal or  leg pains      Objective:   Physical Exam  Gen. Pleasant, thin, elderly,, in no distress ENT - no thrush, no post nasal drip Neck: No JVD, no thyromegaly, no carotid bruits Lungs: no use of accessory muscles, no dullness to percussion, clear without rales or rhonchi  Cardiovascular: Rhythm regular, heart sounds  normal, no murmurs or gallops, no peripheral edema Musculoskeletal: No deformities, no cyanosis or clubbing , ischemic changes both feet       Assessment & Plan:

## 2017-11-26 NOTE — Patient Instructions (Signed)
Lung function is 56%. You do have moderate COPD  Call us if breathing gets worse or if you catch a bad chest cold

## 2017-11-26 NOTE — Assessment & Plan Note (Signed)
Appears to have resolved. Cleared from our standpoint for peripheral arterial disease intervention if required

## 2017-11-26 NOTE — Assessment & Plan Note (Signed)
She is relatively asymptomatic for this degree of airway obstruction hence we will hold off on bronchodilators. We discussed signs and symptoms of a chest cold and she will contact us should this happen or should her dyspnea get worse in the future

## 2017-12-01 DIAGNOSIS — E039 Hypothyroidism, unspecified: Secondary | ICD-10-CM | POA: Diagnosis not present

## 2017-12-01 DIAGNOSIS — H04541 Stenosis of right lacrimal canaliculi: Secondary | ICD-10-CM | POA: Diagnosis not present

## 2017-12-01 DIAGNOSIS — Z09 Encounter for follow-up examination after completed treatment for conditions other than malignant neoplasm: Secondary | ICD-10-CM | POA: Diagnosis not present

## 2017-12-01 DIAGNOSIS — I10 Essential (primary) hypertension: Secondary | ICD-10-CM | POA: Diagnosis not present

## 2017-12-01 DIAGNOSIS — H04551 Acquired stenosis of right nasolacrimal duct: Secondary | ICD-10-CM | POA: Diagnosis not present

## 2017-12-01 DIAGNOSIS — H04221 Epiphora due to insufficient drainage, right lacrimal gland: Secondary | ICD-10-CM | POA: Diagnosis not present

## 2017-12-01 DIAGNOSIS — M109 Gout, unspecified: Secondary | ICD-10-CM | POA: Diagnosis not present

## 2017-12-01 DIAGNOSIS — H04123 Dry eye syndrome of bilateral lacrimal glands: Secondary | ICD-10-CM | POA: Diagnosis not present

## 2017-12-01 DIAGNOSIS — H0012 Chalazion right lower eyelid: Secondary | ICD-10-CM | POA: Diagnosis not present

## 2017-12-01 DIAGNOSIS — H17811 Minor opacity of cornea, right eye: Secondary | ICD-10-CM | POA: Diagnosis not present

## 2017-12-05 ENCOUNTER — Other Ambulatory Visit: Payer: Self-pay

## 2017-12-05 DIAGNOSIS — I779 Disorder of arteries and arterioles, unspecified: Secondary | ICD-10-CM

## 2017-12-09 DIAGNOSIS — E039 Hypothyroidism, unspecified: Secondary | ICD-10-CM | POA: Diagnosis not present

## 2017-12-09 DIAGNOSIS — M549 Dorsalgia, unspecified: Secondary | ICD-10-CM | POA: Diagnosis not present

## 2017-12-09 DIAGNOSIS — I10 Essential (primary) hypertension: Secondary | ICD-10-CM | POA: Diagnosis not present

## 2017-12-09 DIAGNOSIS — Z0000 Encounter for general adult medical examination without abnormal findings: Secondary | ICD-10-CM | POA: Diagnosis not present

## 2017-12-09 DIAGNOSIS — E785 Hyperlipidemia, unspecified: Secondary | ICD-10-CM | POA: Diagnosis not present

## 2017-12-09 DIAGNOSIS — Z79899 Other long term (current) drug therapy: Secondary | ICD-10-CM | POA: Diagnosis not present

## 2017-12-10 DIAGNOSIS — L821 Other seborrheic keratosis: Secondary | ICD-10-CM | POA: Diagnosis not present

## 2017-12-10 DIAGNOSIS — Z85828 Personal history of other malignant neoplasm of skin: Secondary | ICD-10-CM | POA: Diagnosis not present

## 2017-12-10 DIAGNOSIS — L570 Actinic keratosis: Secondary | ICD-10-CM | POA: Diagnosis not present

## 2017-12-10 DIAGNOSIS — L720 Epidermal cyst: Secondary | ICD-10-CM | POA: Diagnosis not present

## 2017-12-10 DIAGNOSIS — D485 Neoplasm of uncertain behavior of skin: Secondary | ICD-10-CM | POA: Diagnosis not present

## 2017-12-10 DIAGNOSIS — D1801 Hemangioma of skin and subcutaneous tissue: Secondary | ICD-10-CM | POA: Diagnosis not present

## 2017-12-16 DIAGNOSIS — L720 Epidermal cyst: Secondary | ICD-10-CM | POA: Diagnosis not present

## 2017-12-16 DIAGNOSIS — L570 Actinic keratosis: Secondary | ICD-10-CM | POA: Diagnosis not present

## 2017-12-16 DIAGNOSIS — Z85828 Personal history of other malignant neoplasm of skin: Secondary | ICD-10-CM | POA: Diagnosis not present

## 2017-12-26 ENCOUNTER — Telehealth: Payer: Self-pay | Admitting: Pulmonary Disease

## 2017-12-26 NOTE — Telephone Encounter (Signed)
Called and spoke to [place] with [facility].  Lelan Pons stated Glydo gel 2% needs a PA.  Per pt's chart, Rx was last prescribed on 09/02/17 by Dr. Elsworth Soho. Per Lelan Pons pt requested that PA be started.  I have contacted pt regarding this matter.  Pt stated she did not request this medication.  I have ATC [facility] to make them aware that Rx is not needed, was placed on hold for greater then 6 minutes.  Will call back.

## 2017-12-29 ENCOUNTER — Telehealth: Payer: Self-pay | Admitting: Pulmonary Disease

## 2017-12-29 NOTE — Telephone Encounter (Signed)
Spoke with Deborah Shields at [employer] to make aware of below documentation.  Nothing further needed.

## 2017-12-29 NOTE — Telephone Encounter (Signed)
Looked through pt's current meds and also recent meds and there is no history of pt being on ondasteron.  Called [facility] and spoke with Asencion Partridge in regards to [place] stating to her that I was not seeing any information stated where we had prescribed ondasteron for pt.  Per Asencion Partridge, it looks like pt called herself in regards to the med and when someone called to give information over the phone for PA, the information was denied.  Nothing further needed.

## 2018-01-22 DIAGNOSIS — N289 Disorder of kidney and ureter, unspecified: Secondary | ICD-10-CM | POA: Diagnosis not present

## 2018-01-22 DIAGNOSIS — E039 Hypothyroidism, unspecified: Secondary | ICD-10-CM | POA: Diagnosis not present

## 2018-01-22 DIAGNOSIS — I129 Hypertensive chronic kidney disease with stage 1 through stage 4 chronic kidney disease, or unspecified chronic kidney disease: Secondary | ICD-10-CM | POA: Diagnosis not present

## 2018-01-22 DIAGNOSIS — N390 Urinary tract infection, site not specified: Secondary | ICD-10-CM | POA: Diagnosis not present

## 2018-01-22 DIAGNOSIS — N183 Chronic kidney disease, stage 3 (moderate): Secondary | ICD-10-CM | POA: Diagnosis not present

## 2018-03-09 DIAGNOSIS — H0012 Chalazion right lower eyelid: Secondary | ICD-10-CM | POA: Diagnosis not present

## 2018-03-09 DIAGNOSIS — Z23 Encounter for immunization: Secondary | ICD-10-CM | POA: Diagnosis not present

## 2018-03-20 ENCOUNTER — Other Ambulatory Visit: Payer: Self-pay | Admitting: Internal Medicine

## 2018-03-20 DIAGNOSIS — Z1231 Encounter for screening mammogram for malignant neoplasm of breast: Secondary | ICD-10-CM

## 2018-04-28 DIAGNOSIS — E039 Hypothyroidism, unspecified: Secondary | ICD-10-CM | POA: Diagnosis not present

## 2018-04-28 DIAGNOSIS — D631 Anemia in chronic kidney disease: Secondary | ICD-10-CM | POA: Diagnosis not present

## 2018-04-28 DIAGNOSIS — N189 Chronic kidney disease, unspecified: Secondary | ICD-10-CM | POA: Diagnosis not present

## 2018-04-28 DIAGNOSIS — I129 Hypertensive chronic kidney disease with stage 1 through stage 4 chronic kidney disease, or unspecified chronic kidney disease: Secondary | ICD-10-CM | POA: Diagnosis not present

## 2018-04-28 DIAGNOSIS — I4891 Unspecified atrial fibrillation: Secondary | ICD-10-CM | POA: Diagnosis not present

## 2018-04-28 DIAGNOSIS — N183 Chronic kidney disease, stage 3 (moderate): Secondary | ICD-10-CM | POA: Diagnosis not present

## 2018-04-28 DIAGNOSIS — J479 Bronchiectasis, uncomplicated: Secondary | ICD-10-CM | POA: Diagnosis not present

## 2018-04-28 DIAGNOSIS — N2581 Secondary hyperparathyroidism of renal origin: Secondary | ICD-10-CM | POA: Diagnosis not present

## 2018-04-30 DIAGNOSIS — H17811 Minor opacity of cornea, right eye: Secondary | ICD-10-CM | POA: Diagnosis not present

## 2018-04-30 DIAGNOSIS — H04551 Acquired stenosis of right nasolacrimal duct: Secondary | ICD-10-CM | POA: Diagnosis not present

## 2018-04-30 DIAGNOSIS — H04123 Dry eye syndrome of bilateral lacrimal glands: Secondary | ICD-10-CM | POA: Diagnosis not present

## 2018-04-30 DIAGNOSIS — H04221 Epiphora due to insufficient drainage, right lacrimal gland: Secondary | ICD-10-CM | POA: Diagnosis not present

## 2018-04-30 DIAGNOSIS — H04541 Stenosis of right lacrimal canaliculi: Secondary | ICD-10-CM | POA: Diagnosis not present

## 2018-05-05 ENCOUNTER — Other Ambulatory Visit: Payer: Self-pay | Admitting: Ophthalmology

## 2018-05-05 DIAGNOSIS — H17811 Minor opacity of cornea, right eye: Secondary | ICD-10-CM | POA: Diagnosis not present

## 2018-05-05 DIAGNOSIS — H04551 Acquired stenosis of right nasolacrimal duct: Secondary | ICD-10-CM | POA: Diagnosis not present

## 2018-05-05 DIAGNOSIS — H04221 Epiphora due to insufficient drainage, right lacrimal gland: Secondary | ICD-10-CM | POA: Diagnosis not present

## 2018-05-05 DIAGNOSIS — H04561 Stenosis of right lacrimal punctum: Secondary | ICD-10-CM | POA: Diagnosis not present

## 2018-05-05 DIAGNOSIS — H04411 Chronic dacryocystitis of right lacrimal passage: Secondary | ICD-10-CM | POA: Diagnosis not present

## 2018-05-05 DIAGNOSIS — H04123 Dry eye syndrome of bilateral lacrimal glands: Secondary | ICD-10-CM | POA: Diagnosis not present

## 2018-05-05 DIAGNOSIS — H04541 Stenosis of right lacrimal canaliculi: Secondary | ICD-10-CM | POA: Diagnosis not present

## 2018-05-08 DIAGNOSIS — J449 Chronic obstructive pulmonary disease, unspecified: Secondary | ICD-10-CM | POA: Diagnosis not present

## 2018-05-08 DIAGNOSIS — M109 Gout, unspecified: Secondary | ICD-10-CM | POA: Diagnosis not present

## 2018-05-08 DIAGNOSIS — Z1389 Encounter for screening for other disorder: Secondary | ICD-10-CM | POA: Diagnosis not present

## 2018-05-08 DIAGNOSIS — I70209 Unspecified atherosclerosis of native arteries of extremities, unspecified extremity: Secondary | ICD-10-CM | POA: Diagnosis not present

## 2018-05-08 DIAGNOSIS — I480 Paroxysmal atrial fibrillation: Secondary | ICD-10-CM | POA: Diagnosis not present

## 2018-05-08 DIAGNOSIS — I129 Hypertensive chronic kidney disease with stage 1 through stage 4 chronic kidney disease, or unspecified chronic kidney disease: Secondary | ICD-10-CM | POA: Diagnosis not present

## 2018-05-08 DIAGNOSIS — E039 Hypothyroidism, unspecified: Secondary | ICD-10-CM | POA: Diagnosis not present

## 2018-05-08 DIAGNOSIS — E7800 Pure hypercholesterolemia, unspecified: Secondary | ICD-10-CM | POA: Diagnosis not present

## 2018-05-08 DIAGNOSIS — I700 Atherosclerosis of aorta: Secondary | ICD-10-CM | POA: Diagnosis not present

## 2018-05-08 DIAGNOSIS — N179 Acute kidney failure, unspecified: Secondary | ICD-10-CM | POA: Diagnosis not present

## 2018-05-19 DIAGNOSIS — Z09 Encounter for follow-up examination after completed treatment for conditions other than malignant neoplasm: Secondary | ICD-10-CM | POA: Diagnosis not present

## 2018-05-28 ENCOUNTER — Ambulatory Visit: Payer: Medicare Other | Admitting: Family

## 2018-05-28 ENCOUNTER — Encounter (HOSPITAL_COMMUNITY): Payer: Medicare Other

## 2018-06-25 ENCOUNTER — Encounter: Payer: Self-pay | Admitting: Family

## 2018-06-25 ENCOUNTER — Other Ambulatory Visit: Payer: Self-pay

## 2018-06-25 ENCOUNTER — Ambulatory Visit (HOSPITAL_COMMUNITY)
Admission: RE | Admit: 2018-06-25 | Discharge: 2018-06-25 | Disposition: A | Discharge status: Home / Self Care | Payer: Medicare Other | Source: Ambulatory Visit | Attending: Family | Admitting: Family

## 2018-06-25 ENCOUNTER — Ambulatory Visit (INDEPENDENT_AMBULATORY_CARE_PROVIDER_SITE_OTHER): Payer: Medicare Other | Admitting: Family

## 2018-06-25 VITALS — BP 106/67 | HR 68 | Temp 97.9°F | Resp 20 | Ht 65.0 in | Wt 108.0 lb

## 2018-06-25 DIAGNOSIS — I773 Arterial fibromuscular dysplasia: Secondary | ICD-10-CM

## 2018-06-25 DIAGNOSIS — I779 Disorder of arteries and arterioles, unspecified: Secondary | ICD-10-CM | POA: Diagnosis not present

## 2018-06-25 NOTE — Progress Notes (Signed)
Chief Complaint: Follow up peripheral artery occlusive disease    History of Present Illness  Deborah Shields is a 78 y.o. female whom Dr. [name] has been monitoring for fibromuscular dysplasia of carotid arteries and peripheral artery occlusive disease.   She returns today for scheduled surveillance.  She denies ever having a stroke or TIA symptoms. Specifically she denies a history of: amaurosis fugax or monocular blindness,unilateral facial drooping, hemiplegia, or receptive or expressive aphasia.  She denies claudication sx's with walking, denies non-healing wounds.   Sees a cardiologist, Dr. Orene Desanctis, due to family hx of CAD and personal hx of murmur.   She had corticosteroid injections in her T-spine, Dr. Maryjean Ka, for pain in the left shoulder, tingling and numbness in left arm/hand which helped. She had [place]otted Feverin the Summer 2017.   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   Dr. [name] last evaluated pt on 09-11-17. At that time patient had a lower extremity arterial duplex which showed monophasic waveforms in the right superficial femoral artery which may represent a short segment occlusion. Left lower extremity had triphasic waveforms all the way down with the exception of her anterior and posterior tibial arteries which were patent but had biphasic waveform suggesting some mild occlusive disease. Bilateral foot pain most likely not secondary to arterial occlusive disease. She certainly does not have rest pain. She does have findings of arterial occlusive disease in both lower extremities right worse than left. However, Dr. [name] did not believe that she was at risk of limb loss with her state of perfusion at that time. If she developed rest pain or nonhealing wounds we would consider pursuing this further with an arteriogram. However with her history of renal dysfunction Dr. [name] would not consider an arteriogram for her with  the possibility of worsening renal function for lifestyle symptoms alone unless these were very debilitating. She did not feel disabled by her symptoms at that time. Patient was to follow-up with our nurse practitioner in [phone] for her carotid surveillance exam. We will add on bilateral lower extremity duplex exam as well to see if there is any difference. If it is stable the most likely no further workup indicated unless she develops rest pain or nonhealing wounds. This was discussed with the patient and her daughter that day. They were reassured that lifetime risk of limb loss even with perfusion down as low as 50% is less than 5% lifetime since she really has no significant risk factors.  She states her left foot and lower left leg feel numb, her right foot does not bother her.  Pt was taken off the gout medication due to her rheumatologist surmising that she does not have gout, that she has lack of arterial perfusion in her feet.   She walks at least 30 minutes daily in the course of her day.   Pt states her back problem was "toe knee short leg", addressed by Dr. Rita Ohara with surgery.    Diabetic: No Tobacco use: non-smoker; however, her husband smoked 3 ppd in their home, he died in 09/15/1994; and she was exposed to secondhand smoke at her workplace for 40 years; all men smoked.   Pt meds include:  Statin : No: all statins tried caused very elevated liver and muscle enzymes and muscle weakness/severe cramping  Betablocker: Yes  ASA: Yes  Other anticoagulants/antiplatelets: no    Past Medical History:  Diagnosis Date  . Anxiety   . Aortic insufficiency    mild  due to degenerative changes  . Arthritis   . Basal cell carcinoma 2007   [facility] Left leg  . Carotid artery occlusion   . Chronic kidney disease    CRD Stage 3  . Conjunctivitis due to adenovirus, both eyes   . Degenerative joint disease   . Diverticulosis of colon   . Dyspnea    with exertion  .  Heart murmur   . Hyperlipidemia    NMR 07/2009: LDL 200 (2260/1233)TG 99, HDL 65. LDL goal =<120, ideally <100. father MI @ 76  . Hypertension   . Hypothyroidism    Dr Wilson Singer  . Microscopic hematuria   . Peripheral neuropathy    compressive in UE bilaterally; Dr Daylene Katayama  . Peripheral vascular disease ([place])    ICA bilat, Dr.Charles fields, [facility]  . Pneumonia    2017  . PONV (postoperative nausea and vomiting)    "Inner ear, does  okay with Scopolamine"  . Rectocele   . Rocky [place] spotted fever     Social History Social History   Tobacco Use  . Smoking status: Never Smoker  . Smokeless tobacco: Never Used  Substance Use Topics  . Alcohol use: No    Alcohol/week: 0.0 standard drinks  . Drug use: No    Family History Family History  Problem Relation Age of Onset  . Heart attack Father 79  . Heart disease Father        Before age 20  . Colon cancer Mother 22  . Stroke Mother 63  . Kidney disease Mother        ? renal calculi; S/P resecton of kidney  . Cancer - Colon Mother 2  . Ovarian cancer Sister   . Other Sister        Valve replacement (aortic & mitral ) in 2 sisters  . Heart disease Sister        Before age 52  . Heart disease Brother        aortic & mitral valve replacement in 2 bro; both had CBAG  . Diabetes Neg Hx   . Breast cancer Neg Hx     Surgical History Past Surgical History:  Procedure Laterality Date  . ABDOMINAL HYSTERECTOMY  1973   age 86 due to dysfuctional menses; HRT x 25-30 years  . APPENDECTOMY  1952  . basal cell cancer  03/2006   leg  . BILATERAL OOPHORECTOMY  1990   prophylactically (sister had ovarian ca)  . CARPAL TUNNEL RELEASE Bilateral 1989   right  . CATARACT EXTRACTION, BILATERAL  2010   Dr Kathrin Penner  . CHOLECYSTECTOMY  2006  . COLONOSCOPY     Dr Ardis Hughs  . EYE SURGERY Left    Bilateral eye (film removed lt eye 01/09/17)  . LUMBAR LAMINECTOMY/DECOMPRESSION MICRODISCECTOMY Left 03/20/2017   Procedure: LEFT LUMBAR  FOURLUMBAR FIVE LAMINOTOMY AND MICROICRODISCECTOMY 1 LEVEL;  Surgeon: Jovita Gamma, MD;  Location: [facility];  Service: Neurosurgery;  Laterality: Left;  LEFT  . NM MYOVIEW LTD  06/13/2008   low risk scan  . ORTHOPEDIC SURGERY  1989/200/2002/2012   elbows,shoulder surgery x 3, right hand  . RECTOCELE REPAIR  2016  . TONSILLECTOMY  1957  . US ECHOCARDIOGRAPHY  05/01/2010   trace MR,AI,TR;EF =>55%  . VIDEO BRONCHOSCOPY Bilateral 07/17/2017   Procedure: VIDEO BRONCHOSCOPY WITHOUT FLUORO;  Surgeon: Rigoberto Noel, MD;  Location: [facility];  Service: Cardiopulmonary;  Laterality: Bilateral;  . VIDEO BRONCHOSCOPY Bilateral 09/02/2017   Procedure: VIDEO BRONCHOSCOPY  WITHOUT FLUORO;  Surgeon: Rigoberto Noel, MD;  Location: [facility];  Service: Cardiopulmonary;  Laterality: Bilateral;    Allergies  Allergen Reactions  . Aspirin Nausea Only and Other (See Comments)    REACTION: GI upset  ( pt can take 81 mg but NOT   325 mg ASA )  . Lovastatin Nausea Only and Other (See Comments)    REACTION: nausea  . Benazepril Hcl Other (See Comments)    Unknown  . Paroxetine Other (See Comments)    Unknown  . Bupropion Hcl Other (See Comments)    REACTION: tinnitis  . Ezetimibe Nausea And Vomiting and Other (See Comments)    REACTION: GI symptoms  . Fenofibrate Other (See Comments)    Myalgia   . Pravastatin Sodium Other (See Comments)    REACTION: elevated CPK - Muscle's in bilateral Leg    Current Outpatient Medications  Medication Sig Dispense Refill  . b complex vitamins tablet Take 1 tablet by mouth daily after supper.     . cyclobenzaprine (FLEXERIL) 10 MG tablet Take 10 mg by mouth 2 (two) times daily as needed for muscle spasms (scheduled every every).     Marland Kitchen diclofenac sodium (VOLTAREN) 1 % GEL Apply 2-4 g topically 4 (four) times daily as needed (for pain).     . FIBER PO Take 1 tablet by mouth daily.    Marland Kitchen levothyroxine (SYNTHROID) 88 MCG tablet Take 88 mcg by mouth daily before  breakfast. Taking the brand "synthroid"    . LORazepam (ATIVAN) 0.5 MG tablet Take 1 tablet (0.5 mg total) by mouth 3 (three) times daily as needed. (Patient taking differently: Take 0.5 mg by mouth at bedtime. ) 90 tablet 1  . meclizine (ANTIVERT) 25 MG tablet Take 1 tablet (25 mg total) by mouth daily. 1/2 every 8 hrs prn for imbalance (Patient taking differently: Take 12.5-25 mg by mouth at bedtime. ) 30 tablet 2  . metoprolol succinate (TOPROL-XL) 50 MG 24 hr tablet Take 25 mg by mouth 2 (two) times daily after a meal. After breakfast & after supper    . Multiple Vitamins-Minerals (CENTRUM SILVER PO) Take 1 tablet by mouth daily after supper.     . Probiotic Product (PROBIOTIC ADVANCED PO) Take 1 capsule by mouth daily after breakfast. VSL #3    . psyllium (REGULOID) 0.52 g capsule Take 0.52 g by mouth daily. FIBER    . traMADol (ULTRAM) 50 MG tablet 1/2-1 by mouth once daily as needed. (Patient taking differently: Take 50 mg by mouth 2 (two) times daily as needed for moderate pain or severe pain (1 tablet by mouth scheduled each bedtime.). ) 90 tablet 1   No current facility-administered medications for this visit.     Review of Systems : See HPI for pertinent positives and negatives.  Physical Examination  Vitals:   06/25/18 0951  BP: 106/67  Pulse: 68  Resp: 20  Temp: 97.9 F (36.6 C)  SpO2: 97%  Weight: 108 lb (49 kg)  Height: 5\' 5"  (1.651 m)   Body mass index is 17.97 kg/m.  General: WDWN petite female in NAD Gait: Normal HENT: WNL Eyes: PERRLA Pulmonary: normal non-labored breathing, good air movement in all fields, CTAB, no rales, rhonchi, or wheezing Cardiac: RRR, no murmur detected Abdomen: soft, NT, no masses palpated Skin: no rashes, no ulcers, no cellulitis.   VASCULAR EXAM Carotid Bruits Right Left   Negative  Negative    Abdominal aortic pulse is not palpable.  Radial  pulses are 2+ palpable and equal.   LE Pulses  Right Left    FEMORAL 2+ palpable 2+ palpable  POPLITEAL  not palpable  not palpable  POSTERIOR TIBIAL  2+ palpable  1+palpable   DORSALIS PEDIS ANTERIOR TIBIAL  1+ palpable  not palpable    Musculoskeletal: No muscle atrophy/wasting. M/S 5/5 throughout, Extremities without ischemic changes. Forefeet at dorsal aspects and all toes with dependent rubor. Capillary refill in right toes is <3 seconds,seconds. No ulcers, no open wounds, no gangrene.  Neurologic:  A&O X 3; appropriate affect, sensation is normal; speech is normal, CN 2-12 intact, pain and light touch intact in extremities, motor exam as listed above. Psychiatric: Normal thought content, mood appropriate to clinical situation.    Assessment: Deborah Shields is a 78 y.o. female who presents with asymptomatic bilateral ICA stenosis c/w fibromuscular dysplasia.  She has peripheral artery occlusive disease with no claudication sx's, no signs of ischemia in her feet or legs other than dependent rubor in both feet.   Her atherosclerotic risk factors include secondhand smoke exposure for 40+ years at works and at home, although she never used tobacco. She also has a statin intolerance. Fortunately she does not have DM.   Review of records: in April 2014 carotid Duplex suggested 60-79% bilateral ICA stenoses.  At her 11-20-17 visit, Dr. [name] reviewed pt bilateral LE arterial duplex results, we discussed her HPI and physical exam results. Dr. [name] spoke with pt. There was a 347 cm/s velocity in her right EIA, her right leg is asymptomatic. No significant stenosis in the arteries of her left leg.  Dr. [name] offered pt an arteriogram to try to determine where stenoses might be, that could possibly be amenable to revascularization. Pt declined at that time, states she will let us know if she changes her mind.     DATA  ABI (Date: 06/25/2018):  R:   ABI: [place] (was [place] on 08-20-17),   PT: tri  DP: tri  TBI:  0.6,  was 0.0, toe pressure 75  L:   ABI: [place] (was [place]),   PT: tri  DP: tri  TBI: 0.98, toe pressure 123 Bilateral ABI with non compressible vessels, all triphasic waveforms which is a significant improvement from all monophasic waveforms on 08-20-17 when the vessels were also non compressible.  Significant improvement in bilateral TBI and toe pressures.    Carotid Duplex (11/20/17); 40-59% bilateral ICA stenosis at the distal segments, tortuosity c/w fibromuscular dysplasia.  Bilateral vertebral artery flow is antegrade.  Bilateral subclavian artery waveforms are normal.  No significant change compared to the exam on 11-14-16.    Bilateral LE Arterial duplex (11/20/17): Right: Focal velocity of 347 cm/s in the mid EIA with post stenotic turbulence, 50-75% stenosis.All monophasic waveforms except at [facility] which is triphasic.  Left: No significant stenosis, bi and triphasic waveforms.     PLAN:   Based on today's exam and non-invasive vascular lab results, the patient will follow up in 6 months with the following test: ABI and carotid duplex.  I advised pt to notify us if she develops concerns re the circulation in her feet or legs.   I discussed in depth with the patient the nature of atherosclerosis, and emphasized the importance of maximal medical management including strict control of blood pressure, blood glucose, and lipid levels, obtaining regular exercise, and continued cessation of smoking.  The patient is aware that without maximal medical management the underlying atherosclerotic disease process will progress, limiting  the benefit of any interventions. The patient was given information about stroke prevention and what symptoms should prompt the patient to seek immediate medical care. Thank you for allowing [place] to participate in this patient's care.  Clemon Chambers, RN, MSN, FNP-C [facility] Office: [phone]  Clinic Physician: Scot Dock in  Vein clinic, Early on call  06/25/18 10:11 AM

## 2018-06-25 NOTE — Patient Instructions (Signed)
Stroke Prevention Some medical conditions and lifestyle choices can lead to a higher risk for a stroke. You can help to prevent a stroke by making nutrition, lifestyle, and other changes. What nutrition changes can be made?   Eat healthy foods. ? Choose foods that are high in fiber. These include:  Fresh fruits.  Fresh vegetables.  Whole grains. ? Eat at least 5 or more servings of fruits and vegetables each day. Try to fill half of your plate at each meal with fruits and vegetables. ? Choose lean protein foods. These include:  Lowfat (lean) cuts of meat.  Chicken without skin.  Fish.  Tofu.  Beans.  Nuts. ? Eat low-fat dairy products. ? Avoid foods that:  Are high in salt (sodium).  Have saturated fat.  Have trans fat.  Have cholesterol.  Are processed.  Are premade.  Follow eating guidelines as told by your doctor. These may include: ? Reducing how many calories you eat and drink each day. ? Limiting how much salt you eat or drink each day to 1,500 milligrams (mg). ? Using only healthy fats for cooking. These include:  Olive oil.  Canola oil.  Sunflower oil. ? Counting how many carbohydrates you eat and drink each day. What lifestyle changes can be made?  Try to stay at a healthy weight. Talk to your doctor about what a good weight is for you.  Get at least 30 minutes of moderate physical activity at least 5 days a week. This can include: ? Fast walking. ? Biking. ? Swimming.  Do not use any products that have nicotine or tobacco. This includes cigarettes and e-cigarettes. If you need help quitting, ask your doctor. Avoid being around tobacco smoke in general.  Limit how much alcohol you drink to no more than 1 drink a day for nonpregnant women and 2 drinks a day for men. One drink equals 12 oz of beer, 5 oz of wine, or 1 oz of hard liquor.  Do not use drugs.  Avoid taking birth control pills. Talk to your doctor about the risks of taking birth  control pills if: ? You are over 35 years old. ? You smoke. ? You get migraines. ? You have had a blood clot. What other changes can be made?  Manage your cholesterol. ? It is important to eat a healthy diet. ? If your cholesterol cannot be managed through your diet, you may also need to take medicines. Take medicines as told by your doctor.  Manage your diabetes. ? It is important to eat a healthy diet and to exercise regularly. ? If your blood sugar cannot be managed through diet and exercise, you may need to take medicines. Take medicines as told by your doctor.  Control your high blood pressure (hypertension). ? Try to keep your blood pressure below 130/80. This can help lower your risk of stroke. ? It is important to eat a healthy diet and to exercise regularly. ? If your blood pressure cannot be managed through diet and exercise, you may need to take medicines. Take medicines as told by your doctor. ? Ask your doctor if you should check your blood pressure at home. ? Have your blood pressure checked every year. Do this even if your blood pressure is normal.  Talk to your doctor about getting checked for a sleep disorder. Signs of this can include: ? Snoring a lot. ? Feeling very tired.  Take over-the-counter and prescription medicines only as told by your doctor. These may   include aspirin or blood thinners (antiplatelets or anticoagulants).  Make sure that any other medical conditions you have are managed. Where to find more information  American Stroke Association: www.strokeassociation.org  National Stroke Association: www.stroke.org Get help right away if:  You have any symptoms of stroke. "BE FAST" is an easy way to remember the main warning signs: ? B - Balance. Signs are dizziness, sudden trouble walking, or loss of balance. ? E - Eyes. Signs are trouble seeing or a sudden change in how you see. ? F - Face. Signs are sudden weakness or loss of feeling of the face,  or the face or eyelid drooping on one side. ? A - Arms. Signs are weakness or loss of feeling in an arm. This happens suddenly and usually on one side of the body. ? S - Speech. Signs are sudden trouble speaking, slurred speech, or trouble understanding what people say. ? T - Time. Time to call emergency services. Write down what time symptoms started.  You have other signs of stroke, such as: ? A sudden, very bad headache with no known cause. ? Feeling sick to your stomach (nausea). ? Throwing up (vomiting). ? Jerky movements you cannot control (seizure). These symptoms may represent a serious problem that is an emergency. Do not wait to see if the symptoms will go away. Get medical help right away. Call your local emergency services (911 in the [place] Do not drive yourself to the hospital. Summary  You can prevent a stroke by eating healthy, exercising, not smoking, drinking less alcohol, and treating other health problems, such as diabetes, high blood pressure, or high cholesterol.  Do not use any products that contain nicotine or tobacco, such as cigarettes and e-cigarettes.  Get help right away if you have any signs or symptoms of a stroke. This information is not intended to replace advice given to you by your health care provider. Make sure you discuss any questions you have with your health care provider. Document Released: 12/10/2011 Document Revised: 09/11/2016 Document Reviewed: 09/11/2016 Elsevier Interactive Patient Education  2019 Elsevier Inc.     Peripheral Vascular Disease  Peripheral vascular disease (PVD) is a disease of the blood vessels that are not part of your heart and brain. A simple term for PVD is poor circulation. In most cases, PVD narrows the blood vessels that carry blood from your heart to the rest of your body. This can reduce the supply of blood to your arms, legs, and internal organs, like your stomach or kidneys. However, PVD most often affects a  person's lower legs and feet. Without treatment, PVD tends to get worse. PVD can also lead to acute ischemic limb. This is when an arm or leg suddenly cannot get enough blood. This is a medical emergency. Follow these instructions at home: Lifestyle  Do not use any products that contain nicotine or tobacco, such as cigarettes and e-cigarettes. If you need help quitting, ask your doctor.  Lose weight if you are overweight. Or, stay at a healthy weight as told by your doctor.  Eat a diet that is low in fat and cholesterol. If you need help, ask your doctor.  Exercise regularly. Ask your doctor for activities that are right for you. General instructions  Take over-the-counter and prescription medicines only as told by your doctor.  Take good care of your feet: ? Wear comfortable shoes that fit well. ? Check your feet often for any cuts or sores.  Keep all follow-up visits as   told by your doctor This is important. Contact a doctor if:  You have cramps in your legs when you walk.  You have leg pain when you are at rest.  You have coldness in a leg or foot.  Your skin changes.  You are unable to get or have an erection (erectile dysfunction).  You have cuts or sores on your feet that do not heal. Get help right away if:  Your arm or leg turns cold, numb, and blue.  Your arms or legs become red, warm, swollen, painful, or numb.  You have chest pain.  You have trouble breathing.  You suddenly have weakness in your face, arm, or leg.  You become very confused or you cannot speak.  You suddenly have a very bad headache.  You suddenly cannot see. Summary  Peripheral vascular disease (PVD) is a disease of the blood vessels.  A simple term for PVD is poor circulation. Without treatment, PVD tends to get worse.  Treatment may include exercise, low fat and low cholesterol diet, and quitting smoking. This information is not intended to replace advice given to you by your  health care provider. Make sure you discuss any questions you have with your health care provider. Document Released: 09/04/2009 Document Revised: 07/18/2016 Document Reviewed: 07/18/2016 Elsevier Interactive Patient Education  2019 Elsevier Inc.  

## 2018-07-02 DIAGNOSIS — H04123 Dry eye syndrome of bilateral lacrimal glands: Secondary | ICD-10-CM | POA: Diagnosis not present

## 2018-07-02 DIAGNOSIS — Z09 Encounter for follow-up examination after completed treatment for conditions other than malignant neoplasm: Secondary | ICD-10-CM | POA: Diagnosis not present

## 2018-07-02 DIAGNOSIS — H04551 Acquired stenosis of right nasolacrimal duct: Secondary | ICD-10-CM | POA: Diagnosis not present

## 2018-07-02 DIAGNOSIS — H04221 Epiphora due to insufficient drainage, right lacrimal gland: Secondary | ICD-10-CM | POA: Diagnosis not present

## 2018-07-02 DIAGNOSIS — H04541 Stenosis of right lacrimal canaliculi: Secondary | ICD-10-CM | POA: Diagnosis not present

## 2018-07-06 ENCOUNTER — Ambulatory Visit: Payer: Medicare Other

## 2018-07-06 ENCOUNTER — Ambulatory Visit
Admission: RE | Admit: 2018-07-06 | Discharge: 2018-07-06 | Disposition: A | Discharge status: Home / Self Care | Payer: Medicare Other | Source: Ambulatory Visit | Attending: Geriatric Medicine | Admitting: Geriatric Medicine

## 2018-07-06 DIAGNOSIS — Z1231 Encounter for screening mammogram for malignant neoplasm of breast: Secondary | ICD-10-CM

## 2018-07-15 DIAGNOSIS — H04221 Epiphora due to insufficient drainage, right lacrimal gland: Secondary | ICD-10-CM | POA: Diagnosis not present

## 2018-07-15 DIAGNOSIS — H109 Unspecified conjunctivitis: Secondary | ICD-10-CM | POA: Diagnosis not present

## 2018-07-15 DIAGNOSIS — H04551 Acquired stenosis of right nasolacrimal duct: Secondary | ICD-10-CM | POA: Diagnosis not present

## 2018-08-16 ENCOUNTER — Inpatient Hospital Stay (HOSPITAL_COMMUNITY)
Admission: EM | Admit: 2018-08-16 | Discharge: 2018-08-21 | DRG: 871 | Disposition: A | Discharge status: Home Health | Payer: Medicare Other | Source: Ambulatory Visit | Attending: Internal Medicine | Admitting: Internal Medicine

## 2018-08-16 ENCOUNTER — Emergency Department (HOSPITAL_COMMUNITY): Payer: Medicare Other

## 2018-08-16 ENCOUNTER — Encounter (HOSPITAL_COMMUNITY): Payer: Self-pay | Admitting: Emergency Medicine

## 2018-08-16 ENCOUNTER — Other Ambulatory Visit: Payer: Self-pay

## 2018-08-16 DIAGNOSIS — Z79890 Hormone replacement therapy (postmenopausal): Secondary | ICD-10-CM

## 2018-08-16 DIAGNOSIS — Z8701 Personal history of pneumonia (recurrent): Secondary | ICD-10-CM | POA: Diagnosis not present

## 2018-08-16 DIAGNOSIS — I10 Essential (primary) hypertension: Secondary | ICD-10-CM | POA: Diagnosis not present

## 2018-08-16 DIAGNOSIS — J181 Lobar pneumonia, unspecified organism: Secondary | ICD-10-CM | POA: Diagnosis not present

## 2018-08-16 DIAGNOSIS — Z800 Family history of malignant neoplasm of digestive organs: Secondary | ICD-10-CM | POA: Diagnosis not present

## 2018-08-16 DIAGNOSIS — I351 Nonrheumatic aortic (valve) insufficiency: Secondary | ICD-10-CM | POA: Diagnosis present

## 2018-08-16 DIAGNOSIS — E876 Hypokalemia: Secondary | ICD-10-CM | POA: Diagnosis not present

## 2018-08-16 DIAGNOSIS — Z886 Allergy status to analgesic agent status: Secondary | ICD-10-CM

## 2018-08-16 DIAGNOSIS — E039 Hypothyroidism, unspecified: Secondary | ICD-10-CM | POA: Diagnosis present

## 2018-08-16 DIAGNOSIS — N183 Chronic kidney disease, stage 3 unspecified: Secondary | ICD-10-CM | POA: Diagnosis present

## 2018-08-16 DIAGNOSIS — J439 Emphysema, unspecified: Secondary | ICD-10-CM | POA: Diagnosis present

## 2018-08-16 DIAGNOSIS — Z86718 Personal history of other venous thrombosis and embolism: Secondary | ICD-10-CM | POA: Diagnosis not present

## 2018-08-16 DIAGNOSIS — M8580 Other specified disorders of bone density and structure, unspecified site: Secondary | ICD-10-CM | POA: Diagnosis present

## 2018-08-16 DIAGNOSIS — Z8249 Family history of ischemic heart disease and other diseases of the circulatory system: Secondary | ICD-10-CM

## 2018-08-16 DIAGNOSIS — F419 Anxiety disorder, unspecified: Secondary | ICD-10-CM | POA: Diagnosis present

## 2018-08-16 DIAGNOSIS — Z8619 Personal history of other infectious and parasitic diseases: Secondary | ICD-10-CM

## 2018-08-16 DIAGNOSIS — Z79891 Long term (current) use of opiate analgesic: Secondary | ICD-10-CM

## 2018-08-16 DIAGNOSIS — J189 Pneumonia, unspecified organism: Secondary | ICD-10-CM | POA: Diagnosis not present

## 2018-08-16 DIAGNOSIS — I248 Other forms of acute ischemic heart disease: Secondary | ICD-10-CM | POA: Diagnosis present

## 2018-08-16 DIAGNOSIS — I739 Peripheral vascular disease, unspecified: Secondary | ICD-10-CM | POA: Diagnosis present

## 2018-08-16 DIAGNOSIS — I480 Paroxysmal atrial fibrillation: Secondary | ICD-10-CM | POA: Diagnosis not present

## 2018-08-16 DIAGNOSIS — A4102 Sepsis due to Methicillin resistant Staphylococcus aureus: Principal | ICD-10-CM | POA: Diagnosis present

## 2018-08-16 DIAGNOSIS — A419 Sepsis, unspecified organism: Secondary | ICD-10-CM | POA: Diagnosis present

## 2018-08-16 DIAGNOSIS — J168 Pneumonia due to other specified infectious organisms: Secondary | ICD-10-CM | POA: Diagnosis not present

## 2018-08-16 DIAGNOSIS — Z79899 Other long term (current) drug therapy: Secondary | ICD-10-CM

## 2018-08-16 DIAGNOSIS — Z823 Family history of stroke: Secondary | ICD-10-CM

## 2018-08-16 DIAGNOSIS — Z888 Allergy status to other drugs, medicaments and biological substances status: Secondary | ICD-10-CM

## 2018-08-16 DIAGNOSIS — J15212 Pneumonia due to Methicillin resistant Staphylococcus aureus: Secondary | ICD-10-CM

## 2018-08-16 DIAGNOSIS — E782 Mixed hyperlipidemia: Secondary | ICD-10-CM | POA: Diagnosis not present

## 2018-08-16 DIAGNOSIS — G629 Polyneuropathy, unspecified: Secondary | ICD-10-CM | POA: Diagnosis present

## 2018-08-16 DIAGNOSIS — N184 Chronic kidney disease, stage 4 (severe): Secondary | ICD-10-CM | POA: Diagnosis present

## 2018-08-16 DIAGNOSIS — I129 Hypertensive chronic kidney disease with stage 1 through stage 4 chronic kidney disease, or unspecified chronic kidney disease: Secondary | ICD-10-CM | POA: Diagnosis present

## 2018-08-16 DIAGNOSIS — Z86711 Personal history of pulmonary embolism: Secondary | ICD-10-CM

## 2018-08-16 DIAGNOSIS — R0789 Other chest pain: Secondary | ICD-10-CM | POA: Diagnosis not present

## 2018-08-16 DIAGNOSIS — R079 Chest pain, unspecified: Secondary | ICD-10-CM | POA: Diagnosis not present

## 2018-08-16 DIAGNOSIS — R000 Tachycardia, unspecified: Secondary | ICD-10-CM | POA: Diagnosis not present

## 2018-08-16 DIAGNOSIS — I2510 Atherosclerotic heart disease of native coronary artery without angina pectoris: Secondary | ICD-10-CM | POA: Diagnosis present

## 2018-08-16 DIAGNOSIS — Z791 Long term (current) use of non-steroidal anti-inflammatories (NSAID): Secondary | ICD-10-CM

## 2018-08-16 DIAGNOSIS — Z8041 Family history of malignant neoplasm of ovary: Secondary | ICD-10-CM

## 2018-08-16 DIAGNOSIS — E785 Hyperlipidemia, unspecified: Secondary | ICD-10-CM | POA: Diagnosis present

## 2018-08-16 DIAGNOSIS — Z841 Family history of disorders of kidney and ureter: Secondary | ICD-10-CM | POA: Diagnosis not present

## 2018-08-16 DIAGNOSIS — R0602 Shortness of breath: Secondary | ICD-10-CM | POA: Diagnosis not present

## 2018-08-16 LAB — CBC
HCT: 42.9 % (ref 36.0–46.0)
Hemoglobin: 13.6 g/dL (ref 12.0–15.0)
MCH: 28.9 pg (ref 26.0–34.0)
MCHC: 31.7 g/dL (ref 30.0–36.0)
MCV: 91.3 fL (ref 80.0–100.0)
Platelets: 291 10*3/uL (ref 150–400)
RBC: 4.7 MIL/uL (ref 3.87–5.11)
RDW: 14.7 % (ref 11.5–15.5)
WBC: 20.6 10*3/uL — ABNORMAL HIGH (ref 4.0–10.5)
nRBC: 0 % (ref 0.0–0.2)

## 2018-08-16 LAB — BASIC METABOLIC PANEL
Anion gap: 11 (ref 5–15)
BUN: 23 mg/dL (ref 8–23)
CO2: 26 mmol/L (ref 22–32)
Calcium: 9.1 mg/dL (ref 8.9–10.3)
Chloride: 99 mmol/L (ref 98–111)
Creatinine, Ser: 1.59 mg/dL — ABNORMAL HIGH (ref 0.44–1.00)
GFR calc Af Amer: 35 mL/min — ABNORMAL LOW (ref >60)
GFR calc non Af Amer: 31 mL/min — ABNORMAL LOW (ref >60)
Glucose, Bld: 101 mg/dL — ABNORMAL HIGH (ref 70–99)
Potassium: 3.8 mmol/L (ref 3.5–5.1)
Sodium: 136 mmol/L (ref 135–145)

## 2018-08-16 LAB — HEPATIC FUNCTION PANEL
ALT: 20 U/L (ref 0–44)
AST: 36 U/L (ref 15–41)
Albumin: 3.7 g/dL (ref 3.5–5.0)
Alkaline Phosphatase: 142 U/L — ABNORMAL HIGH (ref 38–126)
Bilirubin, Direct: 0.4 mg/dL — ABNORMAL HIGH (ref 0.0–0.2)
Indirect Bilirubin: 1.5 mg/dL — ABNORMAL HIGH (ref 0.3–0.9)
Total Bilirubin: 1.9 mg/dL — ABNORMAL HIGH (ref 0.3–1.2)
Total Protein: 7.7 g/dL (ref 6.5–8.1)

## 2018-08-16 LAB — POCT I-STAT TROPONIN I: Troponin i, poc: 0.54 ng/mL (ref 0.00–0.08)

## 2018-08-16 LAB — LACTIC ACID, PLASMA
Lactic Acid, Venous: 3.1 mmol/L (ref 0.5–1.9)
Lactic Acid, Venous: 3.2 mmol/L (ref 0.5–1.9)

## 2018-08-16 LAB — LIPASE, BLOOD: Lipase: 39 U/L (ref 11–51)

## 2018-08-16 MED ORDER — MECLIZINE HCL 12.5 MG PO TABS
12.5000 mg | ORAL_TABLET | Freq: Every day | ORAL | Status: DC
  Administered 2018-08-16 – 2018-08-17 (×2): 25 mg via ORAL
  Administered 2018-08-18: 12.5 mg via ORAL
  Administered 2018-08-19 – 2018-08-20 (×2): 25 mg via ORAL
  Filled 2018-08-16 (×5): qty 2

## 2018-08-16 MED ORDER — SODIUM CHLORIDE 0.9 % IV SOLN
500.0000 mg | INTRAVENOUS | Status: DC
  Administered 2018-08-17 – 2018-08-18 (×2): 500 mg via INTRAVENOUS
  Filled 2018-08-16 (×2): qty 500

## 2018-08-16 MED ORDER — ACETAMINOPHEN 325 MG PO TABS
325.0000 mg | ORAL_TABLET | Freq: Four times a day (QID) | ORAL | Status: DC | PRN
  Administered 2018-08-17 – 2018-08-20 (×2): 325 mg via ORAL
  Filled 2018-08-16 (×2): qty 1

## 2018-08-16 MED ORDER — IOPAMIDOL (ISOVUE-370) INJECTION 76%
INTRAVENOUS | Status: AC
  Filled 2018-08-16: qty 100

## 2018-08-16 MED ORDER — ADULT MULTIVITAMIN W/MINERALS CH
ORAL_TABLET | Freq: Every day | ORAL | Status: DC
  Administered 2018-08-17 – 2018-08-20 (×4): 1 via ORAL
  Filled 2018-08-16 (×4): qty 1

## 2018-08-16 MED ORDER — CALCIUM POLYCARBOPHIL 625 MG PO TABS
ORAL_TABLET | Freq: Every day | ORAL | Status: DC
  Administered 2018-08-16 – 2018-08-21 (×6): via ORAL
  Filled 2018-08-16 (×6): qty 1

## 2018-08-16 MED ORDER — CYCLOBENZAPRINE HCL 10 MG PO TABS: 10.0000 mg | ORAL_TABLET | Freq: Two times a day (BID) | ORAL | Status: DC | PRN

## 2018-08-16 MED ORDER — B COMPLEX-C PO TABS
1.0000 | ORAL_TABLET | Freq: Every day | ORAL | Status: DC
  Administered 2018-08-17 – 2018-08-20 (×4): 1 via ORAL
  Filled 2018-08-16 (×4): qty 1

## 2018-08-16 MED ORDER — SODIUM CHLORIDE 0.9 % IV SOLN
INTRAVENOUS | Status: DC
  Administered 2018-08-16: 23:00:00 via INTRAVENOUS

## 2018-08-16 MED ORDER — RISAQUAD PO CAPS
ORAL_CAPSULE | Freq: Every day | ORAL | Status: DC
  Administered 2018-08-17 – 2018-08-21 (×5): 1 via ORAL
  Filled 2018-08-16 (×5): qty 1

## 2018-08-16 MED ORDER — SENNA 8.6 MG PO TABS
1.0000 | ORAL_TABLET | Freq: Every day | ORAL | Status: DC
  Administered 2018-08-16 – 2018-08-21 (×6): 8.6 mg via ORAL
  Filled 2018-08-16 (×6): qty 1

## 2018-08-16 MED ORDER — SODIUM CHLORIDE 0.9 % IV SOLN
INTRAVENOUS | Status: DC
  Administered 2018-08-17 (×2): via INTRAVENOUS

## 2018-08-16 MED ORDER — SODIUM CHLORIDE 0.9 % IV SOLN
1.0000 g | Freq: Once | INTRAVENOUS | Status: AC
  Administered 2018-08-16: 1 g via INTRAVENOUS
  Filled 2018-08-16: qty 10

## 2018-08-16 MED ORDER — LORAZEPAM 0.5 MG PO TABS
0.5000 mg | ORAL_TABLET | Freq: Every day | ORAL | Status: DC
  Administered 2018-08-16 – 2018-08-20 (×5): 0.5 mg via ORAL
  Filled 2018-08-16 (×5): qty 1

## 2018-08-16 MED ORDER — METOPROLOL SUCCINATE ER 25 MG PO TB24
25.0000 mg | ORAL_TABLET | Freq: Two times a day (BID) | ORAL | Status: DC
  Administered 2018-08-17 – 2018-08-21 (×9): 25 mg via ORAL
  Filled 2018-08-16 (×9): qty 1

## 2018-08-16 MED ORDER — IOPAMIDOL (ISOVUE-370) INJECTION 76%
100.0000 mL | Freq: Once | INTRAVENOUS | Status: AC | PRN
  Administered 2018-08-16: 100 mL via INTRAVENOUS

## 2018-08-16 MED ORDER — HEPARIN SODIUM (PORCINE) 5000 UNIT/ML IJ SOLN
5000.0000 [IU] | Freq: Three times a day (TID) | INTRAMUSCULAR | Status: DC
  Administered 2018-08-16 – 2018-08-21 (×14): 5000 [IU] via SUBCUTANEOUS
  Filled 2018-08-16 (×14): qty 1

## 2018-08-16 MED ORDER — SODIUM CHLORIDE 0.9 % IV SOLN
500.0000 mg | Freq: Once | INTRAVENOUS | Status: AC
  Administered 2018-08-16: 500 mg via INTRAVENOUS
  Filled 2018-08-16: qty 500

## 2018-08-16 MED ORDER — SODIUM CHLORIDE 0.9 % IV SOLN
1.0000 g | INTRAVENOUS | Status: DC
  Administered 2018-08-17 – 2018-08-19 (×3): 1 g via INTRAVENOUS
  Filled 2018-08-16 (×3): qty 1

## 2018-08-16 MED ORDER — DICLOFENAC SODIUM 1 % TD GEL: 2.0000 g | Freq: Four times a day (QID) | TRANSDERMAL | Status: DC | PRN

## 2018-08-16 MED ORDER — ACETAMINOPHEN 500 MG PO TABS
500.0000 mg | ORAL_TABLET | Freq: Once | ORAL | Status: AC
  Administered 2018-08-16: 500 mg via ORAL
  Filled 2018-08-16: qty 1

## 2018-08-16 MED ORDER — TRAMADOL HCL 50 MG PO TABS
50.0000 mg | ORAL_TABLET | Freq: Two times a day (BID) | ORAL | Status: DC | PRN
  Administered 2018-08-17 – 2018-08-20 (×4): 50 mg via ORAL
  Filled 2018-08-16 (×4): qty 1

## 2018-08-16 MED ORDER — LEVOTHYROXINE SODIUM 88 MCG PO TABS
88.0000 ug | ORAL_TABLET | Freq: Every day | ORAL | Status: DC
  Administered 2018-08-17 – 2018-08-21 (×5): 88 ug via ORAL
  Filled 2018-08-16 (×5): qty 1

## 2018-08-16 MED ORDER — SODIUM CHLORIDE (PF) 0.9 % IJ SOLN
INTRAMUSCULAR | Status: AC
  Filled 2018-08-16: qty 50

## 2018-08-16 NOTE — ED Notes (Signed)
ED TO INPATIENT HANDOFF REPORT  Name/Age/Gender Deborah Shields 78 y.o. female  Code Status Code Status History    Date Active Date Inactive Code Status Order ID Comments User Context   07/15/2017 2230 07/18/2017 1650 Full Code [id number]  Rise Patience, MD ED   03/20/2017 1404 03/21/2017 1322 Full Code [id number]  Jovita Gamma, MD Inpatient    Advance Directive Documentation     Most Recent Value  Type of Advance Directive  Healthcare Power of [place], Living will  Pre-existing out of facility DNR order (yellow form or pink MOST form)  -  "MOST" Form in Place?  -      Home/SNF/Other Home  Chief Complaint Flu Like Symptoms   Level of Care/Admitting Diagnosis ED Disposition    ED Disposition Condition [place]: [place] [[id number]]  Level of Care: Stepdown [14]  Admit to SDU based on following criteria: Hemodynamic compromise or significant risk of instability:  Patient requiring short term acute titration and management of vasoactive drips, and invasive monitoring (i.e., CVP and Arterial line).  Diagnosis: Sepsis ([place]) [[id number]]  Admitting Physician: Elwyn Reach [[id number]]  Attending Physician: Elwyn Reach [[id number]]  Estimated length of stay: past midnight tomorrow  Certification:: I certify this patient will need inpatient services for at least 2 midnights  PT Class (Do Not Modify): Inpatient [101]  PT Acc Code (Do Not Modify): Private [1]       Medical History Past Medical History:  Diagnosis Date  . Anxiety   . Aortic insufficiency    mild due to degenerative changes  . Arthritis   . Basal cell carcinoma 2007   [place] [facility] Left leg  . Carotid artery occlusion   . Chronic kidney disease    CRD Stage 3  . Conjunctivitis due to adenovirus, both eyes   . Degenerative joint disease   . Diverticulosis of colon   . Dyspnea    with exertion  . Heart murmur   . Hyperlipidemia    NMR 07/2009: LDL 200  (2260/1233)TG 99, HDL 65. LDL goal =<120, ideally <100. father MI @ 62  . Hypertension   . Hypothyroidism    Dr Wilson Singer  . Microscopic hematuria   . Peripheral neuropathy    compressive in UE bilaterally; Dr Daylene Katayama  . Peripheral vascular disease ([place])    ICA bilat, Dr.Charles fields, [facility]  . Pneumonia    2017  . PONV (postoperative nausea and vomiting)    "Inner ear, does  okay with Scopolamine"  . Rectocele   . [place] spotted fever     Allergies Allergies  Allergen Reactions  . Aspirin Nausea Only and Other (See Comments)    REACTION: GI upset  ( pt can take 81 mg but NOT   325 mg ASA )  . Lovastatin Nausea Only and Other (See Comments)    REACTION: nausea  . Benazepril Hcl Other (See Comments)    Unknown  . Paroxetine Other (See Comments)    Unknown  . Bupropion Hcl Other (See Comments)    REACTION: tinnitis  . Ezetimibe Nausea And Vomiting and Other (See Comments)    REACTION: GI symptoms  . Fenofibrate Other (See Comments)    Myalgia   . Pravastatin Sodium Other (See Comments)    REACTION: elevated CPK - Muscle's in bilateral Leg    IV Location/Drains/Wounds Patient Lines/Drains/Airways Status   Active Line/Drains/Airways    Name:   Placement date:  Placement time:   Site:   Days:   Peripheral IV 08/16/18 Left Antecubital   08/16/18    1801    Antecubital   less than 1   Peripheral IV 08/16/18 Left;Distal Forearm   08/16/18    1826    Forearm   less than 1   Incision (Closed) 03/20/17 Back Other (Comment)   03/20/17    1121     514          Labs/Imaging Results for orders placed or performed during the hospital encounter of 08/16/18 (from the past 48 hour(s))  Basic metabolic panel     Status: Abnormal   Collection Time: 08/16/18  5:18 PM  Result Value Ref Range   Sodium 136 135 - 145 mmol/L   Potassium 3.8 3.5 - 5.1 mmol/L   Chloride 99 98 - 111 mmol/L   CO2 26 22 - 32 mmol/L   Glucose, Bld 101 (H) 70 - 99 mg/dL   BUN 23 8 - 23 mg/dL    Creatinine, Ser 1.59 (H) 0.44 - 1.00 mg/dL   Calcium 9.1 8.9 - 10.3 mg/dL   GFR calc non Af Amer 31 (L) >60 mL/min   GFR calc Af Amer 35 (L) >60 mL/min   Anion gap 11 5 - 15    Comment: Performed at [facility], [place] [address] [place], [place] [address]  CBC     Status: Abnormal   Collection Time: 08/16/18  5:18 PM  Result Value Ref Range   WBC 20.6 (H) 4.0 - 10.5 K/uL   RBC 4.70 3.87 - 5.11 MIL/uL   Hemoglobin 13.6 12.0 - 15.0 g/dL   HCT 42.9 36.0 - 46.0 %   MCV 91.3 80.0 - 100.0 fL   MCH 28.9 26.0 - 34.0 pg   MCHC 31.7 30.0 - 36.0 g/dL   RDW 14.7 11.5 - 15.5 %   Platelets 291 150 - 400 K/uL   nRBC 0.0 0.0 - 0.2 %    Comment: Performed at [facility], [place] [address] [place], [place] [address]  POCT i-Stat troponin I     Status: Abnormal   Collection Time: 08/16/18  5:25 PM  Result Value Ref Range   Troponin i, poc 0.54 (HH) 0.00 - 0.08 ng/mL   Comment NOTIFIED PHYSICIAN    Comment 3            Comment: Due to the release kinetics of cTnI, a negative result within the first hours of the onset of symptoms does not rule out myocardial infarction with certainty. If myocardial infarction is still suspected, repeat the test at appropriate intervals.   Hepatic function panel     Status: Abnormal   Collection Time: 08/16/18  6:13 PM  Result Value Ref Range   Total Protein 7.7 6.5 - 8.1 g/dL   Albumin 3.7 3.5 - 5.0 g/dL   AST 36 15 - 41 U/L   ALT 20 0 - 44 U/L   Alkaline Phosphatase 142 (H) 38 - 126 U/L   Total Bilirubin 1.9 (H) 0.3 - 1.2 mg/dL   Bilirubin, Direct 0.4 (H) 0.0 - 0.2 mg/dL   Indirect Bilirubin 1.5 (H) 0.3 - 0.9 mg/dL    Comment: Performed at [facility], [place] [address] [place], [place] [address]  Lipase, blood     Status: None   Collection Time: 08/16/18  6:13 PM  Result Value Ref Range   Lipase 39 11 - 51 U/L  Comment: Performed at [facility], [place] [address]  [place], [place] [address]  Lactic acid, plasma     Status: Abnormal   Collection Time: 08/16/18  6:14 PM  Result Value Ref Range   Lactic Acid, Venous 3.1 (HH) 0.5 - 1.9 mmol/L    Comment: CRITICAL RESULT CALLED TO, READ BACK BY AND VERIFIED WITH: Karren Cobble RN 1907 08/16/2018 [place] Performed at [facility], [place] [address] [place], [place] [address]    Dg Chest 2 View  Result Date: 08/16/2018 CLINICAL DATA:  Chest pain and shortness of breath EXAM: CHEST - 2 VIEW COMPARISON:  07/15/2017 FINDINGS: Hyperexpansion is consistent with emphysema. Patchy areas of airspace disease are seen in the right upper and mid lung. Left lung clear. The cardiopericardial silhouette is within normal limits for size. The visualized bony structures of the thorax are intact. IMPRESSION: Patchy airspace opacity in the right lung suggests pneumonia. Follow-up imaging recommended to ensure resolution. Electronically Signed   By: Misty Stanley M.D.   On: 08/16/2018 16:59   Ct Angio Chest Pe W And/or Wo Contrast  Result Date: 08/16/2018 CLINICAL DATA:  Shortness of breath and chest pain. EXAM: CT ANGIOGRAPHY CHEST WITH CONTRAST TECHNIQUE: Multidetector CT imaging of the chest was performed using the standard protocol during bolus administration of intravenous contrast. Multiplanar CT image reconstructions and MIPs were obtained to evaluate the vascular anatomy. CONTRAST:  136mL ISOVUE-370 IOPAMIDOL (ISOVUE-370) INJECTION 76% COMPARISON:  07/15/2017 FINDINGS: Cardiovascular: The heart size is normal. No substantial pericardial effusion. Atherosclerotic calcification is noted in the wall of the thoracic aorta. No filling defect in the opacified pulmonary arteries to suggest the presence of an acute pulmonary embolus. Mediastinum/Nodes: No mediastinal lymphadenopathy. 10 mm short axis right hilar lymph node is upper normal for size. No left hilar lymphadenopathy. The esophagus has normal imaging features. There is  no axillary lymphadenopathy. Lungs/Pleura: The central tracheobronchial airways are patent. Centrilobular emphsyema noted. Biapical pleuroparenchymal scarring is similar to prior. Areas of architectural distortion and scarring are again noted in the lungs bilaterally. Interstitial and airspace disease in the anterior right upper lobe (39/10) is new in the interval. Right middle lobe volume loss and scarring is not substantially changed since prior. Interstitial and patchy airspace disease in the peripheral right lower lobe (84/10) is new since prior. 11 mm confluent nodular focus is identified within this airspace disease. Scarring in the anterior right lower lobe (107/10) is stable. Clustered areas of tree-in-bud nodularity in the left lung are similar and compatible with sequelae of atypical infection. No substantial pleural effusion. Upper Abdomen: Probable cyst upper pole left kidney. Musculoskeletal: No worrisome lytic or sclerotic osseous abnormality. Review of the MIP images confirms the above findings. IMPRESSION: 1. No CT evidence for acute pulmonary embolus. 2. Interval development of patchy airspace disease in the anterior right upper lobe and peripheral right lower lobe. Imaging features suggest multifocal pneumonia. Follow-up imaging recommended to ensure resolution. 3. Stable appearance of scattered areas of architectural distortion and scarring in the lungs bilaterally. 4.  Emphysema. (ICD10-J43.9) 5.  Aortic Atherosclerois (ICD10-170.0) Electronically Signed   By: Misty Stanley M.D.   On: 08/16/2018 20:09    Pending Labs Unresulted Labs (From admission, onward)    Start     Ordered   08/16/18 1814  Lactic acid, plasma  Now then every 2 hours,   STAT     08/16/18 1813   08/16/18 1813  Urinalysis, Routine w reflex microscopic  Once,  R     08/16/18 1812   08/16/18 1813  Urine culture  ONCE - STAT,   STAT     08/16/18 1812   08/16/18 1813  Blood culture (routine x 2)  BLOOD CULTURE X 2,    STAT     08/16/18 1812   Signed and Held  Culture, blood (routine x 2) Call MD if unable to obtain prior to antibiotics being given  BLOOD CULTURE X 2,   R    Comments:  If blood cultures drawn in Emergency Department - Do not draw and cancel order    Signed and Held   Signed and Held  Culture, sputum-assessment  Once,   R     Signed and Held   Signed and Held  Gram stain  Once,   R     Signed and Held   Signed and Held  HIV antibody (Routine Screening)  Once,   R     Signed and Held   Signed and Held  Strep pneumoniae urinary antigen  Once,   R     Signed and Held   Signed and Held  Comprehensive metabolic panel  Tomorrow morning,   R     Signed and Held   Signed and Held  CBC  (heparin)  Once,   R    Comments:  Baseline for heparin therapy IF NOT ALREADY DRAWN.  Notify MD if PLT < 100 K.    Signed and Held   Signed and Held  Creatinine, serum  (heparin)  Once,   R    Comments:  Baseline for heparin therapy IF NOT ALREADY DRAWN.    Signed and Held   Signed and Held  Comprehensive metabolic panel  Tomorrow morning,   R     Signed and Held   Signed and Held  CBC  Tomorrow morning,   R     Signed and Held          Vitals/Pain Today's Vitals   08/16/18 1830 08/16/18 1900 08/16/18 1930 08/16/18 2000  BP: (!) 130/105 117/84 117/71 132/78  Pulse: (!) 109 100 98 79  Resp: 12 15 20 19   Temp:      TempSrc:      SpO2: 97% 97% 98% 98%  Weight:      Height:      PainSc:        Isolation Precautions No active isolations  Medications Medications  azithromycin (ZITHROMAX) 500 mg in sodium chloride 0.9 % 250 mL IVPB (500 mg Intravenous New Bag/Given 08/16/18 2008)  sodium chloride (PF) 0.9 % injection (has no administration in time range)  iopamidol (ISOVUE-370) 76 % injection (has no administration in time range)  cefTRIAXone (ROCEPHIN) 1 g in sodium chloride 0.9 % 100 mL IVPB (0 g Intravenous Stopped 08/16/18 2008)  acetaminophen (TYLENOL) tablet 500 mg (500 mg Oral Given  08/16/18 2004)  iopamidol (ISOVUE-370) 76 % injection 100 mL (100 mLs Intravenous Contrast Given 08/16/18 1946)    Mobility walks

## 2018-08-16 NOTE — ED Notes (Signed)
Dr. Sherry Ruffing notified of elevated troponin

## 2018-08-16 NOTE — ED Triage Notes (Signed)
Pt c/o chest pains, SOB, abd pains that started yesterday. Denies n/v/d but having some urinary incontinence and being treated for gout in bilat feet.  Sent from [place]. Hx PE and PNA.

## 2018-08-16 NOTE — ED Notes (Signed)
Patient transported to CT via stretcher.

## 2018-08-16 NOTE — ED Notes (Signed)
MD messaged about starting fluids.

## 2018-08-16 NOTE — ED Notes (Signed)
Patient given PO fluids and food per MD.

## 2018-08-16 NOTE — H&P (Signed)
History and Physical   [name] JIR:[id number] DOB: 02-14-1941 DOA: 08/16/2018  Referring MD/NP/PA: Dr. Sherry Ruffing  PCP: Lajean Manes, MD   Outpatient Specialists: None  Patient coming from: Home  Chief Complaint: Chest pain shortness of breath and abdominal pain  HPI: Deborah Shields is a 78 y.o. female with medical history significant of history of coronary artery disease, chronic kidney disease stage III, anxiety disorder, previous DVTs, hypothyroidism and peripheral vascular disease who was brought in with family complaining of chest pain shortness of breath and cough.  Chest pain is pleuritic in nature.  Associated with some tachycardia.  Also low-grade fever.  Patient was seen and evaluated and found to have what appears to be pneumonia on chest x-ray.  Subsequent CT angiogram of the chest revealed no PE.  She is septic with elevated lactic acid.  She has had productive cough.  Denied any hemoptysis.  Denied any nausea or vomiting.  She has generalized malaise.  Patient subsequently admitted with a diagnosis of sepsis due to pneumonia..  ED Course: Temperature is 102.1 with blood pressure 135/92 pulse 112 respiratory 22 oxygen sat 97% on 2 L.  White count is 20,000.  With normal hemoglobin and platelets.  Chemistry appears within normal except for creatinine of 1.59 which is at baseline.  Lactic acid of 3.1.  Chest x-ray showed patchy airspace opacity in the right lung suggesting pneumonia.  Subsequent CT angiogram of the chest also confirmed pneumonia.  Patient has received IV fluids and initial Rocephin and Zithromax for community-acquired pneumonia and is being admitted to the medical service.  Review of Systems: As per HPI otherwise 10 point review of systems negative.    Past Medical History:  Diagnosis Date  . Anxiety   . Aortic insufficiency    mild due to degenerative changes  . Arthritis   . Basal cell carcinoma 2007   [facility] Left leg  . Carotid artery  occlusion   . Chronic kidney disease    CRD Stage 3  . Conjunctivitis due to adenovirus, both eyes   . Degenerative joint disease   . Diverticulosis of colon   . Dyspnea    with exertion  . Heart murmur   . Hyperlipidemia    NMR 07/2009: LDL 200 (2260/1233)TG 99, HDL 65. LDL goal =<120, ideally <100. father MI @ 3  . Hypertension   . Hypothyroidism    Dr Wilson Singer  . Microscopic hematuria   . Peripheral neuropathy    compressive in UE bilaterally; Dr Daylene Katayama  . Peripheral vascular disease ([place])    ICA bilat, Dr.Charles fields, [facility]  . Pneumonia    2017  . PONV (postoperative nausea and vomiting)    "Inner ear, does  okay with Scopolamine"  . Rectocele   . [place] spotted fever     Past Surgical History:  Procedure Laterality Date  . ABDOMINAL HYSTERECTOMY  1973   age 83 due to dysfuctional menses; HRT x 25-30 years  . APPENDECTOMY  1952  . basal cell cancer  03/2006   leg  . BILATERAL OOPHORECTOMY  1990   prophylactically (sister had ovarian ca)  . CARPAL TUNNEL RELEASE Bilateral 1989   right  . CATARACT EXTRACTION, BILATERAL  2010   Dr Kathrin Penner  . CHOLECYSTECTOMY  2006  . COLONOSCOPY     Dr Ardis Hughs  . EYE SURGERY Left    Bilateral eye (film removed lt eye 01/09/17)  . LUMBAR LAMINECTOMY/DECOMPRESSION MICRODISCECTOMY Left 03/20/2017   Procedure: LEFT  LUMBAR FOURLUMBAR FIVE LAMINOTOMY AND MICROICRODISCECTOMY 1 LEVEL;  Surgeon: Jovita Gamma, MD;  Location: [place];  Service: Neurosurgery;  Laterality: Left;  LEFT  . NM MYOVIEW LTD  06/13/2008   low risk scan  . ORTHOPEDIC SURGERY  1989/200/2002/2012   elbows,shoulder surgery x 3, right hand  . RECTOCELE REPAIR  2016  . TONSILLECTOMY  1957  . US ECHOCARDIOGRAPHY  05/01/2010   trace MR,AI,TR;EF =>55%  . VIDEO BRONCHOSCOPY Bilateral 07/17/2017   Procedure: VIDEO BRONCHOSCOPY WITHOUT FLUORO;  Surgeon: Rigoberto Noel, MD;  Location: [facility];  Service: Cardiopulmonary;  Laterality: Bilateral;  . VIDEO  BRONCHOSCOPY Bilateral 09/02/2017   Procedure: VIDEO BRONCHOSCOPY WITHOUT FLUORO;  Surgeon: Rigoberto Noel, MD;  Location: [facility];  Service: Cardiopulmonary;  Laterality: Bilateral;     reports that she has never smoked. She has never used smokeless tobacco. She reports that she does not drink alcohol or use drugs.  Allergies  Allergen Reactions  . Aspirin Nausea Only and Other (See Comments)    REACTION: GI upset  ( pt can take 81 mg but NOT   325 mg ASA )  . Lovastatin Nausea Only and Other (See Comments)    REACTION: nausea  . Benazepril Hcl Other (See Comments)    Unknown  . Paroxetine Other (See Comments)    Unknown  . Bupropion Hcl Other (See Comments)    REACTION: tinnitis  . Ezetimibe Nausea And Vomiting and Other (See Comments)    REACTION: GI symptoms  . Fenofibrate Other (See Comments)    Myalgia   . Pravastatin Sodium Other (See Comments)    REACTION: elevated CPK - Muscle's in bilateral Leg    Family History  Problem Relation Age of Onset  . Heart attack Father 21  . Heart disease Father        Before age 68  . Colon cancer Mother 60  . Stroke Mother 57  . Kidney disease Mother        ? renal calculi; S/P resecton of kidney  . Cancer - Colon Mother 48  . Ovarian cancer Sister   . Other Sister        Valve replacement (aortic & mitral ) in 2 sisters  . Heart disease Sister        Before age 58  . Heart disease Brother        aortic & mitral valve replacement in 2 bro; both had CBAG  . Diabetes Neg Hx   . Breast cancer Neg Hx      Prior to Admission medications   Medication Sig Start Date End Date Taking? Authorizing Provider  acetaminophen (TYLENOL) 325 MG tablet Take 325 mg by mouth every 6 (six) hours as needed for fever or headache.   Yes [provider]  b complex vitamins tablet Take 1 tablet by mouth daily after supper.    Yes [provider]  cyclobenzaprine (FLEXERIL) 10 MG tablet Take 10 mg by mouth 2 (two) times daily  as needed for muscle spasms (scheduled every every).    Yes [provider]  diclofenac sodium (VOLTAREN) 1 % GEL Apply 2-4 g topically 4 (four) times daily as needed (for pain).    Yes [provider]  FIBER PO Take 1 tablet by mouth daily.   Yes [provider]  levothyroxine (SYNTHROID) 88 MCG tablet Take 88 mcg by mouth daily before breakfast. Taking the brand "synthroid"   Yes [provider]  LORazepam (ATIVAN) 0.5 MG tablet  Take 1 tablet (0.5 mg total) by mouth 3 (three) times daily as needed. Patient taking differently: Take 0.5 mg by mouth at bedtime.  10/17/15  Yes Burns, Claudina Lick, MD  meclizine (ANTIVERT) 25 MG tablet Take 1 tablet (25 mg total) by mouth daily. 1/2 every 8 hrs prn for imbalance Patient taking differently: Take 12.5-25 mg by mouth at bedtime.  11/01/10  Yes Hendricks Limes, MD  metoprolol succinate (TOPROL-XL) 50 MG 24 hr tablet Take 25 mg by mouth 2 (two) times daily after a meal. After breakfast & after supper 05/27/17  Yes [provider]  Multiple Vitamins-Minerals (CENTRUM SILVER PO) Take 1 tablet by mouth daily after supper.    Yes [provider]  Probiotic Product (PROBIOTIC ADVANCED PO) Take 1 capsule by mouth daily after breakfast. VSL #3   Yes [provider]  senna (SENOKOT) 8.6 MG tablet Take 1 tablet by mouth daily.   Yes [provider]  traMADol (ULTRAM) 50 MG tablet 1/2-1 by mouth once daily as needed. Patient taking differently: Take 50 mg by mouth 2 (two) times daily as needed for moderate pain or severe pain (1 tablet by mouth scheduled each bedtime.).  10/17/15  Yes Binnie Rail, MD    Physical Exam: Vitals:   08/16/18 1830 08/16/18 1900 08/16/18 1930 08/16/18 2000  BP: (!) 130/105 117/84 117/71 132/78  Pulse: (!) 109 100 98 79  Resp: 12 15 20 19   Temp:      TempSrc:      SpO2: 97% 97% 98% 98%  Weight:      Height:          Constitutional: NAD, calm, comfortable,  frail Vitals:   08/16/18 1830 08/16/18 1900 08/16/18 1930 08/16/18 2000  BP: (!) 130/105 117/84 117/71 132/78  Pulse: (!) 109 100 98 79  Resp: 12 15 20 19   Temp:      TempSrc:      SpO2: 97% 97% 98% 98%  Weight:      Height:       Eyes: PERRL, lids and conjunctivae normal ENMT: Mucous membranes are moist. Posterior pharynx clear of any exudate or lesions.Normal dentition.  Neck: normal, supple, no masses, no thyromegaly Respiratory: Decreased air entry at the bases with coarse breath sounds, no wheezing, no crackles. Normal respiratory effort. No accessory muscle use.  Cardiovascular: Regular rate and rhythm, no murmurs / rubs / gallops. No extremity edema. 2+ pedal pulses. No carotid bruits.  Abdomen: no tenderness, no masses palpated. No hepatosplenomegaly. Bowel sounds positive.  Musculoskeletal: no clubbing / cyanosis. No joint deformity upper and lower extremities. Good ROM, no contractures. Normal muscle tone.  Skin: no rashes, lesions, ulcers. No induration Neurologic: CN 2-12 grossly intact. Sensation intact, DTR normal. Strength 5/5 in all 4.  Psychiatric: Normal judgment and insight. Alert and oriented x 3. Normal mood.     Labs on Admission: I have personally reviewed following labs and imaging studies  CBC: Recent Labs  Lab 08/16/18 1718  WBC 20.6*  HGB 13.6  HCT 42.9  MCV 91.3  PLT 166   Basic Metabolic Panel: Recent Labs  Lab 08/16/18 1718  NA 136  K 3.8  CL 99  CO2 26  GLUCOSE 101*  BUN 23  CREATININE 1.59*  CALCIUM 9.1   GFR: Estimated Creatinine Clearance: 22.9 mL/min (A) (by C-G formula based on SCr of 1.59 mg/dL (H)). Liver Function Tests: Recent Labs  Lab 08/16/18 1813  AST 36  ALT 20  ALKPHOS 142*  BILITOT 1.9*  PROT 7.7  ALBUMIN 3.7   Recent Labs  Lab 08/16/18 1813  LIPASE 39   No results for input(s): AMMONIA in the last 168 hours. Coagulation Profile: No results for input(s): INR, PROTIME in the last 168 hours. Cardiac  Enzymes: No results for input(s): CKTOTAL, CKMB, CKMBINDEX, TROPONINI in the last 168 hours. BNP (last 3 results) No results for input(s): PROBNP in the last 8760 hours. HbA1C: No results for input(s): HGBA1C in the last 72 hours. CBG: No results for input(s): GLUCAP in the last 168 hours. Lipid Profile: No results for input(s): CHOL, HDL, LDLCALC, TRIG, CHOLHDL, LDLDIRECT in the last 72 hours. Thyroid Function Tests: No results for input(s): TSH, T4TOTAL, FREET4, T3FREE, THYROIDAB in the last 72 hours. Anemia Panel: No results for input(s): VITAMINB12, FOLATE, FERRITIN, TIBC, IRON, RETICCTPCT in the last 72 hours. Urine analysis:    Component Value Date/Time   COLORURINE YELLOW 11/21/2015 [place] 11/21/2015 1156   LABSPEC <=1.005 (A) 11/21/2015 1156   PHURINE 6.5 11/21/2015 1156   GLUCOSEU NEGATIVE 11/21/2015 1156   HGBUR NEGATIVE 11/21/2015 [place] 11/21/2015 1156   BILIRUBINUR Neg 09/07/2012 1426   KETONESUR NEGATIVE 11/21/2015 1156   PROTEINUR NEGATIVE 06/02/2014 1515   UROBILINOGEN 0.2 11/21/2015 1156   NITRITE NEGATIVE 11/21/2015 1156   LEUKOCYTESUR NEGATIVE 11/21/2015 1156   Sepsis Labs: @LABRCNTIP (procalcitonin:4,lacticidven:4) )No results found for this or any previous visit (from the past 240 hour(s)).   Radiological Exams on Admission: Dg Chest 2 View  Result Date: 08/16/2018 CLINICAL DATA:  Chest pain and shortness of breath EXAM: CHEST - 2 VIEW COMPARISON:  07/15/2017 FINDINGS: Hyperexpansion is consistent with emphysema. Patchy areas of airspace disease are seen in the right upper and mid lung. Left lung clear. The cardiopericardial silhouette is within normal limits for size. The visualized bony structures of the thorax are intact. IMPRESSION: Patchy airspace opacity in the right lung suggests pneumonia. Follow-up imaging recommended to ensure resolution. Electronically Signed   By: Misty Stanley M.D.   On: 08/16/2018 16:59    Ct Angio Chest Pe W And/or Wo Contrast  Result Date: 08/16/2018 CLINICAL DATA:  Shortness of breath and chest pain. EXAM: CT ANGIOGRAPHY CHEST WITH CONTRAST TECHNIQUE: Multidetector CT imaging of the chest was performed using the standard protocol during bolus administration of intravenous contrast. Multiplanar CT image reconstructions and MIPs were obtained to evaluate the vascular anatomy. CONTRAST:  174mL ISOVUE-370 IOPAMIDOL (ISOVUE-370) INJECTION 76% COMPARISON:  07/15/2017 FINDINGS: Cardiovascular: The heart size is normal. No substantial pericardial effusion. Atherosclerotic calcification is noted in the wall of the thoracic aorta. No filling defect in the opacified pulmonary arteries to suggest the presence of an acute pulmonary embolus. Mediastinum/Nodes: No mediastinal lymphadenopathy. 10 mm short axis right hilar lymph node is upper normal for size. No left hilar lymphadenopathy. The esophagus has normal imaging features. There is no axillary lymphadenopathy. Lungs/Pleura: The central tracheobronchial airways are patent. Centrilobular emphsyema noted. Biapical pleuroparenchymal scarring is similar to prior. Areas of architectural distortion and scarring are again noted in the lungs bilaterally. Interstitial and airspace disease in the anterior right upper lobe (39/10) is new in the interval. Right middle lobe volume loss and scarring is not substantially changed since prior. Interstitial and patchy airspace disease in the peripheral right lower lobe (84/10) is new since prior. 11 mm confluent nodular focus is identified within this airspace disease. Scarring in the anterior right lower lobe (107/10) is stable. Clustered areas  of tree-in-bud nodularity in the left lung are similar and compatible with sequelae of atypical infection. No substantial pleural effusion. Upper Abdomen: Probable cyst upper pole left kidney. Musculoskeletal: No worrisome lytic or sclerotic osseous abnormality. Review of the  MIP images confirms the above findings. IMPRESSION: 1. No CT evidence for acute pulmonary embolus. 2. Interval development of patchy airspace disease in the anterior right upper lobe and peripheral right lower lobe. Imaging features suggest multifocal pneumonia. Follow-up imaging recommended to ensure resolution. 3. Stable appearance of scattered areas of architectural distortion and scarring in the lungs bilaterally. 4.  Emphysema. (ICD10-J43.9) 5.  Aortic Atherosclerois (ICD10-170.0) Electronically Signed   By: Misty Stanley M.D.   On: 08/16/2018 20:09    EKG: Independently reviewed.  Sinus tachycardia with a rate of 116.  No significant ST changes.  Assessment/Plan Principal Problem:   Sepsis ([place]) Active Problems:   Hypothyroidism   HYPERLIPIDEMIA   Essential hypertension   Chronic renal insufficiency, stage III (moderate) (HCC)   PAF (paroxysmal atrial fibrillation) ([place])   Community acquired pneumonia     #1 sepsis: Secondary to pneumonia.  Patient will be treated for community-acquired pneumonia.  Aggressively hydrate and continue antibiotics.  Continue supportive care.  #2 pneumonia: Again presumed community-acquired pneumonia.  Continue IV Rocephin and Zithromax.  #3 hypothyroidism: Continue levothyroxine.   #4 paroxysmal atrial fibrillation: Currently in sinus rhythm.  Patient has mildly elevated troponin.  Will be related to have chronic kidney disease but also the sinus tachycardia.  Cycle enzymes and monitor.  #5 hypertension: Resume home blood pressure medications as soon as patient is stable.    DVT prophylaxis: Heparin Code Status: Full code Family Communication: Family at bedside Disposition Plan: To be determined Consults called: None Admission status: Inpatient  Severity of Illness: The appropriate patient status for this patient is INPATIENT. Inpatient status is judged to be reasonable and necessary in order to provide the required intensity of service to  ensure the patient's safety. The patient's presenting symptoms, physical exam findings, and initial radiographic and laboratory data in the context of their chronic comorbidities is felt to place them at high risk for further clinical deterioration. Furthermore, it is not anticipated that the patient will be medically stable for discharge from the hospital within 2 midnights of admission. The following factors support the patient status of inpatient.   " The patient's presenting symptoms include shortness of breath and chest pain. " The worrisome physical exam findings include crackles in the lung base. " The initial radiographic and laboratory data are worrisome because of evidence of pneumonia on x-ray and CT. " The chronic co-morbidities include hypothyroidism and peripheral vascular disease.   * I certify that at the point of admission it is my clinical judgment that the patient will require inpatient hospital care spanning beyond 2 midnights from the point of admission due to high intensity of service, high risk for further deterioration and high frequency of surveillance required.Barbette Merino MD [facility] Pager [phone]  If 7PM-7AM, please contact night-coverage [email or web address] Password [facility]  08/16/2018, 8:48 PM

## 2018-08-16 NOTE — ED Provider Notes (Signed)
[facility] DEPT Provider Note   CSN: [id number] Arrival date & time: 08/16/18  1604    History   Chief Complaint Chief Complaint  Patient presents with  . Chest Pain  . Abdominal Pain    HPI Deborah Shields is a 78 y.o. female.     The history is provided by the patient and medical records. No language interpreter was used.  Shortness of Breath  Severity:  Severe Onset quality:  Gradual Duration:  2 days Timing:  Constant Progression:  Waxing and waning Chronicity:  New Context: URI   Relieved by:  Nothing Worsened by:  Nothing Ineffective treatments:  None tried Associated symptoms: chest pain, cough, fever and sputum production   Associated symptoms: no abdominal pain, no diaphoresis, no headaches, no hemoptysis, no neck pain, no sore throat, no vomiting and no wheezing   Risk factors: hx of PE/DVT     Past Medical History:  Diagnosis Date  . Anxiety   . Aortic insufficiency    mild due to degenerative changes  . Arthritis   . Basal cell carcinoma 2007   [place] [facility] Left leg  . Carotid artery occlusion   . Chronic kidney disease    CRD Stage 3  . Conjunctivitis due to adenovirus, both eyes   . Degenerative joint disease   . Diverticulosis of colon   . Dyspnea    with exertion  . Heart murmur   . Hyperlipidemia    NMR 07/2009: LDL 200 (2260/1233)TG 99, HDL 65. LDL goal =<120, ideally <100. father MI @ 49  . Hypertension   . Hypothyroidism    Dr Wilson Singer  . Microscopic hematuria   . Peripheral neuropathy    compressive in UE bilaterally; Dr Daylene Katayama  . Peripheral vascular disease ([place])    ICA bilat, Dr.Charles fields, [facility]  . Pneumonia    2017  . PONV (postoperative nausea and vomiting)    "Inner ear, does  okay with Scopolamine"  . Rectocele   . [place] spotted fever     Patient Active Problem List   Diagnosis Date Noted  . Malnutrition of moderate degree 07/16/2017  . Hemoptysis 07/15/2017  . HNP  (herniated nucleus pulposus), lumbar 03/20/2017  . Edema of both feet 12/10/2016  . Tick bite 11/21/2015  . Abnormal urine odor 11/21/2015  . Shortness of breath 11/21/2015  . Other fatigue 11/21/2015  . Anxiety 08/15/2015  . Encounter for pre-operative cardiovascular clearance 07/10/2015  . Pre-operative clearance 07/07/2015  . Rotator cuff tear 07/07/2015  . Rectocele, female 08/04/2014  . COPD with chronic bronchitis and emphysema ([place]) 02/14/2014  . Other malaise and fatigue 12/31/2013  . PAF (paroxysmal atrial fibrillation) ([place]) 01/10/2013  . Thrombosed external hemorrhoid 11/26/2012  . Chronic renal insufficiency, stage III (moderate) ([place]) 09/03/2011  . Atherosclerosis of native arteries of the extremities with intermittent claudication 04/04/2011  . Occlusion and stenosis of carotid artery without mention of cerebral infarction 04/04/2011  . PERIPHERAL NEUROPATHY 08/15/2008  . Hypothyroidism 08/12/2007  . HYPERLIPIDEMIA 08/12/2007  . Essential hypertension 08/12/2007  . Osteopenia 08/12/2007  . Carotid artery stenosis, asymptomatic 08/11/2007  . Osteoarthritis 08/11/2007  . BASAL CELL CARCINOMA, HX OF 08/11/2007  . FIBROCYSTIC BREAST DISEASE 11/18/2006  . DIVERTICULOSIS, COLON 08/10/2006    Past Surgical History:  Procedure Laterality Date  . ABDOMINAL HYSTERECTOMY  1973   age 74 due to dysfuctional menses; HRT x 25-30 years  . APPENDECTOMY  1952  . basal cell cancer  03/2006  leg  . BILATERAL OOPHORECTOMY  1990   prophylactically (sister had ovarian ca)  . CARPAL TUNNEL RELEASE Bilateral 1989   right  . CATARACT EXTRACTION, BILATERAL  2010   Dr Kathrin Penner  . CHOLECYSTECTOMY  2006  . COLONOSCOPY     Dr Ardis Hughs  . EYE SURGERY Left    Bilateral eye (film removed lt eye 01/09/17)  . LUMBAR LAMINECTOMY/DECOMPRESSION MICRODISCECTOMY Left 03/20/2017   Procedure: LEFT LUMBAR FOURLUMBAR FIVE LAMINOTOMY AND MICROICRODISCECTOMY 1 LEVEL;  Surgeon: Jovita Gamma, MD;   Location: [facility];  Service: Neurosurgery;  Laterality: Left;  LEFT  . NM MYOVIEW LTD  06/13/2008   low risk scan  . ORTHOPEDIC SURGERY  1989/200/2002/2012   elbows,shoulder surgery x 3, right hand  . RECTOCELE REPAIR  2016  . TONSILLECTOMY  1957  . US ECHOCARDIOGRAPHY  05/01/2010   trace MR,AI,TR;EF =>55%  . VIDEO BRONCHOSCOPY Bilateral 07/17/2017   Procedure: VIDEO BRONCHOSCOPY WITHOUT FLUORO;  Surgeon: Rigoberto Noel, MD;  Location: [facility];  Service: Cardiopulmonary;  Laterality: Bilateral;  . VIDEO BRONCHOSCOPY Bilateral 09/02/2017   Procedure: VIDEO BRONCHOSCOPY WITHOUT FLUORO;  Surgeon: Rigoberto Noel, MD;  Location: [facility];  Service: Cardiopulmonary;  Laterality: Bilateral;     OB History    Gravida  3   Para      Term      Preterm      AB  2   Living  1     SAB  2   TAB      Ectopic      Multiple      Live Births               Home Medications    Prior to Admission medications   Medication Sig Start Date End Date Taking? Authorizing Provider  b complex vitamins tablet Take 1 tablet by mouth daily after supper.     [provider]  cyclobenzaprine (FLEXERIL) 10 MG tablet Take 10 mg by mouth 2 (two) times daily as needed for muscle spasms (scheduled every every).     [provider]  diclofenac sodium (VOLTAREN) 1 % GEL Apply 2-4 g topically 4 (four) times daily as needed (for pain).     [provider]  FIBER PO Take 1 tablet by mouth daily.    [provider]  levothyroxine (SYNTHROID) 88 MCG tablet Take 88 mcg by mouth daily before breakfast. Taking the brand "synthroid"    [provider]  LORazepam (ATIVAN) 0.5 MG tablet Take 1 tablet (0.5 mg total) by mouth 3 (three) times daily as needed. Patient taking differently: Take 0.5 mg by mouth at bedtime.  10/17/15   Binnie Rail, MD  meclizine (ANTIVERT) 25 MG tablet Take 1 tablet (25 mg total) by mouth daily. 1/2 every 8 hrs prn for imbalance Patient  taking differently: Take 12.5-25 mg by mouth at bedtime.  11/01/10   Hendricks Limes, MD  metoprolol succinate (TOPROL-XL) 50 MG 24 hr tablet Take 25 mg by mouth 2 (two) times daily after a meal. After breakfast & after supper 05/27/17   [provider]  Multiple Vitamins-Minerals (CENTRUM SILVER PO) Take 1 tablet by mouth daily after supper.     [provider]  Probiotic Product (PROBIOTIC ADVANCED PO) Take 1 capsule by mouth daily after breakfast. VSL #3    [provider]  psyllium (REGULOID) 0.52 g capsule Take 0.52 g by mouth daily. FIBER    [provider]  traMADol (  ULTRAM) 50 MG tablet 1/2-1 by mouth once daily as needed. Patient taking differently: Take 50 mg by mouth 2 (two) times daily as needed for moderate pain or severe pain (1 tablet by mouth scheduled each bedtime.).  10/17/15   Binnie Rail, MD    Family History Family History  Problem Relation Age of Onset  . Heart attack Father 27  . Heart disease Father        Before age 4  . Colon cancer Mother 63  . Stroke Mother 72  . Kidney disease Mother        ? renal calculi; S/P resecton of kidney  . Cancer - Colon Mother 76  . Ovarian cancer Sister   . Other Sister        Valve replacement (aortic & mitral ) in 2 sisters  . Heart disease Sister        Before age 64  . Heart disease Brother        aortic & mitral valve replacement in 2 bro; both had CBAG  . Diabetes Neg Hx   . Breast cancer Neg Hx     Social History Social History   Tobacco Use  . Smoking status: Never Smoker  . Smokeless tobacco: Never Used  Substance Use Topics  . Alcohol use: No    Alcohol/week: 0.0 standard drinks  . Drug use: No     Allergies   Aspirin; Lovastatin; Benazepril hcl; Paroxetine; Bupropion hcl; Ezetimibe; Fenofibrate; and Pravastatin sodium   Review of Systems Review of Systems  Constitutional: Positive for chills, fatigue and fever. Negative for diaphoresis.  HENT: Positive for  congestion. Negative for sore throat.   Eyes: Negative for visual disturbance.  Respiratory: Positive for cough, sputum production, chest tightness and shortness of breath. Negative for hemoptysis, wheezing and stridor.   Cardiovascular: Positive for chest pain.  Gastrointestinal: Negative for abdominal pain, constipation, diarrhea, nausea and vomiting.  Genitourinary: Negative for dysuria.  Musculoskeletal: Negative for back pain, neck pain and neck stiffness.  Neurological: Negative for light-headedness and headaches.  Psychiatric/Behavioral: Negative for agitation and confusion.  All other systems reviewed and are negative.    Physical Exam Updated Vital Signs BP (!) 135/92 (BP Location: Right Arm)   Pulse (!) 112   Temp 99 F (37.2 C) (Oral)   Resp (!) 22   Ht 5\' 7"  (1.702 m)   Wt 50.6 kg   SpO2 98%   BMI 17.46 kg/m   Physical Exam Vitals signs and nursing note reviewed.  Constitutional:      General: She is not in acute distress.    Appearance: She is well-developed. She is not ill-appearing, toxic-appearing or diaphoretic.  HENT:     Head: Normocephalic and atraumatic.  Eyes:     Conjunctiva/sclera: Conjunctivae normal.     Pupils: Pupils are equal, round, and reactive to light.  Neck:     Musculoskeletal: Neck supple.  Cardiovascular:     Rate and Rhythm: Normal rate and regular rhythm.     Heart sounds: No murmur.  Pulmonary:     Effort: Pulmonary effort is normal. Tachypnea present. No respiratory distress.     Breath sounds: Rhonchi present. No decreased breath sounds, wheezing or rales.  Abdominal:     Palpations: Abdomen is soft.     Tenderness: There is no abdominal tenderness.  Musculoskeletal:     Right lower leg: She exhibits no tenderness.     Left lower leg: She exhibits no tenderness.  Skin:  General: Skin is warm and dry.     Capillary Refill: Capillary refill takes less than 2 seconds.  Neurological:     General: No focal deficit present.       Mental Status: She is alert.  Psychiatric:        Mood and Affect: Mood normal.      ED Treatments / Results  Labs (all labs ordered are listed, but only abnormal results are displayed) Labs Reviewed  BASIC METABOLIC PANEL - Abnormal; Notable for the following components:      Result Value   Glucose, Bld 101 (*)    Creatinine, Ser 1.59 (*)    GFR calc non Af Amer 31 (*)    GFR calc Af Amer 35 (*)    All other components within normal limits  CBC - Abnormal; Notable for the following components:   WBC 20.6 (*)    All other components within normal limits  HEPATIC FUNCTION PANEL - Abnormal; Notable for the following components:   Alkaline Phosphatase 142 (*)    Total Bilirubin 1.9 (*)    Bilirubin, Direct 0.4 (*)    Indirect Bilirubin 1.5 (*)    All other components within normal limits  LACTIC ACID, PLASMA - Abnormal; Notable for the following components:   Lactic Acid, Venous 3.1 (*)    All other components within normal limits  LACTIC ACID, PLASMA - Abnormal; Notable for the following components:   Lactic Acid, Venous 3.2 (*)    All other components within normal limits  POCT I-STAT TROPONIN I - Abnormal; Notable for the following components:   Troponin i, poc 0.54 (*)    All other components within normal limits  URINE CULTURE  CULTURE, BLOOD (ROUTINE X 2)  CULTURE, BLOOD (ROUTINE X 2)  EXPECTORATED SPUTUM ASSESSMENT W REFEX TO RESP CULTURE  GRAM STAIN  RESPIRATORY PANEL BY PCR  LIPASE, BLOOD  URINALYSIS, ROUTINE W REFLEX MICROSCOPIC  HIV ANTIBODY (ROUTINE TESTING W REFLEX)  STREP PNEUMONIAE URINARY ANTIGEN  COMPREHENSIVE METABOLIC PANEL  CBC  I-STAT TROPONIN, ED    EKG EKG Interpretation  Date/Time:  Sunday August 16 2018 16:17:50 EST Ventricular Rate:  114 PR Interval:    QRS Duration: 86 QT Interval:  347 QTC Calculation: 472 R Axis:   48 Text Interpretation:  Sinus tachycardia Atrial premature complexes Biatrial enlargement Minimal ST  depression, anterolateral leads When compared to prior, faster rate.;  No STEMI Confirmed by Antony Blackbird [phone]) on 08/16/2018 5:54:39 PM   Radiology Dg Chest 2 View  Result Date: 08/16/2018 CLINICAL DATA:  Chest pain and shortness of breath EXAM: CHEST - 2 VIEW COMPARISON:  07/15/2017 FINDINGS: Hyperexpansion is consistent with emphysema. Patchy areas of airspace disease are seen in the right upper and mid lung. Left lung clear. The cardiopericardial silhouette is within normal limits for size. The visualized bony structures of the thorax are intact. IMPRESSION: Patchy airspace opacity in the right lung suggests pneumonia. Follow-up imaging recommended to ensure resolution. Electronically Signed   By: Misty Stanley M.D.   On: 08/16/2018 16:59   Ct Angio Chest Pe W And/or Wo Contrast  Result Date: 08/16/2018 CLINICAL DATA:  Shortness of breath and chest pain. EXAM: CT ANGIOGRAPHY CHEST WITH CONTRAST TECHNIQUE: Multidetector CT imaging of the chest was performed using the standard protocol during bolus administration of intravenous contrast. Multiplanar CT image reconstructions and MIPs were obtained to evaluate the vascular anatomy. CONTRAST:  17mL ISOVUE-370 IOPAMIDOL (ISOVUE-370) INJECTION 76% COMPARISON:  07/15/2017 FINDINGS: Cardiovascular: The  heart size is normal. No substantial pericardial effusion. Atherosclerotic calcification is noted in the wall of the thoracic aorta. No filling defect in the opacified pulmonary arteries to suggest the presence of an acute pulmonary embolus. Mediastinum/Nodes: No mediastinal lymphadenopathy. 10 mm short axis right hilar lymph node is upper normal for size. No left hilar lymphadenopathy. The esophagus has normal imaging features. There is no axillary lymphadenopathy. Lungs/Pleura: The central tracheobronchial airways are patent. Centrilobular emphsyema noted. Biapical pleuroparenchymal scarring is similar to prior. Areas of architectural distortion and  scarring are again noted in the lungs bilaterally. Interstitial and airspace disease in the anterior right upper lobe (39/10) is new in the interval. Right middle lobe volume loss and scarring is not substantially changed since prior. Interstitial and patchy airspace disease in the peripheral right lower lobe (84/10) is new since prior. 11 mm confluent nodular focus is identified within this airspace disease. Scarring in the anterior right lower lobe (107/10) is stable. Clustered areas of tree-in-bud nodularity in the left lung are similar and compatible with sequelae of atypical infection. No substantial pleural effusion. Upper Abdomen: Probable cyst upper pole left kidney. Musculoskeletal: No worrisome lytic or sclerotic osseous abnormality. Review of the MIP images confirms the above findings. IMPRESSION: 1. No CT evidence for acute pulmonary embolus. 2. Interval development of patchy airspace disease in the anterior right upper lobe and peripheral right lower lobe. Imaging features suggest multifocal pneumonia. Follow-up imaging recommended to ensure resolution. 3. Stable appearance of scattered areas of architectural distortion and scarring in the lungs bilaterally. 4.  Emphysema. (ICD10-J43.9) 5.  Aortic Atherosclerois (ICD10-170.0) Electronically Signed   By: Misty Stanley M.D.   On: 08/16/2018 20:09    Procedures Procedures (including critical care time)  CRITICAL CARE Performed by: Gwenyth Allegra  Total critical care time: 35 minutes Critical care time was exclusive of separately billable procedures and treating other patients. Critical care was necessary to treat or prevent imminent or life-threatening deterioration. Critical care was time spent personally by me on the following activities: development of treatment plan with patient and/or surrogate as well as nursing, discussions with consultants, evaluation of patient's response to treatment, examination of patient, obtaining history  from patient or surrogate, ordering and performing treatments and interventions, ordering and review of laboratory studies, ordering and review of radiographic studies, pulse oximetry and re-evaluation of patient's condition.   Medications Ordered in ED Medications  sodium chloride (PF) 0.9 % injection (has no administration in time range)  iopamidol (ISOVUE-370) 76 % injection (has no administration in time range)  B-complex with vitamin C tablet 1 tablet (has no administration in time range)  multivitamin with minerals tablet (has no administration in time range)  meclizine (ANTIVERT) tablet 12.5-25 mg (25 mg Oral Given 08/16/18 2248)  levothyroxine (SYNTHROID, LEVOTHROID) tablet 88 mcg (has no administration in time range)  traMADol (ULTRAM) tablet 50 mg (has no administration in time range)  LORazepam (ATIVAN) tablet 0.5 mg (0.5 mg Oral Given 08/16/18 2248)  diclofenac sodium (VOLTAREN) 1 % transdermal gel 2-4 g (has no administration in time range)  cyclobenzaprine (FLEXERIL) tablet 10 mg (has no administration in time range)  metoprolol succinate (TOPROL-XL) 24 hr tablet 25 mg (has no administration in time range)  polycarbophil (FIBERCON) tablet ( Oral Given 08/16/18 2248)  acidophilus (RISAQUAD) capsule (has no administration in time range)  senna (SENOKOT) tablet 8.6 mg (8.6 mg Oral Given 08/16/18 2317)  acetaminophen (TYLENOL) tablet 325 mg (has no administration in time range)  0.9 %  sodium chloride infusion ( Intravenous New Bag/Given 08/16/18 2247)  cefTRIAXone (ROCEPHIN) 1 g in sodium chloride 0.9 % 100 mL IVPB (has no administration in time range)  azithromycin (ZITHROMAX) 500 mg in sodium chloride 0.9 % 250 mL IVPB (has no administration in time range)  heparin injection 5,000 Units (5,000 Units Subcutaneous Given 08/16/18 2248)  0.9 %  sodium chloride infusion ( Intravenous Duplicate 02/20/92 7169)  cefTRIAXone (ROCEPHIN) 1 g in sodium chloride 0.9 % 100 mL IVPB (0 g Intravenous  Stopped 08/16/18 2008)  azithromycin (ZITHROMAX) 500 mg in sodium chloride 0.9 % 250 mL IVPB (0 mg Intravenous Stopped 08/16/18 2109)  acetaminophen (TYLENOL) tablet 500 mg (500 mg Oral Given 08/16/18 2004)  iopamidol (ISOVUE-370) 76 % injection 100 mL (100 mLs Intravenous Contrast Given 08/16/18 1946)     Initial Impression / Assessment and Plan / ED Course  I have reviewed the triage vital signs and the nursing notes.  Pertinent labs & imaging results that were available during my care of the patient were reviewed by me and considered in my medical decision making (see chart for details).        Deborah Shields is a 78 y.o. female with a past medical history significant for hypertension, hyperlipidemia, hypothyroidism, carotid disease, CKD, paroxysmal atrial fibrillation and patient report of prior pulmonary embolism not on anticoagulation, COPD, anxiety, recurrent pneumonias, and osteopenia who presents with 2 days of fevers, chills, congestion, cough, shortness of breath, chest pain, urinary frequency, and bilateral flank pains.  Patient reports that she was doing well until yesterday when she started having the chest pain shortness of breath congestion and cough.  She reports that she feels she has pneumonia.  She reports that when she had blood clot in her lung she did not have pain at the time.  She reports no hemoptysis.  She reports that she feels fatigued and dehydrated and had decreased oral intake today with nausea.  She denies vomiting.  She reports diarrhea.  She says that she is had some urinary frequency yesterday but today had decreased urination.  She reports her pain is moderate to severe across her chest and is sharp and very pleuritic.  She reports the pain also goes down her bilateral flanks but does not involve her back and CVA areas.  She reports shortness of breath.  Patient was sent from [facility] for further evaluation and management.  On exam, patient is tachycardic.   Patient is tachypneic.  Patient is warm to the touch with an oral temperature of 99.  Rectal temperature will be obtained.  Patient's lungs had some coarseness bilaterally.  No significant wheezing.  Murmur was appreciated.  Chest and back were nontender.  Abdomen was nontender.  Flanks were nontender.  Legs were nonedematous and nontender.  Patient reports she has gout in both of her great toes that has been flaring up over the last 2 days.  Exam otherwise unremarkable.  EKG shows no STEMI.  Patient had sinus tachycardia.  Clinically I am concerned about pneumonia given the history of pneumonias, chest pain, productive cough, fever, tachycardia, and tachypnea.  Rectal temperature returned at 102.1.  Patient was made a code sepsis for pneumonia.  I am also concerned about patient's history of PE.  Will obtain troponin due to the chest pain and shortness of breath as well as other laboratory testing.  Will check urinalysis for the frequency and the decreased urine.  Troponin came back elevated 0.54, elevated from last month.  Leukocytosis  present at 20.6.  Creatinine is more elevated 1.59 however GFR is 31.  X-ray shows pneumonia.  Patient will have PE study ordered the patient will be treated for pneumonia and will be admitted for further management.  Suspect demand ischemia in the setting of pneumonia causing her troponin however will rule out PE first.  Will await Tylenol administration until hepatic function is returned.  7:14 PM GFR came back low at 31.  Had a conversation with the CT tech on getting a PE study or not.  We will speak with admitting team first to determine if they would rather her be rehydrated and trended her troponin or go ahead and get the CT scan.  Hospitalist team called for admission.   Final Clinical Impressions(s) / ED Diagnoses   Final diagnoses:  Pneumonia due to infectious organism, unspecified laterality, unspecified part of lung  Sepsis, due to unspecified  organism, unspecified whether acute organ dysfunction present [facility])    ED Discharge Orders    None      Clinical Impression: 1. Pneumonia due to infectious organism, unspecified laterality, unspecified part of lung   2. Sepsis, due to unspecified organism, unspecified whether acute organ dysfunction present [facility])     Disposition: Admit  This note was prepared with assistance of Dragon voice recognition software. Occasional wrong-word or sound-a-like substitutions may have occurred due to the inherent limitations of voice recognition software.         , Gwenyth Allegra, MD 08/17/18 [phone]

## 2018-08-16 NOTE — ED Notes (Signed)
Date and time results received: 08/16/18 1909 (use smartphrase ".now" to insert current time)  Test: lactic acid  Critical Value: 3.1  Name of Provider Notified: Dr. Sherry Ruffing  Orders Received? Or Actions Taken?: Actions Taken: notified Dr Sherry Ruffing of lactic acid 3.1

## 2018-08-16 NOTE — ED Notes (Signed)
Bed: WA17 Expected date:  Expected time:  Means of arrival:  Comments: 

## 2018-08-16 NOTE — Progress Notes (Signed)
CRITICAL VALUE ALERT  Critical Value:  LA 3.2  Date & Time Notied:  08/16/18 @ 2336  Provider Notified: [place]  Orders Received/Actions taken: Awaiting

## 2018-08-17 DIAGNOSIS — E782 Mixed hyperlipidemia: Secondary | ICD-10-CM

## 2018-08-17 DIAGNOSIS — I480 Paroxysmal atrial fibrillation: Secondary | ICD-10-CM

## 2018-08-17 LAB — URINALYSIS, ROUTINE W REFLEX MICROSCOPIC
Bacteria, UA: NONE SEEN
Bilirubin Urine: NEGATIVE
Glucose, UA: NEGATIVE mg/dL
Ketones, ur: NEGATIVE mg/dL
Leukocytes,Ua: NEGATIVE
Nitrite: NEGATIVE
Protein, ur: NEGATIVE mg/dL
Specific Gravity, Urine: 1.024 (ref 1.005–1.030)
pH: 6 (ref 5.0–8.0)

## 2018-08-17 LAB — COMPREHENSIVE METABOLIC PANEL
ALT: 16 U/L (ref 0–44)
AST: 21 U/L (ref 15–41)
Albumin: 2.5 g/dL — ABNORMAL LOW (ref 3.5–5.0)
Alkaline Phosphatase: 100 U/L (ref 38–126)
Anion gap: 10 (ref 5–15)
BUN: 23 mg/dL (ref 8–23)
CO2: 24 mmol/L (ref 22–32)
Calcium: 8.2 mg/dL — ABNORMAL LOW (ref 8.9–10.3)
Chloride: 102 mmol/L (ref 98–111)
Creatinine, Ser: 1.48 mg/dL — ABNORMAL HIGH (ref 0.44–1.00)
GFR calc Af Amer: 39 mL/min — ABNORMAL LOW (ref >60)
GFR calc non Af Amer: 33 mL/min — ABNORMAL LOW (ref >60)
Glucose, Bld: 114 mg/dL — ABNORMAL HIGH (ref 70–99)
Potassium: 3 mmol/L — ABNORMAL LOW (ref 3.5–5.1)
Sodium: 136 mmol/L (ref 135–145)
Total Bilirubin: 0.8 mg/dL (ref 0.3–1.2)
Total Protein: 5.6 g/dL — ABNORMAL LOW (ref 6.5–8.1)

## 2018-08-17 LAB — CBC
HCT: 36.3 % (ref 36.0–46.0)
Hemoglobin: 11.2 g/dL — ABNORMAL LOW (ref 12.0–15.0)
MCH: 28.9 pg (ref 26.0–34.0)
MCHC: 30.9 g/dL (ref 30.0–36.0)
MCV: 93.6 fL (ref 80.0–100.0)
Platelets: 249 10*3/uL (ref 150–400)
RBC: 3.88 MIL/uL (ref 3.87–5.11)
RDW: 15.1 % (ref 11.5–15.5)
WBC: 17 10*3/uL — ABNORMAL HIGH (ref 4.0–10.5)
nRBC: 0 % (ref 0.0–0.2)

## 2018-08-17 LAB — RESPIRATORY PANEL BY PCR

## 2018-08-17 LAB — HIV ANTIBODY (ROUTINE TESTING W REFLEX): HIV Screen 4th Generation wRfx: NONREACTIVE

## 2018-08-17 LAB — STREP PNEUMONIAE URINARY ANTIGEN: Strep Pneumo Urinary Antigen: NEGATIVE

## 2018-08-17 LAB — EXPECTORATED SPUTUM ASSESSMENT W GRAM STAIN, RFLX TO RESP C

## 2018-08-17 LAB — TSH: TSH: 1.038 u[IU]/mL (ref 0.350–4.500)

## 2018-08-17 MED ORDER — POTASSIUM CHLORIDE CRYS ER 20 MEQ PO TBCR
40.0000 meq | EXTENDED_RELEASE_TABLET | ORAL | Status: AC
  Administered 2018-08-17 (×2): 40 meq via ORAL
  Filled 2018-08-17 (×2): qty 2

## 2018-08-17 NOTE — Progress Notes (Signed)
PROGRESS NOTE    Deborah Shields  YQM:[id number] DOB: [birth year] DOA: 08/16/2018 PCP: Lajean Manes, MD   Chief complaint: Chest pain, shortness of breath, abdominal pain  Brief Narrative:   Deborah Shields is a 78 y.o. female with medical history significant of history of coronary artery disease, chronic kidney disease stage III, anxiety disorder, previous DVTs, hypothyroidism and peripheral vascular disease who was brought in with family complaining of chest pain shortness of breath and cough.  Chest pain is pleuritic in nature.  Associated with some tachycardia.  Also low-grade fever. Patient was seen and evaluated and found to have what appears to be pneumonia on chest x-ray.  Subsequent CT angiogram of the chest revealed no PE.  She is septic with elevated lactic acid.  She has had productive cough.  Denied any hemoptysis.  Denied any nausea or vomiting.  She has generalized malaise.  Patient subsequently admitted with a diagnosis of sepsis due to pneumonia..  ED Course: Temperature is 102.1 with blood pressure 135/92 pulse 112 respiratory 22 oxygen sat 97% on 2 L.  White count is 20,000.  With normal hemoglobin and platelets.  Chemistry appears within normal except for creatinine of 1.59 which is at baseline.  Lactic acid of 3.1.  Chest x-ray showed patchy airspace opacity in the right lung suggesting pneumonia.  Subsequent CT angiogram of the chest also confirmed pneumonia.  Patient has received IV fluids and initial Rocephin and Zithromax for community-acquired pneumonia and is being admitted to the medical service.    Assessment & Plan:   Principal Problem:   Sepsis ([place]) Active Problems:   Hypothyroidism   HYPERLIPIDEMIA   Essential hypertension   Chronic renal insufficiency, stage III (moderate) (HCC)   PAF (paroxysmal atrial fibrillation) ([place])   Community acquired pneumonia   Sepsis Right upper/lower lobe pneumonia, suspect gram-negative organism Patient  presenting with chest discomfort, shortness of breath found to have a right upper/lower lobe multifocal pneumonia on CT scan.  Was febrile to 102.1 with a WBC count of 20.6.  Lactic acid elevated at 3.1.  Patient was started on antibiotics with ceftriaxone and azithromycin. --WBC count improving, 20.6-->17.0 --Blood cultures x2: Pending --Respiratory viral panel: Pending --Sputum culture: Pending --Continue antibiotics with ceftriaxone and azithromycin --Transfer to MedSurg floor  Hypokalemia Potassium 3.0 this morning.  We will replete. --Recheck electrolytes in the a.m. to include magnesium.  Elevated troponin Troponin elevated at 0.54 on admission.  Initial EKG showing sinus tachycardia without any concerning ST elevation/depression or concerning T wave inversions.  Normal intervals.  Now in normal sinus rhythm on telemetry.  Reviewed telemetry without any concerning events overnight.  No continued chest pain.  Suspect elevated troponin in the setting of demand ischemia from sepsis as above.  No further evaluation required at this time.  Hypothyroidism Last TSH in EMR on 01/16/2011 was 5.23.  Will check TSH level. --Continue levothyroxine 88 mcg p.o. daily  Essential hypertension History of paroxysmal atrial fibrillation Currently in normal sinus rhythm on telemetry.  On admission was in sinus tachycardia.  No concerning events on telemetry. --Continue metoprolol succinate 25 mg p.o. BID   DVT prophylaxis: Heparin Code Status: Full code Family Communication: None Disposition Plan: Transfer to med telemetry, anticipate discharge home in 1-2 days pending clinical improvement.  Consultants: None  Procedures: None   Antimicrobials:  Azithromycin 2/23>>  Ceftriaxone 2/23>>   Subjective: Patient seen and examined at bedside, sleeping; easily arousable.  States feeling improved since admission on IV antibiotics.  No  further fevers this morning.  Tenuous with mild shortness of  breath and nonproductive cough.  No other complaints at this time.  Has headache, no fever/chills/night sweats, no visual changes, no nausea/vomiting/diarrhea, no chest pain, no palpitations, abdominal pain, no issues with bowel/bladder function, no weakness, no paresthesias.  No acute concerns overnight per nursing staff.  Objective: Vitals:   08/17/18 0100 08/17/18 0200 08/17/18 0300 08/17/18 0400  BP: 116/72 (!) 126/45 (!) 150/81 (!) 143/97  Pulse: 72   73  Resp: 14 11 12 12   Temp:    (!) 97.4 F (36.3 C)  TempSrc:    Oral  SpO2: 97%   99%  Weight:      Height:        Intake/Output Summary (Last 24 hours) at 08/17/2018 0756 Last data filed at 08/17/2018 0500 Gross per 24 hour  Intake 741.13 ml  Output 600 ml  Net 141.13 ml   Filed Weights   08/16/18 1618  Weight: 50.6 kg    Examination:  General exam: Appears calm and comfortable  Respiratory system: Breath sounds slightly decreased bilateral bases, slight crackles right base, otherwise clear. Respiratory effort normal, oxygenating well on room air. Cardiovascular system: S1 & S2 heard, RRR. No JVD, murmurs, rubs, gallops or clicks. No pedal edema. Gastrointestinal system: Abdomen is nondistended, soft and nontender. No organomegaly or masses felt. Normal bowel sounds heard. Central nervous system: Alert and oriented. No focal neurological deficits. Extremities: Symmetric 5 x 5 power. Skin: No rashes, lesions or ulcers Psychiatry: Judgement and insight appear normal. Mood & affect appropriate.     Data Reviewed: I have personally reviewed following labs and imaging studies  CBC: Recent Labs  Lab 08/16/18 1718 08/17/18 0353  WBC 20.6* 17.0*  HGB 13.6 11.2*  HCT 42.9 36.3  MCV 91.3 93.6  PLT 291 160   Basic Metabolic Panel: Recent Labs  Lab 08/16/18 1718 08/17/18 0353  NA 136 136  K 3.8 3.0*  CL 99 102  CO2 26 24  GLUCOSE 101* 114*  BUN 23 23  CREATININE 1.59* 1.48*  CALCIUM 9.1 8.2*    GFR: Estimated Creatinine Clearance: 24.6 mL/min (A) (by C-G formula based on SCr of 1.48 mg/dL (H)). Liver Function Tests: Recent Labs  Lab 08/16/18 1813 08/17/18 0353  AST 36 21  ALT 20 16  ALKPHOS 142* 100  BILITOT 1.9* 0.8  PROT 7.7 5.6*  ALBUMIN 3.7 2.5*   Recent Labs  Lab 08/16/18 1813  LIPASE 39   No results for input(s): AMMONIA in the last 168 hours. Coagulation Profile: No results for input(s): INR, PROTIME in the last 168 hours. Cardiac Enzymes: No results for input(s): CKTOTAL, CKMB, CKMBINDEX, TROPONINI in the last 168 hours. BNP (last 3 results) No results for input(s): PROBNP in the last 8760 hours. HbA1C: No results for input(s): HGBA1C in the last 72 hours. CBG: No results for input(s): GLUCAP in the last 168 hours. Lipid Profile: No results for input(s): CHOL, HDL, LDLCALC, TRIG, CHOLHDL, LDLDIRECT in the last 72 hours. Thyroid Function Tests: No results for input(s): TSH, T4TOTAL, FREET4, T3FREE, THYROIDAB in the last 72 hours. Anemia Panel: No results for input(s): VITAMINB12, FOLATE, FERRITIN, TIBC, IRON, RETICCTPCT in the last 72 hours. Sepsis Labs: Recent Labs  Lab 08/16/18 1814 08/16/18 2208  LATICACIDVEN 3.1* 3.2*    No results found for this or any previous visit (from the past 240 hour(s)).       Radiology Studies: Dg Chest 2 View  Result Date: 08/16/2018  CLINICAL DATA:  Chest pain and shortness of breath EXAM: CHEST - 2 VIEW COMPARISON:  07/15/2017 FINDINGS: Hyperexpansion is consistent with emphysema. Patchy areas of airspace disease are seen in the right upper and mid lung. Left lung clear. The cardiopericardial silhouette is within normal limits for size. The visualized bony structures of the thorax are intact. IMPRESSION: Patchy airspace opacity in the right lung suggests pneumonia. Follow-up imaging recommended to ensure resolution. Electronically Signed   By: Misty Stanley M.D.   On: 08/16/2018 16:59   Ct Angio Chest Pe W  And/or Wo Contrast  Result Date: 08/16/2018 CLINICAL DATA:  Shortness of breath and chest pain. EXAM: CT ANGIOGRAPHY CHEST WITH CONTRAST TECHNIQUE: Multidetector CT imaging of the chest was performed using the standard protocol during bolus administration of intravenous contrast. Multiplanar CT image reconstructions and MIPs were obtained to evaluate the vascular anatomy. CONTRAST:  181mL ISOVUE-370 IOPAMIDOL (ISOVUE-370) INJECTION 76% COMPARISON:  07/15/2017 FINDINGS: Cardiovascular: The heart size is normal. No substantial pericardial effusion. Atherosclerotic calcification is noted in the wall of the thoracic aorta. No filling defect in the opacified pulmonary arteries to suggest the presence of an acute pulmonary embolus. Mediastinum/Nodes: No mediastinal lymphadenopathy. 10 mm short axis right hilar lymph node is upper normal for size. No left hilar lymphadenopathy. The esophagus has normal imaging features. There is no axillary lymphadenopathy. Lungs/Pleura: The central tracheobronchial airways are patent. Centrilobular emphsyema noted. Biapical pleuroparenchymal scarring is similar to prior. Areas of architectural distortion and scarring are again noted in the lungs bilaterally. Interstitial and airspace disease in the anterior right upper lobe (39/10) is new in the interval. Right middle lobe volume loss and scarring is not substantially changed since prior. Interstitial and patchy airspace disease in the peripheral right lower lobe (84/10) is new since prior. 11 mm confluent nodular focus is identified within this airspace disease. Scarring in the anterior right lower lobe (107/10) is stable. Clustered areas of tree-in-bud nodularity in the left lung are similar and compatible with sequelae of atypical infection. No substantial pleural effusion. Upper Abdomen: Probable cyst upper pole left kidney. Musculoskeletal: No worrisome lytic or sclerotic osseous abnormality. Review of the MIP images confirms the  above findings. IMPRESSION: 1. No CT evidence for acute pulmonary embolus. 2. Interval development of patchy airspace disease in the anterior right upper lobe and peripheral right lower lobe. Imaging features suggest multifocal pneumonia. Follow-up imaging recommended to ensure resolution. 3. Stable appearance of scattered areas of architectural distortion and scarring in the lungs bilaterally. 4.  Emphysema. (ICD10-J43.9) 5.  Aortic Atherosclerois (ICD10-170.0) Electronically Signed   By: Misty Stanley M.D.   On: 08/16/2018 20:09        Scheduled Meds: . acidophilus   Oral QPC breakfast  . B-complex with vitamin C  1 tablet Oral QPC supper  . heparin  5,000 Units Subcutaneous Q8H  . levothyroxine  88 mcg Oral QAC breakfast  . LORazepam  0.5 mg Oral QHS  . meclizine  12.5-25 mg Oral QHS  . metoprolol succinate  25 mg Oral BID PC  . multivitamin with minerals   Oral QPC supper  . polycarbophil   Oral Daily  . potassium chloride  40 mEq Oral Q4H  . senna  1 tablet Oral Daily   Continuous Infusions: . sodium chloride    . azithromycin    . cefTRIAXone (ROCEPHIN)  IV       LOS: 1 day    Time spent: 36 minutes    Eric J [place] DO  [facility] Pager [phone]  If 7PM-7AM, please contact night-coverage [email or web address] Password [facility] 08/17/2018, 7:56 AM

## 2018-08-18 LAB — BASIC METABOLIC PANEL
Anion gap: 5 (ref 5–15)
BUN: 13 mg/dL (ref 8–23)
CO2: 21 mmol/L — ABNORMAL LOW (ref 22–32)
Calcium: 7.8 mg/dL — ABNORMAL LOW (ref 8.9–10.3)
Chloride: 113 mmol/L — ABNORMAL HIGH (ref 98–111)
Creatinine, Ser: 1.24 mg/dL — ABNORMAL HIGH (ref 0.44–1.00)
GFR calc Af Amer: 48 mL/min — ABNORMAL LOW (ref >60)
GFR calc non Af Amer: 41 mL/min — ABNORMAL LOW (ref >60)
Glucose, Bld: 89 mg/dL (ref 70–99)
Potassium: 4 mmol/L (ref 3.5–5.1)
Sodium: 139 mmol/L (ref 135–145)

## 2018-08-18 LAB — CBC
HCT: 30.8 % — ABNORMAL LOW (ref 36.0–46.0)
Hemoglobin: 9.5 g/dL — ABNORMAL LOW (ref 12.0–15.0)
MCH: 28.9 pg (ref 26.0–34.0)
MCHC: 30.8 g/dL (ref 30.0–36.0)
MCV: 93.6 fL (ref 80.0–100.0)
Platelets: 239 10*3/uL (ref 150–400)
RBC: 3.29 MIL/uL — ABNORMAL LOW (ref 3.87–5.11)
RDW: 15.2 % (ref 11.5–15.5)
WBC: 8.5 10*3/uL (ref 4.0–10.5)
nRBC: 0 % (ref 0.0–0.2)

## 2018-08-18 LAB — MAGNESIUM: Magnesium: 1.8 mg/dL (ref 1.7–2.4)

## 2018-08-18 LAB — URINE CULTURE: Culture: NO GROWTH

## 2018-08-18 LAB — MRSA PCR SCREENING: MRSA by PCR: POSITIVE — AB

## 2018-08-18 NOTE — Progress Notes (Signed)
PT Cancellation Note  Patient Details Name: Deborah Shields MRN: [id number] DOB: 19-Mar-1941   Cancelled Treatment:    Reason Eval/Treat Not Completed: Fatigue/lethargy limiting ability to participate.  Pt reports fatigue from her morning routine, now is asking for another time.  Will try again in the PM.   Ramond Dial 08/18/2018, 10:05 AM  Mee Hives, PT MS Acute Rehab Dept. Number: [place] [facility] [place]

## 2018-08-18 NOTE — Progress Notes (Signed)
PROGRESS NOTE    Deborah Shields  ERX:[id number] DOB: 03/27/1941 DOA: 08/16/2018 PCP: Lajean Manes, MD   Chief complaint: Chest pain, shortness of breath, abdominal pain  Brief Narrative:   Deborah Shields is a 78 y.o. female with medical history significant of history of coronary artery disease, chronic kidney disease stage III, anxiety disorder, previous DVTs, hypothyroidism and peripheral vascular disease who was brought in with family complaining of chest pain shortness of breath and cough.  Chest pain is pleuritic in nature.  Associated with some tachycardia.  Also low-grade fever. Patient was seen and evaluated and found to have what appears to be pneumonia on chest x-ray.  Subsequent CT angiogram of the chest revealed no PE.  She is septic with elevated lactic acid.  She has had productive cough.  Denied any hemoptysis.  Denied any nausea or vomiting.  She has generalized malaise.  Patient subsequently admitted with a diagnosis of sepsis due to pneumonia..  ED Course: Temperature is 102.1 with blood pressure 135/92 pulse 112 respiratory 22 oxygen sat 97% on 2 L.  White count is 20,000.  With normal hemoglobin and platelets.  Chemistry appears within normal except for creatinine of 1.59 which is at baseline.  Lactic acid of 3.1.  Chest x-ray showed patchy airspace opacity in the right lung suggesting pneumonia.  Subsequent CT angiogram of the chest also confirmed pneumonia.  Patient has received IV fluids and initial Rocephin and Zithromax for community-acquired pneumonia and is being admitted to the medical service.    Assessment & Plan:   Principal Problem:   Sepsis ([place]) Active Problems:   Hypothyroidism   HYPERLIPIDEMIA   Essential hypertension   Chronic renal insufficiency, stage III (moderate) (HCC)   PAF (paroxysmal atrial fibrillation) ([place])   Community acquired pneumonia   Sepsis Right upper/lower lobe pneumonia, suspect gram-negative organism Patient  presenting with chest discomfort, shortness of breath found to have a right upper/lower lobe multifocal pneumonia on CT scan.  Was febrile to 102.1 with a WBC count of 20.6.  Lactic acid elevated at 3.1.  Patient was started on antibiotics with ceftriaxone and azithromycin. --WBC count improving, 20.6-->17.0-->8.5 --T-max overnight 100.6 --Blood cultures x2: No growth x24 hours --Respiratory viral panel: Negative --Sputum culture: Pending --Continue antibiotics with ceftriaxone and azithromycin --Once afebrile greater than 24 hours, plan transition to oral antibiotics and discharge home  Hypokalemia: Resolved Potassium 4.0 this morning.   --Recheck electrolytes in the a.m. to include magnesium.  Elevated troponin Troponin elevated at 0.54 on admission.  Initial EKG showing sinus tachycardia without any concerning ST elevation/depression or concerning T wave inversions.  Normal intervals.  Now in normal sinus rhythm on telemetry.  Reviewed telemetry without any concerning events overnight.  No continued chest pain.  Suspect elevated troponin in the setting of demand ischemia from sepsis as above.  No further evaluation required at this time.  Hypothyroidism TSH 1.038 --Continue levothyroxine 88 mcg p.o. daily  Essential hypertension History of paroxysmal atrial fibrillation Currently in normal sinus rhythm on telemetry.  On admission was in sinus tachycardia.  No concerning events on telemetry noted on telemetry, now discontinued. --Continue metoprolol succinate 25 mg p.o. BID  Weakness/deconditioning: --PT evaluation for discharge needs   DVT prophylaxis: Heparin Code Status: Full code Family Communication: None Disposition Plan: PT evaluation for discharge needs, anticipate discharge home in 1-2 days  Consultants: None  Procedures: None   Antimicrobials:  Azithromycin 2/23>>  Ceftriaxone 2/23>>   Subjective: Patient seen and examined, resting comfortably  in bed.  Family  present.  Respiratory status improved, continues with some mild shortness of breath and cough.  T-max overnight of 100.6, improving.  White count is now normalized.  Complains of some weakness and gait disturbance.  No other complaints at this time.  Denies headache, no fever/chills/night sweats, no visual changes, no nausea/vomiting/diarrhea, no chest pain, no palpitations, abdominal pain, no issues with bowel/bladder function, no weakness, no paresthesias.  No acute concerns overnight per nursing staff.  Objective: Vitals:   08/17/18 1152 08/17/18 2115 08/17/18 2251 08/18/18 0505  BP: 134/72 (!) 150/73  126/71  Pulse: (!) 40 91  78  Resp: 16 17  18   Temp: (!) 97.5 F (36.4 C) (!) 100.6 F (38.1 C) 98.6 F (37 C) 98.3 F (36.8 C)  TempSrc:   Oral   SpO2: (!) 78% 100%  95%  Weight:      Height:        Intake/Output Summary (Last 24 hours) at 08/18/2018 1255 Last data filed at 08/18/2018 0400 Gross per 24 hour  Intake 1762.83 ml  Output -  Net 1762.83 ml   Filed Weights   08/16/18 1618  Weight: 50.6 kg    Examination:  General exam: Appears calm and comfortable  Respiratory system: Breath sounds slightly decreased bilateral bases, slight crackles right base, otherwise clear. Respiratory effort normal, oxygenating well on room air. Cardiovascular system: S1 & S2 heard, RRR. No JVD, murmurs, rubs, gallops or clicks. No pedal edema. Gastrointestinal system: Abdomen is nondistended, soft and nontender. No organomegaly or masses felt. Normal bowel sounds heard. Central nervous system: Alert and oriented. No focal neurological deficits. Extremities: Symmetric 5 x 5 power. Skin: No rashes, lesions or ulcers Psychiatry: Judgement and insight appear normal. Mood & affect appropriate.     Data Reviewed: I have personally reviewed following labs and imaging studies  CBC: Recent Labs  Lab 08/16/18 1718 08/17/18 0353 08/18/18 0615  WBC 20.6* 17.0* 8.5  HGB 13.6 11.2* 9.5*  HCT  42.9 36.3 30.8*  MCV 91.3 93.6 93.6  PLT 291 249 194   Basic Metabolic Panel: Recent Labs  Lab 08/16/18 1718 08/17/18 0353 08/18/18 0615  NA 136 136 139  K 3.8 3.0* 4.0  CL 99 102 113*  CO2 26 24 21*  GLUCOSE 101* 114* 89  BUN 23 23 13   CREATININE 1.59* 1.48* 1.24*  CALCIUM 9.1 8.2* 7.8*  MG  --   --  1.8   GFR: Estimated Creatinine Clearance: 29.4 mL/min (A) (by C-G formula based on SCr of 1.24 mg/dL (H)). Liver Function Tests: Recent Labs  Lab 08/16/18 1813 08/17/18 0353  AST 36 21  ALT 20 16  ALKPHOS 142* 100  BILITOT 1.9* 0.8  PROT 7.7 5.6*  ALBUMIN 3.7 2.5*   Recent Labs  Lab 08/16/18 1813  LIPASE 39   No results for input(s): AMMONIA in the last 168 hours. Coagulation Profile: No results for input(s): INR, PROTIME in the last 168 hours. Cardiac Enzymes: No results for input(s): CKTOTAL, CKMB, CKMBINDEX, TROPONINI in the last 168 hours. BNP (last 3 results) No results for input(s): PROBNP in the last 8760 hours. HbA1C: No results for input(s): HGBA1C in the last 72 hours. CBG: No results for input(s): GLUCAP in the last 168 hours. Lipid Profile: No results for input(s): CHOL, HDL, LDLCALC, TRIG, CHOLHDL, LDLDIRECT in the last 72 hours. Thyroid Function Tests: Recent Labs    08/17/18 0846  TSH 1.038   Anemia Panel: No results for input(s): VITAMINB12,  FOLATE, FERRITIN, TIBC, IRON, RETICCTPCT in the last 72 hours. Sepsis Labs: Recent Labs  Lab 08/16/18 1814 08/16/18 2208  LATICACIDVEN 3.1* 3.2*    Recent Results (from the past 240 hour(s))  Blood culture (routine x 2)     Status: None (Preliminary result)   Collection Time: 08/16/18  6:25 PM  Result Value Ref Range Status   Specimen Description   Final    BLOOD LEFT ANTECUBITAL Performed at [facility], [place] [address], [place], [place] [address]    Special Requests   Final    BOTTLES DRAWN AEROBIC AND ANAEROBIC Blood Culture adequate volume Performed at [facility] [address], [place], [place] [address]    Culture   Final    NO GROWTH < 24 HOURS Performed at [place] [address], [place], [place] [address]    Report Status PENDING  Incomplete  Blood culture (routine x 2)     Status: None (Preliminary result)   Collection Time: 08/16/18  6:25 PM  Result Value Ref Range Status   Specimen Description BLOOD LEFT FOREARM  Final   Special Requests   Final    BOTTLES DRAWN AEROBIC AND ANAEROBIC Blood Culture results may not be optimal due to an excessive volume of blood received in culture bottles Performed at [facility], [place] [address], [place], [place] [address]    Culture   Final    NO GROWTH < 24 HOURS Performed at [facility] [address] [place], [place] [address]    Report Status PENDING  Incomplete  Respiratory Panel by PCR     Status: None   Collection Time: 08/16/18 11:10 PM  Result Value Ref Range Status   Adenovirus NOT DETECTED NOT DETECTED Final   Coronavirus 229E NOT DETECTED NOT DETECTED Final    Comment: (NOTE) The Coronavirus on the Respiratory Panel, DOES NOT test for the novel  Coronavirus (2019 nCoV)    Coronavirus HKU1 NOT DETECTED NOT DETECTED Final   Coronavirus NL63 NOT DETECTED NOT DETECTED Final   Coronavirus OC43 NOT DETECTED NOT DETECTED Final   Metapneumovirus NOT DETECTED NOT DETECTED Final   Rhinovirus / Enterovirus NOT DETECTED NOT DETECTED Final   Influenza A NOT DETECTED NOT DETECTED Final   Influenza B NOT DETECTED NOT DETECTED Final   Parainfluenza Virus 1 NOT DETECTED NOT DETECTED Final   Parainfluenza Virus 2 NOT DETECTED NOT DETECTED Final   Parainfluenza Virus 3 NOT DETECTED NOT DETECTED Final   Parainfluenza Virus 4 NOT DETECTED NOT DETECTED Final   Respiratory Syncytial Virus NOT DETECTED NOT DETECTED Final   Bordetella pertussis NOT DETECTED NOT DETECTED Final   Chlamydophila pneumoniae NOT DETECTED NOT DETECTED Final    Mycoplasma pneumoniae NOT DETECTED NOT DETECTED Final    Comment: Performed at [facility], [address] [place], [place] [address]  Urine culture     Status: None   Collection Time: 08/17/18  5:11 AM  Result Value Ref Range Status   Specimen Description   Final    URINE, CLEAN CATCH Performed at [facility], [place] [address] [place], [place] [address]    Special Requests   Final    NONE Performed at [facility], [place] [address], [place], [place] [address]    Culture   Final    NO GROWTH Performed at [facility], [place] [address], [place], [place] [address]  Report Status 08/18/2018 FINAL  Final  Culture, sputum-assessment     Status: None   Collection Time: 08/17/18  4:30 PM  Result Value Ref Range Status   Specimen Description SPU EXPECTORATED  Final   Special Requests NONE  Final   Sputum evaluation   Final    THIS SPECIMEN IS ACCEPTABLE FOR SPUTUM CULTURE Performed at [facility], [place] [address], [place], [place] [address]    Report Status 08/17/2018 FINAL  Final  Culture, respiratory     Status: None (Preliminary result)   Collection Time: 08/17/18  4:30 PM  Result Value Ref Range Status   Specimen Description   Final    SPU EXPECTORATED Performed at [place] [address], [place], [place] [address]    Special Requests   Final    NONE Reflexed from [id number] Performed at [place] [address] [place], [place] [address]    Gram Stain   Final    FEW WBC PRESENT, PREDOMINANTLY PMN FEW GRAM POSITIVE COCCI IN PAIRS IN CLUSTERS RARE YEAST    Culture   Final    CULTURE REINCUBATED FOR BETTER GROWTH Performed at [facility], [place] [address], [place],  [address]    Report Status PENDING  Incomplete         Radiology Studies: Dg Chest 2 View  Result Date: 08/16/2018 CLINICAL DATA:  Chest pain and shortness of breath EXAM: CHEST  - 2 VIEW COMPARISON:  07/15/2017 FINDINGS: Hyperexpansion is consistent with emphysema. Patchy areas of airspace disease are seen in the right upper and mid lung. Left lung clear. The cardiopericardial silhouette is within normal limits for size. The visualized bony structures of the thorax are intact. IMPRESSION: Patchy airspace opacity in the right lung suggests pneumonia. Follow-up imaging recommended to ensure resolution. Electronically Signed   By: Misty Stanley M.D.   On: 08/16/2018 16:59   Ct Angio Chest Pe W And/or Wo Contrast  Result Date: 08/16/2018 CLINICAL DATA:  Shortness of breath and chest pain. EXAM: CT ANGIOGRAPHY CHEST WITH CONTRAST TECHNIQUE: Multidetector CT imaging of the chest was performed using the standard protocol during bolus administration of intravenous contrast. Multiplanar CT image reconstructions and MIPs were obtained to evaluate the vascular anatomy. CONTRAST:  119mL ISOVUE-370 IOPAMIDOL (ISOVUE-370) INJECTION 76% COMPARISON:  07/15/2017 FINDINGS: Cardiovascular: The heart size is normal. No substantial pericardial effusion. Atherosclerotic calcification is noted in the wall of the thoracic aorta. No filling defect in the opacified pulmonary arteries to suggest the presence of an acute pulmonary embolus. Mediastinum/Nodes: No mediastinal lymphadenopathy. 10 mm short axis right hilar lymph node is upper normal for size. No left hilar lymphadenopathy. The esophagus has normal imaging features. There is no axillary lymphadenopathy. Lungs/Pleura: The central tracheobronchial airways are patent. Centrilobular emphsyema noted. Biapical pleuroparenchymal scarring is similar to prior. Areas of architectural distortion and scarring are again noted in the lungs bilaterally. Interstitial and airspace disease in the anterior right upper lobe (39/10) is new in the interval. Right middle lobe volume loss and scarring is not substantially changed since prior. Interstitial and patchy airspace  disease in the peripheral right lower lobe (84/10) is new since prior. 11 mm confluent nodular focus is identified within this airspace disease. Scarring in the anterior right lower lobe (107/10) is stable. Clustered areas of tree-in-bud nodularity in the left lung are similar and compatible with sequelae of atypical infection. No substantial pleural effusion. Upper Abdomen: Probable cyst upper pole left kidney.  Musculoskeletal: No worrisome lytic or sclerotic osseous abnormality. Review of the MIP images confirms the above findings. IMPRESSION: 1. No CT evidence for acute pulmonary embolus. 2. Interval development of patchy airspace disease in the anterior right upper lobe and peripheral right lower lobe. Imaging features suggest multifocal pneumonia. Follow-up imaging recommended to ensure resolution. 3. Stable appearance of scattered areas of architectural distortion and scarring in the lungs bilaterally. 4.  Emphysema. (ICD10-J43.9) 5.  Aortic Atherosclerois (ICD10-170.0) Electronically Signed   By: Misty Stanley M.D.   On: 08/16/2018 20:09        Scheduled Meds: . acidophilus   Oral QPC breakfast  . B-complex with vitamin C  1 tablet Oral QPC supper  . heparin  5,000 Units Subcutaneous Q8H  . levothyroxine  88 mcg Oral QAC breakfast  . LORazepam  0.5 mg Oral QHS  . meclizine  12.5-25 mg Oral QHS  . metoprolol succinate  25 mg Oral BID PC  . multivitamin with minerals   Oral QPC supper  . polycarbophil   Oral Daily  . senna  1 tablet Oral Daily   Continuous Infusions: . sodium chloride 75 mL/hr at 08/18/18 0400  . azithromycin Stopped (08/17/18 2205)  . cefTRIAXone (ROCEPHIN)  IV Stopped (08/17/18 1816)     LOS: 2 days    Time spent: 29 minutes    Eric J [place] DO [facility] Pager [phone]  If 7PM-7AM, please contact night-coverage [email or web address] Password [facility] 08/18/2018, 12:55 PM

## 2018-08-18 NOTE — Progress Notes (Signed)
PT Cancellation Note  Patient Details Name: Deborah Shields MRN: [id number] DOB: [birth year]   Cancelled Treatment:    Reason Eval/Treat Not Completed: Other (comment).  Has been eating lunch and is tired, will try at another time as pt and schedule allow.   Ramond Dial 08/18/2018, 2:40 PM  Mee Hives, PT MS Acute Rehab Dept. Number: [place] and [place]

## 2018-08-19 DIAGNOSIS — J189 Pneumonia, unspecified organism: Secondary | ICD-10-CM

## 2018-08-19 LAB — BASIC METABOLIC PANEL
Anion gap: 6 (ref 5–15)
BUN: 8 mg/dL (ref 8–23)
CO2: 24 mmol/L (ref 22–32)
Calcium: 8.7 mg/dL — ABNORMAL LOW (ref 8.9–10.3)
Chloride: 109 mmol/L (ref 98–111)
Creatinine, Ser: 1.2 mg/dL — ABNORMAL HIGH (ref 0.44–1.00)
GFR calc Af Amer: 50 mL/min — ABNORMAL LOW (ref >60)
GFR calc non Af Amer: 43 mL/min — ABNORMAL LOW (ref >60)
Glucose, Bld: 96 mg/dL (ref 70–99)
Potassium: 4.4 mmol/L (ref 3.5–5.1)
Sodium: 139 mmol/L (ref 135–145)

## 2018-08-19 LAB — CBC
HCT: 35.7 % — ABNORMAL LOW (ref 36.0–46.0)
Hemoglobin: 10.8 g/dL — ABNORMAL LOW (ref 12.0–15.0)
MCH: 28.5 pg (ref 26.0–34.0)
MCHC: 30.3 g/dL (ref 30.0–36.0)
MCV: 94.2 fL (ref 80.0–100.0)
Platelets: 295 10*3/uL (ref 150–400)
RBC: 3.79 MIL/uL — ABNORMAL LOW (ref 3.87–5.11)
RDW: 15 % (ref 11.5–15.5)
WBC: 8.9 10*3/uL (ref 4.0–10.5)
nRBC: 0 % (ref 0.0–0.2)

## 2018-08-19 MED ORDER — SODIUM CHLORIDE 0.9 % IV SOLN
INTRAVENOUS | Status: DC
  Administered 2018-08-19 – 2018-08-21 (×3): via INTRAVENOUS

## 2018-08-19 MED ORDER — MUPIROCIN 2 % EX OINT
1.0000 "application " | TOPICAL_OINTMENT | Freq: Two times a day (BID) | CUTANEOUS | Status: DC
  Administered 2018-08-19 – 2018-08-21 (×5): 1 via NASAL
  Filled 2018-08-19: qty 22

## 2018-08-19 MED ORDER — AZITHROMYCIN 250 MG PO TABS
500.0000 mg | ORAL_TABLET | Freq: Every day | ORAL | Status: DC
  Administered 2018-08-19: 500 mg via ORAL
  Filled 2018-08-19: qty 2

## 2018-08-19 MED ORDER — CHLORHEXIDINE GLUCONATE CLOTH 2 % EX PADS
6.0000 | MEDICATED_PAD | Freq: Every day | CUTANEOUS | Status: DC
  Administered 2018-08-20 – 2018-08-21 (×2): 6 via TOPICAL

## 2018-08-19 NOTE — Care Management Important Message (Signed)
Important Message  Patient Details  Name: Deborah Shields MRN: [id number] Date of Birth: [birth year]   Medicare Important Message Given:  Yes    Kerin Salen 08/19/2018, 11:34 AMImportant Message  Patient Details  Name: Deborah Shields MRN: [id number] Date of Birth: January 20, 1941   Medicare Important Message Given:  Yes    Kerin Salen 08/19/2018, 11:34 AM

## 2018-08-19 NOTE — Progress Notes (Signed)
PROGRESS NOTE    Deborah Shields  SHF:[id number] DOB: 06/13/1941 DOA: 08/16/2018 PCP: Lajean Manes, MD   Chief complaint: Chest pain, shortness of breath, abdominal pain  Brief Narrative:   Deborah Shields is a 78 y.o. female with medical history significant of history of coronary artery disease, chronic kidney disease stage III, anxiety disorder, previous DVTs, hypothyroidism and peripheral vascular disease who was brought in with family complaining of chest pain shortness of breath and cough.  Chest pain is pleuritic in nature.  Associated with some tachycardia.  Also low-grade fever. Patient was seen and evaluated and found to have what appears to be pneumonia on chest x-ray.  Subsequent CT angiogram of the chest revealed no PE.  She is septic with elevated lactic acid.  She has had productive cough.  Denied any hemoptysis.  Denied any nausea or vomiting.  She has generalized malaise.  Patient subsequently admitted with a diagnosis of sepsis due to pneumonia. ED Course: Temperature is 102.1 with blood pressure 135/92 pulse 112 respiratory 22 oxygen sat 97% on 2 L.  White count is 20,000.  With normal hemoglobin and platelets.  Chemistry appears within normal except for creatinine of 1.59 which is at baseline.  Lactic acid of 3.1.  Chest x-ray showed patchy airspace opacity in the right lung suggesting pneumonia.  Subsequent CT angiogram of the chest also confirmed pneumonia.  Patient has received IV fluids and initial Rocephin and Zithromax for community-acquired pneumonia and is being admitted to the medical service.    Assessment/ Plan:  Sepsis/ R upper/lower lobe pneumonia: Patient presenting with chest discomfort, shortness of breath found to have a right upper/lower lobe multifocal pneumonia on CT scan.  Was febrile to 102.1 with a WBC count of 20.6.  Lactic acid elevated at 3.1.  Patient was started on antibiotics with ceftriaxone and azithromycin. --WBC count improving,  20.6-->17.0-->8.5 -- patient feeling sig better daily --still spiking fevers this am to 101F tho --Blood cultures x2: No growth --Respiratory viral panel: Negative --Sputum culture: Pending --Continue antibiotics with ceftriaxone and azithromycin --Once afebrile greater than 24 hours, plan transition to oral antibiotics and discharge home  Hypokalemia: Resolved Potassium 4.0 this morning.   --Recheck electrolytes in the a.m. to include magnesium.  Elevated troponin Troponin elevated at 0.54 on admission.  Initial EKG showing sinus tachycardia without any concerning ST elevation/depression or concerning T wave inversions.  Normal intervals.  Now in normal sinus rhythm on telemetry.  Reviewed telemetry without any concerning events overnight.  No continued chest pain.  Suspect elevated troponin in the setting of demand ischemia from sepsis as above.  No further evaluation required at this time.  Hypothyroidism TSH 1.038 --Continue levothyroxine 88 mcg p.o. daily  Essential hypertension History of paroxysmal atrial fibrillation Currently in normal sinus rhythm on telemetry.  On admission was in sinus tachycardia.  No concerning events on telemetry noted on telemetry, now discontinued. --Continue metoprolol succinate 25 mg p.o. BID  Weakness/deconditioning: --PT evaluated and pt OK for dc home    DVT prophylaxis: Heparin Code Status: Full code Family Communication: None Disposition Plan: possible dc home in 1-2 days, when afebrile > 24h  Antimicrobials:  Azithromycin 2/23>>  Ceftriaxone 2/23>>   Subjective: Patient seen and examined, resting comfortably in bed.  Had fevers again early this am, but coughing is better and overall feeling much better.  Eating some, no SOB or chills.  Daughter is in the room.   Objective: Vitals:   08/19/18 1402 08/19/18 1404 08/19/18  1406 08/19/18 1406  BP: (!) 158/89 (!) 151/86 (!) 153/80 (!) 153/91  Pulse: 91 90 93 93  Resp: 17 20  (!) 22    Temp: (!) 101.1 F (38.4 C) 100 F (37.8 C) 100.1 F (37.8 C) 100.1 F (37.8 C)  TempSrc: Oral Oral Oral Oral  SpO2: 100% 100% 98% 98%  Weight:      Height:        Intake/Output Summary (Last 24 hours) at 08/19/2018 1807 Last data filed at 08/19/2018 1406 Gross per 24 hour  Intake 360 ml  Output -  Net 360 ml   Filed Weights   08/16/18 1618  Weight: 50.6 kg    Examination:  General exam: Appears calm and comfortable  Respiratory system: Breath sounds slightly decreased bilateral bases, slight crackles right base, otherwise clear. Respiratory effort normal, oxygenating well on room air. Cardiovascular system: S1 & S2 heard, RRR. No JVD, murmurs, rubs, gallops or clicks. No pedal edema. Gastrointestinal system: Abdomen is nondistended, soft and nontender. No organomegaly or masses felt. Normal bowel sounds heard. Central nervous system: Alert and oriented. No focal neurological deficits. Extremities: Symmetric 5 x 5 power. Skin: No rashes, lesions or ulcers Psychiatry: Judgement and insight appear normal. Mood & affect appropriate.     Data Reviewed: I have personally reviewed following labs and imaging studies  CBC: Recent Labs  Lab 08/16/18 1718 08/17/18 0353 08/18/18 0615 08/19/18 0647  WBC 20.6* 17.0* 8.5 8.9  HGB 13.6 11.2* 9.5* 10.8*  HCT 42.9 36.3 30.8* 35.7*  MCV 91.3 93.6 93.6 94.2  PLT 291 249 239 732   Basic Metabolic Panel: Recent Labs  Lab 08/16/18 1718 08/17/18 0353 08/18/18 0615 08/19/18 0647  NA 136 136 139 139  K 3.8 3.0* 4.0 4.4  CL 99 102 113* 109  CO2 26 24 21* 24  GLUCOSE 101* 114* 89 96  BUN 23 23 13 8   CREATININE 1.59* 1.48* 1.24* 1.20*  CALCIUM 9.1 8.2* 7.8* 8.7*  MG  --   --  1.8  --    GFR: Estimated Creatinine Clearance: 30.4 mL/min (A) (by C-G formula based on SCr of 1.2 mg/dL (H)). Liver Function Tests: Recent Labs  Lab 08/16/18 1813 08/17/18 0353  AST 36 21  ALT 20 16  ALKPHOS 142* 100  BILITOT 1.9* 0.8  PROT  7.7 5.6*  ALBUMIN 3.7 2.5*   Recent Labs  Lab 08/16/18 1813  LIPASE 39   No results for input(s): AMMONIA in the last 168 hours. Coagulation Profile: No results for input(s): INR, PROTIME in the last 168 hours. Cardiac Enzymes: No results for input(s): CKTOTAL, CKMB, CKMBINDEX, TROPONINI in the last 168 hours. BNP (last 3 results) No results for input(s): PROBNP in the last 8760 hours. HbA1C: No results for input(s): HGBA1C in the last 72 hours. CBG: No results for input(s): GLUCAP in the last 168 hours. Lipid Profile: No results for input(s): CHOL, HDL, LDLCALC, TRIG, CHOLHDL, LDLDIRECT in the last 72 hours. Thyroid Function Tests: Recent Labs    08/17/18 0846  TSH 1.038   Anemia Panel: No results for input(s): VITAMINB12, FOLATE, FERRITIN, TIBC, IRON, RETICCTPCT in the last 72 hours. Sepsis Labs: Recent Labs  Lab 08/16/18 1814 08/16/18 2208  LATICACIDVEN 3.1* 3.2*    Recent Results (from the past 240 hour(s))  Blood culture (routine x 2)     Status: None (Preliminary result)   Collection Time: 08/16/18  6:25 PM  Result Value Ref Range Status   Specimen Description  Final    BLOOD LEFT ANTECUBITAL Performed at [facility] [address], [place], [place] [address]    Special Requests   Final    BOTTLES DRAWN AEROBIC AND ANAEROBIC Blood Culture adequate volume Performed at [place] [address], [place], [place] [address]    Culture   Final    NO GROWTH 3 DAYS Performed at [facility], [place] [address], [place], [place] [address]    Report Status PENDING  Incomplete  Blood culture (routine x 2)     Status: None (Preliminary result)   Collection Time: 08/16/18  6:25 PM  Result Value Ref Range Status   Specimen Description BLOOD LEFT FOREARM  Final   Special Requests   Final    BOTTLES DRAWN AEROBIC AND ANAEROBIC Blood Culture results may not be optimal due to an excessive volume of blood received in culture  bottles Performed at [facility], [place] [address] [place], [place] [address]    Culture   Final    NO GROWTH 3 DAYS Performed at [facility], [place] [address], [place], [place] [address]    Report Status PENDING  Incomplete  Respiratory Panel by PCR     Status: None   Collection Time: 08/16/18 11:10 PM  Result Value Ref Range Status   Adenovirus NOT DETECTED NOT DETECTED Final   Coronavirus 229E NOT DETECTED NOT DETECTED Final    Comment: (NOTE) The Coronavirus on the Respiratory Panel, DOES NOT test for the novel  Coronavirus (2019 nCoV)    Coronavirus HKU1 NOT DETECTED NOT DETECTED Final   Coronavirus NL63 NOT DETECTED NOT DETECTED Final   Coronavirus OC43 NOT DETECTED NOT DETECTED Final   Metapneumovirus NOT DETECTED NOT DETECTED Final   Rhinovirus / Enterovirus NOT DETECTED NOT DETECTED Final   Influenza A NOT DETECTED NOT DETECTED Final   Influenza B NOT DETECTED NOT DETECTED Final   Parainfluenza Virus 1 NOT DETECTED NOT DETECTED Final   Parainfluenza Virus 2 NOT DETECTED NOT DETECTED Final   Parainfluenza Virus 3 NOT DETECTED NOT DETECTED Final   Parainfluenza Virus 4 NOT DETECTED NOT DETECTED Final   Respiratory Syncytial Virus NOT DETECTED NOT DETECTED Final   Bordetella pertussis NOT DETECTED NOT DETECTED Final   Chlamydophila pneumoniae NOT DETECTED NOT DETECTED Final   Mycoplasma pneumoniae NOT DETECTED NOT DETECTED Final    Comment: Performed at [facility], [address] [place], [place] [address]  Urine culture     Status: None   Collection Time: 08/17/18  5:11 AM  Result Value Ref Range Status   Specimen Description   Final    URINE, CLEAN CATCH Performed at [facility], [place] [address], [place], [place] [address]    Special Requests   Final    NONE Performed at [facility], [place] [address], [place], [place] [address]    Culture   Final    NO GROWTH Performed at [place], [place] [address], [place], [place] [address]    Report Status 08/18/2018 FINAL  Final  Culture, sputum-assessment     Status: None   Collection Time: 08/17/18  4:30 PM  Result Value Ref Range Status   Specimen Description SPU EXPECTORATED  Final   Special Requests NONE  Final   Sputum evaluation   Final    THIS SPECIMEN IS ACCEPTABLE FOR SPUTUM CULTURE Performed at [facility], [place]  [address] [place], [place] [address]    Report Status 08/17/2018 FINAL  Final  Culture, respiratory     Status: None (Preliminary result)   Collection Time: 08/17/18  4:30 PM  Result Value Ref Range Status   Specimen Description   Final    SPU EXPECTORATED Performed at [place] [address], [place], [place] [address]    Special Requests   Final    NONE Reflexed from [id number] Performed at [place] [address], [place], [place] [address]    Gram Stain   Final    FEW WBC PRESENT, PREDOMINANTLY PMN FEW GRAM POSITIVE COCCI IN PAIRS IN CLUSTERS RARE YEAST    Culture   Final    FEW STAPHYLOCOCCUS AUREUS SUSCEPTIBILITIES TO FOLLOW Performed at [facility], [place] [address], [place], [place] [address]    Report Status PENDING  Incomplete  MRSA PCR Screening     Status: Abnormal   Collection Time: 08/18/18  2:55 PM  Result Value Ref Range Status   MRSA by PCR POSITIVE (A) NEGATIVE Final    Comment:        The GeneXpert MRSA Assay (FDA approved for NASAL specimens only), is one component of a comprehensive MRSA colonization surveillance program. It is not intended to diagnose MRSA infection nor to guide or monitor treatment for MRSA infections. RESULT CALLED TO, READ BACK BY AND VERIFIED WITH: Q.CORE AT 2206 ON 08/18/18 BY N.THOMPSON Performed at [facility], [place] [address] [place], [place] [address]          Radiology Studies: No results found.      Scheduled Meds: . acidophilus   Oral  QPC breakfast  . azithromycin  500 mg Oral q1800  . B-complex with vitamin C  1 tablet Oral QPC supper  . Chlorhexidine Gluconate Cloth  6 each Topical Q0600  . heparin  5,000 Units Subcutaneous Q8H  . levothyroxine  88 mcg Oral QAC breakfast  . LORazepam  0.5 mg Oral QHS  . meclizine  12.5-25 mg Oral QHS  . metoprolol succinate  25 mg Oral BID PC  . multivitamin with minerals   Oral QPC supper  . mupirocin ointment  1 application Nasal BID  . polycarbophil   Oral Daily  . senna  1 tablet Oral Daily   Continuous Infusions: . sodium chloride 75 mL/hr at 08/19/18 1226  . cefTRIAXone (ROCEPHIN)  IV 1 g (08/18/18 1734)     LOS: 3 days    If 7PM-7AM, please contact night-coverage [email or web address] Password [facility] 08/19/2018, 6:07 PM

## 2018-08-19 NOTE — Progress Notes (Signed)
PHARMACIST - PHYSICIAN COMMUNICATION DR:   Jonnie Finner CONCERNING: Antibiotic IV to Oral Route Change Policy  RECOMMENDATION: This patient is receiving zithromax by the intravenous route.  Based on criteria approved by the Pharmacy and Therapeutics Committee, the antibiotic(s) is/are being converted to the equivalent oral dose form(s).   DESCRIPTION: These criteria include:  Patient being treated for a respiratory tract infection, urinary tract infection, cellulitis or clostridium difficile associated diarrhea if on metronidazole  The patient is not neutropenic and does not exhibit a GI malabsorption state  The patient is eating (either orally or via tube) and/or has been taking other orally administered medications for a least 24 hours  The patient is improving clinically and has a Tmax < 100.5  If you have questions about this conversion, please contact the Pharmacy Department  []   [phone] )  Deborah Shields []   [phone] )  [facility] []   [phone] )  Deborah Shields []   [phone] )  [facility] [x]   [phone] )  [place], PharmD, BCPS 08/19/2018 10:23 AM

## 2018-08-19 NOTE — Evaluation (Addendum)
Physical Therapy Evaluation Patient Details Name: Deborah Shields MRN: [id number] DOB: Sep 28, 1940 Today's Date: 08/19/2018   History of Present Illness  78 yo female admitted with sepsis, chest pain, dyspnea. Hx of CAD, CKD, anxiety, DVT, hypothyroidism, PVD, OA.   Clinical Impression  On eval, pt required Min assist for mobility. She walked ~75 feet with IV pole for support. Pt presents with general weakness, decreased activity tolerance, and impaired gait and balance. O2 sat 98% on RA, dyspnea 2/4 with activity. Discussed d/c plan-pt prefers to return home. She has several family members that live close by. Will recommend HHPT f/u as long as family can assist as needed. Instructed pt to use a RW for ambulation safety until she returns to baseline. Pt could benefit from the [facility] if deemed a candidate-will need CM to follow up. Will follow during hospital stay.     Follow Up Recommendations Home health PT;Supervision - Intermittent    Equipment Recommendations  None recommended by PT(pt stated she has access to a RW and cane at home)    Recommendations for Other Services       Precautions / Restrictions Precautions Precautions: Fall Restrictions Weight Bearing Restrictions: No      Mobility  Bed Mobility Overal bed mobility: Needs Assistance Bed Mobility: Supine to Sit     Supine to sit: Min guard;HOB elevated     General bed mobility comments: close guard for safety. Increased time.   Transfers Overall transfer level: Needs assistance Equipment used: None Transfers: Sit to/from Stand Sit to Stand: Min assist         General transfer comment: Assist to rise, stabilize, control descent. VCs safety, hand placement. Stand pivot, bed to bsc.   Ambulation/Gait Ambulation/Gait assistance: Min assist Gait Distance (Feet): 75 Feet Assistive device: IV Pole Gait Pattern/deviations: Step-through pattern;Decreased stride length     General Gait Details:  Slow gait speed. Unsteady. O2 sat 98% on RA. Dyspnea 2/4.   Stairs            Wheelchair Mobility    Modified Rankin (Stroke Patients Only)       Balance Overall balance assessment: Needs assistance         Standing balance support: Bilateral upper extremity supported Standing balance-Leahy Scale: Poor                               Pertinent Vitals/Pain Pain Assessment: No/denies pain    Home Living Family/patient expects to be discharged to:: Private residence Living Arrangements: Alone Available Help at Discharge: Family;Available PRN/intermittently Type of Home: House         Home Equipment: Walker - 2 wheels;Cane - single point      Prior Function Level of Independence: Independent               Hand Dominance        Extremity/Trunk Assessment   Upper Extremity Assessment Upper Extremity Assessment: Defer to OT evaluation    Lower Extremity Assessment Lower Extremity Assessment: Generalized weakness    Cervical / Trunk Assessment Cervical / Trunk Assessment: Kyphotic  Communication   Communication: No difficulties  Cognition Arousal/Alertness: Awake/alert Behavior During Therapy: WFL for tasks assessed/performed Overall Cognitive Status: Within Functional Limits for tasks assessed  General Comments      Exercises     Assessment/Plan    PT Assessment Patient needs continued PT services  PT Problem List Decreased strength;Decreased balance;Decreased mobility;Decreased knowledge of use of DME       PT Treatment Interventions DME instruction;Gait training;Functional mobility training;Therapeutic activities;Balance training;Patient/family education;Therapeutic exercise    PT Goals (Current goals can be found in the Care Plan section)  Acute Rehab PT Goals Patient Stated Goal: home. regain PLOF. PT Goal Formulation: With patient/family Time For Goal  Achievement: 09/02/18 Potential to Achieve Goals: Good    Frequency Min 3X/week   Barriers to discharge        Co-evaluation               AM-PAC PT "6 Clicks" Mobility  Outcome Measure Help needed turning from your back to your side while in a flat bed without using bedrails?: A Little Help needed moving from lying on your back to sitting on the side of a flat bed without using bedrails?: A Little Help needed moving to and from a bed to a chair (including a wheelchair)?: A Little Help needed standing up from a chair using your arms (e.g., wheelchair or bedside chair)?: A Little Help needed to walk in hospital room?: A Little Help needed climbing 3-5 steps with a railing? : A Little 6 Click Score: 18    End of Session Equipment Utilized During Treatment: Gait belt Activity Tolerance: Patient tolerated treatment well Patient left: in bed;with call bell/phone within reach;with bed alarm set;with family/visitor present   PT Visit Diagnosis: Unsteadiness on feet (R26.81);Muscle weakness (generalized) (M62.81);Difficulty in walking, not elsewhere classified (R26.2)    Time: 4665-9935 PT Time Calculation (min) (ACUTE ONLY): 16 min   Charges:   PT Evaluation $PT Eval Moderate Complexity: [place], PT Acute Rehabilitation Services Pager: [phone] Office: [phone]

## 2018-08-20 DIAGNOSIS — J15212 Pneumonia due to Methicillin resistant Staphylococcus aureus: Secondary | ICD-10-CM

## 2018-08-20 LAB — CBC
HCT: 35.3 % — ABNORMAL LOW (ref 36.0–46.0)
Hemoglobin: 11 g/dL — ABNORMAL LOW (ref 12.0–15.0)
MCH: 29.1 pg (ref 26.0–34.0)
MCHC: 31.2 g/dL (ref 30.0–36.0)
MCV: 93.4 fL (ref 80.0–100.0)
Platelets: 278 10*3/uL (ref 150–400)
RBC: 3.78 MIL/uL — ABNORMAL LOW (ref 3.87–5.11)
RDW: 14.9 % (ref 11.5–15.5)
WBC: 8.3 10*3/uL (ref 4.0–10.5)
nRBC: 0 % (ref 0.0–0.2)

## 2018-08-20 LAB — CULTURE, RESPIRATORY W GRAM STAIN

## 2018-08-20 LAB — BASIC METABOLIC PANEL
Anion gap: 6 (ref 5–15)
BUN: 8 mg/dL (ref 8–23)
CO2: 26 mmol/L (ref 22–32)
Calcium: 8.8 mg/dL — ABNORMAL LOW (ref 8.9–10.3)
Chloride: 109 mmol/L (ref 98–111)
Creatinine, Ser: 1.29 mg/dL — ABNORMAL HIGH (ref 0.44–1.00)
GFR calc Af Amer: 46 mL/min — ABNORMAL LOW (ref >60)
GFR calc non Af Amer: 39 mL/min — ABNORMAL LOW (ref >60)
Glucose, Bld: 98 mg/dL (ref 70–99)
Potassium: 4.1 mmol/L (ref 3.5–5.1)
Sodium: 141 mmol/L (ref 135–145)

## 2018-08-20 MED ORDER — VANCOMYCIN HCL 500 MG IV SOLR
500.0000 mg | INTRAVENOUS | Status: DC
  Filled 2018-08-20: qty 500

## 2018-08-20 MED ORDER — VANCOMYCIN HCL IN DEXTROSE 1-5 GM/200ML-% IV SOLN
1000.0000 mg | Freq: Once | INTRAVENOUS | Status: AC
  Administered 2018-08-20: 1000 mg via INTRAVENOUS
  Filled 2018-08-20: qty 200

## 2018-08-20 MED ORDER — PHENOL 1.4 % MT LIQD
1.0000 | OROMUCOSAL | Status: DC | PRN
  Filled 2018-08-20: qty 177

## 2018-08-20 NOTE — Progress Notes (Signed)
PT Cancellation Note  Patient Details Name: Deborah Shields MRN: [id number] DOB: 08-16-1940   Cancelled Treatment:    Reason Eval/Treat Not Completed: Other (comment); pt family refused stating that pt was "very tired" continue efforts.   [facility] 08/20/2018, 3:41 PM

## 2018-08-20 NOTE — Progress Notes (Signed)
Pharmacy Antibiotic Note  Deborah Shields is a 78 y.o. female admitted on 08/16/2018 with pneumonia.  Pharmacy has been consulted for vancomycin dosing.  Plan: Vancomycin 1000 mg IV x , then 500 mg IV q24h  Monitor clinical course, renal function, cultures as available   Height: 5\' 7"  (170.2 cm) Weight: 111 lb 8 oz (50.6 kg) IBW/kg (Calculated) : 61.6  Temp (24hrs), Avg:99.6 F (37.6 C), Min:98 F (36.7 C), Max:101.1 F (38.4 C)  Recent Labs  Lab 08/16/18 1718 08/16/18 1814 08/16/18 2208 08/17/18 0353 08/18/18 0615 08/19/18 0647 08/20/18 0540  WBC 20.6*  --   --  17.0* 8.5 8.9 8.3  CREATININE 1.59*  --   --  1.48* 1.24* 1.20* 1.29*  LATICACIDVEN  --  3.1* 3.2*  --   --   --   --     Estimated Creatinine Clearance: 28.2 mL/min (A) (by C-G formula based on SCr of 1.29 mg/dL (H)).    Allergies  Allergen Reactions  . Aspirin Nausea Only and Other (See Comments)    REACTION: GI upset  ( pt can take 81 mg but NOT   325 mg ASA )  . Lovastatin Nausea Only and Other (See Comments)    REACTION: nausea  . Benazepril Hcl Other (See Comments)    Unknown  . Paroxetine Other (See Comments)    Unknown  . Bupropion Hcl Other (See Comments)    REACTION: tinnitis  . Ezetimibe Nausea And Vomiting and Other (See Comments)    REACTION: GI symptoms  . Fenofibrate Other (See Comments)    Myalgia   . Pravastatin Sodium Other (See Comments)    REACTION: elevated CPK - Muscle's in bilateral Leg    Antimicrobials this admission: 2/24 ceftriaxone >> 2/27 2/24 azithromycin >> 2/27 2/27 vancomycin >>   Dose adjustments this admission:   Microbiology results: 2/23 BCx: NGTD 2/23 Resp panel PCR: negative 2/24 UCx: NGF  2/24 Sputum: few MRSA ( R to erythromycin, oxacillin), rare yeast   2/25 MRSA PCR: positive  Thank you for allowing pharmacy to be a part of this patient's care.   Royetta Asal, PharmD, BCPS Pager [phone] 08/20/2018 12:17 PM

## 2018-08-20 NOTE — Progress Notes (Addendum)
PROGRESS NOTE    Deborah Shields  TIR:[id number] DOB: [birth year] DOA: 08/16/2018 PCP: Lajean Manes, MD   Chief complaint: Chest pain, shortness of breath, abdominal pain  Brief Narrative:   Deborah Shields is a 78 y.o. female with medical history significant of history of coronary artery disease, chronic kidney disease stage III, anxiety disorder, previous DVTs, hypothyroidism and peripheral vascular disease who was brought in with family complaining of chest pain shortness of breath and cough.  Chest pain is pleuritic in nature.  Associated with some tachycardia.  Also low-grade fever. Patient was seen and evaluated and found to have what appears to be pneumonia on chest x-ray.  Subsequent CT angiogram of the chest revealed no PE.  She is septic with elevated lactic acid.  She has had productive cough.  Denied any hemoptysis.  Denied any nausea or vomiting.  She has generalized malaise.  Patient subsequently admitted with a diagnosis of sepsis due to pneumonia. ED Course: Temperature is 102.1 with blood pressure 135/92 pulse 112 respiratory 22 oxygen sat 97% on 2 L.  White count is 20,000.  With normal hemoglobin and platelets.  Chemistry appears within normal except for creatinine of 1.59 which is at baseline.  Lactic acid of 3.1.  Chest x-ray showed patchy airspace opacity in the right lung suggesting pneumonia.  Subsequent CT angiogram of the chest also confirmed pneumonia.  Patient has received IV fluids and initial Rocephin and Zithromax for community-acquired pneumonia and is being admitted to the medical service.    Assessment/ Plan:  Sepsis/ R MRSA+ pneumonia: Patient presenting with chest discomfort, shortness of breath found to have a right upper/lower lobe multifocal pneumonia on CT scan.  Was febrile to 102.1 with a WBC count of 20.6.  Lactic acid elevated at 3.1.  Patient was started on antibiotics with ceftriaxone and azithromycin. --Blood cultures x2: No  growth --Respiratory viral panel: Negative --WBC count improving, 20.6-->17.0-->8.5 but still spiking temps as of 2/26 and on 2/27 pt's sputum Cx came back +MRSA. Perhaps this is why she is not recovering as much -- w/ pharm assist we have switched to IV vanc today and dc rocephin/ azithro.   -- ok for DC when afeb x 24 hrs -- not sure yet whether will be going home w/ HHPT or to SNF (daughter has a bad back and is of little help moving pt around at home)  Hypokalemia: Resolved Potassium 4.0 this morning.   --Recheck electrolytes in the a.m. to include magnesium.  Elevated troponin Troponin elevated at 0.54 on admission.  Initial EKG showing sinus tachycardia without any concerning ST elevation/depression or concerning T wave inversions.  Normal intervals.  Now in normal sinus rhythm on telemetry.  Reviewed telemetry without any concerning events overnight.  No continued chest pain.  Suspect elevated troponin in the setting of demand ischemia from sepsis as above.  No further evaluation required at this time.  CKD stage 3: pt's creat is stable 1.2- 1.4 here , baseline 1.2- 1.6. eGFR around 40 ml/min.   Hypothyroidism TSH 1.038 --Continue levothyroxine 88 mcg p.o. daily  Essential hypertension History of paroxysmal atrial fibrillation Currently in normal sinus rhythm on telemetry.  On admission was in sinus tachycardia.  No concerning events on telemetry noted on telemetry, now discontinued. --Continue metoprolol succinate 25 mg p.o. BID  Weakness/deconditioning: --PT evaluated and pt OK for dc home   MRSA+ nasal swab: protocol initiated for CHG wipes and bactroban nasal ointment    DVT prophylaxis: pt doesn't  like hep shots will change to daily lovenox Code Status: Full code Family Communication: None Disposition Plan: dc when afebrile > 24h, not sure SNF vs home w/ home PT yet  Kelly Splinter / [facility] [phone] 08/20/2018, 4:12 PM   Antimicrobials:  Azithromycin 2/23 >>  2/27  Ceftriaxone  2/23 >> 2/27  IV Vanc 2/27 >>    Subjective: Patient seen and examined, still having fevers, yest to 101.5 in the afternoon. Cough is better, still very "weak". Sputum Cx today returned + for mrsa.    Objective: Vitals:   08/19/18 1406 08/19/18 2004 08/20/18 0616 08/20/18 1429  BP: (!) 153/91 (!) 150/87 134/74 140/82  Pulse: 93 81 71 83  Resp: (!) 22 20 17 16   Temp: 100.1 F (37.8 C) 98.4 F (36.9 C) 98 F (36.7 C) 98.2 F (36.8 C)  TempSrc: Oral Oral    SpO2: 98% 100% 97% 97%  Weight:      Height:        Intake/Output Summary (Last 24 hours) at 08/20/2018 1602 Last data filed at 08/20/2018 0400 Gross per 24 hour  Intake 1220.8 ml  Output -  Net 1220.8 ml   Filed Weights   08/16/18 1618  Weight: 50.6 kg    Examination:  General exam: Appears calm and comfortable  Respiratory system: Breath sounds clear bilat, no sig rales/ wheezing Cardiovascular system: S1 & S2 heard, RRR. No JVD, murmurs, rubs, gallops or clicks. No pedal edema. Gastrointestinal system: Abdomen is nondistended, soft and nontender. No organomegaly or masses felt. Normal bowel sounds heard. Central nervous system: Alert and oriented. No focal neurological deficits. Extremities: Symmetric 5 x 5 power. Skin: No rashes, lesions or ulcers Psychiatry: Judgement and insight appear normal. Mood & affect appropriate.     Data Reviewed: I have personally reviewed following labs and imaging studies  CBC: Recent Labs  Lab 08/16/18 1718 08/17/18 0353 08/18/18 0615 08/19/18 0647 08/20/18 0540  WBC 20.6* 17.0* 8.5 8.9 8.3  HGB 13.6 11.2* 9.5* 10.8* 11.0*  HCT 42.9 36.3 30.8* 35.7* 35.3*  MCV 91.3 93.6 93.6 94.2 93.4  PLT 291 249 239 295 940   Basic Metabolic Panel: Recent Labs  Lab 08/16/18 1718 08/17/18 0353 08/18/18 0615 08/19/18 0647 08/20/18 0540  NA 136 136 139 139 141  K 3.8 3.0* 4.0 4.4 4.1  CL 99 102 113* 109 109  CO2 26 24 21* 24 26  GLUCOSE 101* 114* 89 96 98   BUN 23 23 13 8 8   CREATININE 1.59* 1.48* 1.24* 1.20* 1.29*  CALCIUM 9.1 8.2* 7.8* 8.7* 8.8*  MG  --   --  1.8  --   --    GFR: Estimated Creatinine Clearance: 28.2 mL/min (A) (by C-G formula based on SCr of 1.29 mg/dL (H)). Liver Function Tests: Recent Labs  Lab 08/16/18 1813 08/17/18 0353  AST 36 21  ALT 20 16  ALKPHOS 142* 100  BILITOT 1.9* 0.8  PROT 7.7 5.6*  ALBUMIN 3.7 2.5*   Recent Labs  Lab 08/16/18 1813  LIPASE 39   No results for input(s): AMMONIA in the last 168 hours. Coagulation Profile: No results for input(s): INR, PROTIME in the last 168 hours. Cardiac Enzymes: No results for input(s): CKTOTAL, CKMB, CKMBINDEX, TROPONINI in the last 168 hours. BNP (last 3 results) No results for input(s): PROBNP in the last 8760 hours. HbA1C: No results for input(s): HGBA1C in the last 72 hours. CBG: No results for input(s): GLUCAP in the last 168 hours. Lipid Profile:  No results for input(s): CHOL, HDL, LDLCALC, TRIG, CHOLHDL, LDLDIRECT in the last 72 hours. Thyroid Function Tests: No results for input(s): TSH, T4TOTAL, FREET4, T3FREE, THYROIDAB in the last 72 hours. Anemia Panel: No results for input(s): VITAMINB12, FOLATE, FERRITIN, TIBC, IRON, RETICCTPCT in the last 72 hours. Sepsis Labs: Recent Labs  Lab 08/16/18 1814 08/16/18 2208  LATICACIDVEN 3.1* 3.2*    Recent Results (from the past 240 hour(s))  Blood culture (routine x 2)     Status: None (Preliminary result)   Collection Time: 08/16/18  6:25 PM  Result Value Ref Range Status   Specimen Description   Final    BLOOD LEFT ANTECUBITAL Performed at [facility], [place] [address] [place], [place] [address]    Special Requests   Final    BOTTLES DRAWN AEROBIC AND ANAEROBIC Blood Culture adequate volume Performed at [place] [address], [place], [place] [address]    Culture   Final    NO GROWTH 4 DAYS Performed at [facility], [place] [address] [place], [place] [address]    Report Status PENDING  Incomplete  Blood culture (routine x 2)     Status: None (Preliminary result)   Collection Time: 08/16/18  6:25 PM  Result Value Ref Range Status   Specimen Description BLOOD LEFT FOREARM  Final   Special Requests   Final    BOTTLES DRAWN AEROBIC AND ANAEROBIC Blood Culture results may not be optimal due to an excessive volume of blood received in culture bottles Performed at [facility], [place] [address] [place], [place] [address]    Culture   Final    NO GROWTH 4 DAYS Performed at [facility], [place] [address], [place], [place] [address]    Report Status PENDING  Incomplete  Respiratory Panel by PCR     Status: None   Collection Time: 08/16/18 11:10 PM  Result Value Ref Range Status   Adenovirus NOT DETECTED NOT DETECTED Final   Coronavirus 229E NOT DETECTED NOT DETECTED Final    Comment: (NOTE) The Coronavirus on the Respiratory Panel, DOES NOT test for the novel  Coronavirus (2019 nCoV)    Coronavirus HKU1 NOT DETECTED NOT DETECTED Final   Coronavirus NL63 NOT DETECTED NOT DETECTED Final   Coronavirus OC43 NOT DETECTED NOT DETECTED Final   Metapneumovirus NOT DETECTED NOT DETECTED Final   Rhinovirus / Enterovirus NOT DETECTED NOT DETECTED Final   Influenza A NOT DETECTED NOT DETECTED Final   Influenza B NOT DETECTED NOT DETECTED Final   Parainfluenza Virus 1 NOT DETECTED NOT DETECTED Final   Parainfluenza Virus 2 NOT DETECTED NOT DETECTED Final   Parainfluenza Virus 3 NOT DETECTED NOT DETECTED Final   Parainfluenza Virus 4 NOT DETECTED NOT DETECTED Final   Respiratory Syncytial Virus NOT DETECTED NOT DETECTED Final   Bordetella pertussis NOT DETECTED NOT DETECTED Final   Chlamydophila pneumoniae NOT DETECTED NOT DETECTED Final   Mycoplasma pneumoniae NOT DETECTED NOT DETECTED Final    Comment: Performed at [facility], [address] [place], [place] [address]  Urine culture     Status:  None   Collection Time: 08/17/18  5:11 AM  Result Value Ref Range Status   Specimen Description   Final    URINE, CLEAN CATCH Performed at [facility], [place] [address], [place], [place] [address]    Special Requests   Final    NONE Performed at [facility], [place] [address], [place], [place] [address]    Culture   Final    NO GROWTH Performed at [facility], [place] [address], [place], [place] [address]    Report Status 08/18/2018 FINAL  Final  Culture, sputum-assessment     Status: None   Collection Time: 08/17/18  4:30 PM  Result Value Ref Range Status   Specimen Description SPU EXPECTORATED  Final   Special Requests NONE  Final   Sputum evaluation   Final    THIS SPECIMEN IS ACCEPTABLE FOR SPUTUM CULTURE Performed at [facility], [place] [address] [place], [place] [address]    Report Status 08/17/2018 FINAL  Final  Culture, respiratory     Status: None   Collection Time: 08/17/18  4:30 PM  Result Value Ref Range Status   Specimen Description   Final    SPU EXPECTORATED Performed at [place] [address] [place], [place] [address]    Special Requests   Final    NONE Reflexed from [id number] Performed at [place] [address], [place], [place] [address]    [place] Stain   Final    FEW WBC PRESENT, PREDOMINANTLY PMN FEW GRAM POSITIVE COCCI IN PAIRS IN CLUSTERS RARE YEAST Performed at [facility], [place] [address] [place],  [address]    Culture   Final    FEW METHICILLIN RESISTANT STAPHYLOCOCCUS AUREUS FEW CANDIDA ALBICANS    Report Status 08/20/2018 FINAL  Final   Organism ID, Bacteria METHICILLIN RESISTANT STAPHYLOCOCCUS AUREUS  Final      Susceptibility   Methicillin resistant staphylococcus aureus - MIC*    CIPROFLOXACIN <=0.5 SENSITIVE Sensitive     ERYTHROMYCIN >=8 RESISTANT Resistant     GENTAMICIN <=0.5 SENSITIVE Sensitive     OXACILLIN >=4  RESISTANT Resistant     TETRACYCLINE <=1 SENSITIVE Sensitive     VANCOMYCIN 1 SENSITIVE Sensitive     TRIMETH/SULFA <=10 SENSITIVE Sensitive     CLINDAMYCIN <=0.25 SENSITIVE Sensitive     RIFAMPIN <=0.5 SENSITIVE Sensitive     Inducible Clindamycin NEGATIVE Sensitive     * FEW METHICILLIN RESISTANT STAPHYLOCOCCUS AUREUS  MRSA PCR Screening     Status: Abnormal   Collection Time: 08/18/18  2:55 PM  Result Value Ref Range Status   MRSA by PCR POSITIVE (A) NEGATIVE Final    Comment:        The GeneXpert MRSA Assay (FDA approved for NASAL specimens only), is one component of a comprehensive MRSA colonization surveillance program. It is not intended to diagnose MRSA infection nor to guide or monitor treatment for MRSA infections. RESULT CALLED TO, READ BACK BY AND VERIFIED WITH: Q.CORE AT 2206 ON 08/18/18 BY N.THOMPSON Performed at [facility], [place] [address] [place],  [address]          Radiology Studies: No results found.      Scheduled Meds: . acidophilus   Oral QPC breakfast  . B-complex with vitamin C  1 tablet Oral QPC supper  . Chlorhexidine Gluconate Cloth  6 each Topical Q0600  . heparin  5,000 Units Subcutaneous Q8H  . levothyroxine  88 mcg Oral QAC breakfast  . LORazepam  0.5 mg Oral QHS  . meclizine  12.5-25 mg Oral QHS  . metoprolol succinate  25 mg Oral BID PC  . multivitamin with minerals   Oral QPC supper  . mupirocin ointment  1 application Nasal BID  .  polycarbophil   Oral Daily  . senna  1 tablet Oral Daily   Continuous Infusions: . sodium chloride 75 mL/hr at 08/20/18 0243  . [START ON 08/21/2018] vancomycin       LOS: 4 days    If 7PM-7AM, please contact night-coverage [email or web address] Password [email or web address] 08/20/2018, 4:02 PM

## 2018-08-20 NOTE — Progress Notes (Signed)
OT Cancellation Note  Patient Details Name: Deborah Shields MRN: [id number] DOB: [birth year]   Cancelled Treatment:    Reason Eval/Treat Not Completed: Fatigue/lethargy limiting ability to participate.  Pt had a hard night per daughter. Will try to check back this pm  Luna Audia 08/20/2018, 11:41 AM  Lesle Chris, OTR/L Acute Rehabilitation Services [phone] [facility] pager [phone] office 08/20/2018

## 2018-08-21 LAB — CULTURE, BLOOD (ROUTINE X 2)
Culture: NO GROWTH
Culture: NO GROWTH
Special Requests: ADEQUATE

## 2018-08-21 MED ORDER — DOXYCYCLINE HYCLATE 100 MG PO TABS
100.0000 mg | ORAL_TABLET | Freq: Two times a day (BID) | ORAL | Status: DC
  Administered 2018-08-21: 100 mg via ORAL
  Filled 2018-08-21: qty 1

## 2018-08-21 MED ORDER — GUAIFENESIN ER 600 MG PO TB12: 600.0000 mg | ORAL_TABLET | Freq: Two times a day (BID) | ORAL | 0 refills | Status: AC

## 2018-08-21 MED ORDER — DOXYCYCLINE HYCLATE 100 MG PO TABS: 100.0000 mg | ORAL_TABLET | Freq: Two times a day (BID) | ORAL | 0 refills | Status: DC

## 2018-08-21 NOTE — Care Management Note (Signed)
Case Management Note  Patient Details  Name: Ersa Delaney MRN: [id number] Date of Birth: 1941/03/22  Subjective/Objective:                  Discharge planning  Action/Plan: [facility] through [facility] dme 3 in 1  Expected Discharge Date:  08/21/18               Expected Discharge Plan:  [place]  In-House Referral:     Discharge planning Services  CM Consult  Post Acute Care Choice:  Home Health Choice offered to:  Patient  DME Arranged:    DME Agency:     HH Arranged:  PT, Nurse's Aide, Social Work [employer] Agency:     Status of Service:  Completed, signed off  If discussed at [employer], dates discussed:    Additional Comments:  Leeroy Cha, RN 08/21/2018, 10:54 AM

## 2018-08-21 NOTE — Discharge Summary (Signed)
Physician Discharge Summary  Deborah Shields YKD:[id number] DOB: February 05, 1941 DOA: 08/16/2018  PCP: Lajean Manes, MD  Admit date: 08/16/2018 Discharge date: 08/21/2018  Admitted From: Home Disposition:  Home  Discharge Condition:Stable CODE STATUS:FULL Diet recommendation: Heart Healthy  Brief/Interim Summary: Deborah Shields a 78 y.o.femalewith medical history significant ofhistory of coronary artery disease, chronic kidney disease stage III, anxiety disorder, previous DVTs, hypothyroidism and peripheral vascular disease who was brought in with family complaining of chest pain shortness of breath and cough. Chest pain is pleuritic in nature. Associated with some tachycardia. Also low-grade fever. Patient was seen and evaluated and found to have what appears to be pneumonia on chest x-ray. Subsequent CT angiogram of the chest revealed no PE. She is septic with elevated lactic acid. She has had productive cough. Denied any hemoptysis. Denied any nausea or vomiting. She has generalized malaise. Patient subsequently admitted with a diagnosis of sepsis due to pneumonia.Patient has received IV fluids and initial Rocephin and Zithromax for community-acquired pneumonia.  Her respiratory status was slow to improve and she remained febrile.  Sputum culture revealed MRSA.  She was a started on IV vancomycin with improvement. This morning she is hemodynamically stable.  Afebrile.  She is stable for discharge to home with doxycycline for 7 more days.  Following problems were addressed during her  Hospitalization:  Sepsis/ R MRSA+ pneumonia: Patient presenting with chest discomfort, shortness of breath found to have a right upper/lower lobe multifocal pneumonia on CT scan.  Was febrile to 102.1 with a WBC count of 20.6.  Lactic acid elevated at 3.1on presentation.  Patient was started on antibiotics with ceftriaxone and azithromycin. Blood cultures x2: No growth Respiratory viral  panel: Negative WBC count improving.She was spiking temp.pt's sputum Cx came back +MRSA. Perhaps this is why she is not recovering as much She was started on vancomycin.  MRSA also susceptible to doxycycline so antibiotics changed today.  Hypokalemia: Resolved  Elevated troponin Troponin elevated at 0.54 on admission.  Initial EKG showing sinus tachycardia without any concerning ST elevation/depression or concerning T wave inversions.  Normal intervals.  Now in normal sinus rhythm on telemetry.  Reviewed telemetry without any concerning events overnight.  No continued chest pain.  Suspect elevated troponin in the setting of demand ischemia from sepsis as above.  No further evaluation required at this time.  CKD stage 3: pt's creat is stable 1.2- 1.4 here , baseline 1.2- 1.6. eGFR around 40 ml/min.   Hypothyroidism TSH 1.038 --Continue levothyroxine 88 mcg p.o. daily  Essential hypertension History of paroxysmal atrial fibrillation Currently in normal sinus rhythm on telemetry.  On admission was in sinus tachycardia.  No concerning events on telemetry noted on telemetry, now discontinued. --Continue metoprolol succinate 25 mg p.o. BID  Weakness/deconditioning: --PT evaluated and pt OK for dc home with HHPT  MRSA+ nasal swab: protocol initiated for CHG wipes and bactroban nasal ointment       Discharge Diagnoses:  Principal Problem:   Sepsis ([place]) Active Problems:   Hypothyroidism   HYPERLIPIDEMIA   Essential hypertension   Chronic renal insufficiency, stage III (moderate) (HCC)   PAF (paroxysmal atrial fibrillation) ([place])   Community acquired pneumonia   Pneumonia due to infectious organism   Pneumonia of right upper lobe due to methicillin resistant Staphylococcus aureus (MRSA) [facility])    Discharge Instructions  Discharge Instructions    Diet - low sodium heart healthy   Complete by:  As directed    Discharge instructions   Complete  by:  As directed    1)Follow  up with your PCP in a week.  Do a CBC, BMP test during the follow-up 2)Take prescribed medications as instructed.   Increase activity slowly   Complete by:  As directed      Allergies as of 08/21/2018      Reactions   Aspirin Nausea Only, Other (See Comments)   REACTION: GI upset  ( pt can take 81 mg but NOT   325 mg ASA )   Lovastatin Nausea Only, Other (See Comments)   REACTION: nausea   Benazepril Hcl Other (See Comments)   Unknown   Paroxetine Other (See Comments)   Unknown   Bupropion Hcl Other (See Comments)   REACTION: tinnitis   Ezetimibe Nausea And Vomiting, Other (See Comments)   REACTION: GI symptoms   Fenofibrate Other (See Comments)   Myalgia   Pravastatin Sodium Other (See Comments)   REACTION: elevated CPK - Muscle's in bilateral Leg      Medication List    TAKE these medications   acetaminophen 325 MG tablet Commonly known as:  TYLENOL Take 325 mg by mouth every 6 (six) hours as needed for fever or headache.   b complex vitamins tablet Take 1 tablet by mouth daily after supper.   CENTRUM SILVER PO Take 1 tablet by mouth daily after supper.   cyclobenzaprine 10 MG tablet Commonly known as:  FLEXERIL Take 10 mg by mouth 2 (two) times daily as needed for muscle spasms (scheduled every every).   diclofenac sodium 1 % Gel Commonly known as:  VOLTAREN Apply 2-4 g topically 4 (four) times daily as needed (for pain).   doxycycline 100 MG tablet Commonly known as:  VIBRA-TABS Take 1 tablet (100 mg total) by mouth every 12 (twelve) hours.   FIBER PO Take 1 tablet by mouth daily.   guaiFENesin 600 MG 12 hr tablet Commonly known as:  MUCINEX Take 1 tablet (600 mg total) by mouth 2 (two) times daily for 7 days.   LORazepam 0.5 MG tablet Commonly known as:  ATIVAN Take 1 tablet (0.5 mg total) by mouth 3 (three) times daily as needed. What changed:  when to take this   meclizine 25 MG tablet Commonly known as:  ANTIVERT Take 1 tablet (25 mg total) by  mouth daily. 1/2 every 8 hrs prn for imbalance What changed:    how much to take  when to take this  additional instructions   metoprolol succinate 50 MG 24 hr tablet Commonly known as:  TOPROL-XL Take 25 mg by mouth 2 (two) times daily after a meal. After breakfast & after supper   PROBIOTIC ADVANCED PO Take 1 capsule by mouth daily after breakfast. VSL #3   senna 8.6 MG tablet Commonly known as:  SENOKOT Take 1 tablet by mouth daily.   SYNTHROID 88 MCG tablet Generic drug:  levothyroxine Take 88 mcg by mouth daily before breakfast. Taking the brand "synthroid"   traMADol 50 MG tablet Commonly known as:  ULTRAM 1/2-1 by mouth once daily as needed. What changed:    how much to take  how to take this  when to take this  reasons to take this  additional instructions      Follow-up Information    Stoneking, Hal, MD. Schedule an appointment as soon as possible for a visit in 1 week(s).   Specialty:  Internal Medicine Contact information: [address] [place] [address] [phone]  Allergies  Allergen Reactions  . Aspirin Nausea Only and Other (See Comments)    REACTION: GI upset  ( pt can take 81 mg but NOT   325 mg ASA )  . Lovastatin Nausea Only and Other (See Comments)    REACTION: nausea  . Benazepril Hcl Other (See Comments)    Unknown  . Paroxetine Other (See Comments)    Unknown  . Bupropion Hcl Other (See Comments)    REACTION: tinnitis  . Ezetimibe Nausea And Vomiting and Other (See Comments)    REACTION: GI symptoms  . Fenofibrate Other (See Comments)    Myalgia   . Pravastatin Sodium Other (See Comments)    REACTION: elevated CPK - Muscle's in bilateral Leg    Consultations:  None   Procedures/Studies: Dg Chest 2 View  Result Date: 08/16/2018 CLINICAL DATA:  Chest pain and shortness of breath EXAM: CHEST - 2 VIEW COMPARISON:  07/15/2017 FINDINGS: Hyperexpansion is consistent with emphysema. Patchy  areas of airspace disease are seen in the right upper and mid lung. Left lung clear. The cardiopericardial silhouette is within normal limits for size. The visualized bony structures of the thorax are intact. IMPRESSION: Patchy airspace opacity in the right lung suggests pneumonia. Follow-up imaging recommended to ensure resolution. Electronically Signed   By: Misty Stanley M.D.   On: 08/16/2018 16:59   Ct Angio Chest Pe W And/or Wo Contrast  Result Date: 08/16/2018 CLINICAL DATA:  Shortness of breath and chest pain. EXAM: CT ANGIOGRAPHY CHEST WITH CONTRAST TECHNIQUE: Multidetector CT imaging of the chest was performed using the standard protocol during bolus administration of intravenous contrast. Multiplanar CT image reconstructions and MIPs were obtained to evaluate the vascular anatomy. CONTRAST:  170m ISOVUE-370 IOPAMIDOL (ISOVUE-370) INJECTION 76% COMPARISON:  07/15/2017 FINDINGS: Cardiovascular: The heart size is normal. No substantial pericardial effusion. Atherosclerotic calcification is noted in the wall of the thoracic aorta. No filling defect in the opacified pulmonary arteries to suggest the presence of an acute pulmonary embolus. Mediastinum/Nodes: No mediastinal lymphadenopathy. 10 mm short axis right hilar lymph node is upper normal for size. No left hilar lymphadenopathy. The esophagus has normal imaging features. There is no axillary lymphadenopathy. Lungs/Pleura: The central tracheobronchial airways are patent. Centrilobular emphsyema noted. Biapical pleuroparenchymal scarring is similar to prior. Areas of architectural distortion and scarring are again noted in the lungs bilaterally. Interstitial and airspace disease in the anterior right upper lobe (39/10) is new in the interval. Right middle lobe volume loss and scarring is not substantially changed since prior. Interstitial and patchy airspace disease in the peripheral right lower lobe (84/10) is new since prior. 11 mm confluent nodular  focus is identified within this airspace disease. Scarring in the anterior right lower lobe (107/10) is stable. Clustered areas of tree-in-bud nodularity in the left lung are similar and compatible with sequelae of atypical infection. No substantial pleural effusion. Upper Abdomen: Probable cyst upper pole left kidney. Musculoskeletal: No worrisome lytic or sclerotic osseous abnormality. Review of the MIP images confirms the above findings. IMPRESSION: 1. No CT evidence for acute pulmonary embolus. 2. Interval development of patchy airspace disease in the anterior right upper lobe and peripheral right lower lobe. Imaging features suggest multifocal pneumonia. Follow-up imaging recommended to ensure resolution. 3. Stable appearance of scattered areas of architectural distortion and scarring in the lungs bilaterally. 4.  Emphysema. (ICD10-J43.9) 5.  Aortic Atherosclerois (ICD10-170.0) Electronically Signed   By: EMisty StanleyM.D.   On: 08/16/2018 20:09  Subjective: Patient seen and examined at the bedside this morning.  Afebrile today.  Hemodynamically stable.  Still feels weak.  Stable for discharge to home with home health.  Discharge Exam: Vitals:   08/20/18 1935 08/21/18 0514  BP: (!) 147/80 (!) 156/67  Pulse: 78 64  Resp: 19 17  Temp: 98.4 F (36.9 C) 98 F (36.7 C)  SpO2: 97% 97%   Vitals:   08/20/18 0616 08/20/18 1429 08/20/18 1935 08/21/18 0514  BP: 134/74 140/82 (!) 147/80 (!) 156/67  Pulse: 71 83 78 64  Resp: _0 Temp: 98 F (36.7 C) 98.2 F (36.8 C) 98.4 F (36.9 C) 98 F (36.7 C)  TempSrc:      SpO2: 97% 97% 97% 97%  Weight:      Height:        General: Pt is alert, awake, not in acute distress Cardiovascular: RRR, S1/S2 +, no rubs, no gallops Respiratory: CTA bilaterally, no wheezing, no rhonchi Abdominal: Soft, NT, ND, bowel sounds + Extremities: no edema, no cyanosis    The results of significant diagnostics from this hospitalization (including  imaging, microbiology, ancillary and laboratory) are listed below for reference.     Microbiology: Recent Results (from the past 240 hour(s))  Blood culture (routine x 2)     Status: None   Collection Time: 08/16/18  6:25 PM  Result Value Ref Range Status   Specimen Description BLOOD LEFT ANTECUBITAL  Final   Special Requests   Final    BOTTLES DRAWN AEROBIC AND ANAEROBIC Blood Culture adequate volume Performed at [facility], [place] [address] [place], [place] [address]    Culture NO GROWTH 5 DAYS  Final   Report Status 08/21/2018 FINAL  Final  Blood culture (routine x 2)     Status: None   Collection Time: 08/16/18  6:25 PM  Result Value Ref Range Status   Specimen Description BLOOD LEFT FOREARM  Final   Special Requests   Final    BOTTLES DRAWN AEROBIC AND ANAEROBIC Blood Culture results may not be optimal due to an excessive volume of blood received in culture bottles Performed at [place] [address], [place], [place] [address]    Culture NO GROWTH 5 DAYS  Final   Report Status 08/21/2018 FINAL  Final  Respiratory Panel by PCR     Status: None   Collection Time: 08/16/18 11:10 PM  Result Value Ref Range Status   Adenovirus NOT DETECTED NOT DETECTED Final   Coronavirus 229E NOT DETECTED NOT DETECTED Final    Comment: (NOTE) The Coronavirus on the Respiratory Panel, DOES NOT test for the novel  Coronavirus (2019 nCoV)    Coronavirus HKU1 NOT DETECTED NOT DETECTED Final   Coronavirus NL63 NOT DETECTED NOT DETECTED Final   Coronavirus OC43 NOT DETECTED NOT DETECTED Final   Metapneumovirus NOT DETECTED NOT DETECTED Final   Rhinovirus / Enterovirus NOT DETECTED NOT DETECTED Final   Influenza A NOT DETECTED NOT DETECTED Final   Influenza B NOT DETECTED NOT DETECTED Final   Parainfluenza Virus 1 NOT DETECTED NOT DETECTED Final   Parainfluenza Virus 2 NOT DETECTED NOT DETECTED Final   Parainfluenza Virus 3 NOT DETECTED NOT DETECTED  Final   Parainfluenza Virus 4 NOT DETECTED NOT DETECTED Final   Respiratory Syncytial Virus NOT DETECTED NOT DETECTED Final   Bordetella pertussis NOT DETECTED NOT DETECTED Final   Chlamydophila pneumoniae NOT DETECTED NOT DETECTED Final   Mycoplasma pneumoniae NOT  DETECTED NOT DETECTED Final    Comment: Performed at [facility], [place] [address], [place], [place] [address]  Urine culture     Status: None   Collection Time: 08/17/18  5:11 AM  Result Value Ref Range Status   Specimen Description   Final    URINE, CLEAN CATCH Performed at [facility], [place] [address], [place], [place] [address]    Special Requests   Final    NONE Performed at [facility], [place] [address] [place], [place] [address]    Culture   Final    NO GROWTH Performed at [facility], [place] [address], [place], [place] [address]    Report Status 08/18/2018 FINAL  Final  Culture, sputum-assessment     Status: None   Collection Time: 08/17/18  4:30 PM  Result Value Ref Range Status   Specimen Description SPU EXPECTORATED  Final   Special Requests NONE  Final   Sputum evaluation   Final    THIS SPECIMEN IS ACCEPTABLE FOR SPUTUM CULTURE Performed at [facility], [place] [address], [place], [place] [address]    Report Status 08/17/2018 FINAL  Final  Culture, respiratory     Status: None   Collection Time: 08/17/18  4:30 PM  Result Value Ref Range Status   Specimen Description   Final    SPU EXPECTORATED Performed at [place] [address] [place], [place] [address]    Special Requests   Final    NONE Reflexed from [id number] Performed at [place] [address] [place], [place] [address]    Gram Stain   Final    FEW WBC PRESENT, PREDOMINANTLY PMN FEW GRAM POSITIVE COCCI IN PAIRS IN CLUSTERS RARE YEAST Performed at [facility], [place] [address], [place], [place] [address]    Culture    Final    FEW METHICILLIN RESISTANT STAPHYLOCOCCUS AUREUS FEW CANDIDA ALBICANS    Report Status 08/20/2018 FINAL  Final   Organism ID, Bacteria METHICILLIN RESISTANT STAPHYLOCOCCUS AUREUS  Final      Susceptibility   Methicillin resistant staphylococcus aureus - MIC*    CIPROFLOXACIN <=0.5 SENSITIVE Sensitive     ERYTHROMYCIN >=8 RESISTANT Resistant     GENTAMICIN <=0.5 SENSITIVE Sensitive     OXACILLIN >=4 RESISTANT Resistant     TETRACYCLINE <=1 SENSITIVE Sensitive     VANCOMYCIN 1 SENSITIVE Sensitive     TRIMETH/SULFA <=10 SENSITIVE Sensitive     CLINDAMYCIN <=0.25 SENSITIVE Sensitive     RIFAMPIN <=0.5 SENSITIVE Sensitive     Inducible Clindamycin NEGATIVE Sensitive     * FEW METHICILLIN RESISTANT STAPHYLOCOCCUS AUREUS  MRSA PCR Screening     Status: Abnormal   Collection Time: 08/18/18  2:55 PM  Result Value Ref Range Status   MRSA by PCR POSITIVE (A) NEGATIVE Final    Comment:        The GeneXpert MRSA Assay (FDA approved for NASAL specimens only), is one component of a comprehensive MRSA colonization surveillance program. It is not intended to diagnose MRSA infection nor to guide or monitor treatment for MRSA infections. RESULT CALLED TO, READ BACK BY AND VERIFIED WITH: Q.CORE AT 2206 ON 08/18/18 BY N.THOMPSON Performed at [facility], [place] [address] [place], [place] [address]      Labs: BNP (last 3 results) No results for input(s): BNP in the last 8760 hours. Basic Metabolic Panel: Recent Labs  Lab  08/16/18 1718 08/17/18 0353 08/18/18 0615 08/19/18 0647 08/20/18 0540  NA 136 136 139 139 141  K 3.8 3.0* 4.0 4.4 4.1  CL 99 102 113* 109 109  CO2 26 24 21* 24 26  GLUCOSE 101* 114* 89 96 98  BUN _0 CREATININE 1.59* 1.48* 1.24* 1.20* 1.29*  CALCIUM 9.1 8.2* 7.8* 8.7* 8.8*  MG  --   --  1.8  --   --    Liver Function Tests: Recent Labs  Lab 08/16/18 1813 08/17/18 0353  AST 36 21  ALT 20 16  ALKPHOS 142* 100  BILITOT  1.9* 0.8  PROT 7.7 5.6*  ALBUMIN 3.7 2.5*   Recent Labs  Lab 08/16/18 1813  LIPASE 39   No results for input(s): AMMONIA in the last 168 hours. CBC: Recent Labs  Lab 08/16/18 1718 08/17/18 0353 08/18/18 0615 08/19/18 0647 08/20/18 0540  WBC 20.6* 17.0* 8.5 8.9 8.3  HGB 13.6 11.2* 9.5* 10.8* 11.0*  HCT 42.9 36.3 30.8* 35.7* 35.3*  MCV 91.3 93.6 93.6 94.2 93.4  PLT 291 249 239 295 278   Cardiac Enzymes: No results for input(s): CKTOTAL, CKMB, CKMBINDEX, TROPONINI in the last 168 hours. BNP: Invalid input(s): POCBNP CBG: No results for input(s): GLUCAP in the last 168 hours. D-Dimer No results for input(s): DDIMER in the last 72 hours. Hgb A1c No results for input(s): HGBA1C in the last 72 hours. Lipid Profile No results for input(s): CHOL, HDL, LDLCALC, TRIG, CHOLHDL, LDLDIRECT in the last 72 hours. Thyroid function studies No results for input(s): TSH, T4TOTAL, T3FREE, THYROIDAB in the last 72 hours.  Invalid input(s): FREET3 Anemia work up No results for input(s): VITAMINB12, FOLATE, FERRITIN, TIBC, IRON, RETICCTPCT in the last 72 hours. Urinalysis    Component Value Date/Time   COLORURINE YELLOW 08/17/2018 0505   APPEARANCEUR CLEAR 08/17/2018 0505   LABSPEC 1.024 08/17/2018 0505   PHURINE 6.0 08/17/2018 0505   GLUCOSEU NEGATIVE 08/17/2018 0505   GLUCOSEU NEGATIVE 11/21/2015 1156   HGBUR MODERATE (A) 08/17/2018 0505   BILIRUBINUR NEGATIVE 08/17/2018 0505   BILIRUBINUR Neg 09/07/2012 1426   KETONESUR NEGATIVE 08/17/2018 0505   PROTEINUR NEGATIVE 08/17/2018 0505   UROBILINOGEN 0.2 11/21/2015 1156   NITRITE NEGATIVE 08/17/2018 0505   LEUKOCYTESUR NEGATIVE 08/17/2018 0505   Sepsis Labs Invalid input(s): PROCALCITONIN,  WBC,  LACTICIDVEN Microbiology Recent Results (from the past 240 hour(s))  Blood culture (routine x 2)     Status: None   Collection Time: 08/16/18  6:25 PM  Result Value Ref Range Status   Specimen Description BLOOD LEFT ANTECUBITAL   Final   Special Requests   Final    BOTTLES DRAWN AEROBIC AND ANAEROBIC Blood Culture adequate volume Performed at [facility], [place] [address], [place], [place] [address]    Culture NO GROWTH 5 DAYS  Final   Report Status 08/21/2018 FINAL  Final  Blood culture (routine x 2)     Status: None   Collection Time: 08/16/18  6:25 PM  Result Value Ref Range Status   Specimen Description BLOOD LEFT FOREARM  Final   Special Requests   Final    BOTTLES DRAWN AEROBIC AND ANAEROBIC Blood Culture results may not be optimal due to an excessive volume of blood received in culture bottles Performed at [place] [address], [place], [place] [address]    Culture NO GROWTH 5 DAYS  Final   Report Status 08/21/2018 FINAL  Final  Respiratory  Panel by PCR     Status: None   Collection Time: 08/16/18 11:10 PM  Result Value Ref Range Status   Adenovirus NOT DETECTED NOT DETECTED Final   Coronavirus 229E NOT DETECTED NOT DETECTED Final    Comment: (NOTE) The Coronavirus on the Respiratory Panel, DOES NOT test for the novel  Coronavirus (2019 nCoV)    Coronavirus HKU1 NOT DETECTED NOT DETECTED Final   Coronavirus NL63 NOT DETECTED NOT DETECTED Final   Coronavirus OC43 NOT DETECTED NOT DETECTED Final   Metapneumovirus NOT DETECTED NOT DETECTED Final   Rhinovirus / Enterovirus NOT DETECTED NOT DETECTED Final   Influenza A NOT DETECTED NOT DETECTED Final   Influenza B NOT DETECTED NOT DETECTED Final   Parainfluenza Virus 1 NOT DETECTED NOT DETECTED Final   Parainfluenza Virus 2 NOT DETECTED NOT DETECTED Final   Parainfluenza Virus 3 NOT DETECTED NOT DETECTED Final   Parainfluenza Virus 4 NOT DETECTED NOT DETECTED Final   Respiratory Syncytial Virus NOT DETECTED NOT DETECTED Final   Bordetella pertussis NOT DETECTED NOT DETECTED Final   Chlamydophila pneumoniae NOT DETECTED NOT DETECTED Final   Mycoplasma pneumoniae NOT DETECTED NOT DETECTED Final    Comment:  Performed at [facility], [address], [place], [place] [address]  Urine culture     Status: None   Collection Time: 08/17/18  5:11 AM  Result Value Ref Range Status   Specimen Description   Final    URINE, CLEAN CATCH Performed at [facility], [place] [address] [place], [place] [address]    Special Requests   Final    NONE Performed at [facility], [place] [address], [place], [place] [address]    Culture   Final    NO GROWTH Performed at [facility], [place] [address] [place], [place] [address]    Report Status 08/18/2018 FINAL  Final  Culture, sputum-assessment     Status: None   Collection Time: 08/17/18  4:30 PM  Result Value Ref Range Status   Specimen Description SPU EXPECTORATED  Final   Special Requests NONE  Final   Sputum evaluation   Final    THIS SPECIMEN IS ACCEPTABLE FOR SPUTUM CULTURE Performed at [facility], [place] [address] [place], [place] [address]    Report Status 08/17/2018 FINAL  Final  Culture, respiratory     Status: None   Collection Time: 08/17/18  4:30 PM  Result Value Ref Range Status   Specimen Description   Final    SPU EXPECTORATED Performed at [place] [address] [place], [place] [address]    Special Requests   Final    NONE Reflexed from [id number] Performed at [place] [address] [place], [place] [address]    Gram Stain   Final    FEW WBC PRESENT, PREDOMINANTLY PMN FEW GRAM POSITIVE COCCI IN PAIRS IN CLUSTERS RARE YEAST Performed at [facility], [place] [address] [place], [place] [address]    Culture   Final    FEW METHICILLIN RESISTANT STAPHYLOCOCCUS AUREUS FEW CANDIDA ALBICANS    Report Status 08/20/2018 FINAL  Final   Organism ID, Bacteria METHICILLIN RESISTANT STAPHYLOCOCCUS AUREUS  Final      Susceptibility   Methicillin resistant staphylococcus aureus - MIC*    CIPROFLOXACIN <=0.5 SENSITIVE  Sensitive     ERYTHROMYCIN >=8 RESISTANT Resistant     GENTAMICIN <=0.5 SENSITIVE Sensitive     OXACILLIN >=  4 RESISTANT Resistant     TETRACYCLINE <=1 SENSITIVE Sensitive     VANCOMYCIN 1 SENSITIVE Sensitive     TRIMETH/SULFA <=10 SENSITIVE Sensitive     CLINDAMYCIN <=0.25 SENSITIVE Sensitive     RIFAMPIN <=0.5 SENSITIVE Sensitive     Inducible Clindamycin NEGATIVE Sensitive     * FEW METHICILLIN RESISTANT STAPHYLOCOCCUS AUREUS  MRSA PCR Screening     Status: Abnormal   Collection Time: 08/18/18  2:55 PM  Result Value Ref Range Status   MRSA by PCR POSITIVE (A) NEGATIVE Final    Comment:        The GeneXpert MRSA Assay (FDA approved for NASAL specimens only), is one component of a comprehensive MRSA colonization surveillance program. It is not intended to diagnose MRSA infection nor to guide or monitor treatment for MRSA infections. RESULT CALLED TO, READ BACK BY AND VERIFIED WITH: Q.CORE AT 2206 ON 08/18/18 BY N.THOMPSON Performed at [facility], [place] [address] [place], [place] [address]     Please note: You were cared for by a hospitalist during your hospital stay. Once you are discharged, your primary care physician will handle any further medical issues. Please note that NO REFILLS for any discharge medications will be authorized once you are discharged, as it is imperative that you return to your primary care physician (or establish a relationship with a primary care physician if you do not have one) for your post hospital discharge needs so that they can reassess your need for medications and monitor your lab values.    Time coordinating discharge: 40 minutes  SIGNED:   Shelly Coss, MD  [facility] 08/21/2018, 10:46 AM Pager [phone]  If 7PM-7AM, please contact night-coverage [email or web address] Password [email or web address]

## 2018-08-24 ENCOUNTER — Other Ambulatory Visit: Payer: Self-pay

## 2018-08-24 DIAGNOSIS — E440 Moderate protein-calorie malnutrition: Secondary | ICD-10-CM | POA: Diagnosis not present

## 2018-08-24 DIAGNOSIS — Z86718 Personal history of other venous thrombosis and embolism: Secondary | ICD-10-CM | POA: Diagnosis not present

## 2018-08-24 DIAGNOSIS — I480 Paroxysmal atrial fibrillation: Secondary | ICD-10-CM | POA: Diagnosis not present

## 2018-08-24 DIAGNOSIS — F419 Anxiety disorder, unspecified: Secondary | ICD-10-CM | POA: Diagnosis not present

## 2018-08-24 DIAGNOSIS — M1990 Unspecified osteoarthritis, unspecified site: Secondary | ICD-10-CM | POA: Diagnosis not present

## 2018-08-24 DIAGNOSIS — I6529 Occlusion and stenosis of unspecified carotid artery: Secondary | ICD-10-CM | POA: Diagnosis not present

## 2018-08-24 DIAGNOSIS — M8580 Other specified disorders of bone density and structure, unspecified site: Secondary | ICD-10-CM | POA: Diagnosis not present

## 2018-08-24 DIAGNOSIS — I739 Peripheral vascular disease, unspecified: Secondary | ICD-10-CM | POA: Diagnosis not present

## 2018-08-24 DIAGNOSIS — I129 Hypertensive chronic kidney disease with stage 1 through stage 4 chronic kidney disease, or unspecified chronic kidney disease: Secondary | ICD-10-CM | POA: Diagnosis not present

## 2018-08-24 DIAGNOSIS — J439 Emphysema, unspecified: Secondary | ICD-10-CM | POA: Diagnosis not present

## 2018-08-24 DIAGNOSIS — E785 Hyperlipidemia, unspecified: Secondary | ICD-10-CM | POA: Diagnosis not present

## 2018-08-24 DIAGNOSIS — I2510 Atherosclerotic heart disease of native coronary artery without angina pectoris: Secondary | ICD-10-CM | POA: Diagnosis not present

## 2018-08-24 DIAGNOSIS — J15212 Pneumonia due to Methicillin resistant Staphylococcus aureus: Secondary | ICD-10-CM | POA: Diagnosis not present

## 2018-08-24 DIAGNOSIS — M5126 Other intervertebral disc displacement, lumbar region: Secondary | ICD-10-CM | POA: Diagnosis not present

## 2018-08-24 DIAGNOSIS — Z792 Long term (current) use of antibiotics: Secondary | ICD-10-CM | POA: Diagnosis not present

## 2018-08-24 DIAGNOSIS — N183 Chronic kidney disease, stage 3 (moderate): Secondary | ICD-10-CM | POA: Diagnosis not present

## 2018-08-24 DIAGNOSIS — G629 Polyneuropathy, unspecified: Secondary | ICD-10-CM | POA: Diagnosis not present

## 2018-08-24 DIAGNOSIS — E039 Hypothyroidism, unspecified: Secondary | ICD-10-CM | POA: Diagnosis not present

## 2018-08-24 NOTE — Patient Outreach (Signed)
[facility]) Care Management  08/24/2018  Deborah Shields Oct 23, 1940 [id number]   EMMI- General Discharge RED ON EMMI ALERT Day # 1 Date: 08/23/2018 Red Alert Reason:  Know who to call about changes in condition? No  Scheduled follow-up? No   Outreach attempt: spoke with daughter Lynelle Smoke.  She is able to verify HIPAA.  She states that patient is still weak and has some confusion.  She feels however patient is better than she was previously.  She states that patient has an appointment with Dr. Felipa Eth on Wednesday.  Discussed red alert with daughter.  She denies any problems with patient right now such as fever, chills, patient being lethargic, worsening cough or breathing, and worsening phlegm production.  Discussed with her worsening signs of pneumonia and when to notify physician.  She states that home health to come out today. Advised her that evaluation will be done today and appropriate services will be started.  She verbalized understanding.  Discussed with daughter mothers age and diagnosis and how it will take some time for patient to get back to her normal state.  She verbalized understanding.  She states that she will call Dr. Felipa Eth, if she feels patient will need to be seen before Wednesday but she doubts she will.  She declines any other needs or concerns at this time.     Plan: RN CM will close case.    Jone Baseman, RN, MSN [facility] Care Management Care Management Coordinator Direct Line [phone] Toll Free: [phone]  Fax: [phone]

## 2018-08-25 DIAGNOSIS — I129 Hypertensive chronic kidney disease with stage 1 through stage 4 chronic kidney disease, or unspecified chronic kidney disease: Secondary | ICD-10-CM | POA: Diagnosis not present

## 2018-08-25 DIAGNOSIS — N183 Chronic kidney disease, stage 3 (moderate): Secondary | ICD-10-CM | POA: Diagnosis not present

## 2018-08-25 DIAGNOSIS — J15212 Pneumonia due to Methicillin resistant Staphylococcus aureus: Secondary | ICD-10-CM | POA: Diagnosis not present

## 2018-08-25 DIAGNOSIS — G629 Polyneuropathy, unspecified: Secondary | ICD-10-CM | POA: Diagnosis not present

## 2018-08-25 DIAGNOSIS — I480 Paroxysmal atrial fibrillation: Secondary | ICD-10-CM | POA: Diagnosis not present

## 2018-08-25 DIAGNOSIS — J439 Emphysema, unspecified: Secondary | ICD-10-CM | POA: Diagnosis not present

## 2018-08-26 DIAGNOSIS — R21 Rash and other nonspecific skin eruption: Secondary | ICD-10-CM | POA: Diagnosis not present

## 2018-08-26 DIAGNOSIS — Z79899 Other long term (current) drug therapy: Secondary | ICD-10-CM | POA: Diagnosis not present

## 2018-08-26 DIAGNOSIS — N183 Chronic kidney disease, stage 3 (moderate): Secondary | ICD-10-CM | POA: Diagnosis not present

## 2018-08-26 DIAGNOSIS — I129 Hypertensive chronic kidney disease with stage 1 through stage 4 chronic kidney disease, or unspecified chronic kidney disease: Secondary | ICD-10-CM | POA: Diagnosis not present

## 2018-08-26 DIAGNOSIS — B0052 Herpesviral keratitis: Secondary | ICD-10-CM | POA: Diagnosis not present

## 2018-08-26 DIAGNOSIS — J15212 Pneumonia due to Methicillin resistant Staphylococcus aureus: Secondary | ICD-10-CM | POA: Diagnosis not present

## 2018-08-27 DIAGNOSIS — I129 Hypertensive chronic kidney disease with stage 1 through stage 4 chronic kidney disease, or unspecified chronic kidney disease: Secondary | ICD-10-CM | POA: Diagnosis not present

## 2018-08-27 DIAGNOSIS — G629 Polyneuropathy, unspecified: Secondary | ICD-10-CM | POA: Diagnosis not present

## 2018-08-27 DIAGNOSIS — J15212 Pneumonia due to Methicillin resistant Staphylococcus aureus: Secondary | ICD-10-CM | POA: Diagnosis not present

## 2018-08-27 DIAGNOSIS — N183 Chronic kidney disease, stage 3 (moderate): Secondary | ICD-10-CM | POA: Diagnosis not present

## 2018-08-27 DIAGNOSIS — J439 Emphysema, unspecified: Secondary | ICD-10-CM | POA: Diagnosis not present

## 2018-08-27 DIAGNOSIS — I480 Paroxysmal atrial fibrillation: Secondary | ICD-10-CM | POA: Diagnosis not present

## 2018-08-28 DIAGNOSIS — I129 Hypertensive chronic kidney disease with stage 1 through stage 4 chronic kidney disease, or unspecified chronic kidney disease: Secondary | ICD-10-CM | POA: Diagnosis not present

## 2018-08-28 DIAGNOSIS — J439 Emphysema, unspecified: Secondary | ICD-10-CM | POA: Diagnosis not present

## 2018-08-28 DIAGNOSIS — I480 Paroxysmal atrial fibrillation: Secondary | ICD-10-CM | POA: Diagnosis not present

## 2018-08-28 DIAGNOSIS — G629 Polyneuropathy, unspecified: Secondary | ICD-10-CM | POA: Diagnosis not present

## 2018-08-28 DIAGNOSIS — J15212 Pneumonia due to Methicillin resistant Staphylococcus aureus: Secondary | ICD-10-CM | POA: Diagnosis not present

## 2018-08-28 DIAGNOSIS — B0052 Herpesviral keratitis: Secondary | ICD-10-CM | POA: Diagnosis not present

## 2018-08-28 DIAGNOSIS — N183 Chronic kidney disease, stage 3 (moderate): Secondary | ICD-10-CM | POA: Diagnosis not present

## 2018-09-01 DIAGNOSIS — J439 Emphysema, unspecified: Secondary | ICD-10-CM | POA: Diagnosis not present

## 2018-09-01 DIAGNOSIS — J15212 Pneumonia due to Methicillin resistant Staphylococcus aureus: Secondary | ICD-10-CM | POA: Diagnosis not present

## 2018-09-01 DIAGNOSIS — N183 Chronic kidney disease, stage 3 (moderate): Secondary | ICD-10-CM | POA: Diagnosis not present

## 2018-09-01 DIAGNOSIS — I129 Hypertensive chronic kidney disease with stage 1 through stage 4 chronic kidney disease, or unspecified chronic kidney disease: Secondary | ICD-10-CM | POA: Diagnosis not present

## 2018-09-01 DIAGNOSIS — I480 Paroxysmal atrial fibrillation: Secondary | ICD-10-CM | POA: Diagnosis not present

## 2018-09-01 DIAGNOSIS — G629 Polyneuropathy, unspecified: Secondary | ICD-10-CM | POA: Diagnosis not present

## 2018-09-02 DIAGNOSIS — G629 Polyneuropathy, unspecified: Secondary | ICD-10-CM | POA: Diagnosis not present

## 2018-09-02 DIAGNOSIS — I480 Paroxysmal atrial fibrillation: Secondary | ICD-10-CM | POA: Diagnosis not present

## 2018-09-02 DIAGNOSIS — J439 Emphysema, unspecified: Secondary | ICD-10-CM | POA: Diagnosis not present

## 2018-09-02 DIAGNOSIS — N183 Chronic kidney disease, stage 3 (moderate): Secondary | ICD-10-CM | POA: Diagnosis not present

## 2018-09-02 DIAGNOSIS — I129 Hypertensive chronic kidney disease with stage 1 through stage 4 chronic kidney disease, or unspecified chronic kidney disease: Secondary | ICD-10-CM | POA: Diagnosis not present

## 2018-09-02 DIAGNOSIS — J15212 Pneumonia due to Methicillin resistant Staphylococcus aureus: Secondary | ICD-10-CM | POA: Diagnosis not present

## 2018-09-03 DIAGNOSIS — J15212 Pneumonia due to Methicillin resistant Staphylococcus aureus: Secondary | ICD-10-CM | POA: Diagnosis not present

## 2018-09-03 DIAGNOSIS — G629 Polyneuropathy, unspecified: Secondary | ICD-10-CM | POA: Diagnosis not present

## 2018-09-03 DIAGNOSIS — I129 Hypertensive chronic kidney disease with stage 1 through stage 4 chronic kidney disease, or unspecified chronic kidney disease: Secondary | ICD-10-CM | POA: Diagnosis not present

## 2018-09-03 DIAGNOSIS — I480 Paroxysmal atrial fibrillation: Secondary | ICD-10-CM | POA: Diagnosis not present

## 2018-09-03 DIAGNOSIS — N183 Chronic kidney disease, stage 3 (moderate): Secondary | ICD-10-CM | POA: Diagnosis not present

## 2018-09-03 DIAGNOSIS — J439 Emphysema, unspecified: Secondary | ICD-10-CM | POA: Diagnosis not present

## 2018-09-04 DIAGNOSIS — B0052 Herpesviral keratitis: Secondary | ICD-10-CM | POA: Diagnosis not present

## 2018-09-08 DIAGNOSIS — I480 Paroxysmal atrial fibrillation: Secondary | ICD-10-CM | POA: Diagnosis not present

## 2018-09-08 DIAGNOSIS — G629 Polyneuropathy, unspecified: Secondary | ICD-10-CM | POA: Diagnosis not present

## 2018-09-08 DIAGNOSIS — N183 Chronic kidney disease, stage 3 (moderate): Secondary | ICD-10-CM | POA: Diagnosis not present

## 2018-09-08 DIAGNOSIS — J439 Emphysema, unspecified: Secondary | ICD-10-CM | POA: Diagnosis not present

## 2018-09-08 DIAGNOSIS — I129 Hypertensive chronic kidney disease with stage 1 through stage 4 chronic kidney disease, or unspecified chronic kidney disease: Secondary | ICD-10-CM | POA: Diagnosis not present

## 2018-09-08 DIAGNOSIS — J15212 Pneumonia due to Methicillin resistant Staphylococcus aureus: Secondary | ICD-10-CM | POA: Diagnosis not present

## 2018-09-09 DIAGNOSIS — J15212 Pneumonia due to Methicillin resistant Staphylococcus aureus: Secondary | ICD-10-CM | POA: Diagnosis not present

## 2018-09-09 DIAGNOSIS — B0052 Herpesviral keratitis: Secondary | ICD-10-CM | POA: Diagnosis not present

## 2018-09-09 DIAGNOSIS — N183 Chronic kidney disease, stage 3 (moderate): Secondary | ICD-10-CM | POA: Diagnosis not present

## 2018-09-09 DIAGNOSIS — J439 Emphysema, unspecified: Secondary | ICD-10-CM | POA: Diagnosis not present

## 2018-09-09 DIAGNOSIS — G629 Polyneuropathy, unspecified: Secondary | ICD-10-CM | POA: Diagnosis not present

## 2018-09-09 DIAGNOSIS — I480 Paroxysmal atrial fibrillation: Secondary | ICD-10-CM | POA: Diagnosis not present

## 2018-09-09 DIAGNOSIS — I129 Hypertensive chronic kidney disease with stage 1 through stage 4 chronic kidney disease, or unspecified chronic kidney disease: Secondary | ICD-10-CM | POA: Diagnosis not present

## 2018-09-21 ENCOUNTER — Ambulatory Visit
Admission: RE | Admit: 2018-09-21 | Discharge: 2018-09-21 | Disposition: A | Discharge status: Home / Self Care | Payer: Medicare Other | Source: Ambulatory Visit | Attending: Geriatric Medicine | Admitting: Geriatric Medicine

## 2018-09-21 ENCOUNTER — Other Ambulatory Visit: Payer: Self-pay | Admitting: Geriatric Medicine

## 2018-09-21 DIAGNOSIS — M79671 Pain in right foot: Secondary | ICD-10-CM | POA: Diagnosis not present

## 2018-09-21 DIAGNOSIS — J189 Pneumonia, unspecified organism: Secondary | ICD-10-CM | POA: Diagnosis not present

## 2018-09-21 DIAGNOSIS — I480 Paroxysmal atrial fibrillation: Secondary | ICD-10-CM | POA: Diagnosis not present

## 2018-09-21 DIAGNOSIS — I129 Hypertensive chronic kidney disease with stage 1 through stage 4 chronic kidney disease, or unspecified chronic kidney disease: Secondary | ICD-10-CM | POA: Diagnosis not present

## 2018-09-21 DIAGNOSIS — R0602 Shortness of breath: Secondary | ICD-10-CM | POA: Diagnosis not present

## 2018-09-21 DIAGNOSIS — J449 Chronic obstructive pulmonary disease, unspecified: Secondary | ICD-10-CM | POA: Diagnosis not present

## 2018-09-21 DIAGNOSIS — N183 Chronic kidney disease, stage 3 (moderate): Secondary | ICD-10-CM | POA: Diagnosis not present

## 2018-09-25 DIAGNOSIS — B0052 Herpesviral keratitis: Secondary | ICD-10-CM | POA: Diagnosis not present

## 2018-10-06 DIAGNOSIS — B0052 Herpesviral keratitis: Secondary | ICD-10-CM | POA: Diagnosis not present

## 2018-11-24 DIAGNOSIS — Z79899 Other long term (current) drug therapy: Secondary | ICD-10-CM | POA: Diagnosis not present

## 2018-11-24 DIAGNOSIS — J449 Chronic obstructive pulmonary disease, unspecified: Secondary | ICD-10-CM | POA: Diagnosis not present

## 2018-11-24 DIAGNOSIS — E039 Hypothyroidism, unspecified: Secondary | ICD-10-CM | POA: Diagnosis not present

## 2018-11-24 DIAGNOSIS — N183 Chronic kidney disease, stage 3 (moderate): Secondary | ICD-10-CM | POA: Diagnosis not present

## 2018-11-24 DIAGNOSIS — Z1389 Encounter for screening for other disorder: Secondary | ICD-10-CM | POA: Diagnosis not present

## 2018-11-24 DIAGNOSIS — I700 Atherosclerosis of aorta: Secondary | ICD-10-CM | POA: Diagnosis not present

## 2018-11-24 DIAGNOSIS — I480 Paroxysmal atrial fibrillation: Secondary | ICD-10-CM | POA: Diagnosis not present

## 2018-11-24 DIAGNOSIS — I129 Hypertensive chronic kidney disease with stage 1 through stage 4 chronic kidney disease, or unspecified chronic kidney disease: Secondary | ICD-10-CM | POA: Diagnosis not present

## 2018-11-24 DIAGNOSIS — Z0000 Encounter for general adult medical examination without abnormal findings: Secondary | ICD-10-CM | POA: Diagnosis not present

## 2018-11-24 DIAGNOSIS — Z23 Encounter for immunization: Secondary | ICD-10-CM | POA: Diagnosis not present

## 2018-11-24 DIAGNOSIS — N3281 Overactive bladder: Secondary | ICD-10-CM | POA: Diagnosis not present

## 2018-11-24 DIAGNOSIS — E7800 Pure hypercholesterolemia, unspecified: Secondary | ICD-10-CM | POA: Diagnosis not present

## 2018-11-30 DIAGNOSIS — H04552 Acquired stenosis of left nasolacrimal duct: Secondary | ICD-10-CM | POA: Diagnosis not present

## 2018-11-30 DIAGNOSIS — H04411 Chronic dacryocystitis of right lacrimal passage: Secondary | ICD-10-CM | POA: Diagnosis not present

## 2018-11-30 DIAGNOSIS — H04223 Epiphora due to insufficient drainage, bilateral lacrimal glands: Secondary | ICD-10-CM | POA: Diagnosis not present

## 2018-11-30 DIAGNOSIS — H04541 Stenosis of right lacrimal canaliculi: Secondary | ICD-10-CM | POA: Diagnosis not present

## 2018-11-30 DIAGNOSIS — H04221 Epiphora due to insufficient drainage, right lacrimal gland: Secondary | ICD-10-CM | POA: Diagnosis not present

## 2018-11-30 DIAGNOSIS — H04222 Epiphora due to insufficient drainage, left lacrimal gland: Secondary | ICD-10-CM | POA: Diagnosis not present

## 2018-11-30 DIAGNOSIS — H04553 Acquired stenosis of bilateral nasolacrimal duct: Secondary | ICD-10-CM | POA: Diagnosis not present

## 2018-11-30 DIAGNOSIS — H04551 Acquired stenosis of right nasolacrimal duct: Secondary | ICD-10-CM | POA: Diagnosis not present

## 2018-12-09 DIAGNOSIS — L570 Actinic keratosis: Secondary | ICD-10-CM | POA: Diagnosis not present

## 2018-12-09 DIAGNOSIS — Z85828 Personal history of other malignant neoplasm of skin: Secondary | ICD-10-CM | POA: Diagnosis not present

## 2018-12-09 DIAGNOSIS — L821 Other seborrheic keratosis: Secondary | ICD-10-CM | POA: Diagnosis not present

## 2018-12-09 DIAGNOSIS — D692 Other nonthrombocytopenic purpura: Secondary | ICD-10-CM | POA: Diagnosis not present

## 2018-12-09 DIAGNOSIS — D1801 Hemangioma of skin and subcutaneous tissue: Secondary | ICD-10-CM | POA: Diagnosis not present

## 2018-12-22 DIAGNOSIS — M25512 Pain in left shoulder: Secondary | ICD-10-CM | POA: Diagnosis not present

## 2018-12-28 ENCOUNTER — Other Ambulatory Visit: Payer: Self-pay

## 2018-12-28 DIAGNOSIS — I779 Disorder of arteries and arterioles, unspecified: Secondary | ICD-10-CM

## 2018-12-29 ENCOUNTER — Ambulatory Visit: Payer: Medicare Other | Admitting: Family

## 2018-12-29 ENCOUNTER — Encounter (HOSPITAL_COMMUNITY): Payer: Medicare Other

## 2018-12-31 DIAGNOSIS — M25512 Pain in left shoulder: Secondary | ICD-10-CM | POA: Diagnosis not present

## 2019-01-06 ENCOUNTER — Ambulatory Visit: Payer: Medicare Other | Admitting: Family

## 2019-01-12 ENCOUNTER — Ambulatory Visit (HOSPITAL_COMMUNITY)
Admission: RE | Admit: 2019-01-12 | Discharge: 2019-01-12 | Disposition: A | Discharge status: Home / Self Care | Payer: Medicare Other | Source: Ambulatory Visit | Attending: Family | Admitting: Family

## 2019-01-12 ENCOUNTER — Ambulatory Visit (INDEPENDENT_AMBULATORY_CARE_PROVIDER_SITE_OTHER)
Admission: RE | Admit: 2019-01-12 | Discharge: 2019-01-12 | Disposition: A | Discharge status: Home / Self Care | Payer: Medicare Other | Source: Ambulatory Visit | Attending: Family | Admitting: Family

## 2019-01-12 ENCOUNTER — Other Ambulatory Visit: Payer: Self-pay

## 2019-01-12 DIAGNOSIS — I779 Disorder of arteries and arterioles, unspecified: Secondary | ICD-10-CM

## 2019-01-13 ENCOUNTER — Telehealth: Payer: Self-pay | Admitting: *Deleted

## 2019-01-13 NOTE — Telephone Encounter (Signed)
Virtual Visit Pre-Appointment Phone Call  Today, I spoke with Deborah Shields and performed the following actions:  1. I explained that we are currently trying to limit exposure to the COVID-19 virus by seeing patients at home rather than in the office.  I explained that the visits are best done by video, but can be done by telephone.  I asked the patient if a virtual visit that the patient would like to try instead of coming into the office. Deborah Shields agreed to proceed with the virtual visit scheduled with Deborah Level Nickel NP on 01/14/19.     2. I confirmed the BEST phone number to call the day of the visit and- I included this in appointment notes.  I asked if the patient had access to (through a family member/friend) a smartphone with video capability to be used for her visit?"  The patient said yeI confirmed consent by   a. sending through Deborah Shields or by email the Deborah Shields as written at the end of this message or  b. verbally as listed below. i. This visit is being performed in the setting of COVID-19. ii. All virtual visits are billed to your insurance company just like a normal visit would be.   iii. We'd like you to understand that the technology does not allow for your provider to perform an examination, and thus may limit your provider's ability to fully assess your condition.  iv. If your provider identifies any concerns that need to be evaluated in person, we will make arrangements to do so.   v. Finally, though the technology is pretty good, we cannot assure that it will always work on either your or our end, and in the setting of a video visit, we may have to convert it to a phone-only visit.  In either situation, we cannot ensure that we have a secure connection.   vi. Are you willing to proceed?"  STAFF: Did the patient verbally acknowledge consent to telehealth visit? Document YES/NO here: YES  2. I advised the patient to be  prepared - I asked that the patient, on the day of her visit, record any information possible with the equipment at her home, such as blood pressure, pulse, oxygen saturation, and your weight and write them all down. I asked the patient to have a pen and paper handy nearby the day of the visit as well.  3. If the patient was scheduled for a video visit, I informed the patient that the visit with the doctor would start with a text to the smartphone # given to [place] by the patient.         If the patient was scheduled for a telephone call, I informed the patient that the visit with the doctor would start with a call to the telephone # given to [place] by the patient.  4. I Informed patient they will receive a phone call 15 minutes prior to their appointment time from a Deborah Shields or nurse to review medications, allergies, etc. to prepare for the visit.    TELEPHONE CALL NOTE  Deborah Shields has been deemed a candidate for a follow-up tele-health visit to limit community exposure during the Covid-19 pandemic. I spoke with the patient via phone to ensure availability of phone/video source, confirm preferred email & phone number, and discuss instructions and expectations.  I reminded Deborah Shields to be prepared with any vital sign and/or heart rhythm information that could  potentially be obtained via home monitoring, at the time of her visit. I reminded Deborah Shields to expect a phone call prior to her visit.  Deborah Shields, NT 01/13/2019 10:05 AM     FULL LENGTH CONSENT FOR TELE-HEALTH VISIT   I hereby voluntarily request, consent and authorize [facility] and its employed or contracted physicians, physician assistants, nurse practitioners or other licensed health care professionals (the Practitioner), to provide me with telemedicine health care services (the "Services") as deemed necessary by the treating Practitioner. I acknowledge and consent to receive the Services by the  Practitioner via telemedicine. I understand that the telemedicine visit will involve communicating with the Practitioner through live audiovisual communication technology and the disclosure of certain medical information by electronic transmission. I acknowledge that I have been given the opportunity to request an in-person assessment or other available alternative prior to the telemedicine visit and am voluntarily participating in the telemedicine visit.  I understand that I have the right to withhold or withdraw my consent to the use of telemedicine in the course of my care at any time, without affecting my right to future care or treatment, and that the Practitioner or I may terminate the telemedicine visit at any time. I understand that I have the right to inspect all information obtained and/or recorded in the course of the telemedicine visit and may receive copies of available information for a reasonable fee.  I understand that some of the potential risks of receiving the Services via telemedicine include:  Marland Kitchen Delay or interruption in medical evaluation due to technological equipment failure or disruption; . Information transmitted may not be sufficient (e.g. poor resolution of images) to allow for appropriate medical decision making by the Practitioner; and/or  . In rare instances, security protocols could fail, causing a breach of personal health information.  Furthermore, I acknowledge that it is my responsibility to provide information about my medical history, conditions and care that is complete and accurate to the best of my ability. I acknowledge that Practitioner's advice, recommendations, and/or decision may be based on factors not within their control, such as incomplete or inaccurate data provided by me or distortions of diagnostic images or specimens that may result from electronic transmissions. I understand that the practice of medicine is not an exact science and that Practitioner makes  no warranties or guarantees regarding treatment outcomes. I acknowledge that I will receive a copy of this consent concurrently upon execution via email to the email address I last provided but may also request a printed copy by calling the office of Deborah Shields.    I understand that my insurance will be billed for this visit.   I have read or had this consent read to me. . I understand the contents of this consent, which adequately explains the benefits and risks of the Services being provided via telemedicine.  . I have been provided ample opportunity to ask questions regarding this consent and the Services and have had my questions answered to my satisfaction. . I give my informed consent for the services to be provided through the use of telemedicine in my medical care  By participating in this telemedicine visit I agree to the above.

## 2019-01-14 ENCOUNTER — Ambulatory Visit (INDEPENDENT_AMBULATORY_CARE_PROVIDER_SITE_OTHER): Payer: Medicare Other | Admitting: Family

## 2019-01-14 ENCOUNTER — Other Ambulatory Visit: Payer: Self-pay

## 2019-01-14 ENCOUNTER — Encounter: Payer: Self-pay | Admitting: Family

## 2019-01-14 DIAGNOSIS — I779 Disorder of arteries and arterioles, unspecified: Secondary | ICD-10-CM

## 2019-01-14 DIAGNOSIS — I773 Arterial fibromuscular dysplasia: Secondary | ICD-10-CM

## 2019-01-14 NOTE — Progress Notes (Signed)
Virtual Visit via Telephone Note   I connected with Deborah Shields on 01/14/2019 using the Doxy[employer]me by telephone and verified that I was speaking with the correct person using two identifiers. Patient was located at her home and accompanied by herself. I am located at the [facility] office[facility]clinic.   The limitations of evaluation and management by telemedicine and the availability of in person appointments have been previously discussed with the patient and are documented in the patients chart. The patient expressed understanding and consented to proceed.  PCP: Lajean Manes, MD  Chief Complaint: Follow up extracranial carotid artery fibromuscular dysplasia and peripheral artery occlusive disease    History of Present Illness: Deborah Shields is a 78 y.o. female whomDr. [name] has been monitoring forfibromuscular dysplasiaof carotid arteries and peripheral artery occlusive disease.  Shedenies ever having a stroke or TIA symptoms. Specificallyshedenies a history of: amaurosis fugax or monocular blindness,unilateral facial drooping, hemiplegia, or receptive or expressive aphasia.  She denies claudicationtype sx's with walking, denies non-healing wounds.   Sees a cardiologist, Dr. Orene Desanctis, due to family hx of CAD and personal hx of murmur.   She had corticosteroid injections in her T-spine, Dr. Maryjean Ka, for pain in the left shoulder, tingling and numbness in left arm/hand which helped. She had [place]otted Feverin the Summer 2017.   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  Dr. [name] last evaluated pt on 09-11-17. At that timepatient had a lower extremity arterial duplex which showed monophasic waveforms in the right superficial femoral artery which may represent a short segment occlusion. Left lower extremity had triphasic waveforms all the way down with the exception of her anterior and posterior tibial arteries which were patent but  had biphasic waveform suggesting some mild occlusive disease. Bilateral foot pain most likely not secondary to arterial occlusive disease. She certainly does not have rest pain. She did have findings of arterial occlusive disease in both lower extremities right worse than left. However, Dr. [name] didnot believe that she was at risk of limb loss with her state of perfusionat that time. If she developed rest pain or nonhealing wounds we would consider pursuing this further with an arteriogram. However with her history of renal dysfunctionDr. Migdalia Dk not consider an arteriogram for her with the possibility of worsening renal function for lifestyle symptoms alone unless these were very debilitating. She didnot feel disabled by her symptoms at that time. Patient was to follow-up with our nurse practitioner in [phone] for her carotid surveillance exam. We will add on bilateral lower extremity duplex exam as well to see if there is any difference. If it is stable the most likely no further workup indicated unless she develops rest pain or nonhealing wounds. This was discussed with the patient and her daughter thatday. They were reassured that lifetime risk of limb loss even with perfusion down as low as 50% is less than 5% lifetime since she really has no significant risk factors.  She states her arthritis occassionally bothers her.   Pt was taken off the gout medication due to her rheumatologist surmising that she does not have gout, that she has lack of arterial perfusion in her feet.  She walks at least 30 minutes daily in the course of her day.   Pt states her back problem was "toe knee short leg", addressed by Dr. Rita Ohara with surgery.   Diabetic: No Tobacco use: non-smoker; however, her husband smoked 3 ppdin their home, he died in 42; and she was exposed  to secondhand smoke at her workplace for 40 years; all men smoked.  Pt meds include:  Statin : No: all statins  tried caused very elevated liver and muscle enzymes and muscle weakness/severe cramping  Betablocker: Yes  ASA: Yes  Other anticoagulants/antiplatelets: no    Past Medical History:  Diagnosis Date  . Anxiety   . Aortic insufficiency    mild due to degenerative changes  . Arthritis   . Basal cell carcinoma 2007   [facility] Left leg  . Carotid artery occlusion   . Chronic kidney disease    CRD Stage 3  . Conjunctivitis due to adenovirus, both eyes   . Degenerative joint disease   . Diverticulosis of colon   . Dyspnea    with exertion  . Heart murmur   . Hyperlipidemia    NMR 07/2009: LDL 200 (2260/1233)TG 99, HDL 65. LDL goal =<120, ideally <100. father MI @ 62  . Hypertension   . Hypothyroidism    Dr Wilson Singer  . Microscopic hematuria   . Peripheral neuropathy    compressive in UE bilaterally; Dr Daylene Katayama  . Peripheral vascular disease ([place])    ICA bilat, Dr.Charles fields, [facility]  . Pneumonia    2017  . PONV (postoperative nausea and vomiting)    "Inner ear, does  okay with Scopolamine"  . Rectocele   . Rocky [place] spotted fever     Past Surgical History:  Procedure Laterality Date  . ABDOMINAL HYSTERECTOMY  1973   age 48 due to dysfuctional menses; HRT x 25-30 years  . APPENDECTOMY  1952  . basal cell cancer  03/2006   leg  . BILATERAL OOPHORECTOMY  1990   prophylactically (sister had ovarian ca)  . CARPAL TUNNEL RELEASE Bilateral 1989   right  . CATARACT EXTRACTION, BILATERAL  2010   Dr Kathrin Penner  . CHOLECYSTECTOMY  2006  . COLONOSCOPY     Dr Ardis Hughs  . EYE SURGERY Left    Bilateral eye (film removed lt eye 01/09/17)  . LUMBAR LAMINECTOMY/DECOMPRESSION MICRODISCECTOMY Left 03/20/2017   Procedure: LEFT LUMBAR FOURLUMBAR FIVE LAMINOTOMY AND MICROICRODISCECTOMY 1 LEVEL;  Surgeon: Jovita Gamma, MD;  Location: [place];  Service: Neurosurgery;  Laterality: Left;  LEFT  . NM MYOVIEW LTD  06/13/2008   low risk scan  . ORTHOPEDIC SURGERY   1989/200/2002/2012   elbows,shoulder surgery x 3, right hand  . RECTOCELE REPAIR  2016  . TONSILLECTOMY  1957  . US ECHOCARDIOGRAPHY  05/01/2010   trace MR,AI,TR;EF =>55%  . VIDEO BRONCHOSCOPY Bilateral 07/17/2017   Procedure: VIDEO BRONCHOSCOPY WITHOUT FLUORO;  Surgeon: Rigoberto Noel, MD;  Location: [facility];  Service: Cardiopulmonary;  Laterality: Bilateral;  . VIDEO BRONCHOSCOPY Bilateral 09/02/2017   Procedure: VIDEO BRONCHOSCOPY WITHOUT FLUORO;  Surgeon: Rigoberto Noel, MD;  Location: [facility];  Service: Cardiopulmonary;  Laterality: Bilateral;    Current Meds  Medication Sig  . acetaminophen (TYLENOL) 325 MG tablet Take 325 mg by mouth every 6 (six) hours as needed for fever or headache.  . b complex vitamins tablet Take 1 tablet by mouth daily after supper.   . cyclobenzaprine (FLEXERIL) 10 MG tablet Take 10 mg by mouth 2 (two) times daily as needed for muscle spasms (scheduled every every).   Marland Kitchen diclofenac sodium (VOLTAREN) 1 % GEL Apply 2-4 g topically 4 (four) times daily as needed (for pain).   . FIBER PO Take 1 tablet by mouth daily.  Marland Kitchen levothyroxine (SYNTHROID) 88 MCG tablet Take 88 mcg  by mouth daily before breakfast. Taking the brand "synthroid"  . LORazepam (ATIVAN) 0.5 MG tablet Take 1 tablet (0.5 mg total) by mouth 3 (three) times daily as needed. (Patient taking differently: Take 0.5 mg by mouth at bedtime. )  . meclizine (ANTIVERT) 25 MG tablet Take 1 tablet (25 mg total) by mouth daily. 1/2 every 8 hrs prn for imbalance (Patient taking differently: Take 12.5-25 mg by mouth at bedtime. )  . metoprolol succinate (TOPROL-XL) 50 MG 24 hr tablet Take 25 mg by mouth 2 (two) times daily after a meal. After breakfast & after supper  . Multiple Vitamins-Minerals (CENTRUM SILVER PO) Take 1 tablet by mouth daily after supper.   . Probiotic Product (PROBIOTIC ADVANCED PO) Take 1 capsule by mouth daily after breakfast. VSL #3  . senna (SENOKOT) 8.6 MG tablet Take 1 tablet by  mouth daily.  . traMADol (ULTRAM) 50 MG tablet 1/2-1 by mouth once daily as needed. (Patient taking differently: Take 50 mg by mouth 2 (two) times daily as needed for moderate pain or severe pain (1 tablet by mouth scheduled each bedtime.). )    12 system ROS was negative unless otherwise noted in HPI   Observations/Objective:  DATA  Carotid Duplex (01-12-19): Right Carotid: Velocities in the right ICA are consistent with a 1-39% stenosis.                Elevated velocity n the distal ICA is likely secondary to a kink                in the vessel. Left Carotid: Velocities in the left ICA are consistent with a 1-39% stenosis.               Elevated velocity in the distal ICA is likely secondary to               tortuosity or suggestive of fibromuscular dysplasia.   ABI Findings (01-12-19): +---------+------------------+-----+---------+--------+ Right    Rt Pressure (mmHg)IndexWaveform Comment  +---------+------------------+-----+---------+--------+ Brachial 132                                      +---------+------------------+-----+---------+--------+ PTA      >254              1.92 triphasic         +---------+------------------+-----+---------+--------+ DP       >254              1.92 triphasic         +---------+------------------+-----+---------+--------+ Great Toe64                0.48 Abnormal          +---------+------------------+-----+---------+--------+  +---------+------------------+-----+---------+-------+ Left     Lt Pressure (mmHg)IndexWaveform Comment +---------+------------------+-----+---------+-------+ Brachial 125                                     +---------+------------------+-----+---------+-------+ PTA      >254              1.92 triphasic        +---------+------------------+-----+---------+-------+ DP       >232              1.76 triphasic         +---------+------------------+-----+---------+-------+ Doristine Church  0.52 Abnormal         +---------+------------------+-----+---------+-------+  +-------+-----------+-----------+------------+------------+ ABI/TBIToday's ABIToday's TBIPrevious ABIPrevious TBI +-------+-----------+-----------+------------+------------+ Right  [place]         0.48       [place]          0.60         +-------+-----------+-----------+------------+------------+ Left   [place]         0.52       [place]          0.98         +-------+-----------+-----------+------------+------------+ Summary: Right: Resting right ankle-brachial index indicates noncompressible right lower extremity arteries.The right toe-brachial index is abnormal. Tibial waveforms suggest adequate perfusion.  Left: Resting left ankle-brachial index indicates noncompressible left lower extremity arteries.The left toe-brachial index is abnormal. Tibial waveforms suggest adequate perfusion.   Bilateral LE Arterial duplex (11/20/17): Right: Focal velocity of 347 cm/s in the mid EIA with post stenotic turbulence, 50-75% stenosis.All monophasic waveforms except at [facility] which is triphasic.  Left:No significant stenosis, bi and triphasic waveforms.     Assessment and Plan: Deborah Shields is a 78 y.o. female who presents with asymptomatic bilateral ICA stenosisc/w fibromuscular dysplasia.  She has peripheral artery occlusive disease with no claudication sx's, no signs of ischemia in her feet or legs other than dependent rubor in both feet on exam at previous encounters, and she states this is still present and that it is no worse.  Her atherosclerotic risk factors include secondhand smoke exposure for 40+ years at works and at home, although she never used tobacco. She also has a statin intolerance. Fortunately she does not have DM.   Review of records: in April 2014 carotid Duplex suggested 60-79% bilateral ICA stenoses.   At her 11-20-17 visit, Dr. Teresita Madura pt bilateral LE arterial duplex results, we discussed her HPI and physical exam results. Dr. Maxie Better with pt. There was a 347 cm/s velocity in her right EIA, her right leg is asymptomatic. No significant stenosis in the arteries of her left leg.  Dr. [name] offered pt an arteriogram to try to determine where stenoses might be, that could possibly be amenable to revascularization. Pt declined at that time, states she will let us know if she changes her mind.   Follow Up Instructions:   Follow up in 1 year with carotid duplex and ABI's.    I discussed the assessment and treatment plan with the patient. The patient was provided an opportunity to ask questions and all were answered. The patient agreed with the plan and demonstrated an understanding of the instructions.   The patient was advised to call back or seek an in-person evaluation if the symptoms worsen or if the condition fails to improve as anticipated.  I spent 10 minutes with the patient via telephone encounter.   Gabrielle Dare Nickel [facility] [place] Office: [phone]  01/14/2019, 9:16 AM

## 2019-02-22 DIAGNOSIS — H01002 Unspecified blepharitis right lower eyelid: Secondary | ICD-10-CM | POA: Diagnosis not present

## 2019-02-22 DIAGNOSIS — H01005 Unspecified blepharitis left lower eyelid: Secondary | ICD-10-CM | POA: Diagnosis not present

## 2019-02-23 ENCOUNTER — Emergency Department (HOSPITAL_COMMUNITY)
Admission: EM | Admit: 2019-02-23 | Discharge: 2019-02-24 | Disposition: A | Discharge status: Home / Self Care | Payer: Medicare Other | Attending: Emergency Medicine | Admitting: Emergency Medicine

## 2019-02-23 ENCOUNTER — Encounter (HOSPITAL_COMMUNITY): Payer: Self-pay | Admitting: Family Medicine

## 2019-02-23 DIAGNOSIS — N183 Chronic kidney disease, stage 3 (moderate): Secondary | ICD-10-CM | POA: Insufficient documentation

## 2019-02-23 DIAGNOSIS — E039 Hypothyroidism, unspecified: Secondary | ICD-10-CM | POA: Insufficient documentation

## 2019-02-23 DIAGNOSIS — S81812A Laceration without foreign body, left lower leg, initial encounter: Secondary | ICD-10-CM | POA: Diagnosis not present

## 2019-02-23 DIAGNOSIS — I129 Hypertensive chronic kidney disease with stage 1 through stage 4 chronic kidney disease, or unspecified chronic kidney disease: Secondary | ICD-10-CM | POA: Insufficient documentation

## 2019-02-23 DIAGNOSIS — Y929 Unspecified place or not applicable: Secondary | ICD-10-CM | POA: Insufficient documentation

## 2019-02-23 DIAGNOSIS — Y999 Unspecified external cause status: Secondary | ICD-10-CM | POA: Insufficient documentation

## 2019-02-23 DIAGNOSIS — S81811A Laceration without foreign body, right lower leg, initial encounter: Secondary | ICD-10-CM | POA: Diagnosis not present

## 2019-02-23 DIAGNOSIS — Y9389 Activity, other specified: Secondary | ICD-10-CM | POA: Diagnosis not present

## 2019-02-23 DIAGNOSIS — W268XXA Contact with other sharp object(s), not elsewhere classified, initial encounter: Secondary | ICD-10-CM | POA: Insufficient documentation

## 2019-02-23 MED ORDER — LIDOCAINE HCL 2 % IJ SOLN
10.0000 mL | Freq: Once | INTRAMUSCULAR | Status: AC
  Administered 2019-02-23: 200 mg
  Filled 2019-02-23: qty 20

## 2019-02-23 NOTE — Discharge Instructions (Addendum)
You were evaluated in the Emergency Department and after careful evaluation, we did not find any emergent condition requiring admission or further testing in the hospital.  Your laceration was repaired here in the emergency department with stitches.  These will need to be removed in 10 to 14 days by a healthcare professional.  Please return to the Emergency Department if you experience any worsening of your condition.  We encourage you to follow up with a primary care provider.  Thank you for allowing [place] to be a part of your care.

## 2019-02-23 NOTE — ED Triage Notes (Signed)
Patient has a laceration to her right lower leg from a letter opener that was in her recliner unknown. Patient denies being on blood thinners. Patient is ambulatory to triage.

## 2019-02-23 NOTE — ED Notes (Signed)
Suture Cart at bedside.  

## 2019-02-23 NOTE — ED Provider Notes (Signed)
[facility] Emergency Department Provider Note MRN:  [id number]  Arrival date & time: 02/23/19     Chief Complaint   Laceration   History of Present Illness   Deborah Shields is a 78 y.o. year-old female with a history of CKD, hypertension presenting to the ED with chief complaint of laceration.  Patient was going through the mail today and accidentally left out the letter opener.  Tried to sit down on the chair and accidentally got her right leg with the letter opener.  Had trouble controlling the bleeding.  Denies any other injuries, no fall, no head trauma.  Tetanus up-to-date.  Pain is mild, constant, worse with motion or palpation.  Review of Systems  A complete 10 system review of systems was obtained and all systems are negative except as noted in the HPI and PMH.   Patient's Health History    Past Medical History:  Diagnosis Date  . Anxiety   . Aortic insufficiency    mild due to degenerative changes  . Arthritis   . Basal cell carcinoma 2007   [facility] Left leg  . Carotid artery occlusion   . Chronic kidney disease    CRD Stage 3  . Conjunctivitis due to adenovirus, both eyes   . Degenerative joint disease   . Diverticulosis of colon   . Dyspnea    with exertion  . Heart murmur   . Hyperlipidemia    NMR 07/2009: LDL 200 (2260/1233)TG 99, HDL 65. LDL goal =<120, ideally <100. father MI @ 23  . Hypertension   . Hypothyroidism    Dr Wilson Singer  . Microscopic hematuria   . Peripheral neuropathy    compressive in UE bilaterally; Dr Daylene Katayama  . Peripheral vascular disease ([place])    ICA bilat, Dr.Charles fields, [facility]  . Pneumonia    2017  . PONV (postoperative nausea and vomiting)    "Inner ear, does  okay with Scopolamine"  . Rectocele   . [place] spotted fever     Past Surgical History:  Procedure Laterality Date  . ABDOMINAL HYSTERECTOMY  1973   age 50 due to dysfuctional menses; HRT x 25-30 years  . APPENDECTOMY  1952  .  basal cell cancer  03/2006   leg  . BILATERAL OOPHORECTOMY  1990   prophylactically (sister had ovarian ca)  . CARPAL TUNNEL RELEASE Bilateral 1989   right  . CATARACT EXTRACTION, BILATERAL  2010   Dr Kathrin Penner  . CHOLECYSTECTOMY  2006  . COLONOSCOPY     Dr Ardis Hughs  . EYE SURGERY Left    Bilateral eye (film removed lt eye 01/09/17)  . LUMBAR LAMINECTOMY/DECOMPRESSION MICRODISCECTOMY Left 03/20/2017   Procedure: LEFT LUMBAR FOURLUMBAR FIVE LAMINOTOMY AND MICROICRODISCECTOMY 1 LEVEL;  Surgeon: Jovita Gamma, MD;  Location: [facility];  Service: Neurosurgery;  Laterality: Left;  LEFT  . NM MYOVIEW LTD  06/13/2008   low risk scan  . ORTHOPEDIC SURGERY  1989/200/2002/2012   elbows,shoulder surgery x 3, right hand  . RECTOCELE REPAIR  2016  . TONSILLECTOMY  1957  . US ECHOCARDIOGRAPHY  05/01/2010   trace MR,AI,TR;EF =>55%  . VIDEO BRONCHOSCOPY Bilateral 07/17/2017   Procedure: VIDEO BRONCHOSCOPY WITHOUT FLUORO;  Surgeon: Rigoberto Noel, MD;  Location: [facility];  Service: Cardiopulmonary;  Laterality: Bilateral;  . VIDEO BRONCHOSCOPY Bilateral 09/02/2017   Procedure: VIDEO BRONCHOSCOPY WITHOUT FLUORO;  Surgeon: Rigoberto Noel, MD;  Location: [facility];  Service: Cardiopulmonary;  Laterality: Bilateral;    Family  History  Problem Relation Age of Onset  . Heart attack Father 42  . Heart disease Father        Before age 60  . Colon cancer Mother 39  . Stroke Mother 83  . Kidney disease Mother        ? renal calculi; S/P resecton of kidney  . Cancer - Colon Mother 22  . Ovarian cancer Sister   . Other Sister        Valve replacement (aortic & mitral ) in 2 sisters  . Heart disease Sister        Before age 74  . Heart disease Brother        aortic & mitral valve replacement in 2 bro; both had CBAG  . Diabetes Neg Hx   . Breast cancer Neg Hx     Social History   Socioeconomic History  . Marital status: Widowed    Spouse name: Not on file  . Number of children: 1  . Years of  education: Not on file  . Highest education level: Not on file  Occupational History  . Occupation: retired  Scientific laboratory technician  . Financial resource strain: Not on file  . Food insecurity    Worry: Not on file    Inability: Not on file  . Transportation needs    Medical: Not on file    Non-medical: Not on file  Tobacco Use  . Smoking status: Never Smoker  . Smokeless tobacco: Never Used  Substance and Sexual Activity  . Alcohol use: No    Alcohol/week: 0.0 standard drinks  . Drug use: No  . Sexual activity: Not on file  Lifestyle  . Physical activity    Days per week: Not on file    Minutes per session: Not on file  . Stress: Not on file  Relationships  . Social Herbalist on phone: Not on file    Gets together: Not on file    Attends religious service: Not on file    Active member of club or organization: Not on file    Attends meetings of clubs or organizations: Not on file    Relationship status: Not on file  . Intimate partner violence    Fear of current or ex partner: Not on file    Emotionally abused: Not on file    Physically abused: Not on file    Forced sexual activity: Not on file  Other Topics Concern  . Not on file  Social History Narrative   Low cholesterol diet   Regular exercise- yes      Physical Exam  Vital Signs and Nursing Notes reviewed Vitals:   02/23/19 2221 02/23/19 2222  BP: (!) 190/96   Pulse: 68 68  Resp: 18 19  Temp: 98.1 F (36.7 C)   SpO2: 100% 100%    CONSTITUTIONAL: Well-appearing, NAD NEURO:  Alert and oriented x 3, no focal deficits EYES:  eyes equal and reactive ENT/NECK:  no LAD, no JVD CARDIO: Regular rate, well-perfused, normal S1 and S2 PULM:  CTAB no wheezing or rhonchi GI/GU:  normal bowel sounds, non-distended, non-tender MSK/SPINE:  No gross deformities, no edema SKIN:  no rash; flap laceration to the right lateral distal calf PSYCH:  Appropriate speech and behavior  Diagnostic and Interventional Summary     Labs Reviewed - No data to display  No orders to display    Medications  lidocaine (XYLOCAINE) 2 % (with pres) injection 200 mg (has  no administration in time range)     .Marland KitchenLaceration Repair  Date/Time: 02/23/2019 11:39 PM Performed by: Maudie Flakes, MD Authorized by: Maudie Flakes, MD   Consent:    Consent obtained:  Verbal   Consent given by:  Patient   Risks discussed:  Infection, pain, poor cosmetic result and poor wound healing   Alternatives discussed:  No treatment Anesthesia (see MAR for exact dosages):    Anesthesia method:  Local infiltration   Local anesthetic:  Lidocaine 2% w/o epi Laceration details:    Location:  Leg   Leg location:  R lower leg   Length (cm):  6   Depth (mm):  3 Repair type:    Repair type:  Simple Pre-procedure details:    Preparation:  Patient was prepped and draped in usual sterile fashion Exploration:    Wound exploration: wound explored through full range of motion and entire depth of wound probed and visualized     Contaminated: no   Treatment:    Area cleansed with:  Saline   Amount of cleaning:  Standard Skin repair:    Repair method:  Sutures   Suture size:  4-0   Suture material:  Prolene   Suture technique:  Simple interrupted   Number of sutures:  9 Approximation:    Approximation:  Close Post-procedure details:    Dressing:  Non-adherent dressing   Patient tolerance of procedure:  Tolerated well, no immediate complications Comments:     Flap laceration is each arm measuring 3 cm.   Critical Care  ED Course and Medical Decision Making  I have reviewed the triage vital signs and the nursing notes.  Pertinent labs & imaging results that were available during my care of the patient were reviewed by me and considered in my medical decision making (see below for details).  Repaired as described above, appropriate for discharge.  The flap of the laceration was showing some signs of poor perfusion and may not  survive the laceration.  Explained this to patient, that the flap may become a scab and cause a larger scar if it does not heal.  Advised nonadherent dressings if this occurs.   Barth Kirks. Sedonia Small, [place] [email or web address]  Final Clinical Impressions(s) / ED Diagnoses     ICD-10-CM   1. Leg laceration, right, initial encounter  [phone]     ED Discharge Orders    None      Discharge Instructions Discussed with and Provided to Patient:   Discharge Instructions     You were evaluated in the Emergency Department and after careful evaluation, we did not find any emergent condition requiring admission or further testing in the hospital.  Your laceration was repaired here in the emergency department with stitches.  These will need to be removed in 10 to 14 days by a healthcare professional.  Please return to the Emergency Department if you experience any worsening of your condition.  We encourage you to follow up with a primary care provider.  Thank you for allowing [place] to be a part of your care.        Maudie Flakes, MD 02/23/19 2340

## 2019-03-09 DIAGNOSIS — Z23 Encounter for immunization: Secondary | ICD-10-CM | POA: Diagnosis not present

## 2019-03-09 DIAGNOSIS — S81801A Unspecified open wound, right lower leg, initial encounter: Secondary | ICD-10-CM | POA: Diagnosis not present

## 2019-03-15 DIAGNOSIS — L03115 Cellulitis of right lower limb: Secondary | ICD-10-CM | POA: Diagnosis not present

## 2019-03-18 DIAGNOSIS — I129 Hypertensive chronic kidney disease with stage 1 through stage 4 chronic kidney disease, or unspecified chronic kidney disease: Secondary | ICD-10-CM | POA: Diagnosis not present

## 2019-03-18 DIAGNOSIS — S81801D Unspecified open wound, right lower leg, subsequent encounter: Secondary | ICD-10-CM | POA: Diagnosis not present

## 2019-03-18 DIAGNOSIS — N183 Chronic kidney disease, stage 3 (moderate): Secondary | ICD-10-CM | POA: Diagnosis not present

## 2019-03-29 DIAGNOSIS — S81801D Unspecified open wound, right lower leg, subsequent encounter: Secondary | ICD-10-CM | POA: Diagnosis not present

## 2019-04-12 DIAGNOSIS — D692 Other nonthrombocytopenic purpura: Secondary | ICD-10-CM | POA: Diagnosis not present

## 2019-04-12 DIAGNOSIS — S81801D Unspecified open wound, right lower leg, subsequent encounter: Secondary | ICD-10-CM | POA: Diagnosis not present

## 2019-04-12 DIAGNOSIS — N1831 Chronic kidney disease, stage 3a: Secondary | ICD-10-CM | POA: Diagnosis not present

## 2019-04-12 DIAGNOSIS — I129 Hypertensive chronic kidney disease with stage 1 through stage 4 chronic kidney disease, or unspecified chronic kidney disease: Secondary | ICD-10-CM | POA: Diagnosis not present

## 2019-04-12 DIAGNOSIS — Z23 Encounter for immunization: Secondary | ICD-10-CM | POA: Diagnosis not present

## 2019-04-26 DIAGNOSIS — S81801D Unspecified open wound, right lower leg, subsequent encounter: Secondary | ICD-10-CM | POA: Diagnosis not present

## 2019-05-24 ENCOUNTER — Other Ambulatory Visit: Payer: Self-pay | Admitting: Geriatric Medicine

## 2019-05-24 DIAGNOSIS — Z1231 Encounter for screening mammogram for malignant neoplasm of breast: Secondary | ICD-10-CM

## 2019-05-25 DIAGNOSIS — N1832 Chronic kidney disease, stage 3b: Secondary | ICD-10-CM | POA: Diagnosis not present

## 2019-05-25 DIAGNOSIS — I129 Hypertensive chronic kidney disease with stage 1 through stage 4 chronic kidney disease, or unspecified chronic kidney disease: Secondary | ICD-10-CM | POA: Diagnosis not present

## 2019-05-25 DIAGNOSIS — S81801D Unspecified open wound, right lower leg, subsequent encounter: Secondary | ICD-10-CM | POA: Diagnosis not present

## 2019-07-20 ENCOUNTER — Other Ambulatory Visit: Payer: Self-pay

## 2019-07-20 ENCOUNTER — Ambulatory Visit
Admission: RE | Admit: 2019-07-20 | Discharge: 2019-07-20 | Disposition: A | Discharge status: Home / Self Care | Payer: Medicare Other | Source: Ambulatory Visit | Attending: Geriatric Medicine | Admitting: Geriatric Medicine

## 2019-07-20 DIAGNOSIS — Z1231 Encounter for screening mammogram for malignant neoplasm of breast: Secondary | ICD-10-CM | POA: Diagnosis not present

## 2019-07-20 DIAGNOSIS — I700 Atherosclerosis of aorta: Secondary | ICD-10-CM | POA: Diagnosis not present

## 2019-07-20 DIAGNOSIS — I129 Hypertensive chronic kidney disease with stage 1 through stage 4 chronic kidney disease, or unspecified chronic kidney disease: Secondary | ICD-10-CM | POA: Diagnosis not present

## 2019-07-20 DIAGNOSIS — N1832 Chronic kidney disease, stage 3b: Secondary | ICD-10-CM | POA: Diagnosis not present

## 2019-07-20 DIAGNOSIS — E7800 Pure hypercholesterolemia, unspecified: Secondary | ICD-10-CM | POA: Diagnosis not present

## 2019-07-20 DIAGNOSIS — I480 Paroxysmal atrial fibrillation: Secondary | ICD-10-CM | POA: Diagnosis not present

## 2019-07-20 DIAGNOSIS — I70209 Unspecified atherosclerosis of native arteries of extremities, unspecified extremity: Secondary | ICD-10-CM | POA: Diagnosis not present

## 2019-07-20 DIAGNOSIS — J449 Chronic obstructive pulmonary disease, unspecified: Secondary | ICD-10-CM | POA: Diagnosis not present

## 2019-08-28 ENCOUNTER — Other Ambulatory Visit: Payer: Self-pay

## 2019-08-28 ENCOUNTER — Encounter (HOSPITAL_COMMUNITY): Payer: Self-pay

## 2019-08-28 ENCOUNTER — Emergency Department (HOSPITAL_COMMUNITY): Payer: Medicare Other

## 2019-08-28 ENCOUNTER — Inpatient Hospital Stay (HOSPITAL_COMMUNITY)
Admission: EM | Admit: 2019-08-28 | Discharge: 2019-09-07 | DRG: 982 | Disposition: A | Discharge status: Home Health | Payer: Medicare Other | Attending: Internal Medicine | Admitting: Internal Medicine

## 2019-08-28 DIAGNOSIS — I4891 Unspecified atrial fibrillation: Secondary | ICD-10-CM

## 2019-08-28 DIAGNOSIS — J449 Chronic obstructive pulmonary disease, unspecified: Secondary | ICD-10-CM | POA: Diagnosis not present

## 2019-08-28 DIAGNOSIS — Z823 Family history of stroke: Secondary | ICD-10-CM

## 2019-08-28 DIAGNOSIS — I4892 Unspecified atrial flutter: Secondary | ICD-10-CM | POA: Diagnosis present

## 2019-08-28 DIAGNOSIS — E785 Hyperlipidemia, unspecified: Secondary | ICD-10-CM | POA: Diagnosis not present

## 2019-08-28 DIAGNOSIS — E1165 Type 2 diabetes mellitus with hyperglycemia: Secondary | ICD-10-CM | POA: Diagnosis not present

## 2019-08-28 DIAGNOSIS — N1832 Chronic kidney disease, stage 3b: Secondary | ICD-10-CM | POA: Diagnosis not present

## 2019-08-28 DIAGNOSIS — Z20822 Contact with and (suspected) exposure to covid-19: Secondary | ICD-10-CM | POA: Diagnosis present

## 2019-08-28 DIAGNOSIS — I480 Paroxysmal atrial fibrillation: Secondary | ICD-10-CM | POA: Diagnosis not present

## 2019-08-28 DIAGNOSIS — T17890A Other foreign object in other parts of respiratory tract causing asphyxiation, initial encounter: Secondary | ICD-10-CM | POA: Diagnosis present

## 2019-08-28 DIAGNOSIS — J439 Emphysema, unspecified: Secondary | ICD-10-CM | POA: Diagnosis present

## 2019-08-28 DIAGNOSIS — J189 Pneumonia, unspecified organism: Principal | ICD-10-CM | POA: Diagnosis present

## 2019-08-28 DIAGNOSIS — I352 Nonrheumatic aortic (valve) stenosis with insufficiency: Secondary | ICD-10-CM | POA: Diagnosis present

## 2019-08-28 DIAGNOSIS — Z886 Allergy status to analgesic agent status: Secondary | ICD-10-CM

## 2019-08-28 DIAGNOSIS — R1310 Dysphagia, unspecified: Secondary | ICD-10-CM | POA: Diagnosis present

## 2019-08-28 DIAGNOSIS — I129 Hypertensive chronic kidney disease with stage 1 through stage 4 chronic kidney disease, or unspecified chronic kidney disease: Secondary | ICD-10-CM | POA: Diagnosis present

## 2019-08-28 DIAGNOSIS — E782 Mixed hyperlipidemia: Secondary | ICD-10-CM | POA: Diagnosis present

## 2019-08-28 DIAGNOSIS — Z950 Presence of cardiac pacemaker: Secondary | ICD-10-CM

## 2019-08-28 DIAGNOSIS — Z841 Family history of disorders of kidney and ureter: Secondary | ICD-10-CM

## 2019-08-28 DIAGNOSIS — Z86711 Personal history of pulmonary embolism: Secondary | ICD-10-CM

## 2019-08-28 DIAGNOSIS — Z79899 Other long term (current) drug therapy: Secondary | ICD-10-CM

## 2019-08-28 DIAGNOSIS — Z8041 Family history of malignant neoplasm of ovary: Secondary | ICD-10-CM

## 2019-08-28 DIAGNOSIS — I10 Essential (primary) hypertension: Secondary | ICD-10-CM | POA: Diagnosis present

## 2019-08-28 DIAGNOSIS — I739 Peripheral vascular disease, unspecified: Secondary | ICD-10-CM | POA: Diagnosis present

## 2019-08-28 DIAGNOSIS — I495 Sick sinus syndrome: Secondary | ICD-10-CM | POA: Diagnosis not present

## 2019-08-28 DIAGNOSIS — F419 Anxiety disorder, unspecified: Secondary | ICD-10-CM | POA: Diagnosis present

## 2019-08-28 DIAGNOSIS — Z85828 Personal history of other malignant neoplasm of skin: Secondary | ICD-10-CM

## 2019-08-28 DIAGNOSIS — Z888 Allergy status to other drugs, medicaments and biological substances status: Secondary | ICD-10-CM | POA: Diagnosis not present

## 2019-08-28 DIAGNOSIS — Z9049 Acquired absence of other specified parts of digestive tract: Secondary | ICD-10-CM

## 2019-08-28 DIAGNOSIS — R7989 Other specified abnormal findings of blood chemistry: Secondary | ICD-10-CM | POA: Diagnosis not present

## 2019-08-28 DIAGNOSIS — Z7982 Long term (current) use of aspirin: Secondary | ICD-10-CM

## 2019-08-28 DIAGNOSIS — Z90710 Acquired absence of both cervix and uterus: Secondary | ICD-10-CM | POA: Diagnosis not present

## 2019-08-28 DIAGNOSIS — Z9841 Cataract extraction status, right eye: Secondary | ICD-10-CM

## 2019-08-28 DIAGNOSIS — Z8249 Family history of ischemic heart disease and other diseases of the circulatory system: Secondary | ICD-10-CM

## 2019-08-28 DIAGNOSIS — X58XXXA Exposure to other specified factors, initial encounter: Secondary | ICD-10-CM | POA: Diagnosis present

## 2019-08-28 DIAGNOSIS — R0602 Shortness of breath: Secondary | ICD-10-CM | POA: Diagnosis not present

## 2019-08-28 DIAGNOSIS — R042 Hemoptysis: Secondary | ICD-10-CM | POA: Diagnosis not present

## 2019-08-28 DIAGNOSIS — I350 Nonrheumatic aortic (valve) stenosis: Secondary | ICD-10-CM | POA: Diagnosis not present

## 2019-08-28 DIAGNOSIS — Z79890 Hormone replacement therapy (postmenopausal): Secondary | ICD-10-CM

## 2019-08-28 DIAGNOSIS — Z90722 Acquired absence of ovaries, bilateral: Secondary | ICD-10-CM | POA: Diagnosis not present

## 2019-08-28 DIAGNOSIS — Z91048 Other nonmedicinal substance allergy status: Secondary | ICD-10-CM

## 2019-08-28 DIAGNOSIS — Z45018 Encounter for adjustment and management of other part of cardiac pacemaker: Secondary | ICD-10-CM | POA: Diagnosis not present

## 2019-08-28 DIAGNOSIS — R079 Chest pain, unspecified: Secondary | ICD-10-CM | POA: Diagnosis not present

## 2019-08-28 DIAGNOSIS — E039 Hypothyroidism, unspecified: Secondary | ICD-10-CM | POA: Diagnosis present

## 2019-08-28 DIAGNOSIS — Z800 Family history of malignant neoplasm of digestive organs: Secondary | ICD-10-CM

## 2019-08-28 DIAGNOSIS — M1990 Unspecified osteoarthritis, unspecified site: Secondary | ICD-10-CM | POA: Diagnosis present

## 2019-08-28 DIAGNOSIS — Z9842 Cataract extraction status, left eye: Secondary | ICD-10-CM | POA: Diagnosis not present

## 2019-08-28 DIAGNOSIS — B379 Candidiasis, unspecified: Secondary | ICD-10-CM | POA: Diagnosis present

## 2019-08-28 DIAGNOSIS — Z8614 Personal history of Methicillin resistant Staphylococcus aureus infection: Secondary | ICD-10-CM

## 2019-08-28 DIAGNOSIS — J4489 Other specified chronic obstructive pulmonary disease: Secondary | ICD-10-CM | POA: Diagnosis present

## 2019-08-28 DIAGNOSIS — I351 Nonrheumatic aortic (valve) insufficiency: Secondary | ICD-10-CM | POA: Diagnosis not present

## 2019-08-28 DIAGNOSIS — Z79891 Long term (current) use of opiate analgesic: Secondary | ICD-10-CM

## 2019-08-28 DIAGNOSIS — T447X6A Underdosing of beta-adrenoreceptor antagonists, initial encounter: Secondary | ICD-10-CM | POA: Diagnosis present

## 2019-08-28 DIAGNOSIS — I6523 Occlusion and stenosis of bilateral carotid arteries: Secondary | ICD-10-CM | POA: Diagnosis present

## 2019-08-28 DIAGNOSIS — G629 Polyneuropathy, unspecified: Secondary | ICD-10-CM | POA: Diagnosis present

## 2019-08-28 HISTORY — DX: Other pulmonary embolism without acute cor pulmonale: I26.99

## 2019-08-28 LAB — STREP PNEUMONIAE URINARY ANTIGEN: Strep Pneumo Urinary Antigen: NEGATIVE

## 2019-08-28 LAB — PROTIME-INR
INR: 1 (ref 0.8–1.2)
Prothrombin Time: 12.8 seconds (ref 11.4–15.2)

## 2019-08-28 LAB — BASIC METABOLIC PANEL
Anion gap: 8 (ref 5–15)
BUN: 32 mg/dL — ABNORMAL HIGH (ref 8–23)
CO2: 28 mmol/L (ref 22–32)
Calcium: 8.9 mg/dL (ref 8.9–10.3)
Chloride: 104 mmol/L (ref 98–111)
Creatinine, Ser: 1.46 mg/dL — ABNORMAL HIGH (ref 0.44–1.00)
GFR calc Af Amer: 39 mL/min — ABNORMAL LOW (ref >60)
GFR calc non Af Amer: 34 mL/min — ABNORMAL LOW (ref >60)
Glucose, Bld: 97 mg/dL (ref 70–99)
Potassium: 3.6 mmol/L (ref 3.5–5.1)
Sodium: 140 mmol/L (ref 135–145)

## 2019-08-28 LAB — APTT: aPTT: 34 seconds (ref 24–36)

## 2019-08-28 LAB — CBC
HCT: 38.7 % (ref 36.0–46.0)
Hemoglobin: 12.3 g/dL (ref 12.0–15.0)
MCH: 29.6 pg (ref 26.0–34.0)
MCHC: 31.8 g/dL (ref 30.0–36.0)
MCV: 93.3 fL (ref 80.0–100.0)
Platelets: 257 10*3/uL (ref 150–400)
RBC: 4.15 MIL/uL (ref 3.87–5.11)
RDW: 13.2 % (ref 11.5–15.5)
WBC: 6.5 10*3/uL (ref 4.0–10.5)
nRBC: 0 % (ref 0.0–0.2)

## 2019-08-28 LAB — TYPE AND SCREEN
ABO/RH(D): A POS
Antibody Screen: NEGATIVE

## 2019-08-28 LAB — HEPATIC FUNCTION PANEL
ALT: 15 U/L (ref 0–44)
AST: 27 U/L (ref 15–41)
Albumin: 3.5 g/dL (ref 3.5–5.0)
Alkaline Phosphatase: 90 U/L (ref 38–126)
Bilirubin, Direct: 0.1 mg/dL (ref 0.0–0.2)
Indirect Bilirubin: 1 mg/dL — ABNORMAL HIGH (ref 0.3–0.9)
Total Bilirubin: 1.1 mg/dL (ref 0.3–1.2)
Total Protein: 7 g/dL (ref 6.5–8.1)

## 2019-08-28 LAB — MRSA PCR SCREENING: MRSA by PCR: POSITIVE — AB

## 2019-08-28 LAB — TROPONIN I (HIGH SENSITIVITY)
Troponin I (High Sensitivity): 18 ng/L — ABNORMAL HIGH (ref <18)
Troponin I (High Sensitivity): 17 ng/L (ref <18)

## 2019-08-28 MED ORDER — ONDANSETRON HCL 4 MG/2ML IJ SOLN: 4.0000 mg | Freq: Four times a day (QID) | INTRAMUSCULAR | Status: DC | PRN

## 2019-08-28 MED ORDER — MORPHINE SULFATE (PF) 2 MG/ML IV SOLN
2.0000 mg | Freq: Once | INTRAVENOUS | Status: AC
  Administered 2019-08-28: 2 mg via INTRAVENOUS
  Filled 2019-08-28: qty 1

## 2019-08-28 MED ORDER — ACETAMINOPHEN 325 MG PO TABS: 325.0000 mg | ORAL_TABLET | Freq: Four times a day (QID) | ORAL | Status: DC | PRN

## 2019-08-28 MED ORDER — DILTIAZEM LOAD VIA INFUSION
10.0000 mg | Freq: Once | INTRAVENOUS | Status: AC
  Administered 2019-08-28: 16:00:00 10 mg via INTRAVENOUS
  Filled 2019-08-28: qty 10

## 2019-08-28 MED ORDER — ADULT MULTIVITAMIN W/MINERALS CH
1.0000 | ORAL_TABLET | Freq: Every day | ORAL | Status: DC
  Administered 2019-08-28 – 2019-09-06 (×10): 1 via ORAL
  Filled 2019-08-28 (×10): qty 1

## 2019-08-28 MED ORDER — CHLORHEXIDINE GLUCONATE CLOTH 2 % EX PADS
6.0000 | MEDICATED_PAD | Freq: Every day | CUTANEOUS | Status: DC
  Administered 2019-08-28 – 2019-09-04 (×6): 6 via TOPICAL

## 2019-08-28 MED ORDER — GUAIFENESIN-DM 100-10 MG/5ML PO SYRP: 5.0000 mL | ORAL_SOLUTION | ORAL | Status: DC | PRN

## 2019-08-28 MED ORDER — CYCLOBENZAPRINE HCL 10 MG PO TABS
10.0000 mg | ORAL_TABLET | Freq: Two times a day (BID) | ORAL | Status: DC | PRN
  Administered 2019-08-28 – 2019-08-29 (×2): 10 mg via ORAL
  Filled 2019-08-28 (×3): qty 1

## 2019-08-28 MED ORDER — LEVOTHYROXINE SODIUM 88 MCG PO TABS
88.0000 ug | ORAL_TABLET | Freq: Every day | ORAL | Status: DC
  Administered 2019-08-29 – 2019-08-30 (×2): 88 ug via ORAL
  Filled 2019-08-28 (×2): qty 1

## 2019-08-28 MED ORDER — B COMPLEX-C PO TABS
1.0000 | ORAL_TABLET | Freq: Every day | ORAL | Status: DC
  Administered 2019-08-28 – 2019-09-06 (×10): 1 via ORAL
  Filled 2019-08-28 (×10): qty 1

## 2019-08-28 MED ORDER — SODIUM CHLORIDE 0.9 % IV SOLN
1.0000 g | Freq: Every day | INTRAVENOUS | Status: DC
  Administered 2019-08-28 – 2019-08-30 (×3): 1 g via INTRAVENOUS
  Filled 2019-08-28: qty 10
  Filled 2019-08-28 (×2): qty 1

## 2019-08-28 MED ORDER — IPRATROPIUM-ALBUTEROL 0.5-2.5 (3) MG/3ML IN SOLN
3.0000 mL | Freq: Four times a day (QID) | RESPIRATORY_TRACT | Status: DC
  Filled 2019-08-28: qty 3

## 2019-08-28 MED ORDER — HYDROCOD POLST-CPM POLST ER 10-8 MG/5ML PO SUER
5.0000 mL | Freq: Two times a day (BID) | ORAL | Status: DC
  Administered 2019-08-28 – 2019-09-07 (×10): 5 mL via ORAL
  Filled 2019-08-28 (×18): qty 5

## 2019-08-28 MED ORDER — METOPROLOL SUCCINATE ER 25 MG PO TB24
25.0000 mg | ORAL_TABLET | Freq: Two times a day (BID) | ORAL | Status: DC
  Administered 2019-08-28 – 2019-08-29 (×3): 25 mg via ORAL
  Filled 2019-08-28 (×4): qty 1

## 2019-08-28 MED ORDER — MUPIROCIN 2 % EX OINT
1.0000 "application " | TOPICAL_OINTMENT | Freq: Two times a day (BID) | CUTANEOUS | Status: AC
  Administered 2019-08-28 – 2019-09-02 (×10): 1 via NASAL
  Filled 2019-08-28 (×4): qty 22

## 2019-08-28 MED ORDER — ACETAMINOPHEN 325 MG PO TABS
650.0000 mg | ORAL_TABLET | Freq: Four times a day (QID) | ORAL | Status: DC | PRN
  Administered 2019-09-02: 650 mg via ORAL
  Filled 2019-08-28 (×2): qty 2

## 2019-08-28 MED ORDER — ONDANSETRON HCL 4 MG PO TABS: 4.0000 mg | ORAL_TABLET | Freq: Four times a day (QID) | ORAL | Status: DC | PRN

## 2019-08-28 MED ORDER — ACETAMINOPHEN 650 MG RE SUPP: 650.0000 mg | Freq: Four times a day (QID) | RECTAL | Status: DC | PRN

## 2019-08-28 MED ORDER — SENNA 8.6 MG PO TABS
1.0000 | ORAL_TABLET | Freq: Every day | ORAL | Status: DC
  Administered 2019-08-28 – 2019-09-07 (×8): 8.6 mg via ORAL
  Filled 2019-08-28 (×9): qty 1

## 2019-08-28 MED ORDER — SODIUM CHLORIDE 0.9 % IV BOLUS
500.0000 mL | Freq: Once | INTRAVENOUS | Status: AC
  Administered 2019-08-28: 13:00:00 500 mL via INTRAVENOUS

## 2019-08-28 MED ORDER — ALBUTEROL SULFATE (2.5 MG/3ML) 0.083% IN NEBU: 3.0000 mL | INHALATION_SOLUTION | RESPIRATORY_TRACT | Status: DC | PRN

## 2019-08-28 MED ORDER — SODIUM CHLORIDE (PF) 0.9 % IJ SOLN
INTRAMUSCULAR | Status: AC
  Filled 2019-08-28: qty 50

## 2019-08-28 MED ORDER — VANCOMYCIN HCL IN DEXTROSE 1-5 GM/200ML-% IV SOLN
1000.0000 mg | Freq: Once | INTRAVENOUS | Status: AC
  Administered 2019-08-28: 1000 mg via INTRAVENOUS
  Filled 2019-08-28: qty 200

## 2019-08-28 MED ORDER — IOHEXOL 350 MG/ML SOLN
70.0000 mL | Freq: Once | INTRAVENOUS | Status: AC | PRN
  Administered 2019-08-28: 100 mL via INTRAVENOUS

## 2019-08-28 MED ORDER — TRAMADOL HCL 50 MG PO TABS
50.0000 mg | ORAL_TABLET | Freq: Two times a day (BID) | ORAL | Status: DC | PRN
  Administered 2019-08-29 – 2019-09-06 (×3): 50 mg via ORAL
  Filled 2019-08-28 (×4): qty 1

## 2019-08-28 MED ORDER — RISAQUAD PO CAPS
ORAL_CAPSULE | Freq: Every day | ORAL | Status: DC
  Administered 2019-08-29 – 2019-09-07 (×4): 1 via ORAL
  Filled 2019-08-28 (×9): qty 1

## 2019-08-28 MED ORDER — SODIUM CHLORIDE 0.9 % IV SOLN: 1.0000 g | Freq: Once | INTRAVENOUS | Status: DC

## 2019-08-28 MED ORDER — DILTIAZEM HCL-DEXTROSE 125-5 MG/125ML-% IV SOLN (PREMIX)
5.0000 mg/h | INTRAVENOUS | Status: DC
  Administered 2019-08-28: 5 mg/h via INTRAVENOUS
  Filled 2019-08-28: qty 125

## 2019-08-28 MED ORDER — LORAZEPAM 0.5 MG PO TABS
0.5000 mg | ORAL_TABLET | Freq: Every day | ORAL | Status: DC
  Administered 2019-08-28 – 2019-09-06 (×10): 0.5 mg via ORAL
  Filled 2019-08-28 (×10): qty 1

## 2019-08-28 MED ORDER — CHLORHEXIDINE GLUCONATE CLOTH 2 % EX PADS
6.0000 | MEDICATED_PAD | Freq: Every day | CUTANEOUS | Status: AC
  Administered 2019-08-29 – 2019-08-31 (×3): 6 via TOPICAL

## 2019-08-28 MED ORDER — MECLIZINE HCL 12.5 MG PO TABS
12.5000 mg | ORAL_TABLET | Freq: Every day | ORAL | Status: DC
  Administered 2019-08-28: 25 mg via ORAL
  Administered 2019-08-29 – 2019-08-30 (×2): 12.5 mg via ORAL
  Administered 2019-08-31 – 2019-09-06 (×7): 25 mg via ORAL
  Filled 2019-08-28 (×9): qty 2
  Filled 2019-08-28: qty 1
  Filled 2019-08-28 (×3): qty 2

## 2019-08-28 MED ORDER — LABETALOL HCL 5 MG/ML IV SOLN: 5.0000 mg | Freq: Once | INTRAVENOUS | Status: DC

## 2019-08-28 MED ORDER — AZITHROMYCIN 250 MG PO TABS
500.0000 mg | ORAL_TABLET | Freq: Every day | ORAL | Status: AC
  Administered 2019-08-28: 500 mg via ORAL
  Filled 2019-08-28: qty 2

## 2019-08-28 NOTE — ED Provider Notes (Signed)
Care assumed from Reid Hope King, please see his note for full details, but in brief Deborah Shields is a 79 y.o. female who presents with hemoptysis.  Episodes of hemoptysis began at 1:30 AM, and because she reports she has had an intermittent cough since last night with increasing amounts of hemoptysis that she now describes as a mouthful of blood.  She denies associated chest pain or shortness of breath.  Lab work shows no leukocytosis, slight bump in patient's creatinine but no other significant electrolyte derangements, normal LFTs, normal coags.  Chest x-ray is clear.  Covid test is pending.  CTA is pending at time of shift change.  Patient also found to be in A. fib with RVR which developed during ED stay.  Chart review reveals remote history of paroxysmal A. fib.  Patient will be started on Cardizem load and infusion, will hold off on any anticoagulation given current hemoptysis.  Plan: Follow-up on CTA, plan for admission.   Physical Exam  BP (!) 186/115   Pulse 77   Temp (!) 97.5 F (36.4 C) (Oral)   Resp 16   Ht 5' 6.5" (1.689 m)   Wt 51.7 kg   SpO2 94%   BMI 18.12 kg/m   Physical Exam Vitals and nursing note reviewed.  Constitutional:      General: She is not in acute distress.    Appearance: She is well-developed. She is not diaphoretic.     Comments: Sitting up, alert and able to answer all questions, in no distress  HENT:     Head: Normocephalic and atraumatic.  Eyes:     General:        Right eye: No discharge.        Left eye: No discharge.  Cardiovascular:     Rate and Rhythm: Tachycardia present. Rhythm irregular.     Comments: A. fib with RVR noted on monitor Pulmonary:     Effort: Pulmonary effort is normal. No respiratory distress.     Comments: No active coughing or hemoptysis, no respiratory distress Neurological:     Mental Status: She is alert.     Coordination: Coordination normal.  Psychiatric:        Mood and Affect: Mood normal.         Behavior: Behavior normal.     ED Course/Procedures   Labs Reviewed  BASIC METABOLIC PANEL - Abnormal; Notable for the following components:      Result Value   BUN 32 (*)    Creatinine, Ser 1.46 (*)    GFR calc non Af Amer 34 (*)    GFR calc Af Amer 39 (*)    All other components within normal limits  HEPATIC FUNCTION PANEL - Abnormal; Notable for the following components:   Indirect Bilirubin 1.0 (*)    All other components within normal limits  TROPONIN I (HIGH SENSITIVITY) - Abnormal; Notable for the following components:   Troponin I (High Sensitivity) 18 (*)    All other components within normal limits  SARS CORONAVIRUS 2 (TAT 6-24 HRS)  CBC  PROTIME-INR  APTT  TYPE AND SCREEN  TROPONIN I (HIGH SENSITIVITY)   CT Angio Chest PE W and/or Wo Contrast  Result Date: 08/28/2019 CLINICAL DATA:  Hemoptysis since 2 p.m. EXAM: CT ANGIOGRAPHY CHEST WITH CONTRAST TECHNIQUE: Multidetector CT imaging of the chest was performed using the standard protocol during bolus administration of intravenous contrast. Multiplanar CT image reconstructions and MIPs were obtained to evaluate the vascular anatomy. CONTRAST:  1102mL OMNIPAQUE IOHEXOL 350 MG/ML SOLN COMPARISON:  CT 08/16/2018 FINDINGS: Cardiovascular: Satisfactory opacification the pulmonary arteries to the segmental level. No pulmonary artery filling defects are identified. Central pulmonary arteries are normal caliber. Normal heart size. No pericardial effusion. Mild mass effect upon the right heart by a moderate pectus deformity of the chest (Haller index 3.5). Atherosclerotic plaque within the normal caliber aorta. Normal 3 vessel branching of the aortic arch. Proximal great vessels are mildly calcified. Mediastinum/Nodes: No mediastinal fluid or gas. Normal thyroid gland and thoracic inlet. No acute abnormality of the trachea. Slightly patulous appearance of the thoracic esophagus but otherwise unremarkable. No worrisome mediastinal, hilar or  axillary adenopathy. Diminishing size of right hilar now measuring 7 mm in size (5/60). Lungs/Pleura: There is centrilobular and paraseptal emphysema better seen on comparison studies with less respiratory motion. Biapical pleuroparenchymal scarring is similar to the priors with some cicatricial/traction bronchiectasis there is diminished interstitial opacities in the right upper lobe when compared to prior. Chronic volume loss and scarring in the middle lobe is again seen. Diminished ground-glass and consolidative opacity lateral basal segments right lower lobe. Some ground-glass and centrilobular nodules are present within the lingula as well as some more focal areas of ground-glass attenuation in the posterior basal segment of the left lower lobe. Upper Abdomen: No acute abnormalities present in the visualized portions of the upper abdomen. Patient is post cholecystectomy. Mild atheromatous plaque in the upper abdomen. Musculoskeletal: Multilevel degenerative changes are present in the imaged portions of the spine. Marked sclerotic degenerative changes of the lower thoracic spine and thoracolumbar junction are similar to prior. Review of the MIP images confirms the above findings. IMPRESSION: 1. No acute pulmonary artery filling defects identified. 2. Diminishing size of right hilar lymph node now measuring 7 mm in size. 3. Diminished interstitial opacities in the right upper lobe and right lower lobe. 4. Some ground-glass and centrilobular nodules in the lingula and posterior basal segment of the left lower lobe appearance of features is suggestive of a chronic atypical infectious process (including etiologies such as atypical mycobacterial infection/MAI). 5. Moderate pectus deformity of the chest with associated mild mass effect upon the right heart. 6. Aortic Atherosclerosis (ICD10-I70.0). Electronically Signed   By: Lovena Le M.D.   On: 08/28/2019 15:22   DG Chest Port 1 View  Result Date:  08/28/2019 CLINICAL DATA:  Hemoptysis since 2 a.m. today. EXAM: PORTABLE CHEST 1 VIEW COMPARISON:  09/11/2018 FINDINGS: Normal sized heart. Tortuous and partially calcified thoracic aorta. Clear lungs with normal vascularity. The lungs remain hyperexpanded. Diffuse osteopenia. IMPRESSION: No acute abnormality. Stable changes of COPD. Electronically Signed   By: Claudie Revering M.D.   On: 08/28/2019 13:52     .Critical Care Performed by: Jacqlyn Larsen, PA-C Authorized by: Jacqlyn Larsen, PA-C   Critical care provider statement:    Critical care time (minutes):  45   Critical care was necessary to treat or prevent imminent or life-threatening deterioration of the following conditions:  Circulatory failure and cardiac failure   Critical care was time spent personally by me on the following activities:  Discussions with consultants, evaluation of patient's response to treatment, examination of patient, ordering and performing treatments and interventions, ordering and review of laboratory studies, ordering and review of radiographic studies, pulse oximetry, re-evaluation of patient's condition, obtaining history from patient or surrogate and review of old charts    MDM   79 year old female with hemoptysis, lab work largely unremarkable.  CTA shows no evidence  of pulmonary embolism.  There is diminishing size of the right hilar lymph nodes and diminished interstitial opacities in the right upper and lower lung compared to previous study.  There is some groundglass and central lobar nodules in the lingula and left lower lobe concerning for potential chronic atypical infectious process.  Discussed with infectious disease.  Case discussed with Dr. Karolee Ohs infectious disease who does not feel that patient needs to be isolation for potential TB, 2 years ago she had 3 - acid-fast cultures, but Dr. Graylon Good does recommend repeating acid-fast cultures and respiratory sputum culture and treating for  community-acquired pneumonia with vancomycin given multiple MRSA positive respiratory cultures in the past, as well as Rocephin.  Dr. Graylon Good recommends holding azithromycin at this time.  Patient has been started on Cardizem for A. fib.  Will discuss with hospitalist for admission.  Case discussed with Dr. Cathlean Sauer with [facility] hospitalist who will see and admit the patient.       Jacqlyn Larsen, PA-C 08/28/19 1629    Drenda Freeze, MD 08/29/19 [phone]

## 2019-08-28 NOTE — ED Notes (Signed)
RN received multiple readings of BP with 518+ systolic with no hx of HTN per patient. Patient also c/o headaches. Erlene Quan, PA-C notified at this time.

## 2019-08-28 NOTE — ED Provider Notes (Signed)
[facility] DEPT Provider Note   CSN: [id number] Arrival date & time: 08/28/19  1229     History Chief Complaint  Patient presents with  . Hemoptysis    Deborah Shields is a 79 y.o. female with history of pulmonary embolism, hypertension, hyperlipidemia, CKD, COPD.  Patient reports that she woke up around 1:30 AM last night coughing, she noticed small amount of hemoptysis at that time.  She has been intermittently coughing since last night and has had increasing amounts of hemoptysis.  She denies any associated chest pain or shortness of breath.  She reports similar episode several years ago where she underwent a bronchoscopy and had multiple blood clots removed from her lungs.  She reports that prior to today she had been in her normal state of health.  She denies any fever/chills, fall/injury, headache, neck pain, chest pain/shortness of breath, abdominal pain, nausea/vomiting, diarrhea, numbness or tingling, weakness, swelling of the extremities or any additional concerns. HPI     Past Medical History:  Diagnosis Date  . Anxiety   . Aortic insufficiency    mild due to degenerative changes  . Arthritis   . Basal cell carcinoma 2007   [place] [facility] Left leg  . Carotid artery occlusion   . Chronic kidney disease    CRD Stage 3  . Conjunctivitis due to adenovirus, both eyes   . Degenerative joint disease   . Diverticulosis of colon   . Dyspnea    with exertion  . Heart murmur   . Hyperlipidemia    NMR 07/2009: LDL 200 (2260/1233)TG 99, HDL 65. LDL goal =<120, ideally <100. father MI @ 71  . Hypertension   . Hypothyroidism    Dr Wilson Singer  . Microscopic hematuria   . Peripheral neuropathy    compressive in UE bilaterally; Dr Daylene Katayama  . Peripheral vascular disease ([place])    ICA bilat, Dr.Charles fields, [facility]  . Pneumonia    2017  . PONV (postoperative nausea and vomiting)    "Inner ear, does  okay with Scopolamine"  . Pulmonary embolus  ([place])   . Rectocele   . [place] spotted fever     Patient Active Problem List   Diagnosis Date Noted  . Pneumonia of right upper lobe due to methicillin resistant Staphylococcus aureus (MRSA) ([place])   . Pneumonia due to infectious organism   . Sepsis ([place]) 08/16/2018  . Community acquired pneumonia 08/16/2018  . Malnutrition of moderate degree 07/16/2017  . Hemoptysis 07/15/2017  . HNP (herniated nucleus pulposus), lumbar 03/20/2017  . Edema of both feet 12/10/2016  . Tick bite 11/21/2015  . Abnormal urine odor 11/21/2015  . Shortness of breath 11/21/2015  . Other fatigue 11/21/2015  . Anxiety 08/15/2015  . Encounter for pre-operative cardiovascular clearance 07/10/2015  . Pre-operative clearance 07/07/2015  . Rotator cuff tear 07/07/2015  . Rectocele, female 08/04/2014  . COPD with chronic bronchitis and emphysema ([place]) 02/14/2014  . Other malaise and fatigue 12/31/2013  . PAF (paroxysmal atrial fibrillation) ([place]) 01/10/2013  . Thrombosed external hemorrhoid 11/26/2012  . Chronic renal insufficiency, stage III (moderate) 09/03/2011  . Atherosclerosis of native arteries of the extremities with intermittent claudication 04/04/2011  . Occlusion and stenosis of carotid artery without mention of cerebral infarction 04/04/2011  . PERIPHERAL NEUROPATHY 08/15/2008  . Hypothyroidism 08/12/2007  . HYPERLIPIDEMIA 08/12/2007  . Essential hypertension 08/12/2007  . Osteopenia 08/12/2007  . Carotid artery stenosis, asymptomatic 08/11/2007  . Osteoarthritis 08/11/2007  . BASAL CELL CARCINOMA,  HX OF 08/11/2007  . FIBROCYSTIC BREAST DISEASE 11/18/2006  . DIVERTICULOSIS, COLON 08/10/2006    Past Surgical History:  Procedure Laterality Date  . ABDOMINAL HYSTERECTOMY  1973   age 23 due to dysfuctional menses; HRT x 25-30 years  . APPENDECTOMY  1952  . basal cell cancer  03/2006   leg  . BILATERAL OOPHORECTOMY  1990   prophylactically (sister had ovarian ca)  . CARPAL TUNNEL  RELEASE Bilateral 1989   right  . CATARACT EXTRACTION, BILATERAL  2010   Dr Kathrin Penner  . CHOLECYSTECTOMY  2006  . COLONOSCOPY     Dr Ardis Hughs  . EYE SURGERY Left    Bilateral eye (film removed lt eye 01/09/17)  . LUMBAR LAMINECTOMY/DECOMPRESSION MICRODISCECTOMY Left 03/20/2017   Procedure: LEFT LUMBAR FOURLUMBAR FIVE LAMINOTOMY AND MICROICRODISCECTOMY 1 LEVEL;  Surgeon: Jovita Gamma, MD;  Location: [facility];  Service: Neurosurgery;  Laterality: Left;  LEFT  . NM MYOVIEW LTD  06/13/2008   low risk scan  . ORTHOPEDIC SURGERY  1989/200/2002/2012   elbows,shoulder surgery x 3, right hand  . RECTOCELE REPAIR  2016  . TONSILLECTOMY  1957  . US ECHOCARDIOGRAPHY  05/01/2010   trace MR,AI,TR;EF =>55%  . VIDEO BRONCHOSCOPY Bilateral 07/17/2017   Procedure: VIDEO BRONCHOSCOPY WITHOUT FLUORO;  Surgeon: Rigoberto Noel, MD;  Location: [facility];  Service: Cardiopulmonary;  Laterality: Bilateral;  . VIDEO BRONCHOSCOPY Bilateral 09/02/2017   Procedure: VIDEO BRONCHOSCOPY WITHOUT FLUORO;  Surgeon: Rigoberto Noel, MD;  Location: [facility];  Service: Cardiopulmonary;  Laterality: Bilateral;     OB History    Gravida  3   Para      Term      Preterm      AB  2   Living  1     SAB  2   TAB      Ectopic      Multiple      Live Births              Family History  Problem Relation Age of Onset  . Heart attack Father 26  . Heart disease Father        Before age 10  . Colon cancer Mother 27  . Stroke Mother 30  . Kidney disease Mother        ? renal calculi; S/P resecton of kidney  . Cancer - Colon Mother 28  . Ovarian cancer Sister   . Other Sister        Valve replacement (aortic & mitral ) in 2 sisters  . Heart disease Sister        Before age 55  . Heart disease Brother        aortic & mitral valve replacement in 2 bro; both had CBAG  . Diabetes Neg Hx   . Breast cancer Neg Hx     Social History   Tobacco Use  . Smoking status: Never Smoker  . Smokeless  tobacco: Never Used  Substance Use Topics  . Alcohol use: No    Alcohol/week: 0.0 standard drinks  . Drug use: No    Home Medications Prior to Admission medications   Medication Sig Start Date End Date Taking? Authorizing Provider  acetaminophen (TYLENOL) 325 MG tablet Take 325 mg by mouth every 6 (six) hours as needed for fever or headache.    [provider]  b complex vitamins tablet Take 1 tablet by mouth daily after supper.     [provider]  cyclobenzaprine (  FLEXERIL) 10 MG tablet Take 10 mg by mouth 2 (two) times daily as needed for muscle spasms (scheduled every every).     [provider]  diclofenac sodium (VOLTAREN) 1 % GEL Apply 2-4 g topically 4 (four) times daily as needed (for pain).     [provider]  FIBER PO Take 1 tablet by mouth daily.    [provider]  levothyroxine (SYNTHROID) 88 MCG tablet Take 88 mcg by mouth daily before breakfast. Taking the brand "synthroid"    [provider]  LORazepam (ATIVAN) 0.5 MG tablet Take 1 tablet (0.5 mg total) by mouth 3 (three) times daily as needed. Patient taking differently: Take 0.5 mg by mouth at bedtime.  10/17/15   Binnie Rail, MD  meclizine (ANTIVERT) 25 MG tablet Take 1 tablet (25 mg total) by mouth daily. 1/2 every 8 hrs prn for imbalance Patient taking differently: Take 12.5-25 mg by mouth at bedtime.  11/01/10   Hendricks Limes, MD  metoprolol succinate (TOPROL-XL) 50 MG 24 hr tablet Take 25 mg by mouth 2 (two) times daily after a meal. After breakfast & after supper 05/27/17   [provider]  Multiple Vitamins-Minerals (CENTRUM SILVER PO) Take 1 tablet by mouth daily after supper.     [provider]  Probiotic Product (PROBIOTIC ADVANCED PO) Take 1 capsule by mouth daily after breakfast. VSL #3    [provider]  senna (SENOKOT) 8.6 MG tablet Take 1 tablet by mouth daily.    [provider]  traMADol (ULTRAM) 50 MG tablet  1/2-1 by mouth once daily as needed. Patient taking differently: Take 50 mg by mouth 2 (two) times daily as needed for moderate pain or severe pain (1 tablet by mouth scheduled each bedtime.).  10/17/15   Binnie Rail, MD    Allergies    Aspirin, Lovastatin, Benazepril hcl, Paroxetine, Tape, Bupropion hcl, Ezetimibe, Fenofibrate, and Pravastatin sodium  Review of Systems   Review of Systems Ten systems are reviewed and are negative for acute change except as noted in the HPI  Physical Exam Updated Vital Signs BP (!) 185/104 (BP Location: Left Arm)   Pulse 69   Temp (!) 97.5 F (36.4 C) (Oral)   Resp 16   Ht 5' 6.5" (1.689 m)   Wt 51.7 kg   SpO2 100%   BMI 18.12 kg/m   Physical Exam Constitutional:      General: She is not in acute distress.    Appearance: Normal appearance. She is well-developed. She is not ill-appearing or diaphoretic.  HENT:     Head: Normocephalic and atraumatic.     Right Ear: External ear normal.     Left Ear: External ear normal.     Nose: Nose normal.  Eyes:     General: Vision grossly intact. Gaze aligned appropriately.     Pupils: Pupils are equal, round, and reactive to light.  Neck:     Trachea: Trachea and phonation normal. No tracheal deviation.  Cardiovascular:     Rate and Rhythm: Normal rate and regular rhythm.     Pulses: Normal pulses.  Pulmonary:     Effort: Pulmonary effort is normal. No respiratory distress.     Breath sounds: Normal breath sounds.  Abdominal:     General: There is no distension.     Palpations: Abdomen is soft.     Tenderness: There is no abdominal tenderness. There is no guarding or rebound.  Musculoskeletal:  General: Normal range of motion.     Cervical back: Normal range of motion.     Right lower leg: No edema.     Left lower leg: No edema.  Skin:    General: Skin is warm and dry.  Neurological:     Mental Status: She is alert.     GCS: GCS eye subscore is 4. GCS verbal subscore is 5. GCS motor  subscore is 6.     Comments: Speech is clear and goal oriented, follows commands Major Cranial nerves without deficit, no facial droop Moves extremities without ataxia, coordination intact  Psychiatric:        Behavior: Behavior normal.     ED Results / Procedures / Treatments   Labs (all labs ordered are listed, but only abnormal results are displayed) Labs Reviewed  BASIC METABOLIC PANEL  CBC  TROPONIN I (HIGH SENSITIVITY)    EKG None  Radiology No results found.  Procedures Procedures (including critical care time)  Medications Ordered in ED Medications - No data to display  ED Course  I have reviewed the triage vital signs and the nursing notes.  Pertinent labs & imaging results that were available during my care of the patient were reviewed by me and considered in my medical decision making (see chart for details).    MDM Rules/Calculators/A&P                     79 year old female with history as above presents today for hemoptysis that began 1:30 AM last night.  On arrival she is stable, well-appearing no acute distress.  Work-up has begun including CBC, BMP, chest x-ray, EKG and troponin.  Chart reviewed patient seen by Dr. Elsworth Soho previously, no definitive diagnosis prior for etiology.  She is not currently anticoagulated, I have added coag panel and a CT angio PE study.  Patient seen and evaluated by Dr. Ronnald Nian. - CBC with normal limits no leukocytosis to suggest infection and no evidence of anemia. BMP shows elevation of creatinine and BUN which appears slightly elevated from baseline no emergent electrolyte derangement PT/INR and APTT within normal limits Troponin 18 CXR:  IMPRESSION:  No acute abnormality. Stable changes of COPD.  - Patient reassessed resting comfortably no acute distress.  Noted to be hypertensive, RN informs that patient has had nursing headache.  Discussed case with Dr. Ronnald Nian, morphine IV labetalol ordered. - Care handoff given to  to Benedetto Goad PA-C at shift change, plan of care is to follow-up on CT angio study and admit.  Went to reevaluate patient around 3:10 PM, at that time rate on monitor noted to be approximately 130 bpm.  She denies any chest pain or shortness of breath.  Additionally she denies any headache, no she has not received morphine or labetalol at this time.  I have asked RN to repeat an EKG.  Discussed case with Dr. Darl Householder who has taken over at shift change, labetalol discontinued. Appears as Atrial flutter RVR on EKG. Cardizem ordered by oncoming team.  Will avoid anticoagulation in patient with hemoptysis.  Care handoff given to oncoming team.  Note: Portions of this report may have been transcribed using voice recognition software. Every effort was made to ensure accuracy; however, inadvertent computerized transcription errors may still be present. Final Clinical Impression(s) / ED Diagnoses Final diagnoses:  None    Rx / DC Orders ED Discharge Orders    None       Deliah Boston, PA-C 08/28/19 1521  Lennice Sites, DO 09/07/19 1300

## 2019-08-28 NOTE — ED Triage Notes (Signed)
Patient c/o hemoptysis since 0200 today and became more frequent and heavier.

## 2019-08-28 NOTE — ED Notes (Signed)
RN spoke to daughter, [name] [phone]) and gave updates on patient. Daughter requested updates on any major changes and when patient gets room.

## 2019-08-28 NOTE — ED Provider Notes (Signed)
Medical screening examination/treatment/procedure(s) were conducted as a shared visit with non-physician practitioner(s) and myself.  I personally evaluated the patient during the encounter. Briefly, the patient is a 79 y.o. female with history of hypertension, PE who presents to the ED with hemoptysis.  Patient has had coughing up of blood every several hours for the last 12 hours.  History of the same several years ago.  Had bronchoscopy that was unremarkable.  Was treated for pneumonia.  Patient is not on blood thinners.  She takes aspirin.  She denies any fever, chills.  Overall has unremarkable exam.  Concern for possible infectious process or blood clot.  Bronchoscopy 2 years ago did not reveal a mass.  Patient hemodynamically stable.  Patient with creatinine of 1.46.  Hemoglobin 12.3.  Otherwise lab work unremarkable.  Troponin mildly elevated.  X-ray of the chest shows no acute findings.  Awaiting CT scan of the chest for further evaluation for blood clot or infectious process.  Anticipate observation admission.  May need another bronchoscopy.  EKG shows sinus rhythm.  No ischemic changes.  Normal intervals.  This chart was dictated using voice recognition software.  Despite best efforts to proofread,  errors can occur which can change the documentation meaning.       Lennice Sites, DO 08/28/19 1452

## 2019-08-28 NOTE — ED Notes (Signed)
[name] (patient's daughter) called and updated on patient's new room.

## 2019-08-28 NOTE — ED Notes (Signed)
Patient ambulated to the bathroom independently at this time.

## 2019-08-28 NOTE — H&P (Signed)
History and Physical    Deborah Shields QJF:[id number] DOB: 04-07-1941 DOA: 08/28/2019  PCP: Lajean Manes, MD   Patient coming from: Home   Chief Complaint: hemoptysis.   HPI: Deborah Shields is a 79 y.o. female with medical history significant of stage III chronic kidney disease, dyslipidemia, hypertension, arthritis, and paroxysmal atrial fibrillation. Patient has been at her usual state of health until this morning around 1:30 AM when she woke up coughing, with positive bright red blood hemoptysis.  About half a cup several times.  Her hemoptysis lasted for couple hours and then she was able to sleep.  Hours later she woke up again coughing up blood and she decided to come to the hospital for further evaluation.  About 2 years ago she had a similar episode, apparently she had extensive work-up including bronchoscopy with negative results.  She has been taking a 81 mg aspirin for her paroxysmal atrial fibrillation.  Her rate has been well controlled with metoprolol but she skipped today's a.m. dosing due to hemoptysis.  Hemoptysis is intermittent, moderate to severe intensity, bright red blood, half a cup which time, no apparent improving worsening factors, patient denies any fevers or wheezing.  No malaise or generalized weakness.  No Covid contacts.  She is scheduled to have her first shot of the Covid vaccine March 11.  ED Course: Patient developed atrial fibrillation with rapid ventricular response while emergency department, heart rate 130 bpm, her hemoglobin is 12.3 with hematocrit 38.7, platelets 257.  Her chest CT showed some groundglass and centrilobular nodules in the lingula and posterior basal segment of the left lower lobe.  Patient has been placed on a diltiazem drip, received broad-spectrum antibiotic therapy and referred for admission for evaluation.  Review of Systems:  1. General: No fevers, no chills, no weight gain or weight loss 2. ENT: No runny nose or sore  throat, no hearing disturbances 3. Pulmonary: No dyspnea, positive cough and hemoptysis as mentioned in the HPI.  4. Cardiovascular: No angina, claudication, lower extremity edema, pnd or orthopnea 5. Gastrointestinal: No nausea or vomiting, no diarrhea or constipation 6. Hematology: No easy bruisability or frequent infections 7. Urology: No dysuria, hematuria or increased urinary frequency 8. Dermatology: No rashes. 9. Neurology: No seizures or paresthesias 10. Musculoskeletal: No joint pain or deformities  Past Medical History:  Diagnosis Date  . Anxiety   . Aortic insufficiency    mild due to degenerative changes  . Arthritis   . Basal cell carcinoma 2007   [place] [facility] Left leg  . Carotid artery occlusion   . Chronic kidney disease    CRD Stage 3  . Conjunctivitis due to adenovirus, both eyes   . Degenerative joint disease   . Diverticulosis of colon   . Dyspnea    with exertion  . Heart murmur   . Hyperlipidemia    NMR 07/2009: LDL 200 (2260/1233)TG 99, HDL 65. LDL goal =<120, ideally <100. father MI @ 17  . Hypertension   . Hypothyroidism    Dr Wilson Singer  . Microscopic hematuria   . Peripheral neuropathy    compressive in UE bilaterally; Dr Daylene Katayama  . Peripheral vascular disease ([place])    ICA bilat, Dr.Charles fields, [facility]  . Pneumonia    2017  . PONV (postoperative nausea and vomiting)    "Inner ear, does  okay with Scopolamine"  . Pulmonary embolus ([place])   . Rectocele   . [facility] spotted fever     Past Surgical  History:  Procedure Laterality Date  . ABDOMINAL HYSTERECTOMY  1973   age 25 due to dysfuctional menses; HRT x 25-30 years  . APPENDECTOMY  1952  . basal cell cancer  03/2006   leg  . BILATERAL OOPHORECTOMY  1990   prophylactically (sister had ovarian ca)  . CARPAL TUNNEL RELEASE Bilateral 1989   right  . CATARACT EXTRACTION, BILATERAL  2010   Dr Kathrin Penner  . CHOLECYSTECTOMY  2006  . COLONOSCOPY     Dr Ardis Hughs  . EYE SURGERY Left     Bilateral eye (film removed lt eye 01/09/17)  . LUMBAR LAMINECTOMY/DECOMPRESSION MICRODISCECTOMY Left 03/20/2017   Procedure: LEFT LUMBAR FOURLUMBAR FIVE LAMINOTOMY AND MICROICRODISCECTOMY 1 LEVEL;  Surgeon: Jovita Gamma, MD;  Location: [place];  Service: Neurosurgery;  Laterality: Left;  LEFT  . NM MYOVIEW LTD  06/13/2008   low risk scan  . ORTHOPEDIC SURGERY  1989/200/2002/2012   elbows,shoulder surgery x 3, right hand  . RECTOCELE REPAIR  2016  . TONSILLECTOMY  1957  . US ECHOCARDIOGRAPHY  05/01/2010   trace MR,AI,TR;EF =>55%  . VIDEO BRONCHOSCOPY Bilateral 07/17/2017   Procedure: VIDEO BRONCHOSCOPY WITHOUT FLUORO;  Surgeon: Rigoberto Noel, MD;  Location: [facility];  Service: Cardiopulmonary;  Laterality: Bilateral;  . VIDEO BRONCHOSCOPY Bilateral 09/02/2017   Procedure: VIDEO BRONCHOSCOPY WITHOUT FLUORO;  Surgeon: Rigoberto Noel, MD;  Location: [facility];  Service: Cardiopulmonary;  Laterality: Bilateral;     reports that she has never smoked. She has never used smokeless tobacco. She reports that she does not drink alcohol or use drugs.  Allergies  Allergen Reactions  . Aspirin Nausea Only and Other (See Comments)    REACTION: GI upset  ( pt can take 81 mg but NOT   325 mg ASA )  . Lovastatin Nausea Only and Other (See Comments)    REACTION: nausea  . Benazepril Hcl Other (See Comments)    Unknown  . Paroxetine Other (See Comments)    Unknown  . Tape Other (See Comments)  . Bupropion Hcl Other (See Comments)    REACTION: tinnitis  . Ezetimibe Nausea And Vomiting and Other (See Comments)    REACTION: GI symptoms  . Fenofibrate Other (See Comments)    Myalgia   . Pravastatin Sodium Other (See Comments)    REACTION: elevated CPK - Muscle's in bilateral Leg    Family History  Problem Relation Age of Onset  . Heart attack Father 103  . Heart disease Father        Before age 13  . Colon cancer Mother 36  . Stroke Mother 33  . Kidney disease Mother        ? renal  calculi; S/P resecton of kidney  . Cancer - Colon Mother 66  . Ovarian cancer Sister   . Other Sister        Valve replacement (aortic & mitral ) in 2 sisters  . Heart disease Sister        Before age 63  . Heart disease Brother        aortic & mitral valve replacement in 2 bro; both had CBAG  . Diabetes Neg Hx   . Breast cancer Neg Hx      Prior to Admission medications   Medication Sig Start Date End Date Taking? Authorizing Provider  acetaminophen (TYLENOL) 325 MG tablet Take 325 mg by mouth every 6 (six) hours as needed for fever or headache.    [provider]  b complex  vitamins tablet Take 1 tablet by mouth daily after supper.     [provider]  cyclobenzaprine (FLEXERIL) 10 MG tablet Take 10 mg by mouth 2 (two) times daily as needed for muscle spasms (scheduled every every).     [provider]  diclofenac sodium (VOLTAREN) 1 % GEL Apply 2-4 g topically 4 (four) times daily as needed (for pain).     [provider]  FIBER PO Take 1 tablet by mouth daily.    [provider]  levothyroxine (SYNTHROID) 88 MCG tablet Take 88 mcg by mouth daily before breakfast. Taking the brand "synthroid"    [provider]  LORazepam (ATIVAN) 0.5 MG tablet Take 1 tablet (0.5 mg total) by mouth 3 (three) times daily as needed. Patient taking differently: Take 0.5 mg by mouth at bedtime.  10/17/15   Binnie Rail, MD  meclizine (ANTIVERT) 25 MG tablet Take 1 tablet (25 mg total) by mouth daily. 1/2 every 8 hrs prn for imbalance Patient taking differently: Take 12.5-25 mg by mouth at bedtime.  11/01/10   Hendricks Limes, MD  metoprolol succinate (TOPROL-XL) 50 MG 24 hr tablet Take 25 mg by mouth 2 (two) times daily after a meal. After breakfast & after supper 05/27/17   [provider]  Multiple Vitamins-Minerals (CENTRUM SILVER PO) Take 1 tablet by mouth daily after supper.     [provider]  Probiotic Product (PROBIOTIC  ADVANCED PO) Take 1 capsule by mouth daily after breakfast. VSL #3    [provider]  senna (SENOKOT) 8.6 MG tablet Take 1 tablet by mouth daily.    [provider]  traMADol (ULTRAM) 50 MG tablet 1/2-1 by mouth once daily as needed. Patient taking differently: Take 50 mg by mouth 2 (two) times daily as needed for moderate pain or severe pain (1 tablet by mouth scheduled each bedtime.).  10/17/15   Binnie Rail, MD    Physical Exam: Vitals:   08/28/19 1432 08/28/19 1434 08/28/19 1436 08/28/19 1445  BP: (!) 199/102 (!) 196/94 (!) 176/123 (!) 186/115  Pulse:  77    Resp:  20 14 16   Temp:      TempSrc:      SpO2:  94%    Weight:      Height:        Vitals:   08/28/19 1432 08/28/19 1434 08/28/19 1436 08/28/19 1445  BP: (!) 199/102 (!) 196/94 (!) 176/123 (!) 186/115  Pulse:  77    Resp:  20 14 16   Temp:      TempSrc:      SpO2:  94%    Weight:      Height:       General: deconditioned.  Neurology: Awake and alert, non focal Head and Neck. Head normocephalic. Neck supple with no adenopathy or thyromegaly.   E ENT: no pallor, no icterus, oral mucosa moist Cardiovascular: No JVD. S1-S2 present, irregularly irregular, no gallops, rubs, or murmurs. No lower extremity edema. Pulmonary: positive breath sounds bilaterally, adequate air movement, no wheezing, rhonchi or rales. Gastrointestinal. Abdomen with no organomegaly, non tender, no rebound or guarding Skin. No rashes Musculoskeletal: no joint deformities    Labs on Admission: I have personally reviewed following labs and imaging studies  CBC: Recent Labs  Lab 08/28/19 1247  WBC 6.5  HGB 12.3  HCT 38.7  MCV 93.3  PLT 539   Basic Metabolic Panel: Recent Labs  Lab 08/28/19 1247  NA 140  K 3.6  CL 104  CO2 28  GLUCOSE 97  BUN 32*  CREATININE 1.46*  CALCIUM 8.9   GFR: Estimated Creatinine Clearance: 25.1 mL/min (A) (by C-G formula based on SCr of 1.46 mg/dL (H)). Liver Function  Tests: Recent Labs  Lab 08/28/19 1315  AST 27  ALT 15  ALKPHOS 90  BILITOT 1.1  PROT 7.0  ALBUMIN 3.5   No results for input(s): LIPASE, AMYLASE in the last 168 hours. No results for input(s): AMMONIA in the last 168 hours. Coagulation Profile: Recent Labs  Lab 08/28/19 1315  INR 1.0   Cardiac Enzymes: No results for input(s): CKTOTAL, CKMB, CKMBINDEX, TROPONINI in the last 168 hours. BNP (last 3 results) No results for input(s): PROBNP in the last 8760 hours. HbA1C: No results for input(s): HGBA1C in the last 72 hours. CBG: No results for input(s): GLUCAP in the last 168 hours. Lipid Profile: No results for input(s): CHOL, HDL, LDLCALC, TRIG, CHOLHDL, LDLDIRECT in the last 72 hours. Thyroid Function Tests: No results for input(s): TSH, T4TOTAL, FREET4, T3FREE, THYROIDAB in the last 72 hours. Anemia Panel: No results for input(s): VITAMINB12, FOLATE, FERRITIN, TIBC, IRON, RETICCTPCT in the last 72 hours. Urine analysis:    Component Value Date/Time   COLORURINE YELLOW 08/17/2018 0505   APPEARANCEUR CLEAR 08/17/2018 0505   LABSPEC 1.024 08/17/2018 0505   PHURINE 6.0 08/17/2018 0505   GLUCOSEU NEGATIVE 08/17/2018 0505   GLUCOSEU NEGATIVE 11/21/2015 1156   HGBUR MODERATE (A) 08/17/2018 0505   BILIRUBINUR NEGATIVE 08/17/2018 0505   BILIRUBINUR Neg 09/07/2012 1426   KETONESUR NEGATIVE 08/17/2018 0505   PROTEINUR NEGATIVE 08/17/2018 0505   UROBILINOGEN 0.2 11/21/2015 1156   NITRITE NEGATIVE 08/17/2018 0505   LEUKOCYTESUR NEGATIVE 08/17/2018 0505    Radiological Exams on Admission: CT Angio Chest PE W and/or Wo Contrast  Result Date: 08/28/2019 CLINICAL DATA:  Hemoptysis since 2 p.m. EXAM: CT ANGIOGRAPHY CHEST WITH CONTRAST TECHNIQUE: Multidetector CT imaging of the chest was performed using the standard protocol during bolus administration of intravenous contrast. Multiplanar CT image reconstructions and MIPs were obtained to evaluate the vascular anatomy. CONTRAST:   117mL OMNIPAQUE IOHEXOL 350 MG/ML SOLN COMPARISON:  CT 08/16/2018 FINDINGS: Cardiovascular: Satisfactory opacification the pulmonary arteries to the segmental level. No pulmonary artery filling defects are identified. Central pulmonary arteries are normal caliber. Normal heart size. No pericardial effusion. Mild mass effect upon the right heart by a moderate pectus deformity of the chest (Haller index 3.5). Atherosclerotic plaque within the normal caliber aorta. Normal 3 vessel branching of the aortic arch. Proximal great vessels are mildly calcified. Mediastinum/Nodes: No mediastinal fluid or gas. Normal thyroid gland and thoracic inlet. No acute abnormality of the trachea. Slightly patulous appearance of the thoracic esophagus but otherwise unremarkable. No worrisome mediastinal, hilar or axillary adenopathy. Diminishing size of right hilar now measuring 7 mm in size (5/60). Lungs/Pleura: There is centrilobular and paraseptal emphysema better seen on comparison studies with less respiratory motion. Biapical pleuroparenchymal scarring is similar to the priors with some cicatricial/traction bronchiectasis there is diminished interstitial opacities in the right upper lobe when compared to prior. Chronic volume loss and scarring in the middle lobe is again seen. Diminished ground-glass and consolidative opacity lateral basal segments right lower lobe. Some ground-glass and centrilobular nodules are present within the lingula as well as some more focal areas of ground-glass attenuation in the posterior basal segment of the left lower lobe. Upper Abdomen: No acute abnormalities present in the visualized portions of the upper abdomen.  Patient is post cholecystectomy. Mild atheromatous plaque in the upper abdomen. Musculoskeletal: Multilevel degenerative changes are present in the imaged portions of the spine. Marked sclerotic degenerative changes of the lower thoracic spine and thoracolumbar junction are similar to  prior. Review of the MIP images confirms the above findings. IMPRESSION: 1. No acute pulmonary artery filling defects identified. 2. Diminishing size of right hilar lymph node now measuring 7 mm in size. 3. Diminished interstitial opacities in the right upper lobe and right lower lobe. 4. Some ground-glass and centrilobular nodules in the lingula and posterior basal segment of the left lower lobe appearance of features is suggestive of a chronic atypical infectious process (including etiologies such as atypical mycobacterial infection/MAI). 5. Moderate pectus deformity of the chest with associated mild mass effect upon the right heart. 6. Aortic Atherosclerosis (ICD10-I70.0). Electronically Signed   By: Lovena Le M.D.   On: 08/28/2019 15:22   DG Chest Port 1 View  Result Date: 08/28/2019 CLINICAL DATA:  Hemoptysis since 2 a.m. today. EXAM: PORTABLE CHEST 1 VIEW COMPARISON:  09/11/2018 FINDINGS: Normal sized heart. Tortuous and partially calcified thoracic aorta. Clear lungs with normal vascularity. The lungs remain hyperexpanded. Diffuse osteopenia. IMPRESSION: No acute abnormality. Stable changes of COPD. Electronically Signed   By: Claudie Revering M.D.   On: 08/28/2019 13:52    EKG: Independently reviewed.  EKG had a 134 bpm, right axis deviation, normal QRS duration, atrial fibrillation rhythm, ST depressions lead II, III, aVF, V3 through V6, with no significant T wave abnormalities  Assessment/Plan Principal Problem:   Hemoptysis Active Problems:   Hypothyroidism   HYPERLIPIDEMIA   Essential hypertension   PAF (paroxysmal atrial fibrillation) (HCC)   COPD with chronic bronchitis and emphysema (HCC)   Pneumonia   79 year old female who presented with sudden hemoptysis, she does have significant past medical history for COPD, hypertension and paroxysmal atrial relation, she takes baby aspirin daily.  Her physical examination blood pressure is 166/98, heart rate 131, respiratory rate 13, oxygen  saturation 97% on room air, her lungs are clear to auscultation bilaterally, heart S1-S2 present, tachycardic irregularly irregular, soft abdomen, no lower extremity edema. Sodium 140, potassium 3.6, chloride 104, bicarb 28, glucose 97, BUN 32, creatinine 1.46, troponin I 18, white count 6.5, hemoglobin 12.3, hematocrit 38.7, platelets 257.  COVID-19 pending.  Chest radiograph with hyperinflation, no infiltrates.  CT chest with emphysema, bronchiectasis and biapical scaring.  Negative for pulmonary defects.  Some groundglass opacities in the lingula and posterior basal segment left lower lobe.  Patient will be admitted to the hospital with working diagnosis of hemoptysis in the setting of bronchiectasis and emphysema, complicated by atrial fibrillation with rapid ventricular response.  1.  Hemoptysis.  Patient will be admitted to the progressive care unit, will quantify the amount of hemoptysis, in a cup at bedside. Her hemoglobin and hematocrit are stable.  Her chest radiograph has biapical scars, bronchiectasis and COPD.  Faint groundglass at the lingula.  She takes 81 mg aspirin daily  It is possible that hemoptysis is related to bronchiectasis, cannot completely rule out atypical pneumonia.  Will continue supportive medical therapy with antitussive agents, bronchodilators and antibiotics.  Stop aspirin.  She had positive MRSA in the past but clinical features not consistent with MRSA pneumonia.  Will send AFB sputum cultures, for possible atypical mycobacteria.  2.  Atrial fibrillation with rapid ventricular response.  Continue rate control with diltiazem, keep heart rate less than 130, will resume AV blockade with metoprolol, hold anticoagulation  due to hemoptysis.  3.  Chronic kidney disease stage III.  Baseline creatinine 1.2, currently is 1.46, will continue close monitor kidney function electrolytes, hold on IV fluids for now.  4.  Hypothyroidism.  Continue levothyroxine.  DVT prophylaxis:   scd  Code Status: full  Family Communication: no family at the bedside   Disposition Plan: step down unit  Consults called: none   Admission status: Inpatient.     Dylan Monforte Gerome Apley MD [facility]   08/28/2019, 4:20 PM

## 2019-08-29 DIAGNOSIS — R042 Hemoptysis: Secondary | ICD-10-CM

## 2019-08-29 LAB — BASIC METABOLIC PANEL
Anion gap: 9 (ref 5–15)
BUN: 32 mg/dL — ABNORMAL HIGH (ref 8–23)
CO2: 24 mmol/L (ref 22–32)
Calcium: 8.7 mg/dL — ABNORMAL LOW (ref 8.9–10.3)
Chloride: 104 mmol/L (ref 98–111)
Creatinine, Ser: 1.28 mg/dL — ABNORMAL HIGH (ref 0.44–1.00)
GFR calc Af Amer: 46 mL/min — ABNORMAL LOW (ref >60)
GFR calc non Af Amer: 39 mL/min — ABNORMAL LOW (ref >60)
Glucose, Bld: 100 mg/dL — ABNORMAL HIGH (ref 70–99)
Potassium: 3.6 mmol/L (ref 3.5–5.1)
Sodium: 137 mmol/L (ref 135–145)

## 2019-08-29 LAB — CBC
HCT: 34.2 % — ABNORMAL LOW (ref 36.0–46.0)
Hemoglobin: 10.8 g/dL — ABNORMAL LOW (ref 12.0–15.0)
MCH: 29.3 pg (ref 26.0–34.0)
MCHC: 31.6 g/dL (ref 30.0–36.0)
MCV: 92.7 fL (ref 80.0–100.0)
Platelets: 220 10*3/uL (ref 150–400)
RBC: 3.69 MIL/uL — ABNORMAL LOW (ref 3.87–5.11)
RDW: 13.4 % (ref 11.5–15.5)
WBC: 7.8 10*3/uL (ref 4.0–10.5)
nRBC: 0 % (ref 0.0–0.2)

## 2019-08-29 LAB — SARS CORONAVIRUS 2 (TAT 6-24 HRS): SARS Coronavirus 2: NEGATIVE

## 2019-08-29 NOTE — Evaluation (Signed)
Clinical/Bedside Swallow Evaluation Patient Details  Name: Deborah Shields MRN: [id number] Date of Birth: 06/03/1941  Today's Date: 08/29/2019 Time: SLP Start Time (ACUTE ONLY): 4431 SLP Stop Time (ACUTE ONLY): 1609 SLP Time Calculation (min) (ACUTE ONLY): 14 min  Past Medical History:  Past Medical History:  Diagnosis Date  . Anxiety   . Aortic insufficiency    mild due to degenerative changes  . Arthritis   . Basal cell carcinoma 2007   [place] [facility] Left leg  . Carotid artery occlusion   . Chronic kidney disease    CRD Stage 3  . Conjunctivitis due to adenovirus, both eyes   . Degenerative joint disease   . Diverticulosis of colon   . Dyspnea    with exertion  . Heart murmur   . Hyperlipidemia    NMR 07/2009: LDL 200 (2260/1233)TG 99, HDL 65. LDL goal =<120, ideally <100. father MI @ 61  . Hypertension   . Hypothyroidism    Dr Wilson Singer  . Microscopic hematuria   . Peripheral neuropathy    compressive in UE bilaterally; Dr Daylene Katayama  . Peripheral vascular disease ([place])    ICA bilat, Dr.Charles fields, [facility]  . Pneumonia    2017  . PONV (postoperative nausea and vomiting)    "Inner ear, does  okay with Scopolamine"  . Pulmonary embolus ([place])   . Rectocele   . [place] spotted fever    Past Surgical History:  Past Surgical History:  Procedure Laterality Date  . ABDOMINAL HYSTERECTOMY  1973   age 42 due to dysfuctional menses; HRT x 25-30 years  . APPENDECTOMY  1952  . basal cell cancer  03/2006   leg  . BILATERAL OOPHORECTOMY  1990   prophylactically (sister had ovarian ca)  . CARPAL TUNNEL RELEASE Bilateral 1989   right  . CATARACT EXTRACTION, BILATERAL  2010   Dr Kathrin Penner  . CHOLECYSTECTOMY  2006  . COLONOSCOPY     Dr Ardis Hughs  . EYE SURGERY Left    Bilateral eye (film removed lt eye 01/09/17)  . LUMBAR LAMINECTOMY/DECOMPRESSION MICRODISCECTOMY Left 03/20/2017   Procedure: LEFT LUMBAR FOURLUMBAR FIVE LAMINOTOMY AND MICROICRODISCECTOMY 1 LEVEL;   Surgeon: Jovita Gamma, MD;  Location: [place];  Service: Neurosurgery;  Laterality: Left;  LEFT  . NM MYOVIEW LTD  06/13/2008   low risk scan  . ORTHOPEDIC SURGERY  1989/200/2002/2012   elbows,shoulder surgery x 3, right hand  . RECTOCELE REPAIR  2016  . TONSILLECTOMY  1957  . US ECHOCARDIOGRAPHY  05/01/2010   trace MR,AI,TR;EF =>55%  . VIDEO BRONCHOSCOPY Bilateral 07/17/2017   Procedure: VIDEO BRONCHOSCOPY WITHOUT FLUORO;  Surgeon: Rigoberto Noel, MD;  Location: [facility];  Service: Cardiopulmonary;  Laterality: Bilateral;  . VIDEO BRONCHOSCOPY Bilateral 09/02/2017   Procedure: VIDEO BRONCHOSCOPY WITHOUT FLUORO;  Surgeon: Rigoberto Noel, MD;  Location: [facility];  Service: Cardiopulmonary;  Laterality: Bilateral;   HPI:  Pt is an 79 y.o. female with medical history significant of stage III chronic kidney disease, dyslipidemia, hypertension, arthritis, and paroxysmal atrial fibrillation. Patient has been at her usual state of health until 08/28/19 when she woke up coughing with positive bright red blood hemoptysis. CT of the chest: ground-glass and centrilobular nodules in the lingula and posterior basal segment of the left lower lobe appearance of features suggestive of a chronic atypical infectious process including etiologies such as atypical mycobacterial infection/MAI. Diminished interstitial opacities in the right upper lobe and right lower lobe.   Assessment / Plan /  Recommendation Clinical Impression  Pt was seen for bedside swallow evaluation. Pt and her daughter denied the pt having a history of oropharyngeal dysphagia. Oral mechanism exam was [facility] with adequate dentition. She tolerated all solids and liquids without signs or symptoms of aspiration and her swallow mechanism appears functional at this time. A regular texture diet with thin liquids is recommended at this time. SLP will follow at least once more to ensure continued tolerance of the current diet and determine if  instrumental assessment is warranted.  SLP Visit Diagnosis: Dysphagia, unspecified (R13.10)    Aspiration Risk  No limitations    Diet Recommendation Regular;Thin liquid   Liquid Administration via: Straw;Cup Medication Administration: Whole meds with liquid Supervision: Patient able to self feed Postural Changes: Seated upright at 90 degrees    Other  Recommendations Oral Care Recommendations: Oral care BID   Follow up Recommendations None      Frequency and Duration min 2x/week  1 week       Prognosis Prognosis for Safe Diet Advancement: Good      Swallow Study   General Date of Onset: 08/28/19 HPI: Pt is an 79 y.o. female with medical history significant of stage III chronic kidney disease, dyslipidemia, hypertension, arthritis, and paroxysmal atrial fibrillation. Patient has been at her usual state of health until 08/28/19 when she woke up coughing with positive bright red blood hemoptysis. CT of the chest: ground-glass and centrilobular nodules in the lingula and posterior basal segment of the left lower lobe appearance of features suggestive of a chronic atypical infectious process including etiologies such as atypical mycobacterial infection/MAI. Diminished interstitial opacities in the right upper lobe and right lower lobe. Type of Study: Bedside Swallow Evaluation Previous Swallow Assessment: None Diet Prior to this Study: Regular;Thin liquids Temperature Spikes Noted: No Respiratory Status: Room air History of Recent Intubation: No Behavior/Cognition: Alert;Pleasant mood;Cooperative Oral Cavity Assessment: Within Functional Limits Oral Care Completed by SLP: No Oral Cavity - Dentition: Dentures, top;Dentures, bottom Vision: Functional for self-feeding Self-Feeding Abilities: Able to feed self Patient Positioning: Upright in bed;Postural control adequate for testing Baseline Vocal Quality: Normal Volitional Cough: Strong Volitional Swallow: Able to elicit     Oral/Motor/Sensory Function Overall Oral Motor/Sensory Function: Within functional limits   Ice Chips Ice chips: Within functional limits Presentation: Spoon   Thin Liquid Thin Liquid: Within functional limits Presentation: Cup;Straw    Nectar Thick Nectar Thick Liquid: Not tested   Honey Thick Honey Thick Liquid: Not tested   Puree Puree: Within functional limits Presentation: Spoon   Solid     Solid: Within functional limits Presentation: [place]. Hardin Negus, [place], [place] Office number [phone] Pager [phone]  Horton Marshall 08/29/2019,4:20 PM

## 2019-08-29 NOTE — Progress Notes (Signed)
PROGRESS NOTE    Deborah Shields  YQI:[id number] DOB: 05/14/1941 DOA: 08/28/2019 PCP: Lajean Manes, MD   Brief Narrative: Deborah Shields is a 79 y.o. female with medical history significant of stage III chronic kidney disease, dyslipidemia, hypertension, arthritis, and paroxysmal atrial fibrillation. Patient presented secondary to hemoptysis and found to have likely atypical pneumonia. Patient started on empiric Vancomycin, Ceftriaxone and Azithromycin. She was also found to have afib with RVR and started on a diltiazem drip which is now discontinued.   Assessment & Plan:   Principal Problem:   Hemoptysis Active Problems:   Hypothyroidism   HYPERLIPIDEMIA   Essential hypertension   PAF (paroxysmal atrial fibrillation) (HCC)   COPD with chronic bronchitis and emphysema (HCC)   Pneumonia   Hemoptysis Patient with previous history two years ago with blood clots found on video bronchoscopy. Concern for atypical pneumonia on current admission. Episode of hemoptysis overnight with slight hemoglobin drop -Consult pulmonology -Hold aspirin -Daily CBC  Ground glass lung opacities Presumed atypical lung infection. Started on Ceftriaxone, Vancomycin and Azithromycin in the ED and continued on Ceftriaxone on admission. AFB sputum smear and culture ordered and are pending -Continue Ceftriaxone and add Azithromycin  Paroxysmal atrial fibrillation with RVR Rapid rates in the ED secondary to patient not taking her metoprolol that day. Started on diltiazem drip on admission. Rates are now in the 50s-60s. Metoprolol restarted. Currently in sinus rhythm. -Discontinue diltiazem drip -Continue home metoprolol  Hypothyroidism -Continue Synthroid 88 mcg daily  Anxiety -Continue Ativan prn  CKD stage IIIb Stable.   DVT prophylaxis: SCDs Code Status:   Code Status: Full Code Family Communication: None at bedside Disposition Plan: Discharge likely in 24-48  hours   Consultants:   Pulmonology  Procedures:   None  Antimicrobials:  Ceftriaxone  Vancomycin  Azithromycin    Subjective: Some coughing this morning with some hemoptysis. No chest pain or dyspnea. No fevers  Objective: Vitals:   08/29/19 0300 08/29/19 0400 08/29/19 0500 08/29/19 0600  BP: 129/70 120/74 131/72   Pulse: 63 61 (!) 59 61  Resp: 13 14 12  (!) 21  Temp:  98.6 F (37 C)    TempSrc:  Oral    SpO2: 93% 92% 95% 98%  Weight:      Height:        Intake/Output Summary (Last 24 hours) at 08/29/2019 0843 Last data filed at 08/29/2019 0500 Gross per 24 hour  Intake 288.85 ml  Output 550 ml  Net -261.15 ml   Filed Weights   08/28/19 1241 08/28/19 1800  Weight: 51.7 kg 49.9 kg    Examination:  General exam: Appears calm and comfortable Respiratory system: Clear to auscultation. Respiratory effort normal. Cardiovascular system: S1 & S2 heard, RRR. Systolic murmur. Gastrointestinal system: Abdomen is nondistended, soft and nontender. No organomegaly or masses felt. Normal bowel sounds heard. Central nervous system: Alert and oriented. No focal neurological deficits. Extremities: No edema. No calf tenderness Skin: No cyanosis. No rashes Psychiatry: Judgement and insight appear normal. Mood & affect appropriate.     Data Reviewed: I have personally reviewed following labs and imaging studies  CBC: Recent Labs  Lab 08/28/19 1247 08/29/19 0316  WBC 6.5 7.8  HGB 12.3 10.8*  HCT 38.7 34.2*  MCV 93.3 92.7  PLT 257 387   Basic Metabolic Panel: Recent Labs  Lab 08/28/19 1247 08/29/19 0316  NA 140 137  K 3.6 3.6  CL 104 104  CO2 28 24  GLUCOSE 97 100*  BUN  32* 32*  CREATININE 1.46* 1.28*  CALCIUM 8.9 8.7*   GFR: Estimated Creatinine Clearance: 27.6 mL/min (A) (by C-G formula based on SCr of 1.28 mg/dL (H)). Liver Function Tests: Recent Labs  Lab 08/28/19 1315  AST 27  ALT 15  ALKPHOS 90  BILITOT 1.1  PROT 7.0  ALBUMIN 3.5   No  results for input(s): LIPASE, AMYLASE in the last 168 hours. No results for input(s): AMMONIA in the last 168 hours. Coagulation Profile: Recent Labs  Lab 08/28/19 1315  INR 1.0   Cardiac Enzymes: No results for input(s): CKTOTAL, CKMB, CKMBINDEX, TROPONINI in the last 168 hours. BNP (last 3 results) No results for input(s): PROBNP in the last 8760 hours. HbA1C: No results for input(s): HGBA1C in the last 72 hours. CBG: No results for input(s): GLUCAP in the last 168 hours. Lipid Profile: No results for input(s): CHOL, HDL, LDLCALC, TRIG, CHOLHDL, LDLDIRECT in the last 72 hours. Thyroid Function Tests: No results for input(s): TSH, T4TOTAL, FREET4, T3FREE, THYROIDAB in the last 72 hours. Anemia Panel: No results for input(s): VITAMINB12, FOLATE, FERRITIN, TIBC, IRON, RETICCTPCT in the last 72 hours. Sepsis Labs: No results for input(s): PROCALCITON, LATICACIDVEN in the last 168 hours.  Recent Results (from the past 240 hour(s))  SARS CORONAVIRUS 2 (TAT 6-24 HRS) Nasopharyngeal Nasopharyngeal Swab     Status: None   Collection Time: 08/28/19  3:09 PM   Specimen: Nasopharyngeal Swab  Result Value Ref Range Status   SARS Coronavirus 2 NEGATIVE NEGATIVE Final    Comment: (NOTE) SARS-CoV-2 target nucleic acids are NOT DETECTED. The SARS-CoV-2 RNA is generally detectable in upper and lower respiratory specimens during the acute phase of infection. Negative results do not preclude SARS-CoV-2 infection, do not rule out co-infections with other pathogens, and should not be used as the sole basis for treatment or other patient management decisions. Negative results must be combined with clinical observations, patient history, and epidemiological information. The expected result is Negative. Fact Sheet for Patients: SugarRoll.be Fact Sheet for Healthcare Providers: https://www.woods-mathews.com/ This test is not yet approved or cleared by the  [place] FDA and  has been authorized for detection and/or diagnosis of SARS-CoV-2 by FDA under an Emergency Use Authorization (EUA). This EUA will remain  in effect (meaning this test can be used) for the duration of the COVID-19 declaration under Section 56 4(b)(1) of the Act, 21 U.S.C. section 360bbb-3(b)(1), unless the authorization is terminated or revoked sooner. Performed at [facility], [place] [address] [place], [place] [address]   MRSA PCR Screening     Status: Abnormal   Collection Time: 08/28/19  5:20 PM   Specimen: Nasal Mucosa; Nasopharyngeal  Result Value Ref Range Status   MRSA by PCR POSITIVE (A) NEGATIVE Final    Comment:        The GeneXpert MRSA Assay (FDA approved for NASAL specimens only), is one component of a comprehensive MRSA colonization surveillance program. It is not intended to diagnose MRSA infection nor to guide or monitor treatment for MRSA infections. RESULT CALLED TO, READ BACK BY AND VERIFIED WITH: Susann Givens RN 1911 08/28/19 JM Performed at [facility], [place] [address], [place], [place] [address]          Radiology Studies: CT Angio Chest PE W and/or Wo Contrast  Result Date: 08/28/2019 CLINICAL DATA:  Hemoptysis since 2 p.m. EXAM: CT ANGIOGRAPHY CHEST WITH CONTRAST TECHNIQUE: Multidetector CT imaging of the chest was performed using the standard protocol during  bolus administration of intravenous contrast. Multiplanar CT image reconstructions and MIPs were obtained to evaluate the vascular anatomy. CONTRAST:  135mL OMNIPAQUE IOHEXOL 350 MG/ML SOLN COMPARISON:  CT 08/16/2018 FINDINGS: Cardiovascular: Satisfactory opacification the pulmonary arteries to the segmental level. No pulmonary artery filling defects are identified. Central pulmonary arteries are normal caliber. Normal heart size. No pericardial effusion. Mild mass effect upon the right heart by a moderate pectus deformity of the chest (Haller index 3.5).  Atherosclerotic plaque within the normal caliber aorta. Normal 3 vessel branching of the aortic arch. Proximal great vessels are mildly calcified. Mediastinum/Nodes: No mediastinal fluid or gas. Normal thyroid gland and thoracic inlet. No acute abnormality of the trachea. Slightly patulous appearance of the thoracic esophagus but otherwise unremarkable. No worrisome mediastinal, hilar or axillary adenopathy. Diminishing size of right hilar now measuring 7 mm in size (5/60). Lungs/Pleura: There is centrilobular and paraseptal emphysema better seen on comparison studies with less respiratory motion. Biapical pleuroparenchymal scarring is similar to the priors with some cicatricial/traction bronchiectasis there is diminished interstitial opacities in the right upper lobe when compared to prior. Chronic volume loss and scarring in the middle lobe is again seen. Diminished ground-glass and consolidative opacity lateral basal segments right lower lobe. Some ground-glass and centrilobular nodules are present within the lingula as well as some more focal areas of ground-glass attenuation in the posterior basal segment of the left lower lobe. Upper Abdomen: No acute abnormalities present in the visualized portions of the upper abdomen. Patient is post cholecystectomy. Mild atheromatous plaque in the upper abdomen. Musculoskeletal: Multilevel degenerative changes are present in the imaged portions of the spine. Marked sclerotic degenerative changes of the lower thoracic spine and thoracolumbar junction are similar to prior. Review of the MIP images confirms the above findings. IMPRESSION: 1. No acute pulmonary artery filling defects identified. 2. Diminishing size of right hilar lymph node now measuring 7 mm in size. 3. Diminished interstitial opacities in the right upper lobe and right lower lobe. 4. Some ground-glass and centrilobular nodules in the lingula and posterior basal segment of the left lower lobe appearance of  features is suggestive of a chronic atypical infectious process (including etiologies such as atypical mycobacterial infection/MAI). 5. Moderate pectus deformity of the chest with associated mild mass effect upon the right heart. 6. Aortic Atherosclerosis (ICD10-I70.0). Electronically Signed   By: Lovena Le M.D.   On: 08/28/2019 15:22   DG Chest Port 1 View  Result Date: 08/28/2019 CLINICAL DATA:  Hemoptysis since 2 a.m. today. EXAM: PORTABLE CHEST 1 VIEW COMPARISON:  09/11/2018 FINDINGS: Normal sized heart. Tortuous and partially calcified thoracic aorta. Clear lungs with normal vascularity. The lungs remain hyperexpanded. Diffuse osteopenia. IMPRESSION: No acute abnormality. Stable changes of COPD. Electronically Signed   By: Claudie Revering M.D.   On: 08/28/2019 13:52        Scheduled Meds: . acidophilus   Oral QPC breakfast  . B-complex with vitamin C  1 tablet Oral QPC supper  . Chlorhexidine Gluconate Cloth  6 each Topical Daily  . Chlorhexidine Gluconate Cloth  6 each Topical Q0600  . chlorpheniramine-HYDROcodone  5 mL Oral Q12H  . ipratropium-albuterol  3 mL Nebulization QID  . levothyroxine  88 mcg Oral QAC breakfast  . LORazepam  0.5 mg Oral QHS  . meclizine  12.5-25 mg Oral QHS  . metoprolol succinate  25 mg Oral BID PC  . multivitamin with minerals  1 tablet Oral QPC supper  . mupirocin ointment  1 application Nasal  BID  . senna  1 tablet Oral Daily   Continuous Infusions: . cefTRIAXone (ROCEPHIN)  IV Stopped (08/28/19 1922)     LOS: 1 day     Cordelia Poche, MD [facility] 08/29/2019, 8:43 AM  If 7PM-7AM, please contact night-coverage [email or web address]

## 2019-08-29 NOTE — Progress Notes (Signed)
CRITICAL VALUE ALERT  Critical Value:  MRSA + ([place])  Date & Time Notied:  03/06 @ 1915  Provider Notified: elink on call  Orders Received/Actions taken: MRSA + order set initiated

## 2019-08-29 NOTE — Consult Note (Signed)
NAME:  Deborah Shields, MRN:  [id number], DOB:  Jun 15, 1941, LOS: 1 ADMISSION DATE:  08/28/2019, CONSULTATION DATE:  08/29/19 REFERRING MD:  Lonny Prude, CHIEF COMPLAINT:  Hemoptysis   Brief History   79 year old woman w/ hx PAD p/w hemoptysis.  History of present illness   79 year old woman w/ hx PAD p/w hemoptysis.  Fair amount estimate ~100cc's yesterday.  None this AM.  Had this issue previously, originated from lingula, cultures grew MRSA at that time.  Sputum sent for culture this time.  PCCM consulted for potential bronchoscopy.  Patient does have some unintentional weight loss in 2017 that was attributed to tickborne illness, this has since stabilized.  On baby aspirin at home.  No recent sick contacts.  No infectious symptoms.  Admits to trouble swallowing that GI has evaluated her for and neg workup including EGD.  Past Medical History  PAD Prior PE Arthritis   Significant Hospital Events   08/28/19 admitted  Consults:  PCCM  Procedures:  N/A  Significant Diagnostic Tests:  CTA chest- lingular infiltrate  Micro Data:  Sputum>>  Antimicrobials:  Received vanc/ceftriaxone/azithromycin in ER   Interim history/subjective:  Admitted  Objective   Blood pressure 131/72, pulse 61, temperature 98.6 F (37 C), temperature source Oral, resp. rate (!) 21, height _0  (1.676 m), weight 49.9 kg, SpO2 98 %.        Intake/Output Summary (Last 24 hours) at 08/29/2019 0933 Last data filed at 08/29/2019 0500 Gross per 24 hour  Intake 288.85 ml  Output 550 ml  Net -261.15 ml   Filed Weights   08/28/19 1241 08/28/19 1800  Weight: 51.7 kg 49.9 kg    Examination: General: thin woman in NAD HENT: MM dry, trachea midline Lungs: Clear, no wheezing Cardiovascular: RRR, +murmur, ext warm Abdomen: Soft, +BS Extremities: Advanced arthritic changes Neuro: Moves all 4 ext to command Psych: AOx3, good insight    Resolved Hospital Problem list   N/A  Assessment & Plan:    Hemoptysis, abnormal CT- something is probably colonized in her lingula and causing issues.  AFB neg in 2019.  Had MRSA growing there before. - Hold all abx - Bronch/BAL/brushing tomorrow - Monitor and record amount of hemoptysis - If hemoptysis resolves we can do OP f/u after bronch   Dysphagia- should have SLP see to complete workup  Labs   CBC: Recent Labs  Lab 08/28/19 1247 08/29/19 0316  WBC 6.5 7.8  HGB 12.3 10.8*  HCT 38.7 34.2*  MCV 93.3 92.7  PLT 257 177    Basic Metabolic Panel: Recent Labs  Lab 08/28/19 1247 08/29/19 0316  NA 140 137  K 3.6 3.6  CL 104 104  CO2 28 24  GLUCOSE 97 100*  BUN 32* 32*  CREATININE 1.46* 1.28*  CALCIUM 8.9 8.7*   GFR: Estimated Creatinine Clearance: 27.6 mL/min (A) (by C-G formula based on SCr of 1.28 mg/dL (H)). Recent Labs  Lab 08/28/19 1247 08/29/19 0316  WBC 6.5 7.8    Liver Function Tests: Recent Labs  Lab 08/28/19 1315  AST 27  ALT 15  ALKPHOS 90  BILITOT 1.1  PROT 7.0  ALBUMIN 3.5   No results for input(s): LIPASE, AMYLASE in the last 168 hours. No results for input(s): AMMONIA in the last 168 hours.  ABG No results found for: PHART, PCO2ART, PO2ART, HCO3, TCO2, ACIDBASEDEF, O2SAT   Coagulation Profile: Recent Labs  Lab 08/28/19 1315  INR 1.0    Cardiac Enzymes: No results  for input(s): CKTOTAL, CKMB, CKMBINDEX, TROPONINI in the last 168 hours.  HbA1C: Hgb A1c MFr Bld  Date/Time Value Ref Range Status  05/21/2006 01:21 PM 5.4 4.6 - 6.0 % Final    Comment:    See lab report for associated comment(s)    CBG: No results for input(s): GLUCAP in the last 168 hours.  Review of Systems:    Positive Symptoms in bold:  Constitutional fevers, chills, weight loss, fatigue, anorexia, malaise  Eyes decreased vision, double vision, eye irritation  Ears, Nose, Mouth, Throat sore throat, trouble swallowing, sinus congestion  Cardiovascular chest pain, paroxysmal nocturnal dyspnea, lower ext edema,  palpitations   Respiratory SOB, cough, DOE, hemoptysis, wheezing  Gastrointestinal nausea, vomiting, diarrhea  Genitourinary burning with urination, trouble urinating  Musculoskeletal joint aches, joint swelling, back pain  Integumentary  rashes, skin lesions  Neurological focal weakness, focal numbness, trouble speaking, headaches  Psychiatric depression, anxiety, confusion  Endocrine polyuria, polydipsia, cold intolerance, heat intolerance  Hematologic abnormal bruising, abnormal bleeding, unexplained nose bleeds  Allergic/Immunologic recurrent infections, hives, swollen lymph nodes     Past Medical History  She,  has a past medical history of Anxiety, Aortic insufficiency, Arthritis, Basal cell carcinoma (2007), Carotid artery occlusion, Chronic kidney disease, Conjunctivitis due to adenovirus, both eyes, Degenerative joint disease, Diverticulosis of colon, Dyspnea, Heart murmur, Hyperlipidemia, Hypertension, Hypothyroidism, Microscopic hematuria, Peripheral neuropathy, Peripheral vascular disease ([place]), Pneumonia, PONV (postoperative nausea and vomiting), Pulmonary embolus ([place]), Rectocele, and [place] spotted fever.   Surgical History    Past Surgical History:  Procedure Laterality Date  . ABDOMINAL HYSTERECTOMY  1973   age 84 due to dysfuctional menses; HRT x 25-30 years  . APPENDECTOMY  1952  . basal cell cancer  03/2006   leg  . BILATERAL OOPHORECTOMY  1990   prophylactically (sister had ovarian ca)  . CARPAL TUNNEL RELEASE Bilateral 1989   right  . CATARACT EXTRACTION, BILATERAL  2010   Dr Kathrin Penner  . CHOLECYSTECTOMY  2006  . COLONOSCOPY     Dr Ardis Hughs  . EYE SURGERY Left    Bilateral eye (film removed lt eye 01/09/17)  . LUMBAR LAMINECTOMY/DECOMPRESSION MICRODISCECTOMY Left 03/20/2017   Procedure: LEFT LUMBAR FOURLUMBAR FIVE LAMINOTOMY AND MICROICRODISCECTOMY 1 LEVEL;  Surgeon: Jovita Gamma, MD;  Location: [facility];  Service: Neurosurgery;  Laterality: Left;   LEFT  . NM MYOVIEW LTD  06/13/2008   low risk scan  . ORTHOPEDIC SURGERY  1989/200/2002/2012   elbows,shoulder surgery x 3, right hand  . RECTOCELE REPAIR  2016  . TONSILLECTOMY  1957  . US ECHOCARDIOGRAPHY  05/01/2010   trace MR,AI,TR;EF =>55%  . VIDEO BRONCHOSCOPY Bilateral 07/17/2017   Procedure: VIDEO BRONCHOSCOPY WITHOUT FLUORO;  Surgeon: Rigoberto Noel, MD;  Location: [facility];  Service: Cardiopulmonary;  Laterality: Bilateral;  . VIDEO BRONCHOSCOPY Bilateral 09/02/2017   Procedure: VIDEO BRONCHOSCOPY WITHOUT FLUORO;  Surgeon: Rigoberto Noel, MD;  Location: [facility];  Service: Cardiopulmonary;  Laterality: Bilateral;     Social History   reports that she has never smoked. She has never used smokeless tobacco. She reports that she does not drink alcohol or use drugs.   Family History   Her family history includes Cancer - Colon (age of onset: 59) in her mother; Colon cancer (age of onset: 42) in her mother; Heart attack (age of onset: 40) in her father; Heart disease in her brother, father, and sister; Kidney disease in her mother; Other in her sister; Ovarian cancer in her sister; Stroke (  age of onset: 34) in her mother. There is no history of Diabetes or Breast cancer.   Allergies Allergies  Allergen Reactions  . Aspirin Nausea Only and Other (See Comments)    REACTION: GI upset  ( pt can take 81 mg but NOT   325 mg ASA )  . Lovastatin Nausea Only and Other (See Comments)    REACTION: nausea  . Benazepril Hcl Other (See Comments)    Unknown  . Paroxetine Other (See Comments)    Unknown  . Tape Other (See Comments)  . Bupropion Hcl Other (See Comments)    REACTION: tinnitis  . Ezetimibe Nausea And Vomiting and Other (See Comments)    REACTION: GI symptoms  . Fenofibrate Other (See Comments)    Myalgia   . Pravastatin Sodium Other (See Comments)    REACTION: elevated CPK - Muscle's in bilateral Leg     Home Medications  Prior to Admission medications     Medication Sig Start Date End Date Taking? Authorizing Provider  b complex vitamins tablet Take 1 tablet by mouth daily after supper.    Yes [provider]  cholecalciferol (VITAMIN D3) 25 MCG (1000 UNIT) tablet Take 1,000 Units by mouth daily.   Yes [provider]  cyclobenzaprine (FLEXERIL) 10 MG tablet Take 10 mg by mouth 2 (two) times daily as needed for muscle spasms (scheduled every every).    Yes [provider]  diclofenac sodium (VOLTAREN) 1 % GEL Apply 2-4 g topically 4 (four) times daily as needed (for pain).    Yes [provider]  levothyroxine (SYNTHROID) 88 MCG tablet Take 88 mcg by mouth daily before breakfast. Taking the brand "synthroid"   Yes [provider]  LORazepam (ATIVAN) 0.5 MG tablet Take 1 tablet (0.5 mg total) by mouth 3 (three) times daily as needed. Patient taking differently: Take 0.5 mg by mouth at bedtime.  10/17/15  Yes Burns, Claudina Lick, MD  meclizine (ANTIVERT) 25 MG tablet Take 1 tablet (25 mg total) by mouth daily. 1/2 every 8 hrs prn for imbalance Patient taking differently: Take 12.5-25 mg by mouth 2 (two) times daily as needed for dizziness.  11/01/10  Yes Hendricks Limes, MD  metoprolol succinate (TOPROL-XL) 25 MG 24 hr tablet Take 25 mg by mouth in the morning and at bedtime.  06/10/19  Yes [provider]  senna (SENOKOT) 8.6 MG tablet Take 1 tablet by mouth every other day.    Yes [provider]  traMADol (ULTRAM) 50 MG tablet 1/2-1 by mouth once daily as needed. Patient taking differently: Take 25-50 mg by mouth 2 (two) times daily as needed for moderate pain or severe pain (1 tablet by mouth scheduled each bedtime.).  10/17/15  Yes Burns, Claudina Lick, MD

## 2019-08-30 ENCOUNTER — Encounter (HOSPITAL_COMMUNITY)
Admission: EM | Disposition: A | Discharge status: Home Health | Payer: Self-pay | Source: Home / Self Care | Attending: Internal Medicine

## 2019-08-30 ENCOUNTER — Inpatient Hospital Stay (HOSPITAL_COMMUNITY): Payer: Medicare Other

## 2019-08-30 DIAGNOSIS — J449 Chronic obstructive pulmonary disease, unspecified: Secondary | ICD-10-CM

## 2019-08-30 DIAGNOSIS — I4891 Unspecified atrial fibrillation: Secondary | ICD-10-CM

## 2019-08-30 DIAGNOSIS — I480 Paroxysmal atrial fibrillation: Secondary | ICD-10-CM

## 2019-08-30 HISTORY — PX: VIDEO BRONCHOSCOPY: SHX5072

## 2019-08-30 HISTORY — PX: BRONCHIAL BRUSHINGS: SHX5108

## 2019-08-30 HISTORY — PX: BRONCHIAL WASHINGS: SHX5105

## 2019-08-30 LAB — CBC
HCT: 35.1 % — ABNORMAL LOW (ref 36.0–46.0)
Hemoglobin: 11.1 g/dL — ABNORMAL LOW (ref 12.0–15.0)
MCH: 29 pg (ref 26.0–34.0)
MCHC: 31.6 g/dL (ref 30.0–36.0)
MCV: 91.6 fL (ref 80.0–100.0)
Platelets: 228 10*3/uL (ref 150–400)
RBC: 3.83 MIL/uL — ABNORMAL LOW (ref 3.87–5.11)
RDW: 13.6 % (ref 11.5–15.5)
WBC: 7 10*3/uL (ref 4.0–10.5)
nRBC: 0 % (ref 0.0–0.2)

## 2019-08-30 LAB — TSH: TSH: 0.262 u[IU]/mL — ABNORMAL LOW (ref 0.350–4.500)

## 2019-08-30 LAB — BASIC METABOLIC PANEL
Anion gap: 6 (ref 5–15)
BUN: 22 mg/dL (ref 8–23)
CO2: 28 mmol/L (ref 22–32)
Calcium: 8.7 mg/dL — ABNORMAL LOW (ref 8.9–10.3)
Chloride: 107 mmol/L (ref 98–111)
Creatinine, Ser: 1.29 mg/dL — ABNORMAL HIGH (ref 0.44–1.00)
GFR calc Af Amer: 45 mL/min — ABNORMAL LOW (ref >60)
GFR calc non Af Amer: 39 mL/min — ABNORMAL LOW (ref >60)
Glucose, Bld: 116 mg/dL — ABNORMAL HIGH (ref 70–99)
Potassium: 3.7 mmol/L (ref 3.5–5.1)
Sodium: 141 mmol/L (ref 135–145)

## 2019-08-30 LAB — BODY FLUID CELL COUNT WITH DIFFERENTIAL
Eos, Fluid: 3 %
Lymphs, Fluid: 4 %
Monocyte-Macrophage-Serous Fluid: 8 % — ABNORMAL LOW (ref 50–90)
Neutrophil Count, Fluid: 85 % — ABNORMAL HIGH (ref 0–25)
Total Nucleated Cell Count, Fluid: 327 cu mm (ref 0–1000)

## 2019-08-30 LAB — LEGIONELLA PNEUMOPHILA SEROGP 1 UR AG: L. pneumophila Serogp 1 Ur Ag: NEGATIVE

## 2019-08-30 LAB — MAGNESIUM: Magnesium: 3.1 mg/dL — ABNORMAL HIGH (ref 1.7–2.4)

## 2019-08-30 SURGERY — VIDEO BRONCHOSCOPY WITHOUT FLUORO
Anesthesia: Moderate Sedation

## 2019-08-30 MED ORDER — DILTIAZEM HCL-DEXTROSE 125-5 MG/125ML-% IV SOLN (PREMIX)
5.0000 mg/h | INTRAVENOUS | Status: DC
  Administered 2019-08-30: 5 mg/h via INTRAVENOUS
  Filled 2019-08-30: qty 125

## 2019-08-30 MED ORDER — PHENOL 1.4 % MT LIQD
1.0000 | OROMUCOSAL | Status: DC | PRN
  Administered 2019-08-30: 1 via OROMUCOSAL
  Filled 2019-08-30: qty 177

## 2019-08-30 MED ORDER — LIP MEDEX EX OINT
TOPICAL_OINTMENT | CUTANEOUS | Status: AC
  Filled 2019-08-30: qty 7

## 2019-08-30 MED ORDER — AZITHROMYCIN 250 MG PO TABS
500.0000 mg | ORAL_TABLET | Freq: Every day | ORAL | Status: AC
  Administered 2019-08-30 – 2019-08-31 (×2): 500 mg via ORAL
  Filled 2019-08-30 (×2): qty 2

## 2019-08-30 MED ORDER — LIDOCAINE HCL URETHRAL/MUCOSAL 2 % EX GEL: 1.0000 "application " | Freq: Once | CUTANEOUS | Status: DC

## 2019-08-30 MED ORDER — BUPIVACAINE-EPINEPHRINE (PF) 0.75% -1:200000 IJ SOLN: INTRAMUSCULAR | Status: DC | PRN

## 2019-08-30 MED ORDER — METOPROLOL TARTRATE 5 MG/5ML IV SOLN
5.0000 mg | Freq: Once | INTRAVENOUS | Status: AC
  Administered 2019-08-30: 5 mg via INTRAVENOUS
  Filled 2019-08-30: qty 5

## 2019-08-30 MED ORDER — PHENYLEPHRINE HCL 0.5 % NA SOLN
NASAL | Status: DC | PRN
  Administered 2019-08-30: 2 [drp] via NASAL

## 2019-08-30 MED ORDER — MIDAZOLAM HCL (PF) 5 MG/ML IJ SOLN
2.0000 mg | Freq: Once | INTRAMUSCULAR | Status: AC
  Administered 2019-08-30: 2 mg via INTRAVENOUS

## 2019-08-30 MED ORDER — FENTANYL CITRATE (PF) 100 MCG/2ML IJ SOLN
INTRAMUSCULAR | Status: AC
  Filled 2019-08-30: qty 2

## 2019-08-30 MED ORDER — PHENYLEPHRINE HCL 0.25 % NA SOLN: 1.0000 | Freq: Four times a day (QID) | NASAL | Status: DC | PRN

## 2019-08-30 MED ORDER — EPINEPHRINE PF 1 MG/ML IJ SOLN: INTRAMUSCULAR | Status: DC | PRN

## 2019-08-30 MED ORDER — MIDAZOLAM HCL (PF) 10 MG/2ML IJ SOLN
INTRAMUSCULAR | Status: DC | PRN
  Administered 2019-08-30 (×2): 1 mg via INTRAVENOUS

## 2019-08-30 MED ORDER — MIDAZOLAM HCL (PF) 5 MG/ML IJ SOLN
INTRAMUSCULAR | Status: AC
Start: 2019-08-30 — End: ?
  Filled 2019-08-30: qty 2

## 2019-08-30 MED ORDER — LIDOCAINE HCL (PF) 1 % IJ SOLN
INTRAMUSCULAR | Status: DC | PRN
  Administered 2019-08-30 (×4): 2 mL

## 2019-08-30 MED ORDER — FENTANYL CITRATE (PF) 100 MCG/2ML IJ SOLN
INTRAMUSCULAR | Status: AC
  Filled 2019-08-30: qty 4

## 2019-08-30 MED ORDER — METOPROLOL TARTRATE 25 MG PO TABS
25.0000 mg | ORAL_TABLET | Freq: Two times a day (BID) | ORAL | Status: DC
  Administered 2019-08-30 – 2019-09-05 (×14): 25 mg via ORAL
  Filled 2019-08-30 (×15): qty 1

## 2019-08-30 MED ORDER — LIDOCAINE HCL URETHRAL/MUCOSAL 2 % EX GEL
CUTANEOUS | Status: DC | PRN
  Administered 2019-08-30: 1

## 2019-08-30 MED ORDER — MAGNESIUM SULFATE 2 GM/50ML IV SOLN
2.0000 g | Freq: Once | INTRAVENOUS | Status: AC
  Administered 2019-08-30: 2 g via INTRAVENOUS
  Filled 2019-08-30: qty 50

## 2019-08-30 MED ORDER — BUTAMBEN-TETRACAINE-BENZOCAINE 2-2-14 % EX AERO
INHALATION_SPRAY | CUTANEOUS | Status: DC | PRN
  Administered 2019-08-30: 2 via TOPICAL

## 2019-08-30 MED ORDER — MIDAZOLAM HCL (PF) 5 MG/ML IJ SOLN
INTRAMUSCULAR | Status: AC
  Filled 2019-08-30: qty 2

## 2019-08-30 MED ORDER — FENTANYL CITRATE (PF) 100 MCG/2ML IJ SOLN
75.0000 ug | Freq: Once | INTRAMUSCULAR | Status: AC
  Administered 2019-08-30: 75 ug via INTRAVENOUS

## 2019-08-30 MED ORDER — FENTANYL CITRATE (PF) 100 MCG/2ML IJ SOLN
INTRAMUSCULAR | Status: DC | PRN
  Administered 2019-08-30 (×2): 12.5 ug via INTRAVENOUS
  Administered 2019-08-30 (×2): 25 ug via INTRAVENOUS

## 2019-08-30 MED ORDER — BUTAMBEN-TETRACAINE-BENZOCAINE 2-2-14 % EX AERO: 1.0000 | INHALATION_SPRAY | Freq: Once | CUTANEOUS | Status: DC

## 2019-08-30 MED ORDER — LEVOTHYROXINE SODIUM 75 MCG PO TABS
75.0000 ug | ORAL_TABLET | Freq: Every day | ORAL | Status: DC
  Administered 2019-08-31 – 2019-09-07 (×7): 75 ug via ORAL
  Filled 2019-08-30 (×8): qty 1

## 2019-08-30 MED ORDER — POTASSIUM CHLORIDE 20 MEQ/15ML (10%) PO SOLN
40.0000 meq | ORAL | Status: AC
  Filled 2019-08-30 (×2): qty 30

## 2019-08-30 NOTE — Consult Note (Addendum)
Cardiology Consultation:   Deborah Shields ID: Tameia Rafferty MRN: [id number]; DOB: March 10, 1941  Admit date: 08/28/2019 Date of Consult: 08/30/2019  Primary Care Provider: Lajean Manes, MD Primary Cardiologist: Sanda Klein, MD  Primary Electrophysiologist:  None    Deborah Shields Profile:   Deborah Shields is a 79 y.o. female with a history of atrial fibrillation in 2012 after shoulder surgency not on anticoagulation due to no recurrence, bilateral carotid artery disease secondary to fibromuscular dysplasia, hypertension, hyperlipidemia, hypothyroidism, and CKD stage III  who is being seen today for the evaluation of atrial fibrillation with RVR at the request of Dr. Lonny Prude.  History of Present Illness:   Deborah Shields is a 79 year old female with the above history who is followed by Dr. Sallyanne Kuster. Deborah Shields had a Myoview in 02/2014 which showed nor perfusion defects. Deborah Shields has not been seen by our office since 11/2016 for follow-up of orthostatic hypotension. She reported doing well since medications were adjusted but did not worsening lower extremity edema. She was started on low dose HCTZ on M/W/F and repeat Echo was ordered.  Echo showed LVEF of 60-65% with normal wall motion and grade 1 diastolic dysfunction. Deborah Shields has not been seen by Cardiology since that time.  Deborah Shields presented to the [facility] ED on 08/28/2019 for further evaluation of hemoptysis. In the ED, Deborah Shields hypertensive with BP as high as the 180's/100's and tachycardic. EKG showed atrial fibrillation, rate 134 bpm, with diffuse ST depression. High-sensitivity troponin 18 >> 17. Chest x-ray showed stable changes consistent with COPD but no acute findings. Chest CTA negative for PE but did show diminished interstitial opacities in the right upper lobe and right lower lobe as well as some ground-glass and centriolobular nodule in the lingula and posterior basal segment of the left lower lobe suggestive of a chronic atypical infectious  process. WBC 6.5, Hgb 12.3, Plts 257. Na 140, K 3.6, Glucose 97, BUN 32, Cr 1.46. Deborah Shields admitted for further evaluation and management of hemoptysis and atrial fibrillation with RVR. She was started on IV Cardizem with improvement in her rates so it was able to be discontinued. Deborah Shields underwent bronchoscopy today which showed a blood clot int he left mainstem bronchus and in the superior lingula segment of the left upper lobe as well as granulation tissue in the left mainstem bronchus. Brushing were obtained. She went back into atrial fibrillation with RVR following bronchoscopy so IV Cardizem was restarted. However, she then developed multiple long pauses > 3 seconds. In fact, she had two 8 second pauses. Cardiology consulted for further evaluation.   At the time of this evaluation, Deborah Shields resting comfortably eating lunch. She reports sudden onset on hemoptysis on Friday. She denies any recent fevers or illnesses. She notes some shortness of breath with activity but none at rest. No chest pain. She does report episodes of palpitations at home with associated dizziness and shortness of breath. I wonder if she is having pauses or atrial fibrillation at home. No falls or syncope. No orthopnea, PND, or lower extremity edema. No hematochezia, melena, or hematuria.   Heart Pathway Score:     Past Medical History:  Diagnosis Date   Anxiety    Aortic insufficiency    mild due to degenerative changes   Arthritis    Basal cell carcinoma 2007   [facility] Left leg   Carotid artery occlusion    Chronic kidney disease    CRD Stage 3   Conjunctivitis due to adenovirus, both eyes  Degenerative joint disease    Diverticulosis of colon    Dyspnea    with exertion   Heart murmur    Hyperlipidemia    NMR 07/2009: LDL 200 (2260/1233)TG 99, HDL 65. LDL goal =<120, ideally <100. father MI @ 89   Hypertension    Hypothyroidism    Dr Wilson Singer   Microscopic hematuria    Peripheral neuropathy    compressive  in UE bilaterally; Dr Daylene Katayama   Peripheral vascular disease ([place])    ICA bilat, Dr.Charles fields, [facility]   Pneumonia    2017   PONV (postoperative nausea and vomiting)    "Inner ear, does  okay with Scopolamine"   Pulmonary embolus ([place])    Rectocele    [place] spotted fever     Past Surgical History:  Procedure Laterality Date   ABDOMINAL HYSTERECTOMY  1973   age 32 due to dysfuctional menses; HRT x 25-30 years   APPENDECTOMY  1952   basal cell cancer  03/2006   leg   BILATERAL OOPHORECTOMY  1990   prophylactically (sister had ovarian ca)   CARPAL TUNNEL RELEASE Bilateral 1989   right   CATARACT EXTRACTION, BILATERAL  2010   Dr Kathrin Penner   CHOLECYSTECTOMY  2006   COLONOSCOPY     Dr Ardis Hughs   EYE SURGERY Left    Bilateral eye (film removed lt eye 01/09/17)   LUMBAR LAMINECTOMY/DECOMPRESSION MICRODISCECTOMY Left 03/20/2017   Procedure: LEFT LUMBAR FOURLUMBAR FIVE LAMINOTOMY AND MICROICRODISCECTOMY 1 LEVEL;  Surgeon: Jovita Gamma, MD;  Location: [facility];  Service: Neurosurgery;  Laterality: Left;  LEFT   NM MYOVIEW LTD  06/13/2008   low risk scan   ORTHOPEDIC SURGERY  1989/200/2002/2012   elbows,shoulder surgery x 3, right hand   RECTOCELE REPAIR  2016   TONSILLECTOMY  1957   US ECHOCARDIOGRAPHY  05/01/2010   trace MR,AI,TR;EF =>55%   VIDEO BRONCHOSCOPY Bilateral 07/17/2017   Procedure: VIDEO BRONCHOSCOPY WITHOUT FLUORO;  Surgeon: Rigoberto Noel, MD;  Location: [facility];  Service: Cardiopulmonary;  Laterality: Bilateral;   VIDEO BRONCHOSCOPY Bilateral 09/02/2017   Procedure: VIDEO BRONCHOSCOPY WITHOUT FLUORO;  Surgeon: Rigoberto Noel, MD;  Location: [facility];  Service: Cardiopulmonary;  Laterality: Bilateral;     Home Medications:  Prior to Admission medications   Medication Sig Start Date End Date Taking? Authorizing Provider  b complex vitamins tablet Take 1 tablet by mouth daily after supper.    Yes [provider]  cholecalciferol (VITAMIN D3) 25  MCG (1000 UNIT) tablet Take 1,000 Units by mouth daily.   Yes [provider]  cyclobenzaprine (FLEXERIL) 10 MG tablet Take 10 mg by mouth 2 (two) times daily as needed for muscle spasms (scheduled every every).    Yes [provider]  diclofenac sodium (VOLTAREN) 1 % GEL Apply 2-4 g topically 4 (four) times daily as needed (for pain).    Yes [provider]  levothyroxine (SYNTHROID) 88 MCG tablet Take 88 mcg by mouth daily before breakfast. Taking the brand "synthroid"   Yes [provider]  LORazepam (ATIVAN) 0.5 MG tablet Take 1 tablet (0.5 mg total) by mouth 3 (three) times daily as needed. Deborah Shields taking differently: Take 0.5 mg by mouth at bedtime.  10/17/15  Yes Burns, Claudina Lick, MD  meclizine (ANTIVERT) 25 MG tablet Take 1 tablet (25 mg total) by mouth daily. 1/2 every 8 hrs prn for imbalance Deborah Shields taking differently: Take 12.5-25 mg by mouth 2 (two) times daily as needed for dizziness.  11/01/10  Yes Hendricks Limes, MD  metoprolol succinate (TOPROL-XL) 25 MG 24 hr tablet Take 25 mg by mouth in the morning and at bedtime.  06/10/19  Yes [provider]  senna (SENOKOT) 8.6 MG tablet Take 1 tablet by mouth every other day.    Yes [provider]  traMADol (ULTRAM) 50 MG tablet 1/2-1 by mouth once daily as needed. Deborah Shields taking differently: Take 25-50 mg by mouth 2 (two) times daily as needed for moderate pain or severe pain (1 tablet by mouth scheduled each bedtime.).  10/17/15  Yes Burns, Claudina Lick, MD    Inpatient Medications: Scheduled Meds:  acidophilus   Oral QPC breakfast   B-complex with vitamin C  1 tablet Oral QPC supper   Chlorhexidine Gluconate Cloth  6 each Topical Daily   Chlorhexidine Gluconate Cloth  6 each Topical Q0600   chlorpheniramine-HYDROcodone  5 mL Oral Q12H   levothyroxine  88 mcg Oral QAC breakfast   LORazepam  0.5 mg Oral QHS   meclizine  12.5-25 mg Oral QHS   metoprolol succinate  25 mg Oral BID PC    multivitamin with minerals  1 tablet Oral QPC supper   mupirocin ointment  1 application Nasal BID   senna  1 tablet Oral Daily   Continuous Infusions:  cefTRIAXone (ROCEPHIN)  IV Stopped (08/30/19 1140)   diltiazem (CARDIZEM) infusion 7.5 mg/hr (08/30/19 1143)   PRN Meds: acetaminophen **OR** acetaminophen, albuterol, cyclobenzaprine, guaiFENesin-dextromethorphan, ondansetron **OR** ondansetron (ZOFRAN) IV, traMADol  Allergies:    Allergies  Allergen Reactions   Aspirin Nausea Only and Other (See Comments)    REACTION: GI upset  ( pt can take 81 mg but NOT   325 mg ASA )   Lovastatin Nausea Only and Other (See Comments)    REACTION: nausea   Benazepril Hcl Other (See Comments)    Unknown   Paroxetine Other (See Comments)    Unknown   Tape Other (See Comments)   Bupropion Hcl Other (See Comments)    REACTION: tinnitis   Ezetimibe Nausea And Vomiting and Other (See Comments)    REACTION: GI symptoms   Fenofibrate Other (See Comments)    Myalgia    Pravastatin Sodium Other (See Comments)    REACTION: elevated CPK - Muscle's in bilateral Leg    Social History:   Social History   Socioeconomic History   Marital status: Widowed    Spouse name: Not on file   Number of children: 1   Years of education: Not on file   Highest education level: Not on file  Occupational History   Occupation: retired  Tobacco Use   Smoking status: Never Smoker   Smokeless tobacco: Never Used  Substance and Sexual Activity   Alcohol use: No    Alcohol/week: 0.0 standard drinks   Drug use: No   Sexual activity: Not on file  Other Topics Concern   Not on file  Social History Narrative   Low cholesterol diet   Regular exercise- yes    Social Determinants of Health   Financial Resource Strain:    Difficulty of Paying Living Expenses: Not on file  Food Insecurity:    Worried About Charity fundraiser in the Last Year: Not on file   [employer] of Food in the Last Year: Not on file    Transportation Needs:    Lack of Transportation (Medical): Not on file   Lack of Transportation (Non-Medical): Not on file  Physical Activity:  Days of Exercise per Week: Not on file   Minutes of Exercise per Session: Not on file  Stress:    Feeling of Stress : Not on file  Social Connections:    Frequency of Communication with Friends and Family: Not on file   Frequency of Social Gatherings with Friends and Family: Not on file   Attends Religious Services: Not on file   Active Member of Clubs or Organizations: Not on file   Attends Archivist Meetings: Not on file   Marital Status: Not on file  Intimate Partner Violence:    Fear of Current or Ex-Partner: Not on file   Emotionally Abused: Not on file   Physically Abused: Not on file   Sexually Abused: Not on file    Family History:   Family History  Problem Relation Age of Onset   Heart attack Father 20   Heart disease Father        Before age 35   Colon cancer Mother 7   Stroke Mother 21   Kidney disease Mother        ? renal calculi; S/P resecton of kidney   Cancer - Colon Mother 43   Ovarian cancer Sister    Other Sister        Valve replacement (aortic & mitral ) in 2 sisters   Heart disease Sister        Before age 60   Heart disease Brother        aortic & mitral valve replacement in 2 bro; both had CBAG   Diabetes Neg Hx    Breast cancer Neg Hx      ROS:  Please see the history of present illness.  Review of Systems  Constitutional: Negative for fever.  HENT: Negative for congestion.   Respiratory: Positive for hemoptysis and shortness of breath.   Cardiovascular: Positive for chest pain and palpitations. Negative for orthopnea and PND.  Gastrointestinal: Negative for blood in stool and melena.  Genitourinary: Negative for hematuria.  Musculoskeletal: Negative for falls.  Neurological: Positive for dizziness. Negative for loss of consciousness.  Endo/Heme/Allergies: Environmental allergies:  bruises easily. Bruises/bleeds easily.   Physical Exam/Data:   Vitals:   08/30/19 0915 08/30/19 0920 08/30/19 0922 08/30/19 1000  BP:  (!) 169/110 (!) 154/109 (!) 137/115  Pulse: (!) 133 (!) 129 (!) 141 64  Resp: _0 Temp:      TempSrc:      SpO2: 95% 97% 97% 94%  Weight:      Height:        Intake/Output Summary (Last 24 hours) at 08/30/2019 1148 Last data filed at 08/30/2019 1143 Gross per 24 hour  Intake 259.36 ml  Output --  Net 259.36 ml   Last 3 Weights 08/28/2019 08/28/2019 02/23/2019  Weight (lbs) 110 lb 0.2 oz 114 lb 114 lb  Weight (kg) 49.9 kg 51.71 kg 51.71 kg     Body mass index is 17.76 kg/m.  General: 79 y.o. female resting comfortably in no acute distress. HEENT: Normocephalic and atraumatic.  Neck: Supple. No carotid bruits. No JVD. Heart: RRR. Distinct S1 and S2. II-III/VI systolic murmur. No gallops or rubs. Radial pulses 2+ and equal bilaterally. Lungs: No increased work of breathing. Mild crackles noted in bilateral bases.  Abdomen: Soft, non-distended, and non-tender to palpation. Bowel sounds present. Extremities: No lower extremity edema.    Skin: Warm and dry. Neuro: Alert and oriented x3. No focal deficits. Psych: Normal affect. Responds  appropriately.   EKG:  The EKG was personally reviewed and demonstrates:  Atrial fibrillation, rate 134 bpm, with diffuse ST depression. Telemetry:  Telemetry was personally reviewed and demonstrates:  Atrial fibrillation with rates in the 120's to 130's but has since converted to sinus rhythm with rates in the 70's to 80's. Multiple post conversion pauses noted - two 8 seconds pauses and two 4 second pauses.   Relevant CV Studies:  Exercise Myoview 03/09/2014: Impression: Exercise Capacity:  Lexiscan with no exercise. BP Response:  Normal blood pressure response. Clinical Symptoms:  Mild chest pain/dyspnea. ECG Impression:  No significant ST segment change suggestive of ischemia. Comparison with Prior  Nuclear Study: No significant change from previous study   Overall Impression:  Normal stress nuclear study.   LV Wall Motion:  NL LV Function, EF 67%; NL Wall Motion _______________  Echocardiogram 12/23/2016: Study Conclusions: - Left ventricle: The cavity size was normal. Wall thickness was    normal. Systolic function was normal. The estimated ejection    fraction was in the range of 60% to 65%. Wall motion was normal;    there were no regional wall motion abnormalities. Doppler    parameters are consistent with abnormal left ventricular    relaxation (grade 1 diastolic dysfunction). The E/e&' ratio is    between 8-15, suggesting indeterminate LV filling pressure.  - Aortic valve: Trileaflet. Sclerosis without stenosis.    Transvalvular velocity was minimally increased. There was trivial    regurgitation. Mean gradient (S): 8 mm Hg. Peak gradient (S): 17    mm Hg.  - Mitral valve: Mildly thickened leaflets . There was trivial    regurgitation.  - Left atrium: The atrium was normal in size.  - Tricuspid valve: There was mild regurgitation.  - Pulmonary arteries: PA peak pressure: 27 mm Hg (S).  - Inferior vena cava: The vessel was normal in size. The    respirophasic diameter changes were in the normal range (>= 50%),    consistent with normal central venous pressure.   Impressions:  - Compared to a prior study in 2015, the LVEF is stable at 60-65%.  _______________  Carotid Ultrasounds 01/12/2019: Summary:  - Right Carotid: Velocities in the right ICA are consistent with a 1-39%  stenosis. Elevated velocity n the distal ICA is likely secondary to a  kink in the vessel.  - Left Carotid: Velocities in the left ICA are consistent with a 1-39%  stenosis.Elevated velocity in the distal ICA is likely secondary to tortuosity or suggestive of fibromuscular dysplasia.   Laboratory Data:  High Sensitivity Troponin:   Recent Labs  Lab 08/28/19 1247 08/28/19 1447  TROPONINIHS 18*  17     Chemistry Recent Labs  Lab 08/28/19 1247 08/29/19 0316  NA 140 137  K 3.6 3.6  CL 104 104  CO2 28 24  GLUCOSE 97 100*  BUN 32* 32*  CREATININE 1.46* 1.28*  CALCIUM 8.9 8.7*  GFRNONAA 34* 39*  GFRAA 39* 46*  ANIONGAP 8 9    Recent Labs  Lab 08/28/19 1315  PROT 7.0  ALBUMIN 3.5  AST 27  ALT 15  ALKPHOS 90  BILITOT 1.1   Hematology Recent Labs  Lab 08/28/19 1247 08/29/19 0316 08/30/19 0553  WBC 6.5 7.8 7.0  RBC 4.15 3.69* 3.83*  HGB 12.3 10.8* 11.1*  HCT 38.7 34.2* 35.1*  MCV 93.3 92.7 91.6  MCH 29.6 29.3 29.0  MCHC 31.8 31.6 31.6  RDW 13.2 13.4 13.6  PLT 257 220  228   BNPNo results for input(s): BNP, PROBNP in the last 168 hours.  DDimer No results for input(s): DDIMER in the last 168 hours.   Radiology/Studies:  CT Angio Chest PE W and/or Wo Contrast  Result Date: 08/28/2019 CLINICAL DATA:  Hemoptysis since 2 p.m. EXAM: CT ANGIOGRAPHY CHEST WITH CONTRAST TECHNIQUE: Multidetector CT imaging of the chest was performed using the standard protocol during bolus administration of intravenous contrast. Multiplanar CT image reconstructions and MIPs were obtained to evaluate the vascular anatomy. CONTRAST:  166m OMNIPAQUE IOHEXOL 350 MG/ML SOLN COMPARISON:  CT 08/16/2018 FINDINGS: Cardiovascular: Satisfactory opacification the pulmonary arteries to the segmental level. No pulmonary artery filling defects are identified. Central pulmonary arteries are normal caliber. Normal heart size. No pericardial effusion. Mild mass effect upon the right heart by a moderate pectus deformity of the chest (Haller index 3.5). Atherosclerotic plaque within the normal caliber aorta. Normal 3 vessel branching of the aortic arch. Proximal great vessels are mildly calcified. Mediastinum/Nodes: No mediastinal fluid or gas. Normal thyroid gland and thoracic inlet. No acute abnormality of the trachea. Slightly patulous appearance of the thoracic esophagus but otherwise unremarkable. No  worrisome mediastinal, hilar or axillary adenopathy. Diminishing size of right hilar now measuring 7 mm in size (5/60). Lungs/Pleura: There is centrilobular and paraseptal emphysema better seen on comparison studies with less respiratory motion. Biapical pleuroparenchymal scarring is similar to the priors with some cicatricial/traction bronchiectasis there is diminished interstitial opacities in the right upper lobe when compared to prior. Chronic volume loss and scarring in the middle lobe is again seen. Diminished ground-glass and consolidative opacity lateral basal segments right lower lobe. Some ground-glass and centrilobular nodules are present within the lingula as well as some more focal areas of ground-glass attenuation in the posterior basal segment of the left lower lobe. Upper Abdomen: No acute abnormalities present in the visualized portions of the upper abdomen. Deborah Shields is post cholecystectomy. Mild atheromatous plaque in the upper abdomen. Musculoskeletal: Multilevel degenerative changes are present in the imaged portions of the spine. Marked sclerotic degenerative changes of the lower thoracic spine and thoracolumbar junction are similar to prior. Review of the MIP images confirms the above findings. IMPRESSION: 1. No acute pulmonary artery filling defects identified. 2. Diminishing size of right hilar lymph node now measuring 7 mm in size. 3. Diminished interstitial opacities in the right upper lobe and right lower lobe. 4. Some ground-glass and centrilobular nodules in the lingula and posterior basal segment of the left lower lobe appearance of features is suggestive of a chronic atypical infectious process (including etiologies such as atypical mycobacterial infection/MAI). 5. Moderate pectus deformity of the chest with associated mild mass effect upon the right heart. 6. Aortic Atherosclerosis (ICD10-I70.0). Electronically Signed   By: PLovena LeM.D.   On: 08/28/2019 15:22   DG Chest Port 1  View  Result Date: 08/28/2019 CLINICAL DATA:  Hemoptysis since 2 a.m. today. EXAM: PORTABLE CHEST 1 VIEW COMPARISON:  09/11/2018 FINDINGS: Normal sized heart. Tortuous and partially calcified thoracic aorta. Clear lungs with normal vascularity. The lungs remain hyperexpanded. Diffuse osteopenia. IMPRESSION: No acute abnormality. Stable changes of COPD. Electronically Signed   By: SClaudie ReveringM.D.   On: 08/28/2019 13:52     Assessment and Plan:   Atrial Fibrillation with RVR - Deborah Shields presented with hemoptysis and was found to be in atrial fibrillation. History of post-op atrial fibrillation in 2012 following shoulder surgery but this was an isolated event so Deborah Shields not on any anticoagulation.  -  Deborah Shields converted back to normal sinus rhythm with IV Cardizem. However, Deborah Shields had multiple long post-conversion pauses (two 8 second pauses and two 4 second pauses). - Potassium 3.6 yesterday. Goal >4.0. Will recheck today. Supplement if needed. - Will check Magnesium and TSH. - Will check Echo.  - Will discontinue IV Cardizem.  - Will start Lopressor 62m twice daily (on Toprol-XL 269mtwice daily at home). - CHA2DS2-VASc = 5 ( HTN, prior PE, age x2, female). Recommend DOAC after hemoptysis has been fully evaluated and resolved. Restart when OK with Pulmonology.  - Will continue to monitor on telemetry while here. Will likely need outpatient monitor at discharge.   Carotid Artery Disease - Most recent carotid ultrasounds in 12/2018 showed 1-39% stenosis of bilateral ICAs. Elevated velocity in distal left ICA suggestive of fibromuscular dysplasia.  - Aspirin currently on hold due to hemoptysis.  - Intolerant to statins. - Followed by Vascular Surgery.   Hypertension - BP elevated at 137/115.  - Continue Lopressor as above.  CKD Stage III - Creatinine 1.28 yesterday. Baseline appears to be around 1.2 to 1.4. BUN elevated at 32.  - Continue to monitor closely.   Hemoptysis - Deborah Shields  underwent bronchoscopy today which showed blood clot int he left mainstem bronchus and in the superior lingula segment of the left upper lobe as well as granulation tissue in the left mainstem bronchus. Brushing were obtained. Final results pending. - Management per primary team.   For questions or updates, please contact [facility] consult [email or web address] for contact info under     Signed, CaDarreld McleanPA-C  08/30/2019 11:48 AM  Deborah Shields examined chart reviewed Discussed care with Deborah Shields, daughter TaLynelle Smokend PA. Exam with pale frail elderly female , lungs clear with no active wheezing no carotid bruits and mild SEM. No edema. Telemetry with rapid afib and long conversion pauses 8-10 seconds Currently in NSR rates 65-75 bpm Rx of PAF complicated by recurrent admission for hemoptysis year and a half ago had similar presentation Had bronchoscopy today with clearing of clot and material from left main bronchus BAL washings pending. Currently not a candidate for anticoagulation. May benefit from AAT such as flecainide. If she has recurrence. She was on LA metoprolol at home and will change to low dose SA Would not use dual Rx on AV node with calcium blocker given SSS and long pauses. If she remains in NSR will [facility]u with Dr C and EP. Will update echo as well.   PeJenkins RougeD FAKaweah [facility]

## 2019-08-30 NOTE — Evaluation (Signed)
SLP Cancellation Note  Patient Details Name: Deborah Shields MRN: [id number] DOB: 1940/08/28   Cancelled treatment:       Reason Eval/Treat Not Completed: Other (comment);Medical issues which prohibited therapy(pt npo at this time, note transfer to ICU after bronch)  [name], MS [place] [facility] [phone]   Macario Golds 08/30/2019, 10:02 AM

## 2019-08-30 NOTE — Progress Notes (Signed)
   NAME:  Deborah Shields, MRN:  [id number], DOB:  1940-07-31, LOS: 2 ADMISSION DATE:  08/28/2019, CONSULTATION DATE:  08/29/19 REFERRING MD:  Lonny Prude, CHIEF COMPLAINT:  Hemoptysis   Brief History   79 year old woman w/ hx PAD p/w hemoptysis.  History of present illness   79 year old woman w/ hx PAD p/w hemoptysis.  Fair amount estimate ~100cc's yesterday.  None this AM.  Had this issue previously, originated from lingula, cultures grew MRSA at that time.  Sputum sent for culture this time.  PCCM consulted for potential bronchoscopy.  Patient does have some unintentional weight loss in 2017 that was attributed to tickborne illness, this has since stabilized.  On baby aspirin at home.  No recent sick contacts.  No infectious symptoms.  Admits to trouble swallowing that GI has evaluated her for and neg workup including EGD.  Past Medical History  PAD Prior PE Arthritis   Significant Hospital Events   08/28/19 admitted  Consults:  PCCM  Procedures:  N/A  Significant Diagnostic Tests:  CTA chest- lingular infiltrate  Micro Data:  Sputum>>  Antimicrobials:  Received vanc/ceftriaxone/azithromycin in ER   Interim history/subjective:  Afib this AM. Some palpitations. HD stable. Seen in endo. Some blood tinged sputum but overall much improved.  Objective   Blood pressure 119/80, pulse (!) 125, temperature 98.6 F (37 C), temperature source Oral, resp. rate 18, height _0  (1.676 m), weight 49.9 kg, SpO2 96 %.       No intake or output data in the 24 hours ending 08/30/19 0713 Filed Weights   08/28/19 1241 08/28/19 1800  Weight: 51.7 kg 49.9 kg    Examination: General: thin woman in NAD HENT: MM dry, trachea midline Lungs: Clear, no wheezing Cardiovascular: RRR, +murmur, ext warm Abdomen: Soft, +BS Extremities: Advanced arthritic changes Neuro: Moves all 4 ext to command Psych: AOx3, good insight    Resolved Hospital Problem list   N/A  Assessment & Plan:    Hemoptysis, abnormal CT- something is probably colonized in her lingula and causing issues.  AFB neg in 2019.  Had MRSA growing there before. - Hold all abx - Bronch/BAL/brushing today - Monitor and record amount of hemoptysis - If hemoptysis resolves we can do OP f/u after bronch   Dysphagia- should have SLP see to complete workup: she denied dysphagia to SLP, normal diet recommended  Afib- new (maybe, daughter and her thinks she may have had this diagnosis under control), need to get hemoptysis resolved prior to any [facility] challenge.  CHADS2vasc score 5, should be on NoAC.  Giving 47m IV metoprolol, replete K/Mg and continue PTA PO metoprolol.   DErskine EmeryMD PCCM

## 2019-08-30 NOTE — Progress Notes (Signed)
Called to give report to Margarita Grizzle in ICU, she stated she received report from PACU.

## 2019-08-30 NOTE — Progress Notes (Addendum)
PROGRESS NOTE    Deborah Shields  ZOX:[id number] DOB: 12/04/1940 DOA: 08/28/2019 PCP: Lajean Manes, MD   Brief Narrative: Deborah Shields is a 79 y.o. female with medical history significant of stage III chronic kidney disease, dyslipidemia, hypertension, arthritis, and paroxysmal atrial fibrillation. Patient presented secondary to hemoptysis and found to have likely atypical pneumonia. Patient started on empiric Vancomycin, Ceftriaxone and Azithromycin. She was also found to have afib with RVR and started on a diltiazem drip which was discontinued. Patient underwent bronchoscopy on 3/8 which was significant for blood/clot and BAL performed. Patient returned to atrial fibrillation with resultant rapid ventricular response.   Assessment & Plan:   Principal Problem:   Hemoptysis Active Problems:   Hypothyroidism   HYPERLIPIDEMIA   Essential hypertension   PAF (paroxysmal atrial fibrillation) (HCC)   COPD with chronic bronchitis and emphysema (HCC)   Pneumonia   Hemoptysis Patient with previous history two years ago with blood clots found on video bronchoscopy. Concern for atypical pneumonia on current admission. No significant recurrent hemoptysis -Pulmonology recommendations: await BAL and brushings results -Hold aspirin -Daily CBC  Ground glass lung opacities Presumed atypical lung infection. Started on Ceftriaxone, Vancomycin and Azithromycin in the ED and continued on Ceftriaxone on admission. AFB sputum smear and culture ordered and are pending -Continue Ceftriaxone and continue Azithromycin  Paroxysmal atrial fibrillation with RVR Rapid rates in the ED secondary to patient not taking her metoprolol that day. Started on diltiazem drip on admission. Initially resolved and maintained on home metoprolol. After bronchoscopy, patient returned to atrial fibrillation with resultant rapid ventricular response. Per discussion with daughter, concern is patient is frequently in  RVR secondary to frequent episodes of fatigue/palpitations as an outpatient. Patient's family has a long history of heart disease including arrhythmia requiring pace maker. -Continue diltiazem drip -Continue home metoprolol -Consult cardiology  Hypothyroidism -Continue Synthroid 88 mcg daily  Anxiety -Continue Ativan prn  CKD stage IIIb Stable.   DVT prophylaxis: SCDs Code Status:   Code Status: Full Code Family Communication: Daughter at bedside Disposition Plan: Discharge pending continued management of atrial fibrillation in addition to BAL results   Consultants:   Pulmonology  Cardiology  Procedures:   BRONCHOSCOPY (08/30/19) Impression:      - Hemoptysis      - The airway examination of the right lung was normal.      - A blood clot was found in the left mainstem bronchus and in the       superior lingula segment of the left upper lobe.      - Granulation tissue was found in the left mainstem bronchus.      - Brushings were obtained.      - Bronchoalveolar lavage was performed. Moderate Sedation:      Moderate (conscious) sedation was administered by the nurse and       supervised by the physician performing the procedure. The following       parameters were monitored: oxygen saturation, heart rate, blood       pressure, and response to care. Total physician intraservice time was 25       minutes. Recommendation:      - Await BAL and brushing results.  Moderate Sedation: - Await BAL and brushing results.  Antimicrobials:  Ceftriaxone  Vancomycin  Azithromycin    Subjective: Patient reports some mild hemoptysis overnight but more described as specks. No other issues.  Objective: Vitals:   08/30/19 0915 08/30/19 0920 08/30/19 0922 08/30/19 1000  BP:  Marland Kitchen)  169/110 (!) 154/109 (!) 137/115  Pulse: (!) 133 (!) 129 (!) 141 64  Resp: _0 Temp:      TempSrc:      SpO2: 95% 97% 97% 94%  Weight:      Height:        Intake/Output Summary (Last  24 hours) at 08/30/2019 1147 Last data filed at 08/30/2019 1143 Gross per 24 hour  Intake 259.36 ml  Output --  Net 259.36 ml   Filed Weights   08/28/19 1241 08/28/19 1800  Weight: 51.7 kg 49.9 kg    Examination:  General exam: Appears calm and comfortable Respiratory system: Clear to auscultation. Respiratory effort normal. Cardiovascular system: S1 & S2 heard, Irregular rhythm with fast rate. Gastrointestinal system: Abdomen is nondistended, soft and nontender. No organomegaly or masses felt. Normal bowel sounds heard. Central nervous system: Alert and oriented. No focal neurological deficits. Extremities: No edema. No calf tenderness Skin: No cyanosis. No rashes Psychiatry: Judgement and insight appear normal. Mood & affect appropriate.      Data Reviewed: I have personally reviewed following labs and imaging studies  CBC: Recent Labs  Lab 08/28/19 1247 08/29/19 0316 08/30/19 0553  WBC 6.5 7.8 7.0  HGB 12.3 10.8* 11.1*  HCT 38.7 34.2* 35.1*  MCV 93.3 92.7 91.6  PLT 257 220 601   Basic Metabolic Panel: Recent Labs  Lab 08/28/19 1247 08/29/19 0316  NA 140 137  K 3.6 3.6  CL 104 104  CO2 28 24  GLUCOSE 97 100*  BUN 32* 32*  CREATININE 1.46* 1.28*  CALCIUM 8.9 8.7*   GFR: Estimated Creatinine Clearance: 27.6 mL/min (A) (by C-G formula based on SCr of 1.28 mg/dL (H)). Liver Function Tests: Recent Labs  Lab 08/28/19 1315  AST 27  ALT 15  ALKPHOS 90  BILITOT 1.1  PROT 7.0  ALBUMIN 3.5   No results for input(s): LIPASE, AMYLASE in the last 168 hours. No results for input(s): AMMONIA in the last 168 hours. Coagulation Profile: Recent Labs  Lab 08/28/19 1315  INR 1.0   Cardiac Enzymes: No results for input(s): CKTOTAL, CKMB, CKMBINDEX, TROPONINI in the last 168 hours. BNP (last 3 results) No results for input(s): PROBNP in the last 8760 hours. HbA1C: No results for input(s): HGBA1C in the last 72 hours. CBG: No results for input(s): GLUCAP in the  last 168 hours. Lipid Profile: No results for input(s): CHOL, HDL, LDLCALC, TRIG, CHOLHDL, LDLDIRECT in the last 72 hours. Thyroid Function Tests: No results for input(s): TSH, T4TOTAL, FREET4, T3FREE, THYROIDAB in the last 72 hours. Anemia Panel: No results for input(s): VITAMINB12, FOLATE, FERRITIN, TIBC, IRON, RETICCTPCT in the last 72 hours. Sepsis Labs: No results for input(s): PROCALCITON, LATICACIDVEN in the last 168 hours.  Recent Results (from the past 240 hour(s))  SARS CORONAVIRUS 2 (TAT 6-24 HRS) Nasopharyngeal Nasopharyngeal Swab     Status: None   Collection Time: 08/28/19  3:09 PM   Specimen: Nasopharyngeal Swab  Result Value Ref Range Status   SARS Coronavirus 2 NEGATIVE NEGATIVE Final    Comment: (NOTE) SARS-CoV-2 target nucleic acids are NOT DETECTED. The SARS-CoV-2 RNA is generally detectable in upper and lower respiratory specimens during the acute phase of infection. Negative results do not preclude SARS-CoV-2 infection, do not rule out co-infections with other pathogens, and should not be used as the sole basis for treatment or other patient management decisions. Negative results must be combined with clinical observations, patient history, and epidemiological information. The expected  result is Negative. Fact Sheet for Patients: SugarRoll.be Fact Sheet for Healthcare Providers: https://www.woods-mathews.com/ This test is not yet approved or cleared by the [place] FDA and  has been authorized for detection and/or diagnosis of SARS-CoV-2 by FDA under an Emergency Use Authorization (EUA). This EUA will remain  in effect (meaning this test can be used) for the duration of the COVID-19 declaration under Section 56 4(b)(1) of the Act, 21 U.S.C. section 360bbb-3(b)(1), unless the authorization is terminated or revoked sooner. Performed at [facility], [place] [address], [place], [place] [address]   MRSA PCR  Screening     Status: Abnormal   Collection Time: 08/28/19  5:20 PM   Specimen: Nasal Mucosa; Nasopharyngeal  Result Value Ref Range Status   MRSA by PCR POSITIVE (A) NEGATIVE Final    Comment:        The GeneXpert MRSA Assay (FDA approved for NASAL specimens only), is one component of a comprehensive MRSA colonization surveillance program. It is not intended to diagnose MRSA infection nor to guide or monitor treatment for MRSA infections. RESULT CALLED TO, READ BACK BY AND VERIFIED WITH: Susann Givens RN 1911 08/28/19 JM Performed at [facility], [place] [address] [place], [place] [address]          Radiology Studies: CT Angio Chest PE W and/or Wo Contrast  Result Date: 08/28/2019 CLINICAL DATA:  Hemoptysis since 2 p.m. EXAM: CT ANGIOGRAPHY CHEST WITH CONTRAST TECHNIQUE: Multidetector CT imaging of the chest was performed using the standard protocol during bolus administration of intravenous contrast. Multiplanar CT image reconstructions and MIPs were obtained to evaluate the vascular anatomy. CONTRAST:  182m OMNIPAQUE IOHEXOL 350 MG/ML SOLN COMPARISON:  CT 08/16/2018 FINDINGS: Cardiovascular: Satisfactory opacification the pulmonary arteries to the segmental level. No pulmonary artery filling defects are identified. Central pulmonary arteries are normal caliber. Normal heart size. No pericardial effusion. Mild mass effect upon the right heart by a moderate pectus deformity of the chest (Haller index 3.5). Atherosclerotic plaque within the normal caliber aorta. Normal 3 vessel branching of the aortic arch. Proximal great vessels are mildly calcified. Mediastinum/Nodes: No mediastinal fluid or gas. Normal thyroid gland and thoracic inlet. No acute abnormality of the trachea. Slightly patulous appearance of the thoracic esophagus but otherwise unremarkable. No worrisome mediastinal, hilar or axillary adenopathy. Diminishing size of right hilar now measuring 7 mm in size  (5/60). Lungs/Pleura: There is centrilobular and paraseptal emphysema better seen on comparison studies with less respiratory motion. Biapical pleuroparenchymal scarring is similar to the priors with some cicatricial/traction bronchiectasis there is diminished interstitial opacities in the right upper lobe when compared to prior. Chronic volume loss and scarring in the middle lobe is again seen. Diminished ground-glass and consolidative opacity lateral basal segments right lower lobe. Some ground-glass and centrilobular nodules are present within the lingula as well as some more focal areas of ground-glass attenuation in the posterior basal segment of the left lower lobe. Upper Abdomen: No acute abnormalities present in the visualized portions of the upper abdomen. Patient is post cholecystectomy. Mild atheromatous plaque in the upper abdomen. Musculoskeletal: Multilevel degenerative changes are present in the imaged portions of the spine. Marked sclerotic degenerative changes of the lower thoracic spine and thoracolumbar junction are similar to prior. Review of the MIP images confirms the above findings. IMPRESSION: 1. No acute pulmonary artery filling defects identified. 2. Diminishing size of right hilar lymph node now measuring 7 mm in size. 3. Diminished interstitial opacities in the right  upper lobe and right lower lobe. 4. Some ground-glass and centrilobular nodules in the lingula and posterior basal segment of the left lower lobe appearance of features is suggestive of a chronic atypical infectious process (including etiologies such as atypical mycobacterial infection/MAI). 5. Moderate pectus deformity of the chest with associated mild mass effect upon the right heart. 6. Aortic Atherosclerosis (ICD10-I70.0). Electronically Signed   By: Lovena Le M.D.   On: 08/28/2019 15:22   DG Chest Port 1 View  Result Date: 08/28/2019 CLINICAL DATA:  Hemoptysis since 2 a.m. today. EXAM: PORTABLE CHEST 1 VIEW  COMPARISON:  09/11/2018 FINDINGS: Normal sized heart. Tortuous and partially calcified thoracic aorta. Clear lungs with normal vascularity. The lungs remain hyperexpanded. Diffuse osteopenia. IMPRESSION: No acute abnormality. Stable changes of COPD. Electronically Signed   By: Claudie Revering M.D.   On: 08/28/2019 13:52        Scheduled Meds: . acidophilus   Oral QPC breakfast  . B-complex with vitamin C  1 tablet Oral QPC supper  . Chlorhexidine Gluconate Cloth  6 each Topical Daily  . Chlorhexidine Gluconate Cloth  6 each Topical Q0600  . chlorpheniramine-HYDROcodone  5 mL Oral Q12H  . levothyroxine  88 mcg Oral QAC breakfast  . LORazepam  0.5 mg Oral QHS  . meclizine  12.5-25 mg Oral QHS  . metoprolol succinate  25 mg Oral BID PC  . multivitamin with minerals  1 tablet Oral QPC supper  . mupirocin ointment  1 application Nasal BID  . senna  1 tablet Oral Daily   Continuous Infusions: . cefTRIAXone (ROCEPHIN)  IV Stopped (08/30/19 1140)  . diltiazem (CARDIZEM) infusion 7.5 mg/hr (08/30/19 1143)     LOS: 2 days     Cordelia Poche, MD [facility] 08/30/2019, 11:47 AM  If 7PM-7AM, please contact night-coverage [email or web address]

## 2019-08-30 NOTE — Op Note (Signed)
[facility] Cardiopulmonary Patient Name: Deborah Shields Procedure Date: 08/30/2019 MRN: [id number] Attending MD: Candee Furbish ,  Date of Birth: 1940-07-10 CSN: [id number] Age: 79 Admit Type: Outpatient Ethnicity: Not Hispanic or Latino Procedure:             Bronchoscopy Indications:           Hemoptysis Providers:             Darnelle Maffucci. Remi Haggard, RN, Cherylynn Ridges,                         Technician Referring MD:           Medicines:             Fentanyl 75 mcg IV, Lidocaine 1% applied to cords 4                         mL, Lidocaine 1% applied to nares 4 mL Complications:         No immediate complications Estimated Blood Loss:  Estimated blood loss was minimal. Procedure:      Pre-Anesthesia Assessment:      - A History and Physical has been performed. Patient meds and allergies       have been reviewed. The risks and benefits of the procedure and the       sedation options and risks were discussed with the patient. All       questions were answered and informed consent was obtained. Patient       identification and proposed procedure were verified prior to the       procedure by the physician and the nurse in the pre-procedure area.       Mental Status Examination: alert and oriented. Airway Examination:       normal oropharyngeal airway. Respiratory Examination: clear to       auscultation. CV Examination: irregularly irregular rate and rhythm. ASA       Grade Assessment: II - A patient with mild systemic disease. After       reviewing the risks and benefits, the patient was deemed in satisfactory       condition to undergo the procedure. The anesthesia plan was to use       moderate sedation / analgesia (conscious sedation). Immediately prior to       administration of medications, the patient was re-assessed for adequacy       to receive sedatives. The heart rate, respiratory rate, oxygen       saturations, blood pressure, adequacy of pulmonary  ventilation, and       response to care were monitored throughout the procedure. The physical       status of the patient was re-assessed after the procedure.      After obtaining informed consent, the bronchoscope was passed under       direct vision. Throughout the procedure, the patient's blood pressure,       pulse, and oxygen saturations were monitored continuously. the BF-H190       ([id number]) Olympus Bronchoscope was introduced through the left nostril       and advanced to the tracheobronchial tree. The procedure was       accomplished without difficulty. The patient tolerated the procedure       well. The total duration of the procedure was 25 minutes. Findings:      The nasopharynx/oropharynx appears  normal. The larynx appears normal.       The vocal cords appear normal. The subglottic space is normal. The       trachea is of normal caliber. The carina is sharp. The tracheobronchial       tree of the right lung was examined to at least the first subsegmental       level. Bronchial mucosa and anatomy in the right lung are normal; there       are no endobronchial lesions, and no secretions.      Left Lung Abnormalities: A blood clot was found in the left mainstem       bronchus and in the superior lingula segment of the left upper lobe       (B4). It was completely obstructing the airway. The clot was       successfully removed, with no significant bleeding. Granulation tissue       was found distally in the left mainstem bronchus. It is not obstructing       the airway. Guided brushings were obtained in the left mainstem bronchus       with a cytology brush and sent for cell count, bacterial culture, viral       smears & culture, and fungal & AFB analysis and cytology. Three samples       were obtained. Brushings of lung tissue were obtained. BAL was performed       in the lingula of the lung and sent for cell count, bacterial culture,       viral smears & culture, and fungal & AFB  analysis and cytology. 110 mL       of fluid were instilled. 30 mL were returned. The return was bloody.       Mucous plugs were present in the return fluid. Multiple specimens were       obtained and pooled into one specimen, which was sent for analysis.      The remainder of the tracheobronchial tree is normal. Impression:      - Hemoptysis      - The airway examination of the right lung was normal.      - A blood clot was found in the left mainstem bronchus and in the       superior lingula segment of the left upper lobe.      - Granulation tissue was found in the left mainstem bronchus.      - Brushings were obtained.      - Bronchoalveolar lavage was performed. Moderate Sedation:      Moderate (conscious) sedation was administered by the nurse and       supervised by the physician performing the procedure. The following       parameters were monitored: oxygen saturation, heart rate, blood       pressure, and response to care. Total physician intraservice time was 25       minutes. Recommendation:      - Await BAL and brushing results. Procedure Code(s):      --- Professional ---      [phone], Bronchoscopy, rigid or flexible, including fluoroscopic guidance,       when performed; with bronchial alveolar lavage      [phone], Bronchoscopy, rigid or flexible, including fluoroscopic guidance,       when performed; with brushing or protected brushings      99152, Moderate sedation services provided by the same physician or       other  qualified health care professional performing the diagnostic or       therapeutic service that the sedation supports, requiring the presence       of an independent trained observer to assist in the monitoring of the       patient's level of consciousness and physiological status; initial 15       minutes of intraservice time, patient age 51 years or older      [phone], Moderate sedation; each additional 15 minutes intraservice time Diagnosis Code(s):       --- Professional ---      R04.2, Hemoptysis CPT copyright 2019 American Medical Association. All rights reserved. The codes documented in this report are preliminary and upon coder review may  be revised to meet current compliance requirements. Candee Furbish,  08/30/2019 8:47:09 AM Number of Addenda: 0 Scope In: Scope Out:

## 2019-08-30 NOTE — Progress Notes (Signed)
Plan to transfer to SD from Endo due to Diltiazem drip. Patient's daughter updated and at bedside. Personal belongings obtained from previous inpatient room.

## 2019-08-31 ENCOUNTER — Inpatient Hospital Stay (HOSPITAL_COMMUNITY): Payer: Medicare Other

## 2019-08-31 ENCOUNTER — Encounter: Payer: Self-pay | Admitting: *Deleted

## 2019-08-31 ENCOUNTER — Other Ambulatory Visit: Payer: Self-pay | Admitting: *Deleted

## 2019-08-31 ENCOUNTER — Telehealth: Payer: Self-pay | Admitting: Pulmonary Disease

## 2019-08-31 DIAGNOSIS — I350 Nonrheumatic aortic (valve) stenosis: Secondary | ICD-10-CM

## 2019-08-31 DIAGNOSIS — I480 Paroxysmal atrial fibrillation: Secondary | ICD-10-CM

## 2019-08-31 DIAGNOSIS — I351 Nonrheumatic aortic (valve) insufficiency: Secondary | ICD-10-CM

## 2019-08-31 LAB — ECHOCARDIOGRAM COMPLETE
Height: 66 in
Weight: 1760.15 oz

## 2019-08-31 LAB — ACID FAST SMEAR (AFB, MYCOBACTERIA): Acid Fast Smear: NEGATIVE

## 2019-08-31 LAB — CYTOLOGY - NON PAP

## 2019-08-31 MED ORDER — POTASSIUM CHLORIDE CRYS ER 20 MEQ PO TBCR
40.0000 meq | EXTENDED_RELEASE_TABLET | Freq: Once | ORAL | Status: AC
  Administered 2019-08-31: 40 meq via ORAL
  Filled 2019-08-31: qty 2

## 2019-08-31 MED ORDER — AMIODARONE IV BOLUS ONLY 150 MG/100ML
150.0000 mg | Freq: Once | INTRAVENOUS | Status: AC
  Administered 2019-08-31: 150 mg via INTRAVENOUS
  Filled 2019-08-31: qty 100

## 2019-08-31 NOTE — Telephone Encounter (Signed)
Deborah Garfinkel, MD  P [facility] Triage Pool  Please make follow up with APP or Dr. Elsworth Soho in 2 weeks for post hospital follow up  ---------------------------------------------------------------------- Pt is still admitted.

## 2019-08-31 NOTE — Progress Notes (Signed)
Patient heart rhythm noted to be in A-fib with high blood pressure at 03:50 am. Elink was notified. Patient was given potassium and bolus of amiodarone. Patient heart rhythm is now back in normal sinus rhythm. Blood pressure has improved as well.

## 2019-08-31 NOTE — Progress Notes (Deleted)
SLP Cancellation Note  Patient Details Name: Aricka Goldberger MRN: [id number] DOB: Feb 27, 1941   Cancelled treatment:       Reason Eval/Treat Not Completed: Other (comment);Medical issues which prohibited therapy(pt had endoscopy, will continue efforts- bronch x2 days sequentially.)  [name], MS [facility] SLP Acute Rehab Services Office [phone]   Macario Golds 08/31/2019, 12:43 PM

## 2019-08-31 NOTE — Progress Notes (Signed)
PROGRESS NOTE    Deborah Shields  YTK:[id number] DOB: Apr 09, 1941 DOA: 08/28/2019 PCP: Lajean Manes, MD   Brief Narrative: Deborah Shields is a 79 y.o. female with medical history significant of stage III chronic kidney disease, dyslipidemia, hypertension, arthritis, and paroxysmal atrial fibrillation. Patient presented secondary to hemoptysis and found to have likely atypical pneumonia. Patient started on empiric Vancomycin, Ceftriaxone and Azithromycin. She was also found to have afib with RVR and started on a diltiazem drip which was discontinued. Patient underwent bronchoscopy on 3/8 which was significant for blood/clot and BAL performed. Patient returned to atrial fibrillation with resultant rapid ventricular response.   Assessment & Plan:   Principal Problem:   Hemoptysis Active Problems:   Hypothyroidism   HYPERLIPIDEMIA   Essential hypertension   PAF (paroxysmal atrial fibrillation) (HCC)   COPD with chronic bronchitis and emphysema (HCC)   Pneumonia   Hemoptysis Patient with previous history two years ago with blood clots found on video bronchoscopy. Concern for atypical pneumonia on current admission. No recurrent hemoptysis and hemoglobin has been stable. -Pulmonology recommendations: await BAL and brushings results. -Hold aspirin -Pulmonology recommendations: outpatient follow-up  Ground glass lung opacities Presumed atypical lung infection. Started on Ceftriaxone, Vancomycin and Azithromycin in the ED and continued on Ceftriaxone on admission. AFB sputum smear and culture ordered and are pending. Pulmonology recommending to hold antibiotics. Ceftriaxone/Azithromycin discontinued 3/8.  Paroxysmal atrial fibrillation with RVR Rapid rates in the ED secondary to patient not taking her metoprolol that day. Started on diltiazem drip on admission. Initially resolved and maintained on home metoprolol. After bronchoscopy, patient returned to atrial fibrillation with  resultant rapid ventricular response. Per discussion with daughter, concern is patient is frequently in RVR secondary to frequent episodes of fatigue/palpitations as an outpatient. Patient's family has a long history of heart disease including arrhythmia requiring pace maker. -Continue home metoprolol -Cardiology recommendations: metoprolol tartrate 25 mg BID, avoid dual AV nodal agents, considering flecainide but will manage as an outpatient with EP consult  Hypothyroidism TSH slightly low. Possibly contributing to Afib -Decreased to Synthroid 75 mcg daily  Anxiety -Continue Ativan prn  CKD stage IIIb Stable.   DVT prophylaxis: SCDs Code Status:   Code Status: Full Code Family Communication: None at bedside. Called daughter with no response Disposition Plan: Transfer to telemetry and watch overnight secondary to arrhythmia overnight. Also will need PT/OT evaluation for safe discharge.   Consultants:   Pulmonology  Cardiology  Procedures:   BRONCHOSCOPY (08/30/19) Impression:      - Hemoptysis      - The airway examination of the right lung was normal.      - A blood clot was found in the left mainstem bronchus and in the       superior lingula segment of the left upper lobe.      - Granulation tissue was found in the left mainstem bronchus.      - Brushings were obtained.      - Bronchoalveolar lavage was performed. Moderate Sedation:      Moderate (conscious) sedation was administered by the nurse and       supervised by the physician performing the procedure. The following       parameters were monitored: oxygen saturation, heart rate, blood       pressure, and response to care. Total physician intraservice time was 25       minutes. Recommendation:      - Await BAL and brushing results.  Moderate Sedation: -  Await BAL and brushing results.  Antimicrobials:  Ceftriaxone  Vancomycin  Azithromycin    Subjective: No concerns today. Overnight, patient had  episodes of RVR requiring amiodarone bolus with subsequent return to sinus rhythm  Objective: Vitals:   08/31/19 0323 08/31/19 0418 08/31/19 0420 08/31/19 0800  BP:  (!) 158/119 (!) 157/79   Pulse:  (!) 105 (!) 114   Resp:  14 14   Temp: 98.9 F (37.2 C)   98.3 F (36.8 C)  TempSrc: Axillary   Oral  SpO2:  98% 99%   Weight:      Height:        Intake/Output Summary (Last 24 hours) at 08/31/2019 0856 Last data filed at 08/30/2019 1405 Gross per 24 hour  Intake 261.86 ml  Output --  Net 261.86 ml   Filed Weights   08/28/19 1241 08/28/19 1800  Weight: 51.7 kg 49.9 kg    Examination:  General exam: Appears calm and comfortable Respiratory system: Clear to auscultation. Respiratory effort normal. Cardiovascular system: S1 & S2 heard, RRR. Systolic murmur. Gastrointestinal system: Abdomen is nondistended, soft and nontender. No organomegaly or masses felt. Normal bowel sounds heard. Central nervous system: Alert and oriented. No focal neurological deficits. Extremities: No edema. No calf tenderness Skin: No cyanosis. No rashes Psychiatry: Judgement and insight appear normal. Mood & affect appropriate.   Data Reviewed: I have personally reviewed following labs and imaging studies  CBC: Recent Labs  Lab 08/28/19 1247 08/29/19 0316 08/30/19 0553  WBC 6.5 7.8 7.0  HGB 12.3 10.8* 11.1*  HCT 38.7 34.2* 35.1*  MCV 93.3 92.7 91.6  PLT 257 220 024   Basic Metabolic Panel: Recent Labs  Lab 08/28/19 1247 08/29/19 0316 08/30/19 1246  NA 140 137 141  K 3.6 3.6 3.7  CL 104 104 107  CO2 _0 GLUCOSE 97 100* 116*  BUN 32* 32* 22  CREATININE 1.46* 1.28* 1.29*  CALCIUM 8.9 8.7* 8.7*  MG  --   --  3.1*   GFR: Estimated Creatinine Clearance: 27.4 mL/min (A) (by C-G formula based on SCr of 1.29 mg/dL (H)). Liver Function Tests: Recent Labs  Lab 08/28/19 1315  AST 27  ALT 15  ALKPHOS 90  BILITOT 1.1  PROT 7.0  ALBUMIN 3.5   No results for input(s): LIPASE,  AMYLASE in the last 168 hours. No results for input(s): AMMONIA in the last 168 hours. Coagulation Profile: Recent Labs  Lab 08/28/19 1315  INR 1.0   Cardiac Enzymes: No results for input(s): CKTOTAL, CKMB, CKMBINDEX, TROPONINI in the last 168 hours. BNP (last 3 results) No results for input(s): PROBNP in the last 8760 hours. HbA1C: No results for input(s): HGBA1C in the last 72 hours. CBG: No results for input(s): GLUCAP in the last 168 hours. Lipid Profile: No results for input(s): CHOL, HDL, LDLCALC, TRIG, CHOLHDL, LDLDIRECT in the last 72 hours. Thyroid Function Tests: Recent Labs    08/30/19 1246  TSH 0.262*   Anemia Panel: No results for input(s): VITAMINB12, FOLATE, FERRITIN, TIBC, IRON, RETICCTPCT in the last 72 hours. Sepsis Labs: No results for input(s): PROCALCITON, LATICACIDVEN in the last 168 hours.  Recent Results (from the past 240 hour(s))  SARS CORONAVIRUS 2 (TAT 6-24 HRS) Nasopharyngeal Nasopharyngeal Swab     Status: None   Collection Time: 08/28/19  3:09 PM   Specimen: Nasopharyngeal Swab  Result Value Ref Range Status   SARS Coronavirus 2 NEGATIVE NEGATIVE Final    Comment: (NOTE) SARS-CoV-2 target nucleic  acids are NOT DETECTED. The SARS-CoV-2 RNA is generally detectable in upper and lower respiratory specimens during the acute phase of infection. Negative results do not preclude SARS-CoV-2 infection, do not rule out co-infections with other pathogens, and should not be used as the sole basis for treatment or other patient management decisions. Negative results must be combined with clinical observations, patient history, and epidemiological information. The expected result is Negative. Fact Sheet for Patients: SugarRoll.be Fact Sheet for Healthcare Providers: https://www.woods-mathews.com/ This test is not yet approved or cleared by the [place] FDA and  has been authorized for detection and/or  diagnosis of SARS-CoV-2 by FDA under an Emergency Use Authorization (EUA). This EUA will remain  in effect (meaning this test can be used) for the duration of the COVID-19 declaration under Section 56 4(b)(1) of the Act, 21 U.S.C. section 360bbb-3(b)(1), unless the authorization is terminated or revoked sooner. Performed at [facility], [place] [address] [place], [place] [address]   MRSA PCR Screening     Status: Abnormal   Collection Time: 08/28/19  5:20 PM   Specimen: Nasal Mucosa; Nasopharyngeal  Result Value Ref Range Status   MRSA by PCR POSITIVE (A) NEGATIVE Final    Comment:        The GeneXpert MRSA Assay (FDA approved for NASAL specimens only), is one component of a comprehensive MRSA colonization surveillance program. It is not intended to diagnose MRSA infection nor to guide or monitor treatment for MRSA infections. RESULT CALLED TO, READ BACK BY AND VERIFIED WITH: Susann Givens RN 1911 08/28/19 JM Performed at [facility], [place] [address], [place], [place] [address]   Culture, respiratory     Status: None (Preliminary result)   Collection Time: 08/30/19  8:37 AM   Specimen: Bronchoalveolar Lavage; Respiratory  Result Value Ref Range Status   Specimen Description   Final    BRONCHIAL ALVEOLAR LAVAGE Performed at [facility] [address] [place], [place] [address]    Special Requests   Final    NONE Performed at [facility], [place] [address], [place], [place] [address]    Gram Stain NO WBC SEEN NO ORGANISMS SEEN   Final   Culture   Final    NO GROWTH < 24 HOURS Performed at [facility], [place] [address], [place], [place] [address]    Report Status PENDING  Incomplete         Radiology Studies: No results found.      Scheduled Meds: . acidophilus   Oral QPC breakfast  . azithromycin  500 mg Oral Daily  . B-complex with vitamin C  1 tablet Oral QPC supper  . Chlorhexidine  Gluconate Cloth  6 each Topical Daily  . Chlorhexidine Gluconate Cloth  6 each Topical Q0600  . chlorpheniramine-HYDROcodone  5 mL Oral Q12H  . levothyroxine  75 mcg Oral QAC breakfast  . LORazepam  0.5 mg Oral QHS  . meclizine  12.5-25 mg Oral QHS  . metoprolol tartrate  25 mg Oral BID  . multivitamin with minerals  1 tablet Oral QPC supper  . mupirocin ointment  1 application Nasal BID  . senna  1 tablet Oral Daily   Continuous Infusions:    LOS: 3 days     Cordelia Poche, MD [facility] 08/31/2019, 8:56 AM  If 7PM-7AM, please contact night-coverage [email or web address]

## 2019-08-31 NOTE — Progress Notes (Signed)
Echocardiogram 2D Echocardiogram has been performed.  Deborah Shields 08/31/2019, 8:42 AM

## 2019-08-31 NOTE — Progress Notes (Signed)
  Speech Language Pathology Treatment: Dysphagia  Patient Details Name: Deborah Shields MRN: [id number] DOB: 04/12/1941 Today's Date: 08/31/2019 Time: 1250-1308 SLP Time Calculation (min) (ACUTE ONLY): 18 min  Assessment / Plan / Recommendation Clinical Impression  Pt passed [facility] 3 ounce water test without cough post-swallow====she required 2nd trial to be successful; pt admits to dysphagia to pills - sensing them lodge at times on the right side and coughing/expectorating a few times- She reports MD scoped her and denied her having any structural deficits.  Pt also admits to dysphagia to dry foods - SLP reviewed importance of starting intake with liquid for lubrication. Pt with some difficulties with solids yesterday but also had endoscopy-bronch which may have caused temporary edema. Educated pt and daughter to recommendations and swallow precautions.  Also reviewed purpose of 3 ounce water test.     Functional swallow present overall.  If pt indicates worsening issues with swallowing, SLP would suspect cervical esophageal source and consider dedicted esophageal evaluation.  All education completed and all parties in agreement.    HPI HPI: Pt is an 79 y.o. female with medical history significant of stage III chronic kidney disease, dyslipidemia, hypertension, arthritis, and paroxysmal atrial fibrillation. Patient has been at her usual state of health until 08/28/19 when she woke up coughing with positive bright red blood hemoptysis. CT of the chest: ground-glass and centrilobular nodules in the lingula and posterior basal segment of the left lower lobe appearance of features suggestive of a chronic atypical infectious process including etiologies such as atypical mycobacterial infection/MAI. Diminished interstitial opacities in the right upper lobe and right lower lobe.  Pt underwent BSE 2 days prior and follow up indicated to assure po tolerance and determine indication for instrumental swallow  evaluation.      SLP Plan  Continue with current plan of care       Recommendations  Diet recommendations: Regular;Thin liquid Liquids provided via: Cup;Straw Medication Administration: Whole meds with liquid Supervision: Patient able to self feed Compensations: Slow rate;Small sips/bites;Follow solids with liquid Postural Changes and/or Swallow Maneuvers: Seated upright 90 degrees;Upright 30-60 min after meal                Oral Care Recommendations: Oral care BID Follow up Recommendations: None SLP Visit Diagnosis: Dysphagia, unspecified (R13.10) Plan: Continue with current plan of care       GO                Deborah Shields 08/31/2019, 3:24 PM  Deborah Lime, MS [place] [facility] [phone]

## 2019-08-31 NOTE — Progress Notes (Signed)
eLink Physician-Brief Progress Note Patient Name: Deborah Shields DOB: 1940-09-06 MRN: [id number]   Date of Service  08/31/2019  HPI/Events of Note  AFIB with RVR - Ventriclar rate = 130. K+ = 3.7, Mg++ = 3.1 and Creatinine = 1.29.   eICU Interventions  Will order: 1. Replace K+.  2. Amiodarone 150 mg IV over 10 minutes now.      Intervention Category Major Interventions: Arrhythmia - evaluation and management  [name] 08/31/2019, 3:53 AM

## 2019-08-31 NOTE — Progress Notes (Addendum)
NAME:  Deborah Shields, MRN:  [id number], DOB:  [birth year], LOS: 3 ADMISSION DATE:  08/28/2019, CONSULTATION DATE:  08/29/19 REFERRING MD:  Lonny Prude, CHIEF COMPLAINT:  Hemoptysis   Brief History   79 year old woman w/ hx PAD p/w hemoptysis.  History of present illness   79 year old woman w/ hx PAD p/w hemoptysis.  Fair amount estimate ~100cc's yesterday.  None this AM.  Had this issue previously, originated from lingula, cultures grew MRSA at that time.  Sputum sent for culture this time.  PCCM consulted for potential bronchoscopy.  Patient does have some unintentional weight loss in 2017 that was attributed to tickborne illness, this has since stabilized.  On baby aspirin at home.  No recent sick contacts.  No infectious symptoms.  Admits to trouble swallowing that GI has evaluated her for and neg workup including EGD.  Past Medical History  PAD Prior PE Arthritis  Significant Hospital Events   08/28/19 admitted 3/8 bronchoscopy which showed clot in lingula and LUL bronchus with granulation tissue in lingula  Consults:  PCCM  Procedures:  N/A  Significant Diagnostic Tests:  CTA chest- lingular infiltrate Echo 3/9 >   Micro Data:  Sputum 3/8 >  BAL 3/8 > AFB 3/8 >   Antimicrobials:  Received vanc/ceftriaxone/azithromycin in ER   Interim history/subjective:  Much improved this AM.  No further hemoptysis.  Objective   Blood pressure (!) 157/79, pulse (!) 114, temperature 98.9 F (37.2 C), temperature source Axillary, resp. rate 14, height _0  (1.676 m), weight 49.9 kg, SpO2 99 %.        Intake/Output Summary (Last 24 hours) at 08/31/2019 0849 Last data filed at 08/30/2019 1405 Gross per 24 hour  Intake 261.86 ml  Output --  Net 261.86 ml   Filed Weights   08/28/19 1241 08/28/19 1800  Weight: 51.7 kg 49.9 kg    Examination: General: Elderly female, in NAD HENT: MM dry, trachea midline Lungs: Clear, no wheezing Cardiovascular: IRIR, +murmur, ext warm Abdomen:  Soft, +BS Extremities: Advanced arthritic changes Neuro: Moves all 4 ext to command Psych: AOx3, good insight   Assessment & Plan:  Hemoptysis, abnormal CT- something is probably colonized in her lingula and causing issues.  AFB neg in 2019.  Had MRSA growing there before. - F/u on BAL / brushings - Monitor and record amount of hemoptysis - Will need OP follow up in our office, will arrange this for sometime within the next 2 weeks.   Dysphagia - should have SLP see to complete workup: she denied dysphagia to SLP, normal diet recommended  Afib- new (maybe, daughter and her thinks she may have had this diagnosis under control), need to get hemoptysis resolved prior to any [facility] challenge.  CHADS2vasc score 5, should be on NoAC.  Giving 21m IV metoprolol, replete K/Mg and continue PTA PO metoprolol.    RMontey Hora PMarsingPulmonary & Critical Care Medicine 08/31/2019, 9:08 AM   Attending note: I have seen and examined the patient. History, labs and imaging reviewed.  79year old with paroxysmal atrial fibrillation, hemoptysis status post bronchoscopy No more episodes of sleep.  No events overnight.  Blood pressure (!) 157/79, pulse (!) 114, temperature 98.3 F (36.8 C), temperature source Oral, resp. rate 14, height _1  (1.676 m), weight 49.9 kg, SpO2 99 %. Gen:      No acute distress HEENT:  EOMI, sclera anicteric Neck:     No masses; no thyromegaly Lungs:  Clear to auscultation bilaterally; normal respiratory effort CV:         Regular rate and rhythm; no murmurs Abd:      + bowel sounds; soft, non-tender; no palpable masses, no distension Ext:    No edema; adequate peripheral perfusion Skin:      Warm and dry; no rash Neuro: alert and oriented x 3 Psych: normal mood and affect   Labs/Imaging personally reviewed, significant for Hemoglobin 11.1, platelets 220 No new imaging  Assessment/plan: Hemoptysis probably from granulation tissue in the left mainstem She has  a similar presentation in 2019 Follow BAL cultures to rule out chronic infection such as MAI, follow bronchial brushing Follow-up in clinic  Marshell Garfinkel MD [facility] 08/31/2019, 9:34 AM

## 2019-08-31 NOTE — Progress Notes (Signed)
    Subjective:  Denies SSCP, palpitations or Dyspnea Getting echo at bedside now   Objective:  Vitals:   08/31/19 0025 08/31/19 0323 08/31/19 0418 08/31/19 0420  BP: (!) 169/93  (!) 158/119 (!) 157/79  Pulse: 78  (!) 105 (!) 114  Resp: _0 Temp:  98.9 F (37.2 C)    TempSrc:  Axillary    SpO2: 100%  98% 99%  Weight:      Height:        Intake/Output from previous day:  Intake/Output Summary (Last 24 hours) at 08/31/2019 1610 Last data filed at 08/30/2019 1405 Gross per 24 hour  Intake 261.86 ml  Output --  Net 261.86 ml    Physical Exam: Affect appropriate Frail elderly female  HEENT: normal Neck supple with no adenopathy JVP normal no bruits no thyromegaly Lungs clear with no wheezing and good diaphragmatic motion Heart:  S1/S2 SEM  murmur, no rub, gallop or click PMI normal Abdomen: benighn, BS positve, no tenderness, no AAA no bruit.  No HSM or HJR Distal pulses intact with no bruits No edema Neuro non-focal Skin warm and dry No muscular weakness   Lab Results: Basic Metabolic Panel: Recent Labs    08/29/19 0316 08/30/19 1246  NA 137 141  K 3.6 3.7  CL 104 107  CO2 24 28  GLUCOSE 100* 116*  BUN 32* 22  CREATININE 1.28* 1.29*  CALCIUM 8.7* 8.7*  MG  --  3.1*   Liver Function Tests: Recent Labs    08/28/19 1315  AST 27  ALT 15  ALKPHOS 90  BILITOT 1.1  PROT 7.0  ALBUMIN 3.5   No results for input(s): LIPASE, AMYLASE in the last 72 hours. CBC: Recent Labs    08/29/19 0316 08/30/19 0553  WBC 7.8 7.0  HGB 10.8* 11.1*  HCT 34.2* 35.1*  MCV 92.7 91.6  PLT 220 228    Recent Labs    08/30/19 1246  TSH 0.262*    Imaging: No results found.  Cardiac Studies:  ECG: SR rate 69 QT normal bi atrial enlargement    Telemetry: NSR 08/31/2019   Echo: pending   Medications:   . acidophilus   Oral QPC breakfast  . azithromycin  500 mg Oral Daily  . B-complex with vitamin C  1 tablet Oral QPC supper  . Chlorhexidine Gluconate  Cloth  6 each Topical Daily  . Chlorhexidine Gluconate Cloth  6 each Topical Q0600  . chlorpheniramine-HYDROcodone  5 mL Oral Q12H  . levothyroxine  75 mcg Oral QAC breakfast  . LORazepam  0.5 mg Oral QHS  . meclizine  12.5-25 mg Oral QHS  . metoprolol tartrate  25 mg Oral BID  . multivitamin with minerals  1 tablet Oral QPC supper  . mupirocin ointment  1 application Nasal BID  . senna  1 tablet Oral Daily      Assessment/Plan:   1. PAF:  NSR currently not a candidate for anticoagulation due to hemoptysis Consider flecainide if recurs. Beta blocker changed to lopressor 25 bid short acting in light of post termination pauses. Avoid dual AV nodal blocking agents   2. Hemoptysis:  Post bronch improved Hct stable BAL washings/cultures pending Recurrent last episode 1.5 years ago ? MAI Plan per pulmonary   3. Thyroid:  TSH normal continue replacement   Jenkins Rouge 08/31/2019, 8:37 AM

## 2019-09-01 DIAGNOSIS — E782 Mixed hyperlipidemia: Secondary | ICD-10-CM

## 2019-09-01 LAB — CBC
HCT: 36 % (ref 36.0–46.0)
Hemoglobin: 11.4 g/dL — ABNORMAL LOW (ref 12.0–15.0)
MCH: 29.5 pg (ref 26.0–34.0)
MCHC: 31.7 g/dL (ref 30.0–36.0)
MCV: 93.3 fL (ref 80.0–100.0)
Platelets: 235 10*3/uL (ref 150–400)
RBC: 3.86 MIL/uL — ABNORMAL LOW (ref 3.87–5.11)
RDW: 13.6 % (ref 11.5–15.5)
WBC: 7.9 10*3/uL (ref 4.0–10.5)
nRBC: 0 % (ref 0.0–0.2)

## 2019-09-01 LAB — ACID FAST SMEAR (AFB, MYCOBACTERIA): Acid Fast Smear: NEGATIVE

## 2019-09-01 LAB — CULTURE, RESPIRATORY W GRAM STAIN
Culture: NO GROWTH
Gram Stain: NONE SEEN

## 2019-09-01 LAB — BASIC METABOLIC PANEL
Anion gap: 8 (ref 5–15)
BUN: 17 mg/dL (ref 8–23)
CO2: 24 mmol/L (ref 22–32)
Calcium: 8.6 mg/dL — ABNORMAL LOW (ref 8.9–10.3)
Chloride: 106 mmol/L (ref 98–111)
Creatinine, Ser: 1.25 mg/dL — ABNORMAL HIGH (ref 0.44–1.00)
GFR calc Af Amer: 47 mL/min — ABNORMAL LOW (ref >60)
GFR calc non Af Amer: 41 mL/min — ABNORMAL LOW (ref >60)
Glucose, Bld: 111 mg/dL — ABNORMAL HIGH (ref 70–99)
Potassium: 3.8 mmol/L (ref 3.5–5.1)
Sodium: 138 mmol/L (ref 135–145)

## 2019-09-01 LAB — MAGNESIUM: Magnesium: 2.3 mg/dL (ref 1.7–2.4)

## 2019-09-01 MED ORDER — AMIODARONE IV BOLUS ONLY 150 MG/100ML
150.0000 mg | Freq: Once | INTRAVENOUS | Status: DC
  Filled 2019-09-01: qty 100

## 2019-09-01 MED ORDER — AMIODARONE HCL IN DEXTROSE 360-4.14 MG/200ML-% IV SOLN
60.0000 mg/h | INTRAVENOUS | Status: DC
  Filled 2019-09-01: qty 200

## 2019-09-01 MED ORDER — FLECAINIDE ACETATE 50 MG PO TABS
50.0000 mg | ORAL_TABLET | Freq: Two times a day (BID) | ORAL | Status: DC
  Administered 2019-09-01 – 2019-09-07 (×12): 50 mg via ORAL
  Filled 2019-09-01 (×14): qty 1

## 2019-09-01 MED ORDER — AMIODARONE HCL IN DEXTROSE 360-4.14 MG/200ML-% IV SOLN: 30.0000 mg/h | INTRAVENOUS | Status: DC

## 2019-09-01 MED ORDER — LABETALOL HCL 5 MG/ML IV SOLN
5.0000 mg | INTRAVENOUS | Status: AC | PRN
  Administered 2019-09-01 (×2): 5 mg via INTRAVENOUS
  Filled 2019-09-01 (×2): qty 4

## 2019-09-01 MED ORDER — DIGOXIN 0.25 MG/ML IJ SOLN
0.2500 mg | Freq: Every day | INTRAMUSCULAR | Status: AC
  Administered 2019-09-01: 0.25 mg via INTRAVENOUS
  Filled 2019-09-01: qty 1

## 2019-09-01 MED ORDER — METOPROLOL TARTRATE 5 MG/5ML IV SOLN
5.0000 mg | INTRAVENOUS | Status: AC | PRN
  Administered 2019-09-01 (×2): 5 mg via INTRAVENOUS
  Filled 2019-09-01 (×2): qty 5

## 2019-09-01 MED ORDER — DIGOXIN 0.25 MG/ML IJ SOLN: 0.2500 mg | Freq: Every day | INTRAMUSCULAR | Status: DC

## 2019-09-01 NOTE — Evaluation (Signed)
Occupational Therapy Evaluation Patient Details Name: Deborah Shields MRN: [id number] DOB: 11-Sep-1940 Today's Date: 09/01/2019    History of Present Illness Deborah Shields is a 79 y.o. female with medical history significant of stage III chronic kidney disease, dyslipidemia, hypertension, arthritis, and paroxysmal atrial fibrillation, DVT. Patient presented secondary to hemoptysis and found to have likely atypical pneumonia. She was also found to have afib with RVR .Patient underwent bronchoscopy on 3/8 which was significant for blood/clot and BAL performed. Patient returned to atrial fibrillation with resultant rapid ventricular response.   Clinical Impression   Pt admitted with the above diagnosis.   Pt currently with functional limitations due to the deficits listed below (see OT Problem List).  Pt will benefit from skilled OT to increase their safety and independence with ADL and functional mobility for ADL to facilitate discharge to venue listed below.   Pts family will A upon DC     Follow Up Recommendations  Supervision/Assistance - 24 hour    Equipment Recommendations  None recommended by OT    Recommendations for Other Services       Precautions / Restrictions Precautions Precautions: Fall Precaution Comments: She has significant heart history- including paroxysmal A Fib. Restrictions Weight Bearing Restrictions: No      Mobility Bed Mobility Overal bed mobility: Needs Assistance Bed Mobility: Supine to Sit     Supine to sit: Min guard        Transfers Overall transfer level: Needs assistance Equipment used: 1 person hand held assist Transfers: Sit to/from [facility] Sit to Stand: Min assist Stand pivot transfers: Min assist            Balance Overall balance assessment: Needs assistance Sitting-balance support: No upper extremity supported Sitting balance-Leahy Scale: Good     Standing balance support: Single extremity  supported Standing balance-Leahy Scale: Fair                             ADL either performed or assessed with clinical judgement   ADL Overall ADL's : Needs assistance/impaired     Grooming: Wash/dry hands;Oral care;Minimal assistance;Standing   Upper Body Bathing: Set up;Sitting   Lower Body Bathing: Minimal assistance;Sit to/from stand;Cueing for sequencing;Cueing for safety   Upper Body Dressing : Set up;Sitting   Lower Body Dressing: Minimal assistance;Cueing for safety;Cueing for sequencing;Sit to/from stand   Toilet Transfer: Min guard;Cueing for sequencing;Cueing for Designer, television/film set and Hygiene: Min guard;Sit to/from stand   Tub/ Banker: Cueing for safety;Cueing for sequencing     General ADL Comments: daughter will provide 24/7 A at home as needed     Vision Patient Visual Report: No change from baseline       Perception     Praxis      Pertinent Vitals/Pain Pain Assessment: No/denies pain     Hand Dominance Right   Extremity/Trunk Assessment Upper Extremity Assessment Upper Extremity Assessment: Generalized weakness   Lower Extremity Assessment Lower Extremity Assessment: Generalized weakness   Cervical / Trunk Assessment Cervical / Trunk Assessment: Normal   Communication Communication Communication: No difficulties   Cognition Arousal/Alertness: Awake/alert Behavior During Therapy: WFL for tasks assessed/performed Overall Cognitive Status: Within Functional Limits for tasks assessed                                     General  Comments  Monitor for overall balance, and she does have paroxysmal A Fib    Exercises General Exercises - Lower Extremity Ankle Circles/Pumps: AROM;Seated;Both Quad Sets: AROM;Seated;Both Gluteal Sets: AROM;Seated;Both Short Arc Quad: AROM;Seated;Both Long Arc Quad: Both Heel Slides: AROM;Seated;Both Hip ABduction/ADduction: AROM;Seated;Both Other  Exercises Other Exercises: HEP in printed form issued following good return demo. Daughter present during this instruction.   Shoulder Instructions      Home Living Family/patient expects to be discharged to:: Private residence Living Arrangements: Alone;Other (Comment) Available Help at Discharge: Family Type of Home: House Home Access: Stairs to enter [employer] of Steps: 2 from garage and back, and another entry 4, all with handrails Entrance Stairs-Rails: Can reach both;Left;Right Home Layout: One level     Bathroom Shower/Tub: Occupational psychologist: [place] - single point;Walker - 2 wheels;Shower seat          Prior Functioning/Environment Level of Independence: Independent                 OT Problem List: Decreased activity tolerance;Decreased strength      OT Treatment/Interventions: Self-care/ADL training;Therapeutic activities    OT Goals(Current goals can be found in the care plan section) Acute Rehab OT Goals Patient Stated Goal: Just want to feel better and get stronger, go home OT Goal Formulation: With patient Time For Goal Achievement: 09/08/19 Potential to Achieve Goals: Good  OT Frequency: Min 2X/week   Barriers to D/C:            Co-evaluation              AM-PAC OT "6 Clicks" Daily Activity     Outcome Measure Help from another person eating meals?: None Help from another person taking care of personal grooming?: A Little Help from another person toileting, which includes using toliet, bedpan, or urinal?: A Little Help from another person bathing (including washing, rinsing, drying)?: A Little Help from another person to put on and taking off regular upper body clothing?: None Help from another person to put on and taking off regular lower body clothing?: A Little 6 Click Score: 20   End of Session Equipment Utilized During Treatment: Gait belt Nurse Communication: Mobility  status  Activity Tolerance: Patient tolerated treatment well Patient left: in chair;with call bell/phone within reach;with family/visitor present  OT Visit Diagnosis: Unsteadiness on feet (R26.81);Other abnormalities of gait and mobility (R26.89);Muscle weakness (generalized) (M62.81);History of falling (Z91.81)                Time: 7793-9030 OT Time Calculation (min): 13 min Charges:  OT General Charges $OT Visit: 1 Visit OT Evaluation $OT Eval Moderate Complexity: 1 Mod  Kari Baars, [employer] [phone] Office- [phone], Edwena Felty D 09/01/2019, 3:19 PM

## 2019-09-01 NOTE — Progress Notes (Signed)
   NAME:  Deborah Shields, MRN:  [id number], DOB:  24-Nov-1940, LOS: 4 ADMISSION DATE:  08/28/2019, CONSULTATION DATE:  08/29/19 REFERRING MD:  Lonny Prude, CHIEF COMPLAINT:  Hemoptysis   Brief History   79 year old woman w/ hx PAD p/w hemoptysis.  History of present illness   79 year old woman w/ hx PAD p/w hemoptysis.  Fair amount estimate ~100cc's yesterday.  None this AM.  Had this issue previously, originated from lingula, cultures grew MRSA at that time.  Sputum sent for culture this time.  PCCM consulted for potential bronchoscopy.  Patient does have some unintentional weight loss in 2017 that was attributed to tickborne illness, this has since stabilized.  On baby aspirin at home.  No recent sick contacts.  No infectious symptoms.  Admits to trouble swallowing that GI has evaluated her for and neg workup including EGD.  Past Medical History  PAD Prior PE Arthritis  Significant Hospital Events   08/28/19 admitted 3/8 bronchoscopy which showed clot in lingula and LUL bronchus with granulation tissue in lingula  Consults:  PCCM  Procedures:  N/A  Significant Diagnostic Tests:  CTA chest 3/6- lingular infiltrate Echo 3/9 > LVEF 55-60%, RVSP is normal.  No change from prior.  Micro Data:  Sputum 3/8 >  BAL 3/8 > AFB 3/8 >   Antimicrobials:  Received vanc/ceftriaxone/azithromycin in ER   Interim history/subjective:  No further episodes of hemoptysis Respiratory status is stable Back into A. fib overnight.  Objective   Blood pressure 122/69, pulse 75, temperature 98.6 F (37 C), temperature source Oral, resp. rate 14, height _0  (1.676 m), weight 49.9 kg, SpO2 94 %.        Intake/Output Summary (Last 24 hours) at 09/01/2019 1626 Last data filed at 09/01/2019 0600 Gross per 24 hour  Intake -  Output 500 ml  Net -500 ml   Filed Weights   08/28/19 1241 08/28/19 1800  Weight: 51.7 kg 49.9 kg    Examination: Blood pressure 122/69, pulse 75, temperature 98.6 F (37  C), temperature source Oral, resp. rate 14, height _1  (1.676 m), weight 49.9 kg, SpO2 94 %. Gen:      No acute distress HEENT:  EOMI, sclera anicteric Neck:     No masses; no thyromegaly Lungs:    Clear to auscultation bilaterally; normal respiratory effort CV:         Regular rate and rhythm; no murmurs Abd:      + bowel sounds; soft, non-tender; no palpable masses, no distension Ext:    No edema; adequate peripheral perfusion Skin:      Warm and dry; no rash Neuro: alert and oriented x 3 Psych: normal mood and affect  Assessment & Plan:  Hemoptysis, abnormal CT- something is probably colonized in her lingula and causing issues.  AFB neg in 2019.  Had MRSA growing there before. - Brushings showed benign changes.  Follow final BAL cultures - Will need OP follow up in our office, will arrange this for sometime within the next 2 weeks.  Discussed with patient and her daughter  Paroxysmal Afib Continue Lopressor.  Starting flecainide per cardiology.   Marshell Garfinkel MD [facility] Please see [email or web address] for pager details.  09/01/2019, 4:32 PM

## 2019-09-01 NOTE — Evaluation (Signed)
Physical Therapy Evaluation Patient Details Name: Deborah Shields MRN: [id number] DOB: 1940/07/26 Today's Date: 09/01/2019   History of Present Illness  Deborah Shields is a 79 y.o. female with medical history significant of stage III chronic kidney disease, dyslipidemia, hypertension, arthritis, and paroxysmal atrial fibrillation, DVT. Patient presented secondary to hemoptysis and found to have likely atypical pneumonia. She was also found to have afib with RVR .Patient underwent bronchoscopy on 3/8 which was significant for blood/clot and BAL performed. Patient returned to atrial fibrillation with resultant rapid ventricular response.  Clinical Impression  Patient resting in bed when PT arrived. RN cleared for her to be treated. Daughter arrived halfway through assessment. Patient indicates she lives alone in one level home, with 3 entries, and having 2 steps, 2 steps, and 4 steps all with handrails. She has walk in shower with a seat. Standard and elevated commode seat (has both). She reports that she is independent in all ADLs including driving- and daughter confirms this. Presents with generalized weakness. Has SPC, and rollator, but "does not use them all the time". Instructed in HEP with good return demo, and printed sheet issued. Should benefit from skilled PT to further address goals for optimal functional outcomes.    Follow Up Recommendations Home health PT    Equipment Recommendations  (has all necessary equipment.)    Recommendations for Other Services       Precautions / Restrictions Precautions Precautions: Fall Precaution Comments: She has significant heart history- including paroxysmal A Fib. Restrictions Weight Bearing Restrictions: No      Mobility  Bed Mobility Overal bed mobility: Needs Assistance Bed Mobility: Supine to Sit     Supine to sit: Min guard        Transfers Overall transfer level: Needs assistance Equipment used: 1 person hand held  assist Transfers: Sit to/from [facility] Sit to Stand: Min assist Stand pivot transfers: Min assist          Ambulation/Gait   Gait Distance (Feet): 38 Feet Assistive device: 1 person hand held assist Gait Pattern/deviations: Narrow base of support;Shuffle   Gait velocity interpretation: 1.31 - 2.62 ft/sec, indicative of limited community Conservation officer, historic buildings [place] (Stroke Patients Only)       Balance Overall balance assessment: Needs assistance Sitting-balance support: No upper extremity supported Sitting balance-Leahy Scale: Good     Standing balance support: Single extremity supported Standing balance-Leahy Scale: Fair                               Pertinent Vitals/Pain Pain Assessment: No/denies pain    Home Living Family/patient expects to be discharged to:: Private residence Living Arrangements: Alone;Other (Comment) Available Help at Discharge: Family Type of Home: House Home Access: Stairs to enter Entrance Stairs-Rails: Can reach both;Left;Right Entrance Stairs-Number of Steps: 2 from garage and back, and another entry 4, all with handrails Home Layout: One level Home Equipment: [place] - single point;Walker - 2 wheels;Shower seat      Prior Function Level of Independence: Independent               Hand Dominance   Dominant Hand: Right    Extremity/Trunk Assessment        Lower Extremity Assessment Lower Extremity Assessment: Generalized weakness    Cervical / Trunk Assessment Cervical / Trunk Assessment: Normal  Communication   Communication: No difficulties  Cognition Arousal/Alertness: Awake/alert Behavior During Therapy: WFL for tasks assessed/performed Overall Cognitive Status: Within Functional Limits for tasks assessed                                        General Comments General comments (skin integrity, edema, etc.):  Monitor for overall balance, and she does have paroxysmal A Fib    Exercises General Exercises - Lower Extremity Ankle Circles/Pumps: AROM;Seated;Both Quad Sets: AROM;Seated;Both Gluteal Sets: AROM;Seated;Both Short Arc Quad: AROM;Seated;Both Long Arc Quad: Both Heel Slides: AROM;Seated;Both Hip ABduction/ADduction: AROM;Seated;Both Other Exercises Other Exercises: HEP in printed form issued following good return demo. Daughter present during this instruction.   Assessment/Plan    PT Assessment Patient needs continued PT services  PT Problem List Decreased strength;Decreased activity tolerance       PT Treatment Interventions Gait training;Patient/family education;Therapeutic activities;Therapeutic exercise    PT Goals (Current goals can be found in the Care Plan section)  Acute Rehab PT Goals Patient Stated Goal: Just want to feel better and get stronger, go home PT Goal Formulation: With patient Time For Goal Achievement: 09/15/19 Potential to Achieve Goals: Good    Frequency Min 3X/week   Barriers to discharge        Co-evaluation               AM-PAC PT "6 Clicks" Mobility  Outcome Measure Help needed turning from your back to your side while in a flat bed without using bedrails?: A Little Help needed moving from lying on your back to sitting on the side of a flat bed without using bedrails?: A Little Help needed moving to and from a bed to a chair (including a wheelchair)?: A Little Help needed standing up from a chair using your arms (e.g., wheelchair or bedside chair)?: A Little Help needed to walk in hospital room?: A Little Help needed climbing 3-5 steps with a railing? : A Little 6 Click Score: 18    End of Session   Activity Tolerance: Patient tolerated treatment well Patient left: in bed Nurse Communication: Mobility status PT Visit Diagnosis: Muscle weakness (generalized) (M62.81)    Time: 1103-1594 PT Time Calculation (min) (ACUTE ONLY): 32  min   Charges:   PT Evaluation $PT Eval Moderate Complexity: 1 Mod PT Treatments $Therapeutic Exercise: 8-22 mins       Rollen Sox, PT # [phone] CGV cell  Casandra Doffing 09/01/2019, 3:04 PM

## 2019-09-01 NOTE — Progress Notes (Addendum)
   09/01/19 0025  Vitals  BP (!) 161/90  MAP (mmHg) 106  BP Method Automatic  Pulse Rate (!) 140  Pulse Rate Source Monitor  Resp 18  Oxygen Therapy  SpO2 94 %  MEWS Score  MEWS Temp 0  MEWS Systolic 0  MEWS Pulse 3  MEWS RR 0  MEWS LOC 0  MEWS Score 3  MEWS Score Color Yellow  MEWS Assessment  Is this an acute change? Yes  MEWS guidelines implemented *See Row Information* Yellow  Provider Notification  Provider Name/Title Kasandra Knudsen  Date Provider Notified 09/01/19  Time Provider Notified [phone]  Notification Type Page  Notification Reason Change in status  Response See new orders  Date of Provider Response 09/01/19  Time of Provider Response 0035 (incorrect information)

## 2019-09-01 NOTE — Progress Notes (Addendum)
Progress Note  Patient Name: Deborah Shields Date of Encounter: 09/01/2019  Primary Cardiologist: Sanda Klein, MD   Subjective   Patient was in atrial fibrillation with rates as high as the 140's to 160's for post of the night. She does report some palpitations with this as well as possible lightheadedness. No chest pain or shortness of breath.  Inpatient Medications    Scheduled Meds:  acidophilus   Oral QPC breakfast   B-complex with vitamin C  1 tablet Oral QPC supper   Chlorhexidine Gluconate Cloth  6 each Topical Daily   Chlorhexidine Gluconate Cloth  6 each Topical Q0600   chlorpheniramine-HYDROcodone  5 mL Oral Q12H   levothyroxine  75 mcg Oral QAC breakfast   LORazepam  0.5 mg Oral QHS   meclizine  12.5-25 mg Oral QHS   metoprolol tartrate  25 mg Oral BID   multivitamin with minerals  1 tablet Oral QPC supper   mupirocin ointment  1 application Nasal BID   senna  1 tablet Oral Daily   Continuous Infusions:   PRN Meds: acetaminophen **OR** acetaminophen, albuterol, cyclobenzaprine, guaiFENesin-dextromethorphan, ondansetron **OR** ondansetron (ZOFRAN) IV, phenol, traMADol   Vital Signs    Vitals:   09/01/19 0820 09/01/19 0831 09/01/19 0838 09/01/19 0851  BP: 117/87 117/87 121/74 121/81  Pulse: (!) 129 (!) 140 80 80  Resp:      Temp:      TempSrc:      SpO2: 96% 96% 95% 94%  Weight:      Height:        Intake/Output Summary (Last 24 hours) at 09/01/2019 1003 Last data filed at 09/01/2019 0600 Gross per 24 hour  Intake --  Output 500 ml  Net -500 ml   Last 3 Weights 08/28/2019 08/28/2019 02/23/2019  Weight (lbs) 110 lb 0.2 oz 114 lb 114 lb  Weight (kg) 49.9 kg 51.71 kg 51.71 kg      Telemetry    Went into atrial fibrillation shortly after midnight and remained in that for most the night. She would briefly converted back to sinus rhythm and then return to atrial fibrillation. Post-conversion pauses noted - longest one being about 2.9 seconds. She has  been in normal sinus rhythm since around 8am with rates in the 70's. - Personally Reviewed  ECG    No new ECG tracing today. - Personally Reviewed  Physical Exam   GEN: No acute distress.   Neck: Supple. Cardiac: RRR. II-III/VI systolic murmur. No gallops or rubs. Respiratory: No increased work of breathing. Clear to auscultation bilaterally. GI: Soft, non-distended, and non-tender. Bowel sounds present. MS: No edema. No deformity. Skin: Warm and dry. Neuro:  No focal deficits. Psych: Normal affect. Responds appropriately.   Labs    High Sensitivity Troponin:   Recent Labs  Lab 08/28/19 1247 08/28/19 1447  TROPONINIHS 18* 17      Chemistry Recent Labs  Lab 08/28/19 1247 08/28/19 1315 08/29/19 0316 08/30/19 1246 09/01/19 0616  NA   < >  --  137 141 138  K   < >  --  3.6 3.7 3.8  CL   < >  --  104 107 106  CO2   < >  --  _0 GLUCOSE   < >  --  100* 116* 111*  BUN   < >  --  32* 22 17  CREATININE   < >  --  1.28* 1.29* 1.25*  CALCIUM   < >  --  8.7* 8.7* 8.6*  PROT  --  7.0  --   --   --   ALBUMIN  --  3.5  --   --   --   AST  --  27  --   --   --   ALT  --  15  --   --   --   ALKPHOS  --  90  --   --   --   BILITOT  --  1.1  --   --   --   GFRNONAA   < >  --  39* 39* 41*  GFRAA   < >  --  46* 45* 47*  ANIONGAP   < >  --  _0 < > = values in this interval not displayed.     Hematology Recent Labs  Lab 08/29/19 0316 08/30/19 0553 09/01/19 0616  WBC 7.8 7.0 7.9  RBC 3.69* 3.83* 3.86*  HGB 10.8* 11.1* 11.4*  HCT 34.2* 35.1* 36.0  MCV 92.7 91.6 93.3  MCH 29.3 29.0 29.5  MCHC 31.6 31.6 31.7  RDW 13.4 13.6 13.6  PLT 220 228 235    BNPNo results for input(s): BNP, PROBNP in the last 168 hours.   DDimer No results for input(s): DDIMER in the last 168 hours.   Radiology    ECHOCARDIOGRAM COMPLETE  Result Date: 08/31/2019    ECHOCARDIOGRAM REPORT   Patient Name:   Deborah Shields Date of Exam: 08/31/2019 Medical Rec #:  [id number]             Height:       66.0 in Accession #:    [id number]           Weight:       110.0 lb Date of Birth:  12/08/1940            BSA:          1.551 m Patient Age:    79 years             BP:           149/75 mmHg Patient Gender: F                    HR:           74 bpm. Exam Location:  Inpatient Procedure: 2D Echo, Color Doppler and Cardiac Doppler Indications:    I48.91* Unspecified atrial fibrillation  History:        Patient has prior history of Echocardiogram examinations, most                 recent 12/23/2016. COPD, Arrythmias:Atrial Fibrillation; Risk                 Factors:Hypertension and Dyslipidemia.  Sonographer:    Raquel Sarna Senior RDCS Referring Phys: [id number] [place]  1. Left ventricular ejection fraction, by estimation, is 55 to 60%. The left ventricle has normal function. The left ventricle has no regional wall motion abnormalities. Left ventricular diastolic parameters were normal.  2. Right ventricular systolic function is normal. The right ventricular size is normal. There is normal pulmonary artery systolic pressure. The estimated right ventricular systolic pressure is 57.3 mmHg.  3. The mitral valve is normal in structure. No evidence of mitral valve regurgitation.  4. The aortic valve is tricuspid. Aortic valve regurgitation is mild to moderate. Mild aortic valve stenosis. Aortic valve area, by VTI measures 1.42 cm.  Aortic valve mean gradient measures 11.0 mmHg. Aortic valve Vmax measures 2.27 m/s.  5. The inferior vena cava is normal in size with greater than 50% respiratory variability, suggesting right atrial pressure of 3 mmHg. Comparison(s): No significant change from prior study. Prior images reviewed side by side. FINDINGS  Left Ventricle: Left ventricular ejection fraction, by estimation, is 55 to 60%. The left ventricle has normal function. The left ventricle has no regional wall motion abnormalities. The left ventricular internal cavity size was normal in size. There is   no left ventricular hypertrophy. Left ventricular diastolic parameters were normal. Indeterminate filling pressures. Right Ventricle: The right ventricular size is normal. No increase in right ventricular wall thickness. Right ventricular systolic function is normal. There is normal pulmonary artery systolic pressure. The tricuspid regurgitant velocity is 2.29 m/s, and  with an assumed right atrial pressure of 3 mmHg, the estimated right ventricular systolic pressure is 99.8 mmHg. Left Atrium: Left atrial size was normal in size. Right Atrium: Right atrial size was normal in size. Pericardium: There is no evidence of pericardial effusion. Mitral Valve: The mitral valve is normal in structure. No evidence of mitral valve regurgitation. Tricuspid Valve: The tricuspid valve is normal in structure. Tricuspid valve regurgitation is trivial. Aortic Valve: The aortic valve is tricuspid. . There is moderate thickening and mild calcification of the aortic valve. Aortic valve regurgitation is mild to moderate. Aortic regurgitation PHT measures 532 msec. Mild aortic stenosis is present. There is moderate thickening of the aortic valve. There is mild calcification of the aortic valve. Aortic valve mean gradient measures 11.0 mmHg. Aortic valve peak gradient measures 20.6 mmHg. Aortic valve area, by VTI measures 1.42 cm. Pulmonic Valve: The pulmonic valve was not well visualized. Pulmonic valve regurgitation is not visualized. Aorta: The aortic root and ascending aorta are structurally normal, with no evidence of dilitation. Venous: The inferior vena cava is normal in size with greater than 50% respiratory variability, suggesting right atrial pressure of 3 mmHg. IAS/Shunts: No atrial level shunt detected by color flow Doppler.  LEFT VENTRICLE PLAX 2D LVIDd:         3.70 cm  Diastology LVIDs:         2.50 cm  LV e' lateral:   7.72 cm/s LV PW:         0.90 cm  LV E/e' lateral: 9.2 LV IVS:        0.80 cm  LV e' medial:    5.55  cm/s LVOT diam:     1.98 cm  LV E/e' medial:  12.7 LV SV:         62 LV SV Index:   40 LVOT Area:     3.08 cm  RIGHT VENTRICLE RV S prime:     10.80 cm/s TAPSE (M-mode): 1.8 cm LEFT ATRIUM             Index       RIGHT ATRIUM           Index LA diam:        2.50 cm 1.61 cm/m  RA Area:     11.40 cm LA Vol (A2C):   43.9 ml 28.30 ml/m RA Volume:   25.20 ml  16.25 ml/m LA Vol (A4C):   29.9 ml 19.28 ml/m LA Biplane Vol: 37.7 ml 24.30 ml/m  AORTIC VALVE AV Area (Vmax):    1.41 cm AV Area (Vmean):   1.33 cm AV Area (VTI):     1.42 cm AV  Vmax:           227.00 cm/s AV Vmean:          152.000 cm/s AV VTI:            0.439 m AV Peak Grad:      20.6 mmHg AV Mean Grad:      11.0 mmHg LVOT Vmax:         104.00 cm/s LVOT Vmean:        65.900 cm/s LVOT VTI:          0.202 m LVOT/AV VTI ratio: 0.46 AI PHT:            532 msec  AORTA Ao Root diam: 2.60 cm Ao Asc diam:  2.90 cm MITRAL VALVE               TRICUSPID VALVE MV Area (PHT): 3.19 cm    TR Peak grad:   21.0 mmHg MV Decel Time: 238 msec    TR Vmax:        229.00 cm/s MV E velocity: 70.70 cm/s MV A velocity: 66.00 cm/s  SHUNTS MV E/A ratio:  1.07        Systemic VTI:  0.20 m                            Systemic Diam: 1.98 cm Sanda Klein MD Electronically signed by Sanda Klein MD Signature Date/Time: 08/31/2019/12:01:09 PM    Final     Cardiac Studies   Echocardiogram 09/01/2019: Impressions: 1. Left ventricular ejection fraction, by estimation, is 55 to 60%. The  left ventricle has normal function. The left ventricle has no regional  wall motion abnormalities. Left ventricular diastolic parameters were  normal.   2. Right ventricular systolic function is normal. The right ventricular  size is normal. There is normal pulmonary artery systolic pressure. The  estimated right ventricular systolic pressure is 14.4 mmHg.   3. The mitral valve is normal in structure. No evidence of mitral valve  regurgitation.   4. The aortic valve is tricuspid. Aortic  valve regurgitation is mild to  moderate. Mild aortic valve stenosis. Aortic valve area, by VTI measures  1.42 cm. Aortic valve mean gradient measures 11.0 mmHg. Aortic valve Vmax  measures 2.27 m/s.   5. The inferior vena cava is normal in size with greater than 50%  respiratory variability, suggesting right atrial pressure of 3 mmHg.  Patient Profile   Ms. Meints is a 79 y.o. female with a history of atrial fibrillation in 2012 after shoulder surgency not on anticoagulation due to no recurrence, bilateral carotid artery disease secondary to fibromuscular dysplasia, hypertension, hyperlipidemia, hypothyroidism, and CKD stage III  who is being seen today for the evaluation of atrial fibrillation with RVR at the request of Dr. Lonny Prude.  Assessment & Plan    Atrial Fibrillation with RVR - Patient presented with hemoptysis and was found to be in atrial fibrillation. Converted with IV Cardizem. Long post-conversion pauses noted (about 8 seconds)s.  History of post-op atrial fibrillation in 2012 following shoulder surgery but this was an isolated event so patient not on any anticoagulation.  - Patient went back into atrial fibrillation with rates as high as the 140's to 160's around midnight last night and remained in atrial fibrillation most the night. She would briefly convert to sinus rhythm and then return to atrial fibrillation. She continued to have post-conversion pauses (longest this time 2.9 seconds). She  was started on IV Amiodarone but heart rates dropped to the 30's so this was discontinued.  - Echo this admission showed LVEF of 55-60% with normal wall motion and diastolic function. Mild to moderate aortic regurgitation and mild aortic stenosis noted.  - TSH slightly low at 0.262. Will defer to primary team.  - Magnesium elevated at 3.1 on 08/30/2019. Will recheck today. - Potassium 3.8 today. - Continue Lopressor 73m twice daily.  - Discussed with Dr. NJohnsie Cancel Will start Flecainide 575m twice daily. No coronary calcifications were noted on CT. Will need repeat EKG in 1 week.  - CHA2DS2-VASc = 5 ( HTN, prior PE, age x2, female). Recommend DOAC after hemoptysis has been fully evaluated and resolved. Restart when OK with Pulmonology.  - Will continue to monitor on telemetry while here. Will place order for live Zio monitor to be placed prior to discharge and call and notify EKG team.    Carotid Artery Disease - Most recent carotid ultrasounds in 12/2018 showed 1-39% stenosis of bilateral ICAs. Elevated velocity in distal left ICA suggestive of fibromuscular dysplasia.  - Aspirin currently on hold due to hemoptysis.  - Intolerant to statins. - Followed by Vascular Surgery.    Hypertension - BP elevated at 137/115.  - Continue Lopressor as above.   CKD Stage III - Creatinine 1.28 yesterday. Baseline appears to be around 1.2 to 1.4. BUN elevated at 32.  - Continue to monitor closely.    Hemoptysis - Patient underwent bronchoscopy today which showed blood clot int he left mainstem bronchus and in the superior lingula segment of the left upper lobe as well as granulation tissue in the left mainstem bronchus. Brushing were obtained. Final results pending. - Management per primary team.   For questions or updates, please contact [facility] consult [email or web address] for contact info under        Signed, CaDarreld McleanPA-C  09/01/2019, 10:03 AM    Patient continues to have bouts of PAF.  Will start flecainide 50 bid. She and daughter are frustrated by lack of answers regarding etiology of her hemoptysis And not having results from BAL yet. We can arrange Zio AT at d/c and will hold anticoagulation indefinitely until pulmonary have a better handle on bronchial  Pathology   PeJenkins RougeD FANew England Eye Surgical [facility] Inc

## 2019-09-01 NOTE — Progress Notes (Signed)
RN paged NP regarding order for amiodarone drip.  Pt was started on amiodarone last night with drop in heart rate to 30bpm after bolus.  Discontinued.

## 2019-09-01 NOTE — Care Management Important Message (Signed)
Important Message  Patient Details IM Letter given to Evette Cristal SW Case Manager to present to the Patient Name: Deborah Shields MRN: [id number] Date of Birth: 1940-08-20   Medicare Important Message Given:  Yes     Kerin Salen 09/01/2019, 12:30 PM

## 2019-09-01 NOTE — Progress Notes (Signed)
   Update: I called EKG department and was notified that placement of live Zio monitors prior to discharge is only able to be done at [facility]. Therefore, will arrange for outpatient Zio through our office instead.  Darreld Mclean, PA-C 09/01/2019 11:13 AM

## 2019-09-01 NOTE — Progress Notes (Addendum)
PROGRESS NOTE  Deborah Shields NGE:[id number] DOB: July 30, 1940 DOA: 08/28/2019 PCP: Lajean Manes, MD   LOS: 4 days   Brief narrative: As per HPI,  Deborah Shields is a 79 y.o. femalewith medical history significant ofstage III chronic kidney disease, dyslipidemia, hypertension, arthritis, and paroxysmal atrial fibrillation. Patient presented secondary to hemoptysis and found to have likely atypical pneumonia. Patient started on empiric Vancomycin, Ceftriaxone and Azithromycin. She was also found to have afib with RVR and started on a diltiazem drip which was discontinued. Patient underwent bronchoscopy on 3/8 which was significant for blood/clot and BAL performed. Patient returned to atrial fibrillation with resultant rapid ventricular response.  Assessment/Plan:  Principal Problem:   Hemoptysis Active Problems:   Hypothyroidism   HYPERLIPIDEMIA   Essential hypertension   PAF (paroxysmal atrial fibrillation) (HCC)   COPD with chronic bronchitis and emphysema (HCC)   Pneumonia   Hemoptysis with groundglass opacities. Possibility of atypical pneumonia on this admission. Patient underwent bronchoscopy with benign bronchoalveolar lavage findings.  Hold aspirin.  Pulmonary recommend outpatient follow-up.  Fungal culture negative so far.  AFB pending.  Initially patient received Rocephin Zithromax.  Discontinued antibiotics 3/ 8..  Paroxysmal atrial fibrillation with RVR Was on hold at home.  Was started on Cardizem during hospitalization and resumed metoprolol.  Patient had rapid ventricle response after bronchoscopy.  Cardiology recommended adjusting dose of metoprolol 25 twice daily and has considered flecainide.  2D echocardiogram showed left ventricular ejection fraction of 55 to 60% with normal wall motion.  TSH slightly low at 0.262.  Will check T3-T4.  Continue to replenish lites as necessary.  Does haveCHA2DS2-VASc = 5 ( HTN, prior PE, age x2, female) so will need  anticoagulation when okay with pulmonary.  Patient will need Zio monitor on discharge.  Hypothyroidism TSH slightly low.  Dose of Synthroid was decreased to 75 mcg daily  Essential hypertension.  Continue metoprolol.  Anxiety -Continue Ativan prn  CKD stage IIIb Stable.  Closely monitor BMP.  VTE Prophylaxis: SCD  Code Status: Full code  Family Communication: Spoke with the patient's daughter at bedside.  Disposition Plan:  . Patient is from home . Likely disposition to home with home health likely by tomorrow . Barriers to discharge: Atrial fibrillation with RVR on flecainide, cardiology recommendations.   Consultants:  Pulmonary  Cardiology  Procedures:  Bronchoscopy on 08/30/2019  Antibiotics:  . None  Anti-infectives (From admission, onward)   Start     Dose/Rate Route Frequency Ordered Stop   08/30/19 1600  azithromycin (ZITHROMAX) tablet 500 mg     500 mg Oral Daily 08/30/19 1450 08/31/19 1025   08/28/19 1800  cefTRIAXone (ROCEPHIN) 1 g in sodium chloride 0.9 % 100 mL IVPB  Status:  Discontinued     1 g 200 mL/hr over 30 Minutes Intravenous Daily 08/28/19 1728 08/30/19 1454   08/28/19 1800  azithromycin (ZITHROMAX) tablet 500 mg     500 mg Oral Daily 08/28/19 1728 08/28/19 1751   08/28/19 1615  vancomycin (VANCOCIN) IVPB 1000 mg/200 mL premix     1,000 mg 200 mL/hr over 60 Minutes Intravenous  Once 08/28/19 1601 08/28/19 1850   08/28/19 1615  cefTRIAXone (ROCEPHIN) 1 g in sodium chloride 0.9 % 100 mL IVPB  Status:  Discontinued     1 g 200 mL/hr over 30 Minutes Intravenous  Once 08/28/19 1601 08/28/19 1735     Subjective: Today, patient was seen and examined at bedside.  Patient denies any chest pain,  shortness of breath  or cough today.  Had atrial fibrillation with RVR in the nighttime with mild palpitation..  Objective: Vitals:   09/01/19 1453 09/01/19 1457  BP:  122/69  Pulse:  75  Resp:  14  Temp:  98.6 F (37 C)  SpO2: (!) 5% 94%     Intake/Output Summary (Last 24 hours) at 09/01/2019 1510 Last data filed at 09/01/2019 0600 Gross per 24 hour  Intake --  Output 500 ml  Net -500 ml   Filed Weights   08/28/19 1241 08/28/19 1800  Weight: 51.7 kg 49.9 kg   Body mass index is 17.76 kg/m.   Physical Exam: GENERAL: Patient is alert awake and oriented. Not in obvious distress. HENT: No scleral pallor or icterus. Pupils equally reactive to light. Oral mucosa is moist NECK: is supple, no gross swelling noted. CHEST: Clear to auscultation. No crackles or wheezes.  Diminished breath sounds bilaterally. CVS: S1 and S2 heard, systolic murmur noted.. Regular rate and rhythm.  ABDOMEN: Soft, non-tender, bowel sounds are present. EXTREMITIES: No edema. CNS: Cranial nerves are intact. No focal motor deficits. SKIN: warm and dry without rashes.  Data Review: I have personally reviewed the following laboratory data and studies,  CBC: Recent Labs  Lab 08/28/19 1247 08/29/19 0316 08/30/19 0553 09/01/19 0616  WBC 6.5 7.8 7.0 7.9  HGB 12.3 10.8* 11.1* 11.4*  HCT 38.7 34.2* 35.1* 36.0  MCV 93.3 92.7 91.6 93.3  PLT 257 220 228 568   Basic Metabolic Panel: Recent Labs  Lab 08/28/19 1247 08/29/19 0316 08/30/19 1246 09/01/19 0616  NA 140 137 141 138  K 3.6 3.6 3.7 3.8  CL 104 104 107 106  CO2 _0 GLUCOSE 97 100* 116* 111*  BUN 32* 32* 22 17  CREATININE 1.46* 1.28* 1.29* 1.25*  CALCIUM 8.9 8.7* 8.7* 8.6*  MG  --   --  3.1* 2.3   Liver Function Tests: Recent Labs  Lab 08/28/19 1315  AST 27  ALT 15  ALKPHOS 90  BILITOT 1.1  PROT 7.0  ALBUMIN 3.5   No results for input(s): LIPASE, AMYLASE in the last 168 hours. No results for input(s): AMMONIA in the last 168 hours. Cardiac Enzymes: No results for input(s): CKTOTAL, CKMB, CKMBINDEX, TROPONINI in the last 168 hours. BNP (last 3 results) No results for input(s): BNP in the last 8760 hours.  ProBNP (last 3 results) No results for input(s):  PROBNP in the last 8760 hours.  CBG: No results for input(s): GLUCAP in the last 168 hours. Recent Results (from the past 240 hour(s))  SARS CORONAVIRUS 2 (TAT 6-24 HRS) Nasopharyngeal Nasopharyngeal Swab     Status: None   Collection Time: 08/28/19  3:09 PM   Specimen: Nasopharyngeal Swab  Result Value Ref Range Status   SARS Coronavirus 2 NEGATIVE NEGATIVE Final    Comment: (NOTE) SARS-CoV-2 target nucleic acids are NOT DETECTED. The SARS-CoV-2 RNA is generally detectable in upper and lower respiratory specimens during the acute phase of infection. Negative results do not preclude SARS-CoV-2 infection, do not rule out co-infections with other pathogens, and should not be used as the sole basis for treatment or other patient management decisions. Negative results must be combined with clinical observations, patient history, and epidemiological information. The expected result is Negative. Fact Sheet for Patients: SugarRoll.be Fact Sheet for Healthcare Providers: https://www.woods-mathews.com/ This test is not yet approved or cleared by the [place] FDA and  has been authorized for detection and/or diagnosis of SARS-CoV-2 by FDA  under an Emergency Use Authorization (EUA). This EUA will remain  in effect (meaning this test can be used) for the duration of the COVID-19 declaration under Section 56 4(b)(1) of the Act, 21 U.S.C. section 360bbb-3(b)(1), unless the authorization is terminated or revoked sooner. Performed at [facility], [place] [address] [place], [place] [address]   MRSA PCR Screening     Status: Abnormal   Collection Time: 08/28/19  5:20 PM   Specimen: Nasal Mucosa; Nasopharyngeal  Result Value Ref Range Status   MRSA by PCR POSITIVE (A) NEGATIVE Final    Comment:        The GeneXpert MRSA Assay (FDA approved for NASAL specimens only), is one component of a comprehensive MRSA colonization surveillance program.  It is not intended to diagnose MRSA infection nor to guide or monitor treatment for MRSA infections. RESULT CALLED TO, READ BACK BY AND VERIFIED WITH: Susann Givens RN 1911 08/28/19 JM Performed at [facility], [place] [address], [place], [place] [address]   Culture, respiratory     Status: None   Collection Time: 08/30/19  8:37 AM   Specimen: Bronchoalveolar Lavage; Respiratory  Result Value Ref Range Status   Specimen Description   Final    BRONCHIAL ALVEOLAR LAVAGE Performed at [place] [address], [place], [place] [address]    Special Requests   Final    NONE Performed at [facility], [place] [address] [place], [place] [address]    Gram Stain NO WBC SEEN NO ORGANISMS SEEN   Final   Culture   Final    NO GROWTH 2 DAYS Performed at [facility], [place] [address], [place], [place] [address]    Report Status 09/01/2019 FINAL  Final  Acid Fast Smear (AFB)     Status: None   Collection Time: 08/30/19  8:37 AM   Specimen: Sputum  Result Value Ref Range Status   AFB Specimen Processing Concentration  Final   Acid Fast Smear Negative  Final    Comment: (NOTE) Performed At: [facility] [place], [place] [id number] Rush Farmer MD [id number]    Source (AFB) BRONCHIAL ALVEOLAR LAVAGE  Final    Comment: Performed at [facility], [place] [address] [place], [place] [address]  Fungus Culture With Stain     Status: None (Preliminary result)   Collection Time: 08/30/19  8:37 AM   Specimen: Sputum  Result Value Ref Range Status   Fungus Stain Final report  Final    Comment: (NOTE) Performed At: [facility] [place], [place] [id number] Rush Farmer MD KZ:[id number]    Fungus (Mycology) Culture PENDING  Incomplete   Fungal Source BRONCHIAL ALVEOLAR LAVAGE  Final    Comment: Performed at [facility], [place] [address],  [place],  [address]  Fungus Culture Result     Status: None   Collection Time: 08/30/19  8:37 AM  Result Value Ref Range Status   Result 1 Comment  Final    Comment: (NOTE) KOH/Calcofluor preparation:  no fungus observed. Performed At: [facility] [place], [place] [id number] Rush Farmer MD JS:[id number]      Studies: ECHOCARDIOGRAM COMPLETE  Result Date: 08/31/2019    ECHOCARDIOGRAM REPORT   Patient Name:   MIIA BLANKS Date of Exam: 08/31/2019 Medical Rec #:  [id number]            Height:  66.0 in Accession #:    [id number]           Weight:       110.0 lb Date of Birth:  11/24/1940            BSA:          1.551 m Patient Age:    63 years             BP:           149/75 mmHg Patient Gender: F                    HR:           74 bpm. Exam Location:  Inpatient Procedure: 2D Echo, Color Doppler and Cardiac Doppler Indications:    I48.91* Unspecified atrial fibrillation  History:        Patient has prior history of Echocardiogram examinations, most                 recent 12/23/2016. COPD, Arrythmias:Atrial Fibrillation; Risk                 Factors:Hypertension and Dyslipidemia.  Sonographer:    Raquel Sarna Senior RDCS Referring Phys: [id number] [place]  1. Left ventricular ejection fraction, by estimation, is 55 to 60%. The left ventricle has normal function. The left ventricle has no regional wall motion abnormalities. Left ventricular diastolic parameters were normal.  2. Right ventricular systolic function is normal. The right ventricular size is normal. There is normal pulmonary artery systolic pressure. The estimated right ventricular systolic pressure is 85.9 mmHg.  3. The mitral valve is normal in structure. No evidence of mitral valve regurgitation.  4. The aortic valve is tricuspid. Aortic valve regurgitation is mild to moderate. Mild aortic valve stenosis. Aortic valve area, by VTI measures 1.42 cm. Aortic valve mean gradient measures 11.0  mmHg. Aortic valve Vmax measures 2.27 m/s.  5. The inferior vena cava is normal in size with greater than 50% respiratory variability, suggesting right atrial pressure of 3 mmHg. Comparison(s): No significant change from prior study. Prior images reviewed side by side. FINDINGS  Left Ventricle: Left ventricular ejection fraction, by estimation, is 55 to 60%. The left ventricle has normal function. The left ventricle has no regional wall motion abnormalities. The left ventricular internal cavity size was normal in size. There is  no left ventricular hypertrophy. Left ventricular diastolic parameters were normal. Indeterminate filling pressures. Right Ventricle: The right ventricular size is normal. No increase in right ventricular wall thickness. Right ventricular systolic function is normal. There is normal pulmonary artery systolic pressure. The tricuspid regurgitant velocity is 2.29 m/s, and  with an assumed right atrial pressure of 3 mmHg, the estimated right ventricular systolic pressure is 29.2 mmHg. Left Atrium: Left atrial size was normal in size. Right Atrium: Right atrial size was normal in size. Pericardium: There is no evidence of pericardial effusion. Mitral Valve: The mitral valve is normal in structure. No evidence of mitral valve regurgitation. Tricuspid Valve: The tricuspid valve is normal in structure. Tricuspid valve regurgitation is trivial. Aortic Valve: The aortic valve is tricuspid. . There is moderate thickening and mild calcification of the aortic valve. Aortic valve regurgitation is mild to moderate. Aortic regurgitation PHT measures 532 msec. Mild aortic stenosis is present. There is moderate thickening of the aortic valve. There is mild calcification of the aortic valve. Aortic valve mean gradient measures 11.0 mmHg. Aortic valve peak gradient measures 20.6  mmHg. Aortic valve area, by VTI measures 1.42 cm. Pulmonic Valve: The pulmonic valve was not well visualized. Pulmonic valve  regurgitation is not visualized. Aorta: The aortic root and ascending aorta are structurally normal, with no evidence of dilitation. Venous: The inferior vena cava is normal in size with greater than 50% respiratory variability, suggesting right atrial pressure of 3 mmHg. IAS/Shunts: No atrial level shunt detected by color flow Doppler.  LEFT VENTRICLE PLAX 2D LVIDd:         3.70 cm  Diastology LVIDs:         2.50 cm  LV e' lateral:   7.72 cm/s LV PW:         0.90 cm  LV E/e' lateral: 9.2 LV IVS:        0.80 cm  LV e' medial:    5.55 cm/s LVOT diam:     1.98 cm  LV E/e' medial:  12.7 LV SV:         62 LV SV Index:   40 LVOT Area:     3.08 cm  RIGHT VENTRICLE RV S prime:     10.80 cm/s TAPSE (M-mode): 1.8 cm LEFT ATRIUM             Index       RIGHT ATRIUM           Index LA diam:        2.50 cm 1.61 cm/m  RA Area:     11.40 cm LA Vol (A2C):   43.9 ml 28.30 ml/m RA Volume:   25.20 ml  16.25 ml/m LA Vol (A4C):   29.9 ml 19.28 ml/m LA Biplane Vol: 37.7 ml 24.30 ml/m  AORTIC VALVE AV Area (Vmax):    1.41 cm AV Area (Vmean):   1.33 cm AV Area (VTI):     1.42 cm AV Vmax:           227.00 cm/s AV Vmean:          152.000 cm/s AV VTI:            0.439 m AV Peak Grad:      20.6 mmHg AV Mean Grad:      11.0 mmHg LVOT Vmax:         104.00 cm/s LVOT Vmean:        65.900 cm/s LVOT VTI:          0.202 m LVOT/AV VTI ratio: 0.46 AI PHT:            532 msec  AORTA Ao Root diam: 2.60 cm Ao Asc diam:  2.90 cm MITRAL VALVE               TRICUSPID VALVE MV Area (PHT): 3.19 cm    TR Peak grad:   21.0 mmHg MV Decel Time: 238 msec    TR Vmax:        229.00 cm/s MV E velocity: 70.70 cm/s MV A velocity: 66.00 cm/s  SHUNTS MV E/A ratio:  1.07        Systemic VTI:  0.20 m                            Systemic Diam: 1.98 cm Dani Gobble Croitoru MD Electronically signed by Sanda Klein MD Signature Date/Time: 08/31/2019/12:01:09 PM    Final       Flora Lipps, MD  [facility] 09/01/2019

## 2019-09-01 NOTE — Progress Notes (Signed)
   09/01/19 0025  Vitals  BP (!) 161/90  MAP (mmHg) 106  BP Method Automatic  Pulse Rate (!) 140  Pulse Rate Source Monitor  Resp 18  Oxygen Therapy  SpO2 94 %  MEWS Score  MEWS Temp 0  MEWS Systolic 0  MEWS Pulse 3  MEWS RR 0  MEWS LOC 0  MEWS Score 3  MEWS Score Color Yellow  MEWS Assessment  Is this an acute change? Yes  MEWS guidelines implemented *See Row Information* Yellow  Provider Notification  Provider Name/Title Kasandra Knudsen  Date Provider Notified 09/01/19  Time Provider Notified [phone]  Notification Type Page  Notification Reason Change in status  Response See new orders  Date of Provider Response 09/01/19  Time of Provider Response 0035 (incorrect information)

## 2019-09-02 ENCOUNTER — Ambulatory Visit: Payer: Medicare Other

## 2019-09-02 LAB — CBC
HCT: 33 % — ABNORMAL LOW (ref 36.0–46.0)
Hemoglobin: 10.5 g/dL — ABNORMAL LOW (ref 12.0–15.0)
MCH: 29.6 pg (ref 26.0–34.0)
MCHC: 31.8 g/dL (ref 30.0–36.0)
MCV: 93 fL (ref 80.0–100.0)
Platelets: 238 10*3/uL (ref 150–400)
RBC: 3.55 MIL/uL — ABNORMAL LOW (ref 3.87–5.11)
RDW: 13.4 % (ref 11.5–15.5)
WBC: 8 10*3/uL (ref 4.0–10.5)
nRBC: 0 % (ref 0.0–0.2)

## 2019-09-02 LAB — BASIC METABOLIC PANEL
Anion gap: 7 (ref 5–15)
BUN: 20 mg/dL (ref 8–23)
CO2: 26 mmol/L (ref 22–32)
Calcium: 8.7 mg/dL — ABNORMAL LOW (ref 8.9–10.3)
Chloride: 103 mmol/L (ref 98–111)
Creatinine, Ser: 1.43 mg/dL — ABNORMAL HIGH (ref 0.44–1.00)
GFR calc Af Amer: 40 mL/min — ABNORMAL LOW (ref >60)
GFR calc non Af Amer: 35 mL/min — ABNORMAL LOW (ref >60)
Glucose, Bld: 102 mg/dL — ABNORMAL HIGH (ref 70–99)
Potassium: 4 mmol/L (ref 3.5–5.1)
Sodium: 136 mmol/L (ref 135–145)

## 2019-09-02 LAB — MAGNESIUM: Magnesium: 2.4 mg/dL (ref 1.7–2.4)

## 2019-09-02 NOTE — Progress Notes (Addendum)
PROGRESS NOTE  Desmond Tufano ZCH:[id number] DOB: 1941-01-03 DOA: 08/28/2019 PCP: Lajean Manes, MD   LOS: 5 days   Brief narrative: As per HPI,  Noralee Dutko is a 79 y.o. femalewith medical history significant ofstage III chronic kidney disease, dyslipidemia, hypertension, arthritis, and paroxysmal atrial fibrillation. Patient presented secondary to hemoptysis and found to have likely atypical pneumonia. Patient started on empiric Vancomycin, Ceftriaxone and Azithromycin. She was also found to have afib with RVR and started on a diltiazem drip which was discontinued. Patient underwent bronchoscopy on 3/8 which was significant for blood/clot and BAL performed. Patient returned to atrial fibrillation with resultant rapid ventricular response.  Assessment/Plan:  Principal Problem:   Hemoptysis Active Problems:   Hypothyroidism   HYPERLIPIDEMIA   Essential hypertension   PAF (paroxysmal atrial fibrillation) (HCC)   COPD with chronic bronchitis and emphysema (HCC)   Pneumonia   Hemoptysis with groundglass opacities. Possibility of atypical pneumonia on this admission. Patient underwent bronchoscopy with benign bronchoalveolar lavage findings.  Continue to hold aspirin.  Pulmonary recommend outpatient follow-up.  Fungal culture negative so far.  AFB pending.  Initially patient received Rocephin Zithromax.  Discontinued antibiotics 3/ 8 as per pulmonary recommendation.  She did have 1 episode of hemoptysis today and the family was extremely concerned about it.  Spoke with pulmonary and pulmonary has spoken with the patient's her daughter.  Patient should follow-up with Dr. Tamala Julian with pulmonary as outpatient in 2 to 3 weeks.  Paroxysmal atrial fibrillation with RVR   after bronchoscopy.  Cardiology recommended adjusting dose of metoprolol 25 twice daily and patient has been started on flecainide.  2D echocardiogram showed left ventricular ejection fraction of 55 to 60% with  normal wall motion.  TSH slightly low at 0.262.    Continue to replenish lites as necessary.  Does haveCHA2DS2-VASc = 5 ( HTN, prior PE, age x2, female) so will need anticoagulation when okay with pulmonary.  Cardiology recommends against anticoagulation until at least 2 to 3 weeks and to be addressed as outpatient.  Patient will need 2 weeks Zio monitor on discharge.  Hypothyroidism TSH slightly low.  Dose of Synthroid was decreased to 75 mcg daily  Essential hypertension.  Continue metoprolol.  Reasonably controlled blood pressure.  Anxiety -Continue Ativan prn  CKD stage IIIb Stable.  Closely monitor BMP.  VTE Prophylaxis: SCD  Code Status: Full code  Family Communication: Spoke with the patient's daughter on the phone and updated her about the clinical condition of the patient.  She is very concerned about hemoptysis and I have explained to her that it was likely old blood.  Disposition Plan:  . Patient is from home . Likely disposition to home with home health likely by tomorrow if no further hemostasis.  PT has recommended home health PT on discharge . Barriers to discharge: Episode of hemoptysis.  Family is extremely concerned about this.  Face-to-face done with home health PT OT and RN.   Consultants:  Pulmonary  Cardiology  Procedures:  Bronchoscopy on 08/30/2019  Antibiotics:  . None  Anti-infectives (From admission, onward)   Start     Dose/Rate Route Frequency Ordered Stop   08/30/19 1600  azithromycin (ZITHROMAX) tablet 500 mg     500 mg Oral Daily 08/30/19 1450 08/31/19 1025   08/28/19 1800  cefTRIAXone (ROCEPHIN) 1 g in sodium chloride 0.9 % 100 mL IVPB  Status:  Discontinued     1 g 200 mL/hr over 30 Minutes Intravenous Daily 08/28/19 1728 08/30/19 1454  08/28/19 1800  azithromycin (ZITHROMAX) tablet 500 mg     500 mg Oral Daily 08/28/19 1728 08/28/19 1751   08/28/19 1615  vancomycin (VANCOCIN) IVPB 1000 mg/200 mL premix     1,000 mg 200 mL/hr over  60 Minutes Intravenous  Once 08/28/19 1601 08/28/19 1850   08/28/19 1615  cefTRIAXone (ROCEPHIN) 1 g in sodium chloride 0.9 % 100 mL IVPB  Status:  Discontinued     1 g 200 mL/hr over 30 Minutes Intravenous  Once 08/28/19 1601 08/28/19 1735     Subjective: Today, patient was seen and examined at bedside.  Denies chest pain palpitation but had one episode of dark hemoptysis in the afternoon.  Objective: Vitals:   09/02/19 0840 09/02/19 1327  BP:  (!) 145/76  Pulse:  65  Resp: 13 14  Temp:  98 F (36.7 C)  SpO2:  97%    Intake/Output Summary (Last 24 hours) at 09/02/2019 1516 Last data filed at 09/02/2019 0600 Gross per 24 hour  Intake 240 ml  Output --  Net 240 ml   Filed Weights   08/28/19 1241 08/28/19 1800  Weight: 51.7 kg 49.9 kg   Body mass index is 17.76 kg/m.   Physical Exam: GENERAL: Patient is alert awake and oriented. Not in obvious distress.  Thinly built HENT: No scleral pallor or icterus. Pupils equally reactive to light. Oral mucosa is moist NECK: is supple, no gross swelling noted. CHEST: Clear to auscultation. No crackles or wheezes.  Diminished breath sounds bilaterally. CVS: S1 and S2 heard, systolic murmur noted.. Regular rate and rhythm.  ABDOMEN: Soft, non-tender, bowel sounds are present. EXTREMITIES: No edema. CNS: Cranial nerves are intact. No focal motor deficits. SKIN: warm and dry without rashes.  Data Review: I have personally reviewed the following laboratory data and studies,  CBC: Recent Labs  Lab 08/28/19 1247 08/29/19 0316 08/30/19 0553 09/01/19 0616 09/02/19 0458  WBC 6.5 7.8 7.0 7.9 8.0  HGB 12.3 10.8* 11.1* 11.4* 10.5*  HCT 38.7 34.2* 35.1* 36.0 33.0*  MCV 93.3 92.7 91.6 93.3 93.0  PLT 257 220 228 235 791   Basic Metabolic Panel: Recent Labs  Lab 08/28/19 1247 08/29/19 0316 08/30/19 1246 09/01/19 0616 09/02/19 0458  NA 140 137 141 138 136  K 3.6 3.6 3.7 3.8 4.0  CL 104 104 107 106 103  CO2 _0 GLUCOSE 97 100* 116* 111* 102*  BUN 32* 32* _1 CREATININE 1.46* 1.28* 1.29* 1.25* 1.43*  CALCIUM 8.9 8.7* 8.7* 8.6* 8.7*  MG  --   --  3.1* 2.3 2.4   Liver Function Tests: Recent Labs  Lab 08/28/19 1315  AST 27  ALT 15  ALKPHOS 90  BILITOT 1.1  PROT 7.0  ALBUMIN 3.5   No results for input(s): LIPASE, AMYLASE in the last 168 hours. No results for input(s): AMMONIA in the last 168 hours. Cardiac Enzymes: No results for input(s): CKTOTAL, CKMB, CKMBINDEX, TROPONINI in the last 168 hours. BNP (last 3 results) No results for input(s): BNP in the last 8760 hours.  ProBNP (last 3 results) No results for input(s): PROBNP in the last 8760 hours.  CBG: No results for input(s): GLUCAP in the last 168 hours. Recent Results (from the past 240 hour(s))  SARS CORONAVIRUS 2 (TAT 6-24 HRS) Nasopharyngeal Nasopharyngeal Swab     Status: None   Collection Time: 08/28/19  3:09 PM   Specimen: Nasopharyngeal Swab  Result Value Ref Range Status  SARS Coronavirus 2 NEGATIVE NEGATIVE Final    Comment: (NOTE) SARS-CoV-2 target nucleic acids are NOT DETECTED. The SARS-CoV-2 RNA is generally detectable in upper and lower respiratory specimens during the acute phase of infection. Negative results do not preclude SARS-CoV-2 infection, do not rule out co-infections with other pathogens, and should not be used as the sole basis for treatment or other patient management decisions. Negative results must be combined with clinical observations, patient history, and epidemiological information. The expected result is Negative. Fact Sheet for Patients: SugarRoll.be Fact Sheet for Healthcare Providers: https://www.woods-mathews.com/ This test is not yet approved or cleared by the [place] FDA and  has been authorized for detection and/or diagnosis of SARS-CoV-2 by FDA under an Emergency Use Authorization (EUA). This EUA will remain  in effect  (meaning this test can be used) for the duration of the COVID-19 declaration under Section 56 4(b)(1) of the Act, 21 U.S.C. section 360bbb-3(b)(1), unless the authorization is terminated or revoked sooner. Performed at [facility], [place] [address] [place], [place] [address]   MRSA PCR Screening     Status: Abnormal   Collection Time: 08/28/19  5:20 PM   Specimen: Nasal Mucosa; Nasopharyngeal  Result Value Ref Range Status   MRSA by PCR POSITIVE (A) NEGATIVE Final    Comment:        The GeneXpert MRSA Assay (FDA approved for NASAL specimens only), is one component of a comprehensive MRSA colonization surveillance program. It is not intended to diagnose MRSA infection nor to guide or monitor treatment for MRSA infections. RESULT CALLED TO, READ BACK BY AND VERIFIED WITH: Susann Givens RN 1911 08/28/19 JM Performed at [facility], [place] [address], [place], [place] [address]   Acid Fast Smear (AFB)     Status: None   Collection Time: 08/28/19  5:28 PM   Specimen: Sputum  Result Value Ref Range Status   AFB Specimen Processing Concentration  Final   Acid Fast Smear Negative  Final    Comment: (NOTE) Performed At: [facility] [place], [place] [id number] Rush Farmer MD BE:[id number]    Source (AFB) SPU  Final    Comment: Performed at [facility], [place] [address] [place], [place] [address]  Culture, respiratory     Status: None   Collection Time: 08/30/19  8:37 AM   Specimen: Bronchoalveolar Lavage; Respiratory  Result Value Ref Range Status   Specimen Description   Final    BRONCHIAL ALVEOLAR LAVAGE Performed at [place] [address] [place], [place] [address]    Special Requests   Final    NONE Performed at [facility], [place] [address], [place], [place] [address]    Gram Stain NO WBC SEEN NO ORGANISMS SEEN   Final   Culture   Final    NO GROWTH 2  DAYS Performed at [facility], [place] [address] [place], [place] [address]    Report Status 09/01/2019 FINAL  Final  Acid Fast Smear (AFB)     Status: None   Collection Time: 08/30/19  8:37 AM   Specimen: Sputum  Result Value Ref Range Status   AFB Specimen Processing Concentration  Final   Acid Fast Smear Negative  Final    Comment: (NOTE) Performed At: [facility] [address] [place], [place] [id number] Rush Farmer MD KG:[id number]    Source (AFB) BRONCHIAL ALVEOLAR LAVAGE  Final    Comment: Performed at [facility]  [place] [address] [place], [place] [address]  Fungus Culture With Stain     Status: None (Preliminary result)   Collection Time: 08/30/19  8:37 AM   Specimen: Sputum  Result Value Ref Range Status   Fungus Stain Final report  Final    Comment: (NOTE) Performed At: [facility] [place], [place] [id number] Rush Farmer MD QG:[id number]    Fungus (Mycology) Culture PENDING  Incomplete   Fungal Source BRONCHIAL ALVEOLAR LAVAGE  Final    Comment: Performed at [facility], [place] [address], [place], [place] [address]  Fungus Culture Result     Status: None   Collection Time: 08/30/19  8:37 AM  Result Value Ref Range Status   Result 1 Comment  Final    Comment: (NOTE) KOH/Calcofluor preparation:  no fungus observed. Performed At: [facility] [place], [place] [id number] Rush Farmer MD BR:[id number]      Studies: No results found.    Flora Lipps, MD  [facility] 09/02/2019

## 2019-09-02 NOTE — Plan of Care (Signed)
Patient sitting up in chair, states she is ready to go home and she states according to her heart doctor she is ready. Awaiting other MD's to assess. Will continue to monitor.  Problem: Education: Goal: Knowledge of General Education information will improve Description: Including pain rating scale, medication(s)/side effects and non-pharmacologic comfort measures Outcome: Progressing   Problem: Health Behavior/Discharge Planning: Goal: Ability to manage health-related needs will improve Outcome: Progressing   Problem: Clinical Measurements: Goal: Ability to maintain clinical measurements within normal limits will improve Outcome: Progressing Goal: Will remain free from infection Outcome: Progressing Goal: Diagnostic test results will improve Outcome: Progressing Goal: Respiratory complications will improve Outcome: Progressing Goal: Cardiovascular complication will be avoided Outcome: Progressing   Problem: Activity: Goal: Risk for activity intolerance will decrease Outcome: Progressing   Problem: Nutrition: Goal: Adequate nutrition will be maintained Outcome: Progressing   Problem: Coping: Goal: Level of anxiety will decrease Outcome: Progressing   Problem: Elimination: Goal: Will not experience complications related to bowel motility Outcome: Progressing Goal: Will not experience complications related to urinary retention Outcome: Progressing   Problem: Pain Managment: Goal: General experience of comfort will improve Outcome: Progressing   Problem: Safety: Goal: Ability to remain free from injury will improve Outcome: Progressing   Problem: Skin Integrity: Goal: Risk for impaired skin integrity will decrease Outcome: Progressing

## 2019-09-02 NOTE — Progress Notes (Signed)
Updated daughter Deborah Shields  Patient is stable from a respiratory perspective for discharge She stated she has not coughed up any blood this morning  Should be scheduled to see Dr. Tamala Julian in about 2 to 3 weeks

## 2019-09-02 NOTE — Progress Notes (Signed)
Patient coughed up a quarter size blood clot, saved for MD to see. Paged Dr Corrie Mckusick Pokhrel with results. Patient IV was hurting her, small red area noted and IV removed. Patient asked to keep out until I paged MD and found out if she is being d/c. Also asked MD in paged about IV. Will continue to monitor.

## 2019-09-02 NOTE — Progress Notes (Signed)
Occupational Therapy Treatment Patient Details Name: Deborah Shields MRN: [id number] DOB: Dec 17, 1940 Today's Date: 09/02/2019    History of present illness Deborah Shields is a 79 y.o. female with medical history significant of stage III chronic kidney disease, dyslipidemia, hypertension, arthritis, and paroxysmal atrial fibrillation, DVT. Patient presented secondary to hemoptysis and found to have likely atypical pneumonia. She was also found to have afib with RVR .Patient underwent bronchoscopy on 3/8 which was significant for blood/clot and BAL performed. Patient returned to atrial fibrillation with resultant rapid ventricular response.   OT comments  Pt plans go have A from family upon DC  Follow Up Recommendations  Supervision/Assistance - 24 hour    Equipment Recommendations  None recommended by OT    Recommendations for Other Services      Precautions / Restrictions Precautions Precautions: Fall Precaution Comments: She has significant heart history- including paroxysmal A Fib. Restrictions Weight Bearing Restrictions: No       Mobility Bed Mobility Overal bed mobility: Needs Assistance Bed Mobility: Sit to Supine     Supine to sit: Min guard Sit to supine: Min assist   General bed mobility comments: min A for LEs into bed  Transfers Overall transfer level: Needs assistance Equipment used: 1 person hand held assist Transfers: Sit to/from [facility] Sit to Stand: Min assist Stand pivot transfers: Min assist            Balance Overall balance assessment: Needs assistance Sitting-balance support: No upper extremity supported Sitting balance-Leahy Scale: Good     Standing balance support: Single extremity supported Standing balance-Leahy Scale: Fair                             ADL either performed or assessed with clinical judgement   ADL Overall ADL's : Needs assistance/impaired                          Toilet Transfer: Supervision/safety;Cueing for sequencing;Cueing for Furniture conservator/restorer;Ambulation   Toileting- Clothing Manipulation and Hygiene: Supervision/safety;Cueing for safety       Functional mobility during ADLs: Supervision/safety                 Cognition Arousal/Alertness: Awake/alert Behavior During Therapy: WFL for tasks assessed/performed Overall Cognitive Status: Within Functional Limits for tasks assessed                                                     Pertinent Vitals/ Pain       Pain Assessment: Faces Faces Pain Scale: Hurts little more Pain Location: L forearm at site of infiltrated IV site Pain Descriptors / Indicators: Sore Pain Intervention(s): Heat applied;Monitored during session     Prior Functioning/Environment              Frequency  Min 2X/week        Progress Toward Goals  OT Goals(current goals can now be found in the care plan section)  Progress towards OT goals: Progressing toward goals  Acute Rehab OT Goals Patient Stated Goal: Just want to feel better and get stronger, go home   AM-PAC OT "6 Clicks" Daily Activity     Outcome Measure   Help from another person eating meals?: None Help from another person taking  care of personal grooming?: A Little Help from another person toileting, which includes using toliet, bedpan, or urinal?: A Little Help from another person bathing (including washing, rinsing, drying)?: A Little Help from another person to put on and taking off regular upper body clothing?: None Help from another person to put on and taking off regular lower body clothing?: A Little 6 Click Score: 20    End of Session Equipment Utilized During Treatment: Gait belt  OT Visit Diagnosis: Unsteadiness on feet (R26.81);Other abnormalities of gait and mobility (R26.89);Muscle weakness (generalized) (M62.81);History of falling (Z91.81)   Activity Tolerance Patient tolerated treatment  well   Patient Left in chair;with call bell/phone within reach;with family/visitor present   Nurse Communication Mobility status        Time: 4010-2725 OT Time Calculation (min): 17 min  Charges: OT General Charges $OT Visit: 1 Visit OT Treatments $Self Care/Home Management : 8-22 mins  Kari Baars, [place] [phone] Office- [phone]      Mc[place]osh, Edwena Felty D 09/02/2019, 2:35 PM

## 2019-09-02 NOTE — Progress Notes (Signed)
Progress Note  Patient Name: Deborah Shields Date of Encounter: 09/02/2019  Primary Cardiologist: Deborah Klein, MD   Subjective   No cardiac issues overnight   Inpatient Medications    . acidophilus   Oral QPC breakfast  . B-complex with vitamin C  1 tablet Oral QPC supper  . Chlorhexidine Gluconate Cloth  6 each Topical Daily  . Chlorhexidine Gluconate Cloth  6 each Topical Q0600  . chlorpheniramine-HYDROcodone  5 mL Oral Q12H  . flecainide  50 mg Oral Q12H  . levothyroxine  75 mcg Oral QAC breakfast  . LORazepam  0.5 mg Oral QHS  . meclizine  12.5-25 mg Oral QHS  . metoprolol tartrate  25 mg Oral BID  . multivitamin with minerals  1 tablet Oral QPC supper  . mupirocin ointment  1 application Nasal BID  . senna  1 tablet Oral Daily    PRN Meds: acetaminophen **OR** acetaminophen, albuterol, cyclobenzaprine, guaiFENesin-dextromethorphan, ondansetron **OR** ondansetron (ZOFRAN) IV, phenol, traMADol   Vital Signs    Vitals:   09/01/19 1453 09/01/19 1457 09/01/19 2236 09/02/19 0522  BP:  122/69 133/68 (!) 148/75  Pulse:  75 80 73  Resp:  _0 Temp:  98.6 F (37 C) 98.8 F (37.1 C) 98.3 F (36.8 C)  TempSrc:  Oral Oral Oral  SpO2: (!) 5% 94% 93% 95%  Weight:      Height:        Intake/Output Summary (Last 24 hours) at 09/02/2019 0909 Last data filed at 09/02/2019 0600 Gross per 24 hour  Intake 240 ml  Output --  Net 240 ml   Last 3 Weights 08/28/2019 08/28/2019 02/23/2019  Weight (lbs) 110 lb 0.2 oz 114 lb 114 lb  Weight (kg) 49.9 kg 51.71 kg 51.71 kg      Telemetry    NSR rates 60-75  Personally Reviewed  ECG    NSR rate 69 normal intervals 08/31/19  Physical Exam   GEN: No acute distress.   Neck: No JVD Cardiac: RRR, no murmurs, rubs, or gallops.  Respiratory: Clear to auscultation bilaterally. GI: Soft, nontender, non-distended  MS: No edema; No deformity. Neuro:  Nonfocal  Psych: Normal affect   Labs    High Sensitivity Troponin:    Recent Labs  Lab 08/28/19 1247 08/28/19 1447  TROPONINIHS 18* 17      Chemistry Recent Labs  Lab 08/28/19 1315 08/29/19 0316 08/30/19 1246 09/01/19 0616 09/02/19 0458  NA  --    < > 141 138 136  K  --    < > 3.7 3.8 4.0  CL  --    < > 107 106 103  CO2  --    < > _1 GLUCOSE  --    < > 116* 111* 102*  BUN  --    < > _2 CREATININE  --    < > 1.29* 1.25* 1.43*  CALCIUM  --    < > 8.7* 8.6* 8.7*  PROT 7.0  --   --   --   --   ALBUMIN 3.5  --   --   --   --   AST 27  --   --   --   --   ALT 15  --   --   --   --   ALKPHOS 90  --   --   --   --   BILITOT 1.1  --   --   --   --  GFRNONAA  --    < > 39* 41* 35*  GFRAA  --    < > 45* 47* 40*  ANIONGAP  --    < > _0 < > = values in this interval not displayed.     Hematology Recent Labs  Lab 08/30/19 0553 09/01/19 0616 09/02/19 0458  WBC 7.0 7.9 8.0  RBC 3.83* 3.86* 3.55*  HGB 11.1* 11.4* 10.5*  HCT 35.1* 36.0 33.0*  MCV 91.6 93.3 93.0  MCH 29.0 29.5 29.6  MCHC 31.6 31.7 31.8  RDW 13.6 13.6 13.4  PLT 228 235 238    BNPNo results for input(s): BNP, PROBNP in the last 168 hours.   DDimer No results for input(s): DDIMER in the last 168 hours.   Radiology    No results found.  Cardiac Studies   Echocardiogram 09/01/2019: Impressions: 1. Left ventricular ejection fraction, by estimation, is 55 to 60%. The  left ventricle has normal function. The left ventricle has no regional  wall motion abnormalities. Left ventricular diastolic parameters were  normal.  2. Right ventricular systolic function is normal. The right ventricular  size is normal. There is normal pulmonary artery systolic pressure. The  estimated right ventricular systolic pressure is 62.9 mmHg.  3. The mitral valve is normal in structure. No evidence of mitral valve  regurgitation.  4. The aortic valve is tricuspid. Aortic valve regurgitation is mild to  moderate. Mild aortic valve stenosis. Aortic valve area, by VTI  measures  1.42 cm. Aortic valve mean gradient measures 11.0 mmHg. Aortic valve Vmax  measures 2.27 m/s.  5. The inferior vena cava is normal in size with greater than 50%  respiratory variability, suggesting right atrial pressure of 3 mmHg.  Patient Profile     Deborah Shields is a 79 y.o. female with a history of atrial fibrillation in 2012 after shoulder surgency not on anticoagulation due to no recurrence, bilateral carotid artery disease secondary to fibromuscular dysplasia, hypertension, hyperlipidemia, hypothyroidism, and CKD stage IIIwho is being seen today for the evaluation of atrial fibrillation with RVRat the request ofDr. Nettey.  Assessment & Plan    Paroxysmal Atrial Fibrillation with RVR - Patient presented with hemoptysis and was found to be in atrial fibrillation. Converted with IV Cardizem. Long post-conversion pauses noted (about 8 seconds)s.  History of post-op atrial fibrillation in 2012 following shoulder Shields but this was an isolated event so patient not on any anticoagulation.  - Echo this admission showed LVEF of 55-60% with normal wall motion and diastolic function. Mild to moderate aortic regurgitation and mild aortic stenosis noted.  - Continue Lopressor 87m twice daily.  - Flecainide 585mtwice daily seems to be helping  - doing well on flecainide will update ECG in am but intervals have been normal with no QRS/QT widening  - CHA2DS2-VASc = 5 ( HTN, prior PE, age x2, female). Recommend DOAC after hemoptysis has been fully evaluated and resolved. Restart when OK with Pulmonology. - Will arrange for 2 week live Zio monitor through our office. [facility]   Carotid Artery Disease - Most recent carotid ultrasounds in 12/2018 showed 1-39% stenosis of bilateral ICAs. Elevated velocity in distal left ICA suggestive of fibromuscular dysplasia.  - Aspirin currently on hold due to hemoptysis.  - Intolerant to statins. - Followed by  Deborah Shields.   Hypertension - BP mildly elevated at times. Most recent BP 148/75 this morning  prior to any medications. -Continue Lopressor as above.  CKD Stage III - Creatinine 1.43 today. Baseline appears to be around 1.2 to 1.4. BUN has improved.  - Continue to avoid Nephrotoxic agents.  - Continue to monitor closely.  Hemoptysis - Patient underwent bronchoscopy today which showedblood clot int he left mainstem bronchus and in the superior lingula segment of the left upper lobe as well as granulation tissue in the left mainstem bronchus. Brushings showed benign changes. Final BAL cultures pending.  - Management per Pulmonology.  - Will keep off anticoagulation for at least 2-3 weeks and readdress as outpatient especially since AAT seems to be keeping her in NSR   For questions or updates, please contact [place] Please consult [email or web address] for contact info under        Signed, Jenkins Rouge, MD  09/02/2019, 9:09 AM

## 2019-09-02 NOTE — Progress Notes (Signed)
   NAME:  Deborah Shields, MRN:  [id number], DOB:  01/24/1941, LOS: 5 ADMISSION DATE:  08/28/2019, CONSULTATION DATE:  08/29/19 REFERRING MD:  Lonny Prude, CHIEF COMPLAINT:  Hemoptysis   Brief History   79 year old woman w/ hx PAD p/w hemoptysis.  History of present illness   79 year old woman w/ hx PAD p/w hemoptysis.  Fair amount estimate ~100cc's yesterday.  None this AM.  Had this issue previously, originated from lingula, cultures grew MRSA at that time.  Sputum sent for culture this time.  PCCM consulted for potential bronchoscopy.  Patient does have some unintentional weight loss in 2017 that was attributed to tickborne illness, this has since stabilized.  On baby aspirin at home.  No recent sick contacts.  No infectious symptoms.  Admits to trouble swallowing that GI has evaluated her for and neg workup including EGD.  Past Medical History  PAD Prior PE Arthritis  Significant Hospital Events   08/28/19 admitted 3/8 bronchoscopy which showed clot in lingula and LUL bronchus with granulation tissue in lingula  Consults:  PCCM  Procedures:  N/A  Significant Diagnostic Tests:  CTA chest 3/6- lingular infiltrate Echo 3/9 > LVEF 55-60%, RVSP is normal.  No change from prior.  Micro Data:  Sputum 3/8 >  BAL 3/8 > AFB 3/8 >   Antimicrobials:  Received vanc/ceftriaxone/azithromycin in ER   Interim history/subjective:  Denies any further episodes of hemoptysis Respiratory status is stable   Objective   Blood pressure (!) 148/75, pulse 73, temperature 98.3 F (36.8 C), temperature source Oral, resp. rate 13, height _0  (1.676 m), weight 49.9 kg, SpO2 95 %.        Intake/Output Summary (Last 24 hours) at 09/02/2019 1109 Last data filed at 09/02/2019 0600 Gross per 24 hour  Intake 240 ml  Output --  Net 240 ml   Filed Weights   08/28/19 1241 08/28/19 1800  Weight: 51.7 kg 49.9 kg    Examination: Blood pressure 122/69, pulse 75, temperature 98.6 F (37 C), temperature  source Oral, resp. rate 14, height _1  (1.676 m), weight 49.9 kg, SpO2 94 %. Gen:      Stable, looks comfortable HEENT:  EOMI, sclera anicteric Neck:     No masses; no thyromegaly Lungs:    Clear breath sounds bilaterally CV:         Regular rate and rhythm; no murmurs Abd:      Bowel sounds appreciated Ext:    No edema; adequate peripheral perfusion Skin:      Warm and dry; no rash Neuro: alert and oriented x 3 Psych: normal mood and affect  Assessment & Plan:  Hemoptysis, abnormal CT scan of the chest -Brushings is within normal limits -BAL cultures negative to date -Will benefit from outpatient follow-up  Paroxysmal atrial fibrillation -Continue Lopressor -Starting flecainide  Sherrilyn Rist, MD [place], PCCM

## 2019-09-02 NOTE — TOC Initial Note (Addendum)
Transition of Care [facility]) - Initial/Assessment Note    Patient Details  Name: Deborah Shields MRN: [id number] Date of Birth: [birth year]  Transition of Care [facility]) CM/SW Contact:    Ross Ludwig, LCSW Phone Number: 09/02/2019, 12:44 PM  Clinical Narrative:                  Patient is an 79 year old female who lives with her daughter, patient is alert and oriented x3.  Patient lives with her daughter, patient has had home health in the past, initially, patient declined home health, but after speaking with the daughter, they are requesting home health PT, OT and RN through [facility].  CSW contacted [facility], and they can accept patient tomorrow if she is medically ready for discharge.  CSW explained to patient and family what to expect with home health, patient and family stated they are familiar with the process.  Patient and daughter, did not have any other questions or concerns.  Patient and family also said they do not need any DME, they have everything at the house already.  Expected Discharge Plan: [place] Barriers to Discharge: Continued Medical Work up   Patient Goals and CMS Choice Patient states their goals for this hospitalization and ongoing recovery are:: To return back home with her daughter and home health services. CMS Medicare.gov Compare Post Acute Care list provided to:: Patient Choice offered to / list presented to : Patient, Adult Children  Expected Discharge Plan and Services Expected Discharge Plan: [place] In-house Referral: Clinical Social Work   Post Acute Care Choice: [place] arrangements for the past 2 months: [place]                 DME Arranged: N/A         HH Arranged: PT, RN [place] Agency: [place] [facility] Date HH Agency Contacted: 09/02/19 Time HH Agency Contacted: 53 Representative spoke with at [place]: Santiago Glad  Prior Living  Arrangements/Services Living arrangements for the past 2 months: [place] Lives with:: Adult Children Patient language and need for interpreter reviewed:: Yes Do you feel safe going back to the place where you live?: Yes      Need for Family Participation in Patient Care: No (Comment) Care giver support system in place?: No (comment)   Criminal Activity/Legal Involvement Pertinent to Current Situation/Hospitalization: No - Comment as needed  Activities of Daily Living Home Assistive Devices/Equipment: None ADL Screening (condition at time of admission) Patient's cognitive ability adequate to safely complete daily activities?: Yes Is the patient deaf or have difficulty hearing?: No Does the patient have difficulty seeing, even when wearing glasses/contacts?: No Does the patient have difficulty concentrating, remembering, or making decisions?: No Patient able to express need for assistance with ADLs?: No Does the patient have difficulty dressing or bathing?: No Independently performs ADLs?: Yes (appropriate for developmental age) Does the patient have difficulty walking or climbing stairs?: No Weakness of Legs: None Weakness of Arms/Hands: None  Permission Sought/Granted Permission sought to share information with : Family Supports Permission granted to share information with : Yes, Verbal Permission Granted  Share Information with NAME: [name] Daughter   [phone]  Permission granted to share info w AGENCY: Home Health Agency        Emotional Assessment Appearance:: Appears stated age Attitude/Demeanor/Rapport: Engaged Affect (typically observed): Accepting, Appropriate, Calm Orientation: : Oriented to Self, Oriented to Place, Oriented  to  Time Alcohol / Substance Use: Not Applicable Psych Involvement: No (comment)  Admission diagnosis:  Pneumonia [J18.9] Patient Active Problem List   Diagnosis Date Noted  . Pneumonia 08/28/2019  . Pneumonia of right upper  lobe due to methicillin resistant Staphylococcus aureus (MRSA) ([place])   . Pneumonia due to infectious organism   . Sepsis ([place]) 08/16/2018  . Community acquired pneumonia 08/16/2018  . Malnutrition of moderate degree 07/16/2017  . Hemoptysis 07/15/2017  . HNP (herniated nucleus pulposus), lumbar 03/20/2017  . Edema of both feet 12/10/2016  . Tick bite 11/21/2015  . Abnormal urine odor 11/21/2015  . Shortness of breath 11/21/2015  . Other fatigue 11/21/2015  . Anxiety 08/15/2015  . Encounter for pre-operative cardiovascular clearance 07/10/2015  . Pre-operative clearance 07/07/2015  . Rotator cuff tear 07/07/2015  . Rectocele, female 08/04/2014  . COPD with chronic bronchitis and emphysema ([place]) 02/14/2014  . Other malaise and fatigue 12/31/2013  . PAF (paroxysmal atrial fibrillation) ([place]) 01/10/2013  . Thrombosed external hemorrhoid 11/26/2012  . Chronic renal insufficiency, stage III (moderate) 09/03/2011  . Atherosclerosis of native arteries of the extremities with intermittent claudication 04/04/2011  . Occlusion and stenosis of carotid artery without mention of cerebral infarction 04/04/2011  . PERIPHERAL NEUROPATHY 08/15/2008  . Hypothyroidism 08/12/2007  . HYPERLIPIDEMIA 08/12/2007  . Essential hypertension 08/12/2007  . Osteopenia 08/12/2007  . Carotid artery stenosis, asymptomatic 08/11/2007  . Osteoarthritis 08/11/2007  . BASAL CELL CARCINOMA, HX OF 08/11/2007  . FIBROCYSTIC BREAST DISEASE 11/18/2006  . DIVERTICULOSIS, COLON 08/10/2006   PCP:  Lajean Manes, MD Pharmacy:   [facility] [place], [address] [place] [address] Phone: [phone] Fax: [phone]  [facility] [place] [place] [facility] [address] [employer] [address] [place] [address] Phone: [phone] Fax: [phone]     Social  Determinants of Health (SDOH) Interventions    Readmission Risk Interventions No flowsheet data found.

## 2019-09-02 NOTE — Progress Notes (Signed)
Physical Therapy Treatment Patient Details Name: Deborah Shields MRN: [id number] DOB: 04-30-1941 Today's Date: 09/02/2019    History of Present Illness Deborah Shields is a 79 y.o. female with medical history significant of stage III chronic kidney disease, dyslipidemia, hypertension, arthritis, and paroxysmal atrial fibrillation, DVT. Patient presented secondary to hemoptysis and found to have likely atypical pneumonia. She was also found to have afib with RVR .Patient underwent bronchoscopy on 3/8 which was significant for blood/clot and BAL performed. Patient returned to atrial fibrillation with resultant rapid ventricular response.    PT Comments    Pt's daughter reported that pt coughed up a blood clot just prior to PT session, RN is aware. Pt stated she's too fatigued to ambulate but requested assistance getting from recliner to bed. Min assist for sit to stand and to pivot to bed, min A for sit to supine. Pt's daughter and pt voiced dissatisfaction with nursing care, secretary to notify unit director to address these concerns.    Follow Up Recommendations  Home health PT     Equipment Recommendations  (has all necessary equipment.)    Recommendations for Other Services       Precautions / Restrictions Precautions Precautions: Fall Precaution Comments: She has significant heart history- including paroxysmal A Fib. Restrictions Weight Bearing Restrictions: No    Mobility  Bed Mobility Overal bed mobility: Needs Assistance Bed Mobility: Sit to Supine     Supine to sit: Min guard Sit to supine: Min assist   General bed mobility comments: min A for LEs into bed  Transfers Overall transfer level: Needs assistance Equipment used: 1 person hand held assist Transfers: Sit to/from [facility] Sit to Stand: Min assist Stand pivot transfers: Min assist          Ambulation/Gait             General Gait Details: deferred 2* fatigue, pt  coughed up a blood clot just prior to PT session, RN aware   Stairs             Wheelchair Mobility    Modified Rankin (Stroke Patients Only)       Balance Overall balance assessment: Needs assistance Sitting-balance support: No upper extremity supported Sitting balance-Leahy Scale: Good     Standing balance support: Single extremity supported Standing balance-Leahy Scale: Fair                              Cognition Arousal/Alertness: Awake/alert Behavior During Therapy: WFL for tasks assessed/performed Overall Cognitive Status: Within Functional Limits for tasks assessed                                        Exercises      General Comments        Pertinent Vitals/Pain Pain Assessment: Faces Faces Pain Scale: Hurts little more Pain Location: L forearm at site of infiltrated IV site Pain Descriptors / Indicators: Sore Pain Intervention(s): Heat applied;Monitored during session    Home Living                      Prior Function            PT Goals (current goals can now be found in the care plan section) Acute Rehab PT Goals Patient Stated Goal: Just want to feel better and  get stronger, go home PT Goal Formulation: With patient Time For Goal Achievement: 09/15/19 Potential to Achieve Goals: Good Progress towards PT goals: Progressing toward goals    Frequency    Min 3X/week      PT Plan Current plan remains appropriate    Co-evaluation              AM-PAC PT "6 Clicks" Mobility   Outcome Measure  Help needed turning from your back to your side while in a flat bed without using bedrails?: A Little Help needed moving from lying on your back to sitting on the side of a flat bed without using bedrails?: A Little Help needed moving to and from a bed to a chair (including a wheelchair)?: A Little Help needed standing up from a chair using your arms (e.g., wheelchair or bedside chair)?: A Little Help  needed to walk in hospital room?: A Little Help needed climbing 3-5 steps with a railing? : A Little 6 Click Score: 18    End of Session Equipment Utilized During Treatment: Gait belt Activity Tolerance: Patient limited by fatigue Patient left: in bed;with call bell/phone within reach;with bed alarm set;with family/visitor present Nurse Communication: Mobility status PT Visit Diagnosis: Muscle weakness (generalized) (M62.81)     Time: 7939-0300 PT Time Calculation (min) (ACUTE ONLY): 9 min  Charges:  $Therapeutic Activity: 8-22 mins                     Blondell Reveal Kistler PT 09/02/2019  Acute Rehabilitation Services Pager [phone] Office [phone]

## 2019-09-03 ENCOUNTER — Telehealth: Payer: Self-pay | Admitting: Radiology

## 2019-09-03 ENCOUNTER — Telehealth: Payer: Self-pay | Admitting: Cardiovascular Disease

## 2019-09-03 LAB — BASIC METABOLIC PANEL
Anion gap: 9 (ref 5–15)
BUN: 19 mg/dL (ref 8–23)
CO2: 27 mmol/L (ref 22–32)
Calcium: 8.8 mg/dL — ABNORMAL LOW (ref 8.9–10.3)
Chloride: 103 mmol/L (ref 98–111)
Creatinine, Ser: 1.53 mg/dL — ABNORMAL HIGH (ref 0.44–1.00)
GFR calc Af Amer: 37 mL/min — ABNORMAL LOW (ref >60)
GFR calc non Af Amer: 32 mL/min — ABNORMAL LOW (ref >60)
Glucose, Bld: 99 mg/dL (ref 70–99)
Potassium: 4.1 mmol/L (ref 3.5–5.1)
Sodium: 139 mmol/L (ref 135–145)

## 2019-09-03 LAB — CBC
HCT: 34.2 % — ABNORMAL LOW (ref 36.0–46.0)
Hemoglobin: 10.8 g/dL — ABNORMAL LOW (ref 12.0–15.0)
MCH: 29.5 pg (ref 26.0–34.0)
MCHC: 31.6 g/dL (ref 30.0–36.0)
MCV: 93.4 fL (ref 80.0–100.0)
Platelets: 251 10*3/uL (ref 150–400)
RBC: 3.66 MIL/uL — ABNORMAL LOW (ref 3.87–5.11)
RDW: 13.3 % (ref 11.5–15.5)
WBC: 6.2 10*3/uL (ref 4.0–10.5)
nRBC: 0 % (ref 0.0–0.2)

## 2019-09-03 NOTE — Telephone Encounter (Signed)
Left message regarding 09/14/19 appointment with Roby Lofts, PA that needs to be moved out 2 weeks---Patient is wearing a monitor

## 2019-09-03 NOTE — Progress Notes (Signed)
   NAME:  Deborah Shields, MRN:  [id number], DOB:  Feb 23, 1941, LOS: 6 ADMISSION DATE:  08/28/2019, CONSULTATION DATE:  08/29/19 REFERRING MD:  Lonny Prude, CHIEF COMPLAINT:  Hemoptysis   Brief History   79 year old woman w/ hx PAD p/w hemoptysis.  History of present illness   79 year old woman w/ hx PAD p/w hemoptysis.  Fair amount estimate ~100cc's yesterday.  None this AM.  Had this issue previously, originated from lingula, cultures grew MRSA at that time.  Sputum sent for culture this time.  PCCM consulted for potential bronchoscopy.  Patient does have some unintentional weight loss in 2017 that was attributed to tickborne illness, this has since stabilized.  On baby aspirin at home.  No recent sick contacts.  No infectious symptoms.  Admits to trouble swallowing that GI has evaluated her for and neg workup including EGD.    Past Medical History  PAD Prior PE Arthritis  Significant Hospital Events   08/28/19 admitted 3/8 bronchoscopy which showed clot in lingula and LUL bronchus with granulation tissue in lingula  Consults:  PCCM  Procedures:  N/A  Significant Diagnostic Tests:  CTA chest 3/6- lingular infiltrate Echo 3/9 > LVEF 55-60%, RVSP is normal.  No change from prior.  Micro Data:  Sputum 3/8 >  BAL 3/8 > AFB 3/8 >   Antimicrobials:  Received vanc/ceftriaxone/azithromycin in ER  Had 3 days of azithromycin 3 days of ceftriaxone 1 day of vancomycin  Interim history/subjective:  Patient did still have some hemoptysis yesterday 3/11 None today so far Respiratory status is stable  Objective   Blood pressure (!) 151/80, pulse 65, temperature (!) 97.5 F (36.4 C), temperature source Oral, resp. rate 16, height _0  (1.676 m), weight 49.9 kg, SpO2 96 %.        Intake/Output Summary (Last 24 hours) at 09/03/2019 0809 Last data filed at 09/03/2019 0500 Gross per 24 hour  Intake 980.67 ml  Output --  Net 980.67 ml   Filed Weights   08/28/19 1241 08/28/19 1800   Weight: 51.7 kg 49.9 kg    Examination: Blood pressure 122/69, pulse 75, temperature 98.6 F (37 C), temperature source Oral, resp. rate 14, height _1  (1.676 m), weight 49.9 kg, SpO2 94 %. Gen:      Stable, looks comfortable HEENT: Moist oral mucosa Neck:     No masses; no thyromegaly Lungs:    Clear breath sounds bilaterally CV:         Regular rate and rhythm; no murmurs Abd:      Bowel sounds appreciated Ext:    No edema; adequate peripheral perfusion  Assessment & Plan:  Hemoptysis, abnormal CT scan of the chest -Brushings is within normal limits -BAL cultures negative to date -No other acute intervention needed at present time -Follow-up as outpatient will be appropriate -Bronchoscopy did not yield clot in the airway, airway granulomatous changes which was brushed revealing benign tissue  Paroxysmal atrial fibrillation -Continue Lopressor -Starting flecainide  No other invasive intervention is needed at present She may still have some mild hemoptysis to clear upper airway of residual bleed Has remained hemodynamically stable Follow-up with Dr. Tamala Julian in 2 to 3 weeks as outpatient Did speak with daughter yesterday 3/11  Sherrilyn Rist, MD [place], PCCM

## 2019-09-03 NOTE — Progress Notes (Signed)
   Paged around 11:30 having more post-conversion pauses. Patient went back into atrial fibrillation late this morning and went in and out of this a few times. She had multiple post-conversion pauses in the 6 to 7 second range. Patient did report dizziness and near syncope during these time. Reviewed telemetry and she was back in sinus rhythm around 12pm.   Discussed with Dr. Johnsie Cancel. No change in plans at this time. Continue Lopressor 25mg  twice daily and Flecainide 50mg  twice daily. Will given Flecainide more time to load. Outpatient monitor has already been ordered. May end up needing PPM in the future.    Darreld Mclean, PA-C 09/03/2019 2:05 PM

## 2019-09-03 NOTE — Progress Notes (Signed)
RN notified by telemetry that patient had converted to Afib and patient had a 2.6 sec pause.  RN to room.  Patient's rate 103 BP 156/95.  Patient stated she felt fine.  However, patient stated she felt dizzy.  CCMD called RN and notified patient had a 7.2 second pause.  Dr. Louanne Belton notified via text page.  Patient resting in bed.  Reports she, "has had these episodes before and felt the same way."    1109 - EKG results Afib.  Dr. Dagoberto Reef notified via text page.

## 2019-09-03 NOTE — Progress Notes (Signed)
Patient had a 7.6 pause -= converted back to NSR and then back to Afib.  Dr. Dagoberto Reef and Pokhrel notified via text page.  Patient resting in bed. CN and RR also notified.

## 2019-09-03 NOTE — Progress Notes (Signed)
PROGRESS NOTE  Deborah Shields QHU:[id number] DOB: Aug 29, 1940 DOA: 08/28/2019 PCP: Lajean Manes, MD   LOS: 6 days   Brief narrative: As per HPI,  Deborah Shields is a 79 y.o. femalewith medical history significant ofstage III chronic kidney disease, dyslipidemia, hypertension, arthritis, and paroxysmal atrial fibrillation. Patient presented secondary to hemoptysis and found to have likely atypical pneumonia. Patient started on empiric Vancomycin, Ceftriaxone and Azithromycin. She was also found to have afib with RVR and started on a diltiazem drip which was discontinued. Patient underwent bronchoscopy on 3/8 which was significant for blood/clot and BAL performed. Patient returned to atrial fibrillation with resultant rapid ventricular response.  Assessment/Plan:  Principal Problem:   Hemoptysis Active Problems:   Hypothyroidism   HYPERLIPIDEMIA   Essential hypertension   PAF (paroxysmal atrial fibrillation) (HCC)   COPD with chronic bronchitis and emphysema (HCC)   Pneumonia   Hemoptysis with groundglass opacities. Improved.  No further hemoptysis.  Possibility of atypical pneumonia on this admission. Patient underwent bronchoscopy with benign bronchoalveolar lavage findings.  Continue to hold aspirin.  Pulmonary recommend outpatient follow-up.  Fungal culture negative so far.  AFB pending.  Initially patient received Rocephin and Zithromax.  Discontinued antibiotics 3/ 8 as per pulmonary recommendation. Plan is to follow-up with Dr. Tamala Julian with pulmonary as outpatient in 2 to 3 weeks.  It is magnesium of 2.4.  Paroxysmal atrial fibrillation with RVR  After bronchoscopy.  Nursing staff reporting significant pause of 7 seconds with dizziness.  Cardiology has been renotified today.  On metoprolol and flecainide.  Will have cardiology decide on further recommendation.    2D echocardiogram showed left ventricular ejection fraction of 55 to 60% with normal wall motion.  TSH  slightly low at 0.262.    Continue to replenish lites as necessary.  Does haveCHA2DS2-VASc = 5 ( HTN, prior PE, age x2, female) so will need anticoagulation when okay with pulmonary.  Cardiology recommends against anticoagulation until at least 2 to 3 weeks and to be addressed as outpatient.  Patient will need 2 weeks Zio monitor on discharge.  Hypothyroidism TSH slightly low.  Dose of Synthroid was decreased to 75 mcg daily  Essential hypertension.  Continue metoprolol.  We will continue to monitor Anxiety -Continue Ativan prn  CKD stage IIIb Stable.  Closely monitor BMP.  Creatinine was 1.4.  VTE Prophylaxis: SCD  Code Status: Full code  Family Communication: None today.  Spoke with the patient's daughter at length yesterday.  Disposition Plan:  . Patient is from home . Likely disposition to home with home health likely by tomorrow okay with cardiology..  PT has recommended home health PT on discharge . Barriers to discharge: Significant pause, cardiology decision,   Face-to-face done with home health PT OT and RN.   Consultants:  Pulmonary  Cardiology  Procedures:  Bronchoscopy on 08/30/2019  Antibiotics:  . None  Anti-infectives (From admission, onward)   Start     Dose/Rate Route Frequency Ordered Stop   08/30/19 1600  azithromycin (ZITHROMAX) tablet 500 mg     500 mg Oral Daily 08/30/19 1450 08/31/19 1025   08/28/19 1800  cefTRIAXone (ROCEPHIN) 1 g in sodium chloride 0.9 % 100 mL IVPB  Status:  Discontinued     1 g 200 mL/hr over 30 Minutes Intravenous Daily 08/28/19 1728 08/30/19 1454   08/28/19 1800  azithromycin (ZITHROMAX) tablet 500 mg     500 mg Oral Daily 08/28/19 1728 08/28/19 1751   08/28/19 1615  vancomycin (VANCOCIN) IVPB 1000  mg/200 mL premix     1,000 mg 200 mL/hr over 60 Minutes Intravenous  Once 08/28/19 1601 08/28/19 1850   08/28/19 1615  cefTRIAXone (ROCEPHIN) 1 g in sodium chloride 0.9 % 100 mL IVPB  Status:  Discontinued     1 g 200 mL/hr  over 30 Minutes Intravenous  Once 08/28/19 1601 08/28/19 1735     Subjective: The, patient was seen and examined at bedside.  Nursing staff reported that she was dizzy with near passing out spell and had significant pause on the monitor of around 7 seconds.  Patient however denied any further hemoptysis or chest pain.  Objective: Vitals:   09/03/19 0953 09/03/19 1052  BP: (!) 152/85 (!) 156/95  Pulse: 65   Resp:    Temp:    SpO2:      Intake/Output Summary (Last 24 hours) at 09/03/2019 1208 Last data filed at 09/03/2019 0500 Gross per 24 hour  Intake 740.67 ml  Output --  Net 740.67 ml   Filed Weights   08/28/19 1241 08/28/19 1800  Weight: 51.7 kg 49.9 kg   Body mass index is 17.76 kg/m.   Physical Exam: GENERAL: Patient is alert awake and oriented. Not in obvious distress.  Thinly built HENT: No scleral pallor or icterus. Pupils equally reactive to light. Oral mucosa is moist NECK: is supple, no gross swelling noted. CHEST: Clear to auscultation. No crackles or wheezes.  Diminished breath sounds bilaterally. CVS: S1 and S2 heard, systolic murmur noted.. Regular rate and rhythm.  ABDOMEN: Soft, non-tender, bowel sounds are present. EXTREMITIES: No edema. CNS: Cranial nerves are intact. No focal motor deficits. SKIN: warm and dry without rashes.  Data Review: I have personally reviewed the following laboratory data and studies,  CBC: Recent Labs  Lab 08/29/19 0316 08/30/19 0553 09/01/19 0616 09/02/19 0458 09/03/19 0455  WBC 7.8 7.0 7.9 8.0 6.2  HGB 10.8* 11.1* 11.4* 10.5* 10.8*  HCT 34.2* 35.1* 36.0 33.0* 34.2*  MCV 92.7 91.6 93.3 93.0 93.4  PLT 220 228 235 238 675   Basic Metabolic Panel: Recent Labs  Lab 08/29/19 0316 08/30/19 1246 09/01/19 0616 09/02/19 0458 09/03/19 0455  NA 137 141 138 136 139  K 3.6 3.7 3.8 4.0 4.1  CL 104 107 106 103 103  CO2 _0 GLUCOSE 100* 116* 111* 102* 99  BUN 32* _1 CREATININE 1.28* 1.29* 1.25*  1.43* 1.53*  CALCIUM 8.7* 8.7* 8.6* 8.7* 8.8*  MG  --  3.1* 2.3 2.4  --    Liver Function Tests: Recent Labs  Lab 08/28/19 1315  AST 27  ALT 15  ALKPHOS 90  BILITOT 1.1  PROT 7.0  ALBUMIN 3.5   No results for input(s): LIPASE, AMYLASE in the last 168 hours. No results for input(s): AMMONIA in the last 168 hours. Cardiac Enzymes: No results for input(s): CKTOTAL, CKMB, CKMBINDEX, TROPONINI in the last 168 hours. BNP (last 3 results) No results for input(s): BNP in the last 8760 hours.  ProBNP (last 3 results) No results for input(s): PROBNP in the last 8760 hours.  CBG: No results for input(s): GLUCAP in the last 168 hours. Recent Results (from the past 240 hour(s))  SARS CORONAVIRUS 2 (TAT 6-24 HRS) Nasopharyngeal Nasopharyngeal Swab     Status: None   Collection Time: 08/28/19  3:09 PM   Specimen: Nasopharyngeal Swab  Result Value Ref Range Status   SARS Coronavirus 2 NEGATIVE NEGATIVE Final    Comment: (NOTE)  SARS-CoV-2 target nucleic acids are NOT DETECTED. The SARS-CoV-2 RNA is generally detectable in upper and lower respiratory specimens during the acute phase of infection. Negative results do not preclude SARS-CoV-2 infection, do not rule out co-infections with other pathogens, and should not be used as the sole basis for treatment or other patient management decisions. Negative results must be combined with clinical observations, patient history, and epidemiological information. The expected result is Negative. Fact Sheet for Patients: SugarRoll.be Fact Sheet for Healthcare Providers: https://www.woods-mathews.com/ This test is not yet approved or cleared by the [place] FDA and  has been authorized for detection and/or diagnosis of SARS-CoV-2 by FDA under an Emergency Use Authorization (EUA). This EUA will remain  in effect (meaning this test can be used) for the duration of the COVID-19 declaration under Section  56 4(b)(1) of the Act, 21 U.S.C. section 360bbb-3(b)(1), unless the authorization is terminated or revoked sooner. Performed at [facility], [place] [address], [place], [place] [address]   MRSA PCR Screening     Status: Abnormal   Collection Time: 08/28/19  5:20 PM   Specimen: Nasal Mucosa; Nasopharyngeal  Result Value Ref Range Status   MRSA by PCR POSITIVE (A) NEGATIVE Final    Comment:        The GeneXpert MRSA Assay (FDA approved for NASAL specimens only), is one component of a comprehensive MRSA colonization surveillance program. It is not intended to diagnose MRSA infection nor to guide or monitor treatment for MRSA infections. RESULT CALLED TO, READ BACK BY AND VERIFIED WITH: Susann Givens RN 1911 08/28/19 JM Performed at [facility], [place] [address], [place], [place] [address]   Acid Fast Smear (AFB)     Status: None   Collection Time: 08/28/19  5:28 PM   Specimen: Sputum  Result Value Ref Range Status   AFB Specimen Processing Concentration  Final   Acid Fast Smear Negative  Final    Comment: (NOTE) Performed At: [facility] [place], [place] [id number] Rush Farmer MD [id number]    Source (AFB) SPU  Final    Comment: Performed at [facility], [place] [address], [place], [place] [address]  Culture, respiratory     Status: None   Collection Time: 08/30/19  8:37 AM   Specimen: Bronchoalveolar Lavage; Respiratory  Result Value Ref Range Status   Specimen Description   Final    BRONCHIAL ALVEOLAR LAVAGE Performed at [facility] [address] [place], [place] [address]    Special Requests   Final    NONE Performed at [facility], [place] [address], [place], [place] [address]    Gram Stain NO WBC SEEN NO ORGANISMS SEEN   Final   Culture   Final    NO GROWTH 2 DAYS Performed at [facility], [place] [address] [place], [place] [address]    Report Status  09/01/2019 FINAL  Final  Acid Fast Smear (AFB)     Status: None   Collection Time: 08/30/19  8:37 AM   Specimen: Sputum  Result Value Ref Range Status   AFB Specimen Processing Concentration  Final   Acid Fast Smear Negative  Final    Comment: (NOTE) Performed At: [facility] [place], [place] [id number] Rush Farmer MD [id number]    Source (AFB) BRONCHIAL ALVEOLAR LAVAGE  Final    Comment: Performed at [facility], [place] [address], [place], [place] [address]  Fungus Culture With Stain     Status: None (Preliminary result)   Collection Time: 08/30/19  8:37 AM   Specimen: Sputum  Result Value Ref Range Status   Fungus Stain Final report  Final    Comment: (NOTE) Performed At: [facility] [place], [place] [id number] Rush Farmer MD [id number]    Fungus (Mycology) Culture PENDING  Incomplete   Fungal Source BRONCHIAL ALVEOLAR LAVAGE  Final    Comment: Performed at [facility], [place] [address] [place], [place] [address]  Fungus Culture Result     Status: None   Collection Time: 08/30/19  8:37 AM  Result Value Ref Range Status   Result 1 Comment  Final    Comment: (NOTE) KOH/Calcofluor preparation:  no fungus observed. Performed At: [facility] [place], [place] [id number] Rush Farmer MD [id number]      Studies: No results found.    Flora Lipps, MD  [facility] 09/03/2019

## 2019-09-03 NOTE — Progress Notes (Signed)
Progress Note  Patient Name: Deborah Shields Date of Encounter: 09/03/2019  Primary Cardiologist: Sanda Klein, MD   Subjective   No cardiac issues overnight   Inpatient Medications    . acidophilus   Oral QPC breakfast  . B-complex with vitamin C  1 tablet Oral QPC supper  . Chlorhexidine Gluconate Cloth  6 each Topical Daily  . chlorpheniramine-HYDROcodone  5 mL Oral Q12H  . flecainide  50 mg Oral Q12H  . levothyroxine  75 mcg Oral QAC breakfast  . LORazepam  0.5 mg Oral QHS  . meclizine  12.5-25 mg Oral QHS  . metoprolol tartrate  25 mg Oral BID  . multivitamin with minerals  1 tablet Oral QPC supper  . senna  1 tablet Oral Daily    PRN Meds: acetaminophen **OR** acetaminophen, albuterol, cyclobenzaprine, guaiFENesin-dextromethorphan, ondansetron **OR** ondansetron (ZOFRAN) IV, phenol, traMADol   Vital Signs    Vitals:   09/02/19 0840 09/02/19 1327 09/02/19 2134 09/03/19 0423  BP:  (!) 145/76 (!) 143/77 (!) 151/80  Pulse:  65 76 65  Resp: _0 Temp:  98 F (36.7 C) 98.4 F (36.9 C) (!) 97.5 F (36.4 C)  TempSrc:  Oral Oral Oral  SpO2:  97% 95% 96%  Weight:      Height:        Intake/Output Summary (Last 24 hours) at 09/03/2019 0914 Last data filed at 09/03/2019 0500 Gross per 24 hour  Intake 740.67 ml  Output -  Net 740.67 ml   Last 3 Weights 08/28/2019 08/28/2019 02/23/2019  Weight (lbs) 110 lb 0.2 oz 114 lb 114 lb  Weight (kg) 49.9 kg 51.71 kg 51.71 kg      Telemetry    NSR rates 60-75  Personally Reviewed  ECG    NSR rate 69 normal intervals 08/31/19  Physical Exam   GEN: No acute distress.   Neck: No JVD Cardiac: RRR, no murmurs, rubs, or gallops.  Respiratory: Clear to auscultation bilaterally. GI: Soft, nontender, non-distended  MS: No edema; No deformity. Neuro:  Nonfocal  Psych: Normal affect   Labs    High Sensitivity Troponin:   Recent Labs  Lab 08/28/19 1247 08/28/19 1447  TROPONINIHS 18* 17      Chemistry  Recent Labs  Lab 08/28/19 1315 08/29/19 0316 09/01/19 0616 09/02/19 0458 09/03/19 0455  NA  --    < > 138 136 139  K  --    < > 3.8 4.0 4.1  CL  --    < > 106 103 103  CO2  --    < > _1 GLUCOSE  --    < > 111* 102* 99  BUN  --    < > _2 CREATININE  --    < > 1.25* 1.43* 1.53*  CALCIUM  --    < > 8.6* 8.7* 8.8*  PROT 7.0  --   --   --   --   ALBUMIN 3.5  --   --   --   --   AST 27  --   --   --   --   ALT 15  --   --   --   --   ALKPHOS 90  --   --   --   --   BILITOT 1.1  --   --   --   --   GFRNONAA  --    < > 41*  35* 32*  GFRAA  --    < > 47* 40* 37*  ANIONGAP  --    < > _0 < > = values in this interval not displayed.     Hematology Recent Labs  Lab 09/01/19 0616 09/02/19 0458 09/03/19 0455  WBC 7.9 8.0 6.2  RBC 3.86* 3.55* 3.66*  HGB 11.4* 10.5* 10.8*  HCT 36.0 33.0* 34.2*  MCV 93.3 93.0 93.4  MCH 29.5 29.6 29.5  MCHC 31.7 31.8 31.6  RDW 13.6 13.4 13.3  PLT 235 238 251    BNPNo results for input(s): BNP, PROBNP in the last 168 hours.   DDimer No results for input(s): DDIMER in the last 168 hours.   Radiology    No results found.  Cardiac Studies   Echocardiogram 09/01/2019: Impressions: 1. Left ventricular ejection fraction, by estimation, is 55 to 60%. The  left ventricle has normal function. The left ventricle has no regional  wall motion abnormalities. Left ventricular diastolic parameters were  normal.  2. Right ventricular systolic function is normal. The right ventricular  size is normal. There is normal pulmonary artery systolic pressure. The  estimated right ventricular systolic pressure is 32.6 mmHg.  3. The mitral valve is normal in structure. No evidence of mitral valve  regurgitation.  4. The aortic valve is tricuspid. Aortic valve regurgitation is mild to  moderate. Mild aortic valve stenosis. Aortic valve area, by VTI measures  1.42 cm. Aortic valve mean gradient measures 11.0 mmHg. Aortic valve Vmax  measures  2.27 m/s.  5. The inferior vena cava is normal in size with greater than 50%  respiratory variability, suggesting right atrial pressure of 3 mmHg.  Patient Profile     [name] is a 79 y.o. female with a history of atrial fibrillation in 2012 after shoulder surgency not on anticoagulation due to no recurrence, bilateral carotid artery disease secondary to fibromuscular dysplasia, hypertension, hyperlipidemia, hypothyroidism, and CKD stage IIIwho is being seen today for the evaluation of atrial fibrillation with RVRat the request ofDr. Nettey.  Assessment & Plan    Paroxysmal Atrial Fibrillation with RVR  Has had PAF with long post conversion pauses now on flecainde in additon to low dose lopressor [place] VASC 5 currently off anticoagulation due to hemoptysis. Will not restart on d/c since she is in NSR Will need outpatient monitor and f/u with Dr C In future may need PPM for post conversion pauses and to resume anticoagulation    Carotid Artery Disease plaque and distal FMD on left no Rx at time   Hypertension- Well controlled.  Continue current medications and low sodium Dash type diet.    CKD Stage III - Creatinine 1.53 today.   Hemoptysis Recurrent had about a year and a half ago Post bronch with removal of large amount clot from left main bronchus. BAL negative for tumor/infection to day Per pulmonary will not restart anticoagulation at this time for at least 203 weeks   Cardiology to sign off will arrange outpatient monitor and f/u with Dr Sallyanne Kuster  For questions or updates, please contact [place] Please consult [email or web address] for contact info under        Signed, Jenkins Rouge, MD  09/03/2019, 9:14 AM

## 2019-09-03 NOTE — Telephone Encounter (Signed)
Enrolled patient for a 14 day Zio AT Telemetry monitor to be mailed to patients home. Patient will be being discharged from the hospital this weekend.

## 2019-09-03 NOTE — Progress Notes (Signed)
Patient's heart rate in 60s NSR.  Patient resting in bed.  Reports no dizzy spells.  Will continue to monitor.

## 2019-09-04 LAB — CBC
HCT: 34.1 % — ABNORMAL LOW (ref 36.0–46.0)
Hemoglobin: 10.8 g/dL — ABNORMAL LOW (ref 12.0–15.0)
MCH: 29.5 pg (ref 26.0–34.0)
MCHC: 31.7 g/dL (ref 30.0–36.0)
MCV: 93.2 fL (ref 80.0–100.0)
Platelets: 273 10*3/uL (ref 150–400)
RBC: 3.66 MIL/uL — ABNORMAL LOW (ref 3.87–5.11)
RDW: 13.2 % (ref 11.5–15.5)
WBC: 6.4 10*3/uL (ref 4.0–10.5)
nRBC: 0 % (ref 0.0–0.2)

## 2019-09-04 LAB — BASIC METABOLIC PANEL
Anion gap: 9 (ref 5–15)
BUN: 19 mg/dL (ref 8–23)
CO2: 27 mmol/L (ref 22–32)
Calcium: 8.8 mg/dL — ABNORMAL LOW (ref 8.9–10.3)
Chloride: 102 mmol/L (ref 98–111)
Creatinine, Ser: 1.38 mg/dL — ABNORMAL HIGH (ref 0.44–1.00)
GFR calc Af Amer: 42 mL/min — ABNORMAL LOW (ref >60)
GFR calc non Af Amer: 36 mL/min — ABNORMAL LOW (ref >60)
Glucose, Bld: 95 mg/dL (ref 70–99)
Potassium: 4.1 mmol/L (ref 3.5–5.1)
Sodium: 138 mmol/L (ref 135–145)

## 2019-09-04 LAB — MAGNESIUM: Magnesium: 2.2 mg/dL (ref 1.7–2.4)

## 2019-09-04 NOTE — Progress Notes (Signed)
Patient Name: Deborah Shields   Patient Profile     Deborah Shields is a26 y.o.femalewith a history of atrial fibrillation in 2012 after shoulder surgency not on anticoagulation due to no recurrence, bilateral carotid artery disease secondary to fibromuscular dysplasia, hypertension, hyperlipidemia, hypothyroidism, and CKD stage IIIwho presented with hemoptysis.  Underwent bronchoscopy finding blood in the bronchi but cytologies were negative for malignancy.  Hospital course notable for atrial fibrillation with RVRcardiology was consulted  We had signed off yesterday.  However, we are called back again because of posttermination pauses associated atrial fibrillation  Echocardiogram 3/21-normal LV function  SUBJECTIVE:*Without chest pain or shortness of breath  Past Medical History:  Diagnosis Date  . Anxiety   . Aortic insufficiency    mild due to degenerative changes  . Arthritis   . Basal cell carcinoma 2007   [facility] [facility] Left leg  . Carotid artery occlusion   . Chronic kidney disease    CRD Stage 3  . Conjunctivitis due to adenovirus, both eyes   . Degenerative joint disease   . Diverticulosis of colon   . Dyspnea    with exertion  . Heart murmur   . Hyperlipidemia    NMR 07/2009: LDL 200 (2260/1233)TG 99, HDL 65. LDL goal =<120, ideally <100. father MI @ 4  . Hypertension   . Hypothyroidism    Dr Wilson Singer  . Microscopic hematuria   . Peripheral neuropathy    compressive in UE bilaterally; Dr Daylene Katayama  . Peripheral vascular disease ([place])    ICA bilat, Dr.Charles fields, [facility]  . Pneumonia    2017  . PONV (postoperative nausea and vomiting)    "Inner ear, does  okay with Scopolamine"  . Pulmonary embolus ([place])   . Rectocele   . [place] spotted fever     Scheduled Meds:  Scheduled Meds: . acidophilus   Oral QPC breakfast  . B-complex with vitamin C  1 tablet Oral QPC supper  . Chlorhexidine Gluconate Cloth  6 each Topical Daily  .  chlorpheniramine-HYDROcodone  5 mL Oral Q12H  . flecainide  50 mg Oral Q12H  . levothyroxine  75 mcg Oral QAC breakfast  . LORazepam  0.5 mg Oral QHS  . meclizine  12.5-25 mg Oral QHS  . metoprolol tartrate  25 mg Oral BID  . multivitamin with minerals  1 tablet Oral QPC supper  . senna  1 tablet Oral Daily   Continuous Infusions: acetaminophen **OR** acetaminophen, albuterol, cyclobenzaprine, guaiFENesin-dextromethorphan, ondansetron **OR** ondansetron (ZOFRAN) IV, phenol, traMADol    PHYSICAL EXAM Vitals:   09/03/19 1100 09/03/19 1500 09/03/19 2053 09/04/19 0521  BP:  138/71 (!) 157/83 (!) 161/80  Pulse:  (!) 55 64 64  Resp: 18 16 16 16   Temp:  98.5 F (36.9 C) 98.2 F (36.8 C) 98 F (36.7 C)  TempSrc:  Oral Oral Oral  SpO2:  99% 96% 94%  Weight:      Height:        Well developed and nourished in no acute distress HENT normal Neck supple with JVP-  flat   Clear Regular rate and rhythm, no murmurs or gallops Abd-soft with active BS No Clubbing cyanosis edema Skin-warm and dry A & Oriented  Grossly normal sensory and motor function      TELEMETRY: Reviewed personnally pt in atrial tachycardia (cycle length right around 300 ms) with posttermination pauses of up to 7.7 seconds.  ECG personally reviewed 09/03/2019 atrial fibrillation  with controlled ventricular response 08/31/2019 sinus rhythm with normal intervals No intake or output data in the 24 hours ending 09/04/19 1036  LABS: Basic Metabolic Panel: Recent Labs  Lab 08/28/19 1247 08/28/19 1247 08/29/19 0316 08/29/19 0316 08/30/19 1246 08/30/19 1246 09/01/19 0616 09/01/19 0616 09/02/19 0458 09/02/19 0458 09/03/19 0455 09/04/19 0539  NA 140  --  137  --  141  --  138  --  136  --  139 138  K 3.6  --  3.6  --  3.7  --  3.8  --  4.0  --  4.1 4.1  CL 104  --  104  --  107  --  106  --  103  --  103 102  CO2 28  --  24  --  28  --  24  --  26  --  27 27  GLUCOSE 97  --  100*  --  116*  --  111*  --   102*  --  99 95  BUN 32*  --  32*  --  22  --  17  --  20  --  19 19  CREATININE 1.46*  --  1.28*  --  1.29*  --  1.25*  --  1.43*  --  1.53* 1.38*  CALCIUM 8.9   < > 8.7*   < > 8.7*   < > 8.6*   < > 8.7*   < > 8.8* 8.8*  MG  --   --   --   --  3.1*   < > 2.3   < > 2.4  --   --  2.2   < > = values in this interval not displayed.   Cardiac Enzymes: No results for input(s): CKTOTAL, CKMB, CKMBINDEX, TROPONINI in the last 72 hours. CBC: Recent Labs  Lab 08/28/19 1247 08/29/19 0316 08/30/19 0553 09/01/19 0616 09/02/19 0458 09/03/19 0455 09/04/19 0539  WBC 6.5 7.8 7.0 7.9 8.0 6.2 6.4  HGB 12.3 10.8* 11.1* 11.4* 10.5* 10.8* 10.8*  HCT 38.7 34.2* 35.1* 36.0 33.0* 34.2* 34.1*  MCV 93.3 92.7 91.6 93.3 93.0 93.4 93.2  PLT 257 220 228 235 238 251 273         ASSESSMENT AND PLAN:  Atrial fibrillation/flutter with a rapid ventricular response  Posttermination pauses up to 7.7 seconds  Hemoptysis  Hypertension  Chronic kidney disease grade 3   Recurrent atrial fibrillation with posttermination pauses.  Of sufficient duration to be associated with syncope.  Recommended pacing  Pt and daughter are agreeable.  Following pacing, more aggressive therapy for rapid rates will be appropriate  Issues of anticoagulation will be deferred to further discussion with Dr. Thermon Leyland following discharge   Signed, Virl Axe MD  09/04/2019

## 2019-09-04 NOTE — Progress Notes (Signed)
PROGRESS NOTE  Deborah Shields MWU:[id number] DOB: Aug 19, 1940 DOA: 08/28/2019 PCP: Deborah Manes, MD   LOS: 7 days   Brief narrative: As per HPI,  Deborah Shields is a 79 y.o. femalewith medical history significant ofstage III chronic kidney disease, dyslipidemia, hypertension, arthritis, and paroxysmal atrial fibrillation. Patient presented secondary to hemoptysis and found to have likely atypical pneumonia. Patient started on empiric Vancomycin, Ceftriaxone and Azithromycin. She was also found to have afib with RVR and started on a diltiazem drip which was discontinued. Patient underwent bronchoscopy on 3/8 which was significant for blood/clot and BAL performed. Patient returned to atrial fibrillation with resultant rapid ventricular response.  Assessment/Plan:  Principal Problem:   Hemoptysis Active Problems:   Hypothyroidism   HYPERLIPIDEMIA   Essential hypertension   PAF (paroxysmal atrial fibrillation) (HCC)   COPD with chronic bronchitis and emphysema (HCC)   Pneumonia   Hemoptysis with groundglass opacities. Improved.  Status post bronchoscopy with no obvious findings.  Was initially on antibiotic for atypical pneumonia which has been discontinued.  Continue to hold aspirin.  Cultures have been negative so far.  AFB culture pending..  Pulmonary has followed the patient during hospitalization and the plan is to follow-up with Deborah Shields pulmonary as outpatient in 2 to 3 weeks.  Paroxysmal atrial fibrillation/flutter with RVR, post termination pauses  After bronchoscopy.  Patient did have significant posttermination pauses yesterday.  Cardiology has seen the patient today and the plan is to consider pacemaker placement.  Spoke with Deborah Shields about it.  Currently on metoprolol and flecainide.  Will have cardiology decide on further recommendation.    2D echocardiogram showed left ventricular ejection fraction of 55 to 60% with normal wall motion.  TSH slightly low at  0.262.    Does haveCHA2DS2-VASc = 5 ( HTN, prior PE, age x2, female). Cardiology recommends against anticoagulation until at least 2 to 3 weeks due to hemoptysis and to be addressed as outpatient.  Cardiology had recommended  2 weeks Zio monitor on discharge but now the discussion is about pacemaker placement..  Hypothyroidism TSH slightly low.  Dose of Synthroid was decreased to 75 mcg daily  Essential hypertension.  Continue metoprolol.  We will continue to monitor  Anxiety -Continue Ativan prn  CKD stage IIIb Stable.  Latest creatinine of 1.3.  VTE Prophylaxis: SCD  Code Status: Full code  Family Communication: I spoke with the patient's daughter today and updated about the clinical condition of the patient and possible pacemaker placement.  Disposition Plan:  . Patient is from home . Likely disposition to home with home health when okay with cardiology.Deborah Shields in 2 to 3 days PT has recommended home health PT on discharge . Barriers to discharge: Significant pause, cardiology decision on pacemaker placement,   Face-to-face done with home health PT OT and RN.   Consultants:  Pulmonary  Cardiology  Procedures:  Bronchoscopy on 08/30/2019  Antibiotics:  . None  Anti-infectives (From admission, onward)   Start     Dose/Rate Route Frequency Ordered Stop   08/30/19 1600  azithromycin (ZITHROMAX) tablet 500 mg     500 mg Oral Daily 08/30/19 1450 08/31/19 1025   08/28/19 1800  cefTRIAXone (ROCEPHIN) 1 g in sodium chloride 0.9 % 100 mL IVPB  Status:  Discontinued     1 g 200 mL/hr over 30 Minutes Intravenous Daily 08/28/19 1728 08/30/19 1454   08/28/19 1800  azithromycin (ZITHROMAX) tablet 500 mg     500 mg Oral Daily 08/28/19 1728 08/28/19  1751   08/28/19 1615  vancomycin (VANCOCIN) IVPB 1000 mg/200 mL premix     1,000 mg 200 mL/hr over 60 Minutes Intravenous  Once 08/28/19 1601 08/28/19 1850   08/28/19 1615  cefTRIAXone (ROCEPHIN) 1 g in sodium chloride 0.9 % 100 mL  IVPB  Status:  Discontinued     1 g 200 mL/hr over 30 Minutes Intravenous  Once 08/28/19 1601 08/28/19 1735     Subjective:  Patient was seen and examined at bedside.  Denies any hemoptysis, chest pain palpitation, shortness of breath or chest pain.  She did have significant pauses yesterday.  Objective: Vitals:   09/04/19 0521 09/04/19 1252  BP: (!) 161/80 (!) 153/75  Pulse: 64 (!) 59  Resp: 16 16  Temp: 98 F (36.7 C) 98 F (36.7 C)  SpO2: 94% 100%   No intake or output data in the 24 hours ending 09/04/19 1352 Filed Weights   08/28/19 1241 08/28/19 1800  Weight: 51.7 kg 49.9 kg   Body mass index is 17.76 kg/m.   Physical Exam: General: Thinly built, not in obvious distress, alert awake and oriented.   HENT: Normocephalic, pupils equally reacting to light and accommodation.  No scleral pallor or icterus noted. Oral mucosa is moist.  Chest:  Clear breath sounds.  Diminished breath sounds bilaterally. No crackles or wheezes.  CVS: S1 &S2 heard, systolic murmur  Abdomen: Soft, nontender, nondistended.  Bowel sounds are heard.  Liver is not palpable, no abdominal mass palpated Extremities: No cyanosis, clubbing or edema.  Peripheral pulses are palpable. Psych: Alert, awake and oriented, normal mood CNS:  No cranial nerve deficits.  Power equal in all extremities.  Skin: Warm and dry.  No rashes noted.   Data Review: I have personally reviewed the following laboratory data and studies,  CBC: Recent Labs  Lab 08/30/19 0553 09/01/19 0616 09/02/19 0458 09/03/19 0455 09/04/19 0539  WBC 7.0 7.9 8.0 6.2 6.4  HGB 11.1* 11.4* 10.5* 10.8* 10.8*  HCT 35.1* 36.0 33.0* 34.2* 34.1*  MCV 91.6 93.3 93.0 93.4 93.2  PLT 228 235 238 251 891   Basic Metabolic Panel: Recent Labs  Lab 08/30/19 1246 09/01/19 0616 09/02/19 0458 09/03/19 0455 09/04/19 0539  NA 141 138 136 139 138  K 3.7 3.8 4.0 4.1 4.1  CL 107 106 103 103 102  CO2 _0 GLUCOSE 116* 111* 102* 99 95    BUN _1 CREATININE 1.29* 1.25* 1.43* 1.53* 1.38*  CALCIUM 8.7* 8.6* 8.7* 8.8* 8.8*  MG 3.1* 2.3 2.4  --  2.2   Liver Function Tests: No results for input(s): AST, ALT, ALKPHOS, BILITOT, PROT, ALBUMIN in the last 168 hours. No results for input(s): LIPASE, AMYLASE in the last 168 hours. No results for input(s): AMMONIA in the last 168 hours. Cardiac Enzymes: No results for input(s): CKTOTAL, CKMB, CKMBINDEX, TROPONINI in the last 168 hours. BNP (last 3 results) No results for input(s): BNP in the last 8760 hours.  ProBNP (last 3 results) No results for input(s): PROBNP in the last 8760 hours.  CBG: No results for input(s): GLUCAP in the last 168 hours. Recent Results (from the past 240 hour(s))  SARS CORONAVIRUS 2 (TAT 6-24 HRS) Nasopharyngeal Nasopharyngeal Swab     Status: None   Collection Time: 08/28/19  3:09 PM   Specimen: Nasopharyngeal Swab  Result Value Ref Range Status   SARS Coronavirus 2 NEGATIVE NEGATIVE Final    Comment: (NOTE) SARS-CoV-2 target  nucleic acids are NOT DETECTED. The SARS-CoV-2 RNA is generally detectable in upper and lower respiratory specimens during the acute phase of infection. Negative results do not preclude SARS-CoV-2 infection, do not rule out co-infections with other pathogens, and should not be used as the sole basis for treatment or other patient management decisions. Negative results must be combined with clinical observations, patient history, and epidemiological information. The expected result is Negative. Fact Sheet for Patients: SugarRoll.be Fact Sheet for Healthcare Providers: https://www.woods-mathews.com/ This test is not yet approved or cleared by the [place] FDA and  has been authorized for detection and/or diagnosis of SARS-CoV-2 by FDA under an Emergency Use Authorization (EUA). This EUA will remain  in effect (meaning this test can be used) for the duration of  the COVID-19 declaration under Section 56 4(b)(1) of the Act, 21 U.S.C. section 360bbb-3(b)(1), unless the authorization is terminated or revoked sooner. Performed at [facility], [place] [address] [place], [place] [address]   MRSA PCR Screening     Status: Abnormal   Collection Time: 08/28/19  5:20 PM   Specimen: Nasal Mucosa; Nasopharyngeal  Result Value Ref Range Status   MRSA by PCR POSITIVE (A) NEGATIVE Final    Comment:        The GeneXpert MRSA Assay (FDA approved for NASAL specimens only), is one component of a comprehensive MRSA colonization surveillance program. It is not intended to diagnose MRSA infection nor to guide or monitor treatment for MRSA infections. RESULT CALLED TO, READ BACK BY AND VERIFIED WITH: Susann Givens RN 1911 08/28/19 JM Performed at [facility], [place] [address], [place], [place] [address]   Acid Fast Smear (AFB)     Status: None   Collection Time: 08/28/19  5:28 PM   Specimen: Sputum  Result Value Ref Range Status   AFB Specimen Processing Concentration  Final   Acid Fast Smear Negative  Final    Comment: (NOTE) Performed At: [facility] [place], [place] [id number] Rush Farmer MD DJ:[id number]    Source (AFB) SPU  Final    Comment: Performed at [facility], [place] [address] [place], [place] [address]  Culture, respiratory     Status: None   Collection Time: 08/30/19  8:37 AM   Specimen: Bronchoalveolar Lavage; Respiratory  Result Value Ref Range Status   Specimen Description   Final    BRONCHIAL ALVEOLAR LAVAGE Performed at [place] [address] [place], [place] [address]    Special Requests   Final    NONE Performed at [facility], [place] [address], [place], [place] [address]    Gram Stain NO WBC SEEN NO ORGANISMS SEEN   Final   Culture   Final    NO GROWTH 2 DAYS Performed at [facility], [place] [address],  [place], [place] [address]    Report Status 09/01/2019 FINAL  Final  Acid Fast Smear (AFB)     Status: None   Collection Time: 08/30/19  8:37 AM   Specimen: Sputum  Result Value Ref Range Status   AFB Specimen Processing Concentration  Final   Acid Fast Smear Negative  Final    Comment: (NOTE) Performed At: [facility] [place], [place] [id number] Rush Farmer MD HF:[id number]    Source (AFB) BRONCHIAL ALVEOLAR LAVAGE  Final    Comment: Performed at [facility], [place] [address] [place], [place] [address]  Fungus Culture  With Stain     Status: Abnormal   Collection Time: 08/30/19  8:37 AM   Specimen: Sputum  Result Value Ref Range Status   Fungus Stain Final report  Final   Fungus (Mycology) Culture Preliminary report (A)  Final    Comment: (NOTE) Performed At: [facility] [place], [place] [id number] Rush Farmer MD VW:[id number]    Fungal Source BRONCHIAL ALVEOLAR LAVAGE  Final    Comment: Performed at [facility], [place] [address], [place], [place] [address]  Fungus Culture Result     Status: None   Collection Time: 08/30/19  8:37 AM  Result Value Ref Range Status   Result 1 Comment  Final    Comment: (NOTE) KOH/Calcofluor preparation:  no fungus observed. Performed At: [facility] [place], [place] [id number] Rush Farmer MD PB:[id number]   Fungal organism reflex     Status: Abnormal   Collection Time: 08/30/19  8:37 AM  Result Value Ref Range Status   Fungal result 1 Candida glabrata (A)  Final    Comment: (NOTE) 1-2 colonies                                            . Performed At: [facility] [place], [place] [id number] Rush Farmer MD GI:[id number]      Studies: No results found.    Flora Lipps, MD  [facility] 09/04/2019

## 2019-09-04 NOTE — Progress Notes (Signed)
Physical Therapy Treatment Patient Details Name: Deborah Shields MRN: [id number] DOB: 01/04/1941 Today's Date: 09/04/2019    History of Present Illness Deborah Shields is a 79 y.o. female with medical history significant of stage III chronic kidney disease, dyslipidemia, hypertension, arthritis, and paroxysmal atrial fibrillation, DVT. Patient presented secondary to hemoptysis and found to have likely atypical pneumonia. She was also found to have afib with RVR .Patient underwent bronchoscopy on 3/8 which was significant for blood/clot and BAL performed. Patient returned to atrial fibrillation with resultant rapid ventricular response.    PT Comments    Pt tolerates therapeutic exercises without increased work of breathing and good motor control. Pt with initial unsteadiness upon standing, possibly due to rising too quickly. Pt able to complete transfers and ambulation around room without unsteadiness, offered SPC but preferred to ambulate without; no reaching for furniture or loss of balance with gait in room. Pt able to complete toileting independently and returns to bed at EOS with daughter in room. Pt denies pain, dizziness, lightheadedness and RN reports no calls from tele during treatment session regarding abnormal heart rhythm. Patient will benefit from continued physical therapy in hospital and recommended venue below to increase strength, balance, endurance for safe ADLs and gait.   Follow Up Recommendations  Home health PT     Equipment Recommendations  (has all necessary equipment.)    Recommendations for Other Services       Precautions / Restrictions Precautions Precautions: Fall Precaution Comments: She has significant heart history- including paroxysmal A Fib. Restrictions Weight Bearing Restrictions: No    Mobility  Bed Mobility Overal bed mobility: Modified Independent Bed Mobility: Supine to Sit;Sit to Supine     Supine to sit: Modified independent  (Device/Increase time) Sit to supine: Modified independent (Device/Increase time)      Transfers Overall transfer level: Needs assistance Equipment used: None Transfers: Sit to/from [facility] Sit to Stand: Min guard Stand pivot transfers: Min guard       General transfer comment: slight unsteadiness with initial stand, requiring return to sitting (denies dizziness or lightheadedness); able to complete following transfers without difficulty or unsteadiness  Ambulation/Gait Ambulation/Gait assistance: Supervision Gait Distance (Feet): 40 Feet Assistive device: None Gait Pattern/deviations: Step-through pattern;Narrow base of support Gait velocity: decreased   General Gait Details: pt slowly/cautiously ambulates in room, bed<>restroom without unsteadiness and avoids reaching for furniture   Stairs             Wheelchair Mobility    Modified Rankin (Stroke Patients Only)       Balance Overall balance assessment: Needs assistance Sitting-balance support: No upper extremity supported Sitting balance-Leahy Scale: Good Sitting balance - Comments: seated EOB   Standing balance support: Single extremity supported Standing balance-Leahy Scale: Good Standing balance comment: fair/good without AD, initial undsteadiness when rises too quickly                            Cognition Arousal/Alertness: Awake/alert Behavior During Therapy: WFL for tasks assessed/performed Overall Cognitive Status: Within Functional Limits for tasks assessed                                        Exercises General Exercises - Lower Extremity Long Arc Quad: Seated;Both;15 reps Hip ABduction/ADduction: Seated;Both;15 reps Hip Flexion/Marching: Seated;Both;15 reps Toe Raises: Seated;Both;15 reps Heel Raises: Seated;Both;15 reps Mini-Sqauts: 10  reps(with UE assist from bedside chair)    General Comments General comments (skin integrity, edema,  etc.): closely monitored for balance, no signs for concern during session and RN not notified of signs for concern on EKG during treatment      Pertinent Vitals/Pain Pain Assessment: No/denies pain    Home Living                      Prior Function            PT Goals (current goals can now be found in the care plan section) Acute Rehab PT Goals Patient Stated Goal: Just want to feel better and get stronger, go home PT Goal Formulation: With patient Time For Goal Achievement: 09/15/19 Potential to Achieve Goals: Good Progress towards PT goals: Progressing toward goals    Frequency    Min 3X/week      PT Plan Current plan remains appropriate    Co-evaluation              AM-PAC PT "6 Clicks" Mobility   Outcome Measure  Help needed turning from your back to your side while in a flat bed without using bedrails?: None Help needed moving from lying on your back to sitting on the side of a flat bed without using bedrails?: None Help needed moving to and from a bed to a chair (including a wheelchair)?: A Little Help needed standing up from a chair using your arms (e.g., wheelchair or bedside chair)?: A Little Help needed to walk in hospital room?: A Little Help needed climbing 3-5 steps with a railing? : A Little 6 Click Score: 20    End of Session Equipment Utilized During Treatment: Gait belt Activity Tolerance: Patient tolerated treatment well Patient left: in bed;with call bell/phone within reach;with bed alarm set;with family/visitor present Nurse Communication: Mobility status PT Visit Diagnosis: Muscle weakness (generalized) (M62.81)     Time: 2671-2458 PT Time Calculation (min) (ACUTE ONLY): 22 min  Charges:  $Therapeutic Exercise: 8-22 mins                      Deborah Shields PT, DPT 09/04/19, 12:53 PM [phone]

## 2019-09-04 NOTE — Progress Notes (Signed)
   NAME:  Deborah Shields, MRN:  [id number], DOB:  [birth year], LOS: 7 ADMISSION DATE:  08/28/2019, CONSULTATION DATE:  08/29/19 REFERRING MD:  Lonny Prude, CHIEF COMPLAINT:  Hemoptysis   Brief History   79 year old woman w/ hx PAD p/w hemoptysis. Post bronchoscopy-cytology  History of present illness   79 year old woman w/ hx PAD p/w hemoptysis.  Fair amount estimate ~100cc's yesterday.  None this AM.  Had this issue previously, originated from lingula, cultures grew MRSA at that time.  Sputum sent for culture this time.  PCCM consulted for potential bronchoscopy.  Patient does have some unintentional weight loss in 2017 that was attributed to tickborne illness, this has since stabilized.  On baby aspirin at home.  No recent sick contacts.  No infectious symptoms.  Admits to trouble swallowing that GI has evaluated her for and neg workup including EGD.    Past Medical History  PAD Prior PE Arthritis  Significant Hospital Events   08/28/19 admitted 3/8 bronchoscopy which showed clot in lingula and LUL bronchus with granulation tissue in lingula  Consults:  PCCM  Procedures:  Bronchoscopy 3/8  Significant Diagnostic Tests:  CTA chest 3/6- lingular infiltrate Echo 3/9 > LVEF 55-60%, RVSP is normal.  No change from prior.  Micro Data:  Sputum 3/8 >  BAL 3/8 > AFB 3/8 >   Antimicrobials:  Received vanc/ceftriaxone/azithromycin in ER  Had 3 days of azithromycin 3 days of ceftriaxone 1 day of vancomycin  Interim history/subjective:  Patient did still have some hemoptysis yesterday 3/12 Only bringing up some altered blood at present No bright red blood Respiratory status is stable  Objective   Blood pressure (!) 161/80, pulse 64, temperature 98 F (36.7 C), temperature source Oral, resp. rate 16, height _0  (1.676 m), weight 49.9 kg, SpO2 94 %.       No intake or output data in the 24 hours ending 09/04/19 1005 Filed Weights   08/28/19 1241 08/28/19 1800  Weight: 51.7 kg  49.9 kg    Examination: Blood pressure 122/69, pulse 75, temperature 98.6 F (37 C), temperature source Oral, resp. rate 14, height _1  (1.676 m), weight 49.9 kg, SpO2 94 %. Gen:      Stable, does not appear to be in distress HEENT: Moist Neck:     No masses; no thyromegaly Lungs:    Clear breath sounds bilaterally, decreased at the bases CV:         Regular rate and rhythm; no murmurs  Assessment & Plan:  Hemoptysis, abnormal CT scan of the chest -Bronchoscopy brushings negative -BAL cultures negative -Bronchoscopy only significant for granulomatous changes which was brushed -Follow-up as outpatient will be appropriate from a pulmonary perspective  Paroxysmal atrial fibrillation -Flecainide -Lopressor  No other invasive intervention is needed at present Her hemoptysis will take some time to clear, only bringing up altered blood at the present time I did ask her multiple times about fresh bleeding and she stated no  Schedule follow-up with Dr. Tamala Julian in 2 to 3 weeks as outpatient  Sherrilyn Rist, MD [place], PCCM

## 2019-09-05 MED ORDER — HYDRALAZINE HCL 10 MG PO TABS
10.0000 mg | ORAL_TABLET | Freq: Three times a day (TID) | ORAL | Status: AC
  Administered 2019-09-05 – 2019-09-06 (×3): 10 mg via ORAL
  Filled 2019-09-05 (×3): qty 1

## 2019-09-05 NOTE — Progress Notes (Signed)
Physical Therapy Treatment Patient Details Name: Deborah Shields MRN: [id number] DOB: 1940/09/28 Today's Date: 09/05/2019    History of Present Illness Deborah Shields is a 79 y.o. female with medical history significant of stage III chronic kidney disease, dyslipidemia, hypertension, arthritis, and paroxysmal atrial fibrillation, DVT. Patient presented secondary to hemoptysis and found to have likely atypical pneumonia. She was also found to have afib with RVR .Patient underwent bronchoscopy on 3/8 which was significant for blood/clot and BAL performed. Patient returned to atrial fibrillation with resultant rapid ventricular response.   PT Comments    Patient resting in bed with daughter at bedside visiting- Patient was placed on NPO at 10 this morning (after she had eaten breakfast) in preparation for placement of PACEMAKER. She agreed to ex format. Performed all LE ex as outlined, with good return demo- reminders to perform in between sessions of PT. At the end of the session, there had been no updates regarding the PACEMAKER procedure. She remains very motivated and receptive to PT . Should continue to benefit from PT for optimal functional outcomes.   Follow Up Recommendations  Home health PT     Equipment Recommendations       Recommendations for Other Services       Precautions / Restrictions Precautions Precautions: Fall Precaution Comments: She has significant heart history- including paroxysmal A Fib.-> PENDING PLACEMENT OF PACEMAKER-   Was NPO on 09/05/2019, but as of 3:50 pm, daughter and patient had had no updates about the surgery. Restrictions Weight Bearing Restrictions: No    Mobility  Bed Mobility Overal bed mobility: Modified Independent Bed Mobility: Supine to Sit;Sit to Supine     Supine to sit: Modified independent (Device/Increase time) Sit to supine: Modified independent (Device/Increase time)   General bed mobility comments: min A for LEs into  bed(Did not attempt any OOB activity this session. She did agree to performing ex, but was NPO awaiting for PACEMAKER PLACEMENT.)  Transfers                    Ambulation/Gait                 Stairs             Wheelchair Mobility    Modified Rankin (Stroke Patients Only)       Balance Overall balance assessment: Needs assistance Sitting-balance support: No upper extremity supported Sitting balance-Leahy Scale: Good Sitting balance - Comments: As detailed, agreed only to ex format, as she was NPO for PACEMAKER PLACEMENT                                    Cognition Arousal/Alertness: Awake/alert Behavior During Therapy: WFL for tasks assessed/performed Overall Cognitive Status: Within Functional Limits for tasks assessed                                 General Comments: She was placed on NPO after she had eaten breakfast- with the understanding that she was to have a PACEMAKER placement today. As of 3:40, she had had no updates about the procedure- daughter at bedside.      Exercises General Exercises - Lower Extremity Ankle Circles/Pumps: AROM;Supine;10 reps Quad Sets: AROM;Both;10 reps;Supine Gluteal Sets: AROM;Supine;10 reps Short Arc Quad: AROM;10 reps;Supine Heel Slides: AROM;Supine;10 reps Hip ABduction/ADduction: AROM;Supine;10 reps Straight Leg Raises: AAROM;Supine;10 reps  Other Exercises Other Exercises: Reminded to perform HEP at least 2 x daily, in between PT sessions . She is very motivated and receptive to PT- Daughter very supportive and also receptive.    General Comments        Pertinent Vitals/Pain Pain Assessment: No/denies pain    Home Living                      Prior Function            PT Goals (current goals can now be found in the care plan section) Acute Rehab PT Goals Patient Stated Goal: Just want to feel better and get stronger, go home PT Goal Formulation: With  patient Time For Goal Achievement: 09/15/19 Potential to Achieve Goals: Good Progress towards PT goals: Progressing toward goals    Frequency    Min 3X/week      PT Plan Current plan remains appropriate    Co-evaluation              AM-PAC PT "6 Clicks" Mobility   Outcome Measure  Help needed turning from your back to your side while in a flat bed without using bedrails?: None   Help needed moving to and from a bed to a chair (including a wheelchair)?: A Little Help needed standing up from a chair using your arms (e.g., wheelchair or bedside chair)?: A Little Help needed to walk in hospital room?: A Little Help needed climbing 3-5 steps with a railing? : A Little 6 Click Score: 16    End of Session   Activity Tolerance: Patient tolerated treatment well Patient left: in bed;with call bell/phone within reach;with bed alarm set;with family/visitor present Nurse Communication: Mobility status PT Visit Diagnosis: Muscle weakness (generalized) (M62.81)     Time: 5027-7412 PT Time Calculation (min) (ACUTE ONLY): 29 min  Charges:  $Therapeutic Exercise: 23-37 mins                   Rollen Sox, PT # [phone] CGV cell  Casandra Doffing 09/05/2019, 4:13 PM

## 2019-09-05 NOTE — Progress Notes (Signed)
Orders written this AM for patient to be NPO after 10:00. Tried having Dr. Caryl Comes paged x2, no response. Called Dr Louanne Belton to see if he knew if patient was having a Pacemaker put in today at [facility], he thought it would be Monday or Tuesday. I explained the orders were written for 09-05-19. A PA called me and I explained the orders were dated for today, she said she would get back to me or post new orders. Neither was done. I took the patient off NPO around 16:00. Charge nurse aware.

## 2019-09-05 NOTE — Progress Notes (Signed)
OT Cancellation Note  Patient Details Name: Deborah Shields MRN: [id number] DOB: 11/16/1940   Cancelled Treatment:    Reason Eval/Treat Not Completed: Other (comment).  Pt was very willing to work with OT; however, breakfast had just arrived when I first checked on her, and she was fatiqued and back to bed when I returned. Will try again tomorrow, as schedule permits.  [place] 09/05/2019, 1:02 PM  Karsten Ro, OTR/L Acute Rehabilitation Services 09/05/2019

## 2019-09-05 NOTE — Progress Notes (Signed)
OT Cancellation Note  Patient Details Name: Deborah Shields MRN: [id number] DOB: 01-Jul-1940   Cancelled Treatment:    Reason Eval/Treat Not Completed: Other (comment).  Pt is eating breakfast.  If schedule permits, I will check back later.  [place] 09/05/2019, 10:21 AM  Karsten Ro, OTR/L Acute Rehabilitation Services 09/05/2019

## 2019-09-05 NOTE — Progress Notes (Signed)
Ensure patient is scheduled to see Dr. Tamala Julian in 2 to 3 weeks post discharge  Candida glabrata that was cultured from BAL does not need to be treated, patient is not immunocompromised, does not have candidemia  Pulmonary will sign off

## 2019-09-05 NOTE — Progress Notes (Signed)
Patient Name: Deborah Shields   Patient Profile     Ms. Belzer is a1 y.o.femalewith a history of atrial fibrillation in 2012 after shoulder surgency not on anticoagulation due to no recurrence, bilateral carotid artery disease secondary to fibromuscular dysplasia, hypertension, hyperlipidemia, hypothyroidism, and CKD stage IIIwho presented with hemoptysis.  Underwent bronchoscopy finding blood in the bronchi but cytologies were negative for malignancy.  Hospital course notable for atrial fibrillation with RVRcardiology was consulted  We had signed off yesterday.  However, we are called back again because of posttermination pauses associated atrial fibrillation  Pacing anticipated 3/15 or 16  Echocardiogram 3/21-normal LV function  SUBJECTIVE: Without complaint, no chest pain or shortness of breath Voices good understanding of plan  Past Medical History:  Diagnosis Date  . Anxiety   . Aortic insufficiency    mild due to degenerative changes  . Arthritis   . Basal cell carcinoma 2007   [facility] Left leg  . Carotid artery occlusion   . Chronic kidney disease    CRD Stage 3  . Conjunctivitis due to adenovirus, both eyes   . Degenerative joint disease   . Diverticulosis of colon   . Dyspnea    with exertion  . Heart murmur   . Hyperlipidemia    NMR 07/2009: LDL 200 (2260/1233)TG 99, HDL 65. LDL goal =<120, ideally <100. father MI @ 62  . Hypertension   . Hypothyroidism    Dr Wilson Singer  . Microscopic hematuria   . Peripheral neuropathy    compressive in UE bilaterally; Dr Daylene Katayama  . Peripheral vascular disease ([place])    ICA bilat, Dr.Charles fields, [facility]  . Pneumonia    2017  . PONV (postoperative nausea and vomiting)    "Inner ear, does  okay with Scopolamine"  . Pulmonary embolus ([place])   . Rectocele   . [place] spotted fever     Scheduled Meds:  Scheduled Meds: . acidophilus   Oral QPC breakfast  . B-complex with vitamin C  1 tablet  Oral QPC supper  . Chlorhexidine Gluconate Cloth  6 each Topical Daily  . chlorpheniramine-HYDROcodone  5 mL Oral Q12H  . flecainide  50 mg Oral Q12H  . levothyroxine  75 mcg Oral QAC breakfast  . LORazepam  0.5 mg Oral QHS  . meclizine  12.5-25 mg Oral QHS  . metoprolol tartrate  25 mg Oral BID  . multivitamin with minerals  1 tablet Oral QPC supper  . senna  1 tablet Oral Daily   Continuous Infusions: acetaminophen **OR** acetaminophen, albuterol, cyclobenzaprine, guaiFENesin-dextromethorphan, ondansetron **OR** ondansetron (ZOFRAN) IV, phenol, traMADol    PHYSICAL EXAM Vitals:   09/04/19 0521 09/04/19 1252 09/04/19 2310 09/05/19 0513  BP: (!) 161/80 (!) 153/75  (!) 167/72  Pulse: 64 (!) 59 81 (!) 58  Resp: 16 16  16   Temp: 98 F (36.7 C) 98 F (36.7 C)  98 F (36.7 C)  TempSrc: Oral Oral  Oral  SpO2: 94% 100%  94%  Weight:      Height:        Well developed and nourished in no acute distress HENT normal Neck supple with JVP- 8-10 Clear Regular rate and rhythm, 2/6 murmur  Abd-soft with active BS No Clubbing cyanosis edema Skin-warm and dry A & Oriented  Grossly normal sensory and motor function  Telemetry Personally reviewed sinus without interval afib  ECG personally reviewed 09/03/2019 atrial fibrillation with controlled ventricular response 08/31/2019 sinus  rhythm with normal intervals No intake or output data in the 24 hours ending 09/05/19 0821  LABS: Basic Metabolic Panel: Recent Labs  Lab 08/30/19 1246 08/30/19 1246 09/01/19 0616 09/01/19 0616 09/02/19 0458 09/02/19 0458 09/03/19 0455 09/04/19 0539  NA 141  --  138  --  136  --  139 138  K 3.7  --  3.8  --  4.0  --  4.1 4.1  CL 107  --  106  --  103  --  103 102  CO2 28  --  24  --  26  --  27 27  GLUCOSE 116*  --  111*  --  102*  --  99 95  BUN 22  --  17  --  20  --  19 19  CREATININE 1.29*  --  1.25*  --  1.43*  --  1.53* 1.38*  CALCIUM 8.7*   < > 8.6*   < > 8.7*   < > 8.8* 8.8*  MG 3.1*    < > 2.3   < > 2.4  --   --  2.2   < > = values in this interval not displayed.   Cardiac Enzymes: No results for input(s): CKTOTAL, CKMB, CKMBINDEX, TROPONINI in the last 72 hours. CBC: Recent Labs  Lab 08/30/19 0553 09/01/19 0616 09/02/19 0458 09/03/19 0455 09/04/19 0539  WBC 7.0 7.9 8.0 6.2 6.4  HGB 11.1* 11.4* 10.5* 10.8* 10.8*  HCT 35.1* 36.0 33.0* 34.2* 34.1*  MCV 91.6 93.3 93.0 93.4 93.2  PLT 228 235 238 251 273         ASSESSMENT AND PLAN:  Atrial fibrillation/flutter with a rapid ventricular response  Posttermination pauses up to 7.7 seconds  Hemoptysis  Hypertension  Chronic kidney disease grade 3  Pacing anticipated as schedule allows 3/15  Orders written  BP poorly controlled would increase BB following pacemaker implant for both BP and HR control  For today will give hydralazine    Issues of anticoagulation will be deferred to further discussion with Dr. Thermon Leyland following discharge   Signed, Virl Axe MD  09/05/2019

## 2019-09-05 NOTE — Progress Notes (Signed)
Pt switched into Afib rhythm a couple times with rate not sustaining above 120 and tele notified of 2.5 second pause. Pt also had a 4-beat run of vtach. Pt now back in NSR. Notified MD Humphrey Rolls. Will continue to monitor.

## 2019-09-05 NOTE — Progress Notes (Signed)
PCR sent to lab for MRSA and STaph

## 2019-09-05 NOTE — Progress Notes (Signed)
PROGRESS NOTE  Deborah Shields IRW:[id number] DOB: 1941-06-16 DOA: 08/28/2019 PCP: Lajean Manes, MD   LOS: 8 days   Brief narrative: As per HPI,  Deborah Shields is a 79 y.o. femalewith medical history significant ofstage III chronic kidney disease, dyslipidemia, hypertension, arthritis, and paroxysmal atrial fibrillation. Patient presented secondary to hemoptysis and found to have likely atypical pneumonia. Patient started on empiric Vancomycin, Ceftriaxone and Azithromycin. She was also found to have afib with RVR and started on a diltiazem drip which was discontinued. Patient underwent bronchoscopy on 3/8 which was significant for blood/clot and BAL performed. Patient returned to atrial fibrillation with resultant rapid ventricular response.  Patient was then admitted to the hospital for further evaluation and treatment.  Assessment/Plan:  Principal Problem:   Hemoptysis Active Problems:   Hypothyroidism   HYPERLIPIDEMIA   Essential hypertension   PAF (paroxysmal atrial fibrillation) (HCC)   COPD with chronic bronchitis and emphysema (HCC)   Pneumonia   Hemoptysis with groundglass opacities. Improved.  Status post bronchoscopy with no obvious findings.  Was initially on antibiotic for atypical pneumonia which has been discontinued.  Continue to hold aspirin.  Cultures have been negative so far.  AFB culture pending..  Pulmonary has followed the patient during hospitalization and the plan is to follow-up with Dr. Tamala Julian pulmonary as outpatient in 2 to 3 weeks.  Bronchoalveolar lavage showing some Candida glabrata.  Spoke with the pulmonary Dr Ander Slade and he does not indicate any antifungals at this time.  Paroxysmal atrial fibrillation/flutter with RVR, post termination pauses  After bronchoscopy.  Patient with posttermination pauses.  Cardiology has seen the patient today and the plan is pacemaker placement.    Currently on metoprolol and flecainide.   2D echocardiogram  showed left ventricular ejection fraction of 55 to 60% with normal wall motion.  TSH slightly low at 0.262.    Does haveCHA2DS2-VASc = 5 ( HTN, prior PE, age x2, female). Cardiology recommends against anticoagulation until at least 2 to 3 weeks due to hemoptysis and to be addressed as outpatient.  Cardiology had recommended  2 weeks Zio monitor on discharge but now the discussion is about pacemaker placement.  Communicated with Dr. Caryl Comes again today indicated that we will keep the the patient at [facility] until we have a definite slot at [facility]..  Hypothyroidism TSH slightly low.  Dose of Synthroid was decreased to 75 mcg daily  Essential hypertension.  Continue metoprolol.  We will continue to monitor  Anxiety -Continue Ativan prn  CKD stage IIIb Stable.  Latest creatinine of 1.3.  VTE Prophylaxis: SCD  Code Status: Full code  Family Communication: None today.  I spoke with the patient's daughter yesterday.  Disposition Plan:  . Patient is from home . Likely disposition to home with home health when okay with cardiology likely in 2 to 3 days. PT has recommended home health PT on discharge . Barriers to discharge: Significant posttermination pause, cardiology planning on pacemaker placement,   Face-to-face done with home health PT OT and RN.   Consultants:  Pulmonary   Cardiology  Procedures:  Bronchoscopy on 08/30/2019  Antibiotics: . None  Anti-infectives (From admission, onward)   Start     Dose/Rate Route Frequency Ordered Stop   08/30/19 1600  azithromycin (ZITHROMAX) tablet 500 mg     500 mg Oral Daily 08/30/19 1450 08/31/19 1025   08/28/19 1800  cefTRIAXone (ROCEPHIN) 1 g in sodium chloride 0.9 % 100 mL IVPB  Status:  Discontinued  1 g 200 mL/hr over 30 Minutes Intravenous Daily 08/28/19 1728 08/30/19 1454   08/28/19 1800  azithromycin (ZITHROMAX) tablet 500 mg     500 mg Oral Daily 08/28/19 1728 08/28/19 1751   08/28/19 1615  vancomycin (VANCOCIN) IVPB  1000 mg/200 mL premix     1,000 mg 200 mL/hr over 60 Minutes Intravenous  Once 08/28/19 1601 08/28/19 1850   08/28/19 1615  cefTRIAXone (ROCEPHIN) 1 g in sodium chloride 0.9 % 100 mL IVPB  Status:  Discontinued     1 g 200 mL/hr over 30 Minutes Intravenous  Once 08/28/19 1601 08/28/19 1735     Subjective:  Patient was seen and examined at bedside.  Denies any chest pain, or shortness of breath.  She indicated that she had one very tiny spot of hemoptysis yesterday evening.  Objective: Vitals:   09/05/19 1037 09/05/19 1056  BP: (!) 141/66 (!) 141/66  Pulse: 70   Resp:    Temp:    SpO2:     No intake or output data in the 24 hours ending 09/05/19 1136 Filed Weights   08/28/19 1241 08/28/19 1800  Weight: 51.7 kg 49.9 kg   Body mass index is 17.76 kg/m.   Physical Exam:  General: Thinly built, not in obvious distress, alert awake and oriented.   HENT: Normocephalic, pupils equally reacting to light and accommodation.  No scleral pallor or icterus noted. Oral mucosa is moist.  Chest:  Clear breath sounds.  Diminished breath sounds bilaterally. No crackles or wheezes.  CVS: S1 &S2 heard, systolic murmur  Abdomen: Soft, nontender, nondistended.  Bowel sounds are heard.  Extremities: No cyanosis, clubbing or edema.  Peripheral pulses are palpable. Psych: Alert, awake and oriented, normal mood CNS:  No cranial nerve deficits.  Power equal in all extremities.  Skin: Warm and dry.  No rashes noted.   Data Review: I have personally reviewed the following laboratory data and studies,  CBC: Recent Labs  Lab 08/30/19 0553 09/01/19 0616 09/02/19 0458 09/03/19 0455 09/04/19 0539  WBC 7.0 7.9 8.0 6.2 6.4  HGB 11.1* 11.4* 10.5* 10.8* 10.8*  HCT 35.1* 36.0 33.0* 34.2* 34.1*  MCV 91.6 93.3 93.0 93.4 93.2  PLT 228 235 238 251 754   Basic Metabolic Panel: Recent Labs  Lab 08/30/19 1246 09/01/19 0616 09/02/19 0458 09/03/19 0455 09/04/19 0539  NA 141 138 136 139 138  K 3.7  3.8 4.0 4.1 4.1  CL 107 106 103 103 102  CO2 _0 GLUCOSE 116* 111* 102* 99 95  BUN _1 CREATININE 1.29* 1.25* 1.43* 1.53* 1.38*  CALCIUM 8.7* 8.6* 8.7* 8.8* 8.8*  MG 3.1* 2.3 2.4  --  2.2   Liver Function Tests: No results for input(s): AST, ALT, ALKPHOS, BILITOT, PROT, ALBUMIN in the last 168 hours. No results for input(s): LIPASE, AMYLASE in the last 168 hours. No results for input(s): AMMONIA in the last 168 hours. Cardiac Enzymes: No results for input(s): CKTOTAL, CKMB, CKMBINDEX, TROPONINI in the last 168 hours. BNP (last 3 results) No results for input(s): BNP in the last 8760 hours.  ProBNP (last 3 results) No results for input(s): PROBNP in the last 8760 hours.  CBG: No results for input(s): GLUCAP in the last 168 hours. Recent Results (from the past 240 hour(s))  SARS CORONAVIRUS 2 (TAT 6-24 HRS) Nasopharyngeal Nasopharyngeal Swab     Status: None   Collection Time: 08/28/19  3:09 PM   Specimen: Nasopharyngeal  Swab  Result Value Ref Range Status   SARS Coronavirus 2 NEGATIVE NEGATIVE Final    Comment: (NOTE) SARS-CoV-2 target nucleic acids are NOT DETECTED. The SARS-CoV-2 RNA is generally detectable in upper and lower respiratory specimens during the acute phase of infection. Negative results do not preclude SARS-CoV-2 infection, do not rule out co-infections with other pathogens, and should not be used as the sole basis for treatment or other patient management decisions. Negative results must be combined with clinical observations, patient history, and epidemiological information. The expected result is Negative. Fact Sheet for Patients: SugarRoll.be Fact Sheet for Healthcare Providers: https://www.woods-mathews.com/ This test is not yet approved or cleared by the [place] FDA and  has been authorized for detection and/or diagnosis of SARS-CoV-2 by FDA under an Emergency Use Authorization  (EUA). This EUA will remain  in effect (meaning this test can be used) for the duration of the COVID-19 declaration under Section 56 4(b)(1) of the Act, 21 U.S.C. section 360bbb-3(b)(1), unless the authorization is terminated or revoked sooner. Performed at [facility], [place] [address] [place], [place] [address]   MRSA PCR Screening     Status: Abnormal   Collection Time: 08/28/19  5:20 PM   Specimen: Nasal Mucosa; Nasopharyngeal  Result Value Ref Range Status   MRSA by PCR POSITIVE (A) NEGATIVE Final    Comment:        The GeneXpert MRSA Assay (FDA approved for NASAL specimens only), is one component of a comprehensive MRSA colonization surveillance program. It is not intended to diagnose MRSA infection nor to guide or monitor treatment for MRSA infections. RESULT CALLED TO, READ BACK BY AND VERIFIED WITH: Susann Givens RN 1911 08/28/19 JM Performed at [facility], [place] [address], [place], [place] [address]   Acid Fast Smear (AFB)     Status: None   Collection Time: 08/28/19  5:28 PM   Specimen: Sputum  Result Value Ref Range Status   AFB Specimen Processing Concentration  Final   Acid Fast Smear Negative  Final    Comment: (NOTE) Performed At: [facility] [place], [place] [id number] Rush Farmer MD [id number]    Source (AFB) SPU  Final    Comment: Performed at [facility], [place] [address], [place], [place] [address]  Culture, respiratory     Status: None   Collection Time: 08/30/19  8:37 AM   Specimen: Bronchoalveolar Lavage; Respiratory  Result Value Ref Range Status   Specimen Description   Final    BRONCHIAL ALVEOLAR LAVAGE Performed at [place] [address], [place], [place] [address]    Special Requests   Final    NONE Performed at [facility], [place] [address] [place], [place] [address]    Gram Stain NO WBC SEEN NO ORGANISMS SEEN   Final    Culture   Final    NO GROWTH 2 DAYS Performed at [facility], [place] [address], [place], [place] [address]    Report Status 09/01/2019 FINAL  Final  Acid Fast Smear (AFB)     Status: None   Collection Time: 08/30/19  8:37 AM   Specimen: Sputum  Result Value Ref Range Status   AFB Specimen Processing Concentration  Final   Acid Fast Smear Negative  Final    Comment: (NOTE) Performed At: [facility] [place], [place] [id number] Rush Farmer MD [id number]    Source (AFB) BRONCHIAL ALVEOLAR LAVAGE  Final    Comment: Performed at [facility], [place] [address], [place], [place] [address]  Fungus Culture With Stain     Status: Abnormal   Collection Time: 08/30/19  8:37 AM   Specimen: Sputum  Result Value Ref Range Status   Fungus Stain Final report  Final   Fungus (Mycology) Culture Preliminary report (A)  Final    Comment: (NOTE) Performed At: [facility] [place], [place] [id number] Rush Farmer MD [id number]    Fungal Source BRONCHIAL ALVEOLAR LAVAGE  Final    Comment: Performed at [facility], [place] [address], [place], [place] [address]  Fungus Culture Result     Status: None   Collection Time: 08/30/19  8:37 AM  Result Value Ref Range Status   Result 1 Comment  Final    Comment: (NOTE) KOH/Calcofluor preparation:  no fungus observed. Performed At: [facility] [place], [place] [id number] Rush Farmer MD [id number]   Fungal organism reflex     Status: Abnormal   Collection Time: 08/30/19  8:37 AM  Result Value Ref Range Status   Fungal result 1 Candida glabrata (A)  Final    Comment: (NOTE) 1-2 colonies                                            . Performed At: [facility] [place], [place] [id number] Rush Farmer MD [id number]      Studies: No results found.    Flora Lipps, MD  [facility]  09/05/2019

## 2019-09-06 ENCOUNTER — Encounter (HOSPITAL_COMMUNITY)
Admission: EM | Disposition: A | Discharge status: Home Health | Payer: Self-pay | Source: Home / Self Care | Attending: Internal Medicine

## 2019-09-06 DIAGNOSIS — I495 Sick sinus syndrome: Secondary | ICD-10-CM | POA: Diagnosis not present

## 2019-09-06 HISTORY — PX: PACEMAKER IMPLANT: EP1218

## 2019-09-06 LAB — CBC
HCT: 36.3 % (ref 36.0–46.0)
Hemoglobin: 11.8 g/dL — ABNORMAL LOW (ref 12.0–15.0)
MCH: 30.1 pg (ref 26.0–34.0)
MCHC: 32.5 g/dL (ref 30.0–36.0)
MCV: 92.6 fL (ref 80.0–100.0)
Platelets: 305 10*3/uL (ref 150–400)
RBC: 3.92 MIL/uL (ref 3.87–5.11)
RDW: 13.2 % (ref 11.5–15.5)
WBC: 8 10*3/uL (ref 4.0–10.5)
nRBC: 0 % (ref 0.0–0.2)

## 2019-09-06 LAB — BASIC METABOLIC PANEL
Anion gap: 10 (ref 5–15)
BUN: 21 mg/dL (ref 8–23)
CO2: 25 mmol/L (ref 22–32)
Calcium: 8.9 mg/dL (ref 8.9–10.3)
Chloride: 102 mmol/L (ref 98–111)
Creatinine, Ser: 1.52 mg/dL — ABNORMAL HIGH (ref 0.44–1.00)
GFR calc Af Amer: 37 mL/min — ABNORMAL LOW (ref >60)
GFR calc non Af Amer: 32 mL/min — ABNORMAL LOW (ref >60)
Glucose, Bld: 112 mg/dL — ABNORMAL HIGH (ref 70–99)
Potassium: 4.5 mmol/L (ref 3.5–5.1)
Sodium: 137 mmol/L (ref 135–145)

## 2019-09-06 LAB — MAGNESIUM: Magnesium: 2.3 mg/dL (ref 1.7–2.4)

## 2019-09-06 SURGERY — PACEMAKER IMPLANT

## 2019-09-06 MED ORDER — METOPROLOL TARTRATE 5 MG/5ML IV SOLN
INTRAVENOUS | Status: DC | PRN
  Administered 2019-09-06: 5 mg via INTRAVENOUS

## 2019-09-06 MED ORDER — SODIUM CHLORIDE 0.9 % IV SOLN: INTRAVENOUS | Status: DC

## 2019-09-06 MED ORDER — SODIUM CHLORIDE 0.9 % IV SOLN
INTRAVENOUS | Status: DC | PRN
  Administered 2019-09-06 – 2019-09-07 (×2): 1000 mL via INTRAVENOUS

## 2019-09-06 MED ORDER — FENTANYL CITRATE (PF) 100 MCG/2ML IJ SOLN
INTRAMUSCULAR | Status: AC
  Filled 2019-09-06: qty 2

## 2019-09-06 MED ORDER — METOPROLOL TARTRATE 5 MG/5ML IV SOLN
INTRAVENOUS | Status: AC
  Filled 2019-09-06: qty 5

## 2019-09-06 MED ORDER — ONDANSETRON HCL 4 MG/2ML IJ SOLN: 4.0000 mg | Freq: Four times a day (QID) | INTRAMUSCULAR | Status: DC | PRN

## 2019-09-06 MED ORDER — METOPROLOL SUCCINATE ER 25 MG PO TB24
25.0000 mg | ORAL_TABLET | Freq: Every day | ORAL | Status: DC
  Administered 2019-09-06 – 2019-09-07 (×2): 25 mg via ORAL
  Filled 2019-09-06 (×3): qty 1

## 2019-09-06 MED ORDER — CEFAZOLIN SODIUM-DEXTROSE 1-4 GM/50ML-% IV SOLN
1.0000 g | Freq: Four times a day (QID) | INTRAVENOUS | Status: AC
  Administered 2019-09-06 – 2019-09-07 (×3): 1 g via INTRAVENOUS
  Filled 2019-09-06 (×4): qty 50

## 2019-09-06 MED ORDER — FENTANYL CITRATE (PF) 100 MCG/2ML IJ SOLN
INTRAMUSCULAR | Status: DC | PRN
  Administered 2019-09-06: 12.5 ug via INTRAVENOUS

## 2019-09-06 MED ORDER — MUPIROCIN 2 % EX OINT
TOPICAL_OINTMENT | Freq: Two times a day (BID) | CUTANEOUS | Status: DC
  Filled 2019-09-06 (×2): qty 22

## 2019-09-06 MED ORDER — ACETAMINOPHEN 325 MG PO TABS
325.0000 mg | ORAL_TABLET | ORAL | Status: DC | PRN
  Filled 2019-09-06: qty 2

## 2019-09-06 MED ORDER — LIDOCAINE HCL (PF) 1 % IJ SOLN
INTRAMUSCULAR | Status: DC | PRN
  Administered 2019-09-06: 60 mL

## 2019-09-06 MED ORDER — VITAMIN D 25 MCG (1000 UNIT) PO TABS
1000.0000 [IU] | ORAL_TABLET | Freq: Every day | ORAL | Status: DC
  Administered 2019-09-06 – 2019-09-07 (×2): 1000 [IU] via ORAL
  Filled 2019-09-06 (×2): qty 1

## 2019-09-06 MED ORDER — IOHEXOL 350 MG/ML SOLN
INTRAVENOUS | Status: DC | PRN
  Administered 2019-09-06: 15 mL via INTRAVENOUS
  Administered 2019-09-06: 10 mL

## 2019-09-06 MED ORDER — MIDAZOLAM HCL 5 MG/5ML IJ SOLN
INTRAMUSCULAR | Status: DC | PRN
  Administered 2019-09-06: 1 mg via INTRAVENOUS

## 2019-09-06 MED ORDER — CEFAZOLIN SODIUM-DEXTROSE 2-4 GM/100ML-% IV SOLN
2.0000 g | INTRAVENOUS | Status: AC
  Administered 2019-09-06: 2 g via INTRAVENOUS
  Filled 2019-09-06: qty 100

## 2019-09-06 MED ORDER — HYDRALAZINE HCL 20 MG/ML IJ SOLN
INTRAMUSCULAR | Status: DC | PRN
  Administered 2019-09-06: 5 mg via INTRAVENOUS

## 2019-09-06 MED ORDER — SODIUM CHLORIDE 0.9 % IV SOLN
80.0000 mg | INTRAVENOUS | Status: AC
  Administered 2019-09-06: 80 mg
  Filled 2019-09-06: qty 2

## 2019-09-06 MED ORDER — SODIUM CHLORIDE 0.9 % IV SOLN
INTRAVENOUS | Status: DC
  Administered 2019-09-06: 50 mL/h via INTRAVENOUS

## 2019-09-06 MED ORDER — MIDAZOLAM HCL 5 MG/5ML IJ SOLN
INTRAMUSCULAR | Status: AC
  Filled 2019-09-06: qty 5

## 2019-09-06 MED ORDER — HYDRALAZINE HCL 20 MG/ML IJ SOLN
INTRAMUSCULAR | Status: AC
  Filled 2019-09-06: qty 1

## 2019-09-06 SURGICAL SUPPLY — 8 items
CABLE SURGICAL S-101-97-12 (CABLE) ×2 IMPLANT
GUIDEWIRE ANGLED .035X150CM (WIRE) ×1 IMPLANT
LEAD TENDRIL MRI 46CM LPA1200M (Lead) ×1 IMPLANT
LEAD TENDRIL MRI 52CM LPA1200M (Lead) ×1 IMPLANT
PACEMAKER ASSURITY DR-RF (Pacemaker) ×1 IMPLANT
PAD PRO RADIOLUCENT 2001M-C (PAD) ×2 IMPLANT
SHEATH 8FR PRELUDE SNAP 13 (SHEATH) ×2 IMPLANT
TRAY PACEMAKER INSERTION (PACKS) ×2 IMPLANT

## 2019-09-06 NOTE — Progress Notes (Signed)
L anterior chest dressing intact; no bleeding noted to dressing; c/o pain in "arm and chest"- "5/10" - call to pharmacy for tramadol.

## 2019-09-06 NOTE — Progress Notes (Signed)
Transport arrived at 0740 to take pt. To [facility] for procedure. Explain this to pt. Pt. In agreement to go. Pt. Able to walk to stretcher. All belong gather by nurse and sent via transport. Consent signed by pt. witnessed by nurse. Daughter notified.

## 2019-09-06 NOTE — Progress Notes (Signed)
PROGRESS NOTE  Deborah Shields HQP:[id number] DOB: 01/17/1941 DOA: 08/28/2019 PCP: Lajean Manes, MD   LOS: 9 days   Brief narrative: As per HPI,  Deborah Shields is a 79 y.o. femalewith medical history significant ofstage III chronic kidney disease, dyslipidemia, hypertension, arthritis, and paroxysmal atrial fibrillation. Patient presented secondary to hemoptysis and found to have likely atypical pneumonia. Patient started on empiric Vancomycin, Ceftriaxone and Azithromycin. She was also found to have afib with RVR and started on a diltiazem drip which was discontinued. Patient underwent bronchoscopy on 3/8 which was significant for blood/clot and BAL performed. Patient returned to atrial fibrillation with resultant rapid ventricular response.  Patient was then admitted to the hospital for further evaluation and treatment.  Assessment/Plan:  Principal Problem:   Hemoptysis Active Problems:   Hypothyroidism   HYPERLIPIDEMIA   Essential hypertension   PAF (paroxysmal atrial fibrillation) (HCC)   COPD with chronic bronchitis and emphysema (HCC)   Pneumonia   Hemoptysis with groundglass opacities. Improved.  Status post bronchoscopy with no obvious findings.  Was initially on antibiotic for atypical pneumonia which has been discontinued.  Continue to hold aspirin.  Cultures have been negative so far.  AFB culture pending..  Pulmonary has followed the patient during hospitalization and the plan is to follow-up with Dr. Tamala Julian pulmonary as outpatient in 2 to 3 weeks.  Bronchoalveolar lavage showing some Candida glabrata.  Spoke with the pulmonary Dr Ander Slade and he does not indicate any antifungals at this time.  Paroxysmal atrial fibrillation/flutter with RVR, post termination pauses  After bronchoscopy.  Patient with posttermination pauses.  Cardiology has seen the patient and the plan is pacemaker placement today. [facility].    Currently, on metoprolol and flecainide.   2D echocardiogram showed left ventricular ejection fraction of 55 to 60% with normal wall motion.  TSH slightly low at 0.262.    CHA2DS2-VASc = 5 ( HTN, prior PE, age x2, female). Cardiology recommends against anticoagulation until at least 2 to 3 weeks due to hemoptysis and to be addressed as outpatient.  Cardiology had recommended  2 weeks Zio monitor on discharge but now the discussion is about pacemaker placement.   Hypothyroidism TSH slightly low.  Dose of Synthroid was decreased to 75 mcg daily  Essential hypertension.  Continue metoprolol.  We will continue to monitor  Anxiety -Continue Ativan prn  CKD stage IIIb Stable.  Latest creatinine of 1.3.  VTE Prophylaxis: SCD  Code Status: Full code  Family Communication: None today.   Disposition Plan:  . Patient is from home . Likely disposition to home with home health when okay with cardiology likely in 2 to 3 days. PT has recommended home health PT on discharge . Barriers to discharge: Significant posttermination pause, cardiology planning on pacemaker placement today,   Face-to-face done with home health PT OT and RN.   Consultants:  Pulmonary   Cardiology  Procedures:  Bronchoscopy on 08/30/2019  Antibiotics: . None  Anti-infectives (From admission, onward)   Start     Dose/Rate Route Frequency Ordered Stop   09/06/19 0515  gentamicin (GARAMYCIN) 80 mg in sodium chloride 0.9 % 500 mL irrigation     80 mg Irrigation On call 09/06/19 0503 09/06/19 0835   09/06/19 0515  ceFAZolin (ANCEF) IVPB 2g/100 mL premix     2 g 200 mL/hr over 30 Minutes Intravenous On call 09/06/19 0503 09/06/19 0910   08/30/19 1600  azithromycin (ZITHROMAX) tablet 500 mg  500 mg Oral Daily 08/30/19 1450 08/31/19 1025   08/28/19 1800  cefTRIAXone (ROCEPHIN) 1 g in sodium chloride 0.9 % 100 mL IVPB  Status:  Discontinued     1 g 200 mL/hr over 30 Minutes Intravenous Daily 08/28/19 1728 08/30/19  1454   08/28/19 1800  azithromycin (ZITHROMAX) tablet 500 mg     500 mg Oral Daily 08/28/19 1728 08/28/19 1751   08/28/19 1615  vancomycin (VANCOCIN) IVPB 1000 mg/200 mL premix     1,000 mg 200 mL/hr over 60 Minutes Intravenous  Once 08/28/19 1601 08/28/19 1850   08/28/19 1615  cefTRIAXone (ROCEPHIN) 1 g in sodium chloride 0.9 % 100 mL IVPB  Status:  Discontinued     1 g 200 mL/hr over 30 Minutes Intravenous  Once 08/28/19 1601 08/28/19 1735     Subjective:  Patient was seen and examined at bedside.  Denies any chest pain, shortness of breath cough or hemoptysis.  Objective: Vitals:   09/06/19 0904 09/06/19 0910  BP:    Pulse:    Resp:    Temp:    SpO2: 100% 100%    Intake/Output Summary (Last 24 hours) at 09/06/2019 0914 Last data filed at 09/05/2019 1800 Gross per 24 hour  Intake 237 ml  Output -  Net 237 ml   Filed Weights   08/28/19 1241 08/28/19 1800  Weight: 51.7 kg 49.9 kg   Body mass index is 17.76 kg/m.   Physical Exam: General: Thinly built not in obvious distress HENT: Normocephalic, pupils equally reacting to light and accommodation.  No scleral pallor or icterus noted. Oral mucosa is moist.  Chest:  Clear breath sounds.  Diminished breath sounds bilaterally. No crackles or wheezes.  CVS: S1 &S2 heard.  Systolic murmur noted. Abdomen: Soft, nontender, nondistended.  Bowel sounds are heard.   Extremities: No cyanosis, clubbing or edema.  Peripheral pulses are palpable. Psych: Alert, awake and oriented, normal mood CNS:  No cranial nerve deficits.  Power equal in all extremities.   Skin: Warm and dry.  No rashes noted.   Data Review: I have personally reviewed the following laboratory data and studies,  CBC: Recent Labs  Lab 09/01/19 0616 09/02/19 0458 09/03/19 0455 09/04/19 0539 09/06/19 0440  WBC 7.9 8.0 6.2 6.4 8.0  HGB 11.4* 10.5* 10.8* 10.8* 11.8*  HCT 36.0 33.0* 34.2* 34.1* 36.3  MCV 93.3 93.0 93.4 93.2 92.6  PLT 235 238 251 273 409    Basic Metabolic Panel: Recent Labs  Lab 08/30/19 1246 08/30/19 1246 09/01/19 0616 09/02/19 0458 09/03/19 0455 09/04/19 0539 09/06/19 0440  NA 141   < > 138 136 139 138 137  K 3.7   < > 3.8 4.0 4.1 4.1 4.5  CL 107   < > 106 103 103 102 102  CO2 28   < > _0 GLUCOSE 116*   < > 111* 102* 99 95 112*  BUN 22   < > _1 CREATININE 1.29*   < > 1.25* 1.43* 1.53* 1.38* 1.52*  CALCIUM 8.7*   < > 8.6* 8.7* 8.8* 8.8* 8.9  MG 3.1*  --  2.3 2.4  --  2.2 2.3   < > = values in this interval not displayed.   Liver Function Tests: No results for input(s): AST, ALT, ALKPHOS, BILITOT, PROT, ALBUMIN in the last 168 hours. No results for input(s): LIPASE, AMYLASE in the last 168 hours. No results for input(s): AMMONIA in the  last 168 hours. Cardiac Enzymes: No results for input(s): CKTOTAL, CKMB, CKMBINDEX, TROPONINI in the last 168 hours. BNP (last 3 results) No results for input(s): BNP in the last 8760 hours.  ProBNP (last 3 results) No results for input(s): PROBNP in the last 8760 hours.  CBG: No results for input(s): GLUCAP in the last 168 hours. Recent Results (from the past 240 hour(s))  SARS CORONAVIRUS 2 (TAT 6-24 HRS) Nasopharyngeal Nasopharyngeal Swab     Status: None   Collection Time: 08/28/19  3:09 PM   Specimen: Nasopharyngeal Swab  Result Value Ref Range Status   SARS Coronavirus 2 NEGATIVE NEGATIVE Final    Comment: (NOTE) SARS-CoV-2 target nucleic acids are NOT DETECTED. The SARS-CoV-2 RNA is generally detectable in upper and lower respiratory specimens during the acute phase of infection. Negative results do not preclude SARS-CoV-2 infection, do not rule out co-infections with other pathogens, and should not be used as the sole basis for treatment or other patient management decisions. Negative results must be combined with clinical observations, patient history, and epidemiological information. The expected result is Negative. Fact Sheet for  Patients: SugarRoll.be Fact Sheet for Healthcare Providers: https://www.woods-mathews.com/ This test is not yet approved or cleared by the [place] FDA and  has been authorized for detection and/or diagnosis of SARS-CoV-2 by FDA under an Emergency Use Authorization (EUA). This EUA will remain  in effect (meaning this test can be used) for the duration of the COVID-19 declaration under Section 56 4(b)(1) of the Act, 21 U.S.C. section 360bbb-3(b)(1), unless the authorization is terminated or revoked sooner. Performed at [facility], [place] [address], [place], [place] [address]   MRSA PCR Screening     Status: Abnormal   Collection Time: 08/28/19  5:20 PM   Specimen: Nasal Mucosa; Nasopharyngeal  Result Value Ref Range Status   MRSA by PCR POSITIVE (A) NEGATIVE Final    Comment:        The GeneXpert MRSA Assay (FDA approved for NASAL specimens only), is one component of a comprehensive MRSA colonization surveillance program. It is not intended to diagnose MRSA infection nor to guide or monitor treatment for MRSA infections. RESULT CALLED TO, READ BACK BY AND VERIFIED WITH: Susann Givens RN 1911 08/28/19 JM Performed at [facility], [place] [address] [place], [place] [address]   Acid Fast Smear (AFB)     Status: None   Collection Time: 08/28/19  5:28 PM   Specimen: Sputum  Result Value Ref Range Status   AFB Specimen Processing Concentration  Final   Acid Fast Smear Negative  Final    Comment: (NOTE) Performed At: [facility] [place], [place] [id number] Rush Farmer MD [id number]    Source (AFB) SPU  Final    Comment: Performed at [facility], [place] [address], [place], [place] [address]  Culture, respiratory     Status: None   Collection Time: 08/30/19  8:37 AM   Specimen: Bronchoalveolar Lavage; Respiratory  Result Value Ref Range Status   Specimen Description    Final    BRONCHIAL ALVEOLAR LAVAGE Performed at [place] [address], [place], [place] [address]    Special Requests   Final    NONE Performed at [facility] [place] [address] [place], [place] [address]    Gram Stain NO WBC SEEN NO ORGANISMS SEEN   Final   Culture   Final    NO GROWTH 2 DAYS Performed  at [facility], [place] [address], [place], [place] [address]    Report Status 09/01/2019 FINAL  Final  Acid Fast Smear (AFB)     Status: None   Collection Time: 08/30/19  8:37 AM   Specimen: Sputum  Result Value Ref Range Status   AFB Specimen Processing Concentration  Final   Acid Fast Smear Negative  Final    Comment: (NOTE) Performed At: [facility] [place], [place] [id number] Rush Farmer MD [id number]    Source (AFB) BRONCHIAL ALVEOLAR LAVAGE  Final    Comment: Performed at [facility], [place] [address], [place], [place] [address]  Fungus Culture With Stain     Status: Abnormal   Collection Time: 08/30/19  8:37 AM   Specimen: Sputum  Result Value Ref Range Status   Fungus Stain Final report  Final   Fungus (Mycology) Culture Preliminary report (A)  Final    Comment: (NOTE) Performed At: [facility] [place], [place] [id number] Rush Farmer MD [id number]    Fungal Source BRONCHIAL ALVEOLAR LAVAGE  Final    Comment: Performed at [facility], [place] [address] [place], [place] [address]  Fungus Culture Result     Status: None   Collection Time: 08/30/19  8:37 AM  Result Value Ref Range Status   Result 1 Comment  Final    Comment: (NOTE) KOH/Calcofluor preparation:  no fungus observed. Performed At: [facility] [place], [place] [id number] Rush Farmer MD [id number]   Fungal organism reflex     Status: Abnormal   Collection Time: 08/30/19  8:37 AM  Result Value Ref Range Status   Fungal result  1 Candida glabrata (A)  Final    Comment: (NOTE) 1-2 colonies                                            . Performed At: [facility] [place], [place] [id number] Rush Farmer MD [id number]      Studies: No results found.    Flora Lipps, MD  [facility] 09/06/2019

## 2019-09-06 NOTE — Progress Notes (Signed)
PT Cancellation Note  Patient Details Name: Deborah Shields MRN: [id number] DOB: 12/10/1940   Cancelled Treatment:    Reason Eval/Treat Not Completed: Medical issues which prohibited therapy;Other (comment)(Out of building for Pacemaker placement [facility])) Rollen Sox, PT # [phone] CGV cell  Casandra Doffing 09/06/2019, 12:47 PM

## 2019-09-06 NOTE — H&P (Signed)
Patient Name: Deborah Shields   Patient Profile   Ms. Deborah Shields is a70 y.o.femalewith a history of atrial fibrillation in 2012 after shoulder surgency not on anticoagulation due to no recurrence, bilateral carotid artery disease secondary to fibromuscular dysplasia, hypertension, hyperlipidemia, hypothyroidism, and CKD stage IIIwho presented with hemoptysis.  Underwent bronchoscopy finding blood in the bronchi but cytologies were negative for malignancy.  Hospital course notable for atrial fibrillation with RVRcardiology was consulted  We had signed off yesterday.  However, we are called back again because of posttermination pauses associated atrial fibrillation  Pacing anticipated 3/15 or 16  Echocardiogram 3/21-normal LV function  SUBJECTIVE: Without complaint, no chest pain or shortness of breath Voices good understanding of plan      Past Medical History:  Diagnosis Date  . Anxiety   . Aortic insufficiency    mild due to degenerative changes  . Arthritis   . Basal cell carcinoma 2007   [place] [facility] Left leg  . Carotid artery occlusion   . Chronic kidney disease    CRD Stage 3  . Conjunctivitis due to adenovirus, both eyes   . Degenerative joint disease   . Diverticulosis of colon   . Dyspnea    with exertion  . Heart murmur   . Hyperlipidemia    NMR 07/2009: LDL 200 (2260/1233)TG 99, HDL 65. LDL goal =<120, ideally <100. father MI @ 61  . Hypertension   . Hypothyroidism    Dr Wilson Singer  . Microscopic hematuria   . Peripheral neuropathy    compressive in UE bilaterally; Dr Daylene Katayama  . Peripheral vascular disease ([place])    ICA bilat, Dr.Charles fields, [facility]  . Pneumonia    2017  . PONV (postoperative nausea and vomiting)    "Inner ear, does  okay with Scopolamine"  . Pulmonary embolus ([place])   . Rectocele   . [place] spotted fever     Scheduled Meds:  Scheduled Meds: . acidophilus   Oral QPC breakfast  .  B-complex with vitamin C  1 tablet Oral QPC supper  . Chlorhexidine Gluconate Cloth  6 each Topical Daily  . chlorpheniramine-HYDROcodone  5 mL Oral Q12H  . flecainide  50 mg Oral Q12H  . levothyroxine  75 mcg Oral QAC breakfast  . LORazepam  0.5 mg Oral QHS  . meclizine  12.5-25 mg Oral QHS  . metoprolol tartrate  25 mg Oral BID  . multivitamin with minerals  1 tablet Oral QPC supper  . senna  1 tablet Oral Daily   Continuous Infusions: acetaminophen **OR** acetaminophen, albuterol, cyclobenzaprine, guaiFENesin-dextromethorphan, ondansetron **OR** ondansetron (ZOFRAN) IV, phenol, traMADol    PHYSICAL EXAM       Vitals:   09/04/19 0521 09/04/19 1252 09/04/19 2310 09/05/19 0513  BP: (!) 161/80 (!) 153/75  (!) 167/72  Pulse: 64 (!) 59 81 (!) 58  Resp: 16 16  16   Temp: 98 F (36.7 C) 98 F (36.7 C)  98 F (36.7 C)  TempSrc: Oral Oral  Oral  SpO2: 94% 100%  94%  Weight:      Height:        Well developed and nourished in no acute distress HENT normal Neck supple with JVP- 8-10 Clear Regular rate and rhythm, 2/6 murmur  Abd-soft with active BS No Clubbing cyanosis edema Skin-warm and dry A & Oriented  Grossly normal sensory and motor function  Telemetry Personally reviewed sinus without interval afib  ECG personally reviewed 09/03/2019 atrial fibrillation with controlled ventricular response  08/31/2019 sinus rhythm with normal intervals No intake or output data in the 24 hours ending 09/05/19 0821  LABS: Basic Metabolic Panel: Last Labs             Recent Labs  Lab 08/30/19 1246 08/30/19 1246 09/01/19 0616 09/01/19 0616 09/02/19 0458 09/02/19 0458 09/03/19 0455 09/04/19 0539  NA 141  --  138  --  136  --  139 138  K 3.7  --  3.8  --  4.0  --  4.1 4.1  CL 107  --  106  --  103  --  103 102  CO2 28  --  24  --  26  --  27 27  GLUCOSE 116*  --  111*  --  102*  --  99 95  BUN 22  --  17  --  20  --  19 19  CREATININE 1.29*  --  1.25*  --   1.43*  --  1.53* 1.38*  CALCIUM 8.7*   < > 8.6*   < > 8.7*   < > 8.8* 8.8*  MG 3.1*   < > 2.3   < > 2.4  --   --  2.2   < > = values in this interval not displayed.     Cardiac Enzymes: Recent Labs (last 2 labs)   No results for input(s): CKTOTAL, CKMB, CKMBINDEX, TROPONINI in the last 72 hours.   CBC: Last Labs          Recent Labs  Lab 08/30/19 0553 09/01/19 0616 09/02/19 0458 09/03/19 0455 09/04/19 0539  WBC 7.0 7.9 8.0 6.2 6.4  HGB 11.1* 11.4* 10.5* 10.8* 10.8*  HCT 35.1* 36.0 33.0* 34.2* 34.1*  MCV 91.6 93.3 93.0 93.4 93.2  PLT 228 235 238 251 273           ASSESSMENT AND PLAN:  Atrial fibrillation/flutter with a rapid ventricular response  Posttermination pauses up to 7.7 seconds  Hemoptysis  Hypertension  Chronic kidney disease grade 3  Pacing anticipated as schedule allows 3/15  Orders written  BP poorly controlled would increase BB following pacemaker implant for both BP and HR control  For today will give hydralazine    Issues of anticoagulation will be deferred to further discussion with Dr. Thermon Leyland following discharge   Signed, Virl Axe MD  09/05/2019  EP Attending  Patient seen and examined. Discussed with Dr. Renaldo Reel. I have reviewed the indications (atrial fib with long post termination pauses and syncope) for PPM and the risks/benefits/goals/expectations and she wishes to proceed.  Mikle Bosworth.D.

## 2019-09-06 NOTE — Discharge Instructions (Signed)
    Supplemental Discharge Instructions for  Pacemaker/Defibrillator Patients  Activity No heavy lifting or vigorous activity with your left arm for 6 to 8 weeks.  Do not raise your left arm above your head for one week.  Gradually raise your affected arm as drawn below.              09/10/2019                09/11/2019                09/12/2019              09/13/2019 __  NO DRIVING for  1 week  ; you may begin driving on   7/41/2878  .  WOUND CARE - Keep the wound area clean and dry.  Do not get this area wet for one week. No showers for one week; you may shower on  09/13/2019   . - The tape/steri-strips on your wound will fall off; do not pull them off.  No bandage is needed on the site.  DO  NOT apply any creams, oils, or ointments to the wound area. - If you notice any drainage or discharge from the wound, any swelling or bruising at the site, or you develop a fever > 101? F after you are discharged home, call the office at once.  Special Instructions - You are still able to use cellular telephones; use the ear opposite the side where you have your pacemaker/defibrillator.  Avoid carrying your cellular phone near your device. - When traveling through airports, show security personnel your identification card to avoid being screened in the metal detectors.  Ask the security personnel to use the hand wand. - Avoid arc welding equipment, MRI testing (magnetic resonance imaging), TENS units (transcutaneous nerve stimulators).  Call the office for questions about other devices. - Avoid electrical appliances that are in poor condition or are not properly grounded. - Microwave ovens are safe to be near or to operate.

## 2019-09-06 NOTE — Progress Notes (Signed)
Pt has not had anything by mouth since mn except sips with meds and she has received chg bath.

## 2019-09-06 NOTE — Progress Notes (Signed)
Received pt from [facility] via [employer] [facility].  Pt alert and oriented, no distress noted.placed on monitor and IVF started in left Forearm.  Dr Lovena Le in to see pt.  Pt to procedure area via stretcher.

## 2019-09-07 ENCOUNTER — Inpatient Hospital Stay (HOSPITAL_COMMUNITY): Payer: Medicare Other

## 2019-09-07 ENCOUNTER — Other Ambulatory Visit: Payer: Self-pay | Admitting: Physician Assistant

## 2019-09-07 DIAGNOSIS — Z79899 Other long term (current) drug therapy: Secondary | ICD-10-CM

## 2019-09-07 DIAGNOSIS — I480 Paroxysmal atrial fibrillation: Secondary | ICD-10-CM

## 2019-09-07 LAB — BASIC METABOLIC PANEL
Anion gap: 13 (ref 5–15)
BUN: 16 mg/dL (ref 8–23)
CO2: 23 mmol/L (ref 22–32)
Calcium: 8.8 mg/dL — ABNORMAL LOW (ref 8.9–10.3)
Chloride: 101 mmol/L (ref 98–111)
Creatinine, Ser: 1.6 mg/dL — ABNORMAL HIGH (ref 0.44–1.00)
GFR calc Af Amer: 35 mL/min — ABNORMAL LOW (ref >60)
GFR calc non Af Amer: 30 mL/min — ABNORMAL LOW (ref >60)
Glucose, Bld: 97 mg/dL (ref 70–99)
Potassium: 3.8 mmol/L (ref 3.5–5.1)
Sodium: 137 mmol/L (ref 135–145)

## 2019-09-07 MED ORDER — METOPROLOL SUCCINATE ER 25 MG PO TB24: 25.0000 mg | ORAL_TABLET | Freq: Every day | ORAL | 0 refills | Status: DC

## 2019-09-07 MED ORDER — FLECAINIDE ACETATE 50 MG PO TABS: 50.0000 mg | ORAL_TABLET | Freq: Two times a day (BID) | ORAL | 0 refills | Status: DC

## 2019-09-07 MED ORDER — APIXABAN 2.5 MG PO TABS: 2.5000 mg | ORAL_TABLET | Freq: Two times a day (BID) | ORAL | 2 refills | Status: DC

## 2019-09-07 MED ORDER — LEVOTHYROXINE SODIUM 75 MCG PO TABS: 75.0000 ug | ORAL_TABLET | Freq: Every day | ORAL | 2 refills | Status: DC

## 2019-09-07 MED ORDER — GUAIFENESIN-DM 100-10 MG/5ML PO SYRP: 5.0000 mL | ORAL_SOLUTION | ORAL | 0 refills | Status: DC | PRN

## 2019-09-07 MED FILL — Metoprolol Tartrate IV Soln 5 MG/5ML: INTRAVENOUS | Qty: 5 | Status: AC

## 2019-09-07 NOTE — Progress Notes (Addendum)
.   Progress Note  Patient Name: Deborah Shields Date of Encounter: 09/07/2019  Primary Cardiologist: Sanda Klein, MD   Subjective   No CP or SOB, slight discomfort at pacer site  Inpatient Medications    Scheduled Meds: . acidophilus   Oral QPC breakfast  . B-complex with vitamin C  1 tablet Oral QPC supper  . Chlorhexidine Gluconate Cloth  6 each Topical Daily  . chlorpheniramine-HYDROcodone  5 mL Oral Q12H  . cholecalciferol  1,000 Units Oral Daily  . flecainide  50 mg Oral Q12H  . levothyroxine  75 mcg Oral QAC breakfast  . LORazepam  0.5 mg Oral QHS  . meclizine  12.5-25 mg Oral QHS  . metoprolol succinate  25 mg Oral Daily  . multivitamin with minerals  1 tablet Oral QPC supper  . mupirocin ointment   Nasal BID  . senna  1 tablet Oral Daily   Continuous Infusions: . sodium chloride 1,000 mL (09/07/19 0447)   PRN Meds: sodium chloride, acetaminophen **OR** acetaminophen, acetaminophen, albuterol, cyclobenzaprine, guaiFENesin-dextromethorphan, ondansetron **OR** ondansetron (ZOFRAN) IV, ondansetron (ZOFRAN) IV, phenol, traMADol   Vital Signs    Vitals:   09/06/19 2106 09/07/19 0022 09/07/19 0507 09/07/19 0729  BP: 131/82 139/82 117/73 (!) 155/81  Pulse:  75 69 73  Resp:  18 17 13   Temp:  98 F (36.7 C) 98.4 F (36.9 C) 97.8 F (36.6 C)  TempSrc:  Oral Oral Oral  SpO2:  96% 96% 96%  Weight:   48.2 kg   Height:        Intake/Output Summary (Last 24 hours) at 09/07/2019 1047 Last data filed at 09/07/2019 0506 Gross per 24 hour  Intake 855.34 ml  Output 700 ml  Net 155.34 ml   Last 3 Weights 09/07/2019 08/28/2019 08/28/2019  Weight (lbs) 106 lb 4.2 oz 110 lb 0.2 oz 114 lb  Weight (kg) 48.2 kg 49.9 kg 51.71 kg      Telemetry    AR, PAfib- Personally Reviewed  ECG    SR - Personally Reviewed  Physical Exam   GEN: No acute distress.   Neck: No JVD Cardiac: RRR, no murmurs, rubs, or gallops.  Respiratory: Clear to auscultation  bilaterally. GI: Soft, nontender, non-distended  MS: No edema; No deformity. Neuro:  Nonfocal  Psych: Normal affect   Labs    High Sensitivity Troponin:   Recent Labs  Lab 08/28/19 1247 08/28/19 1447  TROPONINIHS 18* 17      Chemistry Recent Labs  Lab 09/04/19 0539 09/06/19 0440 09/07/19 0743  NA 138 137 137  K 4.1 4.5 3.8  CL 102 102 101  CO2 27 25 23   GLUCOSE 95 112* 97  BUN 19 21 16   CREATININE 1.38* 1.52* 1.60*  CALCIUM 8.8* 8.9 8.8*  GFRNONAA 36* 32* 30*  GFRAA 42* 37* 35*  ANIONGAP 9 10 13      Hematology Recent Labs  Lab 09/03/19 0455 09/04/19 0539 09/06/19 0440  WBC 6.2 6.4 8.0  RBC 3.66* 3.66* 3.92  HGB 10.8* 10.8* 11.8*  HCT 34.2* 34.1* 36.3  MCV 93.4 93.2 92.6  MCH 29.5 29.5 30.1  MCHC 31.6 31.7 32.5  RDW 13.3 13.2 13.2  PLT 251 273 305    BNPNo results for input(s): BNP, PROBNP in the last 168 hours.   DDimer No results for input(s): DDIMER in the last 168 hours.   Radiology    DG Chest 2 View Result Date: 09/07/2019 CLINICAL DATA:  Pacemaker EXAM: CHEST - 2  VIEW COMPARISON:  08/28/2019 FINDINGS: Left pacemaker in place with leads in the right atrium and right ventricle. No pneumothorax. There is hyperinflation of the lungs compatible with COPD. Heart is normal size. Aortic atherosclerosis. Lungs clear. No effusions or acute bony abnormality. IMPRESSION: Left pacer placement without pneumothorax. COPD. No active disease. Electronically Signed   By: Rolm Baptise M.D.   On: 09/07/2019 08:41   EP PPM/ICD IMPLANT  Result Date: 09/06/2019 CONCLUSIONS:  1. Successful implantation of a [employer] dual-chamber pacemaker for symptomatic bradycardia due to sinus node dysfunction  2. No early apparent complications.       Cristopher Peru, MD 09/06/2019 9:48 AM    Cardiac Studies   Exercise Myoview 03/09/2014: Impression: Exercise Capacity: Lexiscan with no exercise. BP Response: Normal blood pressure response. Clinical Symptoms: Mild chest  pain/dyspnea. ECG Impression: No significant ST segment change suggestive of ischemia. Comparison with Prior Nuclear Study: No significant change from previous study  Overall Impression: Normal stress nuclear study.  LV Wall Motion: NL LV Function, EF 67%; NL Wall Motion _______________  Echocardiogram 12/23/2016: Study Conclusions: - Left ventricle: The cavity size was normal. Wall thickness was  normal. Systolic function was normal. The estimated ejection  fraction was in the range of 60% to 65%. Wall motion was normal;  there were no regional wall motion abnormalities. Doppler  parameters are consistent with abnormal left ventricular  relaxation (grade 1 diastolic dysfunction). The E/e&' ratio is  between 8-15, suggesting indeterminate LV filling pressure.  - Aortic valve: Trileaflet. Sclerosis without stenosis.  Transvalvular velocity was minimally increased. There was trivial  regurgitation. Mean gradient (S): 8 mm Hg. Peak gradient (S): 17  mm Hg.  - Mitral valve: Mildly thickened leaflets . There was trivial  regurgitation.  - Left atrium: The atrium was normal in size.  - Tricuspid valve: There was mild regurgitation.  - Pulmonary arteries: PA peak pressure: 27 mm Hg (S).  - Inferior vena cava: The vessel was normal in size. The  respirophasic diameter changes were in the normal range (>= 50%),  consistent with normal central venous pressure.   Impressions:  - Compared to a prior study in 2015, the LVEF is stable at 60-65%.   Patient Profile     79 y.o. female remote AFib in 2012 after shoulder surgency not on anticoagulation due to no recurrence, bilateral carotid artery disease secondary to fibromuscular dysplasia, hypertension, hyperlipidemia, hypothyroidism, and CKD stage III    Admitted to [facility] hospital with hemoptysis, hypertensive with BP as high as the 180's/100's and tachycardic.  Noted to have rapid Afib.  During her stay was  observed to have post termination pauses of > 3 seconds  Assessment & Plan    1. Paroxysmal AFib     CHA2Ds2Vasc is 4      She will need anticoagulation We recommend Eliquis 2.5mg  BID NOT BEFORE Saturday 09/11/19 and not until cleared to from a hemoptysis/pulmonary standpoint  Continue toprol and flecainide as she is getting here Outpatient EKG is ordered  CXR today is without ptx Device check today is with intact function  Wound care and activity instructions were discussed with the patient today Post pacemaker follow up is in place  OK to discharge from our perspective when ready medically otherwise EP will sign off, though remain available Please recall if needed  For questions or updates, please contact [place] Please consult [email or web address] for contact info under   Signed, Baldwin Jamaica, PA-C  09/07/2019, 10:47 AM  EP Attending  Patient seen and examined. Agree with the findings as noted above. The patient has been admitted with hemoptysis and found to have PAF and post termination pauses. She has undergone DDD PM insertion. PPM interrogation under my direction demonstrates normal device function. She will be discharged home with the usual followup. I would like her to wait an additional 4 days to start systemic anti-coagulation.  Mikle Bosworth.D.

## 2019-09-07 NOTE — Progress Notes (Signed)
Occupational Therapy Treatment Patient Details Name: Deborah Shields MRN: [id number] DOB: August 22, 1940 Today's Date: 09/07/2019    History of present illness Deborah Shields is a 79 y.o. female with medical history significant of stage III chronic kidney disease, dyslipidemia, hypertension, arthritis, and paroxysmal atrial fibrillation, DVT. Patient presented secondary to hemoptysis and found to have likely atypical pneumonia. She was also found to have afib with RVR .Patient underwent bronchoscopy on 3/8 which was significant for blood/clot and BAL performed. Patient returned to atrial fibrillation with resultant rapid ventricular response.   OT comments  Pt making progress in therapy, demonstrating improved independence with self-care tasks. Pt underwent pacemaker placement on 09/06/19. Educated/instructed pt on pacemaker precautions noting good understanding and follow through during ADLs and transfers. Pt able to ambulate to/from bathroom with min guard and without use of an AD. Noted 0 instances of LOB, however pt unsteady on feet. Pt completed toileting task requiring assist for sit to stand and cues for safety/adherence to precautions. Pt tolerated standing at the sink ~2 min to wash hands. Pt reported mod fatigue following tasks. Per RN, pt to discharge this date. Daughter present during session and will be staying with pt while she recovers. All questions/concerns answered at this time. OT will continue to follow acutely.    Follow Up Recommendations  Supervision/Assistance - 24 hour    Equipment Recommendations  None recommended by OT    Recommendations for Other Services      Precautions / Restrictions Precautions Precautions: Fall;Other (comment) Precaution Comments: Pacemaker precautions Restrictions Weight Bearing Restrictions: No       Mobility Bed Mobility Overal bed mobility: Needs Assistance Bed Mobility: Supine to Sit;Sit to Supine     Supine to sit:  Supervision;HOB elevated Sit to supine: Supervision;HOB elevated   General bed mobility comments: Cues to adhere to pacemaker precautions.   Transfers Overall transfer level: Needs assistance Equipment used: None Transfers: Sit to/from Stand Sit to Stand: Min guard         General transfer comment: To ensure balance and safety. Pt reports that she just took a pain medication that makes her feel "woozy"    Balance Overall balance assessment: Needs assistance Sitting-balance support: Feet supported;No upper extremity supported Sitting balance-Leahy Scale: Good       Standing balance-Leahy Scale: Fair                             ADL either performed or assessed with clinical judgement   ADL Overall ADL's : Needs assistance/impaired     Grooming: Wash/dry hands;Wash/dry face;Min Dispensing optician: Minimal assistance;Ambulation;Regular Toilet;Grab bars Toilet Transfer Details (indicate cue type and reason): Assist for sit to stand due to low height of toilet.  Toileting- Water quality scientist and Hygiene: Min guard;Supervision/safety;Sit to/from stand       Functional mobility during ADLs: Min guard General ADL Comments: Pt able to ambulate to/from bathroom without an assistive device. Noted 0 instances of LOB, however pt unsteady on feet. Pt tolerated standing additional ~2 min at sink to wash hands.      Vision       Perception     Praxis      Cognition Arousal/Alertness: Awake/alert Behavior During Therapy: WFL for tasks assessed/performed Overall Cognitive Status: Within Functional Limits for tasks assessed  General Comments: Pt pleasant and willing to participate in therapy.         Exercises     Shoulder Instructions       General Comments HR increased to 130bpm during activity with quick return back to 90s once seated.     Pertinent Vitals/ Pain        Pain Assessment: No/denies pain  Home Living                                          Prior Functioning/Environment              Frequency           Progress Toward Goals  OT Goals(current goals can now be found in the care plan section)  Progress towards OT goals: Progressing toward goals  ADL Goals Pt Will Perform Grooming: with modified independence Pt Will Perform Lower Body Dressing: with modified independence Pt Will Transfer to Toilet: with modified independence Pt Will Perform Toileting - Clothing Manipulation and hygiene: with modified independence Additional ADL Goal #1: Pt to recall pacemaker precautions and adhere to during ADLs and transfers with 0 verbal cues.  Plan Discharge plan remains appropriate    Co-evaluation                 AM-PAC OT "6 Clicks" Daily Activity     Outcome Measure   Help from another person eating meals?: None Help from another person taking care of personal grooming?: A Little Help from another person toileting, which includes using toliet, bedpan, or urinal?: A Little Help from another person bathing (including washing, rinsing, drying)?: A Little Help from another person to put on and taking off regular upper body clothing?: None Help from another person to put on and taking off regular lower body clothing?: A Little 6 Click Score: 20    End of Session    OT Visit Diagnosis: Unsteadiness on feet (R26.81);Other abnormalities of gait and mobility (R26.89);Muscle weakness (generalized) (M62.81);History of falling (Z91.81)   Activity Tolerance Patient limited by fatigue   Patient Left in bed;with call bell/phone within reach;with nursing/sitter in room;with family/visitor present   Nurse Communication Mobility status        Time: 3474-2595 OT Time Calculation (min): 19 min  Charges: OT General Charges $OT Visit: 1 Visit OT Treatments $Self Care/Home Management : 8-22 mins  Mauri Brooklyn  OTR/L [phone]   Mauri Brooklyn 09/07/2019, 1:53 PM

## 2019-09-07 NOTE — Plan of Care (Signed)

## 2019-09-07 NOTE — Telephone Encounter (Signed)
Left message for patient to call and reschedule  09/14/19 appointment to another date

## 2019-09-07 NOTE — Discharge Summary (Signed)
Physician Discharge Summary  Deborah Shields [id number]:[id number] DOB: 23-Dec-1940 DOA: 08/28/2019  PCP: Lajean Manes, MD  Admit date: 08/28/2019 Discharge date: 09/07/2019  Admitted From: Home Disposition:  Home with home health   Recommendations for Outpatient Follow-up:  1. Follow up with PCP in 1 week. Follow up with outpatient BMP in 1 week to ensure stability of Cr  2. Follow up with Cardiology as scheduled on 3/25  3. Follow up with Pulmonology Dr. Tamala Julian in 2-3 weeks   Discharge Condition: Stable CODE STATUS: Full  Diet recommendation: Heart healthy   Brief/Interim Summary: Jamari Diana Gregoryis an 79 y.o.femalewith medical history significant ofstage III chronic kidney disease, dyslipidemia, hypertension, arthritis, and paroxysmal atrial fibrillation. Patient presented secondary to hemoptysis and found to have likely atypical pneumonia. Patient started on empiric Vancomycin, Ceftriaxone and Azithromycin. She was evaluated by pulmonology. She was also found to have afib with RVR and started on a diltiazem drip which was discontinued. Patient underwent bronchoscopy on 3/8 which was significant for blood/clot and BAL performed. Patient returned to atrial fibrillation with resultant rapid ventricular response. She ultimately underwent pacemaker placement on 3/15.   Discharge Diagnoses:  Principal Problem:   Hemoptysis Active Problems:   Hypothyroidism   HYPERLIPIDEMIA   Essential hypertension   PAF (paroxysmal atrial fibrillation) (HCC)   COPD with chronic bronchitis and emphysema (HCC)   Pneumonia    Hemoptysis with groundglass opacities. -Status post bronchoscopy with no obvious findings.  Was initially on antibiotic for atypical pneumonia which has been discontinued.  Continue to hold aspirin.  Cultures have been negative so far.  AFB culture pending.  Pulmonary has followed the patient during hospitalization and the plan is to follow-up with Dr. Tamala Julian pulmonary as outpatient  in 2 to 3 weeks.  Bronchoalveolar lavage showing some Candida glabrata. Previous hospitalist discussed with pulmonary Dr Ander Slade and he does not indicate any antifungals at this time. -Hemoptysis resolved   Paroxysmal atrial fibrillation/flutter with RVR, post termination pauses -After bronchoscopy.  Patient with posttermination pauses.  Cardiology consulted. 2D echocardiogram showed left ventricular ejection fraction of 55 to 60% with normal wall motion. CHA2DS2-VASc = 5 ( HTN, prior PE, age x2, female)  -S/p pacemaker 3/15 -Start eliquis 3/20. Discussed with Dr. Lovena Le   Hypothyroidism -Continue synthroid   Essential hypertension -Continue metoprolol   Anxiety -Continue Ativan prn  CKD stage IIIb -Baseline Cr 1.2-1.5 -Cr 1.6 this morning    Discharge Instructions  Discharge Instructions    Call MD for:  difficulty breathing, headache or visual disturbances   Complete by: As directed    Call MD for:  extreme fatigue   Complete by: As directed    Call MD for:  persistant dizziness or light-headedness   Complete by: As directed    Call MD for:  persistant nausea and vomiting   Complete by: As directed    Call MD for:  redness, tenderness, or signs of infection (pain, swelling, redness, odor or green/yellow discharge around incision site)   Complete by: As directed    Call MD for:  severe uncontrolled pain   Complete by: As directed    Call MD for:  temperature >100.4   Complete by: As directed    Diet - low sodium heart healthy   Complete by: As directed    Diet - low sodium heart healthy   Complete by: As directed    Discharge instructions   Complete by: As directed    Follow-up with your primary care physician in  1 week. Follow-up with cardiology as outpatient. Continue to take medications as prescribed.  Follow-up for monitor placement as outpatient.   Discharge instructions   Complete by: As directed    You were cared for by a hospitalist during your hospital  stay. If you have any questions about your discharge medications or the care you received while you were in the hospital after you are discharged, you can call the unit and ask to speak with the hospitalist on call if the hospitalist that took care of you is not available. Once you are discharged, your primary care physician will handle any further medical issues. Please note that NO REFILLS for any discharge medications will be authorized once you are discharged, as it is imperative that you return to your primary care physician (or establish a relationship with a primary care physician if you do not have one) for your aftercare needs so that they can reassess your need for medications and monitor your lab values.   Increase activity slowly   Complete by: As directed    Increase activity slowly   Complete by: As directed      Allergies as of 09/07/2019      Reactions   Aspirin Nausea Only, Other (See Comments)   REACTION: GI upset  ( pt can take 81 mg but NOT   325 mg ASA )   Lovastatin Nausea Only, Other (See Comments)   REACTION: nausea   Benazepril Hcl Other (See Comments)   Unknown   Paroxetine Other (See Comments)   Unknown   Tape Other (See Comments)   Bupropion Hcl Other (See Comments)   REACTION: tinnitis   Ezetimibe Nausea And Vomiting, Other (See Comments)   REACTION: GI symptoms   Fenofibrate Other (See Comments)   Myalgia   Pravastatin Sodium Other (See Comments)   REACTION: elevated CPK - Muscle's in bilateral Leg      Medication List    TAKE these medications   apixaban 2.5 MG Tabs tablet Commonly known as: ELIQUIS Take 1 tablet (2.5 mg total) by mouth 2 (two) times daily. Start taking on: September 11, 2019   b complex vitamins tablet Take 1 tablet by mouth daily after supper.   cholecalciferol 25 MCG (1000 UNIT) tablet Commonly known as: VITAMIN D3 Take 1,000 Units by mouth daily.   cyclobenzaprine 10 MG tablet Commonly known as: FLEXERIL Take 10 mg by mouth 2  (two) times daily as needed for muscle spasms (scheduled every every).   diclofenac sodium 1 % Gel Commonly known as: VOLTAREN Apply 2-4 g topically 4 (four) times daily as needed (for pain).   flecainide 50 MG tablet Commonly known as: TAMBOCOR Take 1 tablet (50 mg total) by mouth every 12 (twelve) hours.   guaiFENesin-dextromethorphan 100-10 MG/5ML syrup Commonly known as: ROBITUSSIN DM Take 5 mLs by mouth every 4 (four) hours as needed for cough.   levothyroxine 75 MCG tablet Commonly known as: SYNTHROID Take 1 tablet (75 mcg total) by mouth daily before breakfast. Taking the brand "synthroid" What changed:   medication strength  how much to take   LORazepam 0.5 MG tablet Commonly known as: ATIVAN Take 1 tablet (0.5 mg total) by mouth 3 (three) times daily as needed. What changed: when to take this   meclizine 25 MG tablet Commonly known as: ANTIVERT Take 1 tablet (25 mg total) by mouth daily. 1/2 every 8 hrs prn for imbalance What changed:   how much to take  when to take this  reasons to take this  additional instructions   metoprolol succinate 25 MG 24 hr tablet Commonly known as: TOPROL-XL Take 1 tablet (25 mg total) by mouth daily. What changed: when to take this   senna 8.6 MG tablet Commonly known as: SENOKOT Take 1 tablet by mouth every other day.   traMADol 50 MG tablet Commonly known as: ULTRAM 1/2-1 by mouth once daily as needed. What changed:   how much to take  how to take this  when to take this  reasons to take this  additional instructions      Follow-up Information    Stoneking, Hal, MD. Schedule an appointment as soon as possible for a visit in 1 week(s).   Specialty: Internal Medicine Why: regular check up Contact information: [address] [place] [address] [phone]        Croitoru, Dani Gobble, MD Follow up in 2 week(s).   Specialty: Cardiology Why: You will be called by Dr. Victorino December scheduler  to make a 3 month follow up visit Contact information: [place] [address] [phone]        [facility] Follow up.   Specialty: Cardiology Why: 09/16/2019 @ 4:00PM, wound check visit Contact information: [address] [place]       Candee Furbish, MD. Schedule an appointment as soon as possible for a visit in 2 week(s).   Specialty: Pulmonary Disease Contact information: [place] [address] [phone]          Allergies  Allergen Reactions  . Aspirin Nausea Only and Other (See Comments)    REACTION: GI upset  ( pt can take 81 mg but NOT   325 mg ASA )  . Lovastatin Nausea Only and Other (See Comments)    REACTION: nausea  . Benazepril Hcl Other (See Comments)    Unknown  . Paroxetine Other (See Comments)    Unknown  . Tape Other (See Comments)  . Bupropion Hcl Other (See Comments)    REACTION: tinnitis  . Ezetimibe Nausea And Vomiting and Other (See Comments)    REACTION: GI symptoms  . Fenofibrate Other (See Comments)    Myalgia   . Pravastatin Sodium Other (See Comments)    REACTION: elevated CPK - Muscle's in bilateral Leg    Consultations:  PCCM  Cardiology  EP    Procedures/Studies: DG Chest 2 View  Result Date: 09/07/2019 CLINICAL DATA:  Pacemaker EXAM: CHEST - 2 VIEW COMPARISON:  08/28/2019 FINDINGS: Left pacemaker in place with leads in the right atrium and right ventricle. No pneumothorax. There is hyperinflation of the lungs compatible with COPD. Heart is normal size. Aortic atherosclerosis. Lungs clear. No effusions or acute bony abnormality. IMPRESSION: Left pacer placement without pneumothorax. COPD. No active disease. Electronically Signed   By: Rolm Baptise M.D.   On: 09/07/2019 08:41   CT Angio Chest PE W and/or Wo Contrast  Result Date: 08/28/2019 CLINICAL DATA:  Hemoptysis since 2 p.m. EXAM: CT ANGIOGRAPHY  CHEST WITH CONTRAST TECHNIQUE: Multidetector CT imaging of the chest was performed using the standard protocol during bolus administration of intravenous contrast. Multiplanar CT image reconstructions and MIPs were obtained to evaluate the vascular anatomy. CONTRAST:  133m OMNIPAQUE IOHEXOL 350 MG/ML SOLN COMPARISON:  CT 08/16/2018 FINDINGS: Cardiovascular: Satisfactory opacification the pulmonary arteries to the segmental level. No pulmonary artery filling defects are identified. Central pulmonary arteries are normal  caliber. Normal heart size. No pericardial effusion. Mild mass effect upon the right heart by a moderate pectus deformity of the chest (Haller index 3.5). Atherosclerotic plaque within the normal caliber aorta. Normal 3 vessel branching of the aortic arch. Proximal great vessels are mildly calcified. Mediastinum/Nodes: No mediastinal fluid or gas. Normal thyroid gland and thoracic inlet. No acute abnormality of the trachea. Slightly patulous appearance of the thoracic esophagus but otherwise unremarkable. No worrisome mediastinal, hilar or axillary adenopathy. Diminishing size of right hilar now measuring 7 mm in size (5/60). Lungs/Pleura: There is centrilobular and paraseptal emphysema better seen on comparison studies with less respiratory motion. Biapical pleuroparenchymal scarring is similar to the priors with some cicatricial/traction bronchiectasis there is diminished interstitial opacities in the right upper lobe when compared to prior. Chronic volume loss and scarring in the middle lobe is again seen. Diminished ground-glass and consolidative opacity lateral basal segments right lower lobe. Some ground-glass and centrilobular nodules are present within the lingula as well as some more focal areas of ground-glass attenuation in the posterior basal segment of the left lower lobe. Upper Abdomen: No acute abnormalities present in the visualized portions of the upper abdomen. Patient is post  cholecystectomy. Mild atheromatous plaque in the upper abdomen. Musculoskeletal: Multilevel degenerative changes are present in the imaged portions of the spine. Marked sclerotic degenerative changes of the lower thoracic spine and thoracolumbar junction are similar to prior. Review of the MIP images confirms the above findings. IMPRESSION: 1. No acute pulmonary artery filling defects identified. 2. Diminishing size of right hilar lymph node now measuring 7 mm in size. 3. Diminished interstitial opacities in the right upper lobe and right lower lobe. 4. Some ground-glass and centrilobular nodules in the lingula and posterior basal segment of the left lower lobe appearance of features is suggestive of a chronic atypical infectious process (including etiologies such as atypical mycobacterial infection/MAI). 5. Moderate pectus deformity of the chest with associated mild mass effect upon the right heart. 6. Aortic Atherosclerosis (ICD10-I70.0). Electronically Signed   By: Lovena Le M.D.   On: 08/28/2019 15:22   EP PPM/ICD IMPLANT  Result Date: 09/06/2019 CONCLUSIONS:  1. Successful implantation of a [employer] dual-chamber pacemaker for symptomatic bradycardia due to sinus node dysfunction  2. No early apparent complications.       Cristopher Peru, MD 09/06/2019 9:48 AM   DG Chest Port 1 View  Result Date: 08/28/2019 CLINICAL DATA:  Hemoptysis since 2 a.m. today. EXAM: PORTABLE CHEST 1 VIEW COMPARISON:  09/11/2018 FINDINGS: Normal sized heart. Tortuous and partially calcified thoracic aorta. Clear lungs with normal vascularity. The lungs remain hyperexpanded. Diffuse osteopenia. IMPRESSION: No acute abnormality. Stable changes of COPD. Electronically Signed   By: Claudie Revering M.D.   On: 08/28/2019 13:52   ECHOCARDIOGRAM COMPLETE  Result Date: 08/31/2019    ECHOCARDIOGRAM REPORT   Patient Name:   URI COVEY Date of Exam: 08/31/2019 Medical Rec #:  [id number]            Height:       66.0 in Accession #:     [id number]           Weight:       110.0 lb Date of Birth:  24-Feb-1941            BSA:          1.551 m Patient Age:    79 years             BP:  149/75 mmHg Patient Gender: F                    HR:           74 bpm. Exam Location:  Inpatient Procedure: 2D Echo, Color Doppler and Cardiac Doppler Indications:    I48.91* Unspecified atrial fibrillation  History:        Patient has prior history of Echocardiogram examinations, most                 recent 12/23/2016. COPD, Arrythmias:Atrial Fibrillation; Risk                 Factors:Hypertension and Dyslipidemia.  Sonographer:    Raquel Sarna Senior RDCS Referring Phys: [id number] [place]  1. Left ventricular ejection fraction, by estimation, is 55 to 60%. The left ventricle has normal function. The left ventricle has no regional wall motion abnormalities. Left ventricular diastolic parameters were normal.  2. Right ventricular systolic function is normal. The right ventricular size is normal. There is normal pulmonary artery systolic pressure. The estimated right ventricular systolic pressure is 89.3 mmHg.  3. The mitral valve is normal in structure. No evidence of mitral valve regurgitation.  4. The aortic valve is tricuspid. Aortic valve regurgitation is mild to moderate. Mild aortic valve stenosis. Aortic valve area, by VTI measures 1.42 cm. Aortic valve mean gradient measures 11.0 mmHg. Aortic valve Vmax measures 2.27 m/s.  5. The inferior vena cava is normal in size with greater than 50% respiratory variability, suggesting right atrial pressure of 3 mmHg. Comparison(s): No significant change from prior study. Prior images reviewed side by side. FINDINGS  Left Ventricle: Left ventricular ejection fraction, by estimation, is 55 to 60%. The left ventricle has normal function. The left ventricle has no regional wall motion abnormalities. The left ventricular internal cavity size was normal in size. There is  no left ventricular hypertrophy. Left  ventricular diastolic parameters were normal. Indeterminate filling pressures. Right Ventricle: The right ventricular size is normal. No increase in right ventricular wall thickness. Right ventricular systolic function is normal. There is normal pulmonary artery systolic pressure. The tricuspid regurgitant velocity is 2.29 m/s, and  with an assumed right atrial pressure of 3 mmHg, the estimated right ventricular systolic pressure is 81.0 mmHg. Left Atrium: Left atrial size was normal in size. Right Atrium: Right atrial size was normal in size. Pericardium: There is no evidence of pericardial effusion. Mitral Valve: The mitral valve is normal in structure. No evidence of mitral valve regurgitation. Tricuspid Valve: The tricuspid valve is normal in structure. Tricuspid valve regurgitation is trivial. Aortic Valve: The aortic valve is tricuspid. . There is moderate thickening and mild calcification of the aortic valve. Aortic valve regurgitation is mild to moderate. Aortic regurgitation PHT measures 532 msec. Mild aortic stenosis is present. There is moderate thickening of the aortic valve. There is mild calcification of the aortic valve. Aortic valve mean gradient measures 11.0 mmHg. Aortic valve peak gradient measures 20.6 mmHg. Aortic valve area, by VTI measures 1.42 cm. Pulmonic Valve: The pulmonic valve was not well visualized. Pulmonic valve regurgitation is not visualized. Aorta: The aortic root and ascending aorta are structurally normal, with no evidence of dilitation. Venous: The inferior vena cava is normal in size with greater than 50% respiratory variability, suggesting right atrial pressure of 3 mmHg. IAS/Shunts: No atrial level shunt detected by color flow Doppler.  LEFT VENTRICLE PLAX 2D LVIDd:  3.70 cm  Diastology LVIDs:         2.50 cm  LV e' lateral:   7.72 cm/s LV PW:         0.90 cm  LV E/e' lateral: 9.2 LV IVS:        0.80 cm  LV e' medial:    5.55 cm/s LVOT diam:     1.98 cm  LV E/e'  medial:  12.7 LV SV:         62 LV SV Index:   40 LVOT Area:     3.08 cm  RIGHT VENTRICLE RV S prime:     10.80 cm/s TAPSE (M-mode): 1.8 cm LEFT ATRIUM             Index       RIGHT ATRIUM           Index LA diam:        2.50 cm 1.61 cm/m  RA Area:     11.40 cm LA Vol (A2C):   43.9 ml 28.30 ml/m RA Volume:   25.20 ml  16.25 ml/m LA Vol (A4C):   29.9 ml 19.28 ml/m LA Biplane Vol: 37.7 ml 24.30 ml/m  AORTIC VALVE AV Area (Vmax):    1.41 cm AV Area (Vmean):   1.33 cm AV Area (VTI):     1.42 cm AV Vmax:           227.00 cm/s AV Vmean:          152.000 cm/s AV VTI:            0.439 m AV Peak Grad:      20.6 mmHg AV Mean Grad:      11.0 mmHg LVOT Vmax:         104.00 cm/s LVOT Vmean:        65.900 cm/s LVOT VTI:          0.202 m LVOT/AV VTI ratio: 0.46 AI PHT:            532 msec  AORTA Ao Root diam: 2.60 cm Ao Asc diam:  2.90 cm MITRAL VALVE               TRICUSPID VALVE MV Area (PHT): 3.19 cm    TR Peak grad:   21.0 mmHg MV Decel Time: 238 msec    TR Vmax:        229.00 cm/s MV E velocity: 70.70 cm/s MV A velocity: 66.00 cm/s  SHUNTS MV E/A ratio:  1.07        Systemic VTI:  0.20 m                            Systemic Diam: 1.98 cm Mihai Croitoru MD Electronically signed by Sanda Klein MD Signature Date/Time: 08/31/2019/12:01:09 PM    Final        Discharge Exam: Vitals:   09/07/19 0507 09/07/19 0729  BP: 117/73 (!) 155/81  Pulse: 69 73  Resp: 17 13  Temp: 98.4 F (36.9 C) 97.8 F (36.6 C)  SpO2: 96% 96%    General: Pt is alert, awake, not in acute distress Cardiovascular: S1/S2 +, no edema Respiratory: CTA bilaterally, no wheezing, no rhonchi, no respiratory distress, no conversational dyspnea  Abdominal: Soft, NT, ND, bowel sounds + Extremities: no edema, no cyanosis Psych: Normal mood and affect, stable judgement and insight     The results of significant diagnostics from this hospitalization (including  imaging, microbiology, ancillary and laboratory) are listed below for  reference.     Microbiology: Recent Results (from the past 240 hour(s))  SARS CORONAVIRUS 2 (TAT 6-24 HRS) Nasopharyngeal Nasopharyngeal Swab     Status: None   Collection Time: 08/28/19  3:09 PM   Specimen: Nasopharyngeal Swab  Result Value Ref Range Status   SARS Coronavirus 2 NEGATIVE NEGATIVE Final    Comment: (NOTE) SARS-CoV-2 target nucleic acids are NOT DETECTED. The SARS-CoV-2 RNA is generally detectable in upper and lower respiratory specimens during the acute phase of infection. Negative results do not preclude SARS-CoV-2 infection, do not rule out co-infections with other pathogens, and should not be used as the sole basis for treatment or other patient management decisions. Negative results must be combined with clinical observations, patient history, and epidemiological information. The expected result is Negative. Fact Sheet for Patients: SugarRoll.be Fact Sheet for Healthcare Providers: https://www.woods-mathews.com/ This test is not yet approved or cleared by the [place] FDA and  has been authorized for detection and/or diagnosis of SARS-CoV-2 by FDA under an Emergency Use Authorization (EUA). This EUA will remain  in effect (meaning this test can be used) for the duration of the COVID-19 declaration under Section 56 4(b)(1) of the Act, 21 U.S.C. section 360bbb-3(b)(1), unless the authorization is terminated or revoked sooner. Performed at [facility], [place] [address], [place], [place] [address]   MRSA PCR Screening     Status: Abnormal   Collection Time: 08/28/19  5:20 PM   Specimen: Nasal Mucosa; Nasopharyngeal  Result Value Ref Range Status   MRSA by PCR POSITIVE (A) NEGATIVE Final    Comment:        The GeneXpert MRSA Assay (FDA approved for NASAL specimens only), is one component of a comprehensive MRSA colonization surveillance program. It is not intended to diagnose MRSA infection nor to guide  or monitor treatment for MRSA infections. RESULT CALLED TO, READ BACK BY AND VERIFIED WITH: Susann Givens RN 1911 08/28/19 JM Performed at [facility], [place] [address], [place], [place] [address]   Acid Fast Smear (AFB)     Status: None   Collection Time: 08/28/19  5:28 PM   Specimen: Sputum  Result Value Ref Range Status   AFB Specimen Processing Concentration  Final   Acid Fast Smear Negative  Final    Comment: (NOTE) Performed At: [facility] [place], [place] [id number] Rush Farmer MD [id number]    Source (AFB) SPU  Final    Comment: Performed at [facility], [place] [address], [place], [place] [address]  Culture, respiratory     Status: None   Collection Time: 08/30/19  8:37 AM   Specimen: Bronchoalveolar Lavage; Respiratory  Result Value Ref Range Status   Specimen Description   Final    BRONCHIAL ALVEOLAR LAVAGE Performed at [facility] [address] [place], [place] [address]    Special Requests   Final    NONE Performed at [facility], [place] [address], [place], [place] [address]    Gram Stain NO WBC SEEN NO ORGANISMS SEEN   Final   Culture   Final    NO GROWTH 2 DAYS Performed at [facility], [place] [address] [place], [place] [address]    Report Status 09/01/2019 FINAL  Final  Acid Fast Smear (AFB)     Status: None   Collection Time: 08/30/19  8:37 AM   Specimen: Sputum  Result Value Ref Range Status   AFB Specimen Processing Concentration  Final   Acid Fast Smear Negative  Final    Comment: (NOTE) Performed At: [facility] [place], [place] [id number] Rush Farmer MD [id number]    Source (AFB) BRONCHIAL ALVEOLAR LAVAGE  Final    Comment: Performed at [facility] [address], [place], [place] [address]  Fungus Culture With Stain     Status: Abnormal   Collection Time: 08/30/19  8:37 AM    Specimen: Sputum  Result Value Ref Range Status   Fungus Stain Final report  Final   Fungus (Mycology) Culture Preliminary report (A)  Final    Comment: (NOTE) Performed At: [facility] [place], [place] [id number] Rush Farmer MD [id number]    Fungal Source BRONCHIAL ALVEOLAR LAVAGE  Final    Comment: Performed at [facility], [place] [address] [place], [place] [address]  Fungus Culture Result     Status: None   Collection Time: 08/30/19  8:37 AM  Result Value Ref Range Status   Result 1 Comment  Final    Comment: (NOTE) KOH/Calcofluor preparation:  no fungus observed. Performed At: [facility] [place], [place] [id number] Rush Farmer MD [id number]   Fungal organism reflex     Status: Abnormal   Collection Time: 08/30/19  8:37 AM  Result Value Ref Range Status   Fungal result 1 Candida glabrata (A)  Final    Comment: (NOTE) 1-2 colonies                                            . Performed At: [facility] [place], [place] [id number] Rush Farmer MD [id number]      Labs: BNP (last 3 results) No results for input(s): BNP in the last 8760 hours. Basic Metabolic Panel: Recent Labs  Lab 09/01/19 0616 09/01/19 0616 09/02/19 0458 09/03/19 0455 09/04/19 0539 09/06/19 0440 09/07/19 0743  NA 138   < > 136 139 138 137 137  K 3.8   < > 4.0 4.1 4.1 4.5 3.8  CL 106   < > 103 103 102 102 101  CO2 24   < > _0 GLUCOSE 111*   < > 102* 99 95 112* 97  BUN 17   < > _1 CREATININE 1.25*   < > 1.43* 1.53* 1.38* 1.52* 1.60*  CALCIUM 8.6*   < > 8.7* 8.8* 8.8* 8.9 8.8*  MG 2.3  --  2.4  --  2.2 2.3  --    < > = values in this interval not displayed.   Liver Function Tests: No results for input(s): AST, ALT, ALKPHOS, BILITOT, PROT, ALBUMIN in the last 168 hours. No results for input(s): LIPASE, AMYLASE in the last 168 hours. No results for input(s):  AMMONIA in the last 168 hours. CBC: Recent Labs  Lab 09/01/19 0616 09/02/19 0458 09/03/19 0455 09/04/19 0539 09/06/19 0440  WBC 7.9 8.0 6.2 6.4 8.0  HGB 11.4* 10.5* 10.8* 10.8* 11.8*  HCT 36.0 33.0* 34.2* 34.1* 36.3  MCV 93.3 93.0 93.4 93.2 92.6  PLT 235 238 251 273 305   Cardiac Enzymes: No results for input(s): CKTOTAL, CKMB, CKMBINDEX, TROPONINI in the last 168 hours. BNP: Invalid input(s): POCBNP CBG: No results  for input(s): GLUCAP in the last 168 hours. D-Dimer No results for input(s): DDIMER in the last 72 hours. Hgb A1c No results for input(s): HGBA1C in the last 72 hours. Lipid Profile No results for input(s): CHOL, HDL, LDLCALC, TRIG, CHOLHDL, LDLDIRECT in the last 72 hours. Thyroid function studies No results for input(s): TSH, T4TOTAL, T3FREE, THYROIDAB in the last 72 hours.  Invalid input(s): FREET3 Anemia work up No results for input(s): VITAMINB12, FOLATE, FERRITIN, TIBC, IRON, RETICCTPCT in the last 72 hours. Urinalysis    Component Value Date/Time   COLORURINE YELLOW 08/17/2018 0505   APPEARANCEUR CLEAR 08/17/2018 0505   LABSPEC 1.024 08/17/2018 0505   PHURINE 6.0 08/17/2018 0505   GLUCOSEU NEGATIVE 08/17/2018 0505   GLUCOSEU NEGATIVE 11/21/2015 1156   HGBUR MODERATE (A) 08/17/2018 0505   BILIRUBINUR NEGATIVE 08/17/2018 0505   BILIRUBINUR Neg 09/07/2012 1426   KETONESUR NEGATIVE 08/17/2018 0505   PROTEINUR NEGATIVE 08/17/2018 0505   UROBILINOGEN 0.2 11/21/2015 1156   NITRITE NEGATIVE 08/17/2018 0505   LEUKOCYTESUR NEGATIVE 08/17/2018 0505   Sepsis Labs Invalid input(s): PROCALCITONIN,  WBC,  LACTICIDVEN Microbiology Recent Results (from the past 240 hour(s))  SARS CORONAVIRUS 2 (TAT 6-24 HRS) Nasopharyngeal Nasopharyngeal Swab     Status: None   Collection Time: 08/28/19  3:09 PM   Specimen: Nasopharyngeal Swab  Result Value Ref Range Status   SARS Coronavirus 2 NEGATIVE NEGATIVE Final    Comment: (NOTE) SARS-CoV-2 target nucleic acids are  NOT DETECTED. The SARS-CoV-2 RNA is generally detectable in upper and lower respiratory specimens during the acute phase of infection. Negative results do not preclude SARS-CoV-2 infection, do not rule out co-infections with other pathogens, and should not be used as the sole basis for treatment or other patient management decisions. Negative results must be combined with clinical observations, patient history, and epidemiological information. The expected result is Negative. Fact Sheet for Patients: SugarRoll.be Fact Sheet for Healthcare Providers: https://www.woods-mathews.com/ This test is not yet approved or cleared by the [place] FDA and  has been authorized for detection and/or diagnosis of SARS-CoV-2 by FDA under an Emergency Use Authorization (EUA). This EUA will remain  in effect (meaning this test can be used) for the duration of the COVID-19 declaration under Section 56 4(b)(1) of the Act, 21 U.S.C. section 360bbb-3(b)(1), unless the authorization is terminated or revoked sooner. Performed at [facility], [place] [address], [place], [place] [address]   MRSA PCR Screening     Status: Abnormal   Collection Time: 08/28/19  5:20 PM   Specimen: Nasal Mucosa; Nasopharyngeal  Result Value Ref Range Status   MRSA by PCR POSITIVE (A) NEGATIVE Final    Comment:        The GeneXpert MRSA Assay (FDA approved for NASAL specimens only), is one component of a comprehensive MRSA colonization surveillance program. It is not intended to diagnose MRSA infection nor to guide or monitor treatment for MRSA infections. RESULT CALLED TO, READ BACK BY AND VERIFIED WITH: Susann Givens RN 1911 08/28/19 JM Performed at [facility], [place] [address] [place], [place] [address]   Acid Fast Smear (AFB)     Status: None   Collection Time: 08/28/19  5:28 PM   Specimen: Sputum  Result Value Ref Range Status   AFB Specimen  Processing Concentration  Final   Acid Fast Smear Negative  Final    Comment: (NOTE) Performed At: [facility] [place], [place] [id number] Rush Farmer MD BT:[id number]  Source (AFB) SPU  Final    Comment: Performed at [facility], [place] [address] [place], [place] [address]  Culture, respiratory     Status: None   Collection Time: 08/30/19  8:37 AM   Specimen: Bronchoalveolar Lavage; Respiratory  Result Value Ref Range Status   Specimen Description   Final    BRONCHIAL ALVEOLAR LAVAGE Performed at [place] [address], [place], [place] [address]    Special Requests   Final    NONE Performed at [facility], [place] [address], [place], [place] [address]    Gram Stain NO WBC SEEN NO ORGANISMS SEEN   Final   Culture   Final    NO GROWTH 2 DAYS Performed at [facility], [place] [address] [place], [place] [address]    Report Status 09/01/2019 FINAL  Final  Acid Fast Smear (AFB)     Status: None   Collection Time: 08/30/19  8:37 AM   Specimen: Sputum  Result Value Ref Range Status   AFB Specimen Processing Concentration  Final   Acid Fast Smear Negative  Final    Comment: (NOTE) Performed At: [facility] [place], [place] [id number] Rush Farmer MD VO:[id number]    Source (AFB) BRONCHIAL ALVEOLAR LAVAGE  Final    Comment: Performed at [facility], [place] [address] [place], [place] [address]  Fungus Culture With Stain     Status: Abnormal   Collection Time: 08/30/19  8:37 AM   Specimen: Sputum  Result Value Ref Range Status   Fungus Stain Final report  Final   Fungus (Mycology) Culture Preliminary report (A)  Final    Comment: (NOTE) Performed At: [facility] [place], [place] [id number] Rush Farmer MD YB:[id number]    Fungal Source BRONCHIAL ALVEOLAR LAVAGE  Final    Comment: Performed at [facility], [place] [address] [place], [place] [address]  Fungus Culture Result     Status: None   Collection Time: 08/30/19  8:37 AM  Result Value Ref Range Status   Result 1 Comment  Final    Comment: (NOTE) KOH/Calcofluor preparation:  no fungus observed. Performed At: [facility] [place], [place] [id number] Rush Farmer MD EL:[id number]   Fungal organism reflex     Status: Abnormal   Collection Time: 08/30/19  8:37 AM  Result Value Ref Range Status   Fungal result 1 Candida glabrata (A)  Final    Comment: (NOTE) 1-2 colonies                                            . Performed At: [facility] [place], [place] [id number] Rush Farmer MD GB:[id number]      Patient was seen and examined on the day of discharge and was found to be in stable condition. Time coordinating discharge: 40 minutes including assessment and coordination of care, as well as examination of the patient.   SIGNED:  Dessa Phi, DO [facility] 09/07/2019, 10:45 AM

## 2019-09-07 NOTE — Telephone Encounter (Signed)
Patient is still in the hospital 09/07/19 - ta

## 2019-09-09 DIAGNOSIS — I739 Peripheral vascular disease, unspecified: Secondary | ICD-10-CM | POA: Diagnosis not present

## 2019-09-09 DIAGNOSIS — J440 Chronic obstructive pulmonary disease with acute lower respiratory infection: Secondary | ICD-10-CM | POA: Diagnosis not present

## 2019-09-09 DIAGNOSIS — I700 Atherosclerosis of aorta: Secondary | ICD-10-CM | POA: Diagnosis not present

## 2019-09-09 DIAGNOSIS — Z7901 Long term (current) use of anticoagulants: Secondary | ICD-10-CM | POA: Diagnosis not present

## 2019-09-09 DIAGNOSIS — J189 Pneumonia, unspecified organism: Secondary | ICD-10-CM | POA: Diagnosis not present

## 2019-09-09 DIAGNOSIS — Z48812 Encounter for surgical aftercare following surgery on the circulatory system: Secondary | ICD-10-CM | POA: Diagnosis not present

## 2019-09-09 DIAGNOSIS — M1990 Unspecified osteoarthritis, unspecified site: Secondary | ICD-10-CM | POA: Diagnosis not present

## 2019-09-09 DIAGNOSIS — N1832 Chronic kidney disease, stage 3b: Secondary | ICD-10-CM | POA: Diagnosis not present

## 2019-09-09 DIAGNOSIS — I480 Paroxysmal atrial fibrillation: Secondary | ICD-10-CM | POA: Diagnosis not present

## 2019-09-09 DIAGNOSIS — E785 Hyperlipidemia, unspecified: Secondary | ICD-10-CM | POA: Diagnosis not present

## 2019-09-09 DIAGNOSIS — I129 Hypertensive chronic kidney disease with stage 1 through stage 4 chronic kidney disease, or unspecified chronic kidney disease: Secondary | ICD-10-CM | POA: Diagnosis not present

## 2019-09-09 DIAGNOSIS — I350 Nonrheumatic aortic (valve) stenosis: Secondary | ICD-10-CM | POA: Diagnosis not present

## 2019-09-09 DIAGNOSIS — E039 Hypothyroidism, unspecified: Secondary | ICD-10-CM | POA: Diagnosis not present

## 2019-09-09 DIAGNOSIS — Z950 Presence of cardiac pacemaker: Secondary | ICD-10-CM | POA: Diagnosis not present

## 2019-09-09 DIAGNOSIS — G629 Polyneuropathy, unspecified: Secondary | ICD-10-CM | POA: Diagnosis not present

## 2019-09-09 DIAGNOSIS — I773 Arterial fibromuscular dysplasia: Secondary | ICD-10-CM | POA: Diagnosis not present

## 2019-09-09 DIAGNOSIS — Z86711 Personal history of pulmonary embolism: Secondary | ICD-10-CM | POA: Diagnosis not present

## 2019-09-09 DIAGNOSIS — F419 Anxiety disorder, unspecified: Secondary | ICD-10-CM | POA: Diagnosis not present

## 2019-09-09 DIAGNOSIS — Z85828 Personal history of other malignant neoplasm of skin: Secondary | ICD-10-CM | POA: Diagnosis not present

## 2019-09-09 DIAGNOSIS — K5790 Diverticulosis of intestine, part unspecified, without perforation or abscess without bleeding: Secondary | ICD-10-CM | POA: Diagnosis not present

## 2019-09-10 DIAGNOSIS — I773 Arterial fibromuscular dysplasia: Secondary | ICD-10-CM | POA: Diagnosis not present

## 2019-09-10 DIAGNOSIS — I480 Paroxysmal atrial fibrillation: Secondary | ICD-10-CM | POA: Diagnosis not present

## 2019-09-10 DIAGNOSIS — J189 Pneumonia, unspecified organism: Secondary | ICD-10-CM | POA: Diagnosis not present

## 2019-09-10 DIAGNOSIS — I129 Hypertensive chronic kidney disease with stage 1 through stage 4 chronic kidney disease, or unspecified chronic kidney disease: Secondary | ICD-10-CM | POA: Diagnosis not present

## 2019-09-10 DIAGNOSIS — J440 Chronic obstructive pulmonary disease with acute lower respiratory infection: Secondary | ICD-10-CM | POA: Diagnosis not present

## 2019-09-10 DIAGNOSIS — Z48812 Encounter for surgical aftercare following surgery on the circulatory system: Secondary | ICD-10-CM | POA: Diagnosis not present

## 2019-09-13 ENCOUNTER — Telehealth: Payer: Self-pay | Admitting: Cardiovascular Disease

## 2019-09-13 NOTE — Telephone Encounter (Signed)
Deborah Shields, Daughter of the patient would like permission to attend all future appointments for the patient. The daughter is the patient's primary caregiver. The patient would like the daughter to be able to take notes and ask questions for the patient. The patient also does not want to come in without her daughter.  Please let the daughter know what the office decides

## 2019-09-13 NOTE — Telephone Encounter (Signed)
Returned the call to the patient's daughter, per the dpr.  She will be returning the patient's cardiac monitor since the patient now has a pacemaker. The monitor had been ordered when the patient was in the hospital.   She asked if she could come to her mother's appointments sine her mother is having memory problems. She has been advised that this is okay and will be noted in her chart.

## 2019-09-14 ENCOUNTER — Ambulatory Visit: Payer: Medicare Other | Admitting: Medical

## 2019-09-14 DIAGNOSIS — J440 Chronic obstructive pulmonary disease with acute lower respiratory infection: Secondary | ICD-10-CM | POA: Diagnosis not present

## 2019-09-14 DIAGNOSIS — I773 Arterial fibromuscular dysplasia: Secondary | ICD-10-CM | POA: Diagnosis not present

## 2019-09-14 DIAGNOSIS — J189 Pneumonia, unspecified organism: Secondary | ICD-10-CM | POA: Diagnosis not present

## 2019-09-14 DIAGNOSIS — I129 Hypertensive chronic kidney disease with stage 1 through stage 4 chronic kidney disease, or unspecified chronic kidney disease: Secondary | ICD-10-CM | POA: Diagnosis not present

## 2019-09-14 DIAGNOSIS — I480 Paroxysmal atrial fibrillation: Secondary | ICD-10-CM | POA: Diagnosis not present

## 2019-09-14 DIAGNOSIS — Z48812 Encounter for surgical aftercare following surgery on the circulatory system: Secondary | ICD-10-CM | POA: Diagnosis not present

## 2019-09-14 NOTE — Telephone Encounter (Signed)
Lmom for to schedule a hospital follow-up appt with Dr. Elsworth Soho or NP for 2 wks.  ta

## 2019-09-16 ENCOUNTER — Ambulatory Visit (INDEPENDENT_AMBULATORY_CARE_PROVIDER_SITE_OTHER): Payer: Medicare Other | Admitting: *Deleted

## 2019-09-16 ENCOUNTER — Other Ambulatory Visit: Payer: Self-pay

## 2019-09-16 DIAGNOSIS — I495 Sick sinus syndrome: Secondary | ICD-10-CM | POA: Diagnosis not present

## 2019-09-16 DIAGNOSIS — I480 Paroxysmal atrial fibrillation: Secondary | ICD-10-CM

## 2019-09-16 NOTE — Patient Instructions (Signed)
Call the office if you have any drainage, redness or increased swelling at wound site. Call the office if your wound becomes warm to touch or you develop a fever or chills. If yo develop a fever or chills after 5 pm or on a weekend seek treatment in the emergency room. [facility]: [phone]

## 2019-09-17 DIAGNOSIS — J189 Pneumonia, unspecified organism: Secondary | ICD-10-CM | POA: Diagnosis not present

## 2019-09-17 DIAGNOSIS — I773 Arterial fibromuscular dysplasia: Secondary | ICD-10-CM | POA: Diagnosis not present

## 2019-09-17 DIAGNOSIS — J449 Chronic obstructive pulmonary disease, unspecified: Secondary | ICD-10-CM | POA: Diagnosis not present

## 2019-09-17 DIAGNOSIS — I480 Paroxysmal atrial fibrillation: Secondary | ICD-10-CM | POA: Diagnosis not present

## 2019-09-17 DIAGNOSIS — E440 Moderate protein-calorie malnutrition: Secondary | ICD-10-CM | POA: Diagnosis not present

## 2019-09-17 DIAGNOSIS — I700 Atherosclerosis of aorta: Secondary | ICD-10-CM | POA: Diagnosis not present

## 2019-09-17 DIAGNOSIS — J440 Chronic obstructive pulmonary disease with acute lower respiratory infection: Secondary | ICD-10-CM | POA: Diagnosis not present

## 2019-09-17 DIAGNOSIS — I129 Hypertensive chronic kidney disease with stage 1 through stage 4 chronic kidney disease, or unspecified chronic kidney disease: Secondary | ICD-10-CM | POA: Diagnosis not present

## 2019-09-17 DIAGNOSIS — Z950 Presence of cardiac pacemaker: Secondary | ICD-10-CM | POA: Diagnosis not present

## 2019-09-17 DIAGNOSIS — N1832 Chronic kidney disease, stage 3b: Secondary | ICD-10-CM | POA: Diagnosis not present

## 2019-09-17 DIAGNOSIS — Z48812 Encounter for surgical aftercare following surgery on the circulatory system: Secondary | ICD-10-CM | POA: Diagnosis not present

## 2019-09-17 DIAGNOSIS — I495 Sick sinus syndrome: Secondary | ICD-10-CM | POA: Diagnosis not present

## 2019-09-17 NOTE — Telephone Encounter (Signed)
Spoke with daughter Lynelle Smoke, advised that [facility] has no openings this upcoming month and we would like pt to be seen before then.  Pt scheduled with Aaron Edelman.  Nothing further needed at this time- will close encounter.

## 2019-09-17 NOTE — Telephone Encounter (Signed)
[name] daughter states patient needs appt with Dr. Elsworth Soho for hospital f/u. In two weeks. Declined appt with NP. [name] phone number is [phone].

## 2019-09-17 NOTE — Telephone Encounter (Signed)
09/17/19 - 2nd call.  LMOM for patient to schedule hospital follow-up w/Dr. Elsworth Soho or NP for 2 weeks.ta

## 2019-09-20 DIAGNOSIS — J440 Chronic obstructive pulmonary disease with acute lower respiratory infection: Secondary | ICD-10-CM | POA: Diagnosis not present

## 2019-09-20 DIAGNOSIS — J189 Pneumonia, unspecified organism: Secondary | ICD-10-CM | POA: Diagnosis not present

## 2019-09-20 DIAGNOSIS — I773 Arterial fibromuscular dysplasia: Secondary | ICD-10-CM | POA: Diagnosis not present

## 2019-09-20 DIAGNOSIS — I129 Hypertensive chronic kidney disease with stage 1 through stage 4 chronic kidney disease, or unspecified chronic kidney disease: Secondary | ICD-10-CM | POA: Diagnosis not present

## 2019-09-20 DIAGNOSIS — I480 Paroxysmal atrial fibrillation: Secondary | ICD-10-CM | POA: Diagnosis not present

## 2019-09-20 DIAGNOSIS — Z48812 Encounter for surgical aftercare following surgery on the circulatory system: Secondary | ICD-10-CM | POA: Diagnosis not present

## 2019-09-20 LAB — CUP PACEART INCLINIC DEVICE CHECK
Battery Remaining Longevity: 99 mo
Battery Voltage: 3.01 V
Brady Statistic RA Percent Paced: 51 %
Brady Statistic RV Percent Paced: 0.21 %
Date Time Interrogation Session: 20210325155400
Implantable Lead Implant Date: 20210315
Implantable Lead Implant Date: 20210315
Implantable Lead Location: 753859
Implantable Lead Location: 753860
Implantable Pulse Generator Implant Date: 20210315
Lead Channel Impedance Value: 412.5 Ohm
Lead Channel Impedance Value: 637.5 Ohm
Lead Channel Pacing Threshold Amplitude: 0.75 V
Lead Channel Pacing Threshold Amplitude: 1 V
Lead Channel Pacing Threshold Pulse Width: 0.5 ms
Lead Channel Pacing Threshold Pulse Width: 0.5 ms
Lead Channel Sensing Intrinsic Amplitude: 7.9 mV
Lead Channel Setting Pacing Amplitude: 3.5 V
Lead Channel Setting Pacing Amplitude: 3.5 V
Lead Channel Setting Pacing Pulse Width: 0.5 ms
Lead Channel Setting Sensing Sensitivity: 2 mV
Pulse Gen Model: 2272
Pulse Gen Serial Number: 3805113

## 2019-09-20 NOTE — Progress Notes (Signed)
Wound check appointment. Steri-strips removed. Wound without redness or edema. Incision edges approximated, wound well healed. Normal device function. Thresholds, sensing, and impedances consistent with implant measurements. Device programmed at 3.5V/auto capture programmed on for extra safety margin until 3 month visit. Histogram distribution appropriate for patient and level of activity. AT/AF burden 2.8 %, 25 AMS episodes with EGMs that show AF with highest v-rate of 142 bpm and longest episode was 7 minutes and 46 seconds.  No high ventricular rates noted. Patient educated about wound care, arm mobility, lifting restrictions. Next remote scheduled for 12/06/19.  ROV with [place] in 3 months.

## 2019-09-22 DIAGNOSIS — M85851 Other specified disorders of bone density and structure, right thigh: Secondary | ICD-10-CM | POA: Diagnosis not present

## 2019-09-22 DIAGNOSIS — M85852 Other specified disorders of bone density and structure, left thigh: Secondary | ICD-10-CM | POA: Diagnosis not present

## 2019-09-23 ENCOUNTER — Telehealth: Payer: Self-pay | Admitting: Pulmonary Disease

## 2019-09-23 NOTE — Telephone Encounter (Signed)
Spoke with patient's daughter Lynelle Smoke. Advised her that [facility] had an opening for 4/9 at 1030am. Her agreed to this appointment. Appointment has been changed.   Nothing further needed at time of call.

## 2019-09-27 ENCOUNTER — Inpatient Hospital Stay: Payer: Medicare Other | Admitting: Pulmonary Disease

## 2019-09-28 LAB — FUNGUS CULTURE RESULT

## 2019-09-28 LAB — FUNGUS CULTURE WITH STAIN

## 2019-09-28 LAB — FUNGAL ORGANISM REFLEX

## 2019-09-29 DIAGNOSIS — I129 Hypertensive chronic kidney disease with stage 1 through stage 4 chronic kidney disease, or unspecified chronic kidney disease: Secondary | ICD-10-CM | POA: Diagnosis not present

## 2019-09-29 DIAGNOSIS — M85851 Other specified disorders of bone density and structure, right thigh: Secondary | ICD-10-CM | POA: Diagnosis not present

## 2019-09-29 DIAGNOSIS — I495 Sick sinus syndrome: Secondary | ICD-10-CM | POA: Diagnosis not present

## 2019-09-29 DIAGNOSIS — M85852 Other specified disorders of bone density and structure, left thigh: Secondary | ICD-10-CM | POA: Diagnosis not present

## 2019-09-29 DIAGNOSIS — D6869 Other thrombophilia: Secondary | ICD-10-CM | POA: Diagnosis not present

## 2019-09-29 DIAGNOSIS — N1832 Chronic kidney disease, stage 3b: Secondary | ICD-10-CM | POA: Diagnosis not present

## 2019-09-29 DIAGNOSIS — Z950 Presence of cardiac pacemaker: Secondary | ICD-10-CM | POA: Diagnosis not present

## 2019-09-30 IMAGING — CR CHEST - 2 VIEW
2 series · 2 of 2 positions shown · non-contrast
Comparison: CT chest 08/16/2018. PA and lateral chest 05/13/2016,
07/15/2017 and 08/16/2018.

CLINICAL DATA: Shortness of breath and chest pain. Recent diagnosis
of pneumonia.

EXAM:
CHEST - 2 VIEW

[w chest pa]
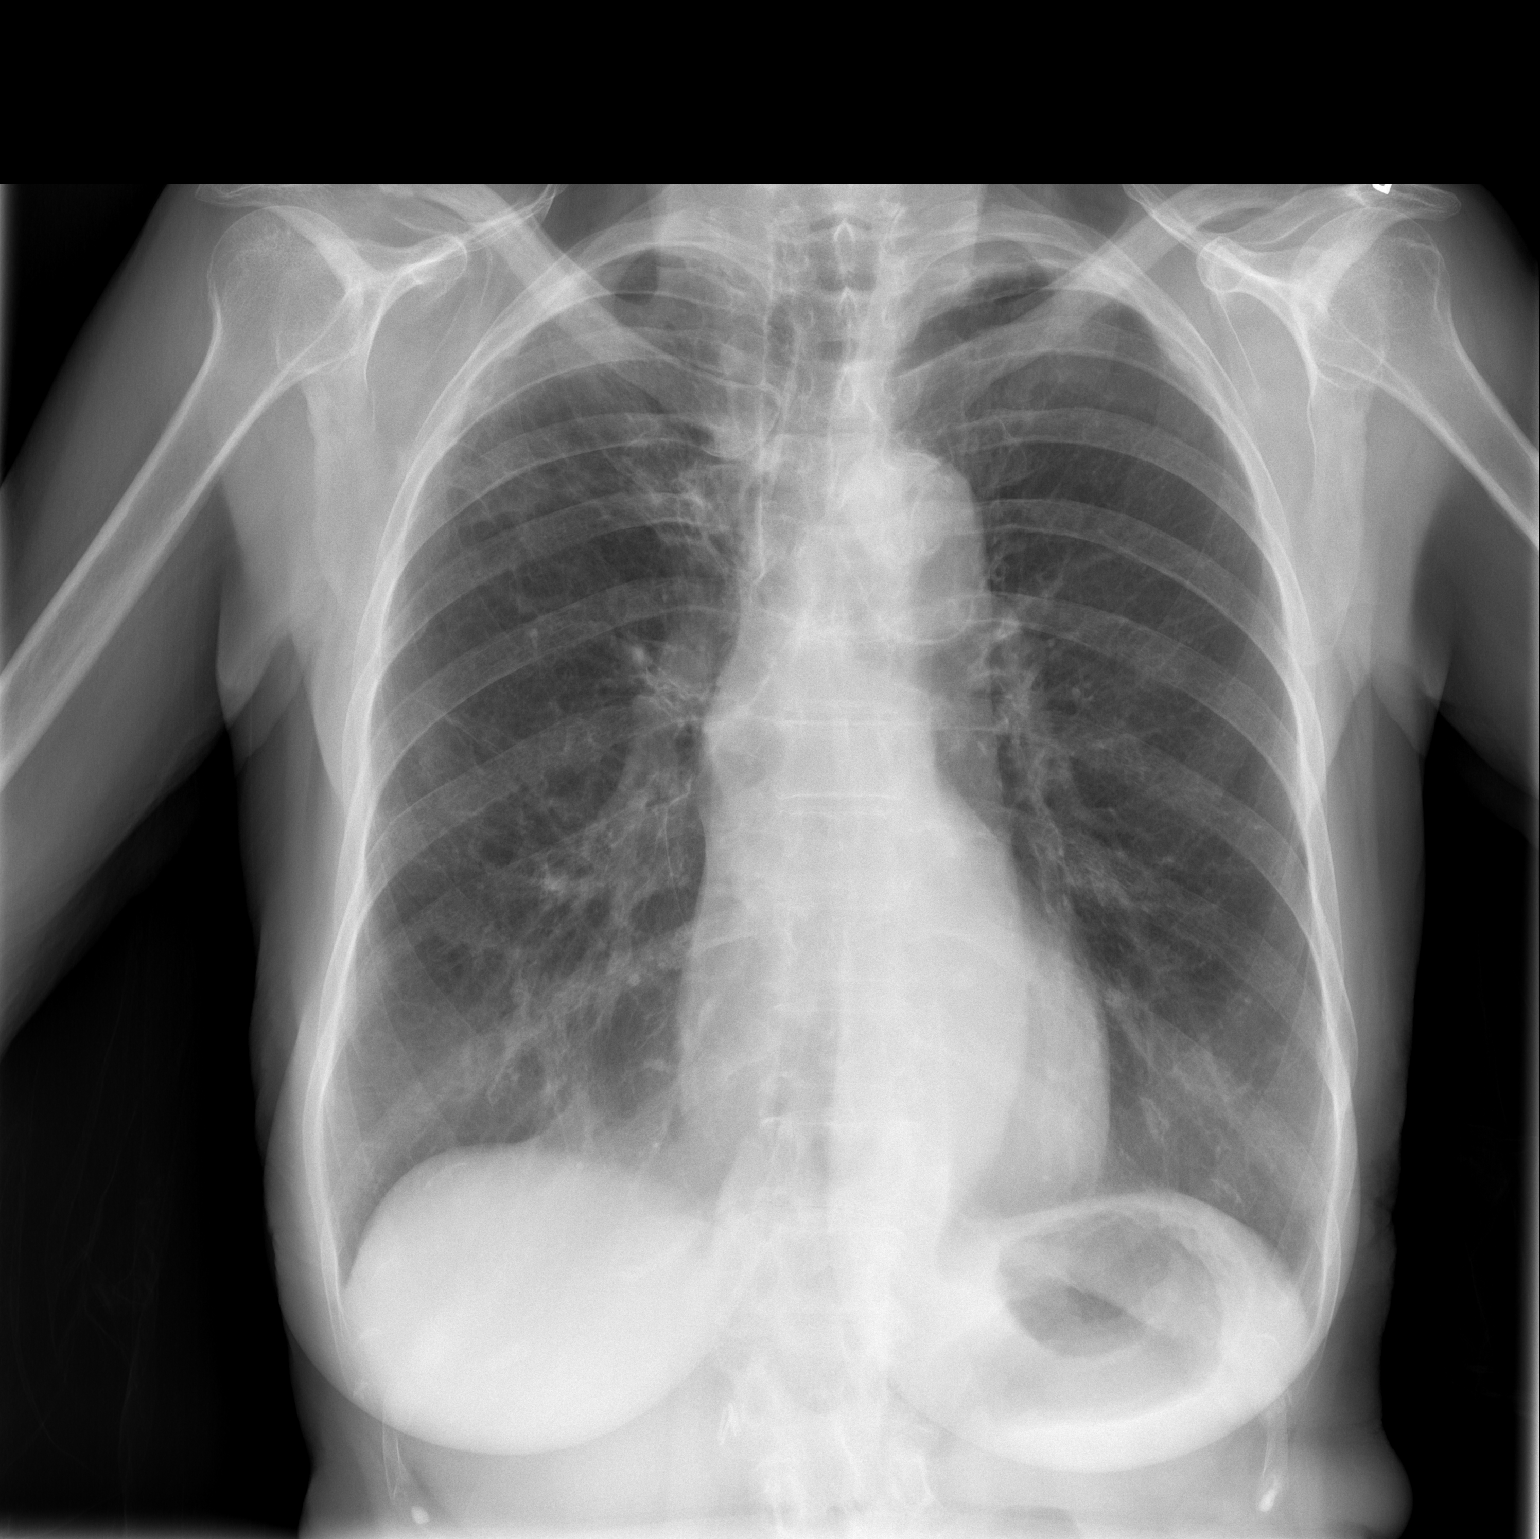

[w chest lat]
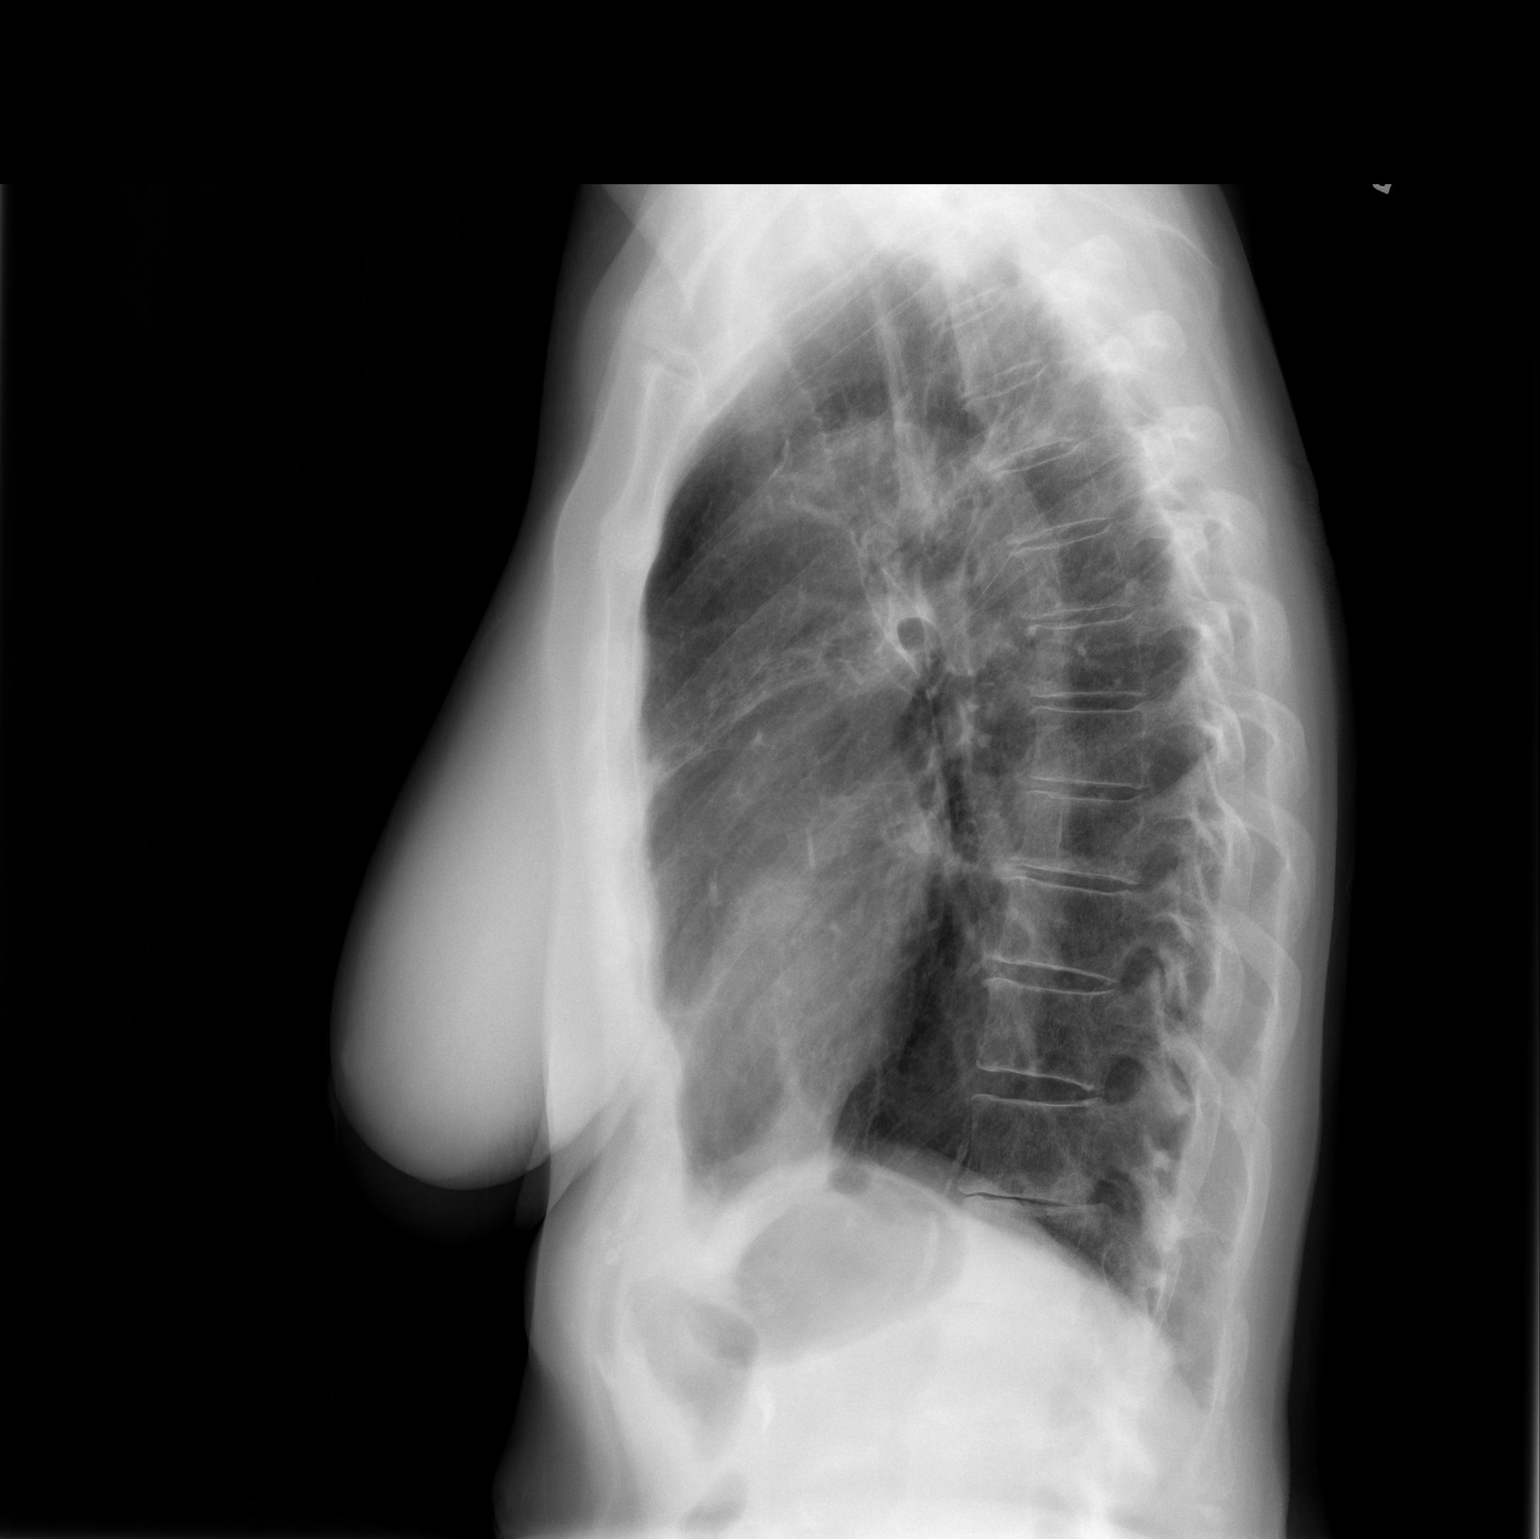

[2 of 2 positions shown; findings below may reference images not displayed]

FINDINGS: Patchy airspace disease in the right upper and right middle lobes
seen on the two most recent examinations has resolved. The lungs are
emphysematous. Heart size is normal. Aortic atherosclerosis is
noted. No pneumothorax or pleural fluid. No acute or focal bony
abnormality.
IMPRESSION: Resolved pneumonia.

Emphysema.

Atherosclerosis.

## 2019-10-01 ENCOUNTER — Encounter: Payer: Self-pay | Admitting: Pulmonary Disease

## 2019-10-01 ENCOUNTER — Ambulatory Visit (INDEPENDENT_AMBULATORY_CARE_PROVIDER_SITE_OTHER): Payer: Medicare Other | Admitting: Pulmonary Disease

## 2019-10-01 ENCOUNTER — Ambulatory Visit (INDEPENDENT_AMBULATORY_CARE_PROVIDER_SITE_OTHER)
Admission: RE | Admit: 2019-10-01 | Discharge: 2019-10-01 | Disposition: A | Discharge status: Home / Self Care | Payer: Medicare Other | Source: Ambulatory Visit | Attending: Pulmonary Disease | Admitting: Pulmonary Disease

## 2019-10-01 ENCOUNTER — Other Ambulatory Visit: Payer: Self-pay

## 2019-10-01 VITALS — BP 128/74 | HR 60 | Temp 97.9°F | Ht 65.0 in | Wt 107.6 lb

## 2019-10-01 DIAGNOSIS — R042 Hemoptysis: Secondary | ICD-10-CM | POA: Diagnosis not present

## 2019-10-01 DIAGNOSIS — G4734 Idiopathic sleep related nonobstructive alveolar hypoventilation: Secondary | ICD-10-CM

## 2019-10-01 DIAGNOSIS — J449 Chronic obstructive pulmonary disease, unspecified: Secondary | ICD-10-CM

## 2019-10-01 NOTE — Assessment & Plan Note (Signed)
Reviewed BAL data, no infectious organism isolated other than Candida glabrata which is likely contaminant Repeat chest x-ray today for resolution

## 2019-10-01 NOTE — Patient Instructions (Signed)
Chest x-ray today If bleeding recurs, stop taking Eliquis and call us

## 2019-10-01 NOTE — Progress Notes (Signed)
   Subjective:    Patient ID: Deborah Shields, female    DOB: February 19, 1941, 79 y.o.   MRN: [id number]  HPI  79 yo remote smokerwith COPD and recurrent hemoptysis  adm 06/2017 for hemoptysis Bronchoscopy only showed clot in the lingula bronchus without any clear etiology which cleared on follow-up evaluation She is a remote smoker and smoked about 20 pack years before she quit more than 25 years ago. PMH - fibromuscular dysplasia of bilateral carotid arteries, severe PAD, stage III CKD and atrial fibrillation  Hospital discharge summary reviewed 3/6-3/16, admitted with hemoptysis attributed to atypical pneumonia, treated with antibiotics, developed atrial fibrillation/RVR and eventually required pacemaker placement on 3/15 for pauses Bronchoscopy 3/8 showed clot in lingula and LUL bronchus with granulation tissue in lingula, brushings negative, BAL cultures negative except for Candida glabrata   Significant tests/ events reviewed CTA chest 3/6- lingular infiltrate Echo 3/9 > LVEF 55-60%, RVSP is normal.  No change from prior.  Spirometry 11/2017 showed moderate airway obstruction with ratio 61, FEV1 of 56% and FVC of 68%   06/2017 CT chest -suggestion of endobronchial lesion in the lingular bronchus -Mild bronchiectasis Bronchoscopy 07/17/17 >>Large blood clot in lingular bronchus which could not be fully aspirated. Rpt bscopy 08/2017 >> clot noted in the lingular bronchus was no longer seen  Review of Systems Patient denies significant dyspnea,cough, hemoptysis,  chest pain, palpitations, pedal edema, orthopnea, paroxysmal nocturnal dyspnea, lightheadedness, nausea, vomiting, abdominal or  leg pains      Objective:   Physical Exam  Gen. Pleasant, thin, frail, in no distress ENT - no thrush, no pallor/icterus,no post nasal drip Neck: No JVD, no thyromegaly, no carotid bruits Lungs: no use of accessory muscles, no dullness to percussion, decreased without rales or rhonchi    Cardiovascular: Rhythm regular, heart sounds  normal, no murmurs or gallops, no peripheral edema Musculoskeletal: No deformities, no cyanosis or clubbing        Assessment & Plan:

## 2019-10-01 NOTE — Assessment & Plan Note (Signed)
Recurrent hemoptysis in the setting of?  Viral pneumonia.  No other etiology found on repeated bronchoscopy She has now been started on Eliquis so there is a risk that this would happen again We discussed a plan should this occur again, if small amounts she can call the office if large amount would need to go to the emergency room.  Either case, she should stop her blood thinner if hemoptysis recurs

## 2019-10-01 NOTE — Assessment & Plan Note (Signed)
Again asymptomatic from dyspnea standpoint, so does not desire bronchodilators at this time

## 2019-10-04 ENCOUNTER — Other Ambulatory Visit: Payer: Self-pay

## 2019-10-04 MED ORDER — FLECAINIDE ACETATE 50 MG PO TABS: 50.0000 mg | ORAL_TABLET | Freq: Two times a day (BID) | ORAL | 0 refills | Status: DC

## 2019-10-05 ENCOUNTER — Telehealth: Payer: Self-pay | Admitting: Pulmonary Disease

## 2019-10-05 NOTE — Telephone Encounter (Signed)
Pt aware that the order has been cancelled

## 2019-10-05 NOTE — Telephone Encounter (Signed)
I have discontinued the HST order that had been placed. Attempted to call pt but unable to reach. Left message for pt to return call. When pt returns call, we just need to let her know that the HST was not needed per Dr. Elsworth Soho.

## 2019-10-05 NOTE — Telephone Encounter (Signed)
Do not need HST

## 2019-10-05 NOTE — Telephone Encounter (Signed)
Dr. Elsworth Soho did you want a HST? Please advise. I don't see in your last note that you wanted pt to have HST.

## 2019-10-08 ENCOUNTER — Other Ambulatory Visit: Payer: Self-pay

## 2019-10-12 LAB — ACID FAST CULTURE WITH REFLEXED SENSITIVITIES (MYCOBACTERIA): Acid Fast Culture: NEGATIVE

## 2019-10-21 LAB — ACID FAST CULTURE WITH REFLEXED SENSITIVITIES (MYCOBACTERIA): Acid Fast Culture: NEGATIVE

## 2019-11-04 ENCOUNTER — Other Ambulatory Visit: Payer: Self-pay

## 2019-11-04 ENCOUNTER — Telehealth: Payer: Self-pay | Admitting: Cardiovascular Disease

## 2019-11-04 MED ORDER — APIXABAN 2.5 MG PO TABS: 2.5000 mg | ORAL_TABLET | Freq: Two times a day (BID) | ORAL | 3 refills | Status: DC

## 2019-11-04 MED ORDER — FLECAINIDE ACETATE 50 MG PO TABS: 50.0000 mg | ORAL_TABLET | Freq: Two times a day (BID) | ORAL | 6 refills | Status: DC

## 2019-11-04 MED ORDER — APIXABAN 5 MG PO TABS: 5.0000 mg | ORAL_TABLET | Freq: Two times a day (BID) | ORAL | Status: DC

## 2019-11-04 NOTE — Telephone Encounter (Signed)
Patient wants to speak to nurse about her medication. She is in need of refills but wants to make sure Dr. Sallyanne Kuster is going to keep her on the same medication before she request the refills.

## 2019-11-04 NOTE — Telephone Encounter (Addendum)
Pt is asking for her Rx to be sent to [facility] and is asking for  a "tier reduction" ... she would like [place] to call and ask for this to get her a cheaper price from them for her Eliquis and Flecainide.   Their number [phone].  ID #[id number]  Spoke with Selena with [facility] and she started a Tier Exception Report which is required for both meds.   I was transferred to the [place] but Eliquis is not on her formulary and not eligible for tier reduction and can only get generic meds cheaper.   But, her Flecainide can be lowered to a tier 1.  Ref  [id number]  Will alert the the pt and see if she can continue to afford the Eliquis going forward.   Spoke with the pt and she still wants the Eliquis with the Flecainide [facility] into [facility]... I added the reference number for the lower tier on the Flecainide.

## 2019-11-08 ENCOUNTER — Other Ambulatory Visit: Payer: Self-pay | Admitting: Cardiovascular Disease

## 2019-11-08 NOTE — Telephone Encounter (Signed)
New Message    *STAT* If patient is at the pharmacy, call can be transferred to refill team.   1. Which medications need to be refilled? (please list name of each medication and dose if known) apixaban (ELIQUIS) 5 MG TABS tablet  And flecainide (TAMBOCOR) 50 MG tablet   2. Which pharmacy/location (including street and city if local pharmacy) is medication to be sent to?[id number] [place], [place] - [address]  3. Do they need a 30 day or 90 day supply? [place]

## 2019-11-09 ENCOUNTER — Other Ambulatory Visit: Payer: Self-pay

## 2019-11-10 ENCOUNTER — Other Ambulatory Visit: Payer: Self-pay

## 2019-11-10 NOTE — Telephone Encounter (Signed)
Follow-up:    Pt called back and said that her pharmacy has not gotten the new rx for these medications yet. The pt is out of medication and will need a new rx sent in asap.

## 2019-11-16 ENCOUNTER — Telehealth: Payer: Self-pay | Admitting: Cardiovascular Disease

## 2019-11-16 MED ORDER — FLECAINIDE ACETATE 50 MG PO TABS: 50.0000 mg | ORAL_TABLET | Freq: Two times a day (BID) | ORAL | 0 refills | Status: DC

## 2019-11-16 NOTE — Telephone Encounter (Signed)
Returned call to patient flecainide refill sent to pharmacy.Advised to keep appointment already scheduled with Dr.Croitoru 6/14 at 11:00 am.

## 2019-11-16 NOTE — Telephone Encounter (Signed)
New message   Patient needs a new prescription for flecainide (TAMBOCOR) 50 MG tablet sent to [facility] [phone] - [place], [address].    Patient is out of this medication.

## 2019-11-17 ENCOUNTER — Other Ambulatory Visit: Payer: Self-pay

## 2019-11-30 DIAGNOSIS — E441 Mild protein-calorie malnutrition: Secondary | ICD-10-CM | POA: Diagnosis not present

## 2019-11-30 DIAGNOSIS — E7800 Pure hypercholesterolemia, unspecified: Secondary | ICD-10-CM | POA: Diagnosis not present

## 2019-11-30 DIAGNOSIS — I700 Atherosclerosis of aorta: Secondary | ICD-10-CM | POA: Diagnosis not present

## 2019-11-30 DIAGNOSIS — J449 Chronic obstructive pulmonary disease, unspecified: Secondary | ICD-10-CM | POA: Diagnosis not present

## 2019-11-30 DIAGNOSIS — Z0000 Encounter for general adult medical examination without abnormal findings: Secondary | ICD-10-CM | POA: Diagnosis not present

## 2019-11-30 DIAGNOSIS — I129 Hypertensive chronic kidney disease with stage 1 through stage 4 chronic kidney disease, or unspecified chronic kidney disease: Secondary | ICD-10-CM | POA: Diagnosis not present

## 2019-11-30 DIAGNOSIS — Z79899 Other long term (current) drug therapy: Secondary | ICD-10-CM | POA: Diagnosis not present

## 2019-11-30 DIAGNOSIS — E039 Hypothyroidism, unspecified: Secondary | ICD-10-CM | POA: Diagnosis not present

## 2019-11-30 DIAGNOSIS — I495 Sick sinus syndrome: Secondary | ICD-10-CM | POA: Diagnosis not present

## 2019-11-30 DIAGNOSIS — N1832 Chronic kidney disease, stage 3b: Secondary | ICD-10-CM | POA: Diagnosis not present

## 2019-11-30 DIAGNOSIS — Z1389 Encounter for screening for other disorder: Secondary | ICD-10-CM | POA: Diagnosis not present

## 2019-11-30 DIAGNOSIS — M8580 Other specified disorders of bone density and structure, unspecified site: Secondary | ICD-10-CM | POA: Diagnosis not present

## 2019-11-30 DIAGNOSIS — I480 Paroxysmal atrial fibrillation: Secondary | ICD-10-CM | POA: Diagnosis not present

## 2019-12-06 ENCOUNTER — Ambulatory Visit (INDEPENDENT_AMBULATORY_CARE_PROVIDER_SITE_OTHER): Payer: Medicare Other | Admitting: Cardiovascular Disease

## 2019-12-06 ENCOUNTER — Encounter: Payer: Self-pay | Admitting: Cardiovascular Disease

## 2019-12-06 ENCOUNTER — Other Ambulatory Visit: Payer: Self-pay

## 2019-12-06 ENCOUNTER — Ambulatory Visit (INDEPENDENT_AMBULATORY_CARE_PROVIDER_SITE_OTHER): Payer: Medicare Other | Admitting: *Deleted

## 2019-12-06 VITALS — BP 146/100 | HR 68 | Ht 66.0 in | Wt 109.0 lb

## 2019-12-06 DIAGNOSIS — I495 Sick sinus syndrome: Secondary | ICD-10-CM

## 2019-12-06 DIAGNOSIS — R636 Underweight: Secondary | ICD-10-CM | POA: Insufficient documentation

## 2019-12-06 DIAGNOSIS — Z950 Presence of cardiac pacemaker: Secondary | ICD-10-CM | POA: Insufficient documentation

## 2019-12-06 DIAGNOSIS — I480 Paroxysmal atrial fibrillation: Secondary | ICD-10-CM | POA: Diagnosis not present

## 2019-12-06 DIAGNOSIS — Z7901 Long term (current) use of anticoagulants: Secondary | ICD-10-CM | POA: Diagnosis not present

## 2019-12-06 DIAGNOSIS — E7800 Pure hypercholesterolemia, unspecified: Secondary | ICD-10-CM | POA: Diagnosis not present

## 2019-12-06 DIAGNOSIS — E039 Hypothyroidism, unspecified: Secondary | ICD-10-CM

## 2019-12-06 LAB — PACEMAKER DEVICE OBSERVATION

## 2019-12-06 MED ORDER — METOPROLOL SUCCINATE ER 50 MG PO TB24: 50.0000 mg | ORAL_TABLET | Freq: Every day | ORAL | 2 refills | Status: DC

## 2019-12-06 MED ORDER — FLECAINIDE ACETATE 50 MG PO TABS: 50.0000 mg | ORAL_TABLET | Freq: Two times a day (BID) | ORAL | 2 refills | Status: DC

## 2019-12-06 NOTE — Patient Instructions (Addendum)
Medication Instructions:  INCREASE the Metoprolol Succinate to 50 mg once daily If you need a refill on your cardiac medications before your next appointment, please call your pharmacy.   Lab work: None ordered If you have labs (blood work) drawn today and your tests are completely normal, you will receive your results only by: [place] (if you have MyChart) OR A paper copy in the mail If you have any lab test that is abnormal or we need to change your treatment, we will call you to review the results.  Testing/Procedures: None ordered  Follow-Up: At [facility], you and your health needs are our priority.  As part of our continuing mission to provide you with exceptional heart care, we have created designated Provider Care Teams.  These Care Teams include your primary Cardiologist (physician) and Advanced Practice Providers (APPs -  Physician Assistants and Nurse Practitioners) who all work together to provide you with the care you need, when you need it.  We recommend signing up for the patient portal called "MyChart".  Sign up information is provided on this After Visit Summary.  MyChart is used to connect with patients for Virtual Visits (Telemedicine).  Patients are able to view lab/test results, encounter notes, upcoming appointments, etc.  Non-urgent messages can be sent to your provider as well.   To learn more about what you can do with MyChart, go to [email or web address].    Your next appointment:   3 month(s) on pacer day  The format for your next appointment:   In Person  Provider:   Sanda Klein, MD   Remote monitoring is used to monitor your pacemaker from home. This monitoring reduces the number of office visits required to check your device to one time per year. It allows [place] to keep an eye on the functioning of your device to ensure it is working properly. You are scheduled for a device check from home on 03/06/20. You may send your transmission at any time  that day. If you have a wireless device, the transmission will be sent automatically.

## 2019-12-06 NOTE — Progress Notes (Signed)
Cardiology Office Note:    Date:  12/06/2019   ID:  Deborah Shields, [place] 12-02-1940, MRN [id number]  PCP:  Deborah Manes, MD  [facility] Cardiologist:  Sanda Klein, MD  [facility] Electrophysiologist:  None   Referring MD: Deborah Manes, MD   Chief Complaint  Patient presents with  . Follow-up  Pacemaker implantation  History of Present Illness:    Deborah Shields is a 79 y.o. female with a hx of symptomatic paroxysmal atrial fibrillation, history of bilateral carotid artery disease due to fibromuscular dysplasia, hypertension, hyperlipidemia, treated hypothyroidism and CKD stage III, recently developing long posttermination pauses following atrial fibrillation (in the setting of bronchoscopy performed for pneumonia complicated by hemoptysis).  She has normal left ventricular systolic function.  She underwent implantation of a left subclavian dual-chamber St Jude permanent pacemaker by Dr. Cristopher Peru on September 06, 2019.  The surgical site has healed nicely.  She has not had any new syncopal events since device implantation.  She has not been aware of palpitations.  Her pacemaker has recorded several more episodes of arrhythmia including an episode of 7.5 hours of atrial fibrillation with rapid ventricular response on May 16 (ventricular rates as high as 181 bpm), as well as a few other shorter episodes of more organized arrhythmia suggesting atrial flutter.  The overall burden of arrhythmia remains low, under 1%.  She has 31% atrial pacing and has not required ventricular pacing.  Lead parameters are excellent.  Check today the atrial lead pacing threshold is 1 V at 0.5 ms, sensing P waves 2.9 mV, impedance 400 ohms; ventricular lead threshold 0.75 V at 0.5 ms, sensed R waves 6.6 mV, impedance 590 ohms.  Lead outputs were decreased to chronic levels today and the estimated generator longevity after those changes is 8.5-11.1 years.  She is currently taking flecainide 50  mg twice daily and a low-dose of metoprolol succinate 25 mg daily.  She has been compliant with apixaban anticoagulation without bleeding complications.  She generally feels well.  She denies dyspnea at rest or with activity, orthopnea, PND, edema, claudication or focal neurological events.  She does complain of fatigue.  She is slowly gaining back some weight.  Past Medical History:  Diagnosis Date  . Anxiety   . Aortic insufficiency    mild due to degenerative changes  . Arthritis   . Basal cell carcinoma 2007   [facility] Left leg  . Carotid artery occlusion   . Chronic kidney disease    CRD Stage 3  . Conjunctivitis due to adenovirus, both eyes   . Degenerative joint disease   . Diverticulosis of colon   . Dyspnea    with exertion  . Heart murmur   . Hyperlipidemia    NMR 07/2009: LDL 200 (2260/1233)TG 99, HDL 65. LDL goal =<120, ideally <100. father MI @ 67  . Hypertension   . Hypothyroidism    Dr Wilson Singer  . Microscopic hematuria   . Peripheral neuropathy    compressive in UE bilaterally; Dr Daylene Katayama  . Peripheral vascular disease ([place])    ICA bilat, Dr.Charles Shields, [facility]  . Pneumonia    2017  . PONV (postoperative nausea and vomiting)    "Inner ear, does  okay with Scopolamine"  . Pulmonary embolus ([place])   . Rectocele   . [place] spotted fever     Past Surgical History:  Procedure Laterality Date  . ABDOMINAL HYSTERECTOMY  1973   age 36 due to dysfuctional  menses; HRT x 25-30 years  . APPENDECTOMY  1952  . basal cell cancer  03/2006   leg  . BILATERAL OOPHORECTOMY  1990   prophylactically (sister had ovarian ca)  . BRONCHIAL BRUSHINGS  08/30/2019   Procedure: BRONCHIAL BRUSHINGS;  Surgeon: Deborah Furbish, MD;  Location: [facility];  Service: Endoscopy;;  . BRONCHIAL WASHINGS  08/30/2019   Procedure: BRONCHIAL WASHINGS;  Surgeon: Deborah Furbish, MD;  Location: [facility];  Service: Endoscopy;;  . CARPAL TUNNEL RELEASE Bilateral 1989   right  .  CATARACT EXTRACTION, BILATERAL  2010   Dr Deborah Shields  . CHOLECYSTECTOMY  2006  . COLONOSCOPY     Dr Deborah Shields  . EYE SURGERY Left    Bilateral eye (film removed lt eye 01/09/17)  . LUMBAR LAMINECTOMY/DECOMPRESSION MICRODISCECTOMY Left 03/20/2017   Procedure: LEFT LUMBAR FOURLUMBAR FIVE LAMINOTOMY AND MICROICRODISCECTOMY 1 LEVEL;  Surgeon: Deborah Gamma, MD;  Location: [place];  Service: Neurosurgery;  Laterality: Left;  LEFT  . NM MYOVIEW LTD  06/13/2008   low risk scan  . ORTHOPEDIC SURGERY  1989/200/2002/2012   elbows,shoulder surgery x 3, right hand  . PACEMAKER IMPLANT N/A 09/06/2019   Procedure: PACEMAKER IMPLANT;  Surgeon: Deborah Lance, MD;  Location: [facility];  Service: Cardiovascular;  Laterality: N/A;  . RECTOCELE REPAIR  2016  . TONSILLECTOMY  1957  . US ECHOCARDIOGRAPHY  05/01/2010   trace MR,AI,TR;EF =>55%  . VIDEO BRONCHOSCOPY Bilateral 07/17/2017   Procedure: VIDEO BRONCHOSCOPY WITHOUT FLUORO;  Surgeon: Deborah Noel, MD;  Location: [facility];  Service: Cardiopulmonary;  Laterality: Bilateral;  . VIDEO BRONCHOSCOPY Bilateral 09/02/2017   Procedure: VIDEO BRONCHOSCOPY WITHOUT FLUORO;  Surgeon: Deborah Noel, MD;  Location: [facility];  Service: Cardiopulmonary;  Laterality: Bilateral;  . VIDEO BRONCHOSCOPY N/A 08/30/2019   Procedure: VIDEO BRONCHOSCOPY WITHOUT FLUORO;  Surgeon: Deborah Furbish, MD;  Location: [facility];  Service: Endoscopy;  Laterality: N/A;    Current Medications: Current Meds  Medication Sig  . apixaban (ELIQUIS) 5 MG TABS tablet Take 1 tablet (5 mg total) by mouth 2 (two) times daily. (Patient taking differently: Take 5 mg by mouth 2 (two) times daily. Takes 2.5)  . b complex vitamins tablet Take 1 tablet by mouth daily after supper.   . cholecalciferol (VITAMIN D3) 25 MCG (1000 UNIT) tablet Take 1,000 Units by mouth daily.  . cyclobenzaprine (FLEXERIL) 10 MG tablet Take 10 mg by mouth 2 (two) times daily as needed for muscle spasms  (scheduled every every).   Marland Kitchen diclofenac sodium (VOLTAREN) 1 % GEL Apply 2-4 g topically 4 (four) times daily as needed (for pain).   . flecainide (TAMBOCOR) 50 MG tablet Take 1 tablet (50 mg total) by mouth every 12 (twelve) hours.  Marland Kitchen levothyroxine (SYNTHROID) 75 MCG tablet Take 1 tablet (75 mcg total) by mouth daily before breakfast. Taking the brand "synthroid"  . LORazepam (ATIVAN) 0.5 MG tablet Take 1 tablet (0.5 mg total) by mouth 3 (three) times daily as needed. (Patient taking differently: Take 0.5 mg by mouth at bedtime. )  . meclizine (ANTIVERT) 25 MG tablet Take 1 tablet (25 mg total) by mouth daily. 1/2 every 8 hrs prn for imbalance (Patient taking differently: Take 12.5-25 mg by mouth 2 (two) times daily as needed for dizziness. )  . metoprolol succinate (TOPROL-XL) 50 MG 24 hr tablet Take 1 tablet (50 mg total) by mouth daily.  Marland Kitchen senna (SENOKOT) 8.6 MG tablet Take 1 tablet by mouth every other  day.   . traMADol (ULTRAM) 50 MG tablet 1/2-1 by mouth once daily as needed. (Patient taking differently: Take 25-50 mg by mouth 2 (two) times daily as needed for moderate pain or severe pain (1 tablet by mouth scheduled each bedtime.). )  . [DISCONTINUED] flecainide (TAMBOCOR) 50 MG tablet Take 1 tablet (50 mg total) by mouth every 12 (twelve) hours.  . [DISCONTINUED] guaiFENesin-dextromethorphan (ROBITUSSIN DM) 100-10 MG/5ML syrup Take 5 mLs by mouth every 4 (four) hours as needed for cough.  . [DISCONTINUED] metoprolol succinate (TOPROL-XL) 25 MG 24 hr tablet Take 1 tablet (25 mg total) by mouth daily.     Allergies:   Aspirin, Lovastatin, Benazepril hcl, Paroxetine, Tape, Bupropion hcl, Ezetimibe, Fenofibrate, and Pravastatin sodium   Social History   Socioeconomic History  . Marital status: Widowed    Spouse name: Not on file  . Number of children: 1  . Years of education: Not on file  . Highest education level: Not on file  Occupational History  . Occupation: retired  Tobacco Use   . Smoking status: Never Smoker  . Smokeless tobacco: Never Used  Vaping Use  . Vaping Use: Never used  Substance and Sexual Activity  . Alcohol use: No    Alcohol/week: 0.0 standard drinks  . Drug use: No  . Sexual activity: Not on file  Other Topics Concern  . Not on file  Social History Narrative   Low cholesterol diet   Regular exercise- yes    Social Determinants of Health   Financial Resource Strain:   . Difficulty of Paying Living Expenses:   Food Insecurity:   . Worried About Charity fundraiser in the Last Year:   . Arboriculturist in the Last Year:   Transportation Needs:   . Film/video editor (Medical):   Marland Kitchen Lack of Transportation (Non-Medical):   Physical Activity:   . Days of Exercise per Week:   . Minutes of Exercise per Session:   Stress:   . Feeling of Stress :   Social Connections:   . Frequency of Communication with Friends and Family:   . Frequency of Social Gatherings with Friends and Family:   . Attends Religious Services:   . Active Member of Clubs or Organizations:   . Attends Archivist Meetings:   Marland Kitchen Marital Status:      Family History: The patient's family history includes Cancer - Colon (age of onset: 30) in her mother; Colon cancer (age of onset: 19) in her mother; Heart attack (age of onset: 82) in her father; Heart disease in her brother, father, and sister; Kidney disease in her mother; Other in her sister; Ovarian cancer in her sister; Stroke (age of onset: 37) in her mother. There is no history of Diabetes or Breast cancer.  ROS:   Please see the history of present illness.     All other systems reviewed and are negative.  EKGs/Labs/Other Studies Reviewed:    The following studies were reviewed today: Pacemaker implantation operative report 09/06/2019 Echocardiogram 08/31/2019 1. Left ventricular ejection fraction, by estimation, is 55 to 60%. The  left ventricle has normal function. The left ventricle has no regional   wall motion abnormalities. Left ventricular diastolic parameters were  normal.  2. Right ventricular systolic function is normal. The right ventricular  size is normal. There is normal pulmonary artery systolic pressure. The  estimated right ventricular systolic pressure is 53.2 mmHg.  3. The mitral valve is normal in structure.  No evidence of mitral valve  regurgitation.  4. The aortic valve is tricuspid. Aortic valve regurgitation is mild to  moderate. Mild aortic valve stenosis. Aortic valve area, by VTI measures  1.42 cm. Aortic valve mean gradient measures 11.0 mmHg. Aortic valve Vmax  measures 2.27 m/s.  5. The inferior vena cava is normal in size with greater than 50%  respiratory variability, suggesting right atrial pressure of 3 mmHg.   EKG:  EKG is not ordered today.  The intracardiac electrogram today shows atrial sensed, ventricular sensed rhythm. Recent Labs: 08/28/2019: ALT 15 08/30/2019: TSH 0.262 09/06/2019: Hemoglobin 11.8; Magnesium 2.3; Platelets 305 09/07/2019: BUN 16; Creatinine, Ser 1.60; Potassium 3.8; Sodium 137  Recent Lipid Panel    Component Value Date/Time   CHOL 239 (H) 08/04/2014 1109   TRIG 135.0 08/04/2014 1109   TRIG 77 05/21/2006 1321   HDL 57.70 08/04/2014 1109   CHOLHDL 4 08/04/2014 1109   VLDL 27.0 08/04/2014 1109   LDLCALC 154 (H) 08/04/2014 1109   LDLDIRECT 169.9 01/16/2011 0854    11/30/2019 Total cholesterol 268, HDL 58, LDL 181, triglycerides 157 Hemoglobin 12.4, creatinine 1.7, potassium 4.6 TSH 1.100  Physical Exam:    VS:  BP (!) 146/100 (BP Location: Left Arm, Patient Position: Sitting, Cuff Size: Normal)   Pulse 68   Ht 5\' 6"  (1.676 m)   Wt 109 lb (49.4 kg)   BMI 17.59 kg/m     Wt Readings from Last 3 Encounters:  12/06/19 109 lb (49.4 kg)  10/01/19 107 lb 9.6 oz (48.8 kg)  09/07/19 106 lb 4.2 oz (48.2 kg)     GEN: Underweight well nourished, well developed in no acute distress HEENT: Normal NECK: No JVD; No carotid  bruits LYMPHATICS: No lymphadenopathy CARDIAC: Left subclavian pacemaker scar well-healed, RRR, no murmurs, rubs, gallops RESPIRATORY:  Clear to auscultation without rales, wheezing or rhonchi  ABDOMEN: Soft, non-tender, non-distended MUSCULOSKELETAL:  No edema; No deformity  SKIN: Warm and dry NEUROLOGIC:  Alert and oriented x 3 PSYCHIATRIC:  Normal affect   ASSESSMENT:    1. Paroxysmal atrial fibrillation (HCC)   2. Long term current use of anticoagulant   3. SSS (sick sinus syndrome) ([place])   4. Pacemaker   5. Hypercholesterolemia   6. Underweight   7. Hypothyroidism (acquired)    PLAN:    In order of problems listed above:  1. Afib: The overall burden of arrhythmia is low, but she still has significant episodes of RVR.  Increase metoprolol succinate to 50 mg once daily.  Continue flecainide.  On appropriate anticoagulation with Eliquis.  CHA2DS2-VASc 5 (age 58, gender, hypertension, peripheral arterial disease). 2. Anticoagulation: Without bleeding complications 3. SSS: Manifest as posttermination pauses leading to syncope/near syncope.  Only has 30% atrial pacing and does not require ventricular pacing.  Heart rate histogram distribution is appropriate. 4. PM: Normal device function.  Lead outputs decreased to chronic levels.  We will see her back in the clinic again in 3 months and then plan remote downloads every 3 months. 5. HLP: Intolerant to statins, ezetimibe, WelChol.  She has never had any true vascular events.  Her carotid stenosis was felt to be secondary to fibromuscular dysplasia.  Currently not on lipid-lowering medications, but can consider PCSK9 inhibitors.  6. Underweight: At this point with focus on highly protein high calorie food intake to bring her back to a BMI of at least 19.  Note that her TSH was mildly suppressed at labs in March, but is currently  normal.  This likely represented sick euthyroid syndrome.  Appears to be an appropriate dose of levothyroxine.     Medication Adjustments/Labs and Tests Ordered: Current medicines are reviewed at length with the patient today.  Concerns regarding medicines are outlined above.  Orders Placed This Encounter  Procedures  . EKG 12-Lead   Meds ordered this encounter  Medications  . flecainide (TAMBOCOR) 50 MG tablet    Sig: Take 1 tablet (50 mg total) by mouth every 12 (twelve) hours.    Dispense:  60 tablet    Refill:  2  . metoprolol succinate (TOPROL-XL) 50 MG 24 hr tablet    Sig: Take 1 tablet (50 mg total) by mouth daily.    Dispense:  30 tablet    Refill:  2    Patient Instructions  Medication Instructions:  INCREASE the Metoprolol Succinate to 50 mg once daily If you need a refill on your cardiac medications before your next appointment, please call your pharmacy.   Lab work: None ordered If you have labs (blood work) drawn today and your tests are completely normal, you will receive your results only by: [place] (if you have MyChart) OR A paper copy in the mail If you have any lab test that is abnormal or we need to change your treatment, we will call you to review the results.  Testing/Procedures: None ordered  Follow-Up: At [facility], you and your health needs are our priority.  As part of our continuing mission to provide you with exceptional heart care, we have created designated Provider Care Teams.  These Care Teams include your primary Cardiologist (physician) and Advanced Practice Providers (APPs -  Physician Assistants and Nurse Practitioners) who all work together to provide you with the care you need, when you need it.  We recommend signing up for the patient portal called "MyChart".  Sign up information is provided on this After Visit Summary.  MyChart is used to connect with patients for Virtual Visits (Telemedicine).  Patients are able to view lab/test results, encounter notes, upcoming appointments, etc.  Non-urgent messages can be sent to your provider as  well.   To learn more about what you can do with MyChart, go to [email or web address].    Your next appointment:   3 month(s) on pacer day  The format for your next appointment:   In Person  Provider:   Sanda Klein, MD   Remote monitoring is used to monitor your pacemaker from home. This monitoring reduces the number of office visits required to check your device to one time per year. It allows [place] to keep an eye on the functioning of your device to ensure it is working properly. You are scheduled for a device check from home on 03/06/20. You may send your transmission at any time that day. If you have a wireless device, the transmission will be sent automatically.      Signed, Sanda Klein, MD  12/06/2019 2:10 PM    [place]

## 2019-12-07 LAB — CUP PACEART REMOTE DEVICE CHECK
Battery Remaining Longevity: 88 mo
Battery Remaining Percentage: 95.5 %
Battery Voltage: 3.02 V
Brady Statistic AP VP Percent: 1 %
Brady Statistic AP VS Percent: 32 %
Brady Statistic AS VP Percent: 1 %
Brady Statistic AS VS Percent: 68 %
Brady Statistic RA Percent Paced: 31 %
Brady Statistic RV Percent Paced: 1 %
Date Time Interrogation Session: 20210614020013
Implantable Lead Implant Date: 20210315
Implantable Lead Implant Date: 20210315
Implantable Lead Location: 753859
Implantable Lead Location: 753860
Implantable Pulse Generator Implant Date: 20210315
Lead Channel Impedance Value: 410 Ohm
Lead Channel Impedance Value: 590 Ohm
Lead Channel Pacing Threshold Amplitude: 0.75 V
Lead Channel Pacing Threshold Amplitude: 1 V
Lead Channel Pacing Threshold Pulse Width: 0.5 ms
Lead Channel Pacing Threshold Pulse Width: 0.5 ms
Lead Channel Sensing Intrinsic Amplitude: 2.4 mV
Lead Channel Sensing Intrinsic Amplitude: 6.6 mV
Lead Channel Setting Pacing Amplitude: 3.5 V
Lead Channel Setting Pacing Amplitude: 3.5 V
Lead Channel Setting Pacing Pulse Width: 0.5 ms
Lead Channel Setting Sensing Sensitivity: 2 mV
Pulse Gen Model: 2272
Pulse Gen Serial Number: 3805113

## 2019-12-07 NOTE — Progress Notes (Signed)
Remote pacemaker transmission.   

## 2019-12-09 DIAGNOSIS — N183 Chronic kidney disease, stage 3 unspecified: Secondary | ICD-10-CM | POA: Diagnosis not present

## 2019-12-09 DIAGNOSIS — R7303 Prediabetes: Secondary | ICD-10-CM | POA: Diagnosis not present

## 2019-12-31 DIAGNOSIS — C569 Malignant neoplasm of unspecified ovary: Secondary | ICD-10-CM | POA: Diagnosis not present

## 2019-12-31 DIAGNOSIS — D649 Anemia, unspecified: Secondary | ICD-10-CM | POA: Diagnosis not present

## 2019-12-31 DIAGNOSIS — R1032 Left lower quadrant pain: Secondary | ICD-10-CM | POA: Diagnosis not present

## 2019-12-31 DIAGNOSIS — N390 Urinary tract infection, site not specified: Secondary | ICD-10-CM | POA: Diagnosis not present

## 2019-12-31 DIAGNOSIS — E876 Hypokalemia: Secondary | ICD-10-CM | POA: Diagnosis not present

## 2020-01-01 DIAGNOSIS — N390 Urinary tract infection, site not specified: Secondary | ICD-10-CM | POA: Diagnosis not present

## 2020-01-01 DIAGNOSIS — D649 Anemia, unspecified: Secondary | ICD-10-CM | POA: Diagnosis not present

## 2020-01-01 DIAGNOSIS — R1032 Left lower quadrant pain: Secondary | ICD-10-CM | POA: Diagnosis not present

## 2020-01-01 DIAGNOSIS — C569 Malignant neoplasm of unspecified ovary: Secondary | ICD-10-CM | POA: Diagnosis not present

## 2020-01-01 DIAGNOSIS — E876 Hypokalemia: Secondary | ICD-10-CM | POA: Diagnosis not present

## 2020-01-21 ENCOUNTER — Telehealth: Payer: Self-pay | Admitting: Cardiovascular Disease

## 2020-01-21 NOTE — Telephone Encounter (Signed)
Deborah Shields is calling requesting to speak with Deborah Shields in regards to a dental procedure she has scheduled for Tuesday, 01/25/20 that is 3 hours long. She states she was advised to not have dental work for 3 months and is wanting to know if she is clear to have the appointment. She is requesting a callback today so she can inform the dentist office. Please advise.

## 2020-01-21 NOTE — Telephone Encounter (Signed)
Returned the call to the patient. She stated that she is scheduled to have 5 teeth extracted on Tuesday. This will be a 3 hour process. She is currently taking Eliquis 2.5 mg twice daily.  The patient has been advised that she may need to hold her Eliquis but the office was currently closed so we will give her advice on Monday. She stated that her nephew is her dentist and she would be fine to postpone the procedure if needed.

## 2020-01-23 NOTE — Telephone Encounter (Signed)
I called Deborah Shields and told her it's OK to hold the Eliquis for 48 hours (starting tonight) before the Tuesday extraction. Restart when instructed by her dentist, probably the next morning after the procedure.

## 2020-03-02 ENCOUNTER — Encounter: Payer: Self-pay | Admitting: Cardiovascular Disease

## 2020-03-02 NOTE — Telephone Encounter (Signed)
error 

## 2020-03-06 ENCOUNTER — Ambulatory Visit (INDEPENDENT_AMBULATORY_CARE_PROVIDER_SITE_OTHER): Payer: Medicare Other | Admitting: *Deleted

## 2020-03-06 ENCOUNTER — Other Ambulatory Visit: Payer: Self-pay | Admitting: Cardiovascular Disease

## 2020-03-06 DIAGNOSIS — I495 Sick sinus syndrome: Secondary | ICD-10-CM

## 2020-03-06 LAB — CUP PACEART REMOTE DEVICE CHECK
Battery Remaining Longevity: 107 mo
Battery Remaining Percentage: 95.5 %
Battery Voltage: 3.01 V
Brady Statistic AP VP Percent: 1 %
Brady Statistic AP VS Percent: 82 %
Brady Statistic AS VP Percent: 1 %
Brady Statistic AS VS Percent: 18 %
Brady Statistic RA Percent Paced: 82 %
Brady Statistic RV Percent Paced: 1 %
Date Time Interrogation Session: 20210913045527
Implantable Lead Implant Date: 20210315
Implantable Lead Implant Date: 20210315
Implantable Lead Location: 753859
Implantable Lead Location: 753860
Implantable Pulse Generator Implant Date: 20210315
Lead Channel Impedance Value: 410 Ohm
Lead Channel Impedance Value: 590 Ohm
Lead Channel Pacing Threshold Amplitude: 0.75 V
Lead Channel Pacing Threshold Amplitude: 1.25 V
Lead Channel Pacing Threshold Pulse Width: 0.5 ms
Lead Channel Pacing Threshold Pulse Width: 0.5 ms
Lead Channel Sensing Intrinsic Amplitude: 3.2 mV
Lead Channel Sensing Intrinsic Amplitude: 6.3 mV
Lead Channel Setting Pacing Amplitude: 2.5 V
Lead Channel Setting Pacing Amplitude: 2.5 V
Lead Channel Setting Pacing Pulse Width: 0.5 ms
Lead Channel Setting Sensing Sensitivity: 2 mV
Pulse Gen Model: 2272
Pulse Gen Serial Number: 3805113

## 2020-03-08 ENCOUNTER — Telehealth: Payer: Self-pay | Admitting: Cardiovascular Disease

## 2020-03-08 NOTE — Progress Notes (Signed)
Remote pacemaker transmission.   

## 2020-03-08 NOTE — Telephone Encounter (Signed)
   [facility] Pre-operative Risk Assessment      Request for surgical clearance:  1. What type of surgery is being performed? Dental extraction - 5 teeth   2. When is this surgery scheduled? 03/31/2020   3. What type of clearance is required (medical clearance vs. Pharmacy clearance to hold med vs. Both)? Both  4. Are there any medications that need to be held prior to surgery and how long? On Eliquis   5. Practice name and name of physician performing surgery? [facility]   6. What is the office phone number? [phone]   7.   What is the office fax number? [phone]  8.   Anesthesia type (None, local, MAC, general) ? Not specified    Deborah Shields 03/08/2020, 3:29 PM  _________________________________________________________________   (provider comments below)

## 2020-03-09 NOTE — Telephone Encounter (Signed)
Patient with diagnosis of afib on Eliquis for anticoagulation.    Procedure: 5 dental extractions Date of procedure: 03/31/20  CHADS2-VASc score of 5 (age x2, sex, HTN, CAD/PVD), also with history of PE.  CrCl 83mL/min Platelet count 305K  Per office protocol, patient can hold Eliquis for 2 days prior to procedure as previously recommended by Dr Sallyanne Kuster for prior 5 extractions.

## 2020-03-09 NOTE — Telephone Encounter (Signed)
   Primary Cardiologist: Sanda Klein, MD  Chart reviewed as part of pre-operative protocol coverage.   Left a voicemail for patient to call back for ongoing preoperative assessment.  Abigail Butts, PA-C 03/09/2020, 8:41 AM

## 2020-03-09 NOTE — Telephone Encounter (Signed)
   Primary Cardiologist: Sanda Klein, MD  Chart reviewed as part of pre-operative protocol coverage. Patient was contacted 03/09/2020 in reference to pre-operative risk assessment for pending surgery as outlined below.  Deborah Shields was last seen on 12/06/19 by Dr. Sallyanne Kuster for follow-up of her paroxysmal atrial fibrillation, SSS s/p PPM, and HLD.  Since that day, Deborah Shields has done well from a cardiac standpoint. She can complete 4 METs without anginal complaints.  Therefore, based on ACC/AHA guidelines, the patient would be at acceptable risk for the planned procedure without further cardiovascular testing.   The patient was advised that if she develops new symptoms prior to surgery to contact our office to arrange for a follow-up visit, and she verbalized understanding.  Per pharmacy recommendations, patient can hold eliquis 2 days prior to her dental extractions with plans to restart as soon as she is cleared to do so by her dentist.   I will route this recommendation to the requesting party via [place] fax function and remove from pre-op pool. Please call with questions.  Abigail Butts, PA-C 03/09/2020, 8:48 AM

## 2020-03-16 NOTE — Telephone Encounter (Signed)
Patient returning call. She states that this may be an old call because she said [place] [facility]. Called her. She states that they are working on telephone lines down the road and things could have been messed up. Please advise.

## 2020-03-20 ENCOUNTER — Encounter: Payer: Medicare Other | Admitting: Cardiovascular Disease

## 2020-03-21 ENCOUNTER — Telehealth: Payer: Self-pay | Admitting: Cardiovascular Disease

## 2020-03-21 NOTE — Telephone Encounter (Signed)
Follow up:    Patient calling to check the status of her medical clearance.

## 2020-03-21 NOTE — Telephone Encounter (Signed)
Returned call to patient but did not get an answer and no voicemail is set up. Looks like in previous phone note, clearance has already been approved.

## 2020-03-21 NOTE — Telephone Encounter (Signed)
Made 2nd attempt to reach patient but did not get an answer.

## 2020-03-22 NOTE — Telephone Encounter (Signed)
I s/w the pt today and she is aware that she is cleared for dental procedure. Pt states her dentist is aware as well that she has been cleared. I see there is a note that the pt wanted her clearance to be mailed to her as well. I will put notes in the mail for the pt. Pt thanked me for the call today.

## 2020-04-03 ENCOUNTER — Ambulatory Visit: Payer: Medicare Other | Admitting: Adult Health

## 2020-05-22 ENCOUNTER — Encounter: Payer: Medicare Other | Admitting: Cardiovascular Disease

## 2020-05-31 DIAGNOSIS — I480 Paroxysmal atrial fibrillation: Secondary | ICD-10-CM | POA: Diagnosis not present

## 2020-05-31 DIAGNOSIS — J449 Chronic obstructive pulmonary disease, unspecified: Secondary | ICD-10-CM | POA: Diagnosis not present

## 2020-05-31 DIAGNOSIS — D692 Other nonthrombocytopenic purpura: Secondary | ICD-10-CM | POA: Diagnosis not present

## 2020-05-31 DIAGNOSIS — Z950 Presence of cardiac pacemaker: Secondary | ICD-10-CM | POA: Diagnosis not present

## 2020-05-31 DIAGNOSIS — N1832 Chronic kidney disease, stage 3b: Secondary | ICD-10-CM | POA: Diagnosis not present

## 2020-05-31 DIAGNOSIS — E7800 Pure hypercholesterolemia, unspecified: Secondary | ICD-10-CM | POA: Diagnosis not present

## 2020-05-31 DIAGNOSIS — I495 Sick sinus syndrome: Secondary | ICD-10-CM | POA: Diagnosis not present

## 2020-05-31 DIAGNOSIS — E039 Hypothyroidism, unspecified: Secondary | ICD-10-CM | POA: Diagnosis not present

## 2020-05-31 DIAGNOSIS — I129 Hypertensive chronic kidney disease with stage 1 through stage 4 chronic kidney disease, or unspecified chronic kidney disease: Secondary | ICD-10-CM | POA: Diagnosis not present

## 2020-05-31 DIAGNOSIS — Z23 Encounter for immunization: Secondary | ICD-10-CM | POA: Diagnosis not present

## 2020-06-05 ENCOUNTER — Ambulatory Visit (INDEPENDENT_AMBULATORY_CARE_PROVIDER_SITE_OTHER): Payer: Medicare Other

## 2020-06-05 DIAGNOSIS — I495 Sick sinus syndrome: Secondary | ICD-10-CM

## 2020-06-05 LAB — CUP PACEART REMOTE DEVICE CHECK
Battery Remaining Longevity: 106 mo
Battery Remaining Percentage: 95.5 %
Battery Voltage: 3.02 V
Brady Statistic AP VP Percent: 1 %
Brady Statistic AP VS Percent: 63 %
Brady Statistic AS VP Percent: 1 %
Brady Statistic AS VS Percent: 36 %
Brady Statistic RA Percent Paced: 63 %
Brady Statistic RV Percent Paced: 1 %
Date Time Interrogation Session: 20211213020013
Implantable Lead Implant Date: 20210315
Implantable Lead Implant Date: 20210315
Implantable Lead Location: 753859
Implantable Lead Location: 753860
Implantable Pulse Generator Implant Date: 20210315
Lead Channel Impedance Value: 390 Ohm
Lead Channel Impedance Value: 540 Ohm
Lead Channel Pacing Threshold Amplitude: 0.75 V
Lead Channel Pacing Threshold Amplitude: 1 V
Lead Channel Pacing Threshold Pulse Width: 0.5 ms
Lead Channel Pacing Threshold Pulse Width: 0.5 ms
Lead Channel Sensing Intrinsic Amplitude: 3.3 mV
Lead Channel Sensing Intrinsic Amplitude: 9 mV
Lead Channel Setting Pacing Amplitude: 2.5 V
Lead Channel Setting Pacing Amplitude: 2.5 V
Lead Channel Setting Pacing Pulse Width: 0.5 ms
Lead Channel Setting Sensing Sensitivity: 2 mV
Pulse Gen Model: 2272
Pulse Gen Serial Number: 3805113

## 2020-06-07 DIAGNOSIS — Z23 Encounter for immunization: Secondary | ICD-10-CM | POA: Diagnosis not present

## 2020-06-20 NOTE — Progress Notes (Signed)
Remote pacemaker transmission.   

## 2020-06-27 ENCOUNTER — Telehealth: Payer: Self-pay | Admitting: Cardiovascular Disease

## 2020-06-27 NOTE — Telephone Encounter (Signed)
*  STAT* If patient is at the pharmacy, call can be transferred to refill team.   1. Which medications need to be refilled? (please list name of each medication and dose if known) apixaban (ELIQUIS) 5 MG TABS tablet  2. Which pharmacy/location (including street and city if local pharmacy) is medication to be sent to? [facility] [phone] - [place], [place] - [address] AT [place]  3. Do they need a 30 day or 90 day supply? 90 day supply    Pt has 6 tablets left.

## 2020-06-28 MED ORDER — APIXABAN 2.5 MG PO TABS: 2.5000 mg | ORAL_TABLET | Freq: Two times a day (BID) | ORAL | 1 refills | Status: DC

## 2020-06-28 NOTE — Telephone Encounter (Signed)
Prescription refill request for Eliquis received. Indication:atrial fibrillation Last office visit:9/21  taylor Scr:1.6 3/21 Age: 80 Weight: 49.4 kg  May need to reduce to Eliquis 2.5 mg bid

## 2020-06-28 NOTE — Telephone Encounter (Signed)
Spoke with patient.  Should be on 2.5 mg tablets bid.  She states this is what she has at home.  Not sure why refill came in with 5 mg tablets.

## 2020-08-09 DIAGNOSIS — J449 Chronic obstructive pulmonary disease, unspecified: Secondary | ICD-10-CM | POA: Diagnosis not present

## 2020-08-09 DIAGNOSIS — I129 Hypertensive chronic kidney disease with stage 1 through stage 4 chronic kidney disease, or unspecified chronic kidney disease: Secondary | ICD-10-CM | POA: Diagnosis not present

## 2020-08-09 DIAGNOSIS — I480 Paroxysmal atrial fibrillation: Secondary | ICD-10-CM | POA: Diagnosis not present

## 2020-08-09 DIAGNOSIS — E7800 Pure hypercholesterolemia, unspecified: Secondary | ICD-10-CM | POA: Diagnosis not present

## 2020-08-09 DIAGNOSIS — E039 Hypothyroidism, unspecified: Secondary | ICD-10-CM | POA: Diagnosis not present

## 2020-08-09 DIAGNOSIS — N1832 Chronic kidney disease, stage 3b: Secondary | ICD-10-CM | POA: Diagnosis not present

## 2020-08-09 DIAGNOSIS — N184 Chronic kidney disease, stage 4 (severe): Secondary | ICD-10-CM | POA: Diagnosis not present

## 2020-08-14 DIAGNOSIS — I495 Sick sinus syndrome: Secondary | ICD-10-CM | POA: Diagnosis not present

## 2020-08-14 DIAGNOSIS — I129 Hypertensive chronic kidney disease with stage 1 through stage 4 chronic kidney disease, or unspecified chronic kidney disease: Secondary | ICD-10-CM | POA: Diagnosis not present

## 2020-08-14 DIAGNOSIS — Z950 Presence of cardiac pacemaker: Secondary | ICD-10-CM | POA: Diagnosis not present

## 2020-08-14 DIAGNOSIS — J449 Chronic obstructive pulmonary disease, unspecified: Secondary | ICD-10-CM | POA: Diagnosis not present

## 2020-08-14 DIAGNOSIS — E7800 Pure hypercholesterolemia, unspecified: Secondary | ICD-10-CM | POA: Diagnosis not present

## 2020-08-14 DIAGNOSIS — N184 Chronic kidney disease, stage 4 (severe): Secondary | ICD-10-CM | POA: Diagnosis not present

## 2020-08-14 DIAGNOSIS — I480 Paroxysmal atrial fibrillation: Secondary | ICD-10-CM | POA: Diagnosis not present

## 2020-08-14 DIAGNOSIS — D692 Other nonthrombocytopenic purpura: Secondary | ICD-10-CM | POA: Diagnosis not present

## 2020-08-14 DIAGNOSIS — I700 Atherosclerosis of aorta: Secondary | ICD-10-CM | POA: Diagnosis not present

## 2020-09-04 ENCOUNTER — Ambulatory Visit (INDEPENDENT_AMBULATORY_CARE_PROVIDER_SITE_OTHER): Payer: Medicare Other

## 2020-09-04 DIAGNOSIS — I495 Sick sinus syndrome: Secondary | ICD-10-CM

## 2020-09-05 IMAGING — DX DG CHEST 1V PORT
1 series · 1 of 1 positions shown · non-contrast
Comparison: 09/11/2018

CLINICAL DATA: Hemoptysis since 2 a.m. today.

EXAM:
PORTABLE CHEST 1 VIEW

[chest ap]
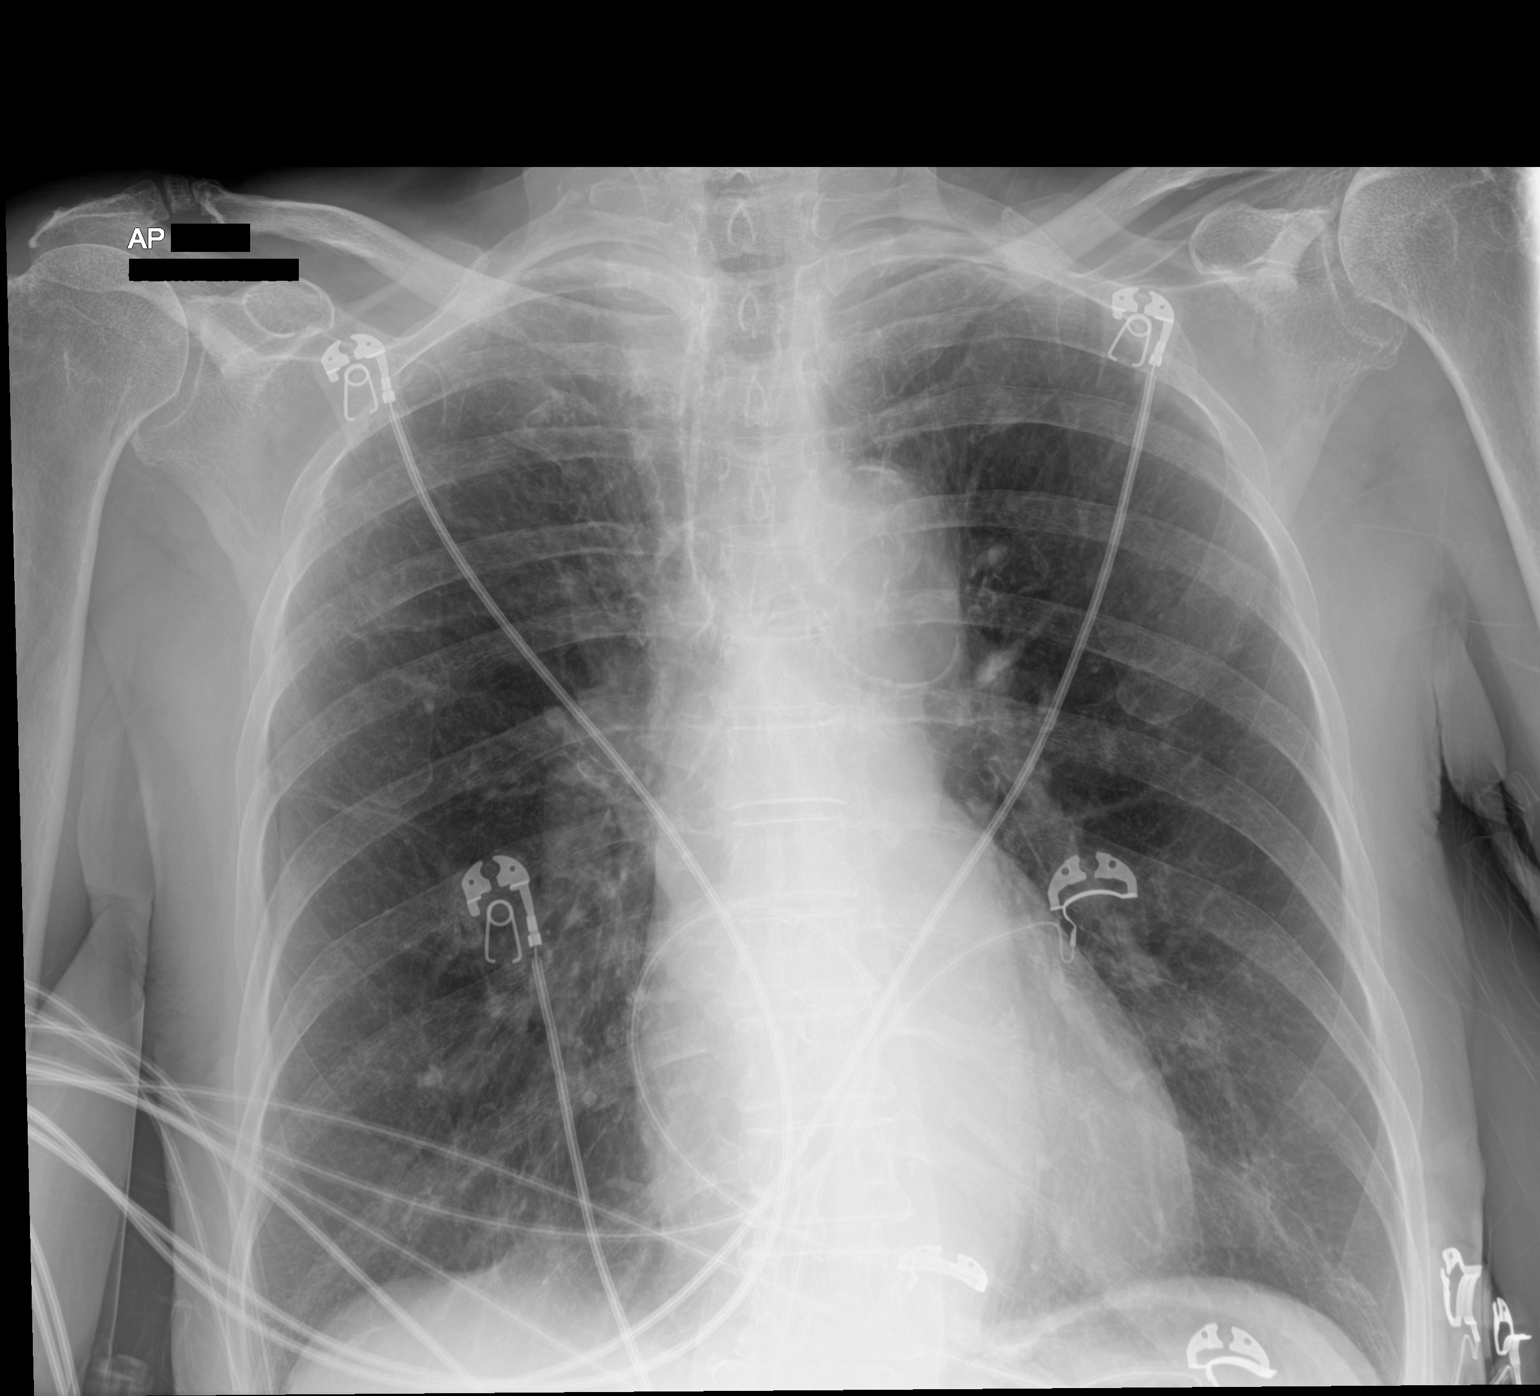

[1 of 1 positions shown; findings below may reference images not displayed]

FINDINGS: Normal sized heart. Tortuous and partially calcified thoracic aorta.
Clear lungs with normal vascularity. The lungs remain hyperexpanded.
Diffuse osteopenia.
IMPRESSION: No acute abnormality. Stable changes of COPD.

## 2020-09-06 LAB — CUP PACEART REMOTE DEVICE CHECK
Battery Remaining Longevity: 107 mo
Battery Remaining Percentage: 95.5 %
Battery Voltage: 3.02 V
Brady Statistic AP VP Percent: 1 %
Brady Statistic AP VS Percent: 50 %
Brady Statistic AS VP Percent: 1 %
Brady Statistic AS VS Percent: 49 %
Brady Statistic RA Percent Paced: 50 %
Brady Statistic RV Percent Paced: 1 %
Date Time Interrogation Session: 20220314041617
Implantable Lead Implant Date: 20210315
Implantable Lead Implant Date: 20210315
Implantable Lead Location: 753859
Implantable Lead Location: 753860
Implantable Pulse Generator Implant Date: 20210315
Lead Channel Impedance Value: 400 Ohm
Lead Channel Impedance Value: 560 Ohm
Lead Channel Pacing Threshold Amplitude: 0.75 V
Lead Channel Pacing Threshold Amplitude: 1 V
Lead Channel Pacing Threshold Pulse Width: 0.5 ms
Lead Channel Pacing Threshold Pulse Width: 0.5 ms
Lead Channel Sensing Intrinsic Amplitude: 2.6 mV
Lead Channel Sensing Intrinsic Amplitude: 4.8 mV
Lead Channel Setting Pacing Amplitude: 2.5 V
Lead Channel Setting Pacing Amplitude: 2.5 V
Lead Channel Setting Pacing Pulse Width: 0.5 ms
Lead Channel Setting Sensing Sensitivity: 2 mV
Pulse Gen Model: 2272
Pulse Gen Serial Number: 3805113

## 2020-09-12 NOTE — Progress Notes (Signed)
Remote pacemaker transmission.   

## 2020-09-18 ENCOUNTER — Observation Stay (HOSPITAL_COMMUNITY): Payer: Medicare Other

## 2020-09-18 ENCOUNTER — Observation Stay (HOSPITAL_COMMUNITY)
Admission: EM | Admit: 2020-09-18 | Discharge: 2020-09-19 | Disposition: A | Discharge status: Home / Self Care | Payer: Medicare Other | Attending: Internal Medicine | Admitting: Internal Medicine

## 2020-09-18 ENCOUNTER — Emergency Department (HOSPITAL_COMMUNITY): Payer: Medicare Other

## 2020-09-18 ENCOUNTER — Telehealth: Payer: Self-pay | Admitting: Physician Assistant

## 2020-09-18 DIAGNOSIS — Z86711 Personal history of pulmonary embolism: Secondary | ICD-10-CM | POA: Diagnosis not present

## 2020-09-18 DIAGNOSIS — E039 Hypothyroidism, unspecified: Secondary | ICD-10-CM | POA: Diagnosis not present

## 2020-09-18 DIAGNOSIS — I160 Hypertensive urgency: Secondary | ICD-10-CM | POA: Diagnosis not present

## 2020-09-18 DIAGNOSIS — I10 Essential (primary) hypertension: Secondary | ICD-10-CM | POA: Diagnosis not present

## 2020-09-18 DIAGNOSIS — I119 Hypertensive heart disease without heart failure: Secondary | ICD-10-CM | POA: Diagnosis present

## 2020-09-18 DIAGNOSIS — N1832 Chronic kidney disease, stage 3b: Secondary | ICD-10-CM | POA: Diagnosis not present

## 2020-09-18 DIAGNOSIS — R079 Chest pain, unspecified: Secondary | ICD-10-CM | POA: Diagnosis present

## 2020-09-18 DIAGNOSIS — J449 Chronic obstructive pulmonary disease, unspecified: Secondary | ICD-10-CM | POA: Insufficient documentation

## 2020-09-18 DIAGNOSIS — D571 Sickle-cell disease without crisis: Secondary | ICD-10-CM | POA: Diagnosis not present

## 2020-09-18 DIAGNOSIS — I129 Hypertensive chronic kidney disease with stage 1 through stage 4 chronic kidney disease, or unspecified chronic kidney disease: Secondary | ICD-10-CM | POA: Diagnosis not present

## 2020-09-18 DIAGNOSIS — I480 Paroxysmal atrial fibrillation: Secondary | ICD-10-CM | POA: Diagnosis present

## 2020-09-18 DIAGNOSIS — Z7901 Long term (current) use of anticoagulants: Secondary | ICD-10-CM | POA: Insufficient documentation

## 2020-09-18 DIAGNOSIS — J439 Emphysema, unspecified: Secondary | ICD-10-CM | POA: Diagnosis present

## 2020-09-18 DIAGNOSIS — I6782 Cerebral ischemia: Secondary | ICD-10-CM | POA: Diagnosis not present

## 2020-09-18 DIAGNOSIS — R42 Dizziness and giddiness: Secondary | ICD-10-CM | POA: Diagnosis not present

## 2020-09-18 DIAGNOSIS — E785 Hyperlipidemia, unspecified: Secondary | ICD-10-CM | POA: Diagnosis not present

## 2020-09-18 DIAGNOSIS — R0902 Hypoxemia: Secondary | ICD-10-CM | POA: Diagnosis not present

## 2020-09-18 DIAGNOSIS — I739 Peripheral vascular disease, unspecified: Secondary | ICD-10-CM | POA: Diagnosis not present

## 2020-09-18 DIAGNOSIS — Z79899 Other long term (current) drug therapy: Secondary | ICD-10-CM | POA: Diagnosis not present

## 2020-09-18 DIAGNOSIS — E782 Mixed hyperlipidemia: Secondary | ICD-10-CM | POA: Diagnosis present

## 2020-09-18 DIAGNOSIS — N183 Chronic kidney disease, stage 3 unspecified: Secondary | ICD-10-CM | POA: Insufficient documentation

## 2020-09-18 DIAGNOSIS — F419 Anxiety disorder, unspecified: Secondary | ICD-10-CM | POA: Diagnosis not present

## 2020-09-18 DIAGNOSIS — N179 Acute kidney failure, unspecified: Secondary | ICD-10-CM | POA: Diagnosis not present

## 2020-09-18 DIAGNOSIS — R0789 Other chest pain: Principal | ICD-10-CM | POA: Diagnosis present

## 2020-09-18 DIAGNOSIS — Z20822 Contact with and (suspected) exposure to covid-19: Secondary | ICD-10-CM | POA: Insufficient documentation

## 2020-09-18 DIAGNOSIS — R072 Precordial pain: Secondary | ICD-10-CM | POA: Diagnosis not present

## 2020-09-18 LAB — CBC WITH DIFFERENTIAL/PLATELET
Abs Immature Granulocytes: 0.03 10*3/uL (ref 0.00–0.07)
Basophils Absolute: 0.1 10*3/uL (ref 0.0–0.1)
Basophils Relative: 1 %
Eosinophils Absolute: 0.1 10*3/uL (ref 0.0–0.5)
Eosinophils Relative: 1 %
HCT: 31.7 % — ABNORMAL LOW (ref 36.0–46.0)
Hemoglobin: 10.6 g/dL — ABNORMAL LOW (ref 12.0–15.0)
Immature Granulocytes: 0 %
Lymphocytes Relative: 10 %
Lymphs Abs: 1 10*3/uL (ref 0.7–4.0)
MCH: 30.5 pg (ref 26.0–34.0)
MCHC: 33.4 g/dL (ref 30.0–36.0)
MCV: 91.4 fL (ref 80.0–100.0)
Monocytes Absolute: 1.3 10*3/uL — ABNORMAL HIGH (ref 0.1–1.0)
Monocytes Relative: 13 %
Neutro Abs: 7.1 10*3/uL (ref 1.7–7.7)
Neutrophils Relative %: 75 %
Platelets: 192 10*3/uL (ref 150–400)
RBC: 3.47 MIL/uL — ABNORMAL LOW (ref 3.87–5.11)
RDW: 13.3 % (ref 11.5–15.5)
WBC: 9.5 10*3/uL (ref 4.0–10.5)
nRBC: 0 % (ref 0.0–0.2)

## 2020-09-18 LAB — COMPREHENSIVE METABOLIC PANEL
ALT: 13 U/L (ref 0–44)
AST: 28 U/L (ref 15–41)
Albumin: 2.8 g/dL — ABNORMAL LOW (ref 3.5–5.0)
Alkaline Phosphatase: 74 U/L (ref 38–126)
Anion gap: 8 (ref 5–15)
BUN: 24 mg/dL — ABNORMAL HIGH (ref 8–23)
CO2: 25 mmol/L (ref 22–32)
Calcium: 8.7 mg/dL — ABNORMAL LOW (ref 8.9–10.3)
Chloride: 105 mmol/L (ref 98–111)
Creatinine, Ser: 1.86 mg/dL — ABNORMAL HIGH (ref 0.44–1.00)
GFR, Estimated: 27 mL/min — ABNORMAL LOW (ref >60)
Glucose, Bld: 100 mg/dL — ABNORMAL HIGH (ref 70–99)
Potassium: 4 mmol/L (ref 3.5–5.1)
Sodium: 138 mmol/L (ref 135–145)
Total Bilirubin: 0.9 mg/dL (ref 0.3–1.2)
Total Protein: 6.2 g/dL — ABNORMAL LOW (ref 6.5–8.1)

## 2020-09-18 LAB — TROPONIN I (HIGH SENSITIVITY)
Troponin I (High Sensitivity): 16 ng/L (ref <18)
Troponin I (High Sensitivity): 16 ng/L (ref <18)

## 2020-09-18 LAB — HEMOGLOBIN A1C
Hgb A1c MFr Bld: 5.5 % (ref 4.8–5.6)
Mean Plasma Glucose: 111.15 mg/dL

## 2020-09-18 MED ORDER — FLECAINIDE ACETATE 50 MG PO TABS
50.0000 mg | ORAL_TABLET | Freq: Two times a day (BID) | ORAL | Status: DC
  Administered 2020-09-18 – 2020-09-19 (×2): 50 mg via ORAL
  Filled 2020-09-18 (×3): qty 1

## 2020-09-18 MED ORDER — SODIUM CHLORIDE 0.9 % IV BOLUS
1000.0000 mL | Freq: Once | INTRAVENOUS | Status: AC
  Administered 2020-09-18: 1000 mL via INTRAVENOUS

## 2020-09-18 MED ORDER — LABETALOL HCL 5 MG/ML IV SOLN
10.0000 mg | Freq: Once | INTRAVENOUS | Status: AC
  Administered 2020-09-18: 10 mg via INTRAVENOUS
  Filled 2020-09-18: qty 4

## 2020-09-18 MED ORDER — LABETALOL HCL 5 MG/ML IV SOLN: 10.0000 mg | INTRAVENOUS | Status: DC | PRN

## 2020-09-18 MED ORDER — HYDRALAZINE HCL 20 MG/ML IJ SOLN
10.0000 mg | Freq: Once | INTRAMUSCULAR | Status: AC
  Administered 2020-09-18: 10 mg via INTRAVENOUS
  Filled 2020-09-18: qty 1

## 2020-09-18 MED ORDER — METOPROLOL SUCCINATE ER 25 MG PO TB24
50.0000 mg | ORAL_TABLET | Freq: Every day | ORAL | Status: DC
  Administered 2020-09-18 – 2020-09-19 (×2): 50 mg via ORAL
  Filled 2020-09-18 (×2): qty 2

## 2020-09-18 MED ORDER — ASPIRIN 81 MG PO CHEW: 81.0000 mg | CHEWABLE_TABLET | Freq: Once | ORAL | Status: DC

## 2020-09-18 MED ORDER — MECLIZINE HCL 25 MG PO TABS: 12.5000 mg | ORAL_TABLET | Freq: Two times a day (BID) | ORAL | Status: DC | PRN

## 2020-09-18 MED ORDER — SENNA 8.6 MG PO TABS
1.0000 | ORAL_TABLET | ORAL | Status: DC
  Administered 2020-09-19: 8.6 mg via ORAL
  Filled 2020-09-18: qty 1

## 2020-09-18 MED ORDER — AMLODIPINE BESYLATE 5 MG PO TABS
5.0000 mg | ORAL_TABLET | Freq: Every day | ORAL | Status: DC
  Administered 2020-09-18: 5 mg via ORAL
  Filled 2020-09-18: qty 1

## 2020-09-18 MED ORDER — LEVOTHYROXINE SODIUM 75 MCG PO TABS
75.0000 ug | ORAL_TABLET | Freq: Every day | ORAL | Status: DC
  Administered 2020-09-19: 75 ug via ORAL
  Filled 2020-09-18: qty 1

## 2020-09-18 MED ORDER — SODIUM CHLORIDE 0.9 % IV SOLN: INTRAVENOUS | Status: DC

## 2020-09-18 MED ORDER — APIXABAN 2.5 MG PO TABS
2.5000 mg | ORAL_TABLET | Freq: Two times a day (BID) | ORAL | Status: DC
  Administered 2020-09-18 – 2020-09-19 (×2): 2.5 mg via ORAL
  Filled 2020-09-18 (×4): qty 1

## 2020-09-18 MED ORDER — TRAMADOL HCL 50 MG PO TABS: 50.0000 mg | ORAL_TABLET | Freq: Two times a day (BID) | ORAL | Status: DC | PRN

## 2020-09-18 MED ORDER — POLYETHYLENE GLYCOL 3350 17 G PO PACK: 17.0000 g | PACK | Freq: Every day | ORAL | Status: DC | PRN

## 2020-09-18 MED ORDER — IPRATROPIUM-ALBUTEROL 0.5-2.5 (3) MG/3ML IN SOLN: 3.0000 mL | Freq: Four times a day (QID) | RESPIRATORY_TRACT | Status: DC | PRN

## 2020-09-18 MED ORDER — LORAZEPAM 1 MG PO TABS
0.5000 mg | ORAL_TABLET | Freq: Every day | ORAL | Status: DC
  Administered 2020-09-18: 0.5 mg via ORAL
  Filled 2020-09-18: qty 1

## 2020-09-18 MED ORDER — NITROGLYCERIN 0.4 MG SL SUBL: 0.4000 mg | SUBLINGUAL_TABLET | SUBLINGUAL | Status: DC | PRN

## 2020-09-18 NOTE — ED Notes (Signed)
Patient transported to CT 

## 2020-09-18 NOTE — Consult Note (Addendum)
Cardiology Consultation:   Patient ID: Deborah Shields MRN: [id number]; DOB: 1941/05/09  Admit date: 09/18/2020 Date of Consult: 09/18/2020  PCP:  Lajean Manes, [place]  Cardiologist:  Sanda Klein, MD   Patient Profile:   Deborah Shields is a 80 y.o. female with a hx of symptomatic paroxysmal atrial fibrillation, sick sinus syndrome s/p pacemaker, bilateral carotid artery disease secondary to fibromuscular dysplasia, hypertension, pulmonary embolism, hyperlipidemia, CKD stage III and hypothyroidism who is being seen today for the evaluation of chest pain at the request of Dr. Tawanna Solo.  Patient with history of atrial fibrillation with rapid ventricular rate in setting of upper GI bleed/hemoptysis March 2021. Underwent bronchoscopy finding blood in the bronchi but cytologies were negative for malignancy.  Patient had significant long post terminal pauses.  He was seen by Dr. Crissie Sickles on September 06, 2019 and underwent implantation of a left subclavian dual-chamber St Jude permanent pacemaker.  Deborah Shields was doing well when last seen by Dr. Sallyanne Kuster 11/2019.  History of Present Illness:   Deborah Shields was in usual state of health up until last night when started to having substernal chest pain around 9:30 PM.  Deborah Shields had Deborah Shields regular dinner around 6:30 PM.  Deborah Shields describes Deborah Shields pain as pressure-like sensation across Deborah Shields chest radiating to shoulder blade and left axilla.  It was 10 out of 10 initially then it eased off.  Eventually fall asleep but Deborah Shields was never comfortable.  Deborah Shields denies associated palpitation, shortness of breath, diaphoresis, nausea or vomiting.  This morning Deborah Shields felt dizzy and EMS was called.  Deborah Shields was found hypertensive at 185/108 and brought to ER for further evaluation.  Patient continued to have 6 out of 10 chest discomfort radiating to Deborah Shields shoulder blade.  Blood pressure remains elevated.  Patient reported that Deborah Shields blood pressure was elevated  last week when seen by PCP.  Deborah Shields denies palpitation, orthopnea, PND, syncope, lower extremity edema or melena.  Denies slurred speech or weakness on one side of body.  High-sensitivity troponin 16x2 Creatinine 1.86 (baseline around 1.2-1.3) Potassium 4.2 Hemoglobin 10.6 CT of head without acute intracranial abnormality Chest x-ray without acute finding   Past Medical History:  Diagnosis Date  . Anxiety   . Aortic insufficiency    mild due to degenerative changes  . Arthritis   . Basal cell carcinoma 2007   [facility] Left leg  . Carotid artery occlusion   . Chronic kidney disease    CRD Stage 3  . Conjunctivitis due to adenovirus, both eyes   . Degenerative joint disease   . Diverticulosis of colon   . Dyspnea    with exertion  . Heart murmur   . Hyperlipidemia    NMR 07/2009: LDL 200 (2260/1233)TG 99, HDL 65. LDL goal =<120, ideally <100. father MI @ 74  . Hypertension   . Hypothyroidism    Dr Wilson Singer  . Microscopic hematuria   . Peripheral neuropathy    compressive in UE bilaterally; Dr Daylene Katayama  . Peripheral vascular disease ([place])    ICA bilat, Dr.Charles fields, [facility]  . Pneumonia    2017  . PONV (postoperative nausea and vomiting)    "Inner ear, does  okay with Scopolamine"  . Pulmonary embolus ([place])   . Rectocele   . [place] spotted fever     Past Surgical History:  Procedure Laterality Date  . ABDOMINAL HYSTERECTOMY  1973   age 76 due to dysfuctional menses; HRT  x 25-30 years  . APPENDECTOMY  1952  . basal cell cancer  03/2006   leg  . BILATERAL OOPHORECTOMY  1990   prophylactically (sister had ovarian ca)  . BRONCHIAL BRUSHINGS  08/30/2019   Procedure: BRONCHIAL BRUSHINGS;  Surgeon: Candee Furbish, MD;  Location: [facility];  Service: Endoscopy;;  . BRONCHIAL WASHINGS  08/30/2019   Procedure: BRONCHIAL WASHINGS;  Surgeon: Candee Furbish, MD;  Location: [facility];  Service: Endoscopy;;  . CARPAL TUNNEL RELEASE Bilateral 1989   right  .  CATARACT EXTRACTION, BILATERAL  2010   Dr Kathrin Penner  . CHOLECYSTECTOMY  2006  . COLONOSCOPY     Dr Ardis Hughs  . EYE SURGERY Left    Bilateral eye (film removed lt eye 01/09/17)  . LUMBAR LAMINECTOMY/DECOMPRESSION MICRODISCECTOMY Left 03/20/2017   Procedure: LEFT LUMBAR FOURLUMBAR FIVE LAMINOTOMY AND MICROICRODISCECTOMY 1 LEVEL;  Surgeon: Jovita Gamma, MD;  Location: [facility];  Service: Neurosurgery;  Laterality: Left;  LEFT  . NM MYOVIEW LTD  06/13/2008   low risk scan  . ORTHOPEDIC SURGERY  1989/200/2002/2012   elbows,shoulder surgery x 3, right hand  . PACEMAKER IMPLANT N/A 09/06/2019   Procedure: PACEMAKER IMPLANT;  Surgeon: Evans Lance, MD;  Location: [facility];  Service: Cardiovascular;  Laterality: N/A;  . RECTOCELE REPAIR  2016  . TONSILLECTOMY  1957  . US ECHOCARDIOGRAPHY  05/01/2010   trace MR,AI,TR;EF =>55%  . VIDEO BRONCHOSCOPY Bilateral 07/17/2017   Procedure: VIDEO BRONCHOSCOPY WITHOUT FLUORO;  Surgeon: Rigoberto Noel, MD;  Location: [facility];  Service: Cardiopulmonary;  Laterality: Bilateral;  . VIDEO BRONCHOSCOPY Bilateral 09/02/2017   Procedure: VIDEO BRONCHOSCOPY WITHOUT FLUORO;  Surgeon: Rigoberto Noel, MD;  Location: [facility];  Service: Cardiopulmonary;  Laterality: Bilateral;  . VIDEO BRONCHOSCOPY N/A 08/30/2019   Procedure: VIDEO BRONCHOSCOPY WITHOUT FLUORO;  Surgeon: Candee Furbish, MD;  Location: [facility];  Service: Endoscopy;  Laterality: N/A;     Inpatient Medications: Scheduled Meds: . apixaban  2.5 mg Oral BID  . aspirin  81 mg Oral Once  . flecainide  50 mg Oral Q12H  . [START ON 09/19/2020] levothyroxine  75 mcg Oral QAC breakfast  . LORazepam  0.5 mg Oral QHS  . metoprolol succinate  50 mg Oral Daily  . senna  1 tablet Oral QODAY   Continuous Infusions: . sodium chloride 75 mL/hr at 09/18/20 1646   PRN Meds: ipratropium-albuterol, labetalol, meclizine, nitroGLYCERIN, polyethylene glycol, traMADol  Allergies:    Allergies   Allergen Reactions  . Aspirin Nausea Only and Other (See Comments)    REACTION: GI upset  ( pt can take 81 mg but NOT   325 mg ASA )  . Lovastatin Nausea Only and Other (See Comments)    REACTION: nausea  . Benazepril Hcl Other (See Comments)    Unknown  . Paroxetine Other (See Comments)    Unknown  . Tape Other (See Comments)  . Bupropion Hcl Other (See Comments)    REACTION: tinnitis  . Ezetimibe Nausea And Vomiting and Other (See Comments)    REACTION: GI symptoms  . Fenofibrate Other (See Comments)    Myalgia   . Pravastatin Sodium Other (See Comments)    REACTION: elevated CPK - Muscle's in bilateral Leg    Social History:   Social History   Socioeconomic History  . Marital status: Widowed    Spouse name: Not on file  . Number of children: 1  . Years of education: Not on file  .  Highest education level: Not on file  Occupational History  . Occupation: retired  Tobacco Use  . Smoking status: Never Smoker  . Smokeless tobacco: Never Used  Vaping Use  . Vaping Use: Never used  Substance and Sexual Activity  . Alcohol use: No    Alcohol/week: 0.0 standard drinks  . Drug use: No  . Sexual activity: Not on file  Other Topics Concern  . Not on file  Social History Narrative   Low cholesterol diet   Regular exercise- yes    Social Determinants of Health   Financial Resource Strain: Not on file  Food Insecurity: Not on file  Transportation Needs: Not on file  Physical Activity: Not on file  Stress: Not on file  Social Connections: Not on file  Intimate Partner Violence: Not on file    Family History:   Family History  Problem Relation Age of Onset  . Heart attack Father 70  . Heart disease Father        Before age 33  . Colon cancer Mother 58  . Stroke Mother 60  . Kidney disease Mother        ? renal calculi; S/P resecton of kidney  . Cancer - Colon Mother 31  . Ovarian cancer Sister   . Other Sister        Valve replacement (aortic & mitral ) in  2 sisters  . Heart disease Sister        Before age 32  . Heart disease Brother        aortic & mitral valve replacement in 2 bro; both had CBAG  . Diabetes Neg Hx   . Breast cancer Neg Hx      ROS:  Please see the history of present illness.  All other ROS reviewed and negative.     Physical Exam/Data:   Vitals:   09/18/20 1545 09/18/20 1600 09/18/20 1615 09/18/20 1630  BP: (!) 166/134 (!) 187/98 (!) 195/95 (!) 205/110  Pulse: 66 65 62 65  Resp: 15 15 18 13   Temp:      TempSrc:      SpO2: 100% 100% 90% 96%  Weight:      Height:        Intake/Output Summary (Last 24 hours) at 09/18/2020 1656 Last data filed at 09/18/2020 1649 Gross per 24 hour  Intake 1000 ml  Output 1200 ml  Net -200 ml   Last 3 Weights 09/18/2020 12/06/2019 10/01/2019  Weight (lbs) 105 lb 109 lb 107 lb 9.6 oz  Weight (kg) 47.628 kg 49.442 kg 48.807 kg     Body mass index is 16.95 kg/m.  General:  Well nourished, well developed, in no acute distress HEENT: normal Lymph: no adenopathy Neck: no JVD Endocrine:  No thryomegaly Vascular: No carotid bruits; FA pulses 2+ bilaterally without bruits  Cardiac:  normal S1, S2; RRR; 2/6  murmur Lungs:  clear to auscultation bilaterally, no wheezing, rhonchi or rales  Abd: soft, nontender, no hepatomegaly  Ext: no edema Musculoskeletal:  No deformities, BUE and BLE strength normal and equal Skin: warm and dry  Neuro:  CNs 2-12 intact, no focal abnormalities noted Psych:  Normal affect   EKG:  The EKG was personally reviewed and demonstrates: Normal sinus rhythm Telemetry:  Telemetry was personally reviewed and demonstrates: Normal sinus rhythm  Relevant CV Studies:  Echo 08/2019 1. Left ventricular ejection fraction, by estimation, is 55 to 60%. The  left ventricle has normal function. The left ventricle has  no regional  wall motion abnormalities. Left ventricular diastolic parameters were  normal.  2. Right ventricular systolic function is normal. The  right ventricular  size is normal. There is normal pulmonary artery systolic pressure. The  estimated right ventricular systolic pressure is 85.8 mmHg.  3. The mitral valve is normal in structure. No evidence of mitral valve  regurgitation.  4. The aortic valve is tricuspid. Aortic valve regurgitation is mild to  moderate. Mild aortic valve stenosis. Aortic valve area, by VTI measures  1.42 cm. Aortic valve mean gradient measures 11.0 mmHg. Aortic valve Vmax  measures 2.27 m/s.  5. The inferior vena cava is normal in size with greater than 50%  respiratory variability, suggesting right atrial pressure of 3 mmHg.   Laboratory Data:  High Sensitivity Troponin:   Recent Labs  Lab 09/18/20 1001 09/18/20 1201  TROPONINIHS 16 16     Chemistry Recent Labs  Lab 09/18/20 1001  NA 138  K 4.0  CL 105  CO2 25  GLUCOSE 100*  BUN 24*  CREATININE 1.86*  CALCIUM 8.7*  GFRNONAA 27*  ANIONGAP 8    Recent Labs  Lab 09/18/20 1001  PROT 6.2*  ALBUMIN 2.8*  AST 28  ALT 13  ALKPHOS 74  BILITOT 0.9   Hematology Recent Labs  Lab 09/18/20 1001  WBC 9.5  RBC 3.47*  HGB 10.6*  HCT 31.7*  MCV 91.4  MCH 30.5  MCHC 33.4  RDW 13.3  PLT 192   Radiology/Studies:  CT Head Wo Contrast  Result Date: 09/18/2020 CLINICAL DATA:  Dizziness, nonspecific. Additional history provided: Dizziness, on blood thinners. EXAM: CT HEAD WITHOUT CONTRAST TECHNIQUE: Contiguous axial images were obtained from the base of the skull through the vertex without intravenous contrast. COMPARISON:  Brain MRI 06/29/2004. FINDINGS: Brain: Mild cerebral atrophy. Moderate patchy and ill-defined hypoattenuation within the cerebral white matter is nonspecific, but compatible with chronic small vessel ischemic disease. There is no acute intracranial hemorrhage. No demarcated cortical infarct. No extra-axial fluid collection. No evidence of intracranial mass. No midline shift. Vascular: No hyperdense vessel.   Atherosclerotic calcifications. Skull: Normal. Negative for fracture or focal lesion. Sinuses/Orbits: Visualized orbits show no acute finding. Tiny right sphenoid sinus mucous retention cyst. IMPRESSION: No evidence of acute intracranial abnormality. Moderate cerebral white matter chronic small vessel ischemic disease. Mild generalized cerebral atrophy. Incidentally noted small right sphenoid sinus mucous retention cyst. Electronically Signed   By: Kellie Simmering DO   On: 09/18/2020 13:25   DG Chest Portable 1 View  Result Date: 09/18/2020 CLINICAL DATA:  Chest pain and dizziness since last night. EXAM: PORTABLE CHEST 1 VIEW COMPARISON:  Radiographs 10/01/2019.  CT 08/28/2019. FINDINGS: 1010 hours. Left subclavian pacemaker leads appear unchanged at the level of the right atrium and right ventricle. The heart size and mediastinal contours are stable with aortic atherosclerosis. Mild scarring is present at both lung apices. The lungs are otherwise clear. There is no pleural effusion or pneumothorax. Bilateral skin folds are noted. Evidence of a chronic rotator cuff tear on the left. No acute osseous findings. IMPRESSION: Stable chest. No active cardiopulmonary process. Electronically Signed   By: Richardean Sale M.D.   On: 09/18/2020 10:25     Assessment and Plan:   1. Chest pressure Greater than 15 hours of constant substernal chest pressure radiating to Deborah Shields back left axilla.  Patient has been ruled out.  Troponin negative.  EKG without acute ischemic changes.  Differential includes dissection given elevated blood pressure.  Agree with echocardiogram.  CT of chest could be considered however elevated renal function.  With significant elevated blood pressures, and chest discomfort radiating to the back between the shoulder blades, it does make sense to exclude aortic dissection, however Deborah Shields does have equal pulses on both radial pulses and appears to be in no obvious distress. => Chest CTA would be  reasonable, especially since he has been hydrated relatively aggressively for Deborah Shields renal insufficiency.  We can consider ischemic evaluation pending results of the study, however at do not think that Deborah Shields chest pain is cardiac in nature based on duration of symptoms and negative troponins.  Echo pending.  2.  Uncontrolled hypertension/hypertensive urgency -Patient was found to have elevated blood pressure last week during PCP visit -Blood pressure significantly elevated here -Continue Toprol-XL 50 mg daily -Got Lebetalol 10mg  x 1 @ 1647  Deborah Shields needs additional IV medications PRN; will write for as needed labetalol and IV hydralazine.  3.  Paroxysmal atrial fibrillation -Maintaining sinus rhythm -Patient reports compliance with Eliquis>> will continue at 2.5 mg twice daily -No bleeding issue -Continue beta-blocker  4.  Sick sinus syndrome s/p pacemaker  5. Acute on CKD III - Baseline Scr ~1.3 - Scr of 1.86 today  - Got 1L of NS bolus  And now on gentle fluids -> concern for contrast nephropathy if we were to do a chest CTA, however the nature of Deborah Shields symptoms aches dissection a very concerning potential etiology.  Risk Assessment/Risk Scores:   HEAR Score (for undifferentiated chest pain):  HEAR Score: 5{   CHA2DS2-VASc Score = 5  This indicates a 7.2% annual risk of stroke. The patient's score is based upon: CHF History: No HTN History: Yes Diabetes History: No Stroke History: No Vascular Disease History: Yes Age Score: 2 Gender Score: 1    Signed, Leanor Kail, [place]  09/18/2020 4:56 PM    ATTENDING ATTESTATION  I have seen, examined and evaluated the patient this evening along with Mr. Curly [place], [place].  After reviewing all the available data and chart, we discussed the patients laboratory, study & physical findings as well as symptoms in detail. I agree with his findings, examination as well as impression recommendations as per our discussion.    Attending adjustments  noted in italics.   Difficult situation with the patient having pretty significant hypertension and prolonged chest pain radiating to the back.  It is concerning for possible aortic dissection.  The other concern is with Deborah Shields creatinine of 1.8 (AKI), we are reluctant to perform CTA of the chest.  However I do think it is reasonable to exclude this potentially life-threatening emergency. Deborah Shields has received quite a bit of hydration, which should hopefully help flush out Deborah Shields renal function.  Deborah Shields does need more aggressive blood pressure control.  Deborah Shields pain improved as Deborah Shields blood pressure comes down.  We will write for additional as needed's.  We will follow along.  Can determine in the a.m. if we need to consider ischemic evaluation with Myoview as an inpatient versus outpatient.   Glenetta Hew, MD Glenetta Hew, M.D., M.S. Interventional Cardiologist   Pager # [phone] Phone # [phone] [address] [place], [place] [address]  For questions or updates, please contact [place] Please consult [email or web address] for contact info under

## 2020-09-18 NOTE — ED Notes (Signed)
Provider aware that the [facility] device to interrogate pacemaker is not available on unit at this time. Will have floor Rn notify St Jude rep if interrogation is indicated by admit provider. ED MD Karle Starch and PA ok with this plan.

## 2020-09-18 NOTE — H&P (Signed)
History and Physical    Deborah Shields MCN:[id number] DOB: 1941/01/25 DOA: 09/18/2020  PCP: Lajean Manes, MD   Patient coming from:     Chief Complaint:   HPI: Deborah Shields is a 80 y.o. female with medical history significant of paroxysmal A. fib status post pacemaker placement, hypertension, PE,  hyperlipidemia, CKD stage IIIb who presented to the emergency department with complaints of chest pain that started last night.  Patient described chest pain as central, nonradiating, constant and started while watching TV last night.  Not associated with exertion, pressure-like. Denies any cough or shortness of breath.  She lives alone.  Her daughter lives next to her.  She has history of A. fib and is status post pacemaker placement, follows with cardiology.  She follows regularly with her PCP.  She does not report missing her medications.  There was no history of fever, chills, abdominal pain, dysuria, diarrhea, vomiting, nausea, hematochezia or melena.  She is a past smoker. She was given aspirin 324 mg by EMS prior to arrival.  Patient was also complaining of dizziness but found to be hypertensive on presentation.  No history of weakness, change in vision or speech problem. Patient seen and examined the bedside this afternoon.  Daughter was present at the bedside too.  During my evaluation, she did not complain of any chest pain and her blood pressure was improving.  Physical examination was relatively unremarkable, she looked comfortable.   ED Course: Remained mostly hemodynamically stable though mildly hypertensive.  EKG did not show any significant ST changes.  Lab work showed creatinine of 1.86.  Chest x-ray did not show any acute findings.  CT head did not show any acute intracranial abnormalities.  Patient was admitted for chest pain rule out.  Review of Systems: As per HPI otherwise 10 point review of systems negative.    Past Medical History:  Diagnosis Date  . Anxiety    . Aortic insufficiency    mild due to degenerative changes  . Arthritis   . Basal cell carcinoma 2007   [facility] Left leg  . Carotid artery occlusion   . Chronic kidney disease    CRD Stage 3  . Conjunctivitis due to adenovirus, both eyes   . Degenerative joint disease   . Diverticulosis of colon   . Dyspnea    with exertion  . Heart murmur   . Hyperlipidemia    NMR 07/2009: LDL 200 (2260/1233)TG 99, HDL 65. LDL goal =<120, ideally <100. father MI @ 67  . Hypertension   . Hypothyroidism    Dr Wilson Singer  . Microscopic hematuria   . Peripheral neuropathy    compressive in UE bilaterally; Dr Daylene Katayama  . Peripheral vascular disease ([place])    ICA bilat, Dr.Charles fields, [facility]  . Pneumonia    2017  . PONV (postoperative nausea and vomiting)    "Inner ear, does  okay with Scopolamine"  . Pulmonary embolus ([place])   . Rectocele   . [place] spotted fever     Past Surgical History:  Procedure Laterality Date  . ABDOMINAL HYSTERECTOMY  1973   age 35 due to dysfuctional menses; HRT x 25-30 years  . APPENDECTOMY  1952  . basal cell cancer  03/2006   leg  . BILATERAL OOPHORECTOMY  1990   prophylactically (sister had ovarian ca)  . BRONCHIAL BRUSHINGS  08/30/2019   Procedure: BRONCHIAL BRUSHINGS;  Surgeon: Candee Furbish, MD;  Location: [facility];  Service: Endoscopy;;  .  BRONCHIAL WASHINGS  08/30/2019   Procedure: BRONCHIAL WASHINGS;  Surgeon: Candee Furbish, MD;  Location: [facility];  Service: Endoscopy;;  . CARPAL TUNNEL RELEASE Bilateral 1989   right  . CATARACT EXTRACTION, BILATERAL  2010   Dr Kathrin Penner  . CHOLECYSTECTOMY  2006  . COLONOSCOPY     Dr Ardis Hughs  . EYE SURGERY Left    Bilateral eye (film removed lt eye 01/09/17)  . LUMBAR LAMINECTOMY/DECOMPRESSION MICRODISCECTOMY Left 03/20/2017   Procedure: LEFT LUMBAR FOURLUMBAR FIVE LAMINOTOMY AND MICROICRODISCECTOMY 1 LEVEL;  Surgeon: Jovita Gamma, MD;  Location: [facility];  Service: Neurosurgery;  Laterality:  Left;  LEFT  . NM MYOVIEW LTD  06/13/2008   low risk scan  . ORTHOPEDIC SURGERY  1989/200/2002/2012   elbows,shoulder surgery x 3, right hand  . PACEMAKER IMPLANT N/A 09/06/2019   Procedure: PACEMAKER IMPLANT;  Surgeon: Evans Lance, MD;  Location: [facility];  Service: Cardiovascular;  Laterality: N/A;  . RECTOCELE REPAIR  2016  . TONSILLECTOMY  1957  . US ECHOCARDIOGRAPHY  05/01/2010   trace MR,AI,TR;EF =>55%  . VIDEO BRONCHOSCOPY Bilateral 07/17/2017   Procedure: VIDEO BRONCHOSCOPY WITHOUT FLUORO;  Surgeon: Rigoberto Noel, MD;  Location: [facility];  Service: Cardiopulmonary;  Laterality: Bilateral;  . VIDEO BRONCHOSCOPY Bilateral 09/02/2017   Procedure: VIDEO BRONCHOSCOPY WITHOUT FLUORO;  Surgeon: Rigoberto Noel, MD;  Location: [facility];  Service: Cardiopulmonary;  Laterality: Bilateral;  . VIDEO BRONCHOSCOPY N/A 08/30/2019   Procedure: VIDEO BRONCHOSCOPY WITHOUT FLUORO;  Surgeon: Candee Furbish, MD;  Location: [facility];  Service: Endoscopy;  Laterality: N/A;     reports that she has never smoked. She has never used smokeless tobacco. She reports that she does not drink alcohol and does not use drugs.  Allergies  Allergen Reactions  . Aspirin Nausea Only and Other (See Comments)    REACTION: GI upset  ( pt can take 81 mg but NOT   325 mg ASA )  . Lovastatin Nausea Only and Other (See Comments)    REACTION: nausea  . Benazepril Hcl Other (See Comments)    Unknown  . Paroxetine Other (See Comments)    Unknown  . Tape Other (See Comments)  . Bupropion Hcl Other (See Comments)    REACTION: tinnitis  . Ezetimibe Nausea And Vomiting and Other (See Comments)    REACTION: GI symptoms  . Fenofibrate Other (See Comments)    Myalgia   . Pravastatin Sodium Other (See Comments)    REACTION: elevated CPK - Muscle's in bilateral Leg    Family History  Problem Relation Age of Onset  . Heart attack Father 20  . Heart disease Father        Before age 52  . Colon  cancer Mother 50  . Stroke Mother 34  . Kidney disease Mother        ? renal calculi; S/P resecton of kidney  . Cancer - Colon Mother 66  . Ovarian cancer Sister   . Other Sister        Valve replacement (aortic & mitral ) in 2 sisters  . Heart disease Sister        Before age 52  . Heart disease Brother        aortic & mitral valve replacement in 2 bro; both had CBAG  . Diabetes Neg Hx   . Breast cancer Neg Hx      Prior to Admission medications   Medication Sig Start Date End Date Taking? Authorizing  Provider  apixaban (ELIQUIS) 2.5 MG TABS tablet Take 1 tablet (2.5 mg total) by mouth 2 (two) times daily. 06/28/20   Croitoru, Mihai, MD  b complex vitamins tablet Take 1 tablet by mouth daily after supper.     [provider]  cholecalciferol (VITAMIN D3) 25 MCG (1000 UNIT) tablet Take 1,000 Units by mouth daily.    [provider]  cyclobenzaprine (FLEXERIL) 10 MG tablet Take 10 mg by mouth 2 (two) times daily as needed for muscle spasms (scheduled every every).     [provider]  diclofenac sodium (VOLTAREN) 1 % GEL Apply 2-4 g topically 4 (four) times daily as needed (for pain).     [provider]  flecainide (TAMBOCOR) 50 MG tablet TAKE 1 TABLET(50 MG) BY MOUTH EVERY 12 HOURS 03/06/20   Croitoru, Mihai, MD  levothyroxine (SYNTHROID) 75 MCG tablet Take 1 tablet (75 mcg total) by mouth daily before breakfast. Taking the brand "synthroid" 09/07/19   Dessa Phi, DO  LORazepam (ATIVAN) 0.5 MG tablet Take 1 tablet (0.5 mg total) by mouth 3 (three) times daily as needed. Patient taking differently: Take 0.5 mg by mouth at bedtime.  10/17/15   Binnie Rail, MD  meclizine (ANTIVERT) 25 MG tablet Take 1 tablet (25 mg total) by mouth daily. 1/2 every 8 hrs prn for imbalance Patient taking differently: Take 12.5-25 mg by mouth 2 (two) times daily as needed for dizziness.  11/01/10   Hendricks Limes, MD  metoprolol succinate (TOPROL-XL) 50 MG 24 hr tablet  TAKE 1 TABLET(50 MG) BY MOUTH DAILY 03/06/20   Croitoru, Mihai, MD  senna (SENOKOT) 8.6 MG tablet Take 1 tablet by mouth every other day.     [provider]  traMADol (ULTRAM) 50 MG tablet 1/2-1 by mouth once daily as needed. Patient taking differently: Take 25-50 mg by mouth 2 (two) times daily as needed for moderate pain or severe pain (1 tablet by mouth scheduled each bedtime.).  10/17/15   Binnie Rail, MD    Physical Exam: Vitals:   09/18/20 1215 09/18/20 1230 09/18/20 1245 09/18/20 1400  BP: (!) 185/78 (!) 169/141 (!) 175/76 (!) 186/100  Pulse: 64 64 62 66  Resp: 17 13 17  (!) 21  Temp:      TempSrc:      SpO2: 100% 100% 100% 98%  Weight:      Height:        Constitutional: NAD, calm, comfortable, pleasant elderly female Vitals:   09/18/20 1215 09/18/20 1230 09/18/20 1245 09/18/20 1400  BP: (!) 185/78 (!) 169/141 (!) 175/76 (!) 186/100  Pulse: 64 64 62 66  Resp: 17 13 17  (!) 21  Temp:      TempSrc:      SpO2: 100% 100% 100% 98%  Weight:      Height:       Eyes: PERRL, lids and conjunctivae normal ENMT: Mucous membranes are moist.  Neck: normal, supple, no masses, no thyromegaly Respiratory: clear to auscultation bilaterally, no wheezing, no crackles. Normal respiratory effort. No accessory muscle use.  Cardiovascular: Regular rate and rhythm, no murmurs / rubs / gallops. No extremity edema.  Pacemaker Abdomen: no tenderness, no masses palpated. No hepatosplenomegaly. Bowel sounds positive.  Musculoskeletal: no clubbing / cyanosis. No joint deformity upper and lower extremities.  Skin: no rashes, lesions, ulcers. No induration Neurologic: CN 2-12 grossly intact.  Strength 5/5 in all 4.  Psychiatric: Normal judgment and insight. Alert and oriented x 3. Normal mood.  Foley Catheter:None  Labs on Admission: I have personally reviewed following labs and imaging studies  CBC: Recent Labs  Lab 09/18/20 1001  WBC 9.5  NEUTROABS 7.1  HGB 10.6*  HCT 31.7*   MCV 91.4  PLT 834   Basic Metabolic Panel: Recent Labs  Lab 09/18/20 1001  NA 138  K 4.0  CL 105  CO2 25  GLUCOSE 100*  BUN 24*  CREATININE 1.86*  CALCIUM 8.7*   GFR: Estimated Creatinine Clearance: 17.8 mL/min (A) (by C-G formula based on SCr of 1.86 mg/dL (H)). Liver Function Tests: Recent Labs  Lab 09/18/20 1001  AST 28  ALT 13  ALKPHOS 74  BILITOT 0.9  PROT 6.2*  ALBUMIN 2.8*   No results for input(s): LIPASE, AMYLASE in the last 168 hours. No results for input(s): AMMONIA in the last 168 hours. Coagulation Profile: No results for input(s): INR, PROTIME in the last 168 hours. Cardiac Enzymes: No results for input(s): CKTOTAL, CKMB, CKMBINDEX, TROPONINI in the last 168 hours. BNP (last 3 results) No results for input(s): PROBNP in the last 8760 hours. HbA1C: No results for input(s): HGBA1C in the last 72 hours. CBG: No results for input(s): GLUCAP in the last 168 hours. Lipid Profile: No results for input(s): CHOL, HDL, LDLCALC, TRIG, CHOLHDL, LDLDIRECT in the last 72 hours. Thyroid Function Tests: No results for input(s): TSH, T4TOTAL, FREET4, T3FREE, THYROIDAB in the last 72 hours. Anemia Panel: No results for input(s): VITAMINB12, FOLATE, FERRITIN, TIBC, IRON, RETICCTPCT in the last 72 hours. Urine analysis:    Component Value Date/Time   COLORURINE YELLOW 08/17/2018 0505   APPEARANCEUR CLEAR 08/17/2018 0505   LABSPEC 1.024 08/17/2018 0505   PHURINE 6.0 08/17/2018 0505   GLUCOSEU NEGATIVE 08/17/2018 0505   GLUCOSEU NEGATIVE 11/21/2015 1156   HGBUR MODERATE (A) 08/17/2018 0505   BILIRUBINUR NEGATIVE 08/17/2018 0505   BILIRUBINUR Neg 09/07/2012 1426   KETONESUR NEGATIVE 08/17/2018 0505   PROTEINUR NEGATIVE 08/17/2018 0505   UROBILINOGEN 0.2 11/21/2015 1156   NITRITE NEGATIVE 08/17/2018 0505   LEUKOCYTESUR NEGATIVE 08/17/2018 0505    Radiological Exams on Admission: CT Head Wo Contrast  Result Date: 09/18/2020 CLINICAL DATA:  Dizziness,  nonspecific. Additional history provided: Dizziness, on blood thinners. EXAM: CT HEAD WITHOUT CONTRAST TECHNIQUE: Contiguous axial images were obtained from the base of the skull through the vertex without intravenous contrast. COMPARISON:  Brain MRI 06/29/2004. FINDINGS: Brain: Mild cerebral atrophy. Moderate patchy and ill-defined hypoattenuation within the cerebral white matter is nonspecific, but compatible with chronic small vessel ischemic disease. There is no acute intracranial hemorrhage. No demarcated cortical infarct. No extra-axial fluid collection. No evidence of intracranial mass. No midline shift. Vascular: No hyperdense vessel.  Atherosclerotic calcifications. Skull: Normal. Negative for fracture or focal lesion. Sinuses/Orbits: Visualized orbits show no acute finding. Tiny right sphenoid sinus mucous retention cyst. IMPRESSION: No evidence of acute intracranial abnormality. Moderate cerebral white matter chronic small vessel ischemic disease. Mild generalized cerebral atrophy. Incidentally noted small right sphenoid sinus mucous retention cyst. Electronically Signed   By: Kellie Simmering DO   On: 09/18/2020 13:25   DG Chest Portable 1 View  Result Date: 09/18/2020 CLINICAL DATA:  Chest pain and dizziness since last night. EXAM: PORTABLE CHEST 1 VIEW COMPARISON:  Radiographs 10/01/2019.  CT 08/28/2019. FINDINGS: 1010 hours. Left subclavian pacemaker leads appear unchanged at the level of the right atrium and right ventricle. The heart size and mediastinal contours are stable with aortic atherosclerosis. Mild scarring is present at both lung apices.  The lungs are otherwise clear. There is no pleural effusion or pneumothorax. Bilateral skin folds are noted. Evidence of a chronic rotator cuff tear on the left. No acute osseous findings. IMPRESSION: Stable chest. No active cardiopulmonary process. Electronically Signed   By: Richardean Sale M.D.   On: 09/18/2020 10:25     Assessment/Plan Active  Problems:   Hypothyroidism (acquired)   HYPERLIPIDEMIA   Essential hypertension   Chest pain  Chest pain: Atypical chest pain.  Not associated with exertion.  Continue nitroglycerin SL as needed.  EKG showed nonspecific ST changes.  Troponins are normal. Will check echocardiogram.  Patient's chest pain has currently resolved.  We have consulted cardiology due to her extensive cardiac history.  Continue aspirin  History of paroxysmal A. fib: Status post Rew pacemaker placement on 09/06/2019.  Follow-up with cardiology.CHA2DS2-VASc 5 On Eliquis for anticoagulation.  On metoprolol and flecainide which we will continue.  Hyperlipidemia: She has been intolerant to statins, ezetimibe.  Currently not on lipid-lowering medications.  Hypothyroidism: Continue Synthyroid  Hypertension: Patient was severely hypertensive on presentation, slowly improving.  Continue current medications.  Continue as needed medications for severe hypertension  Anxiety: Continue Ativan as needed  AKI on CKD stage IIIb: Baseline creatinine ranges from 1.2-1.5.  Creatinine of 1.86 on admission.  Most likely associated with dehydration.  Continue gentle IV fluids.  Check BMP tomorrow. She follows with Dr. Justin Mend  History of COPD: Currently not in exacerbation.  Lungs clear on auscultation.  Continue bronchodilators as needed.  Normocytic anemia: Hemoglobin is stable in the range of 10.  Most likely associated with  history with CKD.   Severity of Illness: The appropriate patient status for this patient is OBSERVATION.    DVT prophylaxis: Eliquis Code Status: Full Family Communication: Daughter at bedside Consults called: Cardiology     Shelly Coss MD [facility]  09/18/2020, 3:24 PM

## 2020-09-18 NOTE — ED Triage Notes (Signed)
BIB EMS from home for intermittent 6/10 pressure like chest pain in center of chest with walking around that started last night. No pain with rest. Hx of blood clots. +dizziness. +eliquis.

## 2020-09-18 NOTE — Telephone Encounter (Signed)
error 

## 2020-09-18 NOTE — ED Provider Notes (Signed)
[facility] EMERGENCY DEPARTMENT Provider Note   CSN: [id number] Arrival date & time: 09/18/20  6948     History Chief Complaint  Patient presents with  . Chest Pain    Deborah Shields is a 80 y.o. female with a past medical history significant for hypertension, hyperlipidemia, hypothyroidism, history of PE on chronic Eliquis, CKD,  pacemaker in place who presents to the ED due to central, nonradiating chest pain that started last night while watching TV.  Patient states chest pain has been constant since however, waxes and wanes in severity.  She describes her pain as a pressure-like sensation.  No relation to change in position or exertion.  She has been compliant with her Eliquis with no missed doses.  Admits to feeling "hot" with chest pain however, denies nausea, vomiting, and shortness of breath.  Denies pleuritic chest pain.  Denies lower extremity edema. Denies fever and chills. No recent illness. No history of CVA or MI. Denies tobacco use. She was given ASA 324 by EMS prior to arrival. No aggravating or alleviating factors. She also admits to dizziness which she describes as feeling off balance. Dizziness is worse when going from a sitting to standing position. Denies changes to speech, changes to vision, and unilateral weakness.  History obtained from patient and past medical records. No interpreter used during encounter.   HPI: A 80 year old patient with a history of hypertension and hypercholesterolemia presents for evaluation of chest pain. Initial onset of pain was less than one hour ago. The patient's chest pain is described as heaviness/pressure/tightness and is not worse with exertion. The patient reports some diaphoresis. The patient's chest pain is middle- or left-sided, is not well-localized, is not sharp and does not radiate to the arms/jaw/neck. The patient does not complain of nausea. The patient has no history of stroke, has no history of peripheral  artery disease, has not smoked in the past 90 days, denies any history of treated diabetes, has no relevant family history of coronary artery disease (first degree relative at less than age 63) and does not have an elevated BMI (>=30).   Past Medical History:  Diagnosis Date  . Anxiety   . Aortic insufficiency    mild due to degenerative changes  . Arthritis   . Basal cell carcinoma 2007   [facility] Left leg  . Carotid artery occlusion   . Chronic kidney disease    CRD Stage 3  . Conjunctivitis due to adenovirus, both eyes   . Degenerative joint disease   . Diverticulosis of colon   . Dyspnea    with exertion  . Heart murmur   . Hyperlipidemia    NMR 07/2009: LDL 200 (2260/1233)TG 99, HDL 65. LDL goal =<120, ideally <100. father MI @ 61  . Hypertension   . Hypothyroidism    Dr Wilson Singer  . Microscopic hematuria   . Peripheral neuropathy    compressive in UE bilaterally; Dr Daylene Katayama  . Peripheral vascular disease ([place])    ICA bilat, Dr.Charles fields, [facility]  . Pneumonia    2017  . PONV (postoperative nausea and vomiting)    "Inner ear, does  okay with Scopolamine"  . Pulmonary embolus ([place])   . Rectocele   . [place] spotted fever     Patient Active Problem List   Diagnosis Date Noted  . SSS (sick sinus syndrome) ([place]) 12/06/2019  . Pacemaker 12/06/2019  . Hypercholesterolemia 12/06/2019  . Underweight 12/06/2019  . Pneumonia of right  upper lobe due to methicillin resistant Staphylococcus aureus (MRSA) ([place])   . Pneumonia due to infectious organism   . Community acquired pneumonia 08/16/2018  . Malnutrition of moderate degree 07/16/2017  . Hemoptysis 07/15/2017  . HNP (herniated nucleus pulposus), lumbar 03/20/2017  . Edema of both feet 12/10/2016  . Tick bite 11/21/2015  . Abnormal urine odor 11/21/2015  . Other fatigue 11/21/2015  . Anxiety 08/15/2015  . Encounter for pre-operative cardiovascular clearance 07/10/2015  . Pre-operative clearance 07/07/2015  .  Rotator cuff tear 07/07/2015  . Rectocele, female 08/04/2014  . COPD with chronic bronchitis and emphysema ([place]) 02/14/2014  . Other malaise and fatigue 12/31/2013  . PAF (paroxysmal atrial fibrillation) ([place]) 01/10/2013  . Thrombosed external hemorrhoid 11/26/2012  . Chronic renal insufficiency, stage III (moderate) ([place]) 09/03/2011  . Atherosclerosis of native arteries of the extremities with intermittent claudication 04/04/2011  . Occlusion and stenosis of carotid artery without mention of cerebral infarction 04/04/2011  . PERIPHERAL NEUROPATHY 08/15/2008  . Hypothyroidism (acquired) 08/12/2007  . HYPERLIPIDEMIA 08/12/2007  . Essential hypertension 08/12/2007  . Osteopenia 08/12/2007  . Carotid artery stenosis, asymptomatic 08/11/2007  . Osteoarthritis 08/11/2007  . BASAL CELL CARCINOMA, HX OF 08/11/2007  . FIBROCYSTIC BREAST DISEASE 11/18/2006  . DIVERTICULOSIS, COLON 08/10/2006    Past Surgical History:  Procedure Laterality Date  . ABDOMINAL HYSTERECTOMY  1973   age 21 due to dysfuctional menses; HRT x 25-30 years  . APPENDECTOMY  1952  . basal cell cancer  03/2006   leg  . BILATERAL OOPHORECTOMY  1990   prophylactically (sister had ovarian ca)  . BRONCHIAL BRUSHINGS  08/30/2019   Procedure: BRONCHIAL BRUSHINGS;  Surgeon: Candee Furbish, MD;  Location: [facility];  Service: Endoscopy;;  . BRONCHIAL WASHINGS  08/30/2019   Procedure: BRONCHIAL WASHINGS;  Surgeon: Candee Furbish, MD;  Location: [facility];  Service: Endoscopy;;  . CARPAL TUNNEL RELEASE Bilateral 1989   right  . CATARACT EXTRACTION, BILATERAL  2010   Dr Kathrin Penner  . CHOLECYSTECTOMY  2006  . COLONOSCOPY     Dr Ardis Hughs  . EYE SURGERY Left    Bilateral eye (film removed lt eye 01/09/17)  . LUMBAR LAMINECTOMY/DECOMPRESSION MICRODISCECTOMY Left 03/20/2017   Procedure: LEFT LUMBAR FOURLUMBAR FIVE LAMINOTOMY AND MICROICRODISCECTOMY 1 LEVEL;  Surgeon: Jovita Gamma, MD;  Location: [facility];  Service:  Neurosurgery;  Laterality: Left;  LEFT  . NM MYOVIEW LTD  06/13/2008   low risk scan  . ORTHOPEDIC SURGERY  1989/200/2002/2012   elbows,shoulder surgery x 3, right hand  . PACEMAKER IMPLANT N/A 09/06/2019   Procedure: PACEMAKER IMPLANT;  Surgeon: Evans Lance, MD;  Location: [facility];  Service: Cardiovascular;  Laterality: N/A;  . RECTOCELE REPAIR  2016  . TONSILLECTOMY  1957  . US ECHOCARDIOGRAPHY  05/01/2010   trace MR,AI,TR;EF =>55%  . VIDEO BRONCHOSCOPY Bilateral 07/17/2017   Procedure: VIDEO BRONCHOSCOPY WITHOUT FLUORO;  Surgeon: Rigoberto Noel, MD;  Location: [facility];  Service: Cardiopulmonary;  Laterality: Bilateral;  . VIDEO BRONCHOSCOPY Bilateral 09/02/2017   Procedure: VIDEO BRONCHOSCOPY WITHOUT FLUORO;  Surgeon: Rigoberto Noel, MD;  Location: [facility];  Service: Cardiopulmonary;  Laterality: Bilateral;  . VIDEO BRONCHOSCOPY N/A 08/30/2019   Procedure: VIDEO BRONCHOSCOPY WITHOUT FLUORO;  Surgeon: Candee Furbish, MD;  Location: [facility];  Service: Endoscopy;  Laterality: N/A;     OB History    Gravida  3   Para      Term      Preterm  AB  2   Living  1     SAB  2   IAB      Ectopic      Multiple      Live Births              Family History  Problem Relation Age of Onset  . Heart attack Father 46  . Heart disease Father        Before age 61  . Colon cancer Mother 13  . Stroke Mother 69  . Kidney disease Mother        ? renal calculi; S/P resecton of kidney  . Cancer - Colon Mother 11  . Ovarian cancer Sister   . Other Sister        Valve replacement (aortic & mitral ) in 2 sisters  . Heart disease Sister        Before age 40  . Heart disease Brother        aortic & mitral valve replacement in 2 bro; both had CBAG  . Diabetes Neg Hx   . Breast cancer Neg Hx     Social History   Tobacco Use  . Smoking status: Never Smoker  . Smokeless tobacco: Never Used  Vaping Use  . Vaping Use: Never used  Substance Use  Topics  . Alcohol use: No    Alcohol/week: 0.0 standard drinks  . Drug use: No    Home Medications Prior to Admission medications   Medication Sig Start Date End Date Taking? Authorizing Provider  apixaban (ELIQUIS) 2.5 MG TABS tablet Take 1 tablet (2.5 mg total) by mouth 2 (two) times daily. 06/28/20   Croitoru, Mihai, MD  b complex vitamins tablet Take 1 tablet by mouth daily after supper.     [provider]  cholecalciferol (VITAMIN D3) 25 MCG (1000 UNIT) tablet Take 1,000 Units by mouth daily.    [provider]  cyclobenzaprine (FLEXERIL) 10 MG tablet Take 10 mg by mouth 2 (two) times daily as needed for muscle spasms (scheduled every every).     [provider]  diclofenac sodium (VOLTAREN) 1 % GEL Apply 2-4 g topically 4 (four) times daily as needed (for pain).     [provider]  flecainide (TAMBOCOR) 50 MG tablet TAKE 1 TABLET(50 MG) BY MOUTH EVERY 12 HOURS 03/06/20   Croitoru, Mihai, MD  levothyroxine (SYNTHROID) 75 MCG tablet Take 1 tablet (75 mcg total) by mouth daily before breakfast. Taking the brand "synthroid" 09/07/19   Dessa Phi, DO  LORazepam (ATIVAN) 0.5 MG tablet Take 1 tablet (0.5 mg total) by mouth 3 (three) times daily as needed. Patient taking differently: Take 0.5 mg by mouth at bedtime.  10/17/15   Binnie Rail, MD  meclizine (ANTIVERT) 25 MG tablet Take 1 tablet (25 mg total) by mouth daily. 1/2 every 8 hrs prn for imbalance Patient taking differently: Take 12.5-25 mg by mouth 2 (two) times daily as needed for dizziness.  11/01/10   Hendricks Limes, MD  metoprolol succinate (TOPROL-XL) 50 MG 24 hr tablet TAKE 1 TABLET(50 MG) BY MOUTH DAILY 03/06/20   Croitoru, Mihai, MD  senna (SENOKOT) 8.6 MG tablet Take 1 tablet by mouth every other day.     [provider]  traMADol (ULTRAM) 50 MG tablet 1/2-1 by mouth once daily as needed. Patient taking differently: Take 25-50 mg by mouth 2 (two) times daily as needed for  moderate pain or severe pain (1 tablet by mouth  scheduled each bedtime.).  10/17/15   Binnie Rail, MD    Allergies    Aspirin, Lovastatin, Benazepril hcl, Paroxetine, Tape, Bupropion hcl, Ezetimibe, Fenofibrate, and Pravastatin sodium  Review of Systems   Review of Systems  Constitutional: Negative for chills and fever.  Respiratory: Negative for shortness of breath.   Cardiovascular: Positive for chest pain.  Gastrointestinal: Negative for abdominal pain, nausea and vomiting.  All other systems reviewed and are negative.   Physical Exam Updated Vital Signs BP (!) 186/100   Pulse 66   Temp 98.9 F (37.2 C) (Oral)   Resp (!) 21   Ht 5\' 6"  (1.676 m)   Wt 47.6 kg   SpO2 98%   BMI 16.95 kg/m   Physical Exam Vitals and nursing note reviewed.  Constitutional:      General: She is not in acute distress. HENT:     Head: Normocephalic.  Eyes:     Pupils: Pupils are equal, round, and reactive to light.  Cardiovascular:     Rate and Rhythm: Normal rate and regular rhythm.     Pulses: Normal pulses.     Heart sounds: Normal heart sounds. No murmur heard. No friction rub. No gallop.   Pulmonary:     Effort: Pulmonary effort is normal.     Breath sounds: Normal breath sounds.  Abdominal:     General: Abdomen is flat. Bowel sounds are normal. There is no distension.     Palpations: Abdomen is soft.     Tenderness: There is no abdominal tenderness. There is no guarding or rebound.  Musculoskeletal:     Cervical back: Neck supple.     Comments: No lower extremity edema. Negative homan sign bilaterally.  Skin:    General: Skin is warm and dry.  Neurological:     General: No focal deficit present.  Psychiatric:        Mood and Affect: Mood normal.        Behavior: Behavior normal.     ED Results / Procedures / Treatments   Labs (all labs ordered are listed, but only abnormal results are displayed) Labs Reviewed  CBC WITH DIFFERENTIAL/PLATELET - Abnormal; Notable for  the following components:      Result Value   RBC 3.47 (*)    Hemoglobin 10.6 (*)    HCT 31.7 (*)    Monocytes Absolute 1.3 (*)    All other components within normal limits  COMPREHENSIVE METABOLIC PANEL - Abnormal; Notable for the following components:   Glucose, Bld 100 (*)    BUN 24 (*)    Creatinine, Ser 1.86 (*)    Calcium 8.7 (*)    Total Protein 6.2 (*)    Albumin 2.8 (*)    GFR, Estimated 27 (*)    All other components within normal limits  RESP PANEL BY RT-PCR (FLU A&B, COVID) ARPGX2  TROPONIN I (HIGH SENSITIVITY)  TROPONIN I (HIGH SENSITIVITY)    EKG EKG Interpretation  Date/Time:  Monday September 18 2020 10:04:58 EDT Ventricular Rate:  70 PR Interval:    QRS Duration: 103 QT Interval:  425 QTC Calculation: 459 R Axis:   -30 Text Interpretation: Sinus rhythm Left atrial enlargement Left axis deviation Since last tracing Nonspecific T wave changes, flattened T waves are improved Confirmed by Calvert Cantor [phone]) on 09/18/2020 10:17:25 AM   Radiology CT Head Wo Contrast  Result Date: 09/18/2020 CLINICAL DATA:  Dizziness, nonspecific. Additional history provided: Dizziness, on blood thinners. EXAM: CT HEAD WITHOUT  CONTRAST TECHNIQUE: Contiguous axial images were obtained from the base of the skull through the vertex without intravenous contrast. COMPARISON:  Brain MRI 06/29/2004. FINDINGS: Brain: Mild cerebral atrophy. Moderate patchy and ill-defined hypoattenuation within the cerebral white matter is nonspecific, but compatible with chronic small vessel ischemic disease. There is no acute intracranial hemorrhage. No demarcated cortical infarct. No extra-axial fluid collection. No evidence of intracranial mass. No midline shift. Vascular: No hyperdense vessel.  Atherosclerotic calcifications. Skull: Normal. Negative for fracture or focal lesion. Sinuses/Orbits: Visualized orbits show no acute finding. Tiny right sphenoid sinus mucous retention cyst. IMPRESSION: No evidence  of acute intracranial abnormality. Moderate cerebral white matter chronic small vessel ischemic disease. Mild generalized cerebral atrophy. Incidentally noted small right sphenoid sinus mucous retention cyst. Electronically Signed   By: Kellie Simmering DO   On: 09/18/2020 13:25   DG Chest Portable 1 View  Result Date: 09/18/2020 CLINICAL DATA:  Chest pain and dizziness since last night. EXAM: PORTABLE CHEST 1 VIEW COMPARISON:  Radiographs 10/01/2019.  CT 08/28/2019. FINDINGS: 1010 hours. Left subclavian pacemaker leads appear unchanged at the level of the right atrium and right ventricle. The heart size and mediastinal contours are stable with aortic atherosclerosis. Mild scarring is present at both lung apices. The lungs are otherwise clear. There is no pleural effusion or pneumothorax. Bilateral skin folds are noted. Evidence of a chronic rotator cuff tear on the left. No acute osseous findings. IMPRESSION: Stable chest. No active cardiopulmonary process. Electronically Signed   By: Richardean Sale M.D.   On: 09/18/2020 10:25    Procedures Procedures   Medications Ordered in ED Medications  aspirin chewable tablet 81 mg (81 mg Oral Not Given 09/18/20 1133)  nitroGLYCERIN (NITROSTAT) SL tablet 0.4 mg (has no administration in time range)  sodium chloride 0.9 % bolus 1,000 mL (0 mLs Intravenous Stopped 09/18/20 1413)    ED Course  I have reviewed the triage vital signs and the nursing notes.  Pertinent labs & imaging results that were available during my care of the patient were reviewed by me and considered in my medical decision making (see chart for details).  Clinical Course as of 09/18/20 1503  Mon Sep 18, 2020  1138 Troponin I (High Sensitivity): 16 [CA]  1210 Creatinine(!): 1.86 [CA]  1210 BUN(!): 24 [CA]    Clinical Course User Index [CA] Suzy Bouchard, PA-C   MDM Rules/Calculators/A&P HEAR Score: 18                       80 year old female presents to the ED due to chest  pain that started last night while watching TV. History of PE/DVT on chronic Eliquis which she has been compliant with. No previous CVA or MI.  Upon arrival, stable vitals.  Patient in no acute distress and non-ill-appearing.  Physical exam reassuring.  Normal neurological exam.  No lower extremity edema.  Lungs clear to auscultation bilaterally.  Routine labs, troponin, EKG, chest x-ray ordered to rule out cardiac etiology.  CT head due to dizziness to rule out acute intracranial abnormalities. Given normal neurological exam, low suspicion for CVA. Dizziness could be related to orthostatic hypotension given worse when going from sitting to standing. ASA given by EMS prior to arrival. Lower suspicion for PE due to compliance with Eliquis and no clinical signs of DVT on exam. Discussed case with Dr. Karle Starch who evaluated patient at bedside and agrees with assessment and plan.   Initial troponin normal at 16.  Will obtain delta troponin to rule out ACS.  CBC reassuring with no leukocytosis.  Mild anemia with hemoglobin at 10.6.  CMP significant for elevated creatinine and BUN at 1.86 and 24 which appears slightly worse than baseline. IVFs given.  EKG personally reviewed which demonstrates normal sinus rhythm with nonspecific T wave abnormalities.  Chest x-ray personally reviewed which is negative for signs of pneumonia, pneumothorax, or widened mediastinum.  CT head personally reviewed which is negative for any acute abnormalities. Given patient's risk factors, will consult [facility] for admission for chest pain rule out.  [facility] agreeable to admit. Final Clinical Impression(s) / ED Diagnoses Final diagnoses:  None    Rx / DC Orders ED Discharge Orders    None       Karie Kirks 09/18/20 1513    Truddie Hidden, MD 09/19/20 1459

## 2020-09-19 ENCOUNTER — Other Ambulatory Visit: Payer: Self-pay | Admitting: Cardiology

## 2020-09-19 ENCOUNTER — Observation Stay (HOSPITAL_BASED_OUTPATIENT_CLINIC_OR_DEPARTMENT_OTHER): Payer: Medicare Other

## 2020-09-19 ENCOUNTER — Ambulatory Visit: Payer: Medicare Other | Admitting: Adult Health

## 2020-09-19 DIAGNOSIS — R079 Chest pain, unspecified: Secondary | ICD-10-CM | POA: Diagnosis not present

## 2020-09-19 DIAGNOSIS — E785 Hyperlipidemia, unspecified: Secondary | ICD-10-CM

## 2020-09-19 DIAGNOSIS — I350 Nonrheumatic aortic (valve) stenosis: Secondary | ICD-10-CM | POA: Diagnosis not present

## 2020-09-19 DIAGNOSIS — I119 Hypertensive heart disease without heart failure: Secondary | ICD-10-CM | POA: Diagnosis not present

## 2020-09-19 DIAGNOSIS — R0789 Other chest pain: Secondary | ICD-10-CM

## 2020-09-19 DIAGNOSIS — I351 Nonrheumatic aortic (valve) insufficiency: Secondary | ICD-10-CM | POA: Diagnosis not present

## 2020-09-19 DIAGNOSIS — I480 Paroxysmal atrial fibrillation: Secondary | ICD-10-CM | POA: Diagnosis not present

## 2020-09-19 DIAGNOSIS — I10 Essential (primary) hypertension: Secondary | ICD-10-CM | POA: Diagnosis not present

## 2020-09-19 LAB — BASIC METABOLIC PANEL
Anion gap: 9 (ref 5–15)
BUN: 19 mg/dL (ref 8–23)
CO2: 24 mmol/L (ref 22–32)
Calcium: 8.8 mg/dL — ABNORMAL LOW (ref 8.9–10.3)
Chloride: 104 mmol/L (ref 98–111)
Creatinine, Ser: 1.48 mg/dL — ABNORMAL HIGH (ref 0.44–1.00)
GFR, Estimated: 35 mL/min — ABNORMAL LOW (ref >60)
Glucose, Bld: 100 mg/dL — ABNORMAL HIGH (ref 70–99)
Potassium: 4.3 mmol/L (ref 3.5–5.1)
Sodium: 137 mmol/L (ref 135–145)

## 2020-09-19 LAB — CBC
HCT: 36.6 % (ref 36.0–46.0)
Hemoglobin: 11.9 g/dL — ABNORMAL LOW (ref 12.0–15.0)
MCH: 29.8 pg (ref 26.0–34.0)
MCHC: 32.5 g/dL (ref 30.0–36.0)
MCV: 91.7 fL (ref 80.0–100.0)
Platelets: 229 10*3/uL (ref 150–400)
RBC: 3.99 MIL/uL (ref 3.87–5.11)
RDW: 13.4 % (ref 11.5–15.5)
WBC: 7.4 10*3/uL (ref 4.0–10.5)
nRBC: 0 % (ref 0.0–0.2)

## 2020-09-19 LAB — LIPID PANEL
Cholesterol: 194 mg/dL (ref 0–200)
HDL: 59 mg/dL (ref >40)
LDL Cholesterol: 124 mg/dL — ABNORMAL HIGH (ref 0–99)
Total CHOL/HDL Ratio: 3.3 RATIO
Triglycerides: 54 mg/dL (ref <150)
VLDL: 11 mg/dL (ref 0–40)

## 2020-09-19 LAB — ECHOCARDIOGRAM COMPLETE
AR max vel: 1.14 cm2
AV Area VTI: 1.22 cm2
AV Area mean vel: 1.16 cm2
AV Mean grad: 10 mmHg
AV Peak grad: 18.7 mmHg
Ao pk vel: 2.17 m/s
Area-P 1/2: 4.06 cm2
Calc EF: 68.5 %
Height: 66 in
P 1/2 time: 430 msec
S' Lateral: 1.2 cm
Single Plane A2C EF: 70.3 %
Single Plane A4C EF: 67.6 %
Weight: 1680 oz

## 2020-09-19 LAB — RESP PANEL BY RT-PCR (FLU A&B, COVID) ARPGX2
Influenza A by PCR: NEGATIVE
Influenza B by PCR: NEGATIVE
SARS Coronavirus 2 by RT PCR: NEGATIVE

## 2020-09-19 MED ORDER — AMLODIPINE BESYLATE 5 MG PO TABS
10.0000 mg | ORAL_TABLET | Freq: Every day | ORAL | Status: DC
  Administered 2020-09-19: 10 mg via ORAL
  Filled 2020-09-19: qty 2

## 2020-09-19 MED ORDER — ACETAMINOPHEN 325 MG PO TABS
650.0000 mg | ORAL_TABLET | Freq: Four times a day (QID) | ORAL | Status: DC | PRN
  Administered 2020-09-19: 650 mg via ORAL
  Filled 2020-09-19: qty 2

## 2020-09-19 MED ORDER — HYDRALAZINE HCL 25 MG PO TABS: 25.0000 mg | ORAL_TABLET | Freq: Three times a day (TID) | ORAL | 0 refills | Status: AC | PRN

## 2020-09-19 MED ORDER — LOSARTAN POTASSIUM 25 MG PO TABS: 25.0000 mg | ORAL_TABLET | Freq: Every day | ORAL | 1 refills | Status: DC

## 2020-09-19 NOTE — Discharge Instructions (Addendum)
Information on my medicine - ELIQUIS (apixaban) Why was Eliquis prescribed for you? Eliquis was prescribed for you to reduce the risk of a blood clot forming that can cause a stroke if you have a medical condition called atrial fibrillation (a type of irregular heartbeat).  What do You need to know about Eliquis ? Take your Eliquis TWICE DAILY - one tablet in the morning and one tablet in the evening with or without food. If you have difficulty swallowing the tablet whole please discuss with your pharmacist how to take the medication safely.  Take Eliquis exactly as prescribed by your doctor and DO NOT stop taking Eliquis without talking to the doctor who prescribed the medication.  Stopping may increase your risk of developing a stroke.  Refill your prescription before you run out.  After discharge, you should have regular check-up appointments with your healthcare provider that is prescribing your Eliquis.  In the future your dose may need to be changed if your kidney function or weight changes by a significant amount or as you get older.  What do you do if you miss a dose? If you miss a dose, take it as soon as you remember on the same day and resume taking twice daily.  Do not take more than one dose of ELIQUIS at the same time to make up a missed dose.  Important Safety Information A possible side effect of Eliquis is bleeding. You should call your healthcare provider right away if you experience any of the following: ? Bleeding from an injury or your nose that does not stop. ? Unusual colored urine (red or dark brown) or unusual colored stools (red or black). ? Unusual bruising for unknown reasons. ? A serious fall or if you hit your head (even if there is no bleeding).  Some medicines may interact with Eliquis and might increase your risk of bleeding or clotting while on Eliquis. To help avoid this, consult your healthcare provider or pharmacist prior to using any new  prescription or non-prescription medications, including herbals, vitamins, non-steroidal anti-inflammatory drugs (NSAIDs) and supplements.  This website has more information on Eliquis (apixaban): http://www.eliquis.com/eliquis/home    You have a Stress Test scheduled at [facility]. Your doctor has ordered this test to check the blood flow in your heart arteries.  Please arrive 15 minutes early for paperwork. The whole test will take several hours. You may want to bring reading material to remain occupied while undergoing different parts of the test.  Instructions:  No food/drink after midnight the night before.  It is OK to take your morning meds with a sip of water EXCEPT for those types of medicines listed below or otherwise instructed.  No caffeine/decaf products 24 hours before, including medicines such as Excedrin or Goody Powders. Call if there are any questions.   Wear comfortable clothes and shoes.   Special Medication Instructions:  Beta blockers such as metoprolol (Lopressor/Toprol XL), atenolol (Tenormin), carvedilol (Coreg), nebivolol (Bystolic), bisoprolol (Zebeta), propranolol (Inderal) should not be taken for 24 hours before the test.  Calcium channel blockers such as diltiazem (Cardizem) or verapmil (Calan) should not be taken for 24 hours before the test.  Remove nitroglycerin patches and do not take nitrate preparations such as Imdur/isosorbide the day of your test.  No Persantine/Theophylline or Aggrenox medicines should be used within 24 hours of the test.   If you are diabetic, please ask which medications to hold the day of the test.  What To  Expect: When you arrive in the lab, the technician will inject a small amount of radioactive tracer into your arm through an IV while you are resting quietly. This helps [place] to form pictures of your heart. You will likely only feel a sting from the IV. After a waiting period, resting pictures will  be obtained under a big camera. These are the "before" pictures.  Next, you will be prepped for the stress portion of the test. This may include either walking on a treadmill or receiving a medicine that helps to dilate blood vessels in your heart to simulate the effect of exercise on your heart. If you are walking on a treadmill, you will walk at different paces to try to get your heart rate to a goal number that is based on your age. If your doctor has chosen the pharmacologic test, then you will receive a medicine through your IV that may cause temporary nausea, flushing, shortness of breath and sometimes chest discomfort or vomiting. This is typically short-lived and usually resolves quickly. If you experience symptoms, that does not automatically mean the test is abnormal. Some patients do not experience any symptoms at all. Your blood pressure and heart rate will be monitored, and we will be watching your EKG on a computer screen for any changes. During this portion of the test, the radiologist will inject another small amount of radioactive tracer into your IV. After a waiting period, you will undergo a second set of pictures. These are the "after" pictures.  The doctor reading the test will compare the before-and-after images to look for evidence of heart blockages or heart weakness. The test usually takes 1 day to complete, but in certain instances (for example, if a patient is over a certain weight limit), the test may be done over the span of 2 days.

## 2020-09-19 NOTE — Discharge Summary (Signed)
Physician Discharge Summary  Deborah Shields BJY:[id number] DOB: 25-Dec-1940 DOA: 09/18/2020  PCP: Lajean Manes, MD  Admit date: 09/18/2020 Discharge date: 09/19/2020  Admitted From: Home Disposition:  Home  Discharge Condition:Stable CODE STATUS:FULL Diet recommendation: Heart Healthy    Brief/Interim Summary: HPI: Deborah Shields is a 80 y.o. female with medical history significant of paroxysmal A. fib status post pacemaker placement, hypertension, PE,  hyperlipidemia, CKD stage IIIb who presented to the emergency department with complaints of chest pain that started last night. Patient described chest pain as central, nonradiating, constant and started while watching TV last night. Not associated with exertion, pressure-like.Denies any cough or shortness of breath.  She lives alone.  Her daughter lives next to her.  She has history of A. fib and is status post pacemaker placement, follows with cardiology.  She follows regularly with her PCP.  She does not report missing her medications.  There was no history of fever, chills, abdominal pain, dysuria, diarrhea, vomiting, nausea, hematochezia or melena.  She is a past smoker. She was given aspirin 324 mg by EMS prior to arrival.  Patient was also complaining of dizziness but found to be hypertensive on presentation.  No history of weakness, change in vision or speech problem.   Physical examination was relatively unremarkable, she looked comfortable.   ED Course: Remained mostly hemodynamically stable though mildly hypertensive.  EKG did not show any significant ST changes.  Lab work showed creatinine of 1.86.  Chest x-ray did not show any acute findings.  CT head did not show any acute intracranial abnormalities.  Patient was admitted for chest pain rule out.  Hospital course:  Her hospital course remained stable.  Troponins were negative.  EKG did not show any ischemic changes.  Her chest pain resolved after admission.   Cardiology was consulted.  Echo was done.  Her blood pressure improved during the hospitalization.  She was seen by physical therapy and recommended home health.  Cardiology cleared her for discharge.  Cards recommend to follow-up as an outpatient for possible outpatient stress test. Patient AKI improved with IV fluids.   Patient is medically stable for discharge home today. We adjusted blood pressure medications.    Discharge Diagnoses:  Principal Problem:   Atypical chest pain Active Problems:   Hypothyroidism (acquired)   Hyperlipidemia with target LDL less than 100   Essential hypertension   PAF (paroxysmal atrial fibrillation) (HCC)   COPD with chronic bronchitis and emphysema ([place])   Hypertension, accelerated with heart disease, without CHF    Discharge Instructions  Discharge Instructions    Diet - low sodium heart healthy   Complete by: As directed    Discharge instructions   Complete by: As directed    1)Please take prescribed medications as instructed 2)Monitor your blood pressure at home 3)Follow up with your PCP in a week.  Follow-up with your cardiologist in 2 weeks.   Increase activity slowly   Complete by: As directed      Allergies as of 09/19/2020      Reactions   Aspirin Nausea Only, Other (See Comments)   REACTION: GI upset  ( pt can take 81 mg but NOT   325 mg ASA )   Lovastatin Nausea Only, Other (See Comments)   REACTION: nausea   Benazepril Hcl Other (See Comments)   Unknown   Paroxetine Other (See Comments)   Unknown   Tape Other (See Comments)   Bupropion Hcl Other (See Comments)   REACTION: tinnitis  Ezetimibe Nausea And Vomiting, Other (See Comments)   REACTION: GI symptoms   Fenofibrate Other (See Comments)   Myalgia   Pravastatin Sodium Other (See Comments)   REACTION: elevated CPK - Muscle's in bilateral Leg      Medication List    TAKE these medications   apixaban 2.5 MG Tabs tablet Commonly known as: ELIQUIS Take 1 tablet  (2.5 mg total) by mouth 2 (two) times daily.   b complex vitamins tablet Take 1 tablet by mouth daily after supper.   cholecalciferol 25 MCG (1000 UNIT) tablet Commonly known as: VITAMIN D3 Take 1,000 Units by mouth daily.   cyclobenzaprine 10 MG tablet Commonly known as: FLEXERIL Take 10 mg by mouth 2 (two) times daily as needed for muscle spasms (scheduled every every).   diclofenac sodium 1 % Gel Commonly known as: VOLTAREN Apply 2-4 g topically 4 (four) times daily as needed (for pain).   flecainide 50 MG tablet Commonly known as: TAMBOCOR TAKE 1 TABLET(50 MG) BY MOUTH EVERY 12 HOURS What changed: See the new instructions.   hydrALAZINE 25 MG tablet Commonly known as: APRESOLINE Take 1 tablet (25 mg total) by mouth every 8 (eight) hours as needed (if systolic bp more than 371 mmhg).   levothyroxine 75 MCG tablet Commonly known as: SYNTHROID Take 1 tablet (75 mcg total) by mouth daily before breakfast. Taking the brand "synthroid"   LORazepam 0.5 MG tablet Commonly known as: ATIVAN Take 1 tablet (0.5 mg total) by mouth 3 (three) times daily as needed. What changed:   when to take this  reasons to take this   losartan 25 MG tablet Commonly known as: Cozaar Take 1 tablet (25 mg total) by mouth daily.   meclizine 25 MG tablet Commonly known as: ANTIVERT Take 1 tablet (25 mg total) by mouth daily. 1/2 every 8 hrs prn for imbalance What changed:   how much to take  when to take this  reasons to take this  additional instructions   metoprolol succinate 50 MG 24 hr tablet Commonly known as: TOPROL-XL TAKE 1 TABLET(50 MG) BY MOUTH DAILY What changed: See the new instructions.   senna 8.6 MG tablet Commonly known as: SENOKOT Take 1 tablet by mouth every other day.   traMADol 50 MG tablet Commonly known as: ULTRAM 1/2-1 by mouth once daily as needed. What changed:   how much to take  how to take this  when to take this  reasons to take  this  additional instructions       Follow-up Information    Stoneking, Hal, MD. Schedule an appointment as soon as possible for a visit in 1 week(s).   Specialty: Internal Medicine Contact information: [address] [place] [address] [phone]        Croitoru, Dani Gobble, MD. Schedule an appointment as soon as possible for a visit in 2 week(s).   Specialty: Cardiology Contact information: [address] [place] [address] [phone]              Allergies  Allergen Reactions  . Aspirin Nausea Only and Other (See Comments)    REACTION: GI upset  ( pt can take 81 mg but NOT   325 mg ASA )  . Lovastatin Nausea Only and Other (See Comments)    REACTION: nausea  . Benazepril Hcl Other (See Comments)    Unknown  . Paroxetine Other (See Comments)    Unknown  . Tape Other (See Comments)  .  Bupropion Hcl Other (See Comments)    REACTION: tinnitis  . Ezetimibe Nausea And Vomiting and Other (See Comments)    REACTION: GI symptoms  . Fenofibrate Other (See Comments)    Myalgia   . Pravastatin Sodium Other (See Comments)    REACTION: elevated CPK - Muscle's in bilateral Leg    Consultations:  Cardiology   Procedures/Studies: CT Head Wo Contrast  Result Date: 09/18/2020 CLINICAL DATA:  Dizziness, nonspecific. Additional history provided: Dizziness, on blood thinners. EXAM: CT HEAD WITHOUT CONTRAST TECHNIQUE: Contiguous axial images were obtained from the base of the skull through the vertex without intravenous contrast. COMPARISON:  Brain MRI 06/29/2004. FINDINGS: Brain: Mild cerebral atrophy. Moderate patchy and ill-defined hypoattenuation within the cerebral white matter is nonspecific, but compatible with chronic small vessel ischemic disease. There is no acute intracranial hemorrhage. No demarcated cortical infarct. No extra-axial fluid collection. No evidence of intracranial mass. No midline shift. Vascular: No hyperdense vessel.   Atherosclerotic calcifications. Skull: Normal. Negative for fracture or focal lesion. Sinuses/Orbits: Visualized orbits show no acute finding. Tiny right sphenoid sinus mucous retention cyst. IMPRESSION: No evidence of acute intracranial abnormality. Moderate cerebral white matter chronic small vessel ischemic disease. Mild generalized cerebral atrophy. Incidentally noted small right sphenoid sinus mucous retention cyst. Electronically Signed   By: Kellie Simmering DO   On: 09/18/2020 13:25   DG Chest Portable 1 View  Result Date: 09/18/2020 CLINICAL DATA:  Chest pain and dizziness since last night. EXAM: PORTABLE CHEST 1 VIEW COMPARISON:  Radiographs 10/01/2019.  CT 08/28/2019. FINDINGS: 1010 hours. Left subclavian pacemaker leads appear unchanged at the level of the right atrium and right ventricle. The heart size and mediastinal contours are stable with aortic atherosclerosis. Mild scarring is present at both lung apices. The lungs are otherwise clear. There is no pleural effusion or pneumothorax. Bilateral skin folds are noted. Evidence of a chronic rotator cuff tear on the left. No acute osseous findings. IMPRESSION: Stable chest. No active cardiopulmonary process. Electronically Signed   By: Richardean Sale M.D.   On: 09/18/2020 10:25   CUP PACEART REMOTE DEVICE CHECK  Result Date: 09/06/2020 Scheduled remote reviewed. Normal device function.  6 new AMS events; longest 35 minutes; EGMs suggests A. Fib; V rate histogram shows ~85% binned >110 bpm. History of PAF and on apixaban, flecainide and metoprolol. Next remote 91 days. HB      Subjective: Patient seen and examined the bedside this morning.  Hemodynamically stable for discharge.  Discharge Exam: Vitals:   09/19/20 1445 09/19/20 1500  BP: (!) 98/56 101/67  Pulse: (!) 58 64  Resp: (!) 24 18  Temp:    SpO2: 99% 97%   Vitals:   09/19/20 1415 09/19/20 1430 09/19/20 1445 09/19/20 1500  BP: 110/64 96/62 (!) 98/56 101/67  Pulse: 64 63  (!) 58 64  Resp: 14 15 (!) 24 18  Temp:      TempSrc:      SpO2: 98% 99% 99% 97%  Weight:      Height:        General: Pt is alert, awake, not in acute distress Cardiovascular: RRR, S1/S2 +, no rubs, no gallops Respiratory: CTA bilaterally, no wheezing, no rhonchi Abdominal: Soft, NT, ND, bowel sounds + Extremities: no edema, no cyanosis    The results of significant diagnostics from this hospitalization (including imaging, microbiology, ancillary and laboratory) are listed below for reference.     Microbiology: Recent Results (from the past 240 hour(s))  Resp Panel by  RT-PCR (Flu A&B, Covid) Nasopharyngeal Swab     Status: None   Collection Time: 09/19/20  4:56 AM   Specimen: Nasopharyngeal Swab; Nasopharyngeal(NP) swabs in vial transport medium  Result Value Ref Range Status   SARS Coronavirus 2 by RT PCR NEGATIVE NEGATIVE Final    Comment: (NOTE) SARS-CoV-2 target nucleic acids are NOT DETECTED.  The SARS-CoV-2 RNA is generally detectable in upper respiratory specimens during the acute phase of infection. The lowest concentration of SARS-CoV-2 viral copies this assay can detect is 138 copies/mL. A negative result does not preclude SARS-Cov-2 infection and should not be used as the sole basis for treatment or other patient management decisions. A negative result may occur with  improper specimen collection/handling, submission of specimen other than nasopharyngeal swab, presence of viral mutation(s) within the areas targeted by this assay, and inadequate number of viral copies(<138 copies/mL). A negative result must be combined with clinical observations, patient history, and epidemiological information. The expected result is Negative.  Fact Sheet for Patients:  EntrepreneurPulse.com.au  Fact Sheet for Healthcare Providers:  IncredibleEmployment.be  This test is no t yet approved or cleared by the [place] FDA and  has been  authorized for detection and/or diagnosis of SARS-CoV-2 by FDA under an Emergency Use Authorization (EUA). This EUA will remain  in effect (meaning this test can be used) for the duration of the COVID-19 declaration under Section 564(b)(1) of the Act, 21 U.S.C.section 360bbb-3(b)(1), unless the authorization is terminated  or revoked sooner.       Influenza A by PCR NEGATIVE NEGATIVE Final   Influenza B by PCR NEGATIVE NEGATIVE Final    Comment: (NOTE) The Xpert Xpress SARS-CoV-2/FLU/RSV plus assay is intended as an aid in the diagnosis of influenza from Nasopharyngeal swab specimens and should not be used as a sole basis for treatment. Nasal washings and aspirates are unacceptable for Xpert Xpress SARS-CoV-2/FLU/RSV testing.  Fact Sheet for Patients: EntrepreneurPulse.com.au  Fact Sheet for Healthcare Providers: IncredibleEmployment.be  This test is not yet approved or cleared by the [place] FDA and has been authorized for detection and/or diagnosis of SARS-CoV-2 by FDA under an Emergency Use Authorization (EUA). This EUA will remain in effect (meaning this test can be used) for the duration of the COVID-19 declaration under Section 564(b)(1) of the Act, 21 U.S.C. section 360bbb-3(b)(1), unless the authorization is terminated or revoked.  Performed at [facility], [place] [address] [place], [place] [address]      Labs: BNP (last 3 results) No results for input(s): BNP in the last 8760 hours. Basic Metabolic Panel: Recent Labs  Lab 09/18/20 1001 09/19/20 0856  NA 138 137  K 4.0 4.3  CL 105 104  CO2 25 24  GLUCOSE 100* 100*  BUN 24* 19  CREATININE 1.86* 1.48*  CALCIUM 8.7* 8.8*   Liver Function Tests: Recent Labs  Lab 09/18/20 1001  AST 28  ALT 13  ALKPHOS 74  BILITOT 0.9  PROT 6.2*  ALBUMIN 2.8*   No results for input(s): LIPASE, AMYLASE in the last 168 hours. No results for input(s): AMMONIA in the last  168 hours. CBC: Recent Labs  Lab 09/18/20 1001 09/19/20 0717  WBC 9.5 7.4  NEUTROABS 7.1  --   HGB 10.6* 11.9*  HCT 31.7* 36.6  MCV 91.4 91.7  PLT 192 229   Cardiac Enzymes: No results for input(s): CKTOTAL, CKMB, CKMBINDEX, TROPONINI in the last 168 hours. BNP: Invalid input(s): POCBNP CBG: No results for input(s): GLUCAP in  the last 168 hours. D-Dimer No results for input(s): DDIMER in the last 72 hours. Hgb A1c Recent Labs    09/18/20 1001  HGBA1C 5.5   Lipid Profile Recent Labs    09/19/20 0856  CHOL 194  HDL 59  LDLCALC 124*  TRIG 54  CHOLHDL 3.3   Thyroid function studies No results for input(s): TSH, T4TOTAL, T3FREE, THYROIDAB in the last 72 hours.  Invalid input(s): FREET3 Anemia work up No results for input(s): VITAMINB12, FOLATE, FERRITIN, TIBC, IRON, RETICCTPCT in the last 72 hours. Urinalysis    Component Value Date/Time   COLORURINE YELLOW 08/17/2018 0505   APPEARANCEUR CLEAR 08/17/2018 0505   LABSPEC 1.024 08/17/2018 0505   PHURINE 6.0 08/17/2018 0505   GLUCOSEU NEGATIVE 08/17/2018 0505   GLUCOSEU NEGATIVE 11/21/2015 1156   HGBUR MODERATE (A) 08/17/2018 0505   BILIRUBINUR NEGATIVE 08/17/2018 0505   BILIRUBINUR Neg 09/07/2012 1426   KETONESUR NEGATIVE 08/17/2018 0505   PROTEINUR NEGATIVE 08/17/2018 0505   UROBILINOGEN 0.2 11/21/2015 1156   NITRITE NEGATIVE 08/17/2018 0505   LEUKOCYTESUR NEGATIVE 08/17/2018 0505   Sepsis Labs Invalid input(s): PROCALCITONIN,  WBC,  LACTICIDVEN Microbiology Recent Results (from the past 240 hour(s))  Resp Panel by RT-PCR (Flu A&B, Covid) Nasopharyngeal Swab     Status: None   Collection Time: 09/19/20  4:56 AM   Specimen: Nasopharyngeal Swab; Nasopharyngeal(NP) swabs in vial transport medium  Result Value Ref Range Status   SARS Coronavirus 2 by RT PCR NEGATIVE NEGATIVE Final    Comment: (NOTE) SARS-CoV-2 target nucleic acids are NOT DETECTED.  The SARS-CoV-2 RNA is generally detectable in upper  respiratory specimens during the acute phase of infection. The lowest concentration of SARS-CoV-2 viral copies this assay can detect is 138 copies/mL. A negative result does not preclude SARS-Cov-2 infection and should not be used as the sole basis for treatment or other patient management decisions. A negative result may occur with  improper specimen collection/handling, submission of specimen other than nasopharyngeal swab, presence of viral mutation(s) within the areas targeted by this assay, and inadequate number of viral copies(<138 copies/mL). A negative result must be combined with clinical observations, patient history, and epidemiological information. The expected result is Negative.  Fact Sheet for Patients:  EntrepreneurPulse.com.au  Fact Sheet for Healthcare Providers:  IncredibleEmployment.be  This test is no t yet approved or cleared by the [place] FDA and  has been authorized for detection and/or diagnosis of SARS-CoV-2 by FDA under an Emergency Use Authorization (EUA). This EUA will remain  in effect (meaning this test can be used) for the duration of the COVID-19 declaration under Section 564(b)(1) of the Act, 21 U.S.C.section 360bbb-3(b)(1), unless the authorization is terminated  or revoked sooner.       Influenza A by PCR NEGATIVE NEGATIVE Final   Influenza B by PCR NEGATIVE NEGATIVE Final    Comment: (NOTE) The Xpert Xpress SARS-CoV-2/FLU/RSV plus assay is intended as an aid in the diagnosis of influenza from Nasopharyngeal swab specimens and should not be used as a sole basis for treatment. Nasal washings and aspirates are unacceptable for Xpert Xpress SARS-CoV-2/FLU/RSV testing.  Fact Sheet for Patients: EntrepreneurPulse.com.au  Fact Sheet for Healthcare Providers: IncredibleEmployment.be  This test is not yet approved or cleared by the [place] FDA and has been  authorized for detection and/or diagnosis of SARS-CoV-2 by FDA under an Emergency Use Authorization (EUA). This EUA will remain in effect (meaning this test can be used) for the duration of the COVID-19 declaration under  Section 564(b)(1) of the Act, 21 U.S.C. section 360bbb-3(b)(1), unless the authorization is terminated or revoked.  Performed at [facility], [place] [address] [place],  [address]     Please note: You were cared for by a hospitalist during your hospital stay. Once you are discharged, your primary care physician will handle any further medical issues. Please note that NO REFILLS for any discharge medications will be authorized once you are discharged, as it is imperative that you return to your primary care physician (or establish a relationship with a primary care physician if you do not have one) for your post hospital discharge needs so that they can reassess your need for medications and monitor your lab values.    Time coordinating discharge: 40 minutes  SIGNED:   Shelly Coss, MD  [facility] 09/19/2020, 3:46 PM Pager [phone]  If 7PM-7AM, please contact night-coverage [email or web address] Password [email or web address]

## 2020-09-19 NOTE — ED Notes (Signed)
Report given to Michiel Sites, RN of [phone]

## 2020-09-19 NOTE — Progress Notes (Signed)
PT Cancellation Note  Patient Details Name: Deborah Shields MRN: [id number] DOB: 1941-01-24   Cancelled Treatment:    Reason Eval/Treat Not Completed: Medical issues which prohibited therapy. Work up for concern of possible aortic dissection. Will defer PT eval until further work up.   Shary Decamp [facility] 09/19/2020, 10:53 AM [place] Pager [phone] Office [phone]

## 2020-09-19 NOTE — ED Notes (Addendum)
Right Arm Vitals as requested  145/91 (103, 76HR, 20RR, 100%RA

## 2020-09-19 NOTE — Evaluation (Signed)
Physical Therapy Evaluation Patient Details Name: Deborah Shields MRN: [id number] DOB: 02/16/1941 Today's Date: 09/19/2020   History of Present Illness  Pt presented 3/28 with chest pain and hypertension. Concern for possible aortic dissection.  PMH - afib, pacer, htn, pe, ckd, pvd, back surgery, shoulder surgery.  Clinical Impression  Pt presents with generalized deconditioning which daughter reports has been over the last months. Pt able to mobilize adequately to return home alone. Recommend HHPT to address deconditioning.     Follow Up Recommendations Home health PT    Equipment Recommendations  None recommended by PT    Recommendations for Other Services       Precautions / Restrictions Precautions Precautions: None      Mobility  Bed Mobility Overal bed mobility: Modified Independent             General bed mobility comments: Incr time and effort    Transfers Overall transfer level: Modified independent Equipment used: Rolling walker (2 wheeled);None             General transfer comment: Incr time/effort  Ambulation/Gait Ambulation/Gait assistance: Modified independent (Device/Increase time) Gait Distance (Feet): 150 Feet Assistive device: Rolling walker (2 wheeled);None Gait Pattern/deviations: Step-through pattern;Decreased stride length;Trunk flexed Gait velocity: decr Gait velocity interpretation: 1.31 - 2.62 ft/sec, indicative of limited community ambulator General Gait Details: Steady gait  Stairs            Wheelchair Mobility    Modified Rankin (Stroke Patients Only)       Balance Overall balance assessment: Mild deficits observed, not formally tested                                           Pertinent Vitals/Pain Pain Assessment: No/denies pain    Home Living Family/patient expects to be discharged to:: Private residence Living Arrangements: Alone Available Help at Discharge: Family Type of Home:  House Home Access: Stairs to enter Entrance Stairs-Rails: Right;Left;Can reach both Entrance Stairs-Number of Steps: 2 Home Layout: One level Home Equipment: Walker - 4 wheels;Shower seat      Prior Function Level of Independence: Needs assistance   Gait / Transfers Assistance Needed: Modified independent and uses cane or rollator if needed  ADL's / Homemaking Assistance Needed: Performs ADLs except        Hand Dominance   Dominant Hand: Right    Extremity/Trunk Assessment   Upper Extremity Assessment Upper Extremity Assessment: Generalized weakness    Lower Extremity Assessment Lower Extremity Assessment: Generalized weakness       Communication   Communication: No difficulties  Cognition Arousal/Alertness: Awake/alert Behavior During Therapy: WFL for tasks assessed/performed Overall Cognitive Status: Within Functional Limits for tasks assessed                                        General Comments      Exercises     Assessment/Plan    PT Assessment All further PT needs can be met in the next venue of care  PT Problem List Decreased strength;Decreased balance;Decreased activity tolerance       PT Treatment Interventions      PT Goals (Current goals can be found in the Care Plan section)  Acute Rehab PT Goals PT Goal Formulation: All assessment and education complete, DC therapy  Frequency     Barriers to discharge        Co-evaluation               AM-PAC PT "6 Clicks" Mobility  Outcome Measure Help needed turning from your back to your side while in a flat bed without using bedrails?: None Help needed moving from lying on your back to sitting on the side of a flat bed without using bedrails?: None Help needed moving to and from a bed to a chair (including a wheelchair)?: None Help needed standing up from a chair using your arms (e.g., wheelchair or bedside chair)?: None Help needed to walk in hospital room?:  None Help needed climbing 3-5 steps with a railing? : None 6 Click Score: 24    End of Session   Activity Tolerance: Patient tolerated treatment well Patient left: in chair;with call bell/phone within reach;with family/visitor present   PT Visit Diagnosis: Muscle weakness (generalized) (M62.81)    Time: 5631-4970 PT Time Calculation (min) (ACUTE ONLY): 21 min   Charges:   PT Evaluation $PT Eval Moderate Complexity: 1 Mod          [place] Pager [phone] Office [place] 09/19/2020, 3:47 PM

## 2020-09-19 NOTE — Progress Notes (Signed)
Progress Note  Patient Name: Deborah Shields Date of Encounter: 09/19/2020  [facility] Cardiologist: Sanda Klein, MD   Subjective   Feeling much better.  No more chest pain after her blood pressures came down.  Has been resting comfortably all night long.  Breathing fine.  Was actually hungry and ate a good meal.  Just little tired because she did not sleep that well in the ER room.  Inpatient Medications    Scheduled Meds: . amLODipine  10 mg Oral Daily  . apixaban  2.5 mg Oral BID  . aspirin  81 mg Oral Once  . flecainide  50 mg Oral Q12H  . levothyroxine  75 mcg Oral QAC breakfast  . LORazepam  0.5 mg Oral QHS  . metoprolol succinate  50 mg Oral Daily  . senna  1 tablet Oral QODAY   Continuous Infusions: . sodium chloride 75 mL/hr at 09/19/20 0833   PRN Meds: acetaminophen, ipratropium-albuterol, labetalol, meclizine, nitroGLYCERIN, polyethylene glycol, traMADol   Vital Signs    Vitals:   09/19/20 1245 09/19/20 1300 09/19/20 1330 09/19/20 1345  BP: 123/68 115/73 119/67 98/73  Pulse: 63 63 63 65  Resp: 11 11 15 15   Temp:      TempSrc:      SpO2: 99% 100% 96% 98%  Weight:      Height:        Intake/Output Summary (Last 24 hours) at 09/19/2020 1439 Last data filed at 09/18/2020 1649 Gross per 24 hour  Intake --  Output 600 ml  Net -600 ml   Last 3 Weights 09/18/2020 12/06/2019 10/01/2019  Weight (lbs) 105 lb 109 lb 107 lb 9.6 oz  Weight (kg) 47.628 kg 49.442 kg 48.807 kg      Telemetry    Sinus rhythm rate in the 60s.- Personally Reviewed  ECG    No new EKG- Personally Reviewed  Physical Exam   GEN:  Resting comfortably in bed, no acute distress.   Neck: No JVD cannot exclude soft bilateral carotid bruits, versus radiated aortic murmur. Cardiac: RRR, 2-3/6 SEM at RUSB.  No rubs, or gallops.  Respiratory: Clear to auscultation bilaterally.  Nonlabored, good air movement. GI: Soft, nontender, non-distended  MS: No edema; No  deformity. Neuro:  Nonfocal  Psych: Normal affect   Labs    High Sensitivity Troponin:   Recent Labs  Lab 09/18/20 1001 09/18/20 1201  TROPONINIHS 16 16      Chemistry Recent Labs  Lab 09/18/20 1001 09/19/20 0856  NA 138 137  K 4.0 4.3  CL 105 104  CO2 25 24  GLUCOSE 100* 100*  BUN 24* 19  CREATININE 1.86* 1.48*  CALCIUM 8.7* 8.8*  PROT 6.2*  --   ALBUMIN 2.8*  --   AST 28  --   ALT 13  --   ALKPHOS 74  --   BILITOT 0.9  --   GFRNONAA 27* 35*  ANIONGAP 8 9     Hematology Recent Labs  Lab 09/18/20 1001 09/19/20 0717  WBC 9.5 7.4  RBC 3.47* 3.99  HGB 10.6* 11.9*  HCT 31.7* 36.6  MCV 91.4 91.7  MCH 30.5 29.8  MCHC 33.4 32.5  RDW 13.3 13.4  PLT 192 229    BNPNo results for input(s): BNP, PROBNP in the last 168 hours.   DDimer No results for input(s): DDIMER in the last 168 hours.   Radiology    CT Head Wo Contrast  Result Date: 09/18/2020 CLINICAL DATA:  Dizziness,  nonspecific. Additional history provided: Dizziness, on blood thinners. EXAM: CT HEAD WITHOUT CONTRAST TECHNIQUE: Contiguous axial images were obtained from the base of the skull through the vertex without intravenous contrast. COMPARISON:  Brain MRI 06/29/2004. FINDINGS: Brain: Mild cerebral atrophy. Moderate patchy and ill-defined hypoattenuation within the cerebral white matter is nonspecific, but compatible with chronic small vessel ischemic disease. There is no acute intracranial hemorrhage. No demarcated cortical infarct. No extra-axial fluid collection. No evidence of intracranial mass. No midline shift. Vascular: No hyperdense vessel.  Atherosclerotic calcifications. Skull: Normal. Negative for fracture or focal lesion. Sinuses/Orbits: Visualized orbits show no acute finding. Tiny right sphenoid sinus mucous retention cyst. IMPRESSION: No evidence of acute intracranial abnormality. Moderate cerebral white matter chronic small vessel ischemic disease. Mild generalized cerebral atrophy.  Incidentally noted small right sphenoid sinus mucous retention cyst. Electronically Signed   By: Kellie Simmering DO   On: 09/18/2020 13:25   DG Chest Portable 1 View  Result Date: 09/18/2020 CLINICAL DATA:  Chest pain and dizziness since last night. EXAM: PORTABLE CHEST 1 VIEW COMPARISON:  Radiographs 10/01/2019.  CT 08/28/2019. FINDINGS: 1010 hours. Left subclavian pacemaker leads appear unchanged at the level of the right atrium and right ventricle. The heart size and mediastinal contours are stable with aortic atherosclerosis. Mild scarring is present at both lung apices. The lungs are otherwise clear. There is no pleural effusion or pneumothorax. Bilateral skin folds are noted. Evidence of a chronic rotator cuff tear on the left. No acute osseous findings. IMPRESSION: Stable chest. No active cardiopulmonary process. Electronically Signed   By: Richardean Sale M.D.   On: 09/18/2020 10:25    Cardiac Studies   Echocardiogram being done at the bedside when I came to see her.  EF looks well-preserved.  She does have LVH with aortic sclerosis and mild AI.  No wall motion abnormality.  No aortic stenosis.  Suspect diastolic dysfunction.  Specific looks at the abdominal and thoracic aorta is did not see any evidence of dissection.  Not able to get a good view of the arch, but did not see obvious dissection.  Patient Profile     80 y.o. female with history of symptomatic PAF, SSS s/p PPM, bilateral carotid artery disease secondary to FMD, HTN/HLD, prior PE, and CKD stage IIIa along with hypothyroidism who was seen in emergency room yesterday at the request of Dr. Tawanna Solo for chest pain and accelerated hypertension.  Last documented A. fib episode was in March 2021 with A. fib RVR in the setting of upper GI bleed/hemoptysis.  Underwent PPM placement on September 06, 2019 because of long post terminal pauses.  Assessment & Plan    Principal Problem:   Atypical chest pain Active Problems:   Hypertension,  accelerated with heart disease, without CHF   Hyperlipidemia with target LDL less than 100   Essential hypertension   PAF (paroxysmal atrial fibrillation) (HCC)   Hypothyroidism (acquired)   COPD with chronic bronchitis and emphysema ([place])  Feeling much better today.  I suspect that her chest pain is related to hypertension.  The fact that she is no longer having symptoms as blood pressures resolved, would argue against aortic dissection.  She looks clinically way to stable for that.  She is on beta-blocker and amlodipine in the outpatient setting, we will add low-dose ARB along with PRN hydralazine 25 mg for SBP> 160 mmHg. Somewhat atypical symptoms, doubt that this is true ACS, but not unreasonable to evaluate for ischemia.  We will plan for outpatient  Myoview stress test.  She has been stable in sinus rhythm with no PAF.  She is on apixaban low-dose for anticoagulation.  On beta-blocker for rate control, along with flecainide for rhythm control. This patients CHA2DS2-VASc Score and unadjusted Ischemic Stroke Rate (% per year) is equal to 7.2 % stroke rate/year from a score of 5  Above score calculated as 1 point each if present [CHF, HTN, DM, Vascular=MI/PAD/Aortic Plaque, Age if 65-74, or Female] Above score calculated as 2 points each if present [Age > 75, or Stroke/TIA/TE]   Okay to discharge from cardiac standpoint.  We will arrange outpatient stress test.   [facility] will sign off.   Medication Recommendations: Continue home medications, add:  losartan 25 mg p.o. daily.  Hydralazine 25 mg p.o. every 8 hours for SBP greater than 160 mm.Hg  Other recommendations (labs, testing, etc): We will arrange for outpatient Myoview stress test to be done Follow up as an outpatient: We will schedule follow-up with Dr. Sallyanne Kuster or APP  For questions or updates, please contact [facility] Please consult [email or web address] for contact info under        Signed, Glenetta Hew, MD   09/19/2020, 2:39 PM

## 2020-09-19 NOTE — ED Notes (Addendum)
Left Arm Vitals as requested  153/85 (106), 66HR, 20RR, 100% RA

## 2020-09-21 ENCOUNTER — Telehealth (HOSPITAL_COMMUNITY): Payer: Self-pay | Admitting: *Deleted

## 2020-09-21 DIAGNOSIS — I480 Paroxysmal atrial fibrillation: Secondary | ICD-10-CM | POA: Diagnosis not present

## 2020-09-21 DIAGNOSIS — Z90722 Acquired absence of ovaries, bilateral: Secondary | ICD-10-CM | POA: Diagnosis not present

## 2020-09-21 DIAGNOSIS — Z90710 Acquired absence of both cervix and uterus: Secondary | ICD-10-CM | POA: Diagnosis not present

## 2020-09-21 DIAGNOSIS — M1990 Unspecified osteoarthritis, unspecified site: Secondary | ICD-10-CM | POA: Diagnosis not present

## 2020-09-21 DIAGNOSIS — R011 Cardiac murmur, unspecified: Secondary | ICD-10-CM | POA: Diagnosis not present

## 2020-09-21 DIAGNOSIS — I351 Nonrheumatic aortic (valve) insufficiency: Secondary | ICD-10-CM | POA: Diagnosis not present

## 2020-09-21 DIAGNOSIS — G629 Polyneuropathy, unspecified: Secondary | ICD-10-CM | POA: Diagnosis not present

## 2020-09-21 DIAGNOSIS — E785 Hyperlipidemia, unspecified: Secondary | ICD-10-CM | POA: Diagnosis not present

## 2020-09-21 DIAGNOSIS — I1310 Hypertensive heart and chronic kidney disease without heart failure, with stage 1 through stage 4 chronic kidney disease, or unspecified chronic kidney disease: Secondary | ICD-10-CM | POA: Diagnosis not present

## 2020-09-21 DIAGNOSIS — Z79891 Long term (current) use of opiate analgesic: Secondary | ICD-10-CM | POA: Diagnosis not present

## 2020-09-21 DIAGNOSIS — I773 Arterial fibromuscular dysplasia: Secondary | ICD-10-CM | POA: Diagnosis not present

## 2020-09-21 DIAGNOSIS — D631 Anemia in chronic kidney disease: Secondary | ICD-10-CM | POA: Diagnosis not present

## 2020-09-21 DIAGNOSIS — Z9181 History of falling: Secondary | ICD-10-CM | POA: Diagnosis not present

## 2020-09-21 DIAGNOSIS — I739 Peripheral vascular disease, unspecified: Secondary | ICD-10-CM | POA: Diagnosis not present

## 2020-09-21 DIAGNOSIS — I495 Sick sinus syndrome: Secondary | ICD-10-CM | POA: Diagnosis not present

## 2020-09-21 DIAGNOSIS — K5790 Diverticulosis of intestine, part unspecified, without perforation or abscess without bleeding: Secondary | ICD-10-CM | POA: Diagnosis not present

## 2020-09-21 DIAGNOSIS — I6523 Occlusion and stenosis of bilateral carotid arteries: Secondary | ICD-10-CM | POA: Diagnosis not present

## 2020-09-21 DIAGNOSIS — Z950 Presence of cardiac pacemaker: Secondary | ICD-10-CM | POA: Diagnosis not present

## 2020-09-21 DIAGNOSIS — E039 Hypothyroidism, unspecified: Secondary | ICD-10-CM | POA: Diagnosis not present

## 2020-09-21 DIAGNOSIS — F419 Anxiety disorder, unspecified: Secondary | ICD-10-CM | POA: Diagnosis not present

## 2020-09-21 DIAGNOSIS — Z9089 Acquired absence of other organs: Secondary | ICD-10-CM | POA: Diagnosis not present

## 2020-09-21 DIAGNOSIS — Z7901 Long term (current) use of anticoagulants: Secondary | ICD-10-CM | POA: Diagnosis not present

## 2020-09-21 DIAGNOSIS — N1832 Chronic kidney disease, stage 3b: Secondary | ICD-10-CM | POA: Diagnosis not present

## 2020-09-21 DIAGNOSIS — Z9049 Acquired absence of other specified parts of digestive tract: Secondary | ICD-10-CM | POA: Diagnosis not present

## 2020-09-21 DIAGNOSIS — J439 Emphysema, unspecified: Secondary | ICD-10-CM | POA: Diagnosis not present

## 2020-09-21 NOTE — Telephone Encounter (Signed)
Close encounter 

## 2020-09-26 ENCOUNTER — Inpatient Hospital Stay (HOSPITAL_COMMUNITY): Admit: 2020-09-26 | Payer: Medicare Other

## 2020-09-27 DIAGNOSIS — D631 Anemia in chronic kidney disease: Secondary | ICD-10-CM | POA: Diagnosis not present

## 2020-09-27 DIAGNOSIS — I495 Sick sinus syndrome: Secondary | ICD-10-CM | POA: Diagnosis not present

## 2020-09-27 DIAGNOSIS — N1832 Chronic kidney disease, stage 3b: Secondary | ICD-10-CM | POA: Diagnosis not present

## 2020-09-27 DIAGNOSIS — I480 Paroxysmal atrial fibrillation: Secondary | ICD-10-CM | POA: Diagnosis not present

## 2020-09-27 DIAGNOSIS — I351 Nonrheumatic aortic (valve) insufficiency: Secondary | ICD-10-CM | POA: Diagnosis not present

## 2020-09-27 DIAGNOSIS — I1310 Hypertensive heart and chronic kidney disease without heart failure, with stage 1 through stage 4 chronic kidney disease, or unspecified chronic kidney disease: Secondary | ICD-10-CM | POA: Diagnosis not present

## 2020-09-29 DIAGNOSIS — I495 Sick sinus syndrome: Secondary | ICD-10-CM | POA: Diagnosis not present

## 2020-09-29 DIAGNOSIS — N1832 Chronic kidney disease, stage 3b: Secondary | ICD-10-CM | POA: Diagnosis not present

## 2020-09-29 DIAGNOSIS — D631 Anemia in chronic kidney disease: Secondary | ICD-10-CM | POA: Diagnosis not present

## 2020-09-29 DIAGNOSIS — I351 Nonrheumatic aortic (valve) insufficiency: Secondary | ICD-10-CM | POA: Diagnosis not present

## 2020-09-29 DIAGNOSIS — I1310 Hypertensive heart and chronic kidney disease without heart failure, with stage 1 through stage 4 chronic kidney disease, or unspecified chronic kidney disease: Secondary | ICD-10-CM | POA: Diagnosis not present

## 2020-09-29 DIAGNOSIS — I480 Paroxysmal atrial fibrillation: Secondary | ICD-10-CM | POA: Diagnosis not present

## 2020-10-02 ENCOUNTER — Ambulatory Visit: Payer: Medicare Other | Admitting: Student

## 2020-10-02 DIAGNOSIS — D631 Anemia in chronic kidney disease: Secondary | ICD-10-CM | POA: Diagnosis not present

## 2020-10-02 DIAGNOSIS — I495 Sick sinus syndrome: Secondary | ICD-10-CM | POA: Diagnosis not present

## 2020-10-02 DIAGNOSIS — I351 Nonrheumatic aortic (valve) insufficiency: Secondary | ICD-10-CM | POA: Diagnosis not present

## 2020-10-02 DIAGNOSIS — I480 Paroxysmal atrial fibrillation: Secondary | ICD-10-CM | POA: Diagnosis not present

## 2020-10-02 DIAGNOSIS — I1310 Hypertensive heart and chronic kidney disease without heart failure, with stage 1 through stage 4 chronic kidney disease, or unspecified chronic kidney disease: Secondary | ICD-10-CM | POA: Diagnosis not present

## 2020-10-02 DIAGNOSIS — N1832 Chronic kidney disease, stage 3b: Secondary | ICD-10-CM | POA: Diagnosis not present

## 2020-10-04 ENCOUNTER — Ambulatory Visit: Payer: Medicare Other | Admitting: General Practice

## 2020-10-04 DIAGNOSIS — I495 Sick sinus syndrome: Secondary | ICD-10-CM | POA: Diagnosis not present

## 2020-10-04 DIAGNOSIS — I351 Nonrheumatic aortic (valve) insufficiency: Secondary | ICD-10-CM | POA: Diagnosis not present

## 2020-10-04 DIAGNOSIS — I1310 Hypertensive heart and chronic kidney disease without heart failure, with stage 1 through stage 4 chronic kidney disease, or unspecified chronic kidney disease: Secondary | ICD-10-CM | POA: Diagnosis not present

## 2020-10-04 DIAGNOSIS — N1832 Chronic kidney disease, stage 3b: Secondary | ICD-10-CM | POA: Diagnosis not present

## 2020-10-04 DIAGNOSIS — D631 Anemia in chronic kidney disease: Secondary | ICD-10-CM | POA: Diagnosis not present

## 2020-10-04 DIAGNOSIS — I480 Paroxysmal atrial fibrillation: Secondary | ICD-10-CM | POA: Diagnosis not present

## 2020-10-09 DIAGNOSIS — I480 Paroxysmal atrial fibrillation: Secondary | ICD-10-CM | POA: Diagnosis not present

## 2020-10-09 DIAGNOSIS — I1310 Hypertensive heart and chronic kidney disease without heart failure, with stage 1 through stage 4 chronic kidney disease, or unspecified chronic kidney disease: Secondary | ICD-10-CM | POA: Diagnosis not present

## 2020-10-09 DIAGNOSIS — I351 Nonrheumatic aortic (valve) insufficiency: Secondary | ICD-10-CM | POA: Diagnosis not present

## 2020-10-09 DIAGNOSIS — I495 Sick sinus syndrome: Secondary | ICD-10-CM | POA: Diagnosis not present

## 2020-10-09 DIAGNOSIS — N1832 Chronic kidney disease, stage 3b: Secondary | ICD-10-CM | POA: Diagnosis not present

## 2020-10-09 DIAGNOSIS — D631 Anemia in chronic kidney disease: Secondary | ICD-10-CM | POA: Diagnosis not present

## 2020-10-17 DIAGNOSIS — J449 Chronic obstructive pulmonary disease, unspecified: Secondary | ICD-10-CM | POA: Diagnosis not present

## 2020-10-17 DIAGNOSIS — I480 Paroxysmal atrial fibrillation: Secondary | ICD-10-CM | POA: Diagnosis not present

## 2020-10-17 DIAGNOSIS — E039 Hypothyroidism, unspecified: Secondary | ICD-10-CM | POA: Diagnosis not present

## 2020-10-17 DIAGNOSIS — E7800 Pure hypercholesterolemia, unspecified: Secondary | ICD-10-CM | POA: Diagnosis not present

## 2020-10-17 DIAGNOSIS — N1832 Chronic kidney disease, stage 3b: Secondary | ICD-10-CM | POA: Diagnosis not present

## 2020-10-17 DIAGNOSIS — I129 Hypertensive chronic kidney disease with stage 1 through stage 4 chronic kidney disease, or unspecified chronic kidney disease: Secondary | ICD-10-CM | POA: Diagnosis not present

## 2020-10-17 DIAGNOSIS — N184 Chronic kidney disease, stage 4 (severe): Secondary | ICD-10-CM | POA: Diagnosis not present

## 2020-10-20 DIAGNOSIS — I1310 Hypertensive heart and chronic kidney disease without heart failure, with stage 1 through stage 4 chronic kidney disease, or unspecified chronic kidney disease: Secondary | ICD-10-CM | POA: Diagnosis not present

## 2020-10-20 DIAGNOSIS — N1832 Chronic kidney disease, stage 3b: Secondary | ICD-10-CM | POA: Diagnosis not present

## 2020-10-20 DIAGNOSIS — I480 Paroxysmal atrial fibrillation: Secondary | ICD-10-CM | POA: Diagnosis not present

## 2020-10-20 DIAGNOSIS — D631 Anemia in chronic kidney disease: Secondary | ICD-10-CM | POA: Diagnosis not present

## 2020-10-20 DIAGNOSIS — I495 Sick sinus syndrome: Secondary | ICD-10-CM | POA: Diagnosis not present

## 2020-10-20 DIAGNOSIS — I351 Nonrheumatic aortic (valve) insufficiency: Secondary | ICD-10-CM | POA: Diagnosis not present

## 2020-11-17 DIAGNOSIS — N1832 Chronic kidney disease, stage 3b: Secondary | ICD-10-CM | POA: Diagnosis not present

## 2020-11-17 DIAGNOSIS — I480 Paroxysmal atrial fibrillation: Secondary | ICD-10-CM | POA: Diagnosis not present

## 2020-11-17 DIAGNOSIS — E7800 Pure hypercholesterolemia, unspecified: Secondary | ICD-10-CM | POA: Diagnosis not present

## 2020-11-17 DIAGNOSIS — J449 Chronic obstructive pulmonary disease, unspecified: Secondary | ICD-10-CM | POA: Diagnosis not present

## 2020-11-17 DIAGNOSIS — I129 Hypertensive chronic kidney disease with stage 1 through stage 4 chronic kidney disease, or unspecified chronic kidney disease: Secondary | ICD-10-CM | POA: Diagnosis not present

## 2020-11-17 DIAGNOSIS — E039 Hypothyroidism, unspecified: Secondary | ICD-10-CM | POA: Diagnosis not present

## 2020-12-04 ENCOUNTER — Ambulatory Visit (INDEPENDENT_AMBULATORY_CARE_PROVIDER_SITE_OTHER): Payer: Medicare Other

## 2020-12-04 DIAGNOSIS — I495 Sick sinus syndrome: Secondary | ICD-10-CM | POA: Diagnosis not present

## 2020-12-06 LAB — CUP PACEART REMOTE DEVICE CHECK
Battery Remaining Longevity: 115 mo
Battery Remaining Percentage: 95.5 %
Battery Voltage: 3.02 V
Brady Statistic AP VP Percent: 1 %
Brady Statistic AP VS Percent: 48 %
Brady Statistic AS VP Percent: 1 %
Brady Statistic AS VS Percent: 51 %
Brady Statistic RA Percent Paced: 48 %
Brady Statistic RV Percent Paced: 1 %
Date Time Interrogation Session: 20220613020013
Implantable Lead Implant Date: 20210315
Implantable Lead Implant Date: 20210315
Implantable Lead Location: 753859
Implantable Lead Location: 753860
Implantable Pulse Generator Implant Date: 20210315
Lead Channel Impedance Value: 410 Ohm
Lead Channel Impedance Value: 590 Ohm
Lead Channel Pacing Threshold Amplitude: 0.75 V
Lead Channel Pacing Threshold Amplitude: 1 V
Lead Channel Pacing Threshold Pulse Width: 0.5 ms
Lead Channel Pacing Threshold Pulse Width: 0.5 ms
Lead Channel Sensing Intrinsic Amplitude: 2.6 mV
Lead Channel Sensing Intrinsic Amplitude: 4.8 mV
Lead Channel Setting Pacing Amplitude: 2.5 V
Lead Channel Setting Pacing Amplitude: 2.5 V
Lead Channel Setting Pacing Pulse Width: 0.5 ms
Lead Channel Setting Sensing Sensitivity: 2 mV
Pulse Gen Model: 2272
Pulse Gen Serial Number: 3805113

## 2020-12-19 DIAGNOSIS — L814 Other melanin hyperpigmentation: Secondary | ICD-10-CM | POA: Diagnosis not present

## 2020-12-19 DIAGNOSIS — D692 Other nonthrombocytopenic purpura: Secondary | ICD-10-CM | POA: Diagnosis not present

## 2020-12-19 DIAGNOSIS — Z85828 Personal history of other malignant neoplasm of skin: Secondary | ICD-10-CM | POA: Diagnosis not present

## 2020-12-19 DIAGNOSIS — C44729 Squamous cell carcinoma of skin of left lower limb, including hip: Secondary | ICD-10-CM | POA: Diagnosis not present

## 2020-12-22 ENCOUNTER — Other Ambulatory Visit: Payer: Self-pay | Admitting: Cardiovascular Disease

## 2020-12-26 NOTE — Progress Notes (Signed)
Remote pacemaker transmission.   

## 2021-01-08 ENCOUNTER — Telehealth: Payer: Self-pay | Admitting: Cardiovascular Disease

## 2021-01-08 ENCOUNTER — Other Ambulatory Visit: Payer: Self-pay

## 2021-01-08 NOTE — Telephone Encounter (Signed)
*  STAT* If patient is at the pharmacy, call can be transferred to refill team.   1. Which medications need to be refilled? (please list name of each medication and dose if known)  apixaban (ELIQUIS) 2.5 MG TABS tablet flecainide (TAMBOCOR) 50 MG tablet losartan (COZAAR) 25 MG tablet metoprolol succinate (TOPROL-XL) 50 MG 24 hr tablet  2. Which pharmacy/location (including street and city if local pharmacy) is medication to be sent to? [facility] - [place], [place] - [place] [address]  3. Do they need a 30 day or 90 day supply? 90 with refills    Patient will be transitioning to [facility] and will be getting pill packs

## 2021-01-09 ENCOUNTER — Other Ambulatory Visit: Payer: Self-pay

## 2021-01-09 MED ORDER — LOSARTAN POTASSIUM 25 MG PO TABS
25.0000 mg | ORAL_TABLET | Freq: Every day | ORAL | 1 refills | Status: DC
Start: 2021-01-09 — End: 2021-01-10

## 2021-01-09 MED ORDER — METOPROLOL SUCCINATE ER 50 MG PO TB24: ORAL_TABLET | ORAL | 9 refills | Status: DC

## 2021-01-09 MED ORDER — FLECAINIDE ACETATE 50 MG PO TABS: ORAL_TABLET | ORAL | 9 refills | Status: DC

## 2021-01-09 MED ORDER — APIXABAN 2.5 MG PO TABS: 2.5000 mg | ORAL_TABLET | Freq: Two times a day (BID) | ORAL | 1 refills | Status: DC

## 2021-01-09 NOTE — Telephone Encounter (Signed)
    Pt's pcp calling to f/u refill request, pt is out of meds, she said needs refill sent today before 12pm so meds will be sent today to the pt.

## 2021-01-09 NOTE — Addendum Note (Signed)
Addended by: Allean Found on: 01/09/2021 05:06 PM   Modules accepted: Orders

## 2021-01-09 NOTE — Telephone Encounter (Signed)
Refill for eliquis granted36f, 49.4kg, scr 1.48 09/19/20, lovw/croitoru 12/06/19

## 2021-01-10 ENCOUNTER — Telehealth: Payer: Self-pay | Admitting: Cardiovascular Disease

## 2021-01-10 ENCOUNTER — Other Ambulatory Visit: Payer: Self-pay

## 2021-01-10 DIAGNOSIS — I480 Paroxysmal atrial fibrillation: Secondary | ICD-10-CM | POA: Diagnosis not present

## 2021-01-10 DIAGNOSIS — E039 Hypothyroidism, unspecified: Secondary | ICD-10-CM | POA: Diagnosis not present

## 2021-01-10 DIAGNOSIS — J449 Chronic obstructive pulmonary disease, unspecified: Secondary | ICD-10-CM | POA: Diagnosis not present

## 2021-01-10 DIAGNOSIS — E7800 Pure hypercholesterolemia, unspecified: Secondary | ICD-10-CM | POA: Diagnosis not present

## 2021-01-10 DIAGNOSIS — I129 Hypertensive chronic kidney disease with stage 1 through stage 4 chronic kidney disease, or unspecified chronic kidney disease: Secondary | ICD-10-CM | POA: Diagnosis not present

## 2021-01-10 DIAGNOSIS — N184 Chronic kidney disease, stage 4 (severe): Secondary | ICD-10-CM | POA: Diagnosis not present

## 2021-01-10 MED ORDER — LOSARTAN POTASSIUM 25 MG PO TABS: 25.0000 mg | ORAL_TABLET | Freq: Every day | ORAL | 1 refills | Status: DC

## 2021-01-10 NOTE — Telephone Encounter (Signed)
RX sent to pharmacy  

## 2021-01-10 NOTE — Telephone Encounter (Signed)
*  STAT* If patient is at the pharmacy, call can be transferred to refill team.   1. Which medications need to be refilled? (please list name of each medication and dose if known) new prescription for her Losartan- changing pharmacy  2. Which pharmacy/location (including street and city if local pharmacy) is medication to be sent to? [facility]  3. Do they need a 30 day or 90 day supply? 90 days and refills

## 2021-01-22 ENCOUNTER — Telehealth: Payer: Self-pay

## 2021-01-22 DIAGNOSIS — I480 Paroxysmal atrial fibrillation: Secondary | ICD-10-CM

## 2021-01-22 DIAGNOSIS — I495 Sick sinus syndrome: Secondary | ICD-10-CM

## 2021-01-22 NOTE — Telephone Encounter (Signed)
Merlin alert for HVR. EGM's show AF with RVR with periods of 1:1 conduction.  87 AMS episodes, burden <1%.  Duration 16sec - 51min 30sec on 7/31. Pt. prescribed Eliquis, Flecainide, Metoprolol. Route to triage for 7/31 episode.   Attempted to reach pt to assess symptoms and med compliance.  No answer, LVM with device clinic # and hours to return call.   Episode occurred 01/21/21 0234.  AF with /RVR.  Pt is on [place].

## 2021-01-23 ENCOUNTER — Other Ambulatory Visit: Payer: Self-pay

## 2021-01-23 ENCOUNTER — Telehealth: Payer: Self-pay

## 2021-01-23 ENCOUNTER — Encounter (HOSPITAL_COMMUNITY): Payer: Self-pay | Admitting: Emergency Medicine

## 2021-01-23 ENCOUNTER — Observation Stay (HOSPITAL_COMMUNITY)
Admission: EM | Admit: 2021-01-23 | Discharge: 2021-01-24 | Disposition: A | Discharge status: Home / Self Care | Payer: Medicare Other | Attending: Emergency Medicine | Admitting: Emergency Medicine

## 2021-01-23 ENCOUNTER — Emergency Department (HOSPITAL_COMMUNITY): Payer: Medicare Other

## 2021-01-23 DIAGNOSIS — Z950 Presence of cardiac pacemaker: Secondary | ICD-10-CM | POA: Diagnosis present

## 2021-01-23 DIAGNOSIS — J439 Emphysema, unspecified: Secondary | ICD-10-CM | POA: Diagnosis present

## 2021-01-23 DIAGNOSIS — Z7901 Long term (current) use of anticoagulants: Secondary | ICD-10-CM | POA: Insufficient documentation

## 2021-01-23 DIAGNOSIS — I10 Essential (primary) hypertension: Secondary | ICD-10-CM | POA: Diagnosis present

## 2021-01-23 DIAGNOSIS — I1310 Hypertensive heart and chronic kidney disease without heart failure, with stage 1 through stage 4 chronic kidney disease, or unspecified chronic kidney disease: Secondary | ICD-10-CM | POA: Insufficient documentation

## 2021-01-23 DIAGNOSIS — J449 Chronic obstructive pulmonary disease, unspecified: Secondary | ICD-10-CM | POA: Diagnosis not present

## 2021-01-23 DIAGNOSIS — E039 Hypothyroidism, unspecified: Secondary | ICD-10-CM | POA: Diagnosis not present

## 2021-01-23 DIAGNOSIS — Z79899 Other long term (current) drug therapy: Secondary | ICD-10-CM | POA: Insufficient documentation

## 2021-01-23 DIAGNOSIS — Z85828 Personal history of other malignant neoplasm of skin: Secondary | ICD-10-CM | POA: Diagnosis not present

## 2021-01-23 DIAGNOSIS — R42 Dizziness and giddiness: Secondary | ICD-10-CM | POA: Diagnosis not present

## 2021-01-23 DIAGNOSIS — I4891 Unspecified atrial fibrillation: Secondary | ICD-10-CM | POA: Diagnosis not present

## 2021-01-23 DIAGNOSIS — I499 Cardiac arrhythmia, unspecified: Secondary | ICD-10-CM | POA: Diagnosis not present

## 2021-01-23 DIAGNOSIS — R531 Weakness: Secondary | ICD-10-CM | POA: Diagnosis not present

## 2021-01-23 DIAGNOSIS — I480 Paroxysmal atrial fibrillation: Secondary | ICD-10-CM | POA: Diagnosis not present

## 2021-01-23 DIAGNOSIS — N183 Chronic kidney disease, stage 3 unspecified: Secondary | ICD-10-CM | POA: Insufficient documentation

## 2021-01-23 DIAGNOSIS — N179 Acute kidney failure, unspecified: Secondary | ICD-10-CM | POA: Diagnosis not present

## 2021-01-23 DIAGNOSIS — Z20822 Contact with and (suspected) exposure to covid-19: Secondary | ICD-10-CM | POA: Diagnosis not present

## 2021-01-23 DIAGNOSIS — I491 Atrial premature depolarization: Secondary | ICD-10-CM | POA: Diagnosis not present

## 2021-01-23 DIAGNOSIS — R0902 Hypoxemia: Secondary | ICD-10-CM | POA: Diagnosis not present

## 2021-01-23 LAB — CBC WITH DIFFERENTIAL/PLATELET
Abs Immature Granulocytes: 0.06 10*3/uL (ref 0.00–0.07)
Basophils Absolute: 0.1 10*3/uL (ref 0.0–0.1)
Basophils Relative: 1 %
Eosinophils Absolute: 0.1 10*3/uL (ref 0.0–0.5)
Eosinophils Relative: 1 %
HCT: 37.6 % (ref 36.0–46.0)
Hemoglobin: 12.4 g/dL (ref 12.0–15.0)
Immature Granulocytes: 1 %
Lymphocytes Relative: 12 %
Lymphs Abs: 1.2 10*3/uL (ref 0.7–4.0)
MCH: 29.7 pg (ref 26.0–34.0)
MCHC: 33 g/dL (ref 30.0–36.0)
MCV: 90.2 fL (ref 80.0–100.0)
Monocytes Absolute: 1 10*3/uL (ref 0.1–1.0)
Monocytes Relative: 10 %
Neutro Abs: 7.9 10*3/uL — ABNORMAL HIGH (ref 1.7–7.7)
Neutrophils Relative %: 75 %
Platelets: 253 10*3/uL (ref 150–400)
RBC: 4.17 MIL/uL (ref 3.87–5.11)
RDW: 14.6 % (ref 11.5–15.5)
WBC: 10.3 10*3/uL (ref 4.0–10.5)
nRBC: 0 % (ref 0.0–0.2)

## 2021-01-23 LAB — COMPREHENSIVE METABOLIC PANEL
ALT: 16 U/L (ref 0–44)
AST: 17 U/L (ref 15–41)
Albumin: 2.5 g/dL — ABNORMAL LOW (ref 3.5–5.0)
Alkaline Phosphatase: 91 U/L (ref 38–126)
Anion gap: 10 (ref 5–15)
BUN: 41 mg/dL — ABNORMAL HIGH (ref 8–23)
CO2: 20 mmol/L — ABNORMAL LOW (ref 22–32)
Calcium: 8.7 mg/dL — ABNORMAL LOW (ref 8.9–10.3)
Chloride: 106 mmol/L (ref 98–111)
Creatinine, Ser: 2.22 mg/dL — ABNORMAL HIGH (ref 0.44–1.00)
GFR, Estimated: 22 mL/min — ABNORMAL LOW (ref >60)
Glucose, Bld: 95 mg/dL (ref 70–99)
Potassium: 3.5 mmol/L (ref 3.5–5.1)
Sodium: 136 mmol/L (ref 135–145)
Total Bilirubin: 1.1 mg/dL (ref 0.3–1.2)
Total Protein: 6.3 g/dL — ABNORMAL LOW (ref 6.5–8.1)

## 2021-01-23 LAB — TROPONIN I (HIGH SENSITIVITY): Troponin I (High Sensitivity): 42 ng/L — ABNORMAL HIGH (ref <18)

## 2021-01-23 LAB — RESP PANEL BY RT-PCR (FLU A&B, COVID) ARPGX2
Influenza A by PCR: NEGATIVE
Influenza B by PCR: NEGATIVE
SARS Coronavirus 2 by RT PCR: NEGATIVE

## 2021-01-23 MED ORDER — SODIUM CHLORIDE 0.9 % IV BOLUS
1000.0000 mL | Freq: Once | INTRAVENOUS | Status: AC
  Administered 2021-01-23: 1000 mL via INTRAVENOUS

## 2021-01-23 MED ORDER — FLECAINIDE ACETATE 50 MG PO TABS: 75.0000 mg | ORAL_TABLET | Freq: Two times a day (BID) | ORAL | 1 refills | Status: DC

## 2021-01-23 MED ORDER — POTASSIUM CHLORIDE CRYS ER 20 MEQ PO TBCR
20.0000 meq | EXTENDED_RELEASE_TABLET | ORAL | Status: DC
  Administered 2021-01-23 – 2021-01-24 (×2): 20 meq via ORAL
  Filled 2021-01-23 (×2): qty 1

## 2021-01-23 MED ORDER — METOPROLOL SUCCINATE ER 100 MG PO TB24: 100.0000 mg | ORAL_TABLET | Freq: Every day | ORAL | 3 refills | Status: DC

## 2021-01-23 MED ORDER — METOPROLOL SUCCINATE ER 100 MG PO TB24: 100.0000 mg | ORAL_TABLET | Freq: Every day | ORAL | 2 refills | Status: AC

## 2021-01-23 NOTE — Assessment & Plan Note (Signed)
Admit to telemetry bed. Observation. Gentle IVF overnight. Repeat CMP in AM.

## 2021-01-23 NOTE — Telephone Encounter (Signed)
Attempted to contact patient. No answer,LMTCB 

## 2021-01-23 NOTE — Assessment & Plan Note (Signed)
Stable

## 2021-01-23 NOTE — ED Provider Notes (Signed)
[facility] EMERGENCY DEPARTMENT Provider Note   CSN: [id number] Arrival date & time: 01/23/21  1355     History Chief Complaint  Patient presents with   Dizziness   Weakness    Deborah Shields is a 80 y.o. female with a past medical history of hypertension presenting to the ED with a chief complaint of dizziness, generalized weakness and decreased p.o. intake.  Symptoms have been going on for about 1 week.  Has noticed for the past few days that her heart is going "in and out of rhythm and racing."  No specific trigger for this.  She has been compliant with her medications including her anticoagulation.  Denies any injuries or falls.  She resides at home by herself.  She denies any abdominal pain, chest pain, vomiting or diarrhea but just is more tired and fatigued than usual.  Denies any headache, vision changes, recent medication adjustments.  HPI     Past Medical History:  Diagnosis Date   Anxiety    Aortic insufficiency    mild due to degenerative changes   Arthritis    Basal cell carcinoma 2007   [facility] Left leg   Carotid artery occlusion    Chronic kidney disease    CRD Stage 3   Conjunctivitis due to adenovirus, both eyes    Degenerative joint disease    Diverticulosis of colon    Dyspnea    with exertion   Heart murmur    Hyperlipidemia    NMR 07/2009: LDL 200 (2260/1233)TG 99, HDL 65. LDL goal =<120, ideally <100. father MI @ 52   Hypertension    Hypothyroidism    Dr Wilson Singer   Microscopic hematuria    Peripheral neuropathy    compressive in UE bilaterally; Dr Daylene Katayama   Peripheral vascular disease ([place])    ICA bilat, Dr.Charles fields, [facility]   Pneumonia    2017   PONV (postoperative nausea and vomiting)    "Inner ear, does  okay with Scopolamine"   Pulmonary embolus ([place])    Rectocele    [place] spotted fever     Patient Active Problem List   Diagnosis Date Noted   Hypertension, accelerated with heart disease, without  CHF 09/19/2020   Atypical chest pain 09/18/2020   SSS (sick sinus syndrome) ([place]) 12/06/2019   Pacemaker 12/06/2019   Hypercholesterolemia 12/06/2019   Underweight 12/06/2019   Pneumonia of right upper lobe due to methicillin resistant Staphylococcus aureus (MRSA) ([place])    Pneumonia due to infectious organism    Community acquired pneumonia 08/16/2018   Malnutrition of moderate degree 07/16/2017   Hemoptysis 07/15/2017   HNP (herniated nucleus pulposus), lumbar 03/20/2017   Edema of both feet 12/10/2016   Tick bite 11/21/2015   Abnormal urine odor 11/21/2015   Other fatigue 11/21/2015   Anxiety 08/15/2015   Encounter for pre-operative cardiovascular clearance 07/10/2015   Pre-operative clearance 07/07/2015   Rotator cuff tear 07/07/2015   Rectocele, female 08/04/2014   COPD with chronic bronchitis and emphysema ([place]) 02/14/2014   Other malaise and fatigue 12/31/2013   PAF (paroxysmal atrial fibrillation) ([place]) 01/10/2013   Thrombosed external hemorrhoid 11/26/2012   Chronic renal insufficiency, stage III (moderate) (HCC) 09/03/2011   Atherosclerosis of native arteries of the extremities with intermittent claudication 04/04/2011   Occlusion and stenosis of carotid artery without mention of cerebral infarction 04/04/2011   PERIPHERAL NEUROPATHY 08/15/2008   Hypothyroidism (acquired) 08/12/2007   Hyperlipidemia with target LDL less than 100 08/12/2007  Essential hypertension 08/12/2007   Osteopenia 08/12/2007   Carotid artery stenosis, asymptomatic 08/11/2007   Osteoarthritis 08/11/2007   BASAL CELL CARCINOMA, HX OF 08/11/2007   FIBROCYSTIC BREAST DISEASE 11/18/2006   DIVERTICULOSIS, COLON 08/10/2006    Past Surgical History:  Procedure Laterality Date   ABDOMINAL HYSTERECTOMY  1973   age 23 due to dysfuctional menses; HRT x 25-30 years   APPENDECTOMY  1952   basal cell cancer  03/2006   leg   BILATERAL OOPHORECTOMY  1990   prophylactically (sister had ovarian ca)    BRONCHIAL BRUSHINGS  08/30/2019   Procedure: BRONCHIAL BRUSHINGS;  Surgeon: Candee Furbish, MD;  Location: [facility];  Service: Endoscopy;;   BRONCHIAL WASHINGS  08/30/2019   Procedure: BRONCHIAL WASHINGS;  Surgeon: Candee Furbish, MD;  Location: [facility];  Service: Endoscopy;;   CARPAL TUNNEL RELEASE Bilateral 1989   right   CATARACT EXTRACTION, BILATERAL  2010   Dr Kathrin Penner   CHOLECYSTECTOMY  2006   COLONOSCOPY     Dr Ardis Hughs   EYE SURGERY Left    Bilateral eye (film removed lt eye 01/09/17)   LUMBAR LAMINECTOMY/DECOMPRESSION MICRODISCECTOMY Left 03/20/2017   Procedure: LEFT LUMBAR FOURLUMBAR FIVE LAMINOTOMY AND MICROICRODISCECTOMY 1 LEVEL;  Surgeon: Jovita Gamma, MD;  Location: [facility];  Service: Neurosurgery;  Laterality: Left;  LEFT   NM MYOVIEW LTD  06/13/2008   low risk scan   ORTHOPEDIC SURGERY  1989/200/2002/2012   elbows,shoulder surgery x 3, right hand   PACEMAKER IMPLANT N/A 09/06/2019   Procedure: PACEMAKER IMPLANT;  Surgeon: Evans Lance, MD;  Location: [place] [facility];  Service: Cardiovascular;  Laterality: N/A;   RECTOCELE REPAIR  2016   TONSILLECTOMY  1957   US ECHOCARDIOGRAPHY  05/01/2010   trace MR,AI,TR;EF =>55%   VIDEO BRONCHOSCOPY Bilateral 07/17/2017   Procedure: VIDEO BRONCHOSCOPY WITHOUT FLUORO;  Surgeon: Rigoberto Noel, MD;  Location: [facility];  Service: Cardiopulmonary;  Laterality: Bilateral;   VIDEO BRONCHOSCOPY Bilateral 09/02/2017   Procedure: VIDEO BRONCHOSCOPY WITHOUT FLUORO;  Surgeon: Rigoberto Noel, MD;  Location: [facility];  Service: Cardiopulmonary;  Laterality: Bilateral;   VIDEO BRONCHOSCOPY N/A 08/30/2019   Procedure: VIDEO BRONCHOSCOPY WITHOUT FLUORO;  Surgeon: Candee Furbish, MD;  Location: [facility];  Service: Endoscopy;  Laterality: N/A;     OB History     Gravida  3   Para      Term      Preterm      AB  2   Living  1      SAB  2   IAB      Ectopic      Multiple      Live Births               Family History  Problem Relation Age of Onset   Heart attack Father 17   Heart disease Father        Before age 39   Colon cancer Mother 39   Stroke Mother 62   Kidney disease Mother        ? renal calculi; S/P resecton of kidney   Cancer - Colon Mother 47   Ovarian cancer Sister    Other Sister        Valve replacement (aortic & mitral ) in 2 sisters   Heart disease Sister        Before age 82   Heart disease Brother        aortic & mitral valve  replacement in 2 bro; both had CBAG   Diabetes Neg Hx    Breast cancer Neg Hx     Social History   Tobacco Use   Smoking status: Never   Smokeless tobacco: Never  Vaping Use   Vaping Use: Never used  Substance Use Topics   Alcohol use: No    Alcohol/week: 0.0 standard drinks   Drug use: No    Home Medications Prior to Admission medications   Medication Sig Start Date End Date Taking? Authorizing Provider  apixaban (ELIQUIS) 2.5 MG TABS tablet Take 1 tablet (2.5 mg total) by mouth 2 (two) times daily. 01/09/21  Yes Croitoru, Mihai, MD  b complex vitamins tablet Take 1 tablet by mouth daily after supper.   Yes [provider]  cholecalciferol (VITAMIN D3) 25 MCG (1000 UNIT) tablet Take 1,000 Units by mouth daily.   Yes [provider]  cyclobenzaprine (FLEXERIL) 10 MG tablet Take 10 mg by mouth 2 (two) times daily as needed for muscle spasms (scheduled every every).    Yes [provider]  diclofenac sodium (VOLTAREN) 1 % GEL Apply 2-4 g topically 4 (four) times daily as needed (for pain).    Yes [provider]  hydrALAZINE (APRESOLINE) 25 MG tablet Take 1 tablet (25 mg total) by mouth every 8 (eight) hours as needed (if systolic bp more than 086 mmhg). 09/19/20 09/19/21 Yes Shelly Coss, MD  levothyroxine (SYNTHROID) 75 MCG tablet Take 1 tablet (75 mcg total) by mouth daily before breakfast. Taking the brand "synthroid" 09/07/19  Yes Dessa Phi, DO  LORazepam (ATIVAN) 0.5 MG tablet Take  1 tablet (0.5 mg total) by mouth 3 (three) times daily as needed. Patient taking differently: Take 0.5 mg by mouth at bedtime. 10/17/15  Yes Burns, Claudina Lick, MD  meclizine (ANTIVERT) 25 MG tablet Take 1 tablet (25 mg total) by mouth daily. 1/2 every 8 hrs prn for imbalance Patient taking differently: Take 12.5-25 mg by mouth daily. 11/01/10  Yes Hendricks Limes, MD  senna (SENOKOT) 8.6 MG tablet Take 1 tablet by mouth every other day.    Yes [provider]  traMADol (ULTRAM) 50 MG tablet 1/2-1 by mouth once daily as needed. Patient taking differently: Take 25-50 mg by mouth 2 (two) times daily as needed for moderate pain or severe pain (1 tablet by mouth scheduled each bedtime.). 10/17/15  Yes Burns, Claudina Lick, MD  flecainide (TAMBOCOR) 50 MG tablet Take 1.5 tablets (75 mg total) by mouth 2 (two) times daily. TAKE 1 TABLET(50 MG) BY MOUTH EVERY 12 HOURS 01/23/21   Baldwin Jamaica, PA-C  metoprolol succinate (TOPROL-XL) 100 MG 24 hr tablet Take 1 tablet (100 mg total) by mouth daily. Take with or immediately following a meal. 01/23/21 10/20/21  Baldwin Jamaica, PA-C    Allergies    Aspirin, Lovastatin, Benazepril hcl, Paroxetine, Tape, Valacyclovir hcl, Bupropion hcl, Ezetimibe, Fenofibrate, and Pravastatin sodium  Review of Systems   Review of Systems  HENT:  Negative for ear pain, rhinorrhea, sneezing and sore throat.   Eyes:  Negative for photophobia and visual disturbance.  Respiratory:  Negative for cough, chest tightness, shortness of breath and wheezing.   Skin:  Negative for rash.   Physical Exam Updated Vital Signs BP (!) 137/98   Pulse 91   Temp 97.8 F (36.6 C) (Oral)   Resp 17   Ht 5\' 6"  (1.676 m)   Wt 45.4 kg   SpO2 95%   BMI 16.14 kg/m  Physical Exam Vitals and nursing note reviewed.  Constitutional:      General: She is not in acute distress.    Appearance: She is well-developed.  HENT:     Head: Normocephalic and atraumatic.     Nose: Nose normal.  Eyes:      General: No scleral icterus.       Left eye: No discharge.     Conjunctiva/sclera: Conjunctivae normal.  Cardiovascular:     Rate and Rhythm: Normal rate and regular rhythm.     Heart sounds: Normal heart sounds. No murmur heard.   No friction rub. No gallop.  Pulmonary:     Effort: Pulmonary effort is normal. No respiratory distress.     Breath sounds: Normal breath sounds.  Abdominal:     General: Bowel sounds are normal. There is no distension.     Palpations: Abdomen is soft.     Tenderness: There is no abdominal tenderness. There is no guarding.     Comments: Abdomen is soft, nontender nondistended.  Musculoskeletal:        General: Normal range of motion.     Cervical back: Normal range of motion and neck supple.  Skin:    General: Skin is warm and dry.     Findings: No rash.  Neurological:     Mental Status: She is alert.     Cranial Nerves: No cranial nerve deficit.     Motor: No weakness or abnormal muscle tone.     Coordination: Coordination normal.     Comments: Pupils reactive. No facial asymmetry noted. Cranial nerves appear grossly intact. Sensation intact to light touch on face, BUE and BLE. Strength 5/5 in BUE and BLE.     ED Results / Procedures / Treatments   Labs (all labs ordered are listed, but only abnormal results are displayed) Labs Reviewed  CBC WITH DIFFERENTIAL/PLATELET - Abnormal; Notable for the following components:      Result Value   Neutro Abs 7.9 (*)    All other components within normal limits  COMPREHENSIVE METABOLIC PANEL - Abnormal; Notable for the following components:   CO2 20 (*)    BUN 41 (*)    Creatinine, Ser 2.22 (*)    Calcium 8.7 (*)    Total Protein 6.3 (*)    Albumin 2.5 (*)    GFR, Estimated 22 (*)    All other components within normal limits  TROPONIN I (HIGH SENSITIVITY) - Abnormal; Notable for the following components:   Troponin I (High Sensitivity) 42 (*)    All other components within normal limits  RESP  PANEL BY RT-PCR (FLU A&B, COVID) [id number]  URINALYSIS, ROUTINE W REFLEX MICROSCOPIC    EKG EKG Interpretation  Date/Time:  Tuesday January 23 2021 14:06:20 EDT Ventricular Rate:  102 PR Interval:  156 QRS Duration: 88 QT Interval:  350 QTC Calculation: 456 R Axis:   35 Text Interpretation: Sinus tachycardia Possible Left atrial enlargement Minimal voltage criteria for LVH, may be normal variant ( Cornell product ) No significant change since last tracing Confirmed by Blanchie Dessert [id number]) on 01/23/2021 6:04:54 PM  Radiology DG Chest 2 View  Result Date: 01/23/2021 CLINICAL DATA:  Weakness and dizziness EXAM: CHEST - 2 VIEW COMPARISON:  Chest x-ray dated September 18, 2020 FINDINGS: Cardiac and mediastinal contours are unchanged. Scattered bilateral linear opacities, similar to prior exam and likely due to scarring or atelectasis. No large pleural effusion or pneumothorax. Left chest wall dual lead pacer with unchanged lead  positioning. IMPRESSION: No active cardiopulmonary disease. Electronically Signed   By: Yetta Glassman MD   On: 01/23/2021 15:26    Procedures Procedures   Medications Ordered in ED Medications  sodium chloride 0.9 % bolus 1,000 mL (1,000 mLs Intravenous New Bag/Given 01/23/21 1816)    ED Course  I have reviewed the triage vital signs and the nursing notes.  Pertinent labs & imaging results that were available during my care of the patient were reviewed by me and considered in my medical decision making (see chart for details).  Clinical Course as of 01/23/21 1826  Tue Jan 23, 2021  1527 Troponin I (High Sensitivity)(!): 42 [HK]  1527 Creatinine(!): 2.22 Baseline appears about 1.6 [HK]  1558 Patient was in A. fib with RVR when EMS arrived.  She converted to sinus rhythm after fluids were given.  She was evaluated by cardiology at the bedside, Dr. Rayann Heman.  Her dose of flecainide will be changed and prescription will be called in. [HK]  1608 SARS Coronavirus 2 by RT  PCR: NEGATIVE [HK]    Clinical Course User Index [HK] Delia Heady, PA-C   MDM Rules/Calculators/A&P                           80 year old female with past medical history of hypertension presenting to the ED with a chief complaint of dizziness, generalized weakness and decreased p.o. intake.  Has been feeling like she is having palpitations over the past few days.  Her generalized weakness, dizziness and decreased appetite have been going on for 1 week.  No specific trigger.  No injuries or falls per denies any chest pain, abdominal pain, vomiting or diarrhea.  On exam patient without neurological deficits.  She is moving all extremities out difficulty.  EMS states that she was in A. fib with RVR when they arrived but converted to normal sinus rhythm with IV fluids.  Patient evaluated by cardiology at the bedside.  During my evaluation she was in sinus rhythm with rates in the 90s.  She continues to deny any chest pain.  She been compliant with her medications.  She was cleared from a cardiology standpoint.  Her first troponin was 42 but I feel this is likely because she has been in and out of A. fib.  CMP shows a creatinine of 2.22 which is higher than her baseline of 1.6.  Her COVID test is negative.  Her CBC is unremarkable.  Chest x-ray shows no acute abnormalities.  EKG shows sinus tachycardia, no changes from prior tracings.  Suspect that her symptoms could be due to her AKI and dehydration.  She had her medications adjusted by cardiology but I feel that she will benefit from admission to see if her kidney function improves with IV hydration.  Patient is agreeable to the plan.  Will admit to hospital service    Portions of this note were generated with Dragon dictation software. Dictation errors may occur despite best attempts at proofreading.  Final Clinical Impression(s) / ED Diagnoses Final diagnoses:  AKI (acute kidney injury) ([place])    Rx / DC Orders ED Discharge Orders           Ordered    flecainide (TAMBOCOR) 50 MG tablet  2 times daily        01/23/21 1619    metoprolol succinate (TOPROL-XL) 100 MG 24 hr tablet  Daily        01/23/21 1619  Delia Heady, PA-C 01/23/21 1826    Blanchie Dessert, MD 01/23/21 2142

## 2021-01-23 NOTE — ED Triage Notes (Addendum)
Patient presents to the ED by EMS with c/o Dizziness and weakness along with poor PO intake. From home. No chest pain, has a pacemaker.  Initially A.Fib RVR at 150 bpm after 1 l NS, EKG shows SR.  BP 100/60 now 119/80. A/O x 3

## 2021-01-23 NOTE — ED Provider Notes (Signed)
Emergency Medicine Provider Triage Evaluation Note  Deborah Shields , a 80 y.o. female  was evaluated in triage.  Pt complains of weakness and palpitations for 1 week. Denies sob, fever, vomiting.  Was in afib with rvr with ems, 1l fluid given and pt converted.  Review of Systems  Positive: Weakness, palpitations Negative: Sob, fever, vomiting  Physical Exam  BP 110/82 (BP Location: Left Arm)   Pulse (!) 103   Temp 97.8 F (36.6 C) (Oral)   Resp 15   SpO2 100%  Gen:   Awake, no distress   Resp:  Normal effort  MSK:   Moves extremities without difficulty  Other:  Lungs ctab, heart with rrr  Medical Decision Making  Medically screening exam initiated at 2:16 PM.  Appropriate orders placed.  [name] was informed that the remainder of the evaluation will be completed by another provider, this initial triage assessment does not replace that evaluation, and the importance of remaining in the ED until their evaluation is complete.     Rodney Booze, [place] 01/23/21 1417    Wyvonnia Dusky, MD 01/23/21 [phone]

## 2021-01-23 NOTE — Telephone Encounter (Signed)
If we can, I think it is important to bounce this off Dr. Lovena Le, since he is actually following her pacemaker and her antiarrhythmics.  Unfortunately, I am afraid he might be out of town.  May be we could ask the opinion of the EP on-call? My personal advice is to stop the flecainide, since this is probably causing the 1:1 AV conduction with the extreme high ventricular rates.  Now she is back in atrial fibrillation and rate control is better, although still RVR.   Has she taken the increased dose of metoprolol?   It may be time to switch her to amiodarone. If she is symptomatic, she would benefit from a visit to the emergency room for cardioversion.

## 2021-01-23 NOTE — H&P (Signed)
History and Physical    Deborah Shields AJG:[id number] DOB: Jul 11, 1940 DOA: 01/23/2021  PCP: Lajean Manes, MD   Patient coming from: Home  I have personally briefly reviewed patient's old medical records in [place]  CC: feeling weak HPI: 80 year old female with a history of paroxysmal atrial fibrillation/sick sinus syndrome status post pacemaker on chronic anticoagulation with Eliquis presents to the ER today with a 3-day history of palpitations at home.  She states that she knows that this was her atrial fibrillation.  She has felt weak.  She called EMS.  She is noted to be in rapid A. fib.  Patient given IV fluids in the ambulance which resolved her A. fib.  Patient present to the ER for evaluation.  Patient noted to be in acute kidney injury with elevated serum creatinine.  Patient states that she has not been eating or drinking well the last couple days.  She states that she has no energy.  She states that she has not been able to fix any meals due to her weakness.  Patient lives alone.  Cardiology has been consulted by the ER due to her atrial fibrillation.  Due to concern of her AKI, [facility] hospitalist contacted for admission.   ED Course: pt given IVF by EMS which converted her back to NSR from rapid afib.  Review of Systems:  Review of Systems  Constitutional:  Negative for chills, fever and malaise/fatigue.  HENT: Negative.    Eyes: Negative.   Respiratory: Negative.    Cardiovascular:  Positive for palpitations. Negative for chest pain.  Gastrointestinal: Negative.   Genitourinary: Negative.   Musculoskeletal: Negative.   Skin: Negative.   Neurological:  Positive for weakness.  Endo/Heme/Allergies:        Anorexia  Psychiatric/Behavioral: Negative.    All other systems reviewed and are negative.  Past Medical History:  Diagnosis Date   Anxiety    Aortic insufficiency    mild due to degenerative changes   Arthritis    Basal cell carcinoma 2007   [place]  [facility] Left leg   Carotid artery occlusion    Chronic kidney disease    CRD Stage 3   Conjunctivitis due to adenovirus, both eyes    Degenerative joint disease    Diverticulosis of colon    Dyspnea    with exertion   Heart murmur    Hyperlipidemia    NMR 07/2009: LDL 200 (2260/1233)TG 99, HDL 65. LDL goal =<120, ideally <100. father MI @ 57   Hypertension    Hypothyroidism    Dr Wilson Singer   Microscopic hematuria    Peripheral neuropathy    compressive in UE bilaterally; Dr Daylene Katayama   Peripheral vascular disease ([place])    ICA bilat, Dr.Charles fields, [facility]   Pneumonia    2017   PONV (postoperative nausea and vomiting)    "Inner ear, does  okay with Scopolamine"   Pulmonary embolus ([place])    Rectocele    [place] spotted fever     Past Surgical History:  Procedure Laterality Date   ABDOMINAL HYSTERECTOMY  1973   age 59 due to dysfuctional menses; HRT x 25-30 years   APPENDECTOMY  1952   basal cell cancer  03/2006   leg   BILATERAL OOPHORECTOMY  1990   prophylactically (sister had ovarian ca)   BRONCHIAL BRUSHINGS  08/30/2019   Procedure: BRONCHIAL BRUSHINGS;  Surgeon: Candee Furbish, MD;  Location: [facility];  Service: Endoscopy;;   BRONCHIAL WASHINGS  08/30/2019   Procedure: BRONCHIAL WASHINGS;  Surgeon: Candee Furbish, MD;  Location: [facility];  Service: Endoscopy;;   CARPAL TUNNEL RELEASE Bilateral 1989   right   CATARACT EXTRACTION, BILATERAL  2010   Dr Kathrin Penner   CHOLECYSTECTOMY  2006   COLONOSCOPY     Dr Ardis Hughs   EYE SURGERY Left    Bilateral eye (film removed lt eye 01/09/17)   LUMBAR LAMINECTOMY/DECOMPRESSION MICRODISCECTOMY Left 03/20/2017   Procedure: LEFT LUMBAR FOURLUMBAR FIVE LAMINOTOMY AND MICROICRODISCECTOMY 1 LEVEL;  Surgeon: Jovita Gamma, MD;  Location: [facility];  Service: Neurosurgery;  Laterality: Left;  LEFT   NM MYOVIEW LTD  06/13/2008   low risk scan   ORTHOPEDIC SURGERY  1989/200/2002/2012   elbows,shoulder surgery x 3, right hand    PACEMAKER IMPLANT N/A 09/06/2019   Procedure: PACEMAKER IMPLANT;  Surgeon: Evans Lance, MD;  Location: [facility];  Service: Cardiovascular;  Laterality: N/A;   RECTOCELE REPAIR  2016   TONSILLECTOMY  1957   US ECHOCARDIOGRAPHY  05/01/2010   trace MR,AI,TR;EF =>55%   VIDEO BRONCHOSCOPY Bilateral 07/17/2017   Procedure: VIDEO BRONCHOSCOPY WITHOUT FLUORO;  Surgeon: Rigoberto Noel, MD;  Location: [facility];  Service: Cardiopulmonary;  Laterality: Bilateral;   VIDEO BRONCHOSCOPY Bilateral 09/02/2017   Procedure: VIDEO BRONCHOSCOPY WITHOUT FLUORO;  Surgeon: Rigoberto Noel, MD;  Location: [facility];  Service: Cardiopulmonary;  Laterality: Bilateral;   VIDEO BRONCHOSCOPY N/A 08/30/2019   Procedure: VIDEO BRONCHOSCOPY WITHOUT FLUORO;  Surgeon: Candee Furbish, MD;  Location: [facility];  Service: Endoscopy;  Laterality: N/A;     reports that she has never smoked. She has never used smokeless tobacco. She reports that she does not drink alcohol and does not use drugs.  Allergies  Allergen Reactions   Aspirin Nausea Only and Other (See Comments)    REACTION: GI upset  ( pt can take 81 mg but NOT   325 mg ASA )   Lovastatin Nausea Only and Other (See Comments)    REACTION: nausea   Benazepril Hcl Other (See Comments)    Unknown   Paroxetine Other (See Comments)    Unknown   Tape Other (See Comments)   Valacyclovir Hcl Nausea And Vomiting   Bupropion Hcl Other (See Comments)    REACTION: tinnitis   Ezetimibe Nausea And Vomiting and Other (See Comments)    REACTION: GI symptoms   Fenofibrate Other (See Comments)    Myalgia    Pravastatin Sodium Other (See Comments)    REACTION: elevated CPK - Muscle's in bilateral Leg    Family History  Problem Relation Age of Onset   Heart attack Father 68   Heart disease Father        Before age 46   Colon cancer Mother 57   Stroke Mother 43   Kidney disease Mother        ? renal calculi; S/P resecton of kidney   Cancer - Colon  Mother 58   Ovarian cancer Sister    Other Sister        Valve replacement (aortic & mitral ) in 2 sisters   Heart disease Sister        Before age 12   Heart disease Brother        aortic & mitral valve replacement in 2 bro; both had CBAG   Diabetes Neg Hx    Breast cancer Neg Hx     Prior to Admission medications   Medication Sig Start Date End  Date Taking? Authorizing Provider  apixaban (ELIQUIS) 2.5 MG TABS tablet Take 1 tablet (2.5 mg total) by mouth 2 (two) times daily. 01/09/21  Yes Croitoru, Mihai, MD  b complex vitamins tablet Take 1 tablet by mouth daily after supper.   Yes [provider]  cholecalciferol (VITAMIN D3) 25 MCG (1000 UNIT) tablet Take 1,000 Units by mouth daily.   Yes [provider]  cyclobenzaprine (FLEXERIL) 10 MG tablet Take 10 mg by mouth 2 (two) times daily as needed for muscle spasms (scheduled every every).    Yes [provider]  diclofenac sodium (VOLTAREN) 1 % GEL Apply 2-4 g topically 4 (four) times daily as needed (for pain).    Yes [provider]  hydrALAZINE (APRESOLINE) 25 MG tablet Take 1 tablet (25 mg total) by mouth every 8 (eight) hours as needed (if systolic bp more than 330 mmhg). 09/19/20 09/19/21 Yes Shelly Coss, MD  levothyroxine (SYNTHROID) 75 MCG tablet Take 1 tablet (75 mcg total) by mouth daily before breakfast. Taking the brand "synthroid" 09/07/19  Yes Dessa Phi, DO  LORazepam (ATIVAN) 0.5 MG tablet Take 1 tablet (0.5 mg total) by mouth 3 (three) times daily as needed. Patient taking differently: Take 0.5 mg by mouth at bedtime. 10/17/15  Yes Burns, Claudina Lick, MD  meclizine (ANTIVERT) 25 MG tablet Take 1 tablet (25 mg total) by mouth daily. 1/2 every 8 hrs prn for imbalance Patient taking differently: Take 12.5-25 mg by mouth daily. 11/01/10  Yes Hendricks Limes, MD  senna (SENOKOT) 8.6 MG tablet Take 1 tablet by mouth every other day.    Yes [provider]  traMADol (ULTRAM) 50 MG  tablet 1/2-1 by mouth once daily as needed. Patient taking differently: Take 25-50 mg by mouth 2 (two) times daily as needed for moderate pain or severe pain (1 tablet by mouth scheduled each bedtime.). 10/17/15  Yes Burns, Claudina Lick, MD  flecainide (TAMBOCOR) 50 MG tablet Take 1.5 tablets (75 mg total) by mouth 2 (two) times daily. TAKE 1 TABLET(50 MG) BY MOUTH EVERY 12 HOURS 01/23/21   Baldwin Jamaica, PA-C  metoprolol succinate (TOPROL-XL) 100 MG 24 hr tablet Take 1 tablet (100 mg total) by mouth daily. Take with or immediately following a meal. 01/23/21 10/20/21  Baldwin Jamaica, PA-C    Physical Exam: Vitals:   01/23/21 1730 01/23/21 1745 01/23/21 1900 01/23/21 2030  BP: (!) 141/87 (!) 137/98 (!) 150/81 139/68  Pulse: 68 91 65 77  Resp: 14 17 20 14   Temp:      TempSrc:      SpO2: 97% 95% 95% 94%  Weight:      Height:        Physical Exam Vitals and nursing note reviewed.  Constitutional:      General: She is not in acute distress.    Appearance: Normal appearance. She is not ill-appearing, toxic-appearing or diaphoretic.     Comments: Thin and malnourished  HENT:     Head: Normocephalic and atraumatic.     Nose: Nose normal.  Cardiovascular:     Rate and Rhythm: Normal rate and regular rhythm.     Heart sounds: Murmur heard.  Systolic murmur is present with a grade of 2/6.  Pulmonary:     Effort: Pulmonary effort is normal. No respiratory distress.     Breath sounds: No wheezing or rales.  Abdominal:     General: Abdomen is flat. Bowel sounds are normal. There is no distension.  Palpations: Abdomen is soft.     Tenderness: There is no abdominal tenderness. There is no guarding or rebound.  Musculoskeletal:     Right lower leg: No edema.     Left lower leg: No edema.  Skin:    General: Skin is warm and dry.     Capillary Refill: Capillary refill takes less than 2 seconds.  Neurological:     General: No focal deficit present.     Mental Status: She is alert and  oriented to person, place, and time.     Labs on Admission: I have personally reviewed following labs and imaging studies  CBC: Recent Labs  Lab 01/23/21 1419  WBC 10.3  NEUTROABS 7.9*  HGB 12.4  HCT 37.6  MCV 90.2  PLT 629   Basic Metabolic Panel: Recent Labs  Lab 01/23/21 1419  NA 136  K 3.5  CL 106  CO2 20*  GLUCOSE 95  BUN 41*  CREATININE 2.22*  CALCIUM 8.7*   GFR: Estimated Creatinine Clearance: 14 mL/min (A) (by C-G formula based on SCr of 2.22 mg/dL (H)). Liver Function Tests: Recent Labs  Lab 01/23/21 1419  AST 17  ALT 16  ALKPHOS 91  BILITOT 1.1  PROT 6.3*  ALBUMIN 2.5*   No results for input(s): LIPASE, AMYLASE in the last 168 hours. No results for input(s): AMMONIA in the last 168 hours. Coagulation Profile: No results for input(s): INR, PROTIME in the last 168 hours. Cardiac Enzymes: No results for input(s): CKTOTAL, CKMB, CKMBINDEX, TROPONINI in the last 168 hours. BNP (last 3 results) No results for input(s): PROBNP in the last 8760 hours. HbA1C: No results for input(s): HGBA1C in the last 72 hours. CBG: No results for input(s): GLUCAP in the last 168 hours. Lipid Profile: No results for input(s): CHOL, HDL, LDLCALC, TRIG, CHOLHDL, LDLDIRECT in the last 72 hours. Thyroid Function Tests: No results for input(s): TSH, T4TOTAL, FREET4, T3FREE, THYROIDAB in the last 72 hours. Anemia Panel: No results for input(s): VITAMINB12, FOLATE, FERRITIN, TIBC, IRON, RETICCTPCT in the last 72 hours. Urine analysis:    Component Value Date/Time   COLORURINE YELLOW 08/17/2018 0505   APPEARANCEUR CLEAR 08/17/2018 0505   LABSPEC 1.024 08/17/2018 0505   PHURINE 6.0 08/17/2018 0505   GLUCOSEU NEGATIVE 08/17/2018 0505   GLUCOSEU NEGATIVE 11/21/2015 1156   HGBUR MODERATE (A) 08/17/2018 0505   BILIRUBINUR NEGATIVE 08/17/2018 0505   BILIRUBINUR Neg 09/07/2012 1426   KETONESUR NEGATIVE 08/17/2018 0505   PROTEINUR NEGATIVE 08/17/2018 0505   UROBILINOGEN 0.2  11/21/2015 1156   NITRITE NEGATIVE 08/17/2018 0505   LEUKOCYTESUR NEGATIVE 08/17/2018 0505    Radiological Exams on Admission: I have personally reviewed images DG Chest 2 View  Result Date: 01/23/2021 CLINICAL DATA:  Weakness and dizziness EXAM: CHEST - 2 VIEW COMPARISON:  Chest x-ray dated September 18, 2020 FINDINGS: Cardiac and mediastinal contours are unchanged. Scattered bilateral linear opacities, similar to prior exam and likely due to scarring or atelectasis. No large pleural effusion or pneumothorax. Left chest wall dual lead pacer with unchanged lead positioning. IMPRESSION: No active cardiopulmonary disease. Electronically Signed   By: Yetta Glassman MD   On: 01/23/2021 15:26    EKG: I have personally reviewed EKG: NSR   Assessment/Plan Principal Problem:   AKI (acute kidney injury) ([place]) Active Problems:   PAF (paroxysmal atrial fibrillation) ([place])   Essential hypertension   Hypothyroidism (acquired)   COPD with chronic bronchitis and emphysema ([place])   Pacemaker    Pacemaker Stable. May  need PPM interrogated to see how long pt was in rapid afib over the last 3 days.  AKI (acute kidney injury) ([place]) Admit to telemetry bed. Observation. Gentle IVF overnight. Repeat CMP in AM.  PAF (paroxysmal atrial fibrillation) ([facility]) On flecainide and toprol-xl. Cardiology consulted by ER. Continue Eliquis. Replete K+. Keep serum potassium >4 and magnesium >2  Essential hypertension Stable.  COPD with chronic bronchitis and emphysema ([facility]) Stable.  Hypothyroidism (acquired) Check TSH.  DVT prophylaxis: Eliquis Code Status: Full Code Family Communication: no family at bedside  Disposition Plan: plan on returning home at discharge  Consults called: cardiology called by EDP  Admission status: Observation, Telemetry bed   Kristopher Oppenheim, DO [facility] 01/23/2021, 9:07 PM

## 2021-01-23 NOTE — Discharge Instructions (Signed)
You have an appointment set up with the [facility].  Multiple studies have shown that being followed by a dedicated atrial fibrillation clinic in addition to the standard care you receive from your other physicians improves health. We believe that enrollment in the atrial fibrillation clinic will allow [place] to better care for you.   The phone number to the [facility] is [phone]. The clinic is staffed Monday through Friday from 8:30am to 5pm.  Parking Directions: The clinic is located in the Heart and Vascular Building connected to [facility]. 1)From [address] turn on to [address] and go to the 3rd entrance  (Heart and Vascular entrance) on the right. 2)Look to the right for Heart &Vascular Parking Garage. 3)A code for the entrance is required, for August is 5544.   4)Take the elevators to the 1st floor. Registration is in the room with the glass walls at the end of the hallway.  If you have any trouble parking or locating the clinic, please don't hesitate to call [phone].

## 2021-01-23 NOTE — Subjective & Objective (Signed)
CC: feeling weak HPI: 80 year old female with a history of paroxysmal atrial fibrillation/sick sinus syndrome status post pacemaker on chronic anticoagulation with Eliquis presents to the ER today with a 3-day history of palpitations at home.  She states that she knows that this was her atrial fibrillation.  She has felt weak.  She called EMS.  She is noted to be in rapid A. fib.  Patient given IV fluids in the ambulance which resolved her A. fib.  Patient present to the ER for evaluation.  Patient noted to be in acute kidney injury with elevated serum creatinine.  Patient states that she has not been eating or drinking well the last couple days.  She states that she has no energy.  She states that she has not been able to fix any meals due to her weakness.  Patient lives alone.  Cardiology has been consulted by the ER due to her atrial fibrillation.  Due to concern of her AKI, [facility] hospitalist contacted for admission.

## 2021-01-23 NOTE — Assessment & Plan Note (Addendum)
On flecainide and toprol-xl. Cardiology consulted by ER. Continue Eliquis. Replete K+. Keep serum potassium >4 and magnesium >2

## 2021-01-23 NOTE — Assessment & Plan Note (Signed)
Check TSH 

## 2021-01-23 NOTE — Telephone Encounter (Signed)
Deborah Shields  (DPR), called and advised about event of AF w/ RVR. Patient states during this time she was asleep and does not recall any symptoms. Patient reports over the past few weeks she has increased fatigue and currently today dizziness. Patient reports she does have hx of vertigo and takes Meclizine (has been taking, reports improvement with meclizine). States for the past 2 days she has not been able to get out of the bed much d/t increased fatigue and dizziness. While talking to patient on the phone, she got out of bed to get a piece of paper and notably became short of breath which was resolved after she laid back down. Patient currently denies chest pain. States her pain is in her upper back that radiates from bilateral shoulders.   Patient advised per Dr. Sallyanne Kuster to increase metoprolol succinate 100 mg daily, STOP losartan. Patient verbalized understanding. EMR changes made.   Case reviewed with Dr. Curt Bears and agrees with patients symptoms she go to the ED for evaluation to assist with rate control. Patient will then need in-clinic follow up after.   Spoke to patients daughter Deborah Shields and advised Dr. Curt Bears recommendation is for patient to go to the emergency department considering her heart rate and patients symptoms. Deborah Shields states she is not able to get patient to ED but she will call 911. Patient was asleep during this conversation although Deborah Shields is with patient at this time. Will forward to Dr. Lovena Le and Croitoru for follow up.

## 2021-01-23 NOTE — Consult Note (Addendum)
Cardiology Consultation:   Patient ID: Deborah Shields MRN: [id number]; DOB: 09-07-1940  Admit date: 01/23/2021 Date of Consult: 01/23/2021  PCP:  Lajean Manes, MD   [facility] Providers Cardiologist:  Sanda Klein, MD EP: Dr. Lovena Le   Patient Profile:   Deborah Shields is a 80 y.o. female with a hx of Paroxysmal AFib, tachy/brady with post termination pauses >> PPM, HTN, HLD,  carotid artery disease due to fibromuscular dysplasia, CKD (III) who is being seen 01/23/2021 for the evaluation of AFib w/RVR at the request of Dr. Langston Masker.    Device information SJM dual chamber PPM implanted 09/06/2019  History of Present Illness:   Ms. Brinker last sw Dr. Loletha Grayer June 2021, at that time noted via her device some episodes of rapid Afib though low burden her flecainide continued unchanged, her metoprolol succ increased to 50mg    She had a brief hospitalization 09/18/20 with CP and HTN occ, BP controlled and planned for outpt ischemic w/u with atypical symptoms and suspect 2/2 HTN.  I don't see out pt cardiology f/u since then.  TODAY she was referred to the ER from the office (device clinic) with an ongoing episode of Afib w/RVR.  Noted 01/21/21 to have what appeared to be Aflutter with 1:1 conduction and in d/c Dr. Sallyanne Kuster recommended that her metoprolol succ be increased to 100mg  daily, her flecainide continued. Attempts were made to contact the patient and in further d/w Dr. Loletha Grayer., recommended that she be seen by Dr. Lovena Le to consider alternative AAD, her follow up transmission was in Afib though with ongoing RVR, and advised if symptomatic to send to the ER for DCCV and consider stopping Flecainide  The patient was feeling poorly, SOB and dizzy, given this in d/w DOD today recommended to go to the ER, the pt/family called 911. In route she was started on IVF and had spontaneous conversion to SR, in SR at her arrival to the ER. BP 110/82  LABS K+ 3.5 BUN/Creat 41/2.22 HS Trop  42 WBC 10.3 H/H 12/37 Plts 253  The patient can tell she is back in normal rhythm She has felt particularly fatigued of late, more winded with any exertion and could feel her heart racing No rest SOB no CP    Past Medical History:  Diagnosis Date   Anxiety    Aortic insufficiency    mild due to degenerative changes   Arthritis    Basal cell carcinoma 2007   [facility] Left leg   Carotid artery occlusion    Chronic kidney disease    CRD Stage 3   Conjunctivitis due to adenovirus, both eyes    Degenerative joint disease    Diverticulosis of colon    Dyspnea    with exertion   Heart murmur    Hyperlipidemia    NMR 07/2009: LDL 200 (2260/1233)TG 99, HDL 65. LDL goal =<120, ideally <100. father MI @ 24   Hypertension    Hypothyroidism    Dr Wilson Singer   Microscopic hematuria    Peripheral neuropathy    compressive in UE bilaterally; Dr Daylene Katayama   Peripheral vascular disease ([place])    ICA bilat, Dr.Charles fields, [facility]   Pneumonia    2017   PONV (postoperative nausea and vomiting)    "Inner ear, does  okay with Scopolamine"   Pulmonary embolus ([place])    Rectocele    [place] spotted fever     Past Surgical History:  Procedure Laterality Date   ABDOMINAL HYSTERECTOMY  32   age 44 due to dysfuctional menses; HRT x 25-30 years   APPENDECTOMY  1952   basal cell cancer  03/2006   leg   BILATERAL OOPHORECTOMY  1990   prophylactically (sister had ovarian ca)   BRONCHIAL BRUSHINGS  08/30/2019   Procedure: BRONCHIAL BRUSHINGS;  Surgeon: Candee Furbish, MD;  Location: [facility];  Service: Endoscopy;;   BRONCHIAL WASHINGS  08/30/2019   Procedure: BRONCHIAL WASHINGS;  Surgeon: Candee Furbish, MD;  Location: [facility];  Service: Endoscopy;;   CARPAL TUNNEL RELEASE Bilateral 1989   right   CATARACT EXTRACTION, BILATERAL  2010   Dr Kathrin Penner   CHOLECYSTECTOMY  2006   COLONOSCOPY     Dr Ardis Hughs   EYE SURGERY Left    Bilateral eye (film removed lt eye 01/09/17)    LUMBAR LAMINECTOMY/DECOMPRESSION MICRODISCECTOMY Left 03/20/2017   Procedure: LEFT LUMBAR FOURLUMBAR FIVE LAMINOTOMY AND MICROICRODISCECTOMY 1 LEVEL;  Surgeon: Jovita Gamma, MD;  Location: [facility];  Service: Neurosurgery;  Laterality: Left;  LEFT   NM MYOVIEW LTD  06/13/2008   low risk scan   ORTHOPEDIC SURGERY  1989/200/2002/2012   elbows,shoulder surgery x 3, right hand   PACEMAKER IMPLANT N/A 09/06/2019   Procedure: PACEMAKER IMPLANT;  Surgeon: Evans Lance, MD;  Location: [facility];  Service: Cardiovascular;  Laterality: N/A;   RECTOCELE REPAIR  2016   TONSILLECTOMY  1957   US ECHOCARDIOGRAPHY  05/01/2010   trace MR,AI,TR;EF =>55%   VIDEO BRONCHOSCOPY Bilateral 07/17/2017   Procedure: VIDEO BRONCHOSCOPY WITHOUT FLUORO;  Surgeon: Rigoberto Noel, MD;  [facility];  Service: Cardiopulmonary;  Laterality: Bilateral;   VIDEO BRONCHOSCOPY Bilateral 09/02/2017   Procedure: VIDEO BRONCHOSCOPY WITHOUT FLUORO;  Surgeon: Rigoberto Noel, MD;  [facility];  Service: Cardiopulmonary;  Laterality: Bilateral;   VIDEO BRONCHOSCOPY N/A 08/30/2019   Procedure: VIDEO BRONCHOSCOPY WITHOUT FLUORO;  Surgeon: Candee Furbish, MD;  [facility];  Service: Endoscopy;  Laterality: N/A;     Home Medications:  Prior to Admission medications   Medication Sig Start Date End Date Taking? Authorizing Provider  apixaban (ELIQUIS) 2.5 MG TABS tablet Take 1 tablet (2.5 mg total) by mouth 2 (two) times daily. 01/09/21   Croitoru, Mihai, MD  b complex vitamins tablet Take 1 tablet by mouth daily after supper.    [provider]  cholecalciferol (VITAMIN D3) 25 MCG (1000 UNIT) tablet Take 1,000 Units by mouth daily.    [provider]  cyclobenzaprine (FLEXERIL) 10 MG tablet Take 10 mg by mouth 2 (two) times daily as needed for muscle spasms (scheduled every every).     [provider]  diclofenac sodium (VOLTAREN) 1 % GEL Apply 2-4 g topically 4 (four) times  daily as needed (for pain).     [provider]  flecainide (TAMBOCOR) 50 MG tablet TAKE 1 TABLET(50 MG) BY MOUTH EVERY 12 HOURS 01/09/21   Croitoru, Mihai, MD  hydrALAZINE (APRESOLINE) 25 MG tablet Take 1 tablet (25 mg total) by mouth every 8 (eight) hours as needed (if systolic bp more than 093 mmhg). 09/19/20 09/19/21  Shelly Coss, MD  levothyroxine (SYNTHROID) 75 MCG tablet Take 1 tablet (75 mcg total) by mouth daily before breakfast. Taking the brand "synthroid" 09/07/19   Dessa Phi, DO  LORazepam (ATIVAN) 0.5 MG tablet Take 1 tablet (0.5 mg total) by mouth 3 (three) times daily as needed. Patient taking differently: Take 0.5 mg by mouth daily as needed for anxiety. 10/17/15  Binnie Rail, MD  meclizine (ANTIVERT) 25 MG tablet Take 1 tablet (25 mg total) by mouth daily. 1/2 every 8 hrs prn for imbalance Patient taking differently: Take 12.5-25 mg by mouth 2 (two) times daily as needed for dizziness. 11/01/10   Hendricks Limes, MD  metoprolol succinate (TOPROL-XL) 100 MG 24 hr tablet Take 1 tablet (100 mg total) by mouth daily. Take with or immediately following a meal. 01/23/21 04/23/21  Croitoru, Mihai, MD  senna (SENOKOT) 8.6 MG tablet Take 1 tablet by mouth every other day.     [provider]  traMADol (ULTRAM) 50 MG tablet 1/2-1 by mouth once daily as needed. Patient taking differently: Take 25-50 mg by mouth 2 (two) times daily as needed for moderate pain or severe pain (1 tablet by mouth scheduled each bedtime.). 10/17/15   Binnie Rail, MD    Inpatient Medications: Scheduled Meds:  Continuous Infusions:  PRN Meds:   Allergies:    Allergies  Allergen Reactions   Aspirin Nausea Only and Other (See Comments)    REACTION: GI upset  ( pt can take 81 mg but NOT   325 mg ASA )   Lovastatin Nausea Only and Other (See Comments)    REACTION: nausea   Benazepril Hcl Other (See Comments)    Unknown   Paroxetine Other (See Comments)    Unknown   Tape Other  (See Comments)   Bupropion Hcl Other (See Comments)    REACTION: tinnitis   Ezetimibe Nausea And Vomiting and Other (See Comments)    REACTION: GI symptoms   Fenofibrate Other (See Comments)    Myalgia    Pravastatin Sodium Other (See Comments)    REACTION: elevated CPK - Muscle's in bilateral Leg    Social History:   Social History   Socioeconomic History   Marital status: Widowed    Spouse name: Not on file   Number of children: 1   Years of education: Not on file   Highest education level: Not on file  Occupational History   Occupation: retired  Tobacco Use   Smoking status: Never   Smokeless tobacco: Never  Vaping Use   Vaping Use: Never used  Substance and Sexual Activity   Alcohol use: No    Alcohol/week: 0.0 standard drinks   Drug use: No   Sexual activity: Not on file  Other Topics Concern   Not on file  Social History Narrative   Low cholesterol diet   Regular exercise- yes    Social Determinants of Health   Financial Resource Strain: Not on file  Food Insecurity: Not on file  Transportation Needs: Not on file  Physical Activity: Not on file  Stress: Not on file  Social Connections: Not on file  Intimate Partner Violence: Not on file    Family History:   Family History  Problem Relation Age of Onset   Heart attack Father 77   Heart disease Father        Before age 60   Colon cancer Mother 46   Stroke Mother 47   Kidney disease Mother        ? renal calculi; S/P resecton of kidney   Cancer - Colon Mother 73   Ovarian cancer Sister    Other Sister        Valve replacement (aortic & mitral ) in 2 sisters   Heart disease Sister        Before age 78   Heart disease Brother  aortic & mitral valve replacement in 2 bro; both had CBAG   Diabetes Neg Hx    Breast cancer Neg Hx      ROS:  Please see the history of present illness.  All other ROS reviewed and negative.     Physical Exam/Data:   Vitals:   01/23/21 1413 01/23/21 1424   BP: 110/82   Pulse: (!) 103   Resp: 15   Temp: 97.8 F (36.6 C)   TempSrc: Oral   SpO2: 100%   Weight:  45.4 kg  Height:  5\' 6"  (1.676 m)   No intake or output data in the 24 hours ending 01/23/21 1513 Last 3 Weights 01/23/2021 09/18/2020 12/06/2019  Weight (lbs) 100 lb 105 lb 109 lb  Weight (kg) 45.36 kg 47.628 kg 49.442 kg     Body mass index is 16.14 kg/m.  General:  Well nourished, well developed, in no acute distress HEENT: normal Lymph: no adenopathy Neck: no JVD Endocrine:  No thryomegaly Vascular: No carotid bruits; FA pulses 2+ bilaterally without bruits  Cardiac:  RRR; 1/6 SN, no  gallops or rubs Lungs:  CTA b/l, no wheezing, rhonchi or rales  Abd: soft, nontender Ext: no edema Musculoskeletal:  No deformities, advanced atrophy Skin: warm and dry  Neuro: no focal abnormalities noted Psych:  Normal affect   EKG:  The EKG was personally reviewed and demonstrates:    ST 102bpm, no ischemic looking changes  Telemetry:  Telemetry was personally reviewed and demonstrates:   SR 890's  Relevant CV Studies:   09/19/20; TTE IMPRESSIONS   1. Left ventricular ejection fraction, by estimation, is 65 to 70%. The  left ventricle has normal function. The left ventricle has no regional  wall motion abnormalities. There is mild concentric left ventricular  hypertrophy. Left ventricular diastolic  parameters are consistent with Grade II diastolic dysfunction  (pseudonormalization). Elevated left ventricular end-diastolic pressure.   2. Right ventricular systolic function is normal. The right ventricular  size is normal. There is normal pulmonary artery systolic pressure.   3. The mitral valve is normal in structure. Trivial mitral valve  regurgitation. No evidence of mitral stenosis.   4. The aortic valve is tricuspid. There is mild calcification of the  aortic valve. There is mild thickening of the aortic valve. Aortic valve  regurgitation is mild. Mild aortic valve  stenosis. Aortic regurgitation  PHT measures 430 msec. Aortic valve  area, by VTI measures 1.22 cm. Aortic valve mean gradient measures 10.0  mmHg. Aortic valve Vmax measures 2.16 m/s.   5. The inferior vena cava is normal in size with greater than 50%  respiratory variability, suggesting right atrial pressure of 3 mmHg.   Laboratory Data:  High Sensitivity Troponin:  No results for input(s): TROPONINIHS in the last 720 hours.   ChemistryNo results for input(s): NA, K, CL, CO2, GLUCOSE, BUN, CREATININE, CALCIUM, GFRNONAA, GFRAA, ANIONGAP in the last 168 hours.  No results for input(s): PROT, ALBUMIN, AST, ALT, ALKPHOS, BILITOT in the last 168 hours. HematologyNo results for input(s): WBC, RBC, HGB, HCT, MCV, MCH, MCHC, RDW, PLT in the last 168 hours. BNPNo results for input(s): BNP, PROBNP in the last 168 hours.  DDimer No results for input(s): DDIMER in the last 168 hours.   Radiology/Studies:  No results found.   Assessment and Plan:   Paroxysmal Afib CHA2DS2Vasc is 4, on eliquis, appropriately dosed She has had spontaneous return of SR  She c/o generally tired, though feeling better back in SR  She has not had any CP, EKG is non-ischemic   Dr. Rayann Heman has seen and examined the patient we have reviewed labs  Recommend increasing her flecainide to 75mg  BID And agrees with the metoprolol 100mg  daily I have sent both to her requested pharmacy ([facility])  OK to discharge from EP perspective Early follow up in the AFib clniic for Friday 8/5 is in place, patient is aware      Risk Assessment/Risk Scores:     For questions or updates, please contact [facility] Please consult [email or web address] for contact info under    Signed, Baldwin Jamaica, PA-C  01/23/2021 3:13 PM   I have seen, examined the patient, and reviewed the above assessment and plan.  Changes to above are made where necessary.  On exam, RRR.  The patient presented initially with rapidly conducting afib  and also a 1:1 tachycardia, now resolved.  She is feeling "much better" and wants to go home.  Increase metoprolol to 100mg  daily Increase flecainide to 75mg  BID.  DC to home with close follow-up in the AF clinic.  Co Sign: Thompson Grayer, MD 01/23/2021 5:44 PM

## 2021-01-23 NOTE — Telephone Encounter (Signed)
Patient would like new prescription of metoprolol 100 mg daily sent to [facility] instead of [facility]. Attempted to call and they are closed.   Placing in triage basket so triage RN can follow up.   [facility] [phone]

## 2021-01-23 NOTE — Assessment & Plan Note (Signed)
Stable. May need PPM interrogated to see how long pt was in rapid afib over the last 3 days.

## 2021-01-24 DIAGNOSIS — J449 Chronic obstructive pulmonary disease, unspecified: Secondary | ICD-10-CM | POA: Diagnosis not present

## 2021-01-24 DIAGNOSIS — I10 Essential (primary) hypertension: Secondary | ICD-10-CM

## 2021-01-24 DIAGNOSIS — Z950 Presence of cardiac pacemaker: Secondary | ICD-10-CM | POA: Diagnosis not present

## 2021-01-24 DIAGNOSIS — R000 Tachycardia, unspecified: Secondary | ICD-10-CM | POA: Diagnosis not present

## 2021-01-24 DIAGNOSIS — E039 Hypothyroidism, unspecified: Secondary | ICD-10-CM | POA: Diagnosis not present

## 2021-01-24 DIAGNOSIS — I480 Paroxysmal atrial fibrillation: Secondary | ICD-10-CM

## 2021-01-24 DIAGNOSIS — N179 Acute kidney failure, unspecified: Secondary | ICD-10-CM | POA: Diagnosis not present

## 2021-01-24 LAB — CBC WITH DIFFERENTIAL/PLATELET
Abs Immature Granulocytes: 0.05 10*3/uL (ref 0.00–0.07)
Basophils Absolute: 0.1 10*3/uL (ref 0.0–0.1)
Basophils Relative: 1 %
Eosinophils Absolute: 0.2 10*3/uL (ref 0.0–0.5)
Eosinophils Relative: 3 %
HCT: 31.2 % — ABNORMAL LOW (ref 36.0–46.0)
Hemoglobin: 10 g/dL — ABNORMAL LOW (ref 12.0–15.0)
Immature Granulocytes: 1 %
Lymphocytes Relative: 16 %
Lymphs Abs: 1.3 10*3/uL (ref 0.7–4.0)
MCH: 29.4 pg (ref 26.0–34.0)
MCHC: 32.1 g/dL (ref 30.0–36.0)
MCV: 91.8 fL (ref 80.0–100.0)
Monocytes Absolute: 1 10*3/uL (ref 0.1–1.0)
Monocytes Relative: 13 %
Neutro Abs: 5.5 10*3/uL (ref 1.7–7.7)
Neutrophils Relative %: 66 %
Platelets: 225 10*3/uL (ref 150–400)
RBC: 3.4 MIL/uL — ABNORMAL LOW (ref 3.87–5.11)
RDW: 14.9 % (ref 11.5–15.5)
WBC: 8.1 10*3/uL (ref 4.0–10.5)
nRBC: 0 % (ref 0.0–0.2)

## 2021-01-24 LAB — COMPREHENSIVE METABOLIC PANEL
ALT: 12 U/L (ref 0–44)
AST: 13 U/L — ABNORMAL LOW (ref 15–41)
Albumin: 2.1 g/dL — ABNORMAL LOW (ref 3.5–5.0)
Alkaline Phosphatase: 75 U/L (ref 38–126)
Anion gap: 10 (ref 5–15)
BUN: 36 mg/dL — ABNORMAL HIGH (ref 8–23)
CO2: 20 mmol/L — ABNORMAL LOW (ref 22–32)
Calcium: 8.4 mg/dL — ABNORMAL LOW (ref 8.9–10.3)
Chloride: 108 mmol/L (ref 98–111)
Creatinine, Ser: 1.98 mg/dL — ABNORMAL HIGH (ref 0.44–1.00)
GFR, Estimated: 25 mL/min — ABNORMAL LOW (ref >60)
Glucose, Bld: 105 mg/dL — ABNORMAL HIGH (ref 70–99)
Potassium: 3.5 mmol/L (ref 3.5–5.1)
Sodium: 138 mmol/L (ref 135–145)
Total Bilirubin: 0.8 mg/dL (ref 0.3–1.2)
Total Protein: 5.1 g/dL — ABNORMAL LOW (ref 6.5–8.1)

## 2021-01-24 LAB — URINALYSIS, ROUTINE W REFLEX MICROSCOPIC
Bilirubin Urine: NEGATIVE
Glucose, UA: NEGATIVE mg/dL
Ketones, ur: NEGATIVE mg/dL
Nitrite: NEGATIVE
Protein, ur: NEGATIVE mg/dL
Specific Gravity, Urine: 1.009 (ref 1.005–1.030)
WBC, UA: >50 WBC/hpf — ABNORMAL HIGH (ref 0–5)
pH: 6 (ref 5.0–8.0)

## 2021-01-24 LAB — TSH: TSH: 19.808 u[IU]/mL — ABNORMAL HIGH (ref 0.350–4.500)

## 2021-01-24 LAB — MAGNESIUM: Magnesium: 1.9 mg/dL (ref 1.7–2.4)

## 2021-01-24 MED ORDER — ONDANSETRON HCL 4 MG PO TABS: 4.0000 mg | ORAL_TABLET | Freq: Four times a day (QID) | ORAL | Status: DC | PRN

## 2021-01-24 MED ORDER — LORAZEPAM 1 MG PO TABS: 0.5000 mg | ORAL_TABLET | Freq: Every evening | ORAL | Status: DC | PRN

## 2021-01-24 MED ORDER — ONDANSETRON HCL 4 MG/2ML IJ SOLN: 4.0000 mg | Freq: Four times a day (QID) | INTRAMUSCULAR | Status: DC | PRN

## 2021-01-24 MED ORDER — METOPROLOL SUCCINATE ER 100 MG PO TB24
100.0000 mg | ORAL_TABLET | Freq: Every day | ORAL | Status: DC
  Administered 2021-01-24: 100 mg via ORAL
  Filled 2021-01-24: qty 1

## 2021-01-24 MED ORDER — ACETAMINOPHEN 650 MG RE SUPP: 650.0000 mg | Freq: Four times a day (QID) | RECTAL | Status: DC | PRN

## 2021-01-24 MED ORDER — ACETAMINOPHEN 325 MG PO TABS: 650.0000 mg | ORAL_TABLET | Freq: Four times a day (QID) | ORAL | Status: DC | PRN

## 2021-01-24 MED ORDER — SODIUM CHLORIDE 0.9 % IV SOLN: INTRAVENOUS | Status: DC

## 2021-01-24 MED ORDER — APIXABAN 2.5 MG PO TABS
2.5000 mg | ORAL_TABLET | Freq: Two times a day (BID) | ORAL | Status: DC
  Administered 2021-01-24: 2.5 mg via ORAL
  Filled 2021-01-24: qty 1

## 2021-01-24 MED ORDER — POTASSIUM CHLORIDE CRYS ER 20 MEQ PO TBCR
20.0000 meq | EXTENDED_RELEASE_TABLET | Freq: Once | ORAL | Status: AC
  Administered 2021-01-24: 20 meq via ORAL
  Filled 2021-01-24: qty 1

## 2021-01-24 MED ORDER — LEVOTHYROXINE SODIUM 75 MCG PO TABS
75.0000 ug | ORAL_TABLET | Freq: Every day | ORAL | Status: DC
  Administered 2021-01-24: 75 ug via ORAL
  Filled 2021-01-24: qty 1

## 2021-01-24 MED ORDER — FLECAINIDE ACETATE 50 MG PO TABS
50.0000 mg | ORAL_TABLET | Freq: Two times a day (BID) | ORAL | Status: DC
  Administered 2021-01-24: 50 mg via ORAL
  Filled 2021-01-24 (×2): qty 1

## 2021-01-24 NOTE — ED Notes (Signed)
Introduced myself to pt. Pt resting comfortably in the bed. Denies needs.

## 2021-01-24 NOTE — Evaluation (Signed)
Physical Therapy Evaluation Patient Details Name: Deborah Shields MRN: [id number] DOB: 11/17/1940 Today's Date: 01/24/2021   History of Present Illness  Pt is an 80 y/o female admitted secondary to dizziness and poor intake. Found to have AKI. PMH includes HTN, a fib, COPD, and s/p pacemaker.  Clinical Impression  Pt admitted secondary to problem above with deficits below. Pt requiring min guard A for mobility tasks using RW. Mild unsteadiness and weakness noted. Educated about having family/neighbor stay with her initially at d/c to ensure safety at home. Recommending HHPT at d/c. Will continue to follow acutely.     Follow Up Recommendations Home health PT;Supervision/Assistance - 24 hour (24/7 initially)    Equipment Recommendations  None recommended by PT    Recommendations for Other Services       Precautions / Restrictions Precautions Precautions: Fall Restrictions Weight Bearing Restrictions: No      Mobility  Bed Mobility Overal bed mobility: Needs Assistance Bed Mobility: Supine to Sit;Sit to Supine     Supine to sit: Min guard Sit to supine: Min guard   General bed mobility comments: Min guard for safety    Transfers Overall transfer level: Needs assistance Equipment used: Rolling walker (2 wheeled) Transfers: Sit to/from Stand Sit to Stand: Min guard         General transfer comment: Min guard for safety to stand from higher stretcher height.  Ambulation/Gait Ambulation/Gait assistance: Min guard;Supervision Gait Distance (Feet): 125 Feet Assistive device: Rolling walker (2 wheeled) Gait Pattern/deviations: Step-through pattern;Decreased stride length Gait velocity: Decreased   General Gait Details: Mild unsteadiness noted, but no LOB. Min guard to supervision for safety.  Stairs            Wheelchair Mobility    Modified Rankin (Stroke Patients Only)       Balance Overall balance assessment: Mild deficits observed, not formally  tested                                           Pertinent Vitals/Pain Pain Assessment: No/denies pain    Home Living Family/patient expects to be discharged to:: Private residence Living Arrangements: Alone Available Help at Discharge: Neighbor;Available 24 hours/day;Family Type of Home: House Home Access: Stairs to enter Entrance Stairs-Rails: Left Entrance Stairs-Number of Steps: 2 Home Layout: One level Home Equipment: Walker - 4 wheels;Shower seat;Bedside commode      Prior Function Level of Independence: Independent with assistive device(s)   Gait / Transfers Assistance Needed: Able to ambulate at a mod I level with rollator.  ADL's / Homemaking Assistance Needed: Reports daughter assist with shower transfers and bathing if needed.        Hand Dominance   Dominant Hand: Right    Extremity/Trunk Assessment   Upper Extremity Assessment Upper Extremity Assessment: Generalized weakness    Lower Extremity Assessment Lower Extremity Assessment: Generalized weakness    Cervical / Trunk Assessment Cervical / Trunk Assessment: Kyphotic  Communication   Communication: No difficulties  Cognition Arousal/Alertness: Awake/alert Behavior During Therapy: WFL for tasks assessed/performed Overall Cognitive Status: Within Functional Limits for tasks assessed                                        General Comments General comments (skin integrity, edema, etc.): Educated about having family/neighbor  stay with her at d/c.    Exercises     Assessment/Plan    PT Assessment Patient needs continued PT services  PT Problem List Decreased strength;Decreased balance;Decreased activity tolerance;Decreased mobility;Decreased knowledge of use of DME;Decreased knowledge of precautions       PT Treatment Interventions DME instruction;Gait training;Functional mobility training;Stair training;Therapeutic exercise;Therapeutic activities;Balance  training;Patient/family education    PT Goals (Current goals can be found in the Care Plan section)  Acute Rehab PT Goals Patient Stated Goal: to go home PT Goal Formulation: With patient Time For Goal Achievement: 02/07/21 Potential to Achieve Goals: Good    Frequency Min 3X/week   Barriers to discharge        Co-evaluation               AM-PAC PT "6 Clicks" Mobility  Outcome Measure Help needed turning from your back to your side while in a flat bed without using bedrails?: A Little Help needed moving from lying on your back to sitting on the side of a flat bed without using bedrails?: A Little Help needed moving to and from a bed to a chair (including a wheelchair)?: A Little Help needed standing up from a chair using your arms (e.g., wheelchair or bedside chair)?: A Little Help needed to walk in hospital room?: A Little Help needed climbing 3-5 steps with a railing? : A Lot 6 Click Score: 17    End of Session Equipment Utilized During Treatment: Gait belt Activity Tolerance: Patient tolerated treatment well Patient left: in bed;with call bell/phone within reach (on stretcher in ED) Nurse Communication: Mobility status PT Visit Diagnosis: Unsteadiness on feet (R26.81);Muscle weakness (generalized) (M62.81)    Time: 7425-9563 PT Time Calculation (min) (ACUTE ONLY): 14 min   Charges:   PT Evaluation $PT Eval Low Complexity: 1 Low          Lou Miner, DPT  Acute Rehabilitation Services  Pager: [phone] Office: [phone]   Rudean Hitt 01/24/2021, 10:00 AM

## 2021-01-24 NOTE — Telephone Encounter (Signed)
Spoke with [place] at [facility]. Confirmed receipt of prescription for Metoprolol 100mg  QD. Patrina states medications will be delivered to patient today.

## 2021-01-24 NOTE — ED Notes (Signed)
RN spoke to daughter Lynelle Smoke on the phone, gave update on POC

## 2021-01-24 NOTE — ED Notes (Addendum)
Pt resting in stretcher, call light in reach, VSS, no distress noted. Delay in d/c r/t SW/CM need to set up home health PT. RN has spoken to Daughter in [place] who will be able to pick pt up and stay with her p d/c. MD aware. Awaiting HH PT arrangements for d/c.

## 2021-01-24 NOTE — Discharge Planning (Signed)
Pt currently active with [facility] for [place] services as confirmed by RNCM with Chrissie Noa of [facility].  Pt will resume [place] services of RN.

## 2021-01-24 NOTE — ED Notes (Signed)
RN clarified D/C orders c Dr Bonner Puna. MD to round on pt but reports AKI likely resolved d/t reverting to sinus rhythm, continue with d/c after he sees pt. RN to obtain AM labs.

## 2021-01-24 NOTE — Progress Notes (Signed)
Pt reported having [facility] services before but not sure of agency name . Pt reported she does not need any DME. CSW consulted [facility] to assist with setting up [facility] PT services.   Arlie Solomons.Tell [name], MSW, [place]  Transitions of Care Clinical Social Worker I Direct Dial: [phone]  Fax: [phone] [email or web address]

## 2021-01-24 NOTE — ED Notes (Signed)
Pt verbalized understanding of d/c instructions, meds and followup care. Denies questions. VSS, no distress noted. W/C to [employer] vehicle to transport home. RN spoke to Judson Roch and updated on d/c and POC prior to d/c.

## 2021-01-24 NOTE — Discharge Summary (Signed)
Physician Discharge Summary  Deborah Shields STM:[id number] DOB: May 02, 1941 DOA: 01/23/2021  PCP: Lajean Manes, MD  Admit date: 01/23/2021 Discharge date: 01/24/2021  Admitted From: Home Disposition: Home   Recommendations for Outpatient Follow-up:  Follow up with AFib clinic scheduled 8/5 at 9:00am.  Suggest recheck BMP (for AKI) and CBC (for anemia w/hgb 10g/dl).  Follow up TSH (pending at discharge).  Home Health: PT Equipment/Devices: None new Discharge Condition: Stable CODE STATUS: Full Diet recommendation: Heart healthy  Brief/Interim Summary: Deborah Shields is an 80 y.o. female with a history of PAF on metoprolol, flecainide, eliquis, tachy/brady syndrome with post-termination pauses necessitating PPM, HTN, HLD, carotid stenosis due to fibromuscular dysplasia, stage IIIb CKD who was referred to the ED by device clinic due to ongoing AFib with RVR associated with generalized fatigue and weakness, decreased appetite and exertional capacity to cook for herself. In the ED she converted to NSR spontaneously with improvement in symptoms. Cardiology recommended increasing flecainide and metoprolol, sent these prescriptions to her pharmacy and arranged follow up on Friday. Though they cleared her for discharge, she was kept overnight due to AKI which has improved. She is tolerating a full breakfast, has arranged for multiple family members in the area to check in on her, for a caregiver to stay with her regularly, and will be able to follow up in < 48 hours in AFib clinic.   Discharge Diagnoses:  Principal Problem:   AKI (acute kidney injury) ([place]) Active Problems:   Hypothyroidism (acquired)   Essential hypertension   PAF (paroxysmal atrial fibrillation) ([facility])   COPD with chronic bronchitis and emphysema ([place])   Pacemaker  AFib with RVR, PAF: Improved, now in NSR. - Increase flecainide and metoprolol as prescribed by cardiology/EP.  - Follow up 8/5 at 9:00am  AKI on stage  IIIb CKD: Due to impaired renal perfusion and some element of dehydration, though SCr improved when checked only 90 minutes after IV fluids were started suggesting dehydration was relatively modest contributor to AKI. Pt tolerating adequate po and no longer requires IVF.  - Suggest recheck BMP at follow up.   Generalized weakness: Improved now that in NSR. Will arrange home health PT. Pt has arranged significant support at home, has many family members nearby. Feels safe going home.   Anemia of CKD: With some IVF, revealed hgb 10g/dl. No bleeding. Ok to continue low dose eliquis with bleeding precautions reviewed.   All chronic medical conditions are stable with no changes in management.   Demand myocardial ischemia: Troponin modestly elevated without ischemic ECG, due to elevated rate. No further work up recommended by cardiology at this time.   Discharge Instructions Discharge Instructions     Diet - low sodium heart healthy   Complete by: As directed    Discharge instructions   Complete by: As directed    Increase the dose of metoprolol to 100mg  and increase the dose of flecainide to 75mg . These new prescriptions were sent to your pharmacy.   You have a clinic follow up appointment scheduled in 2 days, at 9:00am on Friday with a provider. It is very important that you go to this appointment and/or seek medical attention right away if your symptoms come back.      Allergies as of 01/24/2021       Reactions   Aspirin Nausea Only, Other (See Comments)   REACTION: GI upset  ( pt can take 81 mg but NOT   325 mg ASA )   Lovastatin Nausea  Only, Other (See Comments)   REACTION: nausea   Benazepril Hcl Other (See Comments)   Unknown   Paroxetine Other (See Comments)   Unknown   Tape Other (See Comments)   Valacyclovir Hcl Nausea And Vomiting   Bupropion Hcl Other (See Comments)   REACTION: tinnitis   Ezetimibe Nausea And Vomiting, Other (See Comments)   REACTION: GI symptoms    Fenofibrate Other (See Comments)   Myalgia   Pravastatin Sodium Other (See Comments)   REACTION: elevated CPK - Muscle's in bilateral Leg        Medication List     TAKE these medications    apixaban 2.5 MG Tabs tablet Commonly known as: ELIQUIS Take 1 tablet (2.5 mg total) by mouth 2 (two) times daily.   b complex vitamins tablet Take 1 tablet by mouth daily after supper.   cholecalciferol 25 MCG (1000 UNIT) tablet Commonly known as: VITAMIN D3 Take 1,000 Units by mouth daily.   cyclobenzaprine 10 MG tablet Commonly known as: FLEXERIL Take 10 mg by mouth 2 (two) times daily as needed for muscle spasms (scheduled every every).   diclofenac sodium 1 % Gel Commonly known as: VOLTAREN Apply 2-4 g topically 4 (four) times daily as needed (for pain).   flecainide 50 MG tablet Commonly known as: TAMBOCOR Take 1.5 tablets (75 mg total) by mouth 2 (two) times daily. TAKE 1 TABLET(50 MG) BY MOUTH EVERY 12 HOURS What changed:  how much to take how to take this when to take this   hydrALAZINE 25 MG tablet Commonly known as: APRESOLINE Take 1 tablet (25 mg total) by mouth every 8 (eight) hours as needed (if systolic bp more than 343 mmhg).   levothyroxine 75 MCG tablet Commonly known as: SYNTHROID Take 1 tablet (75 mcg total) by mouth daily before breakfast. Taking the brand "synthroid"   LORazepam 0.5 MG tablet Commonly known as: ATIVAN Take 1 tablet (0.5 mg total) by mouth 3 (three) times daily as needed. What changed: when to take this   meclizine 25 MG tablet Commonly known as: ANTIVERT Take 1 tablet (25 mg total) by mouth daily. 1/2 every 8 hrs prn for imbalance What changed:  how much to take additional instructions   metoprolol succinate 100 MG 24 hr tablet Commonly known as: TOPROL-XL Take 1 tablet (100 mg total) by mouth daily. Take with or immediately following a meal.   senna 8.6 MG tablet Commonly known as: SENOKOT Take 1 tablet by mouth every other  day.   traMADol 50 MG tablet Commonly known as: ULTRAM 1/2-1 by mouth once daily as needed. What changed:  how much to take how to take this when to take this reasons to take this additional instructions        Follow-up Information     [facility].   Specialty: Cardiology Why: 01/26/21 @ 9:00AM with R. Fenton, PA-C Contact information: [address] [id number] mc [place] [address] [phone]               Allergies  Allergen Reactions   Aspirin Nausea Only and Other (See Comments)    REACTION: GI upset  ( pt can take 81 mg but NOT   325 mg ASA )   Lovastatin Nausea Only and Other (See Comments)    REACTION: nausea   Benazepril Hcl Other (See Comments)    Unknown   Paroxetine Other (See Comments)    Unknown   Tape Other (See  Comments)   Valacyclovir Hcl Nausea And Vomiting   Bupropion Hcl Other (See Comments)    REACTION: tinnitis   Ezetimibe Nausea And Vomiting and Other (See Comments)    REACTION: GI symptoms   Fenofibrate Other (See Comments)    Myalgia    Pravastatin Sodium Other (See Comments)    REACTION: elevated CPK - Muscle's in bilateral Leg    Consultations: Cardiology, Dr. Rayann Heman  Procedures/Studies: DG Chest 2 View  Result Date: 01/23/2021 CLINICAL DATA:  Weakness and dizziness EXAM: CHEST - 2 VIEW COMPARISON:  Chest x-ray dated September 18, 2020 FINDINGS: Cardiac and mediastinal contours are unchanged. Scattered bilateral linear opacities, similar to prior exam and likely due to scarring or atelectasis. No large pleural effusion or pneumothorax. Left chest wall dual lead pacer with unchanged lead positioning. IMPRESSION: No active cardiopulmonary disease. Electronically Signed   By: Yetta Glassman MD   On: 01/23/2021 15:26    Subjective: Eating breakfast, no chest pain or dyspnea. Ambulated to BR with help from RN. Wants to go home.   Discharge Exam: Vitals:   01/24/21 0600  01/24/21 0845  BP: (!) 167/90 (!) 175/94  Pulse: 87 94  Resp: (!) 23 17  Temp:  98 F (36.7 C)  SpO2: 100% 100%   General: Pt is alert, awake, not in acute distress Cardiovascular: RRR, S1/S2 +, no rubs, no gallops Respiratory: CTA bilaterally, no wheezing, no rhonchi Abdominal: Soft, NT, ND, bowel sounds + Extremities: No edema, no cyanosis  Labs: BNP (last 3 results) No results for input(s): BNP in the last 8760 hours. Basic Metabolic Panel: Recent Labs  Lab 01/23/21 1419 01/24/21 0435  NA 136 138  K 3.5 3.5  CL 106 108  CO2 20* 20*  GLUCOSE 95 105*  BUN 41* 36*  CREATININE 2.22* 1.98*  CALCIUM 8.7* 8.4*  MG  --  1.9   Liver Function Tests: Recent Labs  Lab 01/23/21 1419 01/24/21 0435  AST 17 13*  ALT 16 12  ALKPHOS 91 75  BILITOT 1.1 0.8  PROT 6.3* 5.1*  ALBUMIN 2.5* 2.1*   No results for input(s): LIPASE, AMYLASE in the last 168 hours. No results for input(s): AMMONIA in the last 168 hours. CBC: Recent Labs  Lab 01/23/21 1419 01/24/21 0435  WBC 10.3 8.1  NEUTROABS 7.9* 5.5  HGB 12.4 10.0*  HCT 37.6 31.2*  MCV 90.2 91.8  PLT 253 225   Cardiac Enzymes: No results for input(s): CKTOTAL, CKMB, CKMBINDEX, TROPONINI in the last 168 hours. BNP: Invalid input(s): POCBNP CBG: No results for input(s): GLUCAP in the last 168 hours. D-Dimer No results for input(s): DDIMER in the last 72 hours. Hgb A1c No results for input(s): HGBA1C in the last 72 hours. Lipid Profile No results for input(s): CHOL, HDL, LDLCALC, TRIG, CHOLHDL, LDLDIRECT in the last 72 hours. Thyroid function studies No results for input(s): TSH, T4TOTAL, T3FREE, THYROIDAB in the last 72 hours.  Invalid input(s): FREET3 Anemia work up No results for input(s): VITAMINB12, FOLATE, FERRITIN, TIBC, IRON, RETICCTPCT in the last 72 hours. Urinalysis    Component Value Date/Time   COLORURINE YELLOW 08/17/2018 0505   APPEARANCEUR CLEAR 08/17/2018 0505   LABSPEC 1.024 08/17/2018 0505    PHURINE 6.0 08/17/2018 0505   GLUCOSEU NEGATIVE 08/17/2018 0505   GLUCOSEU NEGATIVE 11/21/2015 1156   HGBUR MODERATE (A) 08/17/2018 0505   BILIRUBINUR NEGATIVE 08/17/2018 0505   BILIRUBINUR Neg 09/07/2012 1426   KETONESUR NEGATIVE 08/17/2018 0505   PROTEINUR NEGATIVE 08/17/2018 0505  UROBILINOGEN 0.2 11/21/2015 1156   NITRITE NEGATIVE 08/17/2018 0505   LEUKOCYTESUR NEGATIVE 08/17/2018 0505    Microbiology Recent Results (from the past 240 hour(s))  Resp Panel by RT-PCR (Flu A&B, Covid) Nasopharyngeal Swab     Status: None   Collection Time: 01/23/21  2:20 PM   Specimen: Nasopharyngeal Swab; Nasopharyngeal(NP) swabs in vial transport medium  Result Value Ref Range Status   SARS Coronavirus 2 by RT PCR NEGATIVE NEGATIVE Final    Comment: (NOTE) SARS-CoV-2 target nucleic acids are NOT DETECTED.  The SARS-CoV-2 RNA is generally detectable in upper respiratory specimens during the acute phase of infection. The lowest concentration of SARS-CoV-2 viral copies this assay can detect is 138 copies/mL. A negative result does not preclude SARS-Cov-2 infection and should not be used as the sole basis for treatment or other patient management decisions. A negative result may occur with  improper specimen collection/handling, submission of specimen other than nasopharyngeal swab, presence of viral mutation(s) within the areas targeted by this assay, and inadequate number of viral copies(<138 copies/mL). A negative result must be combined with clinical observations, patient history, and epidemiological information. The expected result is Negative.  Fact Sheet for Patients:  EntrepreneurPulse.com.au  Fact Sheet for Healthcare Providers:  IncredibleEmployment.be  This test is no t yet approved or cleared by the [place] FDA and  has been authorized for detection and/or diagnosis of SARS-CoV-2 by FDA under an Emergency Use Authorization (EUA). This  EUA will remain  in effect (meaning this test can be used) for the duration of the COVID-19 declaration under Section 564(b)(1) of the Act, 21 U.S.C.section 360bbb-3(b)(1), unless the authorization is terminated  or revoked sooner.       Influenza A by PCR NEGATIVE NEGATIVE Final   Influenza B by PCR NEGATIVE NEGATIVE Final    Comment: (NOTE) The Xpert Xpress SARS-CoV-2/FLU/RSV plus assay is intended as an aid in the diagnosis of influenza from Nasopharyngeal swab specimens and should not be used as a sole basis for treatment. Nasal washings and aspirates are unacceptable for Xpert Xpress SARS-CoV-2/FLU/RSV testing.  Fact Sheet for Patients: EntrepreneurPulse.com.au  Fact Sheet for Healthcare Providers: IncredibleEmployment.be  This test is not yet approved or cleared by the [place] FDA and has been authorized for detection and/or diagnosis of SARS-CoV-2 by FDA under an Emergency Use Authorization (EUA). This EUA will remain in effect (meaning this test can be used) for the duration of the COVID-19 declaration under Section 564(b)(1) of the Act, 21 U.S.C. section 360bbb-3(b)(1), unless the authorization is terminated or revoked.  Performed at [facility], [place] [address], [place], [place] [address]     Time coordinating discharge: Approximately 40 minutes  Patrecia Pour, MD  [facility] 01/24/2021, 9:26 AM

## 2021-01-24 NOTE — ED Notes (Signed)
Secure message sent to floor coverage [facility] Dr Marlowe Sax requesting order for IVF. Pt admission for AKI. Per MD note pt to have IV hydration through the night. No order for fluid found. Pending response.

## 2021-01-24 NOTE — ED Notes (Signed)
RN assisted pt to bathroom, obtained urine sample. Labs obtained and sent. Pt given breakfast. Remains on IVF. Denies pain, denies needs. No distress noted. Call light in reach.

## 2021-01-26 ENCOUNTER — Ambulatory Visit (HOSPITAL_COMMUNITY): Payer: Medicare Other | Admitting: Physician Assistant

## 2021-01-26 ENCOUNTER — Encounter (HOSPITAL_COMMUNITY): Payer: Self-pay

## 2021-01-26 DIAGNOSIS — B0052 Herpesviral keratitis: Secondary | ICD-10-CM | POA: Diagnosis not present

## 2021-01-31 DIAGNOSIS — B0052 Herpesviral keratitis: Secondary | ICD-10-CM | POA: Diagnosis not present

## 2021-02-07 DIAGNOSIS — B0052 Herpesviral keratitis: Secondary | ICD-10-CM | POA: Diagnosis not present

## 2021-02-09 DIAGNOSIS — I129 Hypertensive chronic kidney disease with stage 1 through stage 4 chronic kidney disease, or unspecified chronic kidney disease: Secondary | ICD-10-CM | POA: Diagnosis not present

## 2021-02-09 DIAGNOSIS — I480 Paroxysmal atrial fibrillation: Secondary | ICD-10-CM | POA: Diagnosis not present

## 2021-02-09 DIAGNOSIS — N184 Chronic kidney disease, stage 4 (severe): Secondary | ICD-10-CM | POA: Diagnosis not present

## 2021-02-09 DIAGNOSIS — E7800 Pure hypercholesterolemia, unspecified: Secondary | ICD-10-CM | POA: Diagnosis not present

## 2021-02-09 DIAGNOSIS — E039 Hypothyroidism, unspecified: Secondary | ICD-10-CM | POA: Diagnosis not present

## 2021-02-09 DIAGNOSIS — J449 Chronic obstructive pulmonary disease, unspecified: Secondary | ICD-10-CM | POA: Diagnosis not present

## 2021-02-14 DIAGNOSIS — B0052 Herpesviral keratitis: Secondary | ICD-10-CM | POA: Diagnosis not present

## 2021-02-15 ENCOUNTER — Other Ambulatory Visit: Payer: Self-pay | Admitting: Cardiovascular Disease

## 2021-02-15 NOTE — Telephone Encounter (Signed)
Prescription refill request for Eliquis received. Indication:afib Last office visit:12/06/19 w/croitoru (OVERDUE) Scr:1.98 01/24/21 Age: 89f Weight:45.4k

## 2021-02-19 ENCOUNTER — Encounter: Payer: Self-pay | Admitting: Cardiovascular Disease

## 2021-02-19 NOTE — Telephone Encounter (Signed)
error 

## 2021-02-23 DIAGNOSIS — B0052 Herpesviral keratitis: Secondary | ICD-10-CM | POA: Diagnosis not present

## 2021-03-01 ENCOUNTER — Other Ambulatory Visit: Payer: Self-pay | Admitting: Cardiovascular Disease

## 2021-03-01 NOTE — Telephone Encounter (Signed)
D/C 01/23/21

## 2021-03-05 ENCOUNTER — Ambulatory Visit (INDEPENDENT_AMBULATORY_CARE_PROVIDER_SITE_OTHER): Payer: Medicare Other

## 2021-03-05 DIAGNOSIS — I495 Sick sinus syndrome: Secondary | ICD-10-CM | POA: Diagnosis not present

## 2021-03-05 DIAGNOSIS — J449 Chronic obstructive pulmonary disease, unspecified: Secondary | ICD-10-CM | POA: Diagnosis not present

## 2021-03-05 DIAGNOSIS — E039 Hypothyroidism, unspecified: Secondary | ICD-10-CM | POA: Diagnosis not present

## 2021-03-05 DIAGNOSIS — I129 Hypertensive chronic kidney disease with stage 1 through stage 4 chronic kidney disease, or unspecified chronic kidney disease: Secondary | ICD-10-CM | POA: Diagnosis not present

## 2021-03-05 DIAGNOSIS — Z1389 Encounter for screening for other disorder: Secondary | ICD-10-CM | POA: Diagnosis not present

## 2021-03-05 DIAGNOSIS — Z0000 Encounter for general adult medical examination without abnormal findings: Secondary | ICD-10-CM | POA: Diagnosis not present

## 2021-03-05 DIAGNOSIS — I700 Atherosclerosis of aorta: Secondary | ICD-10-CM | POA: Diagnosis not present

## 2021-03-05 DIAGNOSIS — Z950 Presence of cardiac pacemaker: Secondary | ICD-10-CM | POA: Diagnosis not present

## 2021-03-05 DIAGNOSIS — I480 Paroxysmal atrial fibrillation: Secondary | ICD-10-CM | POA: Diagnosis not present

## 2021-03-05 DIAGNOSIS — E7800 Pure hypercholesterolemia, unspecified: Secondary | ICD-10-CM | POA: Diagnosis not present

## 2021-03-05 DIAGNOSIS — I70209 Unspecified atherosclerosis of native arteries of extremities, unspecified extremity: Secondary | ICD-10-CM | POA: Diagnosis not present

## 2021-03-05 DIAGNOSIS — E440 Moderate protein-calorie malnutrition: Secondary | ICD-10-CM | POA: Diagnosis not present

## 2021-03-05 DIAGNOSIS — N184 Chronic kidney disease, stage 4 (severe): Secondary | ICD-10-CM | POA: Diagnosis not present

## 2021-03-06 LAB — CUP PACEART REMOTE DEVICE CHECK
Battery Remaining Longevity: 100 mo
Battery Remaining Percentage: 89 %
Battery Voltage: 3.01 V
Brady Statistic AP VP Percent: 1 %
Brady Statistic AP VS Percent: 49 %
Brady Statistic AS VP Percent: 1 %
Brady Statistic AS VS Percent: 50 %
Brady Statistic RA Percent Paced: 49 %
Brady Statistic RV Percent Paced: 1 %
Date Time Interrogation Session: 20220912053322
Implantable Lead Implant Date: 20210315
Implantable Lead Implant Date: 20210315
Implantable Lead Location: 753859
Implantable Lead Location: 753860
Implantable Pulse Generator Implant Date: 20210315
Lead Channel Impedance Value: 440 Ohm
Lead Channel Impedance Value: 560 Ohm
Lead Channel Pacing Threshold Amplitude: 0.75 V
Lead Channel Pacing Threshold Amplitude: 1 V
Lead Channel Pacing Threshold Pulse Width: 0.5 ms
Lead Channel Pacing Threshold Pulse Width: 0.5 ms
Lead Channel Sensing Intrinsic Amplitude: 3.3 mV
Lead Channel Sensing Intrinsic Amplitude: 6 mV
Lead Channel Setting Pacing Amplitude: 2.5 V
Lead Channel Setting Pacing Amplitude: 2.5 V
Lead Channel Setting Pacing Pulse Width: 0.5 ms
Lead Channel Setting Sensing Sensitivity: 2 mV
Pulse Gen Model: 2272
Pulse Gen Serial Number: 3805113

## 2021-03-10 ENCOUNTER — Encounter (HOSPITAL_COMMUNITY): Payer: Self-pay | Admitting: Emergency Medicine

## 2021-03-10 ENCOUNTER — Emergency Department (HOSPITAL_COMMUNITY)
Admission: EM | Admit: 2021-03-10 | Discharge: 2021-03-10 | Disposition: A | Discharge status: Home / Self Care | Payer: Medicare Other | Source: Home / Self Care | Attending: Emergency Medicine | Admitting: Emergency Medicine

## 2021-03-10 ENCOUNTER — Emergency Department (HOSPITAL_COMMUNITY): Payer: Medicare Other

## 2021-03-10 ENCOUNTER — Other Ambulatory Visit: Payer: Self-pay

## 2021-03-10 DIAGNOSIS — Z043 Encounter for examination and observation following other accident: Secondary | ICD-10-CM | POA: Diagnosis not present

## 2021-03-10 DIAGNOSIS — Z7901 Long term (current) use of anticoagulants: Secondary | ICD-10-CM | POA: Insufficient documentation

## 2021-03-10 DIAGNOSIS — W01198A Fall on same level from slipping, tripping and stumbling with subsequent striking against other object, initial encounter: Secondary | ICD-10-CM | POA: Insufficient documentation

## 2021-03-10 DIAGNOSIS — I10 Essential (primary) hypertension: Secondary | ICD-10-CM | POA: Diagnosis not present

## 2021-03-10 DIAGNOSIS — Z950 Presence of cardiac pacemaker: Secondary | ICD-10-CM | POA: Insufficient documentation

## 2021-03-10 DIAGNOSIS — W19XXXA Unspecified fall, initial encounter: Secondary | ICD-10-CM

## 2021-03-10 DIAGNOSIS — N183 Chronic kidney disease, stage 3 unspecified: Secondary | ICD-10-CM | POA: Insufficient documentation

## 2021-03-10 DIAGNOSIS — S79922A Unspecified injury of left thigh, initial encounter: Secondary | ICD-10-CM | POA: Diagnosis not present

## 2021-03-10 DIAGNOSIS — I129 Hypertensive chronic kidney disease with stage 1 through stage 4 chronic kidney disease, or unspecified chronic kidney disease: Secondary | ICD-10-CM | POA: Insufficient documentation

## 2021-03-10 DIAGNOSIS — S7002XA Contusion of left hip, initial encounter: Secondary | ICD-10-CM | POA: Insufficient documentation

## 2021-03-10 DIAGNOSIS — Y92512 Supermarket, store or market as the place of occurrence of the external cause: Secondary | ICD-10-CM | POA: Insufficient documentation

## 2021-03-10 DIAGNOSIS — J449 Chronic obstructive pulmonary disease, unspecified: Secondary | ICD-10-CM | POA: Insufficient documentation

## 2021-03-10 DIAGNOSIS — Z79899 Other long term (current) drug therapy: Secondary | ICD-10-CM | POA: Insufficient documentation

## 2021-03-10 DIAGNOSIS — E039 Hypothyroidism, unspecified: Secondary | ICD-10-CM | POA: Insufficient documentation

## 2021-03-10 DIAGNOSIS — Z85828 Personal history of other malignant neoplasm of skin: Secondary | ICD-10-CM | POA: Insufficient documentation

## 2021-03-10 DIAGNOSIS — M25552 Pain in left hip: Secondary | ICD-10-CM | POA: Diagnosis not present

## 2021-03-10 DIAGNOSIS — S79912A Unspecified injury of left hip, initial encounter: Secondary | ICD-10-CM | POA: Diagnosis not present

## 2021-03-10 MED ORDER — ACETAMINOPHEN 500 MG PO TABS
1000.0000 mg | ORAL_TABLET | Freq: Once | ORAL | Status: DC
  Filled 2021-03-10: qty 2

## 2021-03-10 NOTE — Discharge Instructions (Addendum)
Da was seen in the ER today for her hip pain after fall.  Her physical exam and x-rays were very reassuring.  There are no broken bones.  Given that she was able to ambulate in the emergency department with her walker, there is no indication for admission to the hospital or boarding in the ER for SNF placement.  She has been referred for outpatient therapies in the home including physical therapy and nursing in the home.  She is also been referred to our social work team who will contact you regarding your concerns.  Return to ER if develops any new numbness, ting, weakness in the leg, recurrent falls, or any other severe symptom.  Please follow-up with your primary care doctor in the next week.

## 2021-03-10 NOTE — ED Provider Notes (Signed)
[place] DEPT Provider Note   CSN: [id number] Arrival date & time: 03/10/21  1408     History Chief Complaint  Patient presents with   Hip Pain    Deborah Shields is a 80 y.o. female who presents with concern for left hip pain after mechanical fall yesterday.  States that she was grocery shopping when she bent over to pick something up off the bottom shelf and she fell backwards onto her buttocks.  Did not roll back in her head.  Denies any LOC, nausea, vomiting, blurry or double vision since that time.  States that she was assisted to her feet by bystanders and was able to complete her shopping trip and drive home.  Since today with her daughter at the bedside for concern for left leg pain and difficulty ambulating today secondary to leg pain.  I personally read this patient's medical records.  She is history of hypothyroidism, hyperlipidemia, vascular disease, aortic insufficiency, and anxiety.  She is anticoagulated on Eliquis.  HPI     Past Medical History:  Diagnosis Date   Anxiety    Aortic insufficiency    mild due to degenerative changes   Arthritis    Basal cell carcinoma 2007   [place] [facility] Left leg   Carotid artery occlusion    Chronic kidney disease    CRD Stage 3   Conjunctivitis due to adenovirus, both eyes    Degenerative joint disease    Diverticulosis of colon    Dyspnea    with exertion   Heart murmur    Hyperlipidemia    NMR 07/2009: LDL 200 (2260/1233)TG 99, HDL 65. LDL goal =<120, ideally <100. father MI @ 50   Hypertension    Hypothyroidism    Dr Wilson Singer   Microscopic hematuria    Peripheral neuropathy    compressive in UE bilaterally; Dr Daylene Katayama   Peripheral vascular disease ([place])    ICA bilat, Dr.Charles fields, [facility]   Pneumonia    2017   PONV (postoperative nausea and vomiting)    "Inner ear, does  okay with Scopolamine"   Pulmonary embolus ([place])    Rectocele    [place] spotted fever      Patient Active Problem List   Diagnosis Date Noted   AKI (acute kidney injury) ([place]) 01/23/2021   Hypertension, accelerated with heart disease, without CHF 09/19/2020   Atypical chest pain 09/18/2020   SSS (sick sinus syndrome) ([place]) 12/06/2019   Pacemaker 12/06/2019   Hypercholesterolemia 12/06/2019   Underweight 12/06/2019   Pneumonia of right upper lobe due to methicillin resistant Staphylococcus aureus (MRSA) ([place])    Pneumonia due to infectious organism    Community acquired pneumonia 08/16/2018   Malnutrition of moderate degree 07/16/2017   Hemoptysis 07/15/2017   HNP (herniated nucleus pulposus), lumbar 03/20/2017   Edema of both feet 12/10/2016   Tick bite 11/21/2015   Abnormal urine odor 11/21/2015   Other fatigue 11/21/2015   Anxiety 08/15/2015   Encounter for pre-operative cardiovascular clearance 07/10/2015   Pre-operative clearance 07/07/2015   Rotator cuff tear 07/07/2015   Rectocele, female 08/04/2014   COPD with chronic bronchitis and emphysema ([place]) 02/14/2014   Other malaise and fatigue 12/31/2013   PAF (paroxysmal atrial fibrillation) ([place]) 01/10/2013   Thrombosed external hemorrhoid 11/26/2012   Chronic renal insufficiency, stage III (moderate) (HCC) 09/03/2011   Atherosclerosis of native arteries of the extremities with intermittent claudication 04/04/2011   Occlusion and stenosis of carotid artery without mention  of cerebral infarction 04/04/2011   PERIPHERAL NEUROPATHY 08/15/2008   Hypothyroidism (acquired) 08/12/2007   Hyperlipidemia with target LDL less than 100 08/12/2007   Essential hypertension 08/12/2007   Osteopenia 08/12/2007   Carotid artery stenosis, asymptomatic 08/11/2007   Osteoarthritis 08/11/2007   BASAL CELL CARCINOMA, HX OF 08/11/2007   FIBROCYSTIC BREAST DISEASE 11/18/2006   DIVERTICULOSIS, COLON 08/10/2006    Past Surgical History:  Procedure Laterality Date   ABDOMINAL HYSTERECTOMY  1973   age 13 due to dysfuctional  menses; HRT x 25-30 years   APPENDECTOMY  1952   basal cell cancer  03/2006   leg   BILATERAL OOPHORECTOMY  1990   prophylactically (sister had ovarian ca)   BRONCHIAL BRUSHINGS  08/30/2019   Procedure: BRONCHIAL BRUSHINGS;  Surgeon: Candee Furbish, MD;  Location: [facility];  Service: Endoscopy;;   BRONCHIAL WASHINGS  08/30/2019   Procedure: BRONCHIAL WASHINGS;  Surgeon: Candee Furbish, MD;  Location: [facility];  Service: Endoscopy;;   CARPAL TUNNEL RELEASE Bilateral 1989   right   CATARACT EXTRACTION, BILATERAL  2010   Dr Kathrin Penner   CHOLECYSTECTOMY  2006   COLONOSCOPY     Dr Ardis Hughs   EYE SURGERY Left    Bilateral eye (film removed lt eye 01/09/17)   LUMBAR LAMINECTOMY/DECOMPRESSION MICRODISCECTOMY Left 03/20/2017   Procedure: LEFT LUMBAR FOURLUMBAR FIVE LAMINOTOMY AND MICROICRODISCECTOMY 1 LEVEL;  Surgeon: Jovita Gamma, MD;  Location: [place];  Service: Neurosurgery;  Laterality: Left;  LEFT   NM MYOVIEW LTD  06/13/2008   low risk scan   ORTHOPEDIC SURGERY  1989/200/2002/2012   elbows,shoulder surgery x 3, right hand   PACEMAKER IMPLANT N/A 09/06/2019   Procedure: PACEMAKER IMPLANT;  Surgeon: Evans Lance, MD;  Location: [place] [facility];  Service: Cardiovascular;  Laterality: N/A;   RECTOCELE REPAIR  2016   TONSILLECTOMY  1957   US ECHOCARDIOGRAPHY  05/01/2010   trace MR,AI,TR;EF =>55%   VIDEO BRONCHOSCOPY Bilateral 07/17/2017   Procedure: VIDEO BRONCHOSCOPY WITHOUT FLUORO;  Surgeon: Rigoberto Noel, MD;  Location: [facility];  Service: Cardiopulmonary;  Laterality: Bilateral;   VIDEO BRONCHOSCOPY Bilateral 09/02/2017   Procedure: VIDEO BRONCHOSCOPY WITHOUT FLUORO;  Surgeon: Rigoberto Noel, MD;  Location: [facility];  Service: Cardiopulmonary;  Laterality: Bilateral;   VIDEO BRONCHOSCOPY N/A 08/30/2019   Procedure: VIDEO BRONCHOSCOPY WITHOUT FLUORO;  Surgeon: Candee Furbish, MD;  Location: [facility];  Service: Endoscopy;  Laterality: N/A;     OB History      Gravida  3   Para      Term      Preterm      AB  2   Living  1      SAB  2   IAB      Ectopic      Multiple      Live Births              Family History  Problem Relation Age of Onset   Heart attack Father 110   Heart disease Father        Before age 19   Colon cancer Mother 51   Stroke Mother 97   Kidney disease Mother        ? renal calculi; S/P resecton of kidney   Cancer - Colon Mother 2   Ovarian cancer Sister    Other Sister        Valve replacement (aortic & mitral ) in 2 sisters   Heart disease Sister  Before age 90   Heart disease Brother        aortic & mitral valve replacement in 2 bro; both had CBAG   Diabetes Neg Hx    Breast cancer Neg Hx     Social History   Tobacco Use   Smoking status: Never   Smokeless tobacco: Never  Vaping Use   Vaping Use: Never used  Substance Use Topics   Alcohol use: No    Alcohol/week: 0.0 standard drinks   Drug use: No    Home Medications Prior to Admission medications   Medication Sig Start Date End Date Taking? Authorizing Provider  apixaban (ELIQUIS) 2.5 MG TABS tablet Take 1 tablet (2.5 mg total) by mouth 2 (two) times daily. APPOINTMENT NEEDED FOR FURTHER REFILLS. CONTACT YOUR CARDIOLOGIST 02/15/21   Croitoru, Dani Gobble, MD  b complex vitamins tablet Take 1 tablet by mouth daily after supper.    [provider]  cholecalciferol (VITAMIN D3) 25 MCG (1000 UNIT) tablet Take 1,000 Units by mouth daily.    [provider]  cyclobenzaprine (FLEXERIL) 10 MG tablet Take 10 mg by mouth 2 (two) times daily as needed for muscle spasms (scheduled every every).     [provider]  diclofenac sodium (VOLTAREN) 1 % GEL Apply 2-4 g topically 4 (four) times daily as needed (for pain).     [provider]  flecainide (TAMBOCOR) 50 MG tablet Take 1.5 tablets (75 mg total) by mouth 2 (two) times daily. TAKE 1 TABLET(50 MG) BY MOUTH EVERY 12 HOURS 01/23/21   Baldwin Jamaica, PA-C   hydrALAZINE (APRESOLINE) 25 MG tablet Take 1 tablet (25 mg total) by mouth every 8 (eight) hours as needed (if systolic bp more than 665 mmhg). 09/19/20 09/19/21  Shelly Coss, MD  levothyroxine (SYNTHROID) 75 MCG tablet Take 1 tablet (75 mcg total) by mouth daily before breakfast. Taking the brand "synthroid" 09/07/19   Dessa Phi, DO  LORazepam (ATIVAN) 0.5 MG tablet Take 1 tablet (0.5 mg total) by mouth 3 (three) times daily as needed. 10/17/15   Binnie Rail, MD  meclizine (ANTIVERT) 25 MG tablet Take 1 tablet (25 mg total) by mouth daily. 1/2 every 8 hrs prn for imbalance 11/01/10   Hendricks Limes, MD  metoprolol succinate (TOPROL-XL) 100 MG 24 hr tablet Take 1 tablet (100 mg total) by mouth daily. Take with or immediately following a meal. 01/23/21 10/20/21  Baldwin Jamaica, PA-C  senna (SENOKOT) 8.6 MG tablet Take 1 tablet by mouth every other day.     [provider]  traMADol (ULTRAM) 50 MG tablet 1/2-1 by mouth once daily as needed. 10/17/15   Binnie Rail, MD    Allergies    Aspirin, Lovastatin, Benazepril hcl, Paroxetine, Tape, Valacyclovir hcl, Bupropion hcl, Ezetimibe, Fenofibrate, and Pravastatin sodium  Review of Systems   Review of Systems  Constitutional: Negative.   HENT: Negative.    Respiratory: Negative.    Cardiovascular: Negative.   Gastrointestinal: Negative.   Genitourinary: Negative.   Musculoskeletal:  Positive for myalgias.  Skin: Negative.   Neurological: Negative.    Physical Exam Updated Vital Signs BP (!) 185/109   Pulse 94   Temp 98.3 F (36.8 C) (Oral)   Resp 14   SpO2 99%   Physical Exam Vitals and nursing note reviewed.  Constitutional:      Appearance: She is normal weight. She is not ill-appearing or toxic-appearing.  HENT:     Head: Normocephalic and atraumatic.  Nose: Nose normal. No congestion.     Mouth/Throat:     Mouth: Mucous membranes are moist.     Pharynx: Oropharynx is clear. Uvula midline. No  oropharyngeal exudate or posterior oropharyngeal erythema.     Tonsils: No tonsillar exudate.  Eyes:     General: Lids are normal. Vision grossly intact.        Right eye: No discharge.        Left eye: No discharge.     Extraocular Movements: Extraocular movements intact.     Conjunctiva/sclera: Conjunctivae normal.     Pupils: Pupils are equal, round, and reactive to light.  Neck:     Trachea: Trachea and phonation normal.  Cardiovascular:     Rate and Rhythm: Normal rate and regular rhythm.     Pulses: Normal pulses.     Heart sounds: Normal heart sounds. No murmur heard. Pulmonary:     Effort: Pulmonary effort is normal. No tachypnea, bradypnea, accessory muscle usage, prolonged expiration or respiratory distress.     Breath sounds: Normal breath sounds. No wheezing or rales.  Chest:     Chest wall: No mass, lacerations, deformity, swelling, tenderness or crepitus.  Abdominal:     General: Bowel sounds are normal. There is no distension.     Palpations: Abdomen is soft.     Tenderness: There is no abdominal tenderness. There is no right CVA tenderness, left CVA tenderness, guarding or rebound.  Musculoskeletal:        General: No deformity.     Cervical back: Normal range of motion and neck supple. No edema, rigidity or crepitus. No pain with movement, spinous process tenderness or muscular tenderness.     Right hip: Normal.     Left hip: Tenderness and bony tenderness present. No lacerations or crepitus. Decreased range of motion. Normal strength.     Right upper leg: Normal.     Left upper leg: Normal.     Right knee: Normal.     Left knee: No swelling, deformity, effusion or bony tenderness. Tenderness present over the medial joint line.     Right lower leg: Normal. No edema.     Left lower leg: Normal. No swelling, tenderness or bony tenderness. No edema.     Right ankle: Normal.     Right Achilles Tendon: Normal.     Left ankle: Normal.     Left Achilles Tendon: Normal.      Right foot: Normal.     Left foot: Normal.       Legs:     Comments: 1+ pedal pulses bilaterally  Lymphadenopathy:     Cervical: No cervical adenopathy.  Skin:    General: Skin is warm and dry.     Capillary Refill: Capillary refill takes less than 2 seconds.  Neurological:     General: No focal deficit present.     Mental Status: She is alert and oriented to person, place, and time. Mental status is at baseline.  Psychiatric:        Mood and Affect: Mood normal.    ED Results / Procedures / Treatments   Labs (all labs ordered are listed, but only abnormal results are displayed) Labs Reviewed - No data to display  EKG None  Radiology DG Knee Complete 4 Views Left  Result Date: 03/10/2021 CLINICAL DATA:  Status post fall. EXAM: LEFT KNEE - COMPLETE 4+ VIEW COMPARISON:  None. FINDINGS: No evidence of fracture, dislocation, or joint effusion. No evidence of severe  arthropathy or other focal bone abnormality. Soft tissues are unremarkable. IMPRESSION: Negative. Electronically Signed   By: Iven Finn M.D.   On: 03/10/2021 15:48   DG Hip Unilat With Pelvis 2-3 Views Left  Result Date: 03/10/2021 CLINICAL DATA:  Fall yesterday.  Contusion.  On Eliquis. EXAM: DG HIP (WITH OR WITHOUT PELVIS) 2-3V LEFT COMPARISON:  None. FINDINGS: Markedly limited evaluation due to overlapping osseous structures and overlying soft tissues. There is no evidence of hip fracture or dislocation of the left hip. Frontal view of the right hip with no acute displaced fracture or dislocation. No acute displaced fracture or diastasis of the bones of the pelvis. Degenerative changes of the visualized lower lumbar spine. Limited evaluation to evaluate the sacrum due to overlying bowel gas. There is no evidence of severe arthropathy or other focal bone abnormality. IMPRESSION: Negative. Electronically Signed   By: Iven Finn M.D.   On: 03/10/2021 15:47   DG FEMUR MIN 2 VIEWS LEFT  Result Date:  03/10/2021 CLINICAL DATA:  Fall striking left hip yesterday. EXAM: LEFT FEMUR 2 VIEWS COMPARISON:  Hip radiographs from 03/10/2021 FINDINGS: Mild irregularity of the upper margin of the greater trochanter is probably from spurring. This is less likely to be an indicator of underlying fracture, but if the patient is unable to bear weight then CT or MRI would be recommended for further investigation. The femur appears otherwise unremarkable. IMPRESSION: 1. Irregular upper margin of the greater trochanter, probably from chronic spurring, less likely an indicator of subtle fracture. Consider cross-sectional imaging if the patient is unable to bear weight or if otherwise clinically warranted. Electronically Signed   By: Van Clines M.D.   On: 03/10/2021 17:08    Procedures Procedures   Medications Ordered in ED Medications  acetaminophen (TYLENOL) tablet 1,000 mg (1,000 mg Oral Patient Refused/Not Given 03/10/21 1659)    ED Course  I have reviewed the triage vital signs and the nursing notes.  Pertinent labs & imaging results that were available during my care of the patient were reviewed by me and considered in my medical decision making (see chart for details).    MDM Rules/Calculators/A&P                         80 year old female presents with concern for left hip pain after fall yesterday.  Initially ambulatory yesterday after fall.  Difficulty ambulating today.  Differential diagnosis was limited to acute fracture dislocation, ligamentous injury, hematoma.  Hypertensive on intake and vital signs are normal.  Cardiopulmonary dam is normal abdominal exam is benign.  Musculoskeletal exam very reassuring aside from bruising and hematoma over the left greater trochanter and tenderness palpation of the midshaft of the femur on the left.  Patient is neurovascular intact in all 4 extremities.  Films obtained as above, very reassuring without acute fracture or dislocation.  Patient ambulated by  ED tech and this provider with a walker successfully without assistance.  Do not feel any further work-up is warranted in the this time and boarding for SNF placement is not reasonable this evening given patient is able to ambulate with walker which is her baseline mode of ambulation.  Will place [facility] consult for assistance in securing home health, will also place home health order in the outpatient setting.  Patient may continue use Tylenol as needed for her pain.  [name], patient's daughter at the bedside voiced extreme concern regarding her being discharged home as she states she is  unable to assist her mother if she falls or lift her.  Patient was able to ambulate in the ED with walker; no indication for admission to the hospital given bruising over the left hip since patient is ambulatory at this time.  Will place outpatient referrals as listed above and recommend close PCP follow-up in the outpatient setting.  No further work-up warranted in ED this time.  Carlotta and her daughter, Lynelle Smoke at the bedside voiced understanding of her medical evaluation and treatment plan.  Each of her questions was answered to their expressed satisfaction.  Return precautions are given.  Patient is well-appearing, stable, and appropriate for discharge at this time.  This chart was dictated using voice recognition software, Dragon. Despite the best efforts of this provider to proofread and correct errors, errors may still occur which can change documentation meaning.  Final Clinical Impression(s) / ED Diagnoses Final diagnoses:  Fall    Rx / DC Orders ED Discharge Orders     None        Aura Dials 03/10/21 1851    Milton Ferguson, MD 03/12/21 1659

## 2021-03-10 NOTE — ED Triage Notes (Signed)
Pt comes via [facility] from home after mechanical fall hitting her L hip yesterday. Contusion noted. Pt is on Eliquis but denies hitting head. Alert and oriented. Lives with daughter

## 2021-03-11 ENCOUNTER — Telehealth: Payer: Self-pay

## 2021-03-11 NOTE — Telephone Encounter (Signed)
Called to follow up with patent and left message to return call to discuss next steps.

## 2021-03-12 ENCOUNTER — Emergency Department (HOSPITAL_COMMUNITY): Payer: Medicare Other

## 2021-03-12 ENCOUNTER — Inpatient Hospital Stay (HOSPITAL_COMMUNITY)
Admission: EM | Admit: 2021-03-12 | Discharge: 2021-03-19 | DRG: 689 | Disposition: A | Discharge status: Skilled Nursing Facility | Payer: Medicare Other | Attending: Internal Medicine | Admitting: Internal Medicine

## 2021-03-12 ENCOUNTER — Other Ambulatory Visit (HOSPITAL_COMMUNITY): Payer: Self-pay | Admitting: Radiology

## 2021-03-12 DIAGNOSIS — M25552 Pain in left hip: Secondary | ICD-10-CM | POA: Diagnosis not present

## 2021-03-12 DIAGNOSIS — N390 Urinary tract infection, site not specified: Secondary | ICD-10-CM

## 2021-03-12 DIAGNOSIS — G9341 Metabolic encephalopathy: Secondary | ICD-10-CM | POA: Diagnosis present

## 2021-03-12 DIAGNOSIS — N184 Chronic kidney disease, stage 4 (severe): Secondary | ICD-10-CM | POA: Diagnosis not present

## 2021-03-12 DIAGNOSIS — I129 Hypertensive chronic kidney disease with stage 1 through stage 4 chronic kidney disease, or unspecified chronic kidney disease: Secondary | ICD-10-CM | POA: Diagnosis present

## 2021-03-12 DIAGNOSIS — E039 Hypothyroidism, unspecified: Secondary | ICD-10-CM | POA: Diagnosis present

## 2021-03-12 DIAGNOSIS — E872 Acidosis: Secondary | ICD-10-CM | POA: Diagnosis present

## 2021-03-12 DIAGNOSIS — L89152 Pressure ulcer of sacral region, stage 2: Secondary | ICD-10-CM | POA: Diagnosis present

## 2021-03-12 DIAGNOSIS — Z800 Family history of malignant neoplasm of digestive organs: Secondary | ICD-10-CM | POA: Diagnosis not present

## 2021-03-12 DIAGNOSIS — S0990XA Unspecified injury of head, initial encounter: Secondary | ICD-10-CM | POA: Diagnosis not present

## 2021-03-12 DIAGNOSIS — S72009A Fracture of unspecified part of neck of unspecified femur, initial encounter for closed fracture: Secondary | ICD-10-CM | POA: Diagnosis present

## 2021-03-12 DIAGNOSIS — Z823 Family history of stroke: Secondary | ICD-10-CM | POA: Diagnosis not present

## 2021-03-12 DIAGNOSIS — Z7401 Bed confinement status: Secondary | ICD-10-CM | POA: Diagnosis not present

## 2021-03-12 DIAGNOSIS — M1990 Unspecified osteoarthritis, unspecified site: Secondary | ICD-10-CM | POA: Diagnosis present

## 2021-03-12 DIAGNOSIS — I739 Peripheral vascular disease, unspecified: Secondary | ICD-10-CM | POA: Diagnosis present

## 2021-03-12 DIAGNOSIS — R296 Repeated falls: Secondary | ICD-10-CM | POA: Diagnosis present

## 2021-03-12 DIAGNOSIS — S72115A Nondisplaced fracture of greater trochanter of left femur, initial encounter for closed fracture: Secondary | ICD-10-CM

## 2021-03-12 DIAGNOSIS — Z85828 Personal history of other malignant neoplasm of skin: Secondary | ICD-10-CM

## 2021-03-12 DIAGNOSIS — Z886 Allergy status to analgesic agent status: Secondary | ICD-10-CM

## 2021-03-12 DIAGNOSIS — Z8249 Family history of ischemic heart disease and other diseases of the circulatory system: Secondary | ICD-10-CM | POA: Diagnosis not present

## 2021-03-12 DIAGNOSIS — B9620 Unspecified Escherichia coli [E. coli] as the cause of diseases classified elsewhere: Secondary | ICD-10-CM | POA: Diagnosis present

## 2021-03-12 DIAGNOSIS — R55 Syncope and collapse: Secondary | ICD-10-CM | POA: Diagnosis not present

## 2021-03-12 DIAGNOSIS — I6529 Occlusion and stenosis of unspecified carotid artery: Secondary | ICD-10-CM | POA: Diagnosis not present

## 2021-03-12 DIAGNOSIS — R4182 Altered mental status, unspecified: Secondary | ICD-10-CM | POA: Diagnosis not present

## 2021-03-12 DIAGNOSIS — S72112A Displaced fracture of greater trochanter of left femur, initial encounter for closed fracture: Secondary | ICD-10-CM | POA: Diagnosis present

## 2021-03-12 DIAGNOSIS — F411 Generalized anxiety disorder: Secondary | ICD-10-CM

## 2021-03-12 DIAGNOSIS — Z950 Presence of cardiac pacemaker: Secondary | ICD-10-CM | POA: Diagnosis not present

## 2021-03-12 DIAGNOSIS — I119 Hypertensive heart disease without heart failure: Secondary | ICD-10-CM | POA: Diagnosis not present

## 2021-03-12 DIAGNOSIS — M47812 Spondylosis without myelopathy or radiculopathy, cervical region: Secondary | ICD-10-CM | POA: Diagnosis not present

## 2021-03-12 DIAGNOSIS — Z8041 Family history of malignant neoplasm of ovary: Secondary | ICD-10-CM | POA: Diagnosis not present

## 2021-03-12 DIAGNOSIS — N1832 Chronic kidney disease, stage 3b: Secondary | ICD-10-CM | POA: Diagnosis present

## 2021-03-12 DIAGNOSIS — Z66 Do not resuscitate: Secondary | ICD-10-CM | POA: Diagnosis present

## 2021-03-12 DIAGNOSIS — Y92009 Unspecified place in unspecified non-institutional (private) residence as the place of occurrence of the external cause: Secondary | ICD-10-CM | POA: Diagnosis not present

## 2021-03-12 DIAGNOSIS — X501XXA Overexertion from prolonged static or awkward postures, initial encounter: Secondary | ICD-10-CM | POA: Diagnosis not present

## 2021-03-12 DIAGNOSIS — I495 Sick sinus syndrome: Secondary | ICD-10-CM | POA: Diagnosis present

## 2021-03-12 DIAGNOSIS — D649 Anemia, unspecified: Secondary | ICD-10-CM | POA: Diagnosis present

## 2021-03-12 DIAGNOSIS — K5900 Constipation, unspecified: Secondary | ICD-10-CM | POA: Diagnosis not present

## 2021-03-12 DIAGNOSIS — I10 Essential (primary) hypertension: Secondary | ICD-10-CM | POA: Diagnosis not present

## 2021-03-12 DIAGNOSIS — S72002A Fracture of unspecified part of neck of left femur, initial encounter for closed fracture: Secondary | ICD-10-CM | POA: Diagnosis not present

## 2021-03-12 DIAGNOSIS — E860 Dehydration: Secondary | ICD-10-CM | POA: Diagnosis present

## 2021-03-12 DIAGNOSIS — E43 Unspecified severe protein-calorie malnutrition: Secondary | ICD-10-CM | POA: Diagnosis present

## 2021-03-12 DIAGNOSIS — F419 Anxiety disorder, unspecified: Secondary | ICD-10-CM | POA: Diagnosis not present

## 2021-03-12 DIAGNOSIS — Z20822 Contact with and (suspected) exposure to covid-19: Secondary | ICD-10-CM | POA: Diagnosis present

## 2021-03-12 DIAGNOSIS — K529 Noninfective gastroenteritis and colitis, unspecified: Secondary | ICD-10-CM | POA: Diagnosis present

## 2021-03-12 DIAGNOSIS — K6289 Other specified diseases of anus and rectum: Secondary | ICD-10-CM | POA: Diagnosis not present

## 2021-03-12 DIAGNOSIS — L8990 Pressure ulcer of unspecified site, unspecified stage: Secondary | ICD-10-CM | POA: Insufficient documentation

## 2021-03-12 DIAGNOSIS — Z7189 Other specified counseling: Secondary | ICD-10-CM | POA: Diagnosis not present

## 2021-03-12 DIAGNOSIS — S199XXA Unspecified injury of neck, initial encounter: Secondary | ICD-10-CM | POA: Diagnosis not present

## 2021-03-12 DIAGNOSIS — J984 Other disorders of lung: Secondary | ICD-10-CM | POA: Diagnosis not present

## 2021-03-12 DIAGNOSIS — Z7901 Long term (current) use of anticoagulants: Secondary | ICD-10-CM

## 2021-03-12 DIAGNOSIS — N179 Acute kidney failure, unspecified: Secondary | ICD-10-CM | POA: Diagnosis present

## 2021-03-12 DIAGNOSIS — J449 Chronic obstructive pulmonary disease, unspecified: Secondary | ICD-10-CM | POA: Diagnosis not present

## 2021-03-12 DIAGNOSIS — Z841 Family history of disorders of kidney and ureter: Secondary | ICD-10-CM

## 2021-03-12 DIAGNOSIS — R404 Transient alteration of awareness: Secondary | ICD-10-CM | POA: Diagnosis not present

## 2021-03-12 DIAGNOSIS — Z91048 Other nonmedicinal substance allergy status: Secondary | ICD-10-CM

## 2021-03-12 DIAGNOSIS — Z79890 Hormone replacement therapy (postmenopausal): Secondary | ICD-10-CM

## 2021-03-12 DIAGNOSIS — Z79899 Other long term (current) drug therapy: Secondary | ICD-10-CM

## 2021-03-12 DIAGNOSIS — E785 Hyperlipidemia, unspecified: Secondary | ICD-10-CM | POA: Diagnosis present

## 2021-03-12 DIAGNOSIS — G9340 Encephalopathy, unspecified: Secondary | ICD-10-CM | POA: Diagnosis not present

## 2021-03-12 DIAGNOSIS — I480 Paroxysmal atrial fibrillation: Secondary | ICD-10-CM | POA: Diagnosis present

## 2021-03-12 DIAGNOSIS — Z885 Allergy status to narcotic agent status: Secondary | ICD-10-CM

## 2021-03-12 DIAGNOSIS — Z888 Allergy status to other drugs, medicaments and biological substances status: Secondary | ICD-10-CM

## 2021-03-12 DIAGNOSIS — R4020 Unspecified coma: Secondary | ICD-10-CM | POA: Diagnosis not present

## 2021-03-12 DIAGNOSIS — S79929A Unspecified injury of unspecified thigh, initial encounter: Secondary | ICD-10-CM | POA: Diagnosis not present

## 2021-03-12 DIAGNOSIS — W19XXXA Unspecified fall, initial encounter: Secondary | ICD-10-CM | POA: Diagnosis present

## 2021-03-12 DIAGNOSIS — S7002XA Contusion of left hip, initial encounter: Secondary | ICD-10-CM | POA: Diagnosis not present

## 2021-03-12 DIAGNOSIS — S72002D Fracture of unspecified part of neck of left femur, subsequent encounter for closed fracture with routine healing: Secondary | ICD-10-CM | POA: Diagnosis not present

## 2021-03-12 DIAGNOSIS — R0689 Other abnormalities of breathing: Secondary | ICD-10-CM | POA: Diagnosis not present

## 2021-03-12 DIAGNOSIS — R442 Other hallucinations: Secondary | ICD-10-CM | POA: Diagnosis not present

## 2021-03-12 DIAGNOSIS — Z515 Encounter for palliative care: Secondary | ICD-10-CM | POA: Diagnosis not present

## 2021-03-12 DIAGNOSIS — G609 Hereditary and idiopathic neuropathy, unspecified: Secondary | ICD-10-CM | POA: Diagnosis not present

## 2021-03-12 DIAGNOSIS — E7800 Pure hypercholesterolemia, unspecified: Secondary | ICD-10-CM | POA: Diagnosis not present

## 2021-03-12 DIAGNOSIS — Z86711 Personal history of pulmonary embolism: Secondary | ICD-10-CM

## 2021-03-12 LAB — CBC WITH DIFFERENTIAL/PLATELET
Abs Immature Granulocytes: 0.03 10*3/uL (ref 0.00–0.07)
Basophils Absolute: 0 10*3/uL (ref 0.0–0.1)
Basophils Relative: 1 %
Eosinophils Absolute: 0.1 10*3/uL (ref 0.0–0.5)
Eosinophils Relative: 1 %
HCT: 38.6 % (ref 36.0–46.0)
Hemoglobin: 12.1 g/dL (ref 12.0–15.0)
Immature Granulocytes: 0 %
Lymphocytes Relative: 7 %
Lymphs Abs: 0.5 10*3/uL — ABNORMAL LOW (ref 0.7–4.0)
MCH: 29.8 pg (ref 26.0–34.0)
MCHC: 31.3 g/dL (ref 30.0–36.0)
MCV: 95.1 fL (ref 80.0–100.0)
Monocytes Absolute: 0.7 10*3/uL (ref 0.1–1.0)
Monocytes Relative: 9 %
Neutro Abs: 5.9 10*3/uL (ref 1.7–7.7)
Neutrophils Relative %: 82 %
Platelets: 202 10*3/uL (ref 150–400)
RBC: 4.06 MIL/uL (ref 3.87–5.11)
RDW: 15.6 % — ABNORMAL HIGH (ref 11.5–15.5)
WBC: 7.2 10*3/uL (ref 4.0–10.5)
nRBC: 0 % (ref 0.0–0.2)

## 2021-03-12 LAB — COMPREHENSIVE METABOLIC PANEL
ALT: 13 U/L (ref 0–44)
AST: 26 U/L (ref 15–41)
Albumin: 2.9 g/dL — ABNORMAL LOW (ref 3.5–5.0)
Alkaline Phosphatase: 108 U/L (ref 38–126)
Anion gap: 12 (ref 5–15)
BUN: 39 mg/dL — ABNORMAL HIGH (ref 8–23)
CO2: 19 mmol/L — ABNORMAL LOW (ref 22–32)
Calcium: 9.3 mg/dL (ref 8.9–10.3)
Chloride: 104 mmol/L (ref 98–111)
Creatinine, Ser: 2.44 mg/dL — ABNORMAL HIGH (ref 0.44–1.00)
GFR, Estimated: 19 mL/min — ABNORMAL LOW (ref >60)
Glucose, Bld: 109 mg/dL — ABNORMAL HIGH (ref 70–99)
Potassium: 4 mmol/L (ref 3.5–5.1)
Sodium: 135 mmol/L (ref 135–145)
Total Bilirubin: 1.3 mg/dL — ABNORMAL HIGH (ref 0.3–1.2)
Total Protein: 6.7 g/dL (ref 6.5–8.1)

## 2021-03-12 LAB — TROPONIN I (HIGH SENSITIVITY): Troponin I (High Sensitivity): 17 ng/L (ref <18)

## 2021-03-12 LAB — RESP PANEL BY RT-PCR (FLU A&B, COVID) ARPGX2
Influenza A by PCR: NEGATIVE
Influenza B by PCR: NEGATIVE
SARS Coronavirus 2 by RT PCR: NEGATIVE

## 2021-03-12 LAB — PROTIME-INR
INR: 1.5 — ABNORMAL HIGH (ref 0.8–1.2)
Prothrombin Time: 17.8 seconds — ABNORMAL HIGH (ref 11.4–15.2)

## 2021-03-12 LAB — AMMONIA: Ammonia: 14 umol/L (ref 9–35)

## 2021-03-12 LAB — ETHANOL: Alcohol, Ethyl (B): <10 mg/dL (ref <10)

## 2021-03-12 MED ORDER — SODIUM CHLORIDE 0.9 % IV BOLUS
500.0000 mL | Freq: Once | INTRAVENOUS | Status: AC
  Administered 2021-03-13: 500 mL via INTRAVENOUS

## 2021-03-12 NOTE — ED Triage Notes (Signed)
BIB [facility] for ALOC. EMS reports pt was last seen at 2230 9/18.  Pt was last spoken to around 11am 9/19, Daughter came to house between 1600-1700, 9/19 and found pt to be laying in the floor hallucinating about snakes crawling on floor.    1st medic arrived on scene after being called around 1900 and found pt in upright position, pale, diaphoretic and AMS with a BP of 68/30.  Pt was laid in supine position and given a total of 461mL of fluid. EMS BP on arrival was 120/72.  Pt is A&O x3 which sounds like her baseline according to family.  Pt does live alone.   Incidentally, pt has bruise on left hip associated with a fall on 9/17 for which she was seen at [facility], scanned and found to have nothing broken.

## 2021-03-12 NOTE — ED Notes (Signed)
Multiple warm blankets placed for rectal temp of 96.4

## 2021-03-12 NOTE — ED Provider Notes (Addendum)
[facility] EMERGENCY DEPARTMENT Provider Note   CSN: [id number] Arrival date & time: 03/12/21  2009     History No chief complaint on file.   Deborah Shields is a 80 y.o. female.  Patient with history of aortic insufficiency, kidney disease, vascular disease, pulmonary embolism, lipids presents with altered mental status.  Patient was last seen at her baseline last evening at 2230.  Daughter/family spoke with her on the phone earlier today and she sounded okay.  Daughter came to the house around 4:00 and found patient laying on the floor hallucinating about snakes crawling on the floor.  EMS arrived and patient was altered, pale and blood pressure 68/30.  Patient responded to 400 cc of fluid and blood pressure is 120/72.  Details of earlier today since last night unknown.  Patient has bruising to left hip.  Patient was seen 2 days prior in the emergency department.  Patient is on Eliquis.      Past Medical History:  Diagnosis Date   Anxiety    Aortic insufficiency    mild due to degenerative changes   Arthritis    Basal cell carcinoma 2007   [facility] Left leg   Carotid artery occlusion    Chronic kidney disease    CRD Stage 3   Conjunctivitis due to adenovirus, both eyes    Degenerative joint disease    Diverticulosis of colon    Dyspnea    with exertion   Heart murmur    Hyperlipidemia    NMR 07/2009: LDL 200 (2260/1233)TG 99, HDL 65. LDL goal =<120, ideally <100. father MI @ 89   Hypertension    Hypothyroidism    Dr Wilson Singer   Microscopic hematuria    Peripheral neuropathy    compressive in UE bilaterally; Dr Daylene Katayama   Peripheral vascular disease ([name])    ICA bilat, Dr.Charles fields, [facility]   Pneumonia    2017   PONV (postoperative nausea and vomiting)    "Inner ear, does  okay with Scopolamine"   Pulmonary embolus ([place])    Rectocele    [place]y [place] spotted fever     Patient Active Problem List   Diagnosis Date Noted   Hip fracture  ([place]) 03/12/2021   AKI (acute kidney injury) ([place]) 01/23/2021   Hypertension, accelerated with heart disease, without CHF 09/19/2020   Atypical chest pain 09/18/2020   SSS (sick sinus syndrome) ([place]) 12/06/2019   Pacemaker 12/06/2019   Hypercholesterolemia 12/06/2019   Underweight 12/06/2019   Pneumonia of right upper lobe due to methicillin resistant Staphylococcus aureus (MRSA) ([place])    Pneumonia due to infectious organism    Community acquired pneumonia 08/16/2018   Malnutrition of moderate degree 07/16/2017   Hemoptysis 07/15/2017   HNP (herniated nucleus pulposus), lumbar 03/20/2017   Edema of both feet 12/10/2016   Tick bite 11/21/2015   Abnormal urine odor 11/21/2015   Other fatigue 11/21/2015   Anxiety 08/15/2015   Encounter for pre-operative cardiovascular clearance 07/10/2015   Pre-operative clearance 07/07/2015   Rotator cuff tear 07/07/2015   Rectocele, female 08/04/2014   COPD with chronic bronchitis and emphysema ([place]) 02/14/2014   Other malaise and fatigue 12/31/2013   PAF (paroxysmal atrial fibrillation) ([place]) 01/10/2013   Thrombosed external hemorrhoid 11/26/2012   Chronic renal insufficiency, stage III (moderate) (HCC) 09/03/2011   Atherosclerosis of native arteries of the extremities with intermittent claudication 04/04/2011   Occlusion and stenosis of carotid artery without mention of cerebral infarction 04/04/2011   PERIPHERAL  NEUROPATHY 08/15/2008   Hypothyroidism (acquired) 08/12/2007   Hyperlipidemia with target LDL less than 100 08/12/2007   Essential hypertension 08/12/2007   Osteopenia 08/12/2007   Carotid artery stenosis, asymptomatic 08/11/2007   Osteoarthritis 08/11/2007   BASAL CELL CARCINOMA, HX OF 08/11/2007   FIBROCYSTIC BREAST DISEASE 11/18/2006   DIVERTICULOSIS, COLON 08/10/2006    Past Surgical History:  Procedure Laterality Date   ABDOMINAL HYSTERECTOMY  1973   age 52 due to dysfuctional menses; HRT x 25-30 years   APPENDECTOMY  1952    basal cell cancer  03/2006   leg   BILATERAL OOPHORECTOMY  1990   prophylactically (sister had ovarian ca)   BRONCHIAL BRUSHINGS  08/30/2019   Procedure: BRONCHIAL BRUSHINGS;  Surgeon: Candee Furbish, MD;  Location: [facility];  Service: Endoscopy;;   BRONCHIAL WASHINGS  08/30/2019   Procedure: BRONCHIAL WASHINGS;  Surgeon: Candee Furbish, MD;  Location: [facility];  Service: Endoscopy;;   CARPAL TUNNEL RELEASE Bilateral 1989   right   CATARACT EXTRACTION, BILATERAL  2010   Dr Kathrin Penner   CHOLECYSTECTOMY  2006   COLONOSCOPY     Dr Ardis Hughs   EYE SURGERY Left    Bilateral eye (film removed lt eye 01/09/17)   LUMBAR LAMINECTOMY/DECOMPRESSION MICRODISCECTOMY Left 03/20/2017   Procedure: LEFT LUMBAR FOURLUMBAR FIVE LAMINOTOMY AND MICROICRODISCECTOMY 1 LEVEL;  Surgeon: Jovita Gamma, MD;  Location: [place];  Service: Neurosurgery;  Laterality: Left;  LEFT   NM MYOVIEW LTD  06/13/2008   low risk scan   ORTHOPEDIC SURGERY  1989/200/2002/2012   elbows,shoulder surgery x 3, right hand   PACEMAKER IMPLANT N/A 09/06/2019   Procedure: PACEMAKER IMPLANT;  Surgeon: Evans Lance, MD;  Location: [facility];  Service: Cardiovascular;  Laterality: N/A;   RECTOCELE REPAIR  2016   TONSILLECTOMY  1957   US ECHOCARDIOGRAPHY  05/01/2010   trace MR,AI,TR;EF =>55%   VIDEO BRONCHOSCOPY Bilateral 07/17/2017   Procedure: VIDEO BRONCHOSCOPY WITHOUT FLUORO;  Surgeon: Rigoberto Noel, MD;  Location: [facility];  Service: Cardiopulmonary;  Laterality: Bilateral;   VIDEO BRONCHOSCOPY Bilateral 09/02/2017   Procedure: VIDEO BRONCHOSCOPY WITHOUT FLUORO;  Surgeon: Rigoberto Noel, MD;  Location: [facility];  Service: Cardiopulmonary;  Laterality: Bilateral;   VIDEO BRONCHOSCOPY N/A 08/30/2019   Procedure: VIDEO BRONCHOSCOPY WITHOUT FLUORO;  Surgeon: Candee Furbish, MD;  Location: [facility];  Service: Endoscopy;  Laterality: N/A;     OB History     Gravida  3   Para      Term      Preterm       AB  2   Living  1      SAB  2   IAB      Ectopic      Multiple      Live Births              Family History  Problem Relation Age of Onset   Heart attack Father 53   Heart disease Father        Before age 29   Colon cancer Mother 28   Stroke Mother 53   Kidney disease Mother        ? renal calculi; S/P resecton of kidney   Cancer - Colon Mother 53   Ovarian cancer Sister    Other Sister        Valve replacement (aortic & mitral ) in 2 sisters   Heart disease Sister  Before age 15   Heart disease Brother        aortic & mitral valve replacement in 2 bro; both had CBAG   Diabetes Neg Hx    Breast cancer Neg Hx     Social History   Tobacco Use   Smoking status: Never   Smokeless tobacco: Never  Vaping Use   Vaping Use: Never used  Substance Use Topics   Alcohol use: No    Alcohol/week: 0.0 standard drinks   Drug use: No    Home Medications Prior to Admission medications   Medication Sig Start Date End Date Taking? Authorizing Provider  apixaban (ELIQUIS) 2.5 MG TABS tablet Take 1 tablet (2.5 mg total) by mouth 2 (two) times daily. APPOINTMENT NEEDED FOR FURTHER REFILLS. CONTACT YOUR CARDIOLOGIST Patient taking differently: Take 2.5 mg by mouth 2 (two) times daily. 02/15/21  Yes Croitoru, Mihai, MD  b complex vitamins tablet Take 1 tablet by mouth daily after supper.   Yes [provider]  cholecalciferol (VITAMIN D3) 25 MCG (1000 UNIT) tablet Take 1,000 Units by mouth daily.   Yes [provider]  cyclobenzaprine (FLEXERIL) 10 MG tablet Take 10 mg by mouth 2 (two) times daily as needed for muscle spasms.   Yes [provider]  diclofenac sodium (VOLTAREN) 1 % GEL Apply 2-4 g topically 4 (four) times daily as needed (for pain).    Yes [provider]  flecainide (TAMBOCOR) 50 MG tablet Take 1.5 tablets (75 mg total) by mouth 2 (two) times daily. TAKE 1 TABLET(50 MG) BY MOUTH EVERY 12 HOURS Patient taking  differently: Take 75 mg by mouth 2 (two) times daily. 01/23/21  Yes Baldwin Jamaica, PA-C  hydrALAZINE (APRESOLINE) 25 MG tablet Take 1 tablet (25 mg total) by mouth every 8 (eight) hours as needed (if systolic bp more than 932 mmhg). 09/19/20 09/19/21 Yes Shelly Coss, MD  levothyroxine (SYNTHROID) 88 MCG tablet Take 88 mcg by mouth daily before breakfast.   Yes [provider]  LORazepam (ATIVAN) 0.5 MG tablet Take 1 tablet (0.5 mg total) by mouth 3 (three) times daily as needed. Patient taking differently: Take 0.5 mg by mouth 3 (three) times daily as needed for anxiety. 10/17/15  Yes Burns, Claudina Lick, MD  meclizine (ANTIVERT) 25 MG tablet Take 1 tablet (25 mg total) by mouth daily. 1/2 every 8 hrs prn for imbalance Patient taking differently: Take 12.5 mg by mouth 3 (three) times daily as needed (imbalance). 11/01/10  Yes Hendricks Limes, MD  metoprolol succinate (TOPROL-XL) 100 MG 24 hr tablet Take 1 tablet (100 mg total) by mouth daily. Take with or immediately following a meal. 01/23/21 10/20/21 Yes Baldwin Jamaica, PA-C  senna (SENOKOT) 8.6 MG tablet Take 1 tablet by mouth every other day.    Yes [provider]  traMADol (ULTRAM) 50 MG tablet 1/2-1 by mouth once daily as needed. Patient taking differently: Take 25-50 mg by mouth daily as needed for moderate pain. 10/17/15  Yes Burns, Claudina Lick, MD  levothyroxine (SYNTHROID) 75 MCG tablet Take 1 tablet (75 mcg total) by mouth daily before breakfast. Taking the brand "synthroid" Patient not taking: Reported on 03/12/2021 09/07/19   Dessa Phi, DO    Allergies    Aspirin, Lovastatin, Benazepril hcl, Febuxostat, Hydrocodone-acetaminophen, Paroxetine, Tape, Valacyclovir hcl, Bupropion hcl, Ezetimibe, Fenofibrate, and Pravastatin sodium  Review of Systems   Review of Systems  Unable to perform ROS: Mental status change   Physical Exam Updated Vital Signs  BP (!) 141/86   Pulse 72   Temp (!) 96.4 F (35.8 C) (Rectal)    Resp 13   SpO2 99%   Physical Exam Vitals and nursing note reviewed.  Constitutional:      General: She is not in acute distress.    Appearance: She is well-developed.  HENT:     Head: Normocephalic and atraumatic.     Mouth/Throat:     Mouth: Mucous membranes are dry.  Eyes:     General:        Right eye: No discharge.        Left eye: No discharge.     Conjunctiva/sclera: Conjunctivae normal.  Neck:     Trachea: No tracheal deviation.  Cardiovascular:     Rate and Rhythm: Normal rate and regular rhythm.     Heart sounds: No murmur heard. Pulmonary:     Effort: Pulmonary effort is normal.     Breath sounds: Normal breath sounds.  Abdominal:     General: There is no distension.     Palpations: Abdomen is soft.     Tenderness: There is no abdominal tenderness. There is no guarding.  Musculoskeletal:     Cervical back: Normal range of motion and neck supple. No rigidity.     Comments: Patient has mild ecchymosis lateral left hip, mild tenderness with palpation and flexion.  No leg length discrepancy.  No other tenderness with movement of major joints in all extremities.  No midline cervical tenderness or step-off.  Skin:    General: Skin is warm.     Capillary Refill: Capillary refill takes less than 2 seconds.  Neurological:     Mental Status: She is alert.     GCS: GCS eye subscore is 4. GCS verbal subscore is 4. GCS motor subscore is 6.     Cranial Nerves: No cranial nerve deficit.     Comments: Patient moves all extremities equal bilateral with mild general weakness, gross sensation intact palpation, pupils equal and horizontal eye movements intact.  Psychiatric:        Behavior: Behavior is not agitated.        Cognition and Memory: Cognition is impaired.     Comments: Patient pleasantly confused    ED Results / Procedures / Treatments   Labs (all labs ordered are listed, but only abnormal results are displayed) Labs Reviewed  COMPREHENSIVE METABOLIC PANEL -  Abnormal; Notable for the following components:      Result Value   CO2 19 (*)    Glucose, Bld 109 (*)    BUN 39 (*)    Creatinine, Ser 2.44 (*)    Albumin 2.9 (*)    Total Bilirubin 1.3 (*)    GFR, Estimated 19 (*)    All other components within normal limits  CBC WITH DIFFERENTIAL/PLATELET - Abnormal; Notable for the following components:   RDW 15.6 (*)    Lymphs Abs 0.5 (*)    All other components within normal limits  PROTIME-INR - Abnormal; Notable for the following components:   Prothrombin Time 17.8 (*)    INR 1.5 (*)    All other components within normal limits  RESP PANEL BY RT-PCR (FLU A&B, COVID) ARPGX2  URINE CULTURE  ETHANOL  AMMONIA  URINALYSIS, ROUTINE W REFLEX MICROSCOPIC  TROPONIN I (HIGH SENSITIVITY)    EKG EKG Interpretation  Date/Time:  Monday March 12 2021 20:18:51 EDT Ventricular Rate:  71 PR Interval:  197 QRS Duration: 123 QT Interval:  464  QTC Calculation: 505 R Axis:   -43 Text Interpretation: Sinus rhythm Nonspecific IVCD with LAD Left ventricular hypertrophy Confirmed by Elnora Morrison [phone]) on 03/12/2021 9:30:38 PM  Radiology CT HEAD WO CONTRAST (5MM)  Result Date: 03/12/2021 CLINICAL DATA:  Head trauma. EXAM: CT HEAD WITHOUT CONTRAST TECHNIQUE: Contiguous axial images were obtained from the base of the skull through the vertex without intravenous contrast. COMPARISON:  CT dated 09/18/2020. FINDINGS: Brain: Moderate age-related atrophy and chronic microvascular ischemic changes. There is no acute hemorrhage. No mass effect or midline shift. No extra-axial fluid collection. Vascular: No hyperdense vessel or unexpected calcification. Skull: Normal. Negative for fracture or focal lesion. Sinuses/Orbits: No acute finding. Other: None IMPRESSION: 1. No acute intracranial pathology. 2. Moderate age-related atrophy and chronic microvascular ischemic changes. Electronically Signed   By: Anner Crete M.D.   On: 03/12/2021 21:59   CT Cervical  Spine Wo Contrast  Result Date: 03/12/2021 CLINICAL DATA:  Neck trauma, intoxicated or obtunded (Age >= 16y) ams, fall EXAM: CT CERVICAL SPINE WITHOUT CONTRAST TECHNIQUE: Multidetector CT imaging of the cervical spine was performed without intravenous contrast. Multiplanar CT image reconstructions were also generated. COMPARISON:  None. FINDINGS: Alignment: 2 mm anterolisthesis of C2 on C3 and 2 mm retrolisthesis of C5 on C6, likely degenerative and similar to prior exam. No traumatic subluxation. Skull base and vertebrae: No acute fracture. Vertebral body heights are maintained. The dens and skull base are intact. Soft tissues and spinal canal: No prevertebral fluid or swelling. No visible canal hematoma. Disc levels: Disc space narrowing and endplate spurring at U3-J4. There is multilevel facet hypertrophy. Bony ankylosis of C2-C3 facets may be degenerative or congenital. Upper chest: Biapical pleuroparenchymal scarring. Patulous upper esophagus. Other: Carotid calcifications. IMPRESSION: Degenerative change in the cervical spine without acute fracture or subluxation. Electronically Signed   By: Keith Rake M.D.   On: 03/12/2021 22:04   DG Pelvis Portable  Result Date: 03/12/2021 CLINICAL DATA:  Fall, left hip bruising. EXAM: PORTABLE PELVIS 1-2 VIEWS COMPARISON:  Pelvis and left hip radiograph 03/10/2021 FINDINGS: Cortical irregularity is again seen involving the left greater trochanter, more suspicious for fracture on the current exam than on prior. No other fracture. Femoral heads are well seated. Pubic rami are intact. Pubic symphysis and sacroiliac joints are congruent. The bones are diffusely under mineralized. IMPRESSION: Cortical irregularity of the left greater trochanter. This was seen on prior exam, however currently is more suspicious for fracture than on previous. Electronically Signed   By: Keith Rake M.D.   On: 03/12/2021 21:23   CT Hip Left Wo Contrast  Result Date:  03/12/2021 CLINICAL DATA:  Pain, fall. EXAM: CT OF THE LEFT HIP WITHOUT CONTRAST TECHNIQUE: Multidetector CT imaging of the left hip was performed according to the standard protocol. Multiplanar CT image reconstructions were also generated. COMPARISON:  Radiograph earlier today. FINDINGS: Bones/Joint/Cartilage Acute fracture through the left greater trochanter is mildly displaced and comminuted. There is no extension to the intertrochanteric femur or femoral neck. Femoral head is well seated. No significant hip joint effusion. Intact pubic rami and acetabulum. Bone island in the left pubic body. Ligaments Suboptimally assessed by CT. Muscles and Tendons No intramuscular hematoma. Soft tissues Soft tissue edema is seen lateral to the left proximal femur. There is wall thickening of the sigmoid colon and rectum with mild pericolonic edema. Air-fluid level in the urinary bladder without bladder wall thickening. IMPRESSION: 1. Acute mildly displaced and comminuted fracture through the left greater trochanter. No extension to  the intertrochanteric femur or femoral neck. 2. Air-fluid level in the urinary bladder without bladder wall thickening, likely due to recent instrumentation. 3. Wall thickening of the sigmoid colon and rectum with mild pericolonic edema, suspicious for colitis/proctitis. Recommend correlation for any GI symptoms. Electronically Signed   By: Keith Rake M.D.   On: 03/12/2021 23:22   DG Chest Portable 1 View  Result Date: 03/12/2021 CLINICAL DATA:  Post fall.  Altered mental status. EXAM: PORTABLE CHEST 1 VIEW COMPARISON:  Radiograph 01/23/2021. FINDINGS: Left-sided pacemaker in place. The heart is normal in size. Stable mediastinal contours with aortic atherosclerosis. Biapical pleuroparenchymal scarring. No acute airspace disease. No pneumothorax, pulmonary edema, or pleural effusion. Skin fold projects over the left hemithorax. The bones are under mineralized. No acute osseous  abnormalities are seen. IMPRESSION: No acute abnormality. Electronically Signed   By: Keith Rake M.D.   On: 03/12/2021 21:18    Procedures Procedures   Medications Ordered in ED Medications  sodium chloride 0.9 % bolus 500 mL (has no administration in time range)    ED Course  I have reviewed the triage vital signs and the nursing notes.  Pertinent labs & imaging results that were available during my care of the patient were reviewed by me and considered in my medical decision making (see chart for details).    MDM Rules/Calculators/A&P                           Patient presents with a second fall in the past few days and altered mental status.  Differential includes metabolic, infectious/urine infection, CNS related/bleed, medication induced, other.  Plan for general work-up to check blood work to look for electrolyte/kidney function abnormalities, urinalysis pending, IV fluid bolus given for clinical dehydration, CT scan of the head and neck pending and x-rays due to frequent falls.  Reviewed medical records and patient was seen recently and had x-rays without acute fracture at the emergency department.  On reassessment patient comfortable, blood pressure normal, normal oxygenation.  Patient's daughter arrived to provide further details of gradual deterioration, frequent falls recently.   Patient blood work reviewed showing acute renal failure creatinine 2.4, was in the ones.  Urinalysis pending.  X-ray showed possible fracture of the hip, CT scan ordered for further delineation.  CT scan head neck no acute fracture or bleeding.  Patient is not safe to go home with frequent falls and to emergency medicine visits last 2 days.  In addition with not able to perform daily activities, lives alone she will need placement in rehab/nursing home.  Patient care be signed out follow-up CT scan results and urinalysis results.  Hospitalist paged to start the process for admission.  Social work  will be contacted in the morning.  CT of the hip showed acute hip fracture.  Orthopedics paged.  Discussed with hospitalist for admission.  Final Clinical Impression(s) / ED Diagnoses Final diagnoses:  Acute encephalopathy  Acute kidney injury ([place])  Frequent falls  Acute hip pain, left  Closed fracture of left hip, initial encounter [facility])    Rx / DC Orders ED Discharge Orders     None        Elnora Morrison, MD 03/12/21 2318    Elnora Morrison, MD 03/12/21 2344

## 2021-03-13 ENCOUNTER — Inpatient Hospital Stay (HOSPITAL_COMMUNITY): Payer: Medicare Other

## 2021-03-13 ENCOUNTER — Other Ambulatory Visit: Payer: Self-pay

## 2021-03-13 DIAGNOSIS — R55 Syncope and collapse: Secondary | ICD-10-CM

## 2021-03-13 DIAGNOSIS — S72002A Fracture of unspecified part of neck of left femur, initial encounter for closed fracture: Secondary | ICD-10-CM | POA: Diagnosis not present

## 2021-03-13 DIAGNOSIS — R4182 Altered mental status, unspecified: Secondary | ICD-10-CM

## 2021-03-13 DIAGNOSIS — N179 Acute kidney failure, unspecified: Secondary | ICD-10-CM

## 2021-03-13 DIAGNOSIS — G9340 Encephalopathy, unspecified: Secondary | ICD-10-CM | POA: Diagnosis not present

## 2021-03-13 DIAGNOSIS — S72115A Nondisplaced fracture of greater trochanter of left femur, initial encounter for closed fracture: Secondary | ICD-10-CM

## 2021-03-13 DIAGNOSIS — K529 Noninfective gastroenteritis and colitis, unspecified: Secondary | ICD-10-CM

## 2021-03-13 DIAGNOSIS — N390 Urinary tract infection, site not specified: Secondary | ICD-10-CM

## 2021-03-13 LAB — ECHOCARDIOGRAM COMPLETE
AR max vel: 1.41 cm2
AV Area VTI: 1.46 cm2
AV Area mean vel: 1.39 cm2
AV Mean grad: 9 mmHg
AV Peak grad: 17 mmHg
Ao pk vel: 2.06 m/s
Area-P 1/2: 3.08 cm2
Calc EF: 65.6 %
S' Lateral: 2 cm
Single Plane A2C EF: 60.6 %
Single Plane A4C EF: 69.6 %

## 2021-03-13 LAB — URINALYSIS, MICROSCOPIC (REFLEX)
RBC / HPF: >50 RBC/hpf (ref 0–5)
WBC, UA: >50 WBC/hpf (ref 0–5)

## 2021-03-13 LAB — BASIC METABOLIC PANEL
Anion gap: 11 (ref 5–15)
BUN: 40 mg/dL — ABNORMAL HIGH (ref 8–23)
CO2: 20 mmol/L — ABNORMAL LOW (ref 22–32)
Calcium: 8.3 mg/dL — ABNORMAL LOW (ref 8.9–10.3)
Chloride: 106 mmol/L (ref 98–111)
Creatinine, Ser: 2.48 mg/dL — ABNORMAL HIGH (ref 0.44–1.00)
GFR, Estimated: 19 mL/min — ABNORMAL LOW (ref >60)
Glucose, Bld: 98 mg/dL (ref 70–99)
Potassium: 3.9 mmol/L (ref 3.5–5.1)
Sodium: 137 mmol/L (ref 135–145)

## 2021-03-13 LAB — URINALYSIS, ROUTINE W REFLEX MICROSCOPIC
Bilirubin Urine: NEGATIVE
Glucose, UA: NEGATIVE mg/dL
Ketones, ur: NEGATIVE mg/dL
Nitrite: NEGATIVE
Protein, ur: 30 mg/dL — AB
Specific Gravity, Urine: 1.02 (ref 1.005–1.030)
pH: 6 (ref 5.0–8.0)

## 2021-03-13 LAB — MAGNESIUM: Magnesium: 2.1 mg/dL (ref 1.7–2.4)

## 2021-03-13 LAB — CK: Total CK: 120 U/L (ref 38–234)

## 2021-03-13 LAB — D-DIMER, QUANTITATIVE: D-Dimer, Quant: 3.69 ug/mL-FEU — ABNORMAL HIGH (ref 0.00–0.50)

## 2021-03-13 LAB — VITAMIN B12: Vitamin B-12: 253 pg/mL (ref 180–914)

## 2021-03-13 MED ORDER — METOPROLOL SUCCINATE ER 100 MG PO TB24
100.0000 mg | ORAL_TABLET | Freq: Every day | ORAL | Status: DC
  Administered 2021-03-13 – 2021-03-19 (×7): 100 mg via ORAL
  Filled 2021-03-13 (×3): qty 1
  Filled 2021-03-13: qty 4
  Filled 2021-03-13 (×3): qty 1

## 2021-03-13 MED ORDER — MORPHINE SULFATE (PF) 2 MG/ML IV SOLN
0.5000 mg | INTRAVENOUS | Status: DC | PRN
Start: 2021-03-13 — End: 2021-03-19

## 2021-03-13 MED ORDER — SODIUM CHLORIDE 0.9 % IV SOLN: INTRAVENOUS | Status: AC

## 2021-03-13 MED ORDER — SODIUM CHLORIDE 0.9 % IV SOLN
1.0000 g | Freq: Every day | INTRAVENOUS | Status: DC
  Administered 2021-03-13 – 2021-03-16 (×4): 1 g via INTRAVENOUS
  Filled 2021-03-13 (×4): qty 10

## 2021-03-13 MED ORDER — APIXABAN 2.5 MG PO TABS
2.5000 mg | ORAL_TABLET | Freq: Two times a day (BID) | ORAL | Status: DC
  Administered 2021-03-13 – 2021-03-19 (×13): 2.5 mg via ORAL
  Filled 2021-03-13 (×13): qty 1

## 2021-03-13 MED ORDER — LEVOTHYROXINE SODIUM 88 MCG PO TABS
88.0000 ug | ORAL_TABLET | Freq: Every day | ORAL | Status: DC
  Administered 2021-03-14 – 2021-03-19 (×6): 88 ug via ORAL
  Filled 2021-03-13 (×7): qty 1

## 2021-03-13 MED ORDER — HYDROCODONE-ACETAMINOPHEN 5-325 MG PO TABS
1.0000 | ORAL_TABLET | Freq: Four times a day (QID) | ORAL | Status: DC | PRN
  Administered 2021-03-13 – 2021-03-19 (×5): 1 via ORAL
  Filled 2021-03-13 (×5): qty 1

## 2021-03-13 MED ORDER — FLECAINIDE ACETATE 50 MG PO TABS
75.0000 mg | ORAL_TABLET | Freq: Two times a day (BID) | ORAL | Status: DC
  Administered 2021-03-13 – 2021-03-19 (×13): 75 mg via ORAL
  Filled 2021-03-13 (×14): qty 2

## 2021-03-13 MED ORDER — METRONIDAZOLE 500 MG/100ML IV SOLN
500.0000 mg | Freq: Two times a day (BID) | INTRAVENOUS | Status: DC
  Administered 2021-03-13 – 2021-03-16 (×7): 500 mg via INTRAVENOUS
  Filled 2021-03-13 (×7): qty 100

## 2021-03-13 NOTE — Progress Notes (Signed)
ANTICOAGULATION CONSULT NOTE - Initial Consult  Pharmacy Consult for apixaban Indication: atrial fibrillation  Allergies  Allergen Reactions   Aspirin Nausea Only and Other (See Comments)    REACTION: GI upset  ( pt can take 81 mg but NOT   325 mg ASA )   Lovastatin Nausea Only and Other (See Comments)    REACTION: nausea   Benazepril Hcl Other (See Comments)    Unknown   Febuxostat     Other reaction(s): angioedema   Hydrocodone-Acetaminophen     Other reaction(s): irritable   Paroxetine Other (See Comments)    Unknown   Tape Other (See Comments)   Valacyclovir Hcl Nausea And Vomiting   Bupropion Hcl Other (See Comments)    REACTION: tinnitis   Ezetimibe Nausea And Vomiting and Other (See Comments)    REACTION: GI symptoms   Fenofibrate Other (See Comments)    Myalgia    Pravastatin Sodium Other (See Comments)    REACTION: elevated CPK - Muscle's in bilateral Leg   Vital Signs: Temp: 99.4 F (37.4 C) (09/20 1030) Temp Source: Oral (09/20 1030) BP: 125/72 (09/20 1100) Pulse Rate: 98 (09/20 1100)  Labs: Recent Labs    03/12/21 2142 03/12/21 2144 03/13/21 0605  HGB 12.1  --   --   HCT 38.6  --   --   PLT 202  --   --   LABPROT 17.8*  --   --   INR 1.5*  --   --   CREATININE 2.44*  --   --   CKTOTAL  --   --  120  TROPONINIHS  --  17  --     CrCl cannot be calculated (Unknown ideal weight.).   Medical History: Past Medical History:  Diagnosis Date   Anxiety    Aortic insufficiency    mild due to degenerative changes   Arthritis    Basal cell carcinoma 2007   [facility] Left leg   Carotid artery occlusion    Chronic kidney disease    CRD Stage 3   Conjunctivitis due to adenovirus, both eyes    Degenerative joint disease    Diverticulosis of colon    Dyspnea    with exertion   Heart murmur    Hyperlipidemia    NMR 07/2009: LDL 200 (2260/1233)TG 99, HDL 65. LDL goal =<120, ideally <100. father MI @ 63   Hypertension    Hypothyroidism    Dr  Wilson Singer   Microscopic hematuria    Peripheral neuropathy    compressive in UE bilaterally; Dr Daylene Katayama   Peripheral vascular disease ([place])    ICA bilat, Dr.Charles fields, [facility]   Pneumonia    2017   PONV (postoperative nausea and vomiting)    "Inner ear, does  okay with Scopolamine"   Pulmonary embolus ([place])    Rectocele    [place]y [place] spotted fever     Medications: see MAR  Assessment: Consult to resume home Eliquis for PTA Afib. CBC wnl. No s/sx of bleeding. LD yesterday, 9/19 @ 1400  Plan: Apixaban 2.5mg  PO BID. -Monitor daily CBC and any s/sx of bleeding  Joetta Manners, PharmD, [facility] Emergency Medicine Clinical Pharmacist ED RPh Phone: [place]: [phone]

## 2021-03-13 NOTE — Progress Notes (Signed)
Patient seen and examined personally, I reviewed the chart, history and physical and admission note, done by admitting physician this morning and agree with the same with following addendum.  Please refer to the morning admission note for more detailed plan of care.  Briefly,  80 year old female with PAF on Eliquis, tachybradycardia syndrome post PPM, CKD 3B, HLD, HTN, hypothyroidism, PVD, history of PE, carotid stenosis, anemia was found on the floor confused hallucinating and crawling by the daughter and brought to the ED for evaluation.  Patient was last normal at her baseline all day earlier in the evening 9/18.  In the ED found to have AKI creatinine up fom 1.9-2.4 previously 1.4 baseline. UA WBC more than 50 suspecting UTI urine culture sent, chest x-ray no acute finding CT head and C-spine negative for acute finding, CT of the left hip acute mildly displaced and comminuted fracture through the left greater trochanter, also with thickening of the sigmoid colon and rectum with mild pericolonic edema suspicious for colitis/proctitis, orthopedic was consulted IV fluid was given, placed on antibiotic and admitted for further management of UTI, left greater Trochanteric fracture, sigmoid colitis/proctitis, AKI,  She is getting EEG in the ED, daughter at the bedside.  Patient is able to tell me she is in the hospital at [facility], current month and seems to be coming around.  On room air denies chest pain shortness of breath.   issues  UTI: Continue antibiotics follow-up urine culture Proctitis/colitis: Ceftriaxone/Flagyl to be continued, diet as tolerated gentle IV fluids  Left greater trochanteric fracture seen by orthopedic nonoperative management.  We will resume Eliquis, diet.  PT OT eval WBAT and follow-up with Dr. Ninfa Linden in 2 weeks  AKI on CKD 3B Mild anion gap metabolic acidosis due to AKI: Baseline creatinine 1.5 but recently 1.9 on admission 2.4.  Repeat BMP after hydration.  Acute  metabolic encephalopathy Multiple falls rule out syncope: Mental status seems to be improving this is likely due to her UTI.  So far work-up unrevealing with CT head, B12, ammonia, alcohol level.  Echo, carotid Doppler,EEG has been ordered to rule out further cause.  Once able to stand up check orthostatics.  She did have D-dimer done work-up of  syncope that was elevated but no respiratory symptoms chest pain do not suspect VTE- and she is already anticoagulated-we will follow-up on echo to see if any right-sided dysfunction.  PAF on Eliquis will be resumed since no surgery continue flecainide and metoprolol Tachybradycardia syndrome with pacemaker in place QT prolongation monitor Goal of care DNR discussed with daughter at the bedside monitor closely elderly at risk of decompensation requires admission

## 2021-03-13 NOTE — ED Notes (Signed)
Pt daughter reports that pt was complaining of pain and wants mother medicated for pain. Pt denies pain, but does appear to be in pain any time she slightly moves or is repositioned in bed. Will give PRN pain meds as ordered.

## 2021-03-13 NOTE — ED Notes (Signed)
Echo at bedside

## 2021-03-13 NOTE — Progress Notes (Signed)
Remote pacemaker transmission.   

## 2021-03-13 NOTE — ED Notes (Signed)
Patient incont of stool. Patient cleaned up and clean brief put on patient. Clean purewick put on patient.

## 2021-03-13 NOTE — Progress Notes (Signed)
  Echocardiogram 2D Echocardiogram has been performed.  Bobbye Charleston 03/13/2021, 12:01 PM

## 2021-03-13 NOTE — ED Notes (Signed)
Secure chat sent to pt d/t order for orthostatic VS and pt's hip fx. Awaiting response for further orders.

## 2021-03-13 NOTE — Progress Notes (Signed)
Lower extremity venous & carotid duplex has been completed.   Preliminary results in CV Proc.   Deborah Shields 03/13/2021 2:11 PM

## 2021-03-13 NOTE — Consult Note (Signed)
Reason for Consult:Left greater troch fx Referring Physician: Antonieta Pert Time called: [id number] Time at bedside: Deborah Shields is an 80 y.o. female.  HPI: Deborah Shields was in the grocery store Friday when she fell after bending down to get something off the bottom shelf. She was able to get back up and finish shopping, albeit with some left hip pain. She was evaluated that night at [facility] but was able to ambulate and was discharged home. She thinks she was managing all right at home but was discovered on the floor again yesterday by her daughter and was very confused with a decreased LOC. Workup showed a greater troch fx and orthopedic surgery was consulted. She lives alone and normally ambulates with a cane.  Past Medical History:  Diagnosis Date   Anxiety    Aortic insufficiency    mild due to degenerative changes   Arthritis    Basal cell carcinoma 2007   [facility] Left leg   Carotid artery occlusion    Chronic kidney disease    CRD Stage 3   Conjunctivitis due to adenovirus, both eyes    Degenerative joint disease    Diverticulosis of colon    Dyspnea    with exertion   Heart murmur    Hyperlipidemia    NMR 07/2009: LDL 200 (2260/1233)TG 99, HDL 65. LDL goal =<120, ideally <100. father MI @ 71   Hypertension    Hypothyroidism    Dr Wilson Singer   Microscopic hematuria    Peripheral neuropathy    compressive in UE bilaterally; Dr Daylene Katayama   Peripheral vascular disease ([place])    ICA bilat, Dr.Charles fields, [facility]   Pneumonia    2017   PONV (postoperative nausea and vomiting)    "Inner ear, does  okay with Scopolamine"   Pulmonary embolus ([place])    Rectocele    [place] spotted fever     Past Surgical History:  Procedure Laterality Date   ABDOMINAL HYSTERECTOMY  1973   age 56 due to dysfuctional menses; HRT x 25-30 years   APPENDECTOMY  1952   basal cell cancer  03/2006   leg   BILATERAL OOPHORECTOMY  1990   prophylactically (sister had ovarian ca)   BRONCHIAL  BRUSHINGS  08/30/2019   Procedure: BRONCHIAL BRUSHINGS;  Surgeon: Candee Furbish, MD;  Location: [facility];  Service: Endoscopy;;   BRONCHIAL WASHINGS  08/30/2019   Procedure: BRONCHIAL WASHINGS;  Surgeon: Candee Furbish, MD;  [facility];  Service: Endoscopy;;   CARPAL TUNNEL RELEASE Bilateral 1989   right   CATARACT EXTRACTION, BILATERAL  2010   Dr Kathrin Penner   CHOLECYSTECTOMY  2006   COLONOSCOPY     Dr Ardis Hughs   EYE SURGERY Left    Bilateral eye (film removed lt eye 01/09/17)   LUMBAR LAMINECTOMY/DECOMPRESSION MICRODISCECTOMY Left 03/20/2017   Procedure: LEFT LUMBAR FOURLUMBAR FIVE LAMINOTOMY AND MICROICRODISCECTOMY 1 LEVEL;  Surgeon: Jovita Gamma, MD;  Location: [place];  Service: Neurosurgery;  Laterality: Left;  LEFT   NM MYOVIEW LTD  06/13/2008   low risk scan   ORTHOPEDIC SURGERY  1989/200/2002/2012   elbows,shoulder surgery x 3, right hand   PACEMAKER IMPLANT N/A 09/06/2019   Procedure: PACEMAKER IMPLANT;  Surgeon: Evans Lance, MD;  Location: [facility];  Service: Cardiovascular;  Laterality: N/A;   RECTOCELE REPAIR  2016   TONSILLECTOMY  1957   US ECHOCARDIOGRAPHY  05/01/2010   trace MR,AI,TR;EF =>55%   VIDEO BRONCHOSCOPY  Bilateral 07/17/2017   Procedure: VIDEO BRONCHOSCOPY WITHOUT FLUORO;  Surgeon: Rigoberto Noel, MD;  Location: [facility];  Service: Cardiopulmonary;  Laterality: Bilateral;   VIDEO BRONCHOSCOPY Bilateral 09/02/2017   Procedure: VIDEO BRONCHOSCOPY WITHOUT FLUORO;  Surgeon: Rigoberto Noel, MD;  [facility];  Service: Cardiopulmonary;  Laterality: Bilateral;   VIDEO BRONCHOSCOPY N/A 08/30/2019   Procedure: VIDEO BRONCHOSCOPY WITHOUT FLUORO;  Surgeon: Candee Furbish, MD;  [facility];  Service: Endoscopy;  Laterality: N/A;    Family History  Problem Relation Age of Onset   Heart attack Father 79   Heart disease Father        Before age 21   Colon cancer Mother 103   Stroke Mother 82   Kidney disease Mother         ? renal calculi; S/P resecton of kidney   Cancer - Colon Mother 38   Ovarian cancer Sister    Other Sister        Valve replacement (aortic & mitral ) in 2 sisters   Heart disease Sister        Before age 68   Heart disease Brother        aortic & mitral valve replacement in 2 bro; both had CBAG   Diabetes Neg Hx    Breast cancer Neg Hx     Social History:  reports that she has never smoked. She has never used smokeless tobacco. She reports that she does not drink alcohol and does not use drugs.  Allergies:  Allergies  Allergen Reactions   Aspirin Nausea Only and Other (See Comments)    REACTION: GI upset  ( pt can take 81 mg but NOT   325 mg ASA )   Lovastatin Nausea Only and Other (See Comments)    REACTION: nausea   Benazepril Hcl Other (See Comments)    Unknown   Febuxostat     Other reaction(s): angioedema   Hydrocodone-Acetaminophen     Other reaction(s): irritable   Paroxetine Other (See Comments)    Unknown   Tape Other (See Comments)   Valacyclovir Hcl Nausea And Vomiting   Bupropion Hcl Other (See Comments)    REACTION: tinnitis   Ezetimibe Nausea And Vomiting and Other (See Comments)    REACTION: GI symptoms   Fenofibrate Other (See Comments)    Myalgia    Pravastatin Sodium Other (See Comments)    REACTION: elevated CPK - Muscle's in bilateral Leg    Medications: I have reviewed the patient's current medications.  Results for orders placed or performed during the hospital encounter of 03/12/21 (from the past 48 hour(s))  Resp Panel by RT-PCR (Flu A&B, Covid) Nasopharyngeal Swab     Status: None   Collection Time: 03/12/21  8:51 PM   Specimen: Nasopharyngeal Swab; Nasopharyngeal(NP) swabs in vial transport medium  Result Value Ref Range   SARS Coronavirus 2 by RT PCR NEGATIVE NEGATIVE    Comment: (NOTE) SARS-CoV-2 target nucleic acids are NOT DETECTED.  The SARS-CoV-2 RNA is generally detectable in upper respiratory specimens during the acute phase  of infection. The lowest concentration of SARS-CoV-2 viral copies this assay can detect is 138 copies/mL. A negative result does not preclude SARS-Cov-2 infection and should not be used as the sole basis for treatment or other patient management decisions. A negative result may occur with  improper specimen collection/handling, submission of specimen other than nasopharyngeal swab, presence of viral mutation(s) within the areas targeted by this assay, and inadequate  number of viral copies(<138 copies/mL). A negative result must be combined with clinical observations, patient history, and epidemiological information. The expected result is Negative.  Fact Sheet for Patients:  EntrepreneurPulse.com.au  Fact Sheet for Healthcare Providers:  IncredibleEmployment.be  This test is no t yet approved or cleared by the [place] FDA and  has been authorized for detection and/or diagnosis of SARS-CoV-2 by FDA under an Emergency Use Authorization (EUA). This EUA will remain  in effect (meaning this test can be used) for the duration of the COVID-19 declaration under Section 564(b)(1) of the Act, 21 U.S.C.section 360bbb-3(b)(1), unless the authorization is terminated  or revoked sooner.       Influenza A by PCR NEGATIVE NEGATIVE   Influenza B by PCR NEGATIVE NEGATIVE    Comment: (NOTE) The Xpert Xpress SARS-CoV-2/FLU/RSV plus assay is intended as an aid in the diagnosis of influenza from Nasopharyngeal swab specimens and should not be used as a sole basis for treatment. Nasal washings and aspirates are unacceptable for Xpert Xpress SARS-CoV-2/FLU/RSV testing.  Fact Sheet for Patients: EntrepreneurPulse.com.au  Fact Sheet for Healthcare Providers: IncredibleEmployment.be  This test is not yet approved or cleared by the [place] FDA and has been authorized for detection and/or diagnosis of SARS-CoV-2 by FDA  under an Emergency Use Authorization (EUA). This EUA will remain in effect (meaning this test can be used) for the duration of the COVID-19 declaration under Section 564(b)(1) of the Act, 21 U.S.C. section 360bbb-3(b)(1), unless the authorization is terminated or revoked.  Performed at [facility], [place] [address] [place], [place] [address]   Comprehensive metabolic panel     Status: Abnormal   Collection Time: 03/12/21  9:42 PM  Result Value Ref Range   Sodium 135 135 - 145 mmol/L   Potassium 4.0 3.5 - 5.1 mmol/L   Chloride 104 98 - 111 mmol/L   CO2 19 (L) 22 - 32 mmol/L   Glucose, Bld 109 (H) 70 - 99 mg/dL    Comment: Glucose reference range applies only to samples taken after fasting for at least 8 hours.   BUN 39 (H) 8 - 23 mg/dL   Creatinine, Ser 2.44 (H) 0.44 - 1.00 mg/dL   Calcium 9.3 8.9 - 10.3 mg/dL   Total Protein 6.7 6.5 - 8.1 g/dL   Albumin 2.9 (L) 3.5 - 5.0 g/dL   AST 26 15 - 41 U/L   ALT 13 0 - 44 U/L   Alkaline Phosphatase 108 38 - 126 U/L   Total Bilirubin 1.3 (H) 0.3 - 1.2 mg/dL   GFR, Estimated 19 (L) >60 mL/min    Comment: (NOTE) Calculated using the CKD-EPI Creatinine Equation (2021)    Anion gap 12 5 - 15    Comment: Performed at [facility] [address], [place], [place] [address]  CBC with Differential     Status: Abnormal   Collection Time: 03/12/21  9:42 PM  Result Value Ref Range   WBC 7.2 4.0 - 10.5 K/uL   RBC 4.06 3.87 - 5.11 MIL/uL   Hemoglobin 12.1 12.0 - 15.0 g/dL   HCT 38.6 36.0 - 46.0 %   MCV 95.1 80.0 - 100.0 fL   MCH 29.8 26.0 - 34.0 pg   MCHC 31.3 30.0 - 36.0 g/dL   RDW 15.6 (H) 11.5 - 15.5 %   Platelets 202 150 - 400 K/uL   nRBC 0.0 0.0 - 0.2 %   Neutrophils Relative % 82 %   Neutro  Abs 5.9 1.7 - 7.7 K/uL   Lymphocytes Relative 7 %   Lymphs Abs 0.5 (L) 0.7 - 4.0 K/uL   Monocytes Relative 9 %   Monocytes Absolute 0.7 0.1 - 1.0 K/uL   Eosinophils Relative 1 %   Eosinophils Absolute 0.1 0.0 - 0.5 K/uL   Basophils  Relative 1 %   Basophils Absolute 0.0 0.0 - 0.1 K/uL   Immature Granulocytes 0 %   Abs Immature Granulocytes 0.03 0.00 - 0.07 K/uL    Comment: Performed at [facility] [address], [place], [place] [address]  Protime-INR     Status: Abnormal   Collection Time: 03/12/21  9:42 PM  Result Value Ref Range   Prothrombin Time 17.8 (H) 11.4 - 15.2 seconds   INR 1.5 (H) 0.8 - 1.2    Comment: (NOTE) INR goal varies based on device and disease states. Performed at [facility], [place] [address], [place], [place] [address]   Ethanol     Status: None   Collection Time: 03/12/21  9:44 PM  Result Value Ref Range   Alcohol, Ethyl (B) <10 <10 mg/dL    Comment: (NOTE) Lowest detectable limit for serum alcohol is 10 mg/dL.  For medical purposes only. Performed at [facility], [place] [address], [place], [place] [address]   Ammonia     Status: None   Collection Time: 03/12/21  9:44 PM  Result Value Ref Range   Ammonia 14 9 - 35 umol/L    Comment: Performed at [facility], [place] [address], [place], [place] [address]  Troponin I (High Sensitivity)     Status: None   Collection Time: 03/12/21  9:44 PM  Result Value Ref Range   Troponin I (High Sensitivity) 17 <18 ng/L    Comment: (NOTE) Elevated high sensitivity troponin I (hsTnI) values and significant  changes across serial measurements may suggest ACS but many other  chronic and acute conditions are known to elevate hsTnI results.  Refer to the "Links" section for chest pain algorithms and additional  guidance. Performed at [facility], [place] [address] [place], [place] [address]   Urinalysis, Routine w reflex microscopic Urine, Catheterized     Status: Abnormal   Collection Time: 03/13/21 12:21 AM  Result Value Ref Range   Color, Urine YELLOW YELLOW   APPearance TURBID (A) CLEAR   Specific Gravity, Urine 1.020 1.005 - 1.030   pH 6.0 5.0 - 8.0   Glucose, UA NEGATIVE NEGATIVE mg/dL   Hgb urine dipstick  LARGE (A) NEGATIVE   Bilirubin Urine NEGATIVE NEGATIVE   Ketones, ur NEGATIVE NEGATIVE mg/dL   Protein, ur 30 (A) NEGATIVE mg/dL   Nitrite NEGATIVE NEGATIVE   Leukocytes,Ua LARGE (A) NEGATIVE    Comment: Performed at [facility] [address], [place], [place] [address]  Urinalysis, Microscopic (reflex)     Status: Abnormal   Collection Time: 03/13/21 12:21 AM  Result Value Ref Range   RBC / HPF >50 0 - 5 RBC/hpf   WBC, UA >50 0 - 5 WBC/hpf   Bacteria, UA MANY (A) NONE SEEN   Squamous Epithelial / LPF 0-5 0 - 5   WBC Clumps PRESENT    Mucus PRESENT    Hyaline Casts, UA PRESENT     Comment: Performed at [facility], [place] [address], [place], [place] [address]  D-dimer, quantitative     Status: Abnormal   Collection Time: 03/13/21  6:05 AM  Result Value Ref Range   D-Dimer, Quant 3.69 (H) 0.00 - 0.50 ug/mL-FEU    Comment: (NOTE) At the manufacturer cut-off value of 0.5 g/mL FEU, this assay has a negative predictive value of 95-100%.This assay is intended for use in conjunction with a clinical pretest probability (PTP) assessment model to exclude pulmonary embolism (PE) and deep venous thrombosis (DVT) in outpatients suspected of PE or DVT. Results should be correlated with clinical presentation. Performed at [facility], [place] [address] [place], [place] [address]   Vitamin B12     Status: None   Collection Time: 03/13/21  6:05 AM  Result Value Ref Range   Vitamin B-12 253 180 - 914 pg/mL    Comment: (NOTE) This assay is not validated for testing neonatal or myeloproliferative syndrome specimens for Vitamin B12 levels. Performed at [facility], [place] [address], [place], [place] [address]   Magnesium     Status: None   Collection Time: 03/13/21  6:05 AM  Result Value Ref Range   Magnesium 2.1 1.7 - 2.4 mg/dL    Comment: Performed at [facility] [address] [place], [place] [address]  CK     Status: None   Collection Time: 03/13/21   6:05 AM  Result Value Ref Range   Total CK 120 38 - 234 U/L    Comment: Performed at [facility], [place] [address], [place],  [address]    CT HEAD WO CONTRAST (5MM)  Result Date: 03/12/2021 CLINICAL DATA:  Head trauma. EXAM: CT HEAD WITHOUT CONTRAST TECHNIQUE: Contiguous axial images were obtained from the base of the skull through the vertex without intravenous contrast. COMPARISON:  CT dated 09/18/2020. FINDINGS: Brain: Moderate age-related atrophy and chronic microvascular ischemic changes. There is no acute hemorrhage. No mass effect or midline shift. No extra-axial fluid collection. Vascular: No hyperdense vessel or unexpected calcification. Skull: Normal. Negative for fracture or focal lesion. Sinuses/Orbits: No acute finding. Other: None IMPRESSION: 1. No acute intracranial pathology. 2. Moderate age-related atrophy and chronic microvascular ischemic changes. Electronically Signed   By: Anner Crete M.D.   On: 03/12/2021 21:59   CT Cervical Spine Wo Contrast  Result Date: 03/12/2021 CLINICAL DATA:  Neck trauma, intoxicated or obtunded (Age >= 16y) ams, fall EXAM: CT CERVICAL SPINE WITHOUT CONTRAST TECHNIQUE: Multidetector CT imaging of the cervical spine was performed without intravenous contrast. Multiplanar CT image reconstructions were also generated. COMPARISON:  None. FINDINGS: Alignment: 2 mm anterolisthesis of C2 on C3 and 2 mm retrolisthesis of C5 on C6, likely degenerative and similar to prior exam. No traumatic subluxation. Skull base and vertebrae: No acute fracture. Vertebral body heights are maintained. The dens and skull base are intact. Soft tissues and spinal canal: No prevertebral fluid or swelling. No visible canal hematoma. Disc levels: Disc space narrowing and endplate spurring at V6-H6. There is multilevel facet hypertrophy. Bony ankylosis of C2-C3 facets may be degenerative or congenital. Upper chest: Biapical pleuroparenchymal scarring. Patulous upper  esophagus. Other: Carotid calcifications. IMPRESSION: Degenerative change in the cervical spine without acute fracture or subluxation. Electronically Signed   By: [place] Rake M.D.   On: 03/12/2021 22:04   DG Pelvis Portable  Result Date: 03/12/2021 CLINICAL DATA:  Fall, left hip bruising. EXAM: PORTABLE PELVIS 1-2 VIEWS COMPARISON:  Pelvis and left hip radiograph 03/10/2021 FINDINGS: Cortical irregularity is again seen involving the left greater trochanter, more suspicious for fracture on the current exam than on prior.  No other fracture. Femoral heads are well seated. Pubic rami are intact. Pubic symphysis and sacroiliac joints are congruent. The bones are diffusely under mineralized. IMPRESSION: Cortical irregularity of the left greater trochanter. This was seen on prior exam, however currently is more suspicious for fracture than on previous. Electronically Signed   By: [place] Rake M.D.   On: 03/12/2021 21:23   CT Hip Left Wo Contrast  Result Date: 03/12/2021 CLINICAL DATA:  Pain, fall. EXAM: CT OF THE LEFT HIP WITHOUT CONTRAST TECHNIQUE: Multidetector CT imaging of the left hip was performed according to the standard protocol. Multiplanar CT image reconstructions were also generated. COMPARISON:  Radiograph earlier today. FINDINGS: Bones/Joint/Cartilage Acute fracture through the left greater trochanter is mildly displaced and comminuted. There is no extension to the intertrochanteric femur or femoral neck. Femoral head is well seated. No significant hip joint effusion. Intact pubic rami and acetabulum. Bone island in the left pubic body. Ligaments Suboptimally assessed by CT. Muscles and Tendons No intramuscular hematoma. Soft tissues Soft tissue edema is seen lateral to the left proximal femur. There is wall thickening of the sigmoid colon and rectum with mild pericolonic edema. Air-fluid level in the urinary bladder without bladder wall thickening. IMPRESSION: 1. Acute mildly displaced and  comminuted fracture through the left greater trochanter. No extension to the intertrochanteric femur or femoral neck. 2. Air-fluid level in the urinary bladder without bladder wall thickening, likely due to recent instrumentation. 3. Wall thickening of the sigmoid colon and rectum with mild pericolonic edema, suspicious for colitis/proctitis. Recommend correlation for any GI symptoms. Electronically Signed   By: [place] Rake M.D.   On: 03/12/2021 23:22   DG Chest Portable 1 View  Result Date: 03/12/2021 CLINICAL DATA:  Post fall.  Altered mental status. EXAM: PORTABLE CHEST 1 VIEW COMPARISON:  Radiograph 01/23/2021. FINDINGS: Left-sided pacemaker in place. The heart is normal in size. Stable mediastinal contours with aortic atherosclerosis. Biapical pleuroparenchymal scarring. No acute airspace disease. No pneumothorax, pulmonary edema, or pleural effusion. Skin fold projects over the left hemithorax. The bones are under mineralized. No acute osseous abnormalities are seen. IMPRESSION: No acute abnormality. Electronically Signed   By: [place] Rake M.D.   On: 03/12/2021 21:18    Review of Systems  HENT:  Negative for ear discharge, ear pain, hearing loss and tinnitus.   Eyes:  Negative for photophobia and pain.  Respiratory:  Negative for cough and shortness of breath.   Cardiovascular:  Negative for chest pain.  Gastrointestinal:  Negative for abdominal pain, nausea and vomiting.  Genitourinary:  Negative for dysuria, flank pain, frequency and urgency.  Musculoskeletal:  Positive for arthralgias (Left hip). Negative for back pain, myalgias and neck pain.  Neurological:  Negative for dizziness and headaches.  Hematological:  Does not bruise/bleed easily.  Psychiatric/Behavioral:  The patient is not nervous/anxious.   Blood pressure 123/74, pulse 76, temperature 97.7 F (36.5 C), temperature source Oral, resp. rate 14, SpO2 100 %. Physical Exam Constitutional:      General: She is not in  acute distress.    Appearance: She is well-developed. She is not diaphoretic.  HENT:     Head: Normocephalic and atraumatic.  Eyes:     General: No scleral icterus.       Right eye: No discharge.        Left eye: No discharge.     Conjunctiva/sclera: Conjunctivae normal.  Cardiovascular:     Rate and Rhythm: Normal rate and regular rhythm.  Pulmonary:  Effort: Pulmonary effort is normal. No respiratory distress.  Musculoskeletal:     Cervical back: Normal range of motion.     Comments: LLE No traumatic wounds, ecchymosis, or rash  Mod TTP hip  No knee or ankle effusion  Knee stable to varus/ valgus and anterior/posterior stress  Sens DPN, SPN, TN intact  Motor EHL, ext, flex, evers 5/5  DP 0, PT 0, Toes perfused, No significant edema  Skin:    General: Skin is warm and dry.  Neurological:     Mental Status: She is alert.  Psychiatric:        Mood and Affect: Mood normal.        Behavior: Behavior normal.    Assessment/Plan: Left greater troch fx -- Plan non-operative management with WBAT with RW only and no hip abduction. F/u with Dr. Ninfa Linden in 2 weeks. Multiple medical problems including paroxysmal A. fib on Eliquis, tachybrady syndrome status post PPM, CKD stage IIIb, hyperlipidemia, hypertension, hypothyroidism, PVD, history of PE, carotid stenosis, and anemia -- per primary service. No need to hold Eliquis.    Lisette Abu, PA-C Orthopedic Surgery [phone] 03/13/2021, 10:49 AM

## 2021-03-13 NOTE — Progress Notes (Signed)
EEG complete - results pending 

## 2021-03-13 NOTE — ED Notes (Signed)
EEG at bedside at this time. Pt tolerating well.

## 2021-03-13 NOTE — Procedures (Signed)
Patient Name: Deborah Shields  MRN: [id number]  Epilepsy Attending: Lora Havens  Referring Physician/Provider: Dr Derrick Ravel Date: 03/13/2021 Duration: 22.45 mins  Patient history: 80yo F with ams and syncope. EEG to evaluate for seizure.   Level of alertness: Awake,asleep  AEDs during EEG study: None  Technical aspects: This EEG study was done with scalp electrodes positioned according to the 10-20 International system of electrode placement. Electrical activity was acquired at a sampling rate of 500Hz  and reviewed with a high frequency filter of 70Hz  and a low frequency filter of 1Hz . EEG data were recorded continuously and digitally stored.   Description: The posterior dominant rhythm consists of 8-9 Hz activity of moderate voltage (25-35 uV) seen predominantly in posterior head regions, symmetric and reactive to eye opening and eye closing. Sleep was characterized by vertex waves, sleep spindles (12 to 14 Hz), maximal frontocentral region. Hyperventilation and photic stimulation were not performed.     IMPRESSION: This study is within normal limits. No seizures or epileptiform discharges were seen throughout the recording.  Chalise Pe Barbra Sarks

## 2021-03-13 NOTE — H&P (Signed)
History and Physical    Deborah Shields EVO:[id number] DOB: [birth year] DOA: 03/12/2021  PCP: Lajean Manes, MD Patient coming from: Home  Chief Complaint: Fall, altered mental status  HPI: Deborah Shields is a 80 y.o. female with medical history significant of paroxysmal A. fib on Eliquis, tachybrady syndrome status post PPM, CKD stage IIIb, hyperlipidemia, hypertension, hypothyroidism, PVD, history of PE, carotid stenosis, anemia.  Family reported that patient was last seen at her baseline 9/18 evening.  Yesterday afternoon patient's daughter found her on the floor, she was hallucinating and talking about snakes crawling on the floor.  When EMS arrived patient was altered, pale, and hypotensive with blood pressure 68/30.  She was given a small fluid bolus and blood pressure improved.  Patient noted to have bruising to her left hip.  She was seen in the ED 2 days ago as well after another fall.  In the ED, she was slightly hypothermic and warm blankets applied.  Not tachycardic or hypotensive.  Not hypoxic.  Labs showing no leukocytosis or anemia.  Bicarb 19, anion gap 12. BUN 39, creatinine 2.4 (was 1.9 last month but baseline around 1.4).  COVID and influenza PCR negative.  Blood ethanol level undetectable.  Ammonia normal.  High-sensitivity troponin negative.  UA with large amount of leukocytes, greater than 50 WBCs, and many bacteria.  Urine culture pending.  Chest x-ray negative for acute finding.  CT head and C-spine negative for acute finding.  CT of left hip showing acute mildly displaced and comminuted fracture through the left greater trochanter.  Also showing wall thickening of the sigmoid colon and rectum with mild pericolonic edema suspicious for colitis/proctitis. Orthopedics consulted by ED physician.  Patient was given a 500 cc fluid bolus.  Patient able to give limited history as she is confused.  She does not know why she is here.  Does not recall falling.  Her only complaint  is dysuria.  Denies cough, shortness of breath, chest pain, nausea, vomiting, abdominal pain, or diarrhea.  Denies any pain.  Daughter states patient lives on her own and has become increasingly more confused over time and stopped eating.  Yesterday afternoon daughter went to check on the patient and found her on the floor, confused and hallucinating.  Daughter states patient was seen in the emergency room 2 days ago after another fall and was discharged.  She is requesting nursing home placement as it is not safe for her to return home.  Review of Systems:  All systems reviewed and apart from history of presenting illness, are negative.  Past Medical History:  Diagnosis Date   Anxiety    Aortic insufficiency    mild due to degenerative changes   Arthritis    Basal cell carcinoma 2007   [place] [facility] Left leg   Carotid artery occlusion    Chronic kidney disease    CRD Stage 3   Conjunctivitis due to adenovirus, both eyes    Degenerative joint disease    Diverticulosis of colon    Dyspnea    with exertion   Heart murmur    Hyperlipidemia    NMR 07/2009: LDL 200 (2260/1233)TG 99, HDL 65. LDL goal =<120, ideally <100. father MI @ 41   Hypertension    Hypothyroidism    Dr Wilson Singer   Microscopic hematuria    Peripheral neuropathy    compressive in UE bilaterally; Dr Daylene Katayama   Peripheral vascular disease ([place])    ICA bilat, Dr.Charles fields, [facility]  Pneumonia    2017   PONV (postoperative nausea and vomiting)    "Inner ear, does  okay with Scopolamine"   Pulmonary embolus ([place])    Rectocele    [place] spotted fever     Past Surgical History:  Procedure Laterality Date   ABDOMINAL HYSTERECTOMY  26   age 32 due to dysfuctional menses; HRT x 25-30 years   APPENDECTOMY  1952   basal cell cancer  03/2006   leg   BILATERAL OOPHORECTOMY  1990   prophylactically (sister had ovarian ca)   BRONCHIAL BRUSHINGS  08/30/2019   Procedure: BRONCHIAL BRUSHINGS;  Surgeon: Candee Furbish,  MD;  Location: [facility];  Service: Endoscopy;;   BRONCHIAL WASHINGS  08/30/2019   Procedure: BRONCHIAL WASHINGS;  Surgeon: Candee Furbish, MD;  Location: [facility];  Service: Endoscopy;;   CARPAL TUNNEL RELEASE Bilateral 1989   right   CATARACT EXTRACTION, BILATERAL  2010   Dr Kathrin Penner   CHOLECYSTECTOMY  2006   COLONOSCOPY     Dr Ardis Hughs   EYE SURGERY Left    Bilateral eye (film removed lt eye 01/09/17)   LUMBAR LAMINECTOMY/DECOMPRESSION MICRODISCECTOMY Left 03/20/2017   Procedure: LEFT LUMBAR FOURLUMBAR FIVE LAMINOTOMY AND MICROICRODISCECTOMY 1 LEVEL;  Surgeon: Jovita Gamma, MD;  Location: [place];  Service: Neurosurgery;  Laterality: Left;  LEFT   NM MYOVIEW LTD  06/13/2008   low risk scan   ORTHOPEDIC SURGERY  1989/200/2002/2012   elbows,shoulder surgery x 3, right hand   PACEMAKER IMPLANT N/A 09/06/2019   Procedure: PACEMAKER IMPLANT;  Surgeon: Evans Lance, MD;  Location: [place] [facility];  Service: Cardiovascular;  Laterality: N/A;   RECTOCELE REPAIR  2016   TONSILLECTOMY  1957   US ECHOCARDIOGRAPHY  05/01/2010   trace MR,AI,TR;EF =>55%   VIDEO BRONCHOSCOPY Bilateral 07/17/2017   Procedure: VIDEO BRONCHOSCOPY WITHOUT FLUORO;  Surgeon: Rigoberto Noel, MD;  Location: [facility];  Service: Cardiopulmonary;  Laterality: Bilateral;   VIDEO BRONCHOSCOPY Bilateral 09/02/2017   Procedure: VIDEO BRONCHOSCOPY WITHOUT FLUORO;  Surgeon: Rigoberto Noel, MD;  Location: [facility];  Service: Cardiopulmonary;  Laterality: Bilateral;   VIDEO BRONCHOSCOPY N/A 08/30/2019   Procedure: VIDEO BRONCHOSCOPY WITHOUT FLUORO;  Surgeon: Candee Furbish, MD;  Location: [facility];  Service: Endoscopy;  Laterality: N/A;     reports that she has never smoked. She has never used smokeless tobacco. She reports that she does not drink alcohol and does not use drugs.  Allergies  Allergen Reactions   Aspirin Nausea Only and Other (See Comments)    REACTION: GI upset  ( pt can take 81 mg but NOT    325 mg ASA )   Lovastatin Nausea Only and Other (See Comments)    REACTION: nausea   Benazepril Hcl Other (See Comments)    Unknown   Febuxostat     Other reaction(s): angioedema   Hydrocodone-Acetaminophen     Other reaction(s): irritable   Paroxetine Other (See Comments)    Unknown   Tape Other (See Comments)   Valacyclovir Hcl Nausea And Vomiting   Bupropion Hcl Other (See Comments)    REACTION: tinnitis   Ezetimibe Nausea And Vomiting and Other (See Comments)    REACTION: GI symptoms   Fenofibrate Other (See Comments)    Myalgia    Pravastatin Sodium Other (See Comments)    REACTION: elevated CPK - Muscle's in bilateral Leg    Family History  Problem Relation Age of Onset   Heart attack Father  81   Heart disease Father        Before age 22   Colon cancer Mother 61   Stroke Mother 87   Kidney disease Mother        ? renal calculi; S/P resecton of kidney   Cancer - Colon Mother 80   Ovarian cancer Sister    Other Sister        Valve replacement (aortic & mitral ) in 2 sisters   Heart disease Sister        Before age 60   Heart disease Brother        aortic & mitral valve replacement in 2 bro; both had CBAG   Diabetes Neg Hx    Breast cancer Neg Hx     Prior to Admission medications   Medication Sig Start Date End Date Taking? Authorizing Provider  apixaban (ELIQUIS) 2.5 MG TABS tablet Take 1 tablet (2.5 mg total) by mouth 2 (two) times daily. APPOINTMENT NEEDED FOR FURTHER REFILLS. CONTACT YOUR CARDIOLOGIST Patient taking differently: Take 2.5 mg by mouth 2 (two) times daily. 02/15/21  Yes Croitoru, Mihai, MD  b complex vitamins tablet Take 1 tablet by mouth daily after supper.   Yes [provider]  cholecalciferol (VITAMIN D3) 25 MCG (1000 UNIT) tablet Take 1,000 Units by mouth daily.   Yes [provider]  cyclobenzaprine (FLEXERIL) 10 MG tablet Take 10 mg by mouth 2 (two) times daily as needed for muscle spasms.   Yes [provider]   diclofenac sodium (VOLTAREN) 1 % GEL Apply 2-4 g topically 4 (four) times daily as needed (for pain).    Yes [provider]  flecainide (TAMBOCOR) 50 MG tablet Take 1.5 tablets (75 mg total) by mouth 2 (two) times daily. TAKE 1 TABLET(50 MG) BY MOUTH EVERY 12 HOURS Patient taking differently: Take 75 mg by mouth 2 (two) times daily. 01/23/21  Yes Baldwin Jamaica, PA-C  hydrALAZINE (APRESOLINE) 25 MG tablet Take 1 tablet (25 mg total) by mouth every 8 (eight) hours as needed (if systolic bp more than 629 mmhg). 09/19/20 09/19/21 Yes Shelly Coss, MD  levothyroxine (SYNTHROID) 88 MCG tablet Take 88 mcg by mouth daily before breakfast.   Yes [provider]  LORazepam (ATIVAN) 0.5 MG tablet Take 1 tablet (0.5 mg total) by mouth 3 (three) times daily as needed. Patient taking differently: Take 0.5 mg by mouth 3 (three) times daily as needed for anxiety. 10/17/15  Yes Burns, Claudina Lick, MD  meclizine (ANTIVERT) 25 MG tablet Take 1 tablet (25 mg total) by mouth daily. 1/2 every 8 hrs prn for imbalance Patient taking differently: Take 12.5 mg by mouth 3 (three) times daily as needed (imbalance). 11/01/10  Yes Hendricks Limes, MD  metoprolol succinate (TOPROL-XL) 100 MG 24 hr tablet Take 1 tablet (100 mg total) by mouth daily. Take with or immediately following a meal. 01/23/21 10/20/21 Yes Baldwin Jamaica, PA-C  senna (SENOKOT) 8.6 MG tablet Take 1 tablet by mouth every other day.    Yes [provider]  traMADol (ULTRAM) 50 MG tablet 1/2-1 by mouth once daily as needed. Patient taking differently: Take 25-50 mg by mouth daily as needed for moderate pain. 10/17/15  Yes Burns, Claudina Lick, MD  levothyroxine (SYNTHROID) 75 MCG tablet Take 1 tablet (75 mcg total) by mouth daily before breakfast. Taking the brand "synthroid" Patient not taking: Reported on 03/12/2021 09/07/19   Dessa Phi, DO    Physical Exam: Vitals:  03/13/21 0515 03/13/21 0530 03/13/21 0602 03/13/21 0607  BP:   (!) 167/87  (!) 166/80  Pulse: 68 69  75  Resp: 11 13  12   Temp:   97.7 F (36.5 C)   TempSrc:   Oral   SpO2: 96% 97%  100%    Physical Exam Constitutional:      General: She is not in acute distress. HENT:     Head: Normocephalic and atraumatic.     Mouth/Throat:     Mouth: Mucous membranes are dry.  Eyes:     Extraocular Movements: Extraocular movements intact.     Conjunctiva/sclera: Conjunctivae normal.  Cardiovascular:     Rate and Rhythm: Normal rate and regular rhythm.     Pulses: Normal pulses.  Pulmonary:     Effort: Pulmonary effort is normal. No respiratory distress.     Breath sounds: Normal breath sounds. No wheezing or rales.  Abdominal:     General: Bowel sounds are normal. There is no distension.     Palpations: Abdomen is soft.     Tenderness: There is no abdominal tenderness. There is no guarding or rebound.  Musculoskeletal:        General: No swelling or tenderness.     Cervical back: Normal range of motion and neck supple.  Skin:    General: Skin is warm and dry.  Neurological:     General: No focal deficit present.     Mental Status: She is alert.     Comments: Confused and disoriented.  Only knows her name and the current year.     Labs on Admission: I have personally reviewed following labs and imaging studies  CBC: Recent Labs  Lab 03/12/21 2142  WBC 7.2  NEUTROABS 5.9  HGB 12.1  HCT 38.6  MCV 95.1  PLT 144   Basic Metabolic Panel: Recent Labs  Lab 03/12/21 2142  NA 135  K 4.0  CL 104  CO2 19*  GLUCOSE 109*  BUN 39*  CREATININE 2.44*  CALCIUM 9.3   GFR: CrCl cannot be calculated (Unknown ideal weight.). Liver Function Tests: Recent Labs  Lab 03/12/21 2142  AST 26  ALT 13  ALKPHOS 108  BILITOT 1.3*  PROT 6.7  ALBUMIN 2.9*   No results for input(s): LIPASE, AMYLASE in the last 168 hours. Recent Labs  Lab 03/12/21 2144  AMMONIA 14   Coagulation Profile: Recent Labs  Lab 03/12/21 2142  INR 1.5*   Cardiac  Enzymes: No results for input(s): CKTOTAL, CKMB, CKMBINDEX, TROPONINI in the last 168 hours. BNP (last 3 results) No results for input(s): PROBNP in the last 8760 hours. HbA1C: No results for input(s): HGBA1C in the last 72 hours. CBG: No results for input(s): GLUCAP in the last 168 hours. Lipid Profile: No results for input(s): CHOL, HDL, LDLCALC, TRIG, CHOLHDL, LDLDIRECT in the last 72 hours. Thyroid Function Tests: No results for input(s): TSH, T4TOTAL, FREET4, T3FREE, THYROIDAB in the last 72 hours. Anemia Panel: No results for input(s): VITAMINB12, FOLATE, FERRITIN, TIBC, IRON, RETICCTPCT in the last 72 hours. Urine analysis:    Component Value Date/Time   COLORURINE YELLOW 03/13/2021 0021   APPEARANCEUR TURBID (A) 03/13/2021 0021   LABSPEC 1.020 03/13/2021 0021   PHURINE 6.0 03/13/2021 0021   GLUCOSEU NEGATIVE 03/13/2021 0021   GLUCOSEU NEGATIVE 11/21/2015 1156   HGBUR LARGE (A) 03/13/2021 0021   BILIRUBINUR NEGATIVE 03/13/2021 0021   BILIRUBINUR Neg 09/07/2012 1426   KETONESUR NEGATIVE 03/13/2021 0021   PROTEINUR 30 (A)  03/13/2021 0021   UROBILINOGEN 0.2 11/21/2015 1156   NITRITE NEGATIVE 03/13/2021 0021   LEUKOCYTESUR LARGE (A) 03/13/2021 0021    Radiological Exams on Admission: CT HEAD WO CONTRAST (5MM)  Result Date: 03/12/2021 CLINICAL DATA:  Head trauma. EXAM: CT HEAD WITHOUT CONTRAST TECHNIQUE: Contiguous axial images were obtained from the base of the skull through the vertex without intravenous contrast. COMPARISON:  CT dated 09/18/2020. FINDINGS: Brain: Moderate age-related atrophy and chronic microvascular ischemic changes. There is no acute hemorrhage. No mass effect or midline shift. No extra-axial fluid collection. Vascular: No hyperdense vessel or unexpected calcification. Skull: Normal. Negative for fracture or focal lesion. Sinuses/Orbits: No acute finding. Other: None IMPRESSION: 1. No acute intracranial pathology. 2. Moderate age-related atrophy and chronic  microvascular ischemic changes. Electronically Signed   By: Anner Crete M.D.   On: 03/12/2021 21:59   CT Cervical Spine Wo Contrast  Result Date: 03/12/2021 CLINICAL DATA:  Neck trauma, intoxicated or obtunded (Age >= 16y) ams, fall EXAM: CT CERVICAL SPINE WITHOUT CONTRAST TECHNIQUE: Multidetector CT imaging of the cervical spine was performed without intravenous contrast. Multiplanar CT image reconstructions were also generated. COMPARISON:  None. FINDINGS: Alignment: 2 mm anterolisthesis of C2 on C3 and 2 mm retrolisthesis of C5 on C6, likely degenerative and similar to prior exam. No traumatic subluxation. Skull base and vertebrae: No acute fracture. Vertebral body heights are maintained. The dens and skull base are intact. Soft tissues and spinal canal: No prevertebral fluid or swelling. No visible canal hematoma. Disc levels: Disc space narrowing and endplate spurring at Q7-Y1. There is multilevel facet hypertrophy. Bony ankylosis of C2-C3 facets may be degenerative or congenital. Upper chest: Biapical pleuroparenchymal scarring. Patulous upper esophagus. Other: Carotid calcifications. IMPRESSION: Degenerative change in the cervical spine without acute fracture or subluxation. Electronically Signed   By: Keith Rake M.D.   On: 03/12/2021 22:04   DG Pelvis Portable  Result Date: 03/12/2021 CLINICAL DATA:  Fall, left hip bruising. EXAM: PORTABLE PELVIS 1-2 VIEWS COMPARISON:  Pelvis and left hip radiograph 03/10/2021 FINDINGS: Cortical irregularity is again seen involving the left greater trochanter, more suspicious for fracture on the current exam than on prior. No other fracture. Femoral heads are well seated. Pubic rami are intact. Pubic symphysis and sacroiliac joints are congruent. The bones are diffusely under mineralized. IMPRESSION: Cortical irregularity of the left greater trochanter. This was seen on prior exam, however currently is more suspicious for fracture than on previous.  Electronically Signed   By: Keith Rake M.D.   On: 03/12/2021 21:23   CT Hip Left Wo Contrast  Result Date: 03/12/2021 CLINICAL DATA:  Pain, fall. EXAM: CT OF THE LEFT HIP WITHOUT CONTRAST TECHNIQUE: Multidetector CT imaging of the left hip was performed according to the standard protocol. Multiplanar CT image reconstructions were also generated. COMPARISON:  Radiograph earlier today. FINDINGS: Bones/Joint/Cartilage Acute fracture through the left greater trochanter is mildly displaced and comminuted. There is no extension to the intertrochanteric femur or femoral neck. Femoral head is well seated. No significant hip joint effusion. Intact pubic rami and acetabulum. Bone island in the left pubic body. Ligaments Suboptimally assessed by CT. Muscles and Tendons No intramuscular hematoma. Soft tissues Soft tissue edema is seen lateral to the left proximal femur. There is wall thickening of the sigmoid colon and rectum with mild pericolonic edema. Air-fluid level in the urinary bladder without bladder wall thickening. IMPRESSION: 1. Acute mildly displaced and comminuted fracture through the left greater trochanter. No extension to the intertrochanteric  femur or femoral neck. 2. Air-fluid level in the urinary bladder without bladder wall thickening, likely due to recent instrumentation. 3. Wall thickening of the sigmoid colon and rectum with mild pericolonic edema, suspicious for colitis/proctitis. Recommend correlation for any GI symptoms. Electronically Signed   By: Keith Rake M.D.   On: 03/12/2021 23:22   DG Chest Portable 1 View  Result Date: 03/12/2021 CLINICAL DATA:  Post fall.  Altered mental status. EXAM: PORTABLE CHEST 1 VIEW COMPARISON:  Radiograph 01/23/2021. FINDINGS: Left-sided pacemaker in place. The heart is normal in size. Stable mediastinal contours with aortic atherosclerosis. Biapical pleuroparenchymal scarring. No acute airspace disease. No pneumothorax, pulmonary edema, or pleural  effusion. Skin fold projects over the left hemithorax. The bones are under mineralized. No acute osseous abnormalities are seen. IMPRESSION: No acute abnormality. Electronically Signed   By: Keith Rake M.D.   On: 03/12/2021 21:18    EKG: Independently reviewed.  Sinus rhythm, LVH, QTC 505.  Assessment/Plan Principal Problem:   Hip fracture (HCC) Active Problems:   PAF (paroxysmal atrial fibrillation) (HCC)   AKI (acute kidney injury) ([place])   UTI (urinary tract infection)   Colitis   Left hip fracture In setting of multiple falls. CT of left hip showing acute mildly displaced and comminuted fracture through the left greater trochanter. -Orthopedics consulted by ED physician.  Keep n.p.o. Bedrest.  Pain management.  UTI UA with large amount of leukocytes, greater than 50 WBCs, and many bacteria. Slightly hypothermic initially and temperature now improved.  No other signs of sepsis. -Ceftriaxone.  Urine culture pending.  Colitis/proctitis CT showing wall thickening of the sigmoid colon and rectum with mild pericolonic edema suspicious for colitis/proctitis.  No signs of sepsis. -Ceftriaxone and metronidazole  AKI on CKD stage IIIb Likely prerenal from dehydration.  BUN 39, creatinine 2.4 (was 1.9 last month but baseline around 1.4). -Gentle IV fluid hydration.  Monitor renal function and urine output.  Avoid nephrotoxic agents.  Mild normal anion gap metabolic acidosis Likely secondary to AKI. -Gentle IV fluid hydration, continue to monitor  Acute metabolic encephalopathy Likely multifactorial from dehydration and UTI/infection. Blood ethanol level undetectable.  Ammonia normal.  Head CT negative for acute finding and neuro exam nonfocal. -Gentle IV fluid hydration and antibiotics.  Check B12 level.  Multiple falls/ ?syncope EKG showing sinus rhythm.  Troponin negative.  PE less likely given no hypoxia or tachycardia. -Cardiac monitoring.  Check CK level.  Syncope work-up  ordered including echocardiogram, orthostatics, EEG, and carotid Dopplers.  Check D-dimer level.  TOC consulted for nursing home placement.  Paroxysmal A. Fib Currently in sinus rhythm. -Hold Eliquis at this time.  Continue flecainide and metoprolol.  EKG showing slight QT prolongation.  Will not stop flecainide abruptly, consider discussing with cardiology in the morning.  Avoid other QT prolonging drugs and monitor electrolytes.  Tachybrady syndrome -Status post PPM  Hypothyroidism TSH elevated on labs done last month and dose of Synthroid was increased. -Continue Synthroid  QT prolongation -Cardiac monitoring.  Monitor potassium and magnesium levels.  She is on flecainide for A. fib.  Avoid other QT prolonging drugs.  DVT prophylaxis: No anticoagulation in anticipation of surgery for hip fracture.  No SCDs at this time as D-dimer is pending. Code Status: DNR-discussed with the patient's daughter. Family Communication: Daughter updated. Disposition Plan: Status is: Inpatient  Remains inpatient appropriate because:Altered mental status, Ongoing diagnostic testing needed not appropriate for outpatient work up, and Inpatient level of care appropriate due to severity of illness  Dispo: The patient is from: Home              Anticipated d/c is to: SNF              Patient currently is not medically stable to d/c.   Difficult to place patient No  Level of care: Level of care: Telemetry Cardiac  The medical decision making on this patient was of high complexity and the patient is at high risk for clinical deterioration, therefore this is a level 3 visit.  Shela Leff MD [facility]  If 7PM-7AM, please contact night-coverage [email or web address]  03/13/2021, 6:26 AM

## 2021-03-14 DIAGNOSIS — I129 Hypertensive chronic kidney disease with stage 1 through stage 4 chronic kidney disease, or unspecified chronic kidney disease: Secondary | ICD-10-CM | POA: Diagnosis not present

## 2021-03-14 DIAGNOSIS — J449 Chronic obstructive pulmonary disease, unspecified: Secondary | ICD-10-CM | POA: Diagnosis not present

## 2021-03-14 DIAGNOSIS — E43 Unspecified severe protein-calorie malnutrition: Secondary | ICD-10-CM | POA: Insufficient documentation

## 2021-03-14 DIAGNOSIS — E039 Hypothyroidism, unspecified: Secondary | ICD-10-CM | POA: Diagnosis not present

## 2021-03-14 DIAGNOSIS — N184 Chronic kidney disease, stage 4 (severe): Secondary | ICD-10-CM | POA: Diagnosis not present

## 2021-03-14 DIAGNOSIS — S72115A Nondisplaced fracture of greater trochanter of left femur, initial encounter for closed fracture: Secondary | ICD-10-CM | POA: Diagnosis not present

## 2021-03-14 DIAGNOSIS — I480 Paroxysmal atrial fibrillation: Secondary | ICD-10-CM | POA: Diagnosis not present

## 2021-03-14 DIAGNOSIS — E7800 Pure hypercholesterolemia, unspecified: Secondary | ICD-10-CM | POA: Diagnosis not present

## 2021-03-14 DIAGNOSIS — L8990 Pressure ulcer of unspecified site, unspecified stage: Secondary | ICD-10-CM | POA: Insufficient documentation

## 2021-03-14 LAB — CBC
HCT: 34.7 % — ABNORMAL LOW (ref 36.0–46.0)
Hemoglobin: 11.2 g/dL — ABNORMAL LOW (ref 12.0–15.0)
MCH: 29.8 pg (ref 26.0–34.0)
MCHC: 32.3 g/dL (ref 30.0–36.0)
MCV: 92.3 fL (ref 80.0–100.0)
Platelets: 225 10*3/uL (ref 150–400)
RBC: 3.76 MIL/uL — ABNORMAL LOW (ref 3.87–5.11)
RDW: 15.9 % — ABNORMAL HIGH (ref 11.5–15.5)
WBC: 8.2 10*3/uL (ref 4.0–10.5)
nRBC: 0 % (ref 0.0–0.2)

## 2021-03-14 LAB — BASIC METABOLIC PANEL
Anion gap: 11 (ref 5–15)
BUN: 39 mg/dL — ABNORMAL HIGH (ref 8–23)
CO2: 20 mmol/L — ABNORMAL LOW (ref 22–32)
Calcium: 8.5 mg/dL — ABNORMAL LOW (ref 8.9–10.3)
Chloride: 107 mmol/L (ref 98–111)
Creatinine, Ser: 2.29 mg/dL — ABNORMAL HIGH (ref 0.44–1.00)
GFR, Estimated: 21 mL/min — ABNORMAL LOW (ref >60)
Glucose, Bld: 89 mg/dL (ref 70–99)
Potassium: 3.6 mmol/L (ref 3.5–5.1)
Sodium: 138 mmol/L (ref 135–145)

## 2021-03-14 MED ORDER — SODIUM CHLORIDE 0.9 % IV SOLN: INTRAVENOUS | Status: DC

## 2021-03-14 MED ORDER — LORAZEPAM 0.5 MG PO TABS
0.5000 mg | ORAL_TABLET | Freq: Two times a day (BID) | ORAL | Status: DC | PRN
  Administered 2021-03-14: 0.5 mg via ORAL
  Filled 2021-03-14: qty 1

## 2021-03-14 MED ORDER — ENSURE ENLIVE PO LIQD
237.0000 mL | Freq: Two times a day (BID) | ORAL | Status: DC
  Administered 2021-03-14 – 2021-03-19 (×5): 237 mL via ORAL

## 2021-03-14 NOTE — NC FL2 (Signed)
[place] LEVEL OF CARE SCREENING TOOL     IDENTIFICATION  Patient Name: Deborah Shields Birthdate: [birth year] Sex: female Admission Date (Current Location): 03/12/2021  [facility] and [place] Number:  Herbalist and Address:  [facility], [place] [address] [place], [place] [address]      Provider Number: [id number]  Attending Physician Name and Address:  Antonieta Pert, MD  Relative Name and Phone Number:  [name] (Daughter)   [phone]    Current Level of Care: Hospital Recommended Level of Care: [place] Prior Approval Number:    Date Approved/Denied:   PASRR Number: Pending  Discharge Plan: SNF    Current Diagnoses: Patient Active Problem List   Diagnosis Date Noted   Pressure injury of skin 03/14/2021   UTI (urinary tract infection) 03/13/2021   Colitis 03/13/2021   Closed nondisplaced fracture of greater trochanter of left femur ([place])    AKI (acute kidney injury) ([place]) 01/23/2021   Hypertension, accelerated with heart disease, without CHF 09/19/2020   Atypical chest pain 09/18/2020   SSS (sick sinus syndrome) ([place]) 12/06/2019   Pacemaker 12/06/2019   Hypercholesterolemia 12/06/2019   Underweight 12/06/2019   Pneumonia of right upper lobe due to methicillin resistant Staphylococcus aureus (MRSA) ([place])    Pneumonia due to infectious organism    Community acquired pneumonia 08/16/2018   Malnutrition of moderate degree 07/16/2017   Hemoptysis 07/15/2017   HNP (herniated nucleus pulposus), lumbar 03/20/2017   Edema of both feet 12/10/2016   Tick bite 11/21/2015   Abnormal urine odor 11/21/2015   Other fatigue 11/21/2015   Anxiety 08/15/2015   Encounter for pre-operative cardiovascular clearance 07/10/2015   Pre-operative clearance 07/07/2015   Rotator cuff tear 07/07/2015   Rectocele, female 08/04/2014   COPD with chronic bronchitis and emphysema ([place]) 02/14/2014   Other malaise and fatigue  12/31/2013   PAF (paroxysmal atrial fibrillation) ([place]) 01/10/2013   Thrombosed external hemorrhoid 11/26/2012   Chronic renal insufficiency, stage III (moderate) ([place]) 09/03/2011   Atherosclerosis of native arteries of the extremities with intermittent claudication 04/04/2011   Occlusion and stenosis of carotid artery without mention of cerebral infarction 04/04/2011   PERIPHERAL NEUROPATHY 08/15/2008   Hypothyroidism (acquired) 08/12/2007   Hyperlipidemia with target LDL less than 100 08/12/2007   Essential hypertension 08/12/2007   Osteopenia 08/12/2007   Carotid artery stenosis, asymptomatic 08/11/2007   Osteoarthritis 08/11/2007   BASAL CELL CARCINOMA, HX OF 08/11/2007   FIBROCYSTIC BREAST DISEASE 11/18/2006   DIVERTICULOSIS, COLON 08/10/2006    Orientation RESPIRATION BLADDER Height & Weight     Place, Self  Normal Incontinent, External catheter Weight:   Height:     BEHAVIORAL SYMPTOMS/MOOD NEUROLOGICAL BOWEL NUTRITION STATUS      Incontinent Diet (See DC Summary)  AMBULATORY STATUS COMMUNICATION OF NEEDS Skin   Limited Assist Verbally Skin abrasions (Pressure Injury)                       Personal Care Assistance Level of Assistance  Bathing, Feeding, Dressing Bathing Assistance: Limited assistance Feeding assistance: Independent Dressing Assistance: Limited assistance     Functional Limitations Info  Sight, Hearing, Speech Sight Info: Adequate Hearing Info: Adequate Speech Info: Adequate    SPECIAL CARE FACTORS FREQUENCY  PT (By licensed PT), OT (By licensed OT)     PT Frequency: 5x a week OT Frequency: 5x a week            Contractures Contractures  Info: Not present    Additional Factors Info  Code Status, Allergies Code Status Info: DNR Allergies Info: Aspirin   Lovastatin   Benazepril Hcl   Febuxostat   Hydrocodone-acetaminophen   Paroxetine   Tape   Valacyclovir Hcl   Bupropion Hcl   Ezetimibe   Fenofibrate   Pravastatin Sodium            Current Medications (03/14/2021):  This is the current hospital active medication list Current Facility-Administered Medications  Medication Dose Route Frequency Provider Last Rate Last Admin   0.9 %  sodium chloride infusion   Intravenous Continuous Kc, Ramesh, MD 75 mL/hr at 03/14/21 0917 New Bag at 03/14/21 0917   apixaban (ELIQUIS) tablet 2.5 mg  2.5 mg Oral BID Kc, Ramesh, MD   2.5 mg at 03/14/21 0908   cefTRIAXone (ROCEPHIN) 1 g in sodium chloride 0.9 % 100 mL IVPB  1 g Intravenous Q0600 Shela Leff, MD 200 mL/hr at 03/14/21 0543 1 g at 03/14/21 0543   feeding supplement (ENSURE ENLIVE / ENSURE PLUS) liquid 237 mL  237 mL Oral BID BM Kc, Ramesh, MD       flecainide (TAMBOCOR) tablet 75 mg  75 mg Oral BID Shela Leff, MD   75 mg at 03/14/21 0906   HYDROcodone-acetaminophen (NORCO/VICODIN) 5-325 MG per tablet 1-2 tablet  1-2 tablet Oral Q6H PRN Shela Leff, MD   1 tablet at 03/14/21 0545   levothyroxine (SYNTHROID) tablet 88 mcg  88 mcg Oral QAC breakfast Shela Leff, MD   88 mcg at 03/14/21 0539   metoprolol succinate (TOPROL-XL) 24 hr tablet 100 mg  100 mg Oral Daily Shela Leff, MD   100 mg at 03/14/21 1751   metroNIDAZOLE (FLAGYL) IVPB 500 mg  500 mg Intravenous Q12H Shela Leff, MD 100 mL/hr at 03/14/21 0630 500 mg at 03/14/21 0630   morphine 2 MG/ML injection 0.5 mg  0.5 mg Intravenous Q2H PRN Shela Leff, MD         Discharge Medications: Please see discharge summary for a list of discharge medications.  Relevant Imaging Results:  Relevant Lab Results:   Additional Information SSN: [id number]  Reece Agar, LCSWA

## 2021-03-14 NOTE — Progress Notes (Signed)
Initial Nutrition Assessment  DOCUMENTATION CODES:   Underweight, Severe malnutrition in context of chronic illness  INTERVENTION:   - Please obtain admission weight  - Ensure Enlive po BID, each supplement provides 350 kcal and 20 grams of protein  - Agree with Regular diet order  - Encourage PO intake  NUTRITION DIAGNOSIS:   Severe Malnutrition related to chronic illness (CKD) as evidenced by severe muscle depletion, severe fat depletion.  GOAL:   Patient will meet greater than or equal to 90% of their needs  MONITOR:   PO intake, Supplement acceptance, Labs, Weight trends, Skin  REASON FOR ASSESSMENT:   Consult Hip fracture protocol  ASSESSMENT:   80 year old female who presented to the ED on 9/19 with AMS. PMH of atrial fibrillation, CKD stage IIIb, HLD, HTN, hypothyroidism, PVD, PE, anemia, carotid stenosis. Pt found to have L hip fracture, UTI, AKI, proctitis/colitis.  Per Orthopedics note, plan is for non-operative management of L hip fx.  Pt on a regular diet with no meal completions charted.  Noted Palliative consult pending regarding [place].  Spoke with pt and daughter at beside. Pt with sweet tea, powdered donut, and applesauce on bedside tray. Pt reports that she ate some [place] toast from her breakfast meal tray and one powdered donut and is finished. Pt's daughter reports pt actually did not eat any [place] toast from her breakfast meal tray.  Pt reports that her appetite is variable. Pt's daughter reports pt has a poor appetite at baseline. Pt states that she eats intermittently throughout the day and sometimes has 2 meals but other times has 3 meals. Pt's daughter indicates that pt has 0-1 meals daily. 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 Pt states that she does her own grocery shopping and most of her own cooking but has been eating out more lately.  Pt is aware that she has lost weight  recently. She states that her UBW is 140 lbs but that she last weighed this "a long time ago." She states that her doctor recently told her to consume Ensure or Boost supplements. Reviewed weight history in chart. Pt with a 2.2 kg weight loss since 09/18/20. This is a 4.6% weight loss in 6 months which is not significant for timeframe.  Discussed with pt and daughter the importance of adequate PO intake in healing and maintaining lean muscle mass. Encouraged pt eat more at meals and not to skip meals. Pt willing to drink Ensure supplements during admission and after discharge. RD provided pt with a chocolate Ensure Plus High Protein at time of visit.  Pt denies any nausea or vomiting. She denies any diarrhea but reports taking senokot every other day for constipation at home.  Medications reviewed and include: IV abx IVF: NS @ 75 ml/hr  Labs reviewed: BUN 39, creatinine 2.29  UOP: 700 ml x 12 hours  NUTRITION - FOCUSED PHYSICAL EXAM:  Flowsheet Row Most Recent Value  Orbital Region Severe depletion  Upper Arm Region Severe depletion  Thoracic and Lumbar Region Moderate depletion  Buccal Region Moderate depletion  Temple Region Moderate depletion  Clavicle Bone Region Severe depletion  Clavicle and Acromion Bone Region Severe depletion  Scapular Bone Region Moderate depletion  Dorsal Hand Moderate depletion  Patellar Region Moderate depletion  Anterior Thigh Region Moderate depletion  Posterior Calf Region Moderate depletion  Edema (RD Assessment) None  Hair Reviewed  Eyes Reviewed  Mouth Reviewed  Skin Reviewed  Nails  Reviewed       Diet Order:   Diet Order             Diet regular Room service appropriate? Yes; Fluid consistency: Thin  Diet effective now                   EDUCATION NEEDS:   Education needs have been addressed  Skin:  Skin Assessment: Skin Integrity Issues: Stage II: coccyx  Last BM:  03/13/21 medium type 5  Height:   Ht Readings from Last  1 Encounters:  01/23/21 5\' 6"  (1.676 m)    Weight:   Wt Readings from Last 1 Encounters:  01/23/21 45.4 kg    BMI:  16.16  Estimated Nutritional Needs:   Kcal:  1350-1150  Protein:  60-75 grams  Fluid:  1.3-1.5 L    Gustavus Bryant, MS, RD, LDN Inpatient Clinical Dietitian Please see AMiON for contact information.

## 2021-03-14 NOTE — TOC CAGE-AID Note (Signed)
Transition of Care [facility]) - CAGE-AID Screening   Patient Details  Name: Deborah Shields MRN: [id number] Date of Birth: [birth year]   Elvina Sidle, RN Trauma Response Nurse Phone Number: [phone] 03/14/2021, 7:28 AM    CAGE-AID Screening:    Have You Ever Felt You Ought to Cut Down on Your Drinking or Drug Use?: No Have People Annoyed You By Critizing Your Drinking Or Drug Use?: No Have You Felt Bad Or Guilty About Your Drinking Or Drug Use?: No Have You Ever Had a Drink or Used Drugs First Thing In The Morning to Steady Your Nerves or to Get Rid of a Hangover?: No CAGE-AID Score: 0  Substance Abuse Education Offered: No (denies any alcohol or drug use)

## 2021-03-14 NOTE — Progress Notes (Signed)
PROGRESS NOTE    Deborah Shields  BOF:[id number] DOB: September 13, 1940 DOA: 03/12/2021 PCP: Lajean Manes, MD   Brief Narrative/Hospital Course: 80 year old female with PAF on Eliquis, tachybradycardia syndrome post PPM, CKD 3B, HLD, HTN, hypothyroidism, PVD, history of PE, carotid stenosis, anemia was found on the floor confused hallucinating and crawling by the daughter and brought to the ED for evaluation.  Patient was last normal at her baseline all day earlier in the evening 9/18.   In the ED found to have AKI creatinine up fom 1.9-2.4 previously 1.4 baseline. UA WBC more than 50 suspecting UTI urine culture sent, chest x-ray no acute finding CT head and C-spine negative for acute finding, CT of the left hip acute mildly displaced and comminuted fracture through the left greater trochanter, also with thickening of the sigmoid colon and rectum with mild pericolonic edema suspicious for colitis/proctitis, orthopedic was consulted IV fluid was given, placed on antibiotic and admitted for further management of UTI, left greater Trochanteric fracture, sigmoid colitis/proctitis, AKI  Subjective: More alert awake this am Overnight no fever T-max 99, LABS shows improving renal function Daughter at bedside thinks she is still confused. She thinks she is at [employer]. She says she fell at home I am going home.  Assessment & Plan:  UTI POA: Continue ceftriaxone urine culture pending.    Proctitis/colitis: Noted on the CT abdomen.  Diet as tolerated, continue ceftriaxone/Flagyl.   Left greater trochanteric fracture seen by orthopedic nonoperative management. PT OT eval WBAT and follow-up with Dr. Ninfa Linden in 2 weeks  AKI on CKD 3B Mild anion gap metabolic acidosis due to AKI: Baseline creatinine 1.5 but recently 1.9 on admission 2.4.  Slowly improving keep on gentle IV fluid hydration. Bmp in am Recent Labs  Lab 03/12/21 2142 03/13/21 1603 03/14/21 0034  BUN 39* 40* 39*  CREATININE 2.44*  2.48* 2.29*     Acute metabolic encephalopathy MCI- memory issues some at baseline Multiple falls rule out syncope: Suspecting metabolic encephalopathy, syncope work-up initiated on admission but no witnessed syncope was found on the floor confused.  CK normal, 2D echo normal EF, EEG no seizure.  Work up unrevealing with CT head, B12, ammonia, alcohol level.   PT OT, check orthostatic vitals today.She did have D-dimer done work-up of  syncope that was elevated but no respiratory symptoms chest pain do not suspect VTE- and she is already anticoagulated. Duplex leg neg.mentation improving.  PAF on Eliquis metoprolol and flecainide Tachybradycardia syndrome with pacemaker in place QT prolongation monitor Goal of care DNR , discussed with daughter at the bedside monitor closely elderly at risk of decompensation requires admission. PMT consulted,    Diet Order             Diet regular Room service appropriate? Yes; Fluid consistency: Thin  Diet effective now                  Pressure Injury 03/13/21 Coccyx Medial Stage 2 -  Partial thickness loss of dermis presenting as a shallow open injury with a red, pink wound bed without slough. (Active)  03/13/21 1913  Location: Coccyx  Location Orientation: Medial  Staging: Stage 2 -  Partial thickness loss of dermis presenting as a shallow open injury with a red, pink wound bed without slough.  Wound Description (Comments):   Present on Admission: Yes    DVT prophylaxis: apixaban (ELIQUIS) tablet 2.5 mg Start: 03/13/21 1145 Code Status:   Code Status: DNR  Family Communication: plan of care  discussed with patient and daughter  at bedside.  Status is: Inpatient Remains inpatient appropriate because:IV treatments appropriate due to intensity of illness or inability to take PO and Inpatient level of care appropriate due to severity of illness Dispo: The patient is from: Home              Anticipated d/c is to: SNF              Patient  currently is not medically stable to d/c.   Difficult to place patient No  Objective: Vitals: Today's Vitals   03/14/21 0300 03/14/21 0545 03/14/21 0630 03/14/21 0700  BP: (!) 165/88   (!) 156/84  Pulse: 66 68  65  Resp: 15 15  15   Temp: 98.6 F (37 C)   98 F (36.7 C)  TempSrc: Oral   Oral  SpO2: 90% 96%  100%  PainSc: 0-No pain 5  0-No pain    Examination: General exam: AA to month/sept and president , thinks she is at [employer], on RA-confabulating HEENT:Oral mucosa moist, Ear/Nose WNL grossly,dentition normal. Respiratory system: bilaterally clear, no use of accessory muscle, non tender. Cardiovascular system: S1 & S2 +,No JVD. Gastrointestinal system: Abdomen soft, NT,ND, BS+. Nervous System:Alert, awake, moving extremities Extremities: No edema, distal peripheral pulses palpable.  Skin: No rashes,no icterus. MSK: Normal muscle bulk,tone, power   Intake/Output Summary (Last 24 hours) at 03/14/2021 0835 Last data filed at 03/14/2021 3419 Gross per 24 hour  Intake 240 ml  Output 700 ml  Net -460 ml   There were no vitals filed for this visit. Weight change:    Consultants:see note  Procedures:see note Antimicrobials: Anti-infectives (From admission, onward)    Start     Dose/Rate Route Frequency Ordered Stop   03/13/21 0615  cefTRIAXone (ROCEPHIN) 1 g in sodium chloride 0.9 % 100 mL IVPB        1 g 200 mL/hr over 30 Minutes Intravenous Daily 03/13/21 0608     03/13/21 0615  metroNIDAZOLE (FLAGYL) IVPB 500 mg        500 mg 100 mL/hr over 60 Minutes Intravenous Every 12 hours 03/13/21 0608        Culture/Microbiology    Component Value Date/Time   SDES IN/OUT CATH URINE 03/13/2021 0021   SPECREQUEST NONE 03/13/2021 0021   CULT (A) 03/13/2021 0021    >=100,000 COLONIES/mL ESCHERICHIA COLI SUSCEPTIBILITIES TO FOLLOW Performed at [facility], [address], [place], [place] [address]    REPTSTATUS PENDING 03/13/2021 0021    Other culture-see  note  Unresulted Labs (From admission, onward)     Start     Ordered   03/14/21 7989  Basic metabolic panel  Daily,   R     Question:  Specimen collection method  Answer:  Lab=Lab collect   03/13/21 1134   03/14/21 0500  CBC  Daily,   R     Question:  Specimen collection method  Answer:  Lab=Lab collect   03/13/21 1134          Medications reviewed:  Scheduled Meds:  apixaban  2.5 mg Oral BID   flecainide  75 mg Oral BID   levothyroxine  88 mcg Oral QAC breakfast   metoprolol succinate  100 mg Oral Daily   Continuous Infusions:  sodium chloride     cefTRIAXone (ROCEPHIN)  IV 1 g (03/14/21 0543)   metronidazole 500 mg (03/14/21 0630)     Intake/Output from previous day: 09/20 0701 - 09/21  0700 In: 440 [P.O.:240; IV Piggyback:200] Out: 700 [Urine:700] Intake/Output this shift: No intake/output data recorded. There were no vitals filed for this visit. Data Reviewed: I have personally reviewed following labs and imaging studies CBC: Recent Labs  Lab 03/12/21 2142 03/14/21 0034  WBC 7.2 8.2  NEUTROABS 5.9  --   HGB 12.1 11.2*  HCT 38.6 34.7*  MCV 95.1 92.3  PLT 202 502   Basic Metabolic Panel: Recent Labs  Lab 03/12/21 2142 03/13/21 0605 03/13/21 1603 03/14/21 0034  NA 135  --  137 138  K 4.0  --  3.9 3.6  CL 104  --  106 107  CO2 19*  --  20* 20*  GLUCOSE 109*  --  98 89  BUN 39*  --  40* 39*  CREATININE 2.44*  --  2.48* 2.29*  CALCIUM 9.3  --  8.3* 8.5*  MG  --  2.1  --   --    GFR: CrCl cannot be calculated (Unknown ideal weight.). Liver Function Tests: Recent Labs  Lab 03/12/21 2142  AST 26  ALT 13  ALKPHOS 108  BILITOT 1.3*  PROT 6.7  ALBUMIN 2.9*   No results for input(s): LIPASE, AMYLASE in the last 168 hours. Recent Labs  Lab 03/12/21 2144  AMMONIA 14   Coagulation Profile: Recent Labs  Lab 03/12/21 2142  INR 1.5*   Cardiac Enzymes: Recent Labs  Lab 03/13/21 0605  CKTOTAL 120   BNP (last 3 results) No results for  input(s): PROBNP in the last 8760 hours. HbA1C: No results for input(s): HGBA1C in the last 72 hours. CBG: No results for input(s): GLUCAP in the last 168 hours. Lipid Profile: No results for input(s): CHOL, HDL, LDLCALC, TRIG, CHOLHDL, LDLDIRECT in the last 72 hours. Thyroid Function Tests: No results for input(s): TSH, T4TOTAL, FREET4, T3FREE, THYROIDAB in the last 72 hours. Anemia Panel: Recent Labs    03/13/21 0605  VITAMINB12 253   Sepsis Labs: No results for input(s): PROCALCITON, LATICACIDVEN in the last 168 hours.  Recent Results (from the past 240 hour(s))  Resp Panel by RT-PCR (Flu A&B, Covid) Nasopharyngeal Swab     Status: None   Collection Time: 03/12/21  8:51 PM   Specimen: Nasopharyngeal Swab; Nasopharyngeal(NP) swabs in vial transport medium  Result Value Ref Range Status   SARS Coronavirus 2 by RT PCR NEGATIVE NEGATIVE Final    Comment: (NOTE) SARS-CoV-2 target nucleic acids are NOT DETECTED.  The SARS-CoV-2 RNA is generally detectable in upper respiratory specimens during the acute phase of infection. The lowest concentration of SARS-CoV-2 viral copies this assay can detect is 138 copies/mL. A negative result does not preclude SARS-Cov-2 infection and should not be used as the sole basis for treatment or other patient management decisions. A negative result may occur with  improper specimen collection/handling, submission of specimen other than nasopharyngeal swab, presence of viral mutation(s) within the areas targeted by this assay, and inadequate number of viral copies(<138 copies/mL). A negative result must be combined with clinical observations, patient history, and epidemiological information. The expected result is Negative.  Fact Sheet for Patients:  EntrepreneurPulse.com.au  Fact Sheet for Healthcare Providers:  IncredibleEmployment.be  This test is no t yet approved or cleared by the [place] FDA and   has been authorized for detection and/or diagnosis of SARS-CoV-2 by FDA under an Emergency Use Authorization (EUA). This EUA will remain  in effect (meaning this test can be used) for the duration of the COVID-19 declaration under  Section 564(b)(1) of the Act, 21 U.S.C.section 360bbb-3(b)(1), unless the authorization is terminated  or revoked sooner.       Influenza A by PCR NEGATIVE NEGATIVE Final   Influenza B by PCR NEGATIVE NEGATIVE Final    Comment: (NOTE) The Xpert Xpress SARS-CoV-2/FLU/RSV plus assay is intended as an aid in the diagnosis of influenza from Nasopharyngeal swab specimens and should not be used as a sole basis for treatment. Nasal washings and aspirates are unacceptable for Xpert Xpress SARS-CoV-2/FLU/RSV testing.  Fact Sheet for Patients: EntrepreneurPulse.com.au  Fact Sheet for Healthcare Providers: IncredibleEmployment.be  This test is not yet approved or cleared by the [place] FDA and has been authorized for detection and/or diagnosis of SARS-CoV-2 by FDA under an Emergency Use Authorization (EUA). This EUA will remain in effect (meaning this test can be used) for the duration of the COVID-19 declaration under Section 564(b)(1) of the Act, 21 U.S.C. section 360bbb-3(b)(1), unless the authorization is terminated or revoked.  Performed at [facility], [place] [address], [place], [place] [address]   Urine Culture     Status: Abnormal (Preliminary result)   Collection Time: 03/13/21 12:21 AM   Specimen: In/Out Cath Urine  Result Value Ref Range Status   Specimen Description IN/OUT CATH URINE  Final   Special Requests NONE  Final   Culture (A)  Final    >=100,000 COLONIES/mL ESCHERICHIA COLI SUSCEPTIBILITIES TO FOLLOW Performed at [facility], [place] [address], [place], [place] [address]    Report Status PENDING  Incomplete     Radiology Studies: CT HEAD WO CONTRAST (5MM)  Result Date:  03/12/2021 CLINICAL DATA:  Head trauma. EXAM: CT HEAD WITHOUT CONTRAST TECHNIQUE: Contiguous axial images were obtained from the base of the skull through the vertex without intravenous contrast. COMPARISON:  CT dated 09/18/2020. FINDINGS: Brain: Moderate age-related atrophy and chronic microvascular ischemic changes. There is no acute hemorrhage. No mass effect or midline shift. No extra-axial fluid collection. Vascular: No hyperdense vessel or unexpected calcification. Skull: Normal. Negative for fracture or focal lesion. Sinuses/Orbits: No acute finding. Other: None IMPRESSION: 1. No acute intracranial pathology. 2. Moderate age-related atrophy and chronic microvascular ischemic changes. Electronically Signed   By: Anner Crete M.D.   On: 03/12/2021 21:59   CT Cervical Spine Wo Contrast  Result Date: 03/12/2021 CLINICAL DATA:  Neck trauma, intoxicated or obtunded (Age >= 16y) ams, fall EXAM: CT CERVICAL SPINE WITHOUT CONTRAST TECHNIQUE: Multidetector CT imaging of the cervical spine was performed without intravenous contrast. Multiplanar CT image reconstructions were also generated. COMPARISON:  None. FINDINGS: Alignment: 2 mm anterolisthesis of C2 on C3 and 2 mm retrolisthesis of C5 on C6, likely degenerative and similar to prior exam. No traumatic subluxation. Skull base and vertebrae: No acute fracture. Vertebral body heights are maintained. The dens and skull base are intact. Soft tissues and spinal canal: No prevertebral fluid or swelling. No visible canal hematoma. Disc levels: Disc space narrowing and endplate spurring at R5-J8. There is multilevel facet hypertrophy. Bony ankylosis of C2-C3 facets may be degenerative or congenital. Upper chest: Biapical pleuroparenchymal scarring. Patulous upper esophagus. Other: Carotid calcifications. IMPRESSION: Degenerative change in the cervical spine without acute fracture or subluxation. Electronically Signed   By: Keith Rake M.D.   On: 03/12/2021  22:04   DG Pelvis Portable  Result Date: 03/12/2021 CLINICAL DATA:  Fall, left hip bruising. EXAM: PORTABLE PELVIS 1-2 VIEWS COMPARISON:  Pelvis and left hip radiograph 03/10/2021 FINDINGS: Cortical irregularity is again seen  involving the left greater trochanter, more suspicious for fracture on the current exam than on prior. No other fracture. Femoral heads are well seated. Pubic rami are intact. Pubic symphysis and sacroiliac joints are congruent. The bones are diffusely under mineralized. IMPRESSION: Cortical irregularity of the left greater trochanter. This was seen on prior exam, however currently is more suspicious for fracture than on previous. Electronically Signed   By: Keith Rake M.D.   On: 03/12/2021 21:23   CT Hip Left Wo Contrast  Result Date: 03/12/2021 CLINICAL DATA:  Pain, fall. EXAM: CT OF THE LEFT HIP WITHOUT CONTRAST TECHNIQUE: Multidetector CT imaging of the left hip was performed according to the standard protocol. Multiplanar CT image reconstructions were also generated. COMPARISON:  Radiograph earlier today. FINDINGS: Bones/Joint/Cartilage Acute fracture through the left greater trochanter is mildly displaced and comminuted. There is no extension to the intertrochanteric femur or femoral neck. Femoral head is well seated. No significant hip joint effusion. Intact pubic rami and acetabulum. Bone island in the left pubic body. Ligaments Suboptimally assessed by CT. Muscles and Tendons No intramuscular hematoma. Soft tissues Soft tissue edema is seen lateral to the left proximal femur. There is wall thickening of the sigmoid colon and rectum with mild pericolonic edema. Air-fluid level in the urinary bladder without bladder wall thickening. IMPRESSION: 1. Acute mildly displaced and comminuted fracture through the left greater trochanter. No extension to the intertrochanteric femur or femoral neck. 2. Air-fluid level in the urinary bladder without bladder wall thickening, likely due  to recent instrumentation. 3. Wall thickening of the sigmoid colon and rectum with mild pericolonic edema, suspicious for colitis/proctitis. Recommend correlation for any GI symptoms. Electronically Signed   By: Keith Rake M.D.   On: 03/12/2021 23:22   DG Chest Portable 1 View  Result Date: 03/12/2021 CLINICAL DATA:  Post fall.  Altered mental status. EXAM: PORTABLE CHEST 1 VIEW COMPARISON:  Radiograph 01/23/2021. FINDINGS: Left-sided pacemaker in place. The heart is normal in size. Stable mediastinal contours with aortic atherosclerosis. Biapical pleuroparenchymal scarring. No acute airspace disease. No pneumothorax, pulmonary edema, or pleural effusion. Skin fold projects over the left hemithorax. The bones are under mineralized. No acute osseous abnormalities are seen. IMPRESSION: No acute abnormality. Electronically Signed   By: Keith Rake M.D.   On: 03/12/2021 21:18   EEG adult  Result Date: 03/13/2021 Lora Havens, MD     03/13/2021 12:18 PM Patient Name: Aaralynn Shepheard MRN: [id number] Epilepsy Attending: Lora Havens Referring Physician/Provider: Dr Derrick Ravel Date: 03/13/2021 Duration: 22.45 mins Patient history: 80yo F with ams and syncope. EEG to evaluate for seizure. Level of alertness: Awake,asleep AEDs during EEG study: None Technical aspects: This EEG study was done with scalp electrodes positioned according to the 10-20 International system of electrode placement. Electrical activity was acquired at a sampling rate of 500Hz  and reviewed with a high frequency filter of 70Hz  and a low frequency filter of 1Hz . EEG data were recorded continuously and digitally stored. Description: The posterior dominant rhythm consists of 8-9 Hz activity of moderate voltage (25-35 uV) seen predominantly in posterior head regions, symmetric and reactive to eye opening and eye closing. Sleep was characterized by vertex waves, sleep spindles (12 to 14 Hz), maximal frontocentral region.  Hyperventilation and photic stimulation were not performed.   IMPRESSION: This study is within normal limits. No seizures or epileptiform discharges were seen throughout the recording. Lora Havens   ECHOCARDIOGRAM COMPLETE  Result Date: 03/13/2021    ECHOCARDIOGRAM  REPORT   Patient Name:   Deborah Shields Date of Exam: 03/13/2021 Medical Rec #:  [id number]            Height:       66.0 in Accession #:    [id number]           Weight:       100.0 lb Date of Birth:  1941-02-25            BSA:          1.490 m Patient Age:    61 years             BP:           140/81 mmHg Patient Gender: F                    HR:           77 bpm. Exam Location:  Inpatient Procedure: 2D Echo, Cardiac Doppler and Color Doppler Indications:    R55 Syncope  History:        Patient has prior history of Echocardiogram examinations, most                 recent 09/14/2020. Abnormal ECG, COPD, Arrythmias:Atrial                 Fibrillation, Signs/Symptoms:Chest Pain; Risk                 Factors:Hypertension and Dyslipidemia.  Sonographer:    Roseanna Rainbow RDCS Referring Phys: [id number] [facility]  Sonographer Comments: Technically difficult study due to poor echo windows. Rushed for productivity and called back to OR 17. IMPRESSIONS  1. Left ventricular ejection fraction, by estimation, is 60 to 65%. The left ventricle has normal function. The left ventricle has no regional wall motion abnormalities. Left ventricular diastolic parameters are indeterminate.  2. Right ventricular systolic function is normal. The right ventricular size is normal. There is normal pulmonary artery systolic pressure.  3. The mitral valve is normal in structure. Trivial mitral valve regurgitation. No evidence of mitral stenosis.  4. The aortic valve is calcified. There is moderate calcification of the aortic valve. There is moderate thickening of the aortic valve. Aortic valve regurgitation is mild. No aortic stenosis is present. Aortic valve area, by VTI  measures 1.46 cm. Aortic valve mean gradient measures 9.0 mmHg. Aortic valve Vmax measures 2.06 m/s.  5. The inferior vena cava is normal in size with greater than 50% respiratory variability, suggesting right atrial pressure of 3 mmHg. FINDINGS  Left Ventricle: Left ventricular ejection fraction, by estimation, is 60 to 65%. The left ventricle has normal function. The left ventricle has no regional wall motion abnormalities. The left ventricular internal cavity size was normal in size. There is  no left ventricular hypertrophy. Left ventricular diastolic parameters are indeterminate. Normal left ventricular filling pressure. Right Ventricle: The right ventricular size is normal. No increase in right ventricular wall thickness. Right ventricular systolic function is normal. There is normal pulmonary artery systolic pressure. The tricuspid regurgitant velocity is 2.51 m/s, and  with an assumed right atrial pressure of 8 mmHg, the estimated right ventricular systolic pressure is 54.6 mmHg. Left Atrium: Left atrial size was normal in size. Right Atrium: Right atrial size was normal in size. Pericardium: There is no evidence of pericardial effusion. Mitral Valve: The mitral valve is normal in structure. Trivial mitral valve regurgitation. No evidence of mitral valve stenosis. Tricuspid Valve: The tricuspid  valve is normal in structure. Tricuspid valve regurgitation is mild . No evidence of tricuspid stenosis. Aortic Valve: The aortic valve is calcified. There is moderate calcification of the aortic valve. There is moderate thickening of the aortic valve. Aortic valve regurgitation is mild. No aortic stenosis is present. Aortic valve mean gradient measures 9.0  mmHg. Aortic valve peak gradient measures 17.0 mmHg. Aortic valve area, by VTI measures 1.46 cm. Pulmonic Valve: The pulmonic valve was normal in structure. Pulmonic valve regurgitation is not visualized. No evidence of pulmonic stenosis. Aorta: The aortic root  is normal in size and structure. Venous: The inferior vena cava is normal in size with greater than 50% respiratory variability, suggesting right atrial pressure of 3 mmHg. IAS/Shunts: No atrial level shunt detected by color flow Doppler.  LEFT VENTRICLE PLAX 2D LVIDd:         3.60 cm     Diastology LVIDs:         2.00 cm     LV e' medial:    4.68 cm/s LV PW:         0.90 cm     LV E/e' medial:  15.2 LV IVS:        0.90 cm     LV e' lateral:   7.40 cm/s LVOT diam:     1.80 cm     LV E/e' lateral: 9.6 LV SV:         58 LV SV Index:   39 LVOT Area:     2.54 cm  LV Volumes (MOD) LV vol d, MOD A2C: 40.4 ml LV vol d, MOD A4C: 38.2 ml LV vol s, MOD A2C: 15.9 ml LV vol s, MOD A4C: 11.6 ml LV SV MOD A2C:     24.5 ml LV SV MOD A4C:     38.2 ml LV SV MOD BP:      27.2 ml RIGHT VENTRICLE            IVC RV S prime:     9.68 cm/s  IVC diam: 0.90 cm TAPSE (M-mode): 1.4 cm LEFT ATRIUM           Index       RIGHT ATRIUM          Index LA diam:      2.70 cm 1.81 cm/m  RA Area:     8.31 cm LA Vol (A2C): 15.1 ml 10.14 ml/m RA Volume:   18.30 ml 12.29 ml/m LA Vol (A4C): 20.6 ml 13.83 ml/m  AORTIC VALVE AV Area (Vmax):    1.41 cm AV Area (Vmean):   1.39 cm AV Area (VTI):     1.46 cm AV Vmax:           206.00 cm/s AV Vmean:          136.000 cm/s AV VTI:            0.396 m AV Peak Grad:      17.0 mmHg AV Mean Grad:      9.0 mmHg LVOT Vmax:         114.00 cm/s LVOT Vmean:        74.100 cm/s LVOT VTI:          0.228 m LVOT/AV VTI ratio: 0.58  AORTA Ao Root diam: 2.50 cm Ao Asc diam:  3.20 cm MITRAL VALVE               TRICUSPID VALVE MV Area (PHT): 3.08 cm    TR  Peak grad:   25.2 mmHg MV Decel Time: 246 msec    TR Vmax:        251.00 cm/s MV E velocity: 71.10 cm/s MV A velocity: 69.40 cm/s  SHUNTS MV E/A ratio:  1.02        Systemic VTI:  0.23 m                            Systemic Diam: 1.80 cm Fransico Him MD Electronically signed by Fransico Him MD Signature Date/Time: 03/13/2021/1:47:53 PM    Final    VAS US CAROTID  Result  Date: 03/13/2021 Carotid Arterial Duplex Study Patient Name:  NERIDA BOIVIN  Date of Exam:   03/13/2021 Medical Rec #: [id number]             Accession #:    [id number] Date of Birth: [birth year]             Patient Gender: F Patient Age:   45 years Exam [facility] Procedure:      VAS US CAROTID Referring Phys: Wandra Feinstein RATHORE --------------------------------------------------------------------------------  Indications:       Syncope. Risk Factors:      Hypertension, hyperlipidemia. Comparison Study:  01/12/19 prior Performing Technologist: Archie Patten RVS  Examination Guidelines: A complete evaluation includes B-mode imaging, spectral Doppler, color Doppler, and power Doppler as needed of all accessible portions of each vessel. Bilateral testing is considered an integral part of a complete examination. Limited examinations for reoccurring indications may be performed as noted.  Right Carotid Findings: +----------+--------+--------+--------+------------------+--------+           PSV cm/sEDV cm/sStenosisPlaque DescriptionComments +----------+--------+--------+--------+------------------+--------+ CCA Prox  73      16              heterogenous               +----------+--------+--------+--------+------------------+--------+ CCA Distal53      19              heterogenous               +----------+--------+--------+--------+------------------+--------+ ICA Prox  54      21      1-39%   heterogenous               +----------+--------+--------+--------+------------------+--------+ ICA Distal116     40                                         +----------+--------+--------+--------+------------------+--------+ ECA       57      11                                         +----------+--------+--------+--------+------------------+--------+ +----------+--------+-------+--------+-------------------+           PSV cm/sEDV cmsDescribeArm Pressure (mmHG)  +----------+--------+-------+--------+-------------------+ [id number]                                         +----------+--------+-------+--------+-------------------+ +---------+--------+--+--------+--+---------+ VertebralPSV cm/s45EDV cm/s14Antegrade +---------+--------+--+--------+--+---------+  Left Carotid Findings: +----------+--------+--------+--------+------------------+--------+           PSV cm/sEDV cm/sStenosisPlaque DescriptionComments +----------+--------+--------+--------+------------------+--------+ CCA Prox  55      11  heterogenous               +----------+--------+--------+--------+------------------+--------+ CCA Distal52      15              heterogenous               +----------+--------+--------+--------+------------------+--------+ ICA Prox  48      19      1-39%   heterogenous               +----------+--------+--------+--------+------------------+--------+ ICA Distal67      21                                         +----------+--------+--------+--------+------------------+--------+ ECA       69      8                                          +----------+--------+--------+--------+------------------+--------+ +----------+--------+--------+--------+-------------------+           PSV cm/sEDV cm/sDescribeArm Pressure (mmHG) +----------+--------+--------+--------+-------------------+ [id number]                                          +----------+--------+--------+--------+-------------------+ +---------+--------+--+--------+--+---------+ VertebralPSV cm/s47EDV cm/s14Antegrade +---------+--------+--+--------+--+---------+   Summary: Right Carotid: Velocities in the right ICA are consistent with a 1-39% stenosis. Left Carotid: Velocities in the left ICA are consistent with a 1-39% stenosis. Vertebrals: Bilateral vertebral arteries demonstrate antegrade flow. *See table(s) above for measurements and  observations.  Electronically signed by Deitra Mayo MD on 03/13/2021 at 3:58:02 PM.    Final    VAS [place] LOWER EXTREMITY VENOUS (DVT)  Result Date: 03/13/2021  Lower Venous DVT Study Patient Name:  Deborah Shields  Date of Exam:   03/13/2021 Medical Rec #: [id number]             Accession #:    [id number] Date of Birth: Feb 27, 1941             Patient Gender: F Patient Age:   37 years Exam Location:  [facility] Procedure:      VAS [place] LOWER EXTREMITY VENOUS (DVT) Referring Phys: [place] --------------------------------------------------------------------------------  Indications: Syncope.  Comparison Study: no prior Performing Technologist: Archie Patten RVS  Examination Guidelines: A complete evaluation includes B-mode imaging, spectral Doppler, color Doppler, and power Doppler as needed of all accessible portions of each vessel. Bilateral testing is considered an integral part of a complete examination. Limited examinations for reoccurring indications may be performed as noted. The reflux portion of the exam is performed with the patient in reverse Trendelenburg.  +---------+---------------+---------+-----------+----------+--------------+ RIGHT    CompressibilityPhasicitySpontaneityPropertiesThrombus Aging +---------+---------------+---------+-----------+----------+--------------+ CFV      Full           Yes      Yes                                 +---------+---------------+---------+-----------+----------+--------------+ SFJ      Full                                                        +---------+---------------+---------+-----------+----------+--------------+  FV Prox  Full                                                        +---------+---------------+---------+-----------+----------+--------------+ FV Mid   Full                                                        +---------+---------------+---------+-----------+----------+--------------+ FV  DistalFull                                                        +---------+---------------+---------+-----------+----------+--------------+ PFV      Full                                                        +---------+---------------+---------+-----------+----------+--------------+ POP      Full           Yes      Yes                                 +---------+---------------+---------+-----------+----------+--------------+ PTV      Full                                                        +---------+---------------+---------+-----------+----------+--------------+ PERO     Full                                                        +---------+---------------+---------+-----------+----------+--------------+   +---------+---------------+---------+-----------+----------+--------------+ LEFT     CompressibilityPhasicitySpontaneityPropertiesThrombus Aging +---------+---------------+---------+-----------+----------+--------------+ CFV      Full           Yes      Yes                                 +---------+---------------+---------+-----------+----------+--------------+ SFJ      Full                                                        +---------+---------------+---------+-----------+----------+--------------+ FV Prox  Full                                                        +---------+---------------+---------+-----------+----------+--------------+  FV Mid   Full                                                        +---------+---------------+---------+-----------+----------+--------------+ FV DistalFull                                                        +---------+---------------+---------+-----------+----------+--------------+ PFV      Full                                                        +---------+---------------+---------+-----------+----------+--------------+ POP      Full           Yes      Yes                                  +---------+---------------+---------+-----------+----------+--------------+ PTV      Full                                                        +---------+---------------+---------+-----------+----------+--------------+ PERO     Full                                                        +---------+---------------+---------+-----------+----------+--------------+     Summary: BILATERAL: - No evidence of deep vein thrombosis seen in the lower extremities, bilaterally. -No evidence of popliteal cyst, bilaterally.   *See table(s) above for measurements and observations. Electronically signed by Deitra Mayo MD on 03/13/2021 at 3:56:56 PM.    Final      LOS: 2 days   Antonieta Pert, MD [facility]  03/14/2021, 8:35 AM

## 2021-03-15 DIAGNOSIS — Z7189 Other specified counseling: Secondary | ICD-10-CM

## 2021-03-15 DIAGNOSIS — E43 Unspecified severe protein-calorie malnutrition: Secondary | ICD-10-CM

## 2021-03-15 DIAGNOSIS — G9340 Encephalopathy, unspecified: Secondary | ICD-10-CM

## 2021-03-15 DIAGNOSIS — Z66 Do not resuscitate: Secondary | ICD-10-CM

## 2021-03-15 DIAGNOSIS — I480 Paroxysmal atrial fibrillation: Secondary | ICD-10-CM

## 2021-03-15 DIAGNOSIS — Z515 Encounter for palliative care: Secondary | ICD-10-CM

## 2021-03-15 DIAGNOSIS — R296 Repeated falls: Secondary | ICD-10-CM | POA: Diagnosis not present

## 2021-03-15 DIAGNOSIS — S72115A Nondisplaced fracture of greater trochanter of left femur, initial encounter for closed fracture: Secondary | ICD-10-CM | POA: Diagnosis not present

## 2021-03-15 DIAGNOSIS — N179 Acute kidney failure, unspecified: Secondary | ICD-10-CM | POA: Diagnosis not present

## 2021-03-15 LAB — URINE CULTURE: Culture: >=100000 — AB

## 2021-03-15 LAB — CBC
HCT: 29.6 % — ABNORMAL LOW (ref 36.0–46.0)
Hemoglobin: 9.4 g/dL — ABNORMAL LOW (ref 12.0–15.0)
MCH: 29.5 pg (ref 26.0–34.0)
MCHC: 31.8 g/dL (ref 30.0–36.0)
MCV: 92.8 fL (ref 80.0–100.0)
Platelets: 221 10*3/uL (ref 150–400)
RBC: 3.19 MIL/uL — ABNORMAL LOW (ref 3.87–5.11)
RDW: 15.8 % — ABNORMAL HIGH (ref 11.5–15.5)
WBC: 7.1 10*3/uL (ref 4.0–10.5)
nRBC: 0 % (ref 0.0–0.2)

## 2021-03-15 LAB — BASIC METABOLIC PANEL
Anion gap: 7 (ref 5–15)
BUN: 33 mg/dL — ABNORMAL HIGH (ref 8–23)
CO2: 20 mmol/L — ABNORMAL LOW (ref 22–32)
Calcium: 8.3 mg/dL — ABNORMAL LOW (ref 8.9–10.3)
Chloride: 110 mmol/L (ref 98–111)
Creatinine, Ser: 1.9 mg/dL — ABNORMAL HIGH (ref 0.44–1.00)
GFR, Estimated: 26 mL/min — ABNORMAL LOW (ref >60)
Glucose, Bld: 98 mg/dL (ref 70–99)
Potassium: 3.7 mmol/L (ref 3.5–5.1)
Sodium: 137 mmol/L (ref 135–145)

## 2021-03-15 MED ORDER — HYDRALAZINE HCL 25 MG PO TABS
25.0000 mg | ORAL_TABLET | Freq: Three times a day (TID) | ORAL | Status: DC | PRN
  Administered 2021-03-15 – 2021-03-19 (×7): 25 mg via ORAL
  Filled 2021-03-15 (×7): qty 1

## 2021-03-15 MED ORDER — CYCLOBENZAPRINE HCL 10 MG PO TABS
10.0000 mg | ORAL_TABLET | Freq: Two times a day (BID) | ORAL | Status: DC | PRN
  Administered 2021-03-16: 10 mg via ORAL
  Filled 2021-03-15: qty 1

## 2021-03-15 MED ORDER — LORAZEPAM 0.5 MG PO TABS
0.5000 mg | ORAL_TABLET | Freq: Three times a day (TID) | ORAL | Status: DC | PRN
  Administered 2021-03-15 – 2021-03-18 (×2): 0.5 mg via ORAL
  Filled 2021-03-15 (×2): qty 1

## 2021-03-15 MED ORDER — B COMPLEX PO TABS: 1.0000 | ORAL_TABLET | Freq: Every day | ORAL | Status: DC

## 2021-03-15 MED ORDER — SENNA 8.6 MG PO TABS
1.0000 | ORAL_TABLET | ORAL | Status: DC
  Administered 2021-03-15 – 2021-03-17 (×2): 8.6 mg via ORAL
  Filled 2021-03-15 (×3): qty 1

## 2021-03-15 MED ORDER — TRAMADOL HCL 50 MG PO TABS
25.0000 mg | ORAL_TABLET | Freq: Every day | ORAL | Status: DC | PRN
  Administered 2021-03-16: 25 mg via ORAL
  Administered 2021-03-17 – 2021-03-18 (×2): 50 mg via ORAL
  Filled 2021-03-15 (×3): qty 1

## 2021-03-15 MED ORDER — B COMPLEX-C PO TABS
1.0000 | ORAL_TABLET | Freq: Every day | ORAL | Status: DC
  Administered 2021-03-15 – 2021-03-18 (×4): 1 via ORAL
  Filled 2021-03-15 (×5): qty 1

## 2021-03-15 MED ORDER — MECLIZINE HCL 12.5 MG PO TABS
12.5000 mg | ORAL_TABLET | Freq: Three times a day (TID) | ORAL | Status: DC | PRN
  Administered 2021-03-17: 12.5 mg via ORAL
  Filled 2021-03-15 (×2): qty 1

## 2021-03-15 MED ORDER — SENNOSIDES 8.6 MG PO TABS: 1.0000 | ORAL_TABLET | ORAL | Status: DC

## 2021-03-15 MED ORDER — VITAMIN D 25 MCG (1000 UNIT) PO TABS
1000.0000 [IU] | ORAL_TABLET | Freq: Every day | ORAL | Status: DC
  Administered 2021-03-15 – 2021-03-19 (×5): 1000 [IU] via ORAL
  Filled 2021-03-15 (×6): qty 1

## 2021-03-15 NOTE — Evaluation (Signed)
Physical Therapy Evaluation Patient Details Name: Deborah Shields MRN: [id number] DOB: Nov 07, 1940 Today's Date: 03/15/2021  History of Present Illness  80 y.o. female presents to [facility] ED on 03/12/2021 after being found down and hallucinating by daughter. Pt found to have L greater trochanter fx and UTI. PMH includes PAF, tachybrady syndrome status post PPM, CKD stage IIIb, hyperlipidemia, hypertension, hypothyroidism, PVD, history of PE, carotid stenosis, anemia.  Clinical Impression  Pt presents to PT with deficits in cognition, power, strength, endurance, balance, gait, and with a significant history of falls. Pt remains cognitively altered, referring to her daughter as her mother and with impaired memory of previous falls. Pt's daughter reports a recent history of pt often ambulating without the use of an assistive device despite multiple past falls. Pt requires verbal cues to maintain hip precautions and benefits from physical assistance to transfer from lower surfaces. Pt recommends SNF placement at this time as the pt does not have consistent caregiver support at home and will benefit from continued therapy services to reduce falls risk. Pt will likely need to plan a transition to ALF for long term care due to progressive cognitive decline.       Recommendations for follow up therapy are one component of a multi-disciplinary discharge planning process, led by the attending physician.  Recommendations may be updated based on patient status, additional functional criteria and insurance authorization.  Follow Up Recommendations SNF (with long term transition to ALF)    Equipment Recommendations  Rolling walker with 5" wheels    Recommendations for Other Services       Precautions / Restrictions Precautions Precautions: Fall Restrictions Weight Bearing Restrictions: No      Mobility  Bed Mobility Overal bed mobility: Needs Assistance Bed Mobility: Supine to Sit     Supine to  sit: Supervision     General bed mobility comments: verbal cues to maintain hip precautions    Transfers Overall transfer level: Needs assistance Equipment used: Rolling walker (2 wheeled) Transfers: Sit to/from Stand Sit to Stand: Min guard;Min assist         General transfer comment: minA from bed, minG from recliner with use of armrests  Ambulation/Gait Ambulation/Gait assistance: Min guard Gait Distance (Feet): 100 Feet Assistive device: Rolling walker (2 wheeled) Gait Pattern/deviations: Step-through pattern Gait velocity: reduced Gait velocity interpretation: <1.8 ft/sec, indicate of risk for recurrent falls General Gait Details: pt with slowed step-through gait, reduced stride length, cues for problem solving when reaching end of hallway  Stairs            Wheelchair Mobility    Modified Rankin (Stroke Patients Only)       Balance Overall balance assessment: Needs assistance Sitting-balance support: No upper extremity supported;Feet supported Sitting balance-Leahy Scale: Good     Standing balance support: Bilateral upper extremity supported Standing balance-Leahy Scale: Poor Standing balance comment: pt to WB only with use of walker per orders                             Pertinent Vitals/Pain Pain Assessment: Faces Faces Pain Scale: Hurts little more Pain Location: L hip Pain Descriptors / Indicators: Sore Pain Intervention(s): Monitored during session    Home Living Family/patient expects to be discharged to:: Private residence Living Arrangements: Alone Available Help at Discharge: Family;Available PRN/intermittently Type of Home: House Home Access: Stairs to enter Entrance Stairs-Rails: None Entrance Stairs-Number of Steps: 2 Home Layout: One level Home Equipment:  Walker - 4 wheels;Shower seat;Bedside commode      Prior Function Level of Independence: Independent with assistive device(s)         Comments: pt ambulates  with rollator but often ambulates without device as well, multiple falls recently     Hand Dominance   Dominant Hand: Right    Extremity/Trunk Assessment   Upper Extremity Assessment Upper Extremity Assessment: Generalized weakness    Lower Extremity Assessment Lower Extremity Assessment: Generalized weakness (grossly 4/5 BLE)    Cervical / Trunk Assessment Cervical / Trunk Assessment: Kyphotic  Communication   Communication: No difficulties  Cognition Arousal/Alertness: Awake/alert Behavior During Therapy: WFL for tasks assessed/performed Overall Cognitive Status: History of cognitive impairments - at baseline                                 General Comments: dtr reports ~2 year history of cognitive decline. Pt with impaired memory currently, referring to her daughter as her mother. Pt also reports she and her daughter both fell at the same time recently. Impaired ability to follow multi-step directions noted.      General Comments General comments (skin integrity, edema, etc.): VSS on RA    Exercises     Assessment/Plan    PT Assessment Patient needs continued PT services  PT Problem List Decreased strength;Decreased activity tolerance;Decreased balance;Decreased mobility;Decreased cognition;Decreased knowledge of use of DME;Decreased safety awareness;Decreased knowledge of precautions;Pain       PT Treatment Interventions DME instruction;Stair training;Gait training;Functional mobility training;Therapeutic activities;Therapeutic exercise;Balance training;Neuromuscular re-education;Patient/family education;Cognitive remediation    PT Goals (Current goals can be found in the Care Plan section)  Acute Rehab PT Goals Patient Stated Goal: to reduce falls risk PT Goal Formulation: With patient Time For Goal Achievement: 03/29/21 Potential to Achieve Goals: Fair    Frequency Min 3X/week   Barriers to discharge Decreased caregiver support       Co-evaluation               AM-PAC PT "6 Clicks" Mobility  Outcome Measure Help needed turning from your back to your side while in a flat bed without using bedrails?: A Little Help needed moving from lying on your back to sitting on the side of a flat bed without using bedrails?: A Little Help needed moving to and from a bed to a chair (including a wheelchair)?: A Little Help needed standing up from a chair using your arms (e.g., wheelchair or bedside chair)?: A Little Help needed to walk in hospital room?: A Little Help needed climbing 3-5 steps with a railing? : A Lot 6 Click Score: 17    End of Session   Activity Tolerance: Patient tolerated treatment well Patient left: in chair;with call bell/phone within reach;with chair alarm set;with family/visitor present Nurse Communication: Mobility status PT Visit Diagnosis: Other abnormalities of gait and mobility (R26.89);Repeated falls (R29.6);History of falling (Z91.81);Pain Pain - Right/Left: Left Pain - part of body: Hip    Time: 6834-1962 PT Time Calculation (min) (ACUTE ONLY): 34 min   Charges:   PT Evaluation $PT Eval Moderate Complexity: 1 Mod          Zenaida Niece, PT, DPT Acute Rehabilitation Pager: [phone]   Zenaida Niece 03/15/2021, 4:27 PM

## 2021-03-15 NOTE — Progress Notes (Addendum)
PROGRESS NOTE    Deborah Shields  VXY:[id number] DOB: [birth year] DOA: 03/12/2021 PCP: Lajean Manes, MD   Brief Narrative/Hospital Course: 80 year old female with PAF on Eliquis, tachybradycardia syndrome post PPM, CKD 3B, HLD, HTN, hypothyroidism, PVD, history of PE, carotid stenosis, anemia was found on the floor confused hallucinating and crawling by the daughter and brought to the ED for evaluation.  Patient was last normal at her baseline all day earlier in the evening 9/18.   In the ED found to have AKI creatinine up fom 1.9-2.4 previously 1.4 baseline. UA WBC more than 50 suspecting UTI urine culture sent, chest x-ray no acute finding CT head and C-spine negative for acute finding, CT of the left hip acute mildly displaced and comminuted fracture through the left greater trochanter, also with thickening of the sigmoid colon and rectum with mild pericolonic edema suspicious for colitis/proctitis, orthopedic was consulted IV fluid was given, placed on antibiotic and admitted for further management of UTI, left greater Trochanteric fracture, sigmoid colitis/proctitis, AKI  Subjective: Seen and examined this morning.  Patient resting comfortably daughter at the bedside Daughter  Thinks patient is still confused more than her baseline. Overnight afebrile.  Blood pressure running high.  Assessment & Plan:  E Coli UTI POA: Continue ceftriaxone, fu C/S to de-escalate to oral antibiotics  Proctitis/colitis: Noted on the CT abdomen.  Diet as tolerated, continue ceftriaxone/Flagyl.   Left greater trochanteric fracture seen by orthopedic, plan is for nonoperative management.  Requesting PT OT eval WBAT and follow-up with Dr. Ninfa Linden in 2 weeks  AKI on CKD 3b Mild anion gap metabolic acidosis: Baseline creatinine 1.5 but recently 1.9 on admission 2.4.  Creatinine nicely improving with IV fluid hydration.  Increase p.o. Recent Labs  Lab 03/12/21 2142 03/13/21 1603 03/14/21 0034  03/15/21 0030  BUN 39* 40* 39* 33*  CREATININE 2.44* 2.48* 2.29* 1.90*     Acute metabolic encephalopathy MCI- memory issues with confusion-likely dementia Multiple falls rule out syncope: Suspecting metabolic encephalopathy, syncope work-up initiated on admission but no witnessed syncope was found on the floor confused.  CK normal, 2D echo normal EF, EEG no seizure.  Work up unrevealing with CT head, B12, ammonia, alcohol level.  Monitor orthostatics.  Encourage PT OT.She did have D-dimer done work-up of  syncope and was elevated but no respiratory symptoms or chest pain or leg edema-do not suspect VTE- and she is already anticoagulated. Duplex leg neg.  PAF continue on Eliquis metoprolol and flecainide Tachybradycardia syndrome with pacemaker in place QT prolongation monitor Goal of care DNR , discussed with daughter at the bedside monitor closely elderly at risk of decompensation requires admission. PMT consulted. Severe protein calorie malnutrition:RD following plan as below Nutrition Problem: Severe Malnutrition Etiology: chronic illness (CKD) Signs/Symptoms: severe muscle depletion, severe fat depletion Interventions: Ensure Enlive (each supplement provides 350kcal and 20 grams of protein)     Diet Order             Diet regular Room service appropriate? Yes; Fluid consistency: Thin  Diet effective now                  Pressure Injury 03/13/21 Coccyx Medial Stage 2 -  Partial thickness loss of dermis presenting as a shallow open injury with a red, pink wound bed without slough. (Active)  03/13/21 1913  Location: Coccyx  Location Orientation: Medial  Staging: Stage 2 -  Partial thickness loss of dermis presenting as a shallow open injury with a red, pink wound  bed without slough.  Wound Description (Comments):   Present on Admission: Yes    DVT prophylaxis: apixaban (ELIQUIS) tablet 2.5 mg Start: 03/13/21 1145 Code Status:   Code Status: DNR  Family Communication: plan  of care discussed with patient and daughter  at bedside.  Status is: Inpatient Remains inpatient appropriate because:IV treatments appropriate due to intensity of illness or inability to take PO and Inpatient level of care appropriate due to severity of illness Dispo: The patient is from: Home              Anticipated d/c is to: SNF              Patient currently is not medically stable to d/c.   Difficult to place patient No  Objective: Vitals: Today's Vitals   03/14/21 2300 03/14/21 2347 03/15/21 0300 03/15/21 0406  BP: (!) 162/95 (!) 162/95 (!) 181/94 (!) 175/84  Pulse: 60 60 63 63  Resp: 18 17 16 13   Temp:  98.5 F (36.9 C)  98.8 F (37.1 C)  TempSrc:  Oral  Oral  SpO2: 97% 95% 96% 98%  PainSc: 0-No pain  0-No pain    Examination: General exam: AAOx 2, elderly, weak appearing. HEENT:Oral mucosa moist, Ear/Nose WNL grossly, dentition normal. Respiratory system: bilaterally clear breath sounds, no use of accessory muscle Cardiovascular system: S1 & S2 +, No JVD,. Gastrointestinal system: Abdomen soft, NT,ND, BS+ Nervous System:Alert, awake, moving extremities and grossly nonfocal Extremities: no edema, distal peripheral pulses palpable.  Skin: No rashes,no icterus. MSK: Normal muscle bulk,tone, power    Intake/Output Summary (Last 24 hours) at 03/15/2021 0717 Last data filed at 03/15/2021 2297 Gross per 24 hour  Intake 1705.76 ml  Output 1800 ml  Net -94.24 ml    There were no vitals filed for this visit. Weight change:    Consultants:see note  Procedures:see note Antimicrobials: Anti-infectives (From admission, onward)    Start     Dose/Rate Route Frequency Ordered Stop   03/13/21 0615  cefTRIAXone (ROCEPHIN) 1 g in sodium chloride 0.9 % 100 mL IVPB        1 g 200 mL/hr over 30 Minutes Intravenous Daily 03/13/21 0608     03/13/21 0615  metroNIDAZOLE (FLAGYL) IVPB 500 mg        500 mg 100 mL/hr over 60 Minutes Intravenous Every 12 hours 03/13/21 0608         Culture/Microbiology    Component Value Date/Time   SDES IN/OUT CATH URINE 03/13/2021 0021   SPECREQUEST NONE 03/13/2021 0021   CULT (A) 03/13/2021 0021    >=100,000 COLONIES/mL ESCHERICHIA COLI SUSCEPTIBILITIES TO FOLLOW Performed at [facility], [address] [place], [place] [address]    REPTSTATUS PENDING 03/13/2021 0021    Other culture-see note  Unresulted Labs (From admission, onward)     Start     Ordered   03/14/21 1941  Basic metabolic panel  Daily,   R     Question:  Specimen collection method  Answer:  Lab=Lab collect   03/13/21 1134   03/14/21 0500  CBC  Daily,   R     Question:  Specimen collection method  Answer:  Lab=Lab collect   03/13/21 1134          Medications reviewed:  Scheduled Meds:  apixaban  2.5 mg Oral BID   feeding supplement  237 mL Oral BID BM   flecainide  75 mg Oral BID   levothyroxine  88 mcg Oral  QAC breakfast   metoprolol succinate  100 mg Oral Daily   Continuous Infusions:  sodium chloride 75 mL/hr at 03/14/21 2126   cefTRIAXone (ROCEPHIN)  IV 1 g (03/15/21 0501)   metronidazole 500 mg (03/15/21 0551)     Intake/Output from previous day: 09/21 0701 - 09/22 0700 In: 1705.8 [P.O.:240; I.V.:1094; IV Piggyback:371.8] Out: 1800 [Urine:1800] Intake/Output this shift: No intake/output data recorded. There were no vitals filed for this visit. Data Reviewed: I have personally reviewed following labs and imaging studies CBC: Recent Labs  Lab 03/12/21 2142 03/14/21 0034 03/15/21 0030  WBC 7.2 8.2 7.1  NEUTROABS 5.9  --   --   HGB 12.1 11.2* 9.4*  HCT 38.6 34.7* 29.6*  MCV 95.1 92.3 92.8  PLT 202 225 417    Basic Metabolic Panel: Recent Labs  Lab 03/12/21 2142 03/13/21 0605 03/13/21 1603 03/14/21 0034 03/15/21 0030  NA 135  --  137 138 137  K 4.0  --  3.9 3.6 3.7  CL 104  --  106 107 110  CO2 19*  --  20* 20* 20*  GLUCOSE 109*  --  98 89 98  BUN 39*  --  40* 39* 33*  CREATININE 2.44*  --  2.48* 2.29*  1.90*  CALCIUM 9.3  --  8.3* 8.5* 8.3*  MG  --  2.1  --   --   --     GFR: CrCl cannot be calculated (Unknown ideal weight.). Liver Function Tests: Recent Labs  Lab 03/12/21 2142  AST 26  ALT 13  ALKPHOS 108  BILITOT 1.3*  PROT 6.7  ALBUMIN 2.9*    No results for input(s): LIPASE, AMYLASE in the last 168 hours. Recent Labs  Lab 03/12/21 2144  AMMONIA 14    Coagulation Profile: Recent Labs  Lab 03/12/21 2142  INR 1.5*    Cardiac Enzymes: Recent Labs  Lab 03/13/21 0605  CKTOTAL 120    BNP (last 3 results) No results for input(s): PROBNP in the last 8760 hours. HbA1C: No results for input(s): HGBA1C in the last 72 hours. CBG: No results for input(s): GLUCAP in the last 168 hours. Lipid Profile: No results for input(s): CHOL, HDL, LDLCALC, TRIG, CHOLHDL, LDLDIRECT in the last 72 hours. Thyroid Function Tests: No results for input(s): TSH, T4TOTAL, FREET4, T3FREE, THYROIDAB in the last 72 hours. Anemia Panel: Recent Labs    03/13/21 0605  VITAMINB12 253    Sepsis Labs: No results for input(s): PROCALCITON, LATICACIDVEN in the last 168 hours.  Recent Results (from the past 240 hour(s))  Resp Panel by RT-PCR (Flu A&B, Covid) Nasopharyngeal Swab     Status: None   Collection Time: 03/12/21  8:51 PM   Specimen: Nasopharyngeal Swab; Nasopharyngeal(NP) swabs in vial transport medium  Result Value Ref Range Status   SARS Coronavirus 2 by RT PCR NEGATIVE NEGATIVE Final    Comment: (NOTE) SARS-CoV-2 target nucleic acids are NOT DETECTED.  The SARS-CoV-2 RNA is generally detectable in upper respiratory specimens during the acute phase of infection. The lowest concentration of SARS-CoV-2 viral copies this assay can detect is 138 copies/mL. A negative result does not preclude SARS-Cov-2 infection and should not be used as the sole basis for treatment or other patient management decisions. A negative result may occur with  improper specimen  collection/handling, submission of specimen other than nasopharyngeal swab, presence of viral mutation(s) within the areas targeted by this assay, and inadequate number of viral copies(<138 copies/mL). A negative result must be combined  with clinical observations, patient history, and epidemiological information. The expected result is Negative.  Fact Sheet for Patients:  EntrepreneurPulse.com.au  Fact Sheet for Healthcare Providers:  IncredibleEmployment.be  This test is no t yet approved or cleared by the [place] FDA and  has been authorized for detection and/or diagnosis of SARS-CoV-2 by FDA under an Emergency Use Authorization (EUA). This EUA will remain  in effect (meaning this test can be used) for the duration of the COVID-19 declaration under Section 564(b)(1) of the Act, 21 U.S.C.section 360bbb-3(b)(1), unless the authorization is terminated  or revoked sooner.       Influenza A by PCR NEGATIVE NEGATIVE Final   Influenza B by PCR NEGATIVE NEGATIVE Final    Comment: (NOTE) The Xpert Xpress SARS-CoV-2/FLU/RSV plus assay is intended as an aid in the diagnosis of influenza from Nasopharyngeal swab specimens and should not be used as a sole basis for treatment. Nasal washings and aspirates are unacceptable for Xpert Xpress SARS-CoV-2/FLU/RSV testing.  Fact Sheet for Patients: EntrepreneurPulse.com.au  Fact Sheet for Healthcare Providers: IncredibleEmployment.be  This test is not yet approved or cleared by the [place] FDA and has been authorized for detection and/or diagnosis of SARS-CoV-2 by FDA under an Emergency Use Authorization (EUA). This EUA will remain in effect (meaning this test can be used) for the duration of the COVID-19 declaration under Section 564(b)(1) of the Act, 21 U.S.C. section 360bbb-3(b)(1), unless the authorization is terminated or revoked.  Performed at [facility], [place] [address] [place], [place] [address]   Urine Culture     Status: Abnormal (Preliminary result)   Collection Time: 15-Mar-2021 12:21 AM   Specimen: In/Out Cath Urine  Result Value Ref Range Status   Specimen Description IN/OUT CATH URINE  Final   Special Requests NONE  Final   Culture (A)  Final    >=100,000 COLONIES/mL ESCHERICHIA COLI SUSCEPTIBILITIES TO FOLLOW Performed at [facility], [place] [address] [place], [place] [address]    Report Status PENDING  Incomplete      Radiology Studies: EEG adult  Result Date: 03/15/21 Lora Havens, MD     2021/03/15 12:18 PM Patient Name: Senaya Dicenso MRN: [id number] Epilepsy Attending: Lora Havens Referring Physician/Provider: Dr Derrick Ravel Date: 15-Mar-2021 Duration: 22.45 mins Patient history: 80yo F with ams and syncope. EEG to evaluate for seizure. Level of alertness: Awake,asleep AEDs during EEG study: None Technical aspects: This EEG study was done with scalp electrodes positioned according to the 10-20 International system of electrode placement. Electrical activity was acquired at a sampling rate of 500Hz  and reviewed with a high frequency filter of 70Hz  and a low frequency filter of 1Hz . EEG data were recorded continuously and digitally stored. Description: The posterior dominant rhythm consists of 8-9 Hz activity of moderate voltage (25-35 uV) seen predominantly in posterior head regions, symmetric and reactive to eye opening and eye closing. Sleep was characterized by vertex waves, sleep spindles (12 to 14 Hz), maximal frontocentral region. Hyperventilation and photic stimulation were not performed.   IMPRESSION: This study is within normal limits. No seizures or epileptiform discharges were seen throughout the recording. Lora Havens   ECHOCARDIOGRAM COMPLETE  Result Date: 2021-03-15    ECHOCARDIOGRAM REPORT   Patient Name:   JAZZMIN NEWBOLD Date of Exam: 03-15-21 Medical Rec #:   [id number]            Height:       66.0 in Accession #:  [id number]           Weight:       100.0 lb Date of Birth:  [birth year]            BSA:          1.490 m Patient Age:    47 years             BP:           140/81 mmHg Patient Gender: F                    HR:           77 bpm. Exam Location:  Inpatient Procedure: 2D Echo, Cardiac Doppler and Color Doppler Indications:    R55 Syncope  History:        Patient has prior history of Echocardiogram examinations, most                 recent 09/14/2020. Abnormal ECG, COPD, Arrythmias:Atrial                 Fibrillation, Signs/Symptoms:Chest Pain; Risk                 Factors:Hypertension and Dyslipidemia.  Sonographer:    Roseanna Rainbow RDCS Referring Phys: [id number] [facility]  Sonographer Comments: Technically difficult study due to poor echo windows. Rushed for productivity and called back to OR 17. IMPRESSIONS  1. Left ventricular ejection fraction, by estimation, is 60 to 65%. The left ventricle has normal function. The left ventricle has no regional wall motion abnormalities. Left ventricular diastolic parameters are indeterminate.  2. Right ventricular systolic function is normal. The right ventricular size is normal. There is normal pulmonary artery systolic pressure.  3. The mitral valve is normal in structure. Trivial mitral valve regurgitation. No evidence of mitral stenosis.  4. The aortic valve is calcified. There is moderate calcification of the aortic valve. There is moderate thickening of the aortic valve. Aortic valve regurgitation is mild. No aortic stenosis is present. Aortic valve area, by VTI measures 1.46 cm. Aortic valve mean gradient measures 9.0 mmHg. Aortic valve Vmax measures 2.06 m/s.  5. The inferior vena cava is normal in size with greater than 50% respiratory variability, suggesting right atrial pressure of 3 mmHg. FINDINGS  Left Ventricle: Left ventricular ejection fraction, by estimation, is 60 to 65%. The left ventricle has normal  function. The left ventricle has no regional wall motion abnormalities. The left ventricular internal cavity size was normal in size. There is  no left ventricular hypertrophy. Left ventricular diastolic parameters are indeterminate. Normal left ventricular filling pressure. Right Ventricle: The right ventricular size is normal. No increase in right ventricular wall thickness. Right ventricular systolic function is normal. There is normal pulmonary artery systolic pressure. The tricuspid regurgitant velocity is 2.51 m/s, and  with an assumed right atrial pressure of 8 mmHg, the estimated right ventricular systolic pressure is 08.1 mmHg. Left Atrium: Left atrial size was normal in size. Right Atrium: Right atrial size was normal in size. Pericardium: There is no evidence of pericardial effusion. Mitral Valve: The mitral valve is normal in structure. Trivial mitral valve regurgitation. No evidence of mitral valve stenosis. Tricuspid Valve: The tricuspid valve is normal in structure. Tricuspid valve regurgitation is mild . No evidence of tricuspid stenosis. Aortic Valve: The aortic valve is calcified. There is moderate calcification of the aortic valve. There is moderate thickening of the aortic valve. Aortic valve regurgitation is mild.  No aortic stenosis is present. Aortic valve mean gradient measures 9.0  mmHg. Aortic valve peak gradient measures 17.0 mmHg. Aortic valve area, by VTI measures 1.46 cm. Pulmonic Valve: The pulmonic valve was normal in structure. Pulmonic valve regurgitation is not visualized. No evidence of pulmonic stenosis. Aorta: The aortic root is normal in size and structure. Venous: The inferior vena cava is normal in size with greater than 50% respiratory variability, suggesting right atrial pressure of 3 mmHg. IAS/Shunts: No atrial level shunt detected by color flow Doppler.  LEFT VENTRICLE PLAX 2D LVIDd:         3.60 cm     Diastology LVIDs:         2.00 cm     LV e' medial:    4.68 cm/s LV  PW:         0.90 cm     LV E/e' medial:  15.2 LV IVS:        0.90 cm     LV e' lateral:   7.40 cm/s LVOT diam:     1.80 cm     LV E/e' lateral: 9.6 LV SV:         58 LV SV Index:   39 LVOT Area:     2.54 cm  LV Volumes (MOD) LV vol d, MOD A2C: 40.4 ml LV vol d, MOD A4C: 38.2 ml LV vol s, MOD A2C: 15.9 ml LV vol s, MOD A4C: 11.6 ml LV SV MOD A2C:     24.5 ml LV SV MOD A4C:     38.2 ml LV SV MOD BP:      27.2 ml RIGHT VENTRICLE            IVC RV S prime:     9.68 cm/s  IVC diam: 0.90 cm TAPSE (M-mode): 1.4 cm LEFT ATRIUM           Index       RIGHT ATRIUM          Index LA diam:      2.70 cm 1.81 cm/m  RA Area:     8.31 cm LA Vol (A2C): 15.1 ml 10.14 ml/m RA Volume:   18.30 ml 12.29 ml/m LA Vol (A4C): 20.6 ml 13.83 ml/m  AORTIC VALVE AV Area (Vmax):    1.41 cm AV Area (Vmean):   1.39 cm AV Area (VTI):     1.46 cm AV Vmax:           206.00 cm/s AV Vmean:          136.000 cm/s AV VTI:            0.396 m AV Peak Grad:      17.0 mmHg AV Mean Grad:      9.0 mmHg LVOT Vmax:         114.00 cm/s LVOT Vmean:        74.100 cm/s LVOT VTI:          0.228 m LVOT/AV VTI ratio: 0.58  AORTA Ao Root diam: 2.50 cm Ao Asc diam:  3.20 cm MITRAL VALVE               TRICUSPID VALVE MV Area (PHT): 3.08 cm    TR Peak grad:   25.2 mmHg MV Decel Time: 246 msec    TR Vmax:        251.00 cm/s MV E velocity: 71.10 cm/s MV A velocity: 69.40 cm/s  SHUNTS MV E/A ratio:  1.02  Systemic VTI:  0.23 m                            Systemic Diam: 1.80 cm Fransico Him MD Electronically signed by Fransico Him MD Signature Date/Time: 03/13/2021/1:47:53 PM    Final    VAS US CAROTID  Result Date: 03/13/2021 Carotid Arterial Duplex Study Patient Name:  ALLISSON SCHINDEL  Date of Exam:   03/13/2021 Medical Rec #: [id number]             Accession #:    [id number] Date of Birth: Mar 05, 1941             Patient Gender: F Patient Age:   78 years Exam Location:  [facility] Procedure:      VAS US CAROTID Referring Phys: Wandra Feinstein RATHORE  --------------------------------------------------------------------------------  Indications:       Syncope. Risk Factors:      Hypertension, hyperlipidemia. Comparison Study:  01/12/19 prior Performing Technologist: Archie Patten RVS  Examination Guidelines: A complete evaluation includes B-mode imaging, spectral Doppler, color Doppler, and power Doppler as needed of all accessible portions of each vessel. Bilateral testing is considered an integral part of a complete examination. Limited examinations for reoccurring indications may be performed as noted.  Right Carotid Findings: +----------+--------+--------+--------+------------------+--------+           PSV cm/sEDV cm/sStenosisPlaque DescriptionComments +----------+--------+--------+--------+------------------+--------+ CCA Prox  73      16              heterogenous               +----------+--------+--------+--------+------------------+--------+ CCA Distal53      19              heterogenous               +----------+--------+--------+--------+------------------+--------+ ICA Prox  54      21      1-39%   heterogenous               +----------+--------+--------+--------+------------------+--------+ ICA Distal116     40                                         +----------+--------+--------+--------+------------------+--------+ ECA       57      11                                         +----------+--------+--------+--------+------------------+--------+ +----------+--------+-------+--------+-------------------+           PSV cm/sEDV cmsDescribeArm Pressure (mmHG) +----------+--------+-------+--------+-------------------+ [id number]                                         +----------+--------+-------+--------+-------------------+ +---------+--------+--+--------+--+---------+ VertebralPSV cm/s45EDV cm/s14Antegrade +---------+--------+--+--------+--+---------+  Left Carotid Findings:  +----------+--------+--------+--------+------------------+--------+           PSV cm/sEDV cm/sStenosisPlaque DescriptionComments +----------+--------+--------+--------+------------------+--------+ CCA Prox  55      11              heterogenous               +----------+--------+--------+--------+------------------+--------+ CCA Distal52      15  heterogenous               +----------+--------+--------+--------+------------------+--------+ ICA Prox  48      19      1-39%   heterogenous               +----------+--------+--------+--------+------------------+--------+ ICA Distal67      21                                         +----------+--------+--------+--------+------------------+--------+ ECA       69      8                                          +----------+--------+--------+--------+------------------+--------+ +----------+--------+--------+--------+-------------------+           PSV cm/sEDV cm/sDescribeArm Pressure (mmHG) +----------+--------+--------+--------+-------------------+ [id number]                                          +----------+--------+--------+--------+-------------------+ +---------+--------+--+--------+--+---------+ VertebralPSV cm/s47EDV cm/s14Antegrade +---------+--------+--+--------+--+---------+   Summary: Right Carotid: Velocities in the right ICA are consistent with a 1-39% stenosis. Left Carotid: Velocities in the left ICA are consistent with a 1-39% stenosis. Vertebrals: Bilateral vertebral arteries demonstrate antegrade flow. *See table(s) above for measurements and observations.  Electronically signed by Deitra Mayo MD on 03/13/2021 at 3:58:02 PM.    Final    VAS [place] LOWER EXTREMITY VENOUS (DVT)  Result Date: 03/13/2021  Lower Venous DVT Study Patient Name:  GLANDA SPANBAUER  Date of Exam:   03/13/2021 Medical Rec #: [id number]             Accession #:    [id number] Date of Birth: 28-Jun-1940              Patient Gender: F Patient Age:   19 years Exam Location:  [facility] Procedure:      VAS [place] LOWER EXTREMITY VENOUS (DVT) Referring Phys: Seibert --------------------------------------------------------------------------------  Indications: Syncope.  Comparison Study: no prior Performing Technologist: Archie Patten RVS  Examination Guidelines: A complete evaluation includes B-mode imaging, spectral Doppler, color Doppler, and power Doppler as needed of all accessible portions of each vessel. Bilateral testing is considered an integral part of a complete examination. Limited examinations for reoccurring indications may be performed as noted. The reflux portion of the exam is performed with the patient in reverse Trendelenburg.  +---------+---------------+---------+-----------+----------+--------------+ RIGHT    CompressibilityPhasicitySpontaneityPropertiesThrombus Aging +---------+---------------+---------+-----------+----------+--------------+ CFV      Full           Yes      Yes                                 +---------+---------------+---------+-----------+----------+--------------+ SFJ      Full                                                        +---------+---------------+---------+-----------+----------+--------------+ FV Prox  Full                                                        +---------+---------------+---------+-----------+----------+--------------+  FV Mid   Full                                                        +---------+---------------+---------+-----------+----------+--------------+ FV DistalFull                                                        +---------+---------------+---------+-----------+----------+--------------+ PFV      Full                                                        +---------+---------------+---------+-----------+----------+--------------+ POP      Full           Yes      Yes                                  +---------+---------------+---------+-----------+----------+--------------+ PTV      Full                                                        +---------+---------------+---------+-----------+----------+--------------+ PERO     Full                                                        +---------+---------------+---------+-----------+----------+--------------+   +---------+---------------+---------+-----------+----------+--------------+ LEFT     CompressibilityPhasicitySpontaneityPropertiesThrombus Aging +---------+---------------+---------+-----------+----------+--------------+ CFV      Full           Yes      Yes                                 +---------+---------------+---------+-----------+----------+--------------+ SFJ      Full                                                        +---------+---------------+---------+-----------+----------+--------------+ FV Prox  Full                                                        +---------+---------------+---------+-----------+----------+--------------+ FV Mid   Full                                                        +---------+---------------+---------+-----------+----------+--------------+  FV DistalFull                                                        +---------+---------------+---------+-----------+----------+--------------+ PFV      Full                                                        +---------+---------------+---------+-----------+----------+--------------+ POP      Full           Yes      Yes                                 +---------+---------------+---------+-----------+----------+--------------+ PTV      Full                                                        +---------+---------------+---------+-----------+----------+--------------+ PERO     Full                                                         +---------+---------------+---------+-----------+----------+--------------+     Summary: BILATERAL: - No evidence of deep vein thrombosis seen in the lower extremities, bilaterally. -No evidence of popliteal cyst, bilaterally.   *See table(s) above for measurements and observations. Electronically signed by Deitra Mayo MD on 03/13/2021 at 3:56:56 PM.    Final      LOS: 3 days   Antonieta Pert, MD [facility]  03/15/2021, 7:17 AM

## 2021-03-15 NOTE — Plan of Care (Signed)

## 2021-03-15 NOTE — Consult Note (Signed)
Palliative Care Consult Note                                  Date: 03/15/2021   Patient Name: Deborah Shields  DOB: 03/11/1941  MRN: [id number]  Age / Sex: 80 y.o., female  PCP: Lajean Manes, MD Referring Physician: Antonieta Pert, MD  Reason for Consultation: Establishing goals of care  HPI/Patient Profile: Palliative Care consult requested for goals of care discussion in this 80 y.o. female  with past medical history of atrial fibrillation (Eliquis), tachybrady syndrome s/p PPM, CKD III, hypertension, PE, anemia, and hyperlipidemia. She was admitted on 03/12/2021 from home with altered mental status and s/p fall after daughter found patient on the floor hallucinating and talking about snakes crawling. UA positive for UTI, CT of head and C-spine showed no acute abnormality. CT of left hip showing acutely mildly displaced and comminuted fracture through the left greater trochanter. Orthopedic is following. No surgical interventions. Request medical management.   Past Medical History:  Diagnosis Date   Anxiety    Aortic insufficiency    mild due to degenerative changes   Arthritis    Basal cell carcinoma 2007   [facility] Left leg   Carotid artery occlusion    Chronic kidney disease    CRD Stage 3   Conjunctivitis due to adenovirus, both eyes    Degenerative joint disease    Diverticulosis of colon    Dyspnea    with exertion   Heart murmur    Hyperlipidemia    NMR 07/2009: LDL 200 (2260/1233)TG 99, HDL 65. LDL goal =<120, ideally <100. father MI @ 24   Hypertension    Hypothyroidism    Dr Wilson Singer   Microscopic hematuria    Peripheral neuropathy    compressive in UE bilaterally; Dr Daylene Katayama   Peripheral vascular disease ([place])    ICA bilat, Dr.Charles fields, [facility]   Pneumonia    2017   PONV (postoperative nausea and vomiting)    "Inner ear, does  okay with Scopolamine"   Pulmonary embolus ([place])    Rectocele    [facility]  spotted fever      Subjective:   This NP Osborne Oman reviewed medical records, received report from team, assessed the patient and then met at the patient's bedside with patient and her daughter, [name] to discuss diagnosis, prognosis, GOC, EOL wishes disposition and options.  Patient awake and alert. Denies pain or discomfort. Eating lunch.    Concept of Palliative Care was introduced as specialized medical care for people and their families living with serious illness.  It focuses on providing relief from the symptoms and stress of a serious illness.  The goal is to improve quality of life for both the patient and the family. Values and goals of care important to patient and family were attempted to be elicited.  Space and opportunity created for patient and family to explore state of health prior to admission, thoughts, and feelings. Patient lived in the home alone prior to admission. Daughter shares patient continues to have falls and presents concerns for ability to care for herself. Patient was driving and able to perform simple task around the home. Her daughter lives next door.   [name] speaks to the concern of noticeable changes in her mom. She states patient has always been well kept with a neat and clean home however she now has seemed more  dis-shelved. She shares patient generally pays her own bills however there are stacks of papers etc over the desk and she is unsure if this has been happening, however patient refuses to notify daughter of where her checking account information is.   We discussed Her current illness and what it means in the larger context of Her on-going co-morbidities. Natural disease trajectory and expectations were discussed.  Patient and daughter verbalized understanding of current illness and co-morbidities.   Patient seems frustrated with her overall condition and shares that providers and her daughter is making it out more than it is. She states she is  planning to return home and continues to deny driving complications, frequent falls, and inability to live alone.   I discussed with patient she is unable to return home alone given her left hip fracture and precautions that are in place. She is not appreciative of this discussion expressing she has rights and can go home if she wants regardless of what anyone thinks.   I spoke with daughter at length privately. [name] is tearful expressing her noticeable decline in patient and concerns for her safety. She worries patient will have another fracture or even hit her head due to falls. She state there has been obvious signs of questionable underlying memory and behavior changes as patient can become quite aggressive in her conversation and actions. Support provide.   I discussed the importance of continued conversation with family and their medical providers regarding overall plan of care and treatment options, ensuring decisions are within the context of the patients values and GOCs.  Questions and concerns were addressed. The family was encouraged to call with questions or concerns.  PMT will continue to support holistically as needed.  Life Review: Lives alone. She has one daughter and 2 grandchildren. Patient is a retired Web designer. Christian faith.    Objective:   Primary Diagnoses: Present on Admission:  (Resolved) Hip fracture (HCC)  PAF (paroxysmal atrial fibrillation) (HCC)  AKI (acute kidney injury) ([place])   Scheduled Meds:  apixaban  2.5 mg Oral BID   B-complex with vitamin C  1 tablet Oral QPC supper   cholecalciferol  1,000 Units Oral Daily   feeding supplement  237 mL Oral BID BM   flecainide  75 mg Oral BID   levothyroxine  88 mcg Oral QAC breakfast   metoprolol succinate  100 mg Oral Daily   senna  1 tablet Oral QODAY    Continuous Infusions:  sodium chloride 75 mL/hr at 03/14/21 2126   cefTRIAXone (ROCEPHIN)  IV 1 g (03/15/21 0501)   metronidazole 500  mg (03/15/21 0551)    PRN Meds: cyclobenzaprine, hydrALAZINE, HYDROcodone-acetaminophen, LORazepam, meclizine, morphine injection, traMADol  Allergies  Allergen Reactions   Aspirin Nausea Only and Other (See Comments)    REACTION: GI upset  ( pt can take 81 mg but NOT   325 mg ASA )   Lovastatin Nausea Only and Other (See Comments)    REACTION: nausea   Benazepril Hcl Other (See Comments)    Unknown   Febuxostat     Other reaction(s): angioedema   Hydrocodone-Acetaminophen     Other reaction(s): irritable   Paroxetine Other (See Comments)    Unknown   Tape Other (See Comments)   Valacyclovir Hcl Nausea And Vomiting   Bupropion Hcl Other (See Comments)    REACTION: tinnitis   Ezetimibe Nausea And Vomiting and Other (See Comments)    REACTION: GI symptoms   Fenofibrate Other (See Comments)  Myalgia    Pravastatin Sodium Other (See Comments)    REACTION: elevated CPK - Muscle's in bilateral Leg    Review of Systems  Neurological:  Positive for weakness.  Unless otherwise noted, a complete review of systems is negative.  Physical Exam General: NAD, elderly female Cardiovascular: regular rate and rhythm Pulmonary:diminished bilaterally  Abdomen: soft, nontender, + bowel sounds Extremities: no edema, no joint deformities Skin: no rashes, warm and dry Neurological: AAO x2, somewhat agitated but cooperative   Vital Signs:  BP 132/79   Pulse 63   Temp 98.2 F (36.8 C) (Oral)   Resp 16   SpO2 97%  Pain Scale: 0-10   Pain Score: 0-No pain  SpO2: SpO2: 97 % O2 Device:SpO2: 97 % O2 Flow Rate: .   IO: Intake/output summary:  Intake/Output Summary (Last 24 hours) at 03/15/2021 1527 Last data filed at 03/15/2021 9735 Gross per 24 hour  Intake 1705.76 ml  Output 1800 ml  Net -94.24 ml    LBM: Last BM Date: 03/13/21 Baseline Weight:   Most recent weight:        Palliative Assessment/Data: PPS 30%   Advanced Care Planning:   Primary Decision  Maker: HCPOA  Code Status/Advance Care Planning: DNR  A discussion was had today regarding advanced directives. Concepts specific to code status, artifical feeding and hydration, continued IV antibiotics and rehospitalization was had.    Patient has an advanced directive. Her daughter Lynelle Smoke is her HCPOA. Patient and daughter confirms DNR/DNI. She would not want any forms artificial feeding/PEG.   Daughter is clear in expressed goals to continue to treat the treatable. She is concerned about her mother returning home and is unable to provide the level of care she needs at this time due to her own health concerns. If patient was to decline [name] would not want her mother to suffer. She is hopeful for SNF rehab with plans to continuously evaluate for further needs and possibility of transitioning into long-term care if necessary.   Decisions/Changes to ACP: DNR/DNI No artificial feeding/PEG  Assessment & Plan:   SUMMARY OF RECOMMENDATIONS   DNR/DN-as confirmed by patient and daughter Continue with current plan of care, treat the treatable. Daughter is remaining somewhat hopeful for improvement however is appropriately concerned on patient's safety and inability to return home despite patient expressing this is her wishes. Patient lives alone and cannot safely care for herself at this time. Daughter is hopeful for SNF rehab with intentions on evaluating for further long-term care needs. If patient was to decline or show no meaningful improvement consideration for comfort focused care would be made.  PMT will continue to support and follow as needed. Please call team line with urgent needs.  Symptom Management:  Per Attending  Palliative Prophylaxis:  Aspiration, Bowel Regimen, Delirium Protocol, Eye Care, Frequent Pain Assessment, Oral Care, and Turn Reposition  Additional Recommendations (Limitations, Scope, Preferences): Full Scope Treatment, No Artificial Feeding, and No  Tracheostomy  Psycho-social/Spiritual:  Desire for further Chaplaincy support: no Additional Recommendations:  Ongoing goals of care discussion  Prognosis:  Guarded  Discharge Planning:  To Be Determined   Daughter expressed understanding and was in agreement with this plan.   Time In: 1230 Time Out: 1335 Time Total: 65 min.   Visit consisted of counseling and education dealing with the complex and emotionally intense issues of symptom management and palliative care in the setting of serious and potentially life-threatening illness.Greater than 50%  of this time was spent counseling and  coordinating care related to the above assessment and plan.  Signed by:  Alda Lea, AGPCNP-BC Palliative Medicine Team  Phone: [phone] Pager: [phone] Amion: Bjorn Pippin   Thank you for allowing the Palliative Medicine Team to assist in the care of this patient. Please utilize secure chat with additional questions, if there is no response within 30 minutes please call the above phone number. Palliative Medicine Team providers are available by phone from 7am to 5pm daily and can be reached through the team cell phone.  Should this patient require assistance outside of these hours, please call the patient's attending physician.

## 2021-03-15 NOTE — Care Management Important Message (Signed)
Important Message  Patient Details  Name: Deborah Shields MRN: [id number] Date of Birth: 1940/12/10   Medicare Important Message Given:  Yes     Orbie Pyo 03/15/2021, 2:47 PM

## 2021-03-16 DIAGNOSIS — S72115A Nondisplaced fracture of greater trochanter of left femur, initial encounter for closed fracture: Secondary | ICD-10-CM | POA: Diagnosis not present

## 2021-03-16 LAB — BASIC METABOLIC PANEL
Anion gap: 8 (ref 5–15)
BUN: 29 mg/dL — ABNORMAL HIGH (ref 8–23)
CO2: 22 mmol/L (ref 22–32)
Calcium: 8.5 mg/dL — ABNORMAL LOW (ref 8.9–10.3)
Chloride: 107 mmol/L (ref 98–111)
Creatinine, Ser: 1.91 mg/dL — ABNORMAL HIGH (ref 0.44–1.00)
GFR, Estimated: 26 mL/min — ABNORMAL LOW (ref >60)
Glucose, Bld: 90 mg/dL (ref 70–99)
Potassium: 3.7 mmol/L (ref 3.5–5.1)
Sodium: 137 mmol/L (ref 135–145)

## 2021-03-16 LAB — CBC
HCT: 30.9 % — ABNORMAL LOW (ref 36.0–46.0)
Hemoglobin: 10.1 g/dL — ABNORMAL LOW (ref 12.0–15.0)
MCH: 29.8 pg (ref 26.0–34.0)
MCHC: 32.7 g/dL (ref 30.0–36.0)
MCV: 91.2 fL (ref 80.0–100.0)
Platelets: 267 10*3/uL (ref 150–400)
RBC: 3.39 MIL/uL — ABNORMAL LOW (ref 3.87–5.11)
RDW: 15.8 % — ABNORMAL HIGH (ref 11.5–15.5)
WBC: 7.4 10*3/uL (ref 4.0–10.5)
nRBC: 0 % (ref 0.0–0.2)

## 2021-03-16 MED ORDER — METRONIDAZOLE 500 MG PO TABS
500.0000 mg | ORAL_TABLET | Freq: Three times a day (TID) | ORAL | Status: AC
  Administered 2021-03-16 – 2021-03-19 (×9): 500 mg via ORAL
  Filled 2021-03-16 (×9): qty 1

## 2021-03-16 MED ORDER — POTASSIUM CHLORIDE CRYS ER 20 MEQ PO TBCR
20.0000 meq | EXTENDED_RELEASE_TABLET | Freq: Every day | ORAL | Status: DC
  Administered 2021-03-16 – 2021-03-19 (×4): 20 meq via ORAL
  Filled 2021-03-16 (×4): qty 1

## 2021-03-16 MED ORDER — CEPHALEXIN 500 MG PO CAPS
500.0000 mg | ORAL_CAPSULE | Freq: Two times a day (BID) | ORAL | Status: DC
  Administered 2021-03-17 – 2021-03-19 (×5): 500 mg via ORAL
  Filled 2021-03-16 (×6): qty 1

## 2021-03-16 NOTE — Plan of Care (Signed)

## 2021-03-16 NOTE — Progress Notes (Signed)
OT Cancellation Note  Patient Details Name: Nyajah Hyson MRN: [id number] DOB: 1941/03/15   Cancelled Treatment:    Reason Eval/Treat Not Completed: Patient declined, no reason specified Pt eating lunch on OT entry. Daughter present and requested OT come back in 30 minutes. Will follow-up as schedule permits.   Layla Maw 03/16/2021, 12:10 PM

## 2021-03-16 NOTE — Progress Notes (Signed)
PROGRESS NOTE    Deborah Shields  QJJ:[id number] DOB: Jan 08, 1941 DOA: 03/12/2021 PCP: Lajean Manes, MD   Brief Narrative/Hospital Course: 80 year old female with PAF on Eliquis, tachybradycardia syndrome post PPM, CKD 3B, HLD, HTN, hypothyroidism, PVD, history of PE, carotid stenosis, anemia was found on the floor confused hallucinating and crawling by the daughter and brought to the ED for evaluation.  Patient was last normal at her baseline all day earlier in the evening 9/18.   In the ED found to have AKI creatinine up fom 1.9-2.4 previously 1.4 baseline. UA WBC more than 50 suspecting UTI urine culture sent, chest x-ray no acute finding CT head and C-spine negative for acute finding, CT of the left hip acute mildly displaced and comminuted fracture through the left greater trochanter, also with thickening of the sigmoid colon and rectum with mild pericolonic edema suspicious for colitis/proctitis, orthopedic was consulted IV fluid was given, placed on antibiotic and admitted for further management of UTI, left greater Trochanteric fracture, sigmoid colitis/proctitis, AKI.   Subjective: Seen examined No new complaints Daughter at bedside  Assessment & Plan:  E Coli UTI POA: change to keflex.  Proctitis/colitis: Noted on the CT abdomen.  Diet as tolerated, continue keflex/Flagylx 7 days.   Left greater trochanteric fracture seen by orthopedic, plan is for nonoperative management. PT OT eval, completed WBAT , planning for skilled nursing facility.  FU with Dr. Ninfa Linden in 2 weeks  AKI on CKD 3b Mild anion gap metabolic acidosis-resolved: Baseline creatinine 1.5 but recently 1.9 on admission 2.4.  Improved 1.9 likely baseline.  Tolerating p.o. well, wean off IV fluids.  Recent Labs  Lab 03/12/21 2142 03/13/21 1603 03/14/21 0034 03/15/21 0030 03/16/21 0022  BUN 39* 40* 39* 33* 29*  CREATININE 2.44* 2.48* 2.29* 1.90* 1.91*     Acute metabolic encephalopathy MCI- memory  issues with confusion-likely dementia Multiple falls rule out syncope: Suspecting metabolic encephalopathy, syncope work-up initiated on admission but no witnessed syncope was found on the floor confused.  CK normal, 2D echo normal EF, EEG no seizure.  Work up unrevealing with CT head, B12, ammonia, alcohol level.  Had D-dimer done work-up of  syncope and was elevated but no respiratory symptoms or chest pain or leg edema-do not suspect VTE- and she is already anticoagulated. Duplex leg neg. continue PT OT  PAF continue on Eliquis metoprolol and flecainide Tachybradycardia syndrome with pacemaker in place QT prolongation monitor Goal of care DNR , discussed with daughter at the bedside monitor closely elderly at risk of decompensation requires admission. PMT consulted-appreciate input. Severe protein calorie malnutrition:RD following plan as below with diet augmentation Nutrition Problem: Severe Malnutrition Etiology: chronic illness (CKD) Signs/Symptoms: severe muscle depletion, severe fat depletion Interventions: Ensure Enlive (each supplement provides 350kcal and 20 grams of protein)     Diet Order             Diet regular Room service appropriate? Yes; Fluid consistency: Thin  Diet effective now                  Pressure Injury 03/13/21 Coccyx Medial Stage 2 -  Partial thickness loss of dermis presenting as a shallow open injury with a red, pink wound bed without slough. (Active)  03/13/21 1913  Location: Coccyx  Location Orientation: Medial  Staging: Stage 2 -  Partial thickness loss of dermis presenting as a shallow open injury with a red, pink wound bed without slough.  Wound Description (Comments):   Present on Admission: Yes  DVT prophylaxis: apixaban (ELIQUIS) tablet 2.5 mg Start: 03/13/21 1145 Code Status:   Code Status: DNR  Family Communication: plan of care discussed with patient and daughter  at bedside.  Status is: Inpatient Remains inpatient appropriate  because:IV treatments appropriate due to intensity of illness or inability to take PO and Inpatient level of care appropriate due to severity of illness Dispo: The patient is from: Home              Anticipated d/c is to: SNF once approved               Patient currently is not medically stable to d/c.   Difficult to place patient No  Objective: Vitals: Today's Vitals   03/16/21 0309 03/16/21 0331 03/16/21 0557 03/16/21 0730  BP:  (!) 199/95    Pulse:  66    Resp: 17 15 17    Temp:  98.1 F (36.7 C)  (!) 97.4 F (36.3 C)  TempSrc:  Oral  Oral  SpO2:  99%    PainSc: 0-No pain  5     Examination: General exam: AAOx 2-3,older than stated age, weak appearing. HEENT:Oral mucosa moist, Ear/Nose WNL grossly, dentition normal. Respiratory system: bilaterally clear to, no use of accessory muscle Cardiovascular system: S1 & S2 +, No JVD,. Gastrointestinal system: Abdomen soft, NT,ND, BS+ Nervous System:Alert, awake, moving extremities and grossly nonfocal Extremities: no edema, distal peripheral pulses palpable.  Skin: No rashes,no icterus. MSK: Normal muscle bulk,tone, power    Intake/Output Summary (Last 24 hours) at 03/16/2021 0731 Last data filed at 03/16/2021 0336 Gross per 24 hour  Intake --  Output 650 ml  Net -650 ml    There were no vitals filed for this visit. Weight change:    Consultants:see note  Procedures:see note Antimicrobials: Anti-infectives (From admission, onward)    Start     Dose/Rate Route Frequency Ordered Stop   03/13/21 0615  cefTRIAXone (ROCEPHIN) 1 g in sodium chloride 0.9 % 100 mL IVPB        1 g 200 mL/hr over 30 Minutes Intravenous Daily 03/13/21 0608     03/13/21 0615  metroNIDAZOLE (FLAGYL) IVPB 500 mg        500 mg 100 mL/hr over 60 Minutes Intravenous Every 12 hours 03/13/21 0608        Culture/Microbiology    Component Value Date/Time   SDES IN/OUT CATH URINE 03/13/2021 0021   SPECREQUEST  03/13/2021 0021    NONE Performed at  [facility], [place] [address], [place], [place] [address]    CULT >=100,000 COLONIES/mL ESCHERICHIA COLI (A) 03/13/2021 0021   REPTSTATUS 03/15/2021 FINAL 03/13/2021 0021    Other culture-see note  Unresulted Labs (From admission, onward)    None     Medications reviewed:  Scheduled Meds:  apixaban  2.5 mg Oral BID   B-complex with vitamin C  1 tablet Oral QPC supper   cholecalciferol  1,000 Units Oral Daily   feeding supplement  237 mL Oral BID BM   flecainide  75 mg Oral BID   levothyroxine  88 mcg Oral QAC breakfast   metoprolol succinate  100 mg Oral Daily   senna  1 tablet Oral QODAY   Continuous Infusions:  sodium chloride 75 mL/hr at 03/14/21 2126   cefTRIAXone (ROCEPHIN)  IV 1 g (03/16/21 0601)   metronidazole 500 mg (03/16/21 0646)     Intake/Output from previous day: 09/22 0701 - 09/23 0700 In: -  Out: 650 [Urine:650]  Intake/Output this shift: No intake/output data recorded. There were no vitals filed for this visit. Data Reviewed: I have personally reviewed following labs and imaging studies CBC: Recent Labs  Lab 03/12/21 2142 03/14/21 0034 03/15/21 0030 03/16/21 0022  WBC 7.2 8.2 7.1 7.4  NEUTROABS 5.9  --   --   --   HGB 12.1 11.2* 9.4* 10.1*  HCT 38.6 34.7* 29.6* 30.9*  MCV 95.1 92.3 92.8 91.2  PLT 202 225 221 010    Basic Metabolic Panel: Recent Labs  Lab 03/12/21 2142 03/13/21 0605 03/13/21 1603 03/14/21 0034 03/15/21 0030 03/16/21 0022  NA 135  --  137 138 137 137  K 4.0  --  3.9 3.6 3.7 3.7  CL 104  --  106 107 110 107  CO2 19*  --  20* 20* 20* 22  GLUCOSE 109*  --  98 89 98 90  BUN 39*  --  40* 39* 33* 29*  CREATININE 2.44*  --  2.48* 2.29* 1.90* 1.91*  CALCIUM 9.3  --  8.3* 8.5* 8.3* 8.5*  MG  --  2.1  --   --   --   --     GFR: CrCl cannot be calculated (Unknown ideal weight.). Liver Function Tests: Recent Labs  Lab 03/12/21 2142  AST 26  ALT 13  ALKPHOS 108  BILITOT 1.3*  PROT 6.7  ALBUMIN 2.9*    No  results for input(s): LIPASE, AMYLASE in the last 168 hours. Recent Labs  Lab 03/12/21 2144  AMMONIA 14    Coagulation Profile: Recent Labs  Lab 03/12/21 2142  INR 1.5*    Cardiac Enzymes: Recent Labs  Lab 03/13/21 0605  CKTOTAL 120    BNP (last 3 results) No results for input(s): PROBNP in the last 8760 hours. HbA1C: No results for input(s): HGBA1C in the last 72 hours. CBG: No results for input(s): GLUCAP in the last 168 hours. Lipid Profile: No results for input(s): CHOL, HDL, LDLCALC, TRIG, CHOLHDL, LDLDIRECT in the last 72 hours. Thyroid Function Tests: No results for input(s): TSH, T4TOTAL, FREET4, T3FREE, THYROIDAB in the last 72 hours. Anemia Panel: No results for input(s): VITAMINB12, FOLATE, FERRITIN, TIBC, IRON, RETICCTPCT in the last 72 hours.  Sepsis Labs: No results for input(s): PROCALCITON, LATICACIDVEN in the last 168 hours.  Recent Results (from the past 240 hour(s))  Resp Panel by RT-PCR (Flu A&B, Covid) Nasopharyngeal Swab     Status: None   Collection Time: 03/12/21  8:51 PM   Specimen: Nasopharyngeal Swab; Nasopharyngeal(NP) swabs in vial transport medium  Result Value Ref Range Status   SARS Coronavirus 2 by RT PCR NEGATIVE NEGATIVE Final    Comment: (NOTE) SARS-CoV-2 target nucleic acids are NOT DETECTED.  The SARS-CoV-2 RNA is generally detectable in upper respiratory specimens during the acute phase of infection. The lowest concentration of SARS-CoV-2 viral copies this assay can detect is 138 copies/mL. A negative result does not preclude SARS-Cov-2 infection and should not be used as the sole basis for treatment or other patient management decisions. A negative result may occur with  improper specimen collection/handling, submission of specimen other than nasopharyngeal swab, presence of viral mutation(s) within the areas targeted by this assay, and inadequate number of viral copies(<138 copies/mL). A negative result must be combined  with clinical observations, patient history, and epidemiological information. The expected result is Negative.  Fact Sheet for Patients:  EntrepreneurPulse.com.au  Fact Sheet for Healthcare Providers:  IncredibleEmployment.be  This test is no t yet approved  or cleared by the [place] and  has been authorized for detection and/or diagnosis of SARS-CoV-2 by FDA under an Emergency Use Authorization (EUA). This EUA will remain  in effect (meaning this test can be used) for the duration of the COVID-19 declaration under Section 564(b)(1) of the Act, 21 U.S.C.section 360bbb-3(b)(1), unless the authorization is terminated  or revoked sooner.       Influenza A by PCR NEGATIVE NEGATIVE Final   Influenza B by PCR NEGATIVE NEGATIVE Final    Comment: (NOTE) The Xpert Xpress SARS-CoV-2/FLU/RSV plus assay is intended as an aid in the diagnosis of influenza from Nasopharyngeal swab specimens and should not be used as a sole basis for treatment. Nasal washings and aspirates are unacceptable for Xpert Xpress SARS-CoV-2/FLU/RSV testing.  Fact Sheet for Patients: EntrepreneurPulse.com.au  Fact Sheet for Healthcare Providers: IncredibleEmployment.be  This test is not yet approved or cleared by the [place] FDA and has been authorized for detection and/or diagnosis of SARS-CoV-2 by FDA under an Emergency Use Authorization (EUA). This EUA will remain in effect (meaning this test can be used) for the duration of the COVID-19 declaration under Section 564(b)(1) of the Act, 21 U.S.C. section 360bbb-3(b)(1), unless the authorization is terminated or revoked.  Performed at [facility], [place] [address] [place], [place] [address]   Urine Culture     Status: Abnormal   Collection Time: 03/13/21 12:21 AM   Specimen: In/Out Cath Urine  Result Value Ref Range Status   Specimen Description IN/OUT CATH URINE   Final   Special Requests   Final    NONE Performed at [facility], [place] [address], [place], [place] [address]    Culture >=100,000 COLONIES/mL ESCHERICHIA COLI (A)  Final   Report Status 03/15/2021 FINAL  Final   Organism ID, Bacteria ESCHERICHIA COLI (A)  Final      Susceptibility   Escherichia coli - MIC*    AMPICILLIN >=32 RESISTANT Resistant     CEFAZOLIN 16 SENSITIVE Sensitive     CEFEPIME <=0.12 SENSITIVE Sensitive     CEFTRIAXONE <=0.25 SENSITIVE Sensitive     CIPROFLOXACIN <=0.25 SENSITIVE Sensitive     GENTAMICIN <=1 SENSITIVE Sensitive     IMIPENEM <=0.25 SENSITIVE Sensitive     NITROFURANTOIN <=16 SENSITIVE Sensitive     TRIMETH/SULFA <=20 SENSITIVE Sensitive     AMPICILLIN/SULBACTAM >=32 RESISTANT Resistant     PIP/TAZO <=4 SENSITIVE Sensitive     * >=100,000 COLONIES/mL ESCHERICHIA COLI      Radiology Studies: No results found.   LOS: 4 days   Antonieta Pert, MD [facility]  03/16/2021, 7:31 AM

## 2021-03-16 NOTE — Evaluation (Signed)
Occupational Therapy Evaluation Patient Details Name: Deborah Shields MRN: [id number] DOB: [birth year] Today's Date: 03/16/2021   History of Present Illness 80 y.o. female presents to [facility] ED on 03/12/2021 after being found down and hallucinating by daughter. Pt found to have L greater trochanter fx and UTI. PMH includes PAF, tachybrady syndrome status post PPM, CKD stage IIIb, hyperlipidemia, hypertension, hypothyroidism, PVD, history of PE, carotid stenosis, anemia.   Clinical Impression   PTA, pt lives alone and reports Modified Independence with ADLs, IADLs and mobility using Rollator vs no AD. Hx of recent falls. Pt presents now with minor deficits in balance, endurance, strength and cognition. Pt overall min A for mobility using RW, limited by lethargic secondary to medications this PM. Pt Independent for UB ADLs and Min A for LB ADLs. At this time, pt below functional baseline and at increased risk for falls. Recommend SNF rehab at DC with pt/family in agreement.   BP pre activity: 162/88 BP post activity: 163/85      Recommendations for follow up therapy are one component of a multi-disciplinary discharge planning process, led by the attending physician.  Recommendations may be updated based on patient status, additional functional criteria and insurance authorization.   Follow Up Recommendations  SNF;Supervision - Intermittent    Equipment Recommendations  Other (comment) (Rolling walker)    Recommendations for Other Services       Precautions / Restrictions Precautions Precautions: Fall Restrictions Weight Bearing Restrictions: Yes Other Position/Activity Restrictions: WBAT L LE      Mobility Bed Mobility Overal bed mobility: Needs Assistance Bed Mobility: Supine to Sit;Sit to Supine     Supine to sit: Min guard;HOB elevated Sit to supine: Min guard   General bed mobility comments: min guard for safety, increased time    Transfers Overall transfer level:  Needs assistance Equipment used: Rolling walker (2 wheeled) Transfers: Sit to/from Stand Sit to Stand: Min guard         General transfer comment: overall min guard from bed and BSC over toilet. cues for hand placement with good carryover    Balance Overall balance assessment: Needs assistance Sitting-balance support: No upper extremity supported;Feet supported Sitting balance-Leahy Scale: Good     Standing balance support: Bilateral upper extremity supported Standing balance-Leahy Scale: Poor Standing balance comment: pt to WB only with use of walker per orders                           ADL either performed or assessed with clinical judgement   ADL Overall ADL's : Needs assistance/impaired Eating/Feeding: Independent   Grooming: Supervision/safety;Standing   Upper Body Bathing: Independent;Sitting   Lower Body Bathing: Minimal assistance;Sit to/from stand   Upper Body Dressing : Independent   Lower Body Dressing: Minimal assistance   Toilet Transfer: Minimal assistance;Ambulation;RW;Regular Toilet;BSC Toilet Transfer Details (indicate cue type and reason): BSC over toilet, Min A to side step in and out of small bathroom Toileting- Clothing Manipulation and Hygiene: Min guard;Sit to/from stand Toileting - Clothing Manipulation Details (indicate cue type and reason): able to complete hygiene seated on toilet     Functional mobility during ADLs: Min guard;Rolling walker;Cueing for sequencing;Cueing for safety General ADL Comments: Minor limitations due to fx and endurance     Vision Baseline Vision/History: 1 Wears glasses Ability to See in Adequate Light: 0 Adequate Patient Visual Report: No change from baseline Vision Assessment?: No apparent visual deficits     Perception  Praxis      Pertinent Vitals/Pain Pain Assessment: Faces Faces Pain Scale: Hurts a little bit Pain Location: L hip Pain Descriptors / Indicators: Sore Pain  Intervention(s): Monitored during session;Premedicated before session     Hand Dominance Right   Extremity/Trunk Assessment Upper Extremity Assessment Upper Extremity Assessment: Generalized weakness   Lower Extremity Assessment Lower Extremity Assessment: Defer to PT evaluation   Cervical / Trunk Assessment Cervical / Trunk Assessment: Kyphotic   Communication Communication Communication: No difficulties   Cognition Arousal/Alertness: Awake/alert;Lethargic;Suspect due to medications Behavior During Therapy: [facility] for tasks assessed/performed Overall Cognitive Status: History of cognitive impairments - at baseline                                 General Comments: dtr reports ~2 year history of cognitive decline. Pt appropriate during session, lethargic due to meds but very eager to participate   General Comments  BP WFL, daughter present during session    Exercises     Shoulder Instructions      Home Living Family/patient expects to be discharged to:: Private residence Living Arrangements: Alone Available Help at Discharge: Family;Available PRN/intermittently Type of Home: House Home Access: Stairs to enter [employer] of Steps: 2 Entrance Stairs-Rails: None Home Layout: One level     Bathroom Shower/Tub: Occupational psychologist: Standard     Home Equipment: Environmental consultant - 4 wheels;Shower seat;Bedside commode          Prior Functioning/Environment Level of Independence: Independent with assistive device(s)        Comments: pt ambulates with rollator but often ambulates without device as well, multiple falls recently. Able to complete ADLs, IADLs, drives, goes shopping        OT Problem List: Decreased strength;Decreased activity tolerance;Impaired balance (sitting and/or standing);Decreased cognition;Decreased safety awareness      OT Treatment/Interventions: Self-care/ADL training;Therapeutic exercise;Energy conservation;DME  and/or AE instruction;Therapeutic activities;Patient/family education;Balance training    OT Goals(Current goals can be found in the care plan section) Acute Rehab OT Goals Patient Stated Goal: go to rehab OT Goal Formulation: With patient Time For Goal Achievement: 03/30/21 Potential to Achieve Goals: Good  OT Frequency: Min 2X/week   Barriers to D/C:            Co-evaluation              AM-PAC OT "6 Clicks" Daily Activity     Outcome Measure Help from another person eating meals?: None Help from another person taking care of personal grooming?: A Little Help from another person toileting, which includes using toliet, bedpan, or urinal?: A Little Help from another person bathing (including washing, rinsing, drying)?: A Little Help from another person to put on and taking off regular upper body clothing?: None Help from another person to put on and taking off regular lower body clothing?: A Little 6 Click Score: 20   End of Session Equipment Utilized During Treatment: Gait belt;Rolling walker  Activity Tolerance: Patient tolerated treatment well Patient left: in bed;with bed alarm set;with call bell/phone within reach;with family/visitor present  OT Visit Diagnosis: Unsteadiness on feet (R26.81);Other abnormalities of gait and mobility (R26.89);Muscle weakness (generalized) (M62.81);History of falling (Z91.81)                Time: 1696-7893 OT Time Calculation (min): 32 min Charges:  OT General Charges $OT Visit: 1 Visit OT Evaluation $OT Eval Moderate Complexity: 1 Mod OT Treatments $  Self Care/Home Management : 8-22 mins  Malachy Chamber, OTR/L Acute Rehab Services Office: [phone]   Layla Maw 03/16/2021, 1:49 PM

## 2021-03-17 DIAGNOSIS — G9340 Encephalopathy, unspecified: Secondary | ICD-10-CM | POA: Diagnosis not present

## 2021-03-17 DIAGNOSIS — F411 Generalized anxiety disorder: Secondary | ICD-10-CM

## 2021-03-17 DIAGNOSIS — S72115A Nondisplaced fracture of greater trochanter of left femur, initial encounter for closed fracture: Secondary | ICD-10-CM | POA: Diagnosis not present

## 2021-03-17 DIAGNOSIS — N179 Acute kidney failure, unspecified: Secondary | ICD-10-CM | POA: Diagnosis not present

## 2021-03-17 MED ORDER — B COMPLEX-C PO TABS: 1.0000 | ORAL_TABLET | Freq: Every day | ORAL | Status: AC

## 2021-03-17 MED ORDER — METRONIDAZOLE 500 MG PO TABS: 500.0000 mg | ORAL_TABLET | Freq: Three times a day (TID) | ORAL | 0 refills | Status: DC

## 2021-03-17 MED ORDER — ENSURE ENLIVE PO LIQD: 237.0000 mL | Freq: Two times a day (BID) | ORAL | 12 refills | Status: AC

## 2021-03-17 MED ORDER — TRAMADOL HCL 50 MG PO TABS: 25.0000 mg | ORAL_TABLET | Freq: Every day | ORAL | 0 refills | Status: DC | PRN

## 2021-03-17 MED ORDER — CEPHALEXIN 500 MG PO CAPS: 500.0000 mg | ORAL_CAPSULE | Freq: Two times a day (BID) | ORAL | 0 refills | Status: DC

## 2021-03-17 MED ORDER — LORAZEPAM 0.5 MG PO TABS: 0.5000 mg | ORAL_TABLET | Freq: Three times a day (TID) | ORAL | 0 refills | Status: AC | PRN

## 2021-03-17 NOTE — Progress Notes (Signed)
   Daily Progress Note   Patient Name: Deborah Shields       Date: 03/17/2021 DOB: 05/08/1941  Age: 80 y.o. MRN#: [id number] Attending Physician: Antonieta Pert, MD Primary Care Physician: Lajean Manes, MD Admit Date: 03/12/2021  Reason for Consultation/Follow-up: Establishing goals of care  Subjective: Chart Reviewed. Updates Received. Patient Assessed.   Patient sitting up in bed. Denies pain or shortness of breath. Is preparing to eat lunch. Speaks to her increased appetite and appreciative of her food choices.  Daughter is at the bedside.   Reviewed previous goals of care discussions. Deborah Shields and her daughter are remaining hopeful for some improvement. Patient is in agreement with Rehab. She continues to express wishes for [facility] as she is close friends with the family. Education provided on referral process and assured family that [facility] will assist in this process.   Goals remain clear to continue to treat the treatable with plans for SNF rehab and outpatient palliative support. Daughter will continue to evaluate patient's progress once at facility with possibility of transitioning patient a more long-term care resident. DNR/DNI confirmed.   All questions answered and support provided.   Length of Stay: 5 days  Vital Signs: BP (!) 173/80 (BP Location: Left Arm)   Pulse 64   Temp 98 F (36.7 C) (Oral)   Resp 16   SpO2 97%  SpO2: SpO2: 97 % O2 Device: O2 Device: Room Air O2 Flow Rate:    Physical Exam: AAO x3, NAD, elderly female RRR Diminished bilaterally Mood appropriate               Palliative Care Assessment & Plan  HPI: Palliative Care consult requested for goals of care discussion in this 80 y.o. female  with past medical history of atrial fibrillation (Eliquis), tachybrady syndrome s/p PPM, CKD III, hypertension, PE, anemia, and hyperlipidemia. She was admitted on 03/12/2021 from home with altered mental status and s/p fall after daughter found patient on the  floor hallucinating and talking about snakes crawling. UA positive for UTI, CT of head and C-spine showed no acute abnormality. CT of left hip showing acutely mildly displaced and comminuted fracture through the left greater trochanter. Orthopedic is following. No surgical interventions. Request medical management.   Code Status: DNR  Goals of Care/Recommendations: Continue to treat the treatable Goals are for SNF rehab and daughter plans to evaluate for future home care vs facility needs. Outpatient Palliative support (TOC referral placed)  Please reach out if any questions arise for now Palliative will follow on as needed basis please call if any questions or concerns arise.    Prognosis: Unable to determine  Discharge Planning: [facility] for rehab with Palliative care service follow-up  Thank you for allowing the Palliative Medicine Team to assist in the care of this patient.  Time Total: 40 min.   Visit consisted of counseling and education dealing with the complex and emotionally intense issues of symptom management and palliative care in the setting of serious and potentially life-threatening illness.Greater than 50%  of this time was spent counseling and coordinating care related to the above assessment and plan.  Alda Lea, AGPCNP-BC  Palliative Medicine Team [phone]

## 2021-03-17 NOTE — TOC Progression Note (Signed)
Transition of Care [facility]) - Progression Note    Patient Details  Name: Misako Roeder MRN: [id number] Date of Birth: 10/11/1940  Transition of Care [facility]) CM/SW [place], [place] Phone Number: [phone] 03/17/2021, 12:14 PM  Clinical Narrative:     CSW met with pt and pt's daughter Lynelle Smoke to discuss the bed offers. CSW provided them with the Medicare.gov listing  with ratings.  They chose [facility] as first choice and [facility] as second choice.  Pt's vaccine history; 09/22/2020- 1st vaccine 10/18/2019- 2nd vaccine 06/01/2020- booster  CSW spoke with Claiborne Billings at [facility] and she confirmed that they can accept pt on Monday. Pt had several questions which CSW received answers. Pt will need COVID to DC to facility.  CSW updated MD on pt's DC status.   TOC team will continue to assist with discharge planning needs.        Expected Discharge Plan and Services           Expected Discharge Date: 03/17/21                                     Social Determinants of Health (SDOH) Interventions    Readmission Risk Interventions No flowsheet data found.

## 2021-03-17 NOTE — Plan of Care (Signed)

## 2021-03-17 NOTE — Discharge Summary (Addendum)
Physician Discharge Summary  Debar Plate [facility]:[id number] DOB: [birth year] DOA: 03/12/2021  PCP: Lajean Manes, MD  Admit date: 03/12/2021 Discharge date: 03/19/2021  Admitted From: home Disposition:  snf  Recommendations for Outpatient Follow-up:  Follow up with PCP in 1-2 weeks and DR [facility] IN 2 WK Please obtain BMP/CBC in one week Please follow up on the following pending results:  Home Health:no  Equipment/Devices: none  Discharge Condition: Stable Code Status:   Code Status: DNR Diet recommendation:  Diet Order             Diet regular Room service appropriate? Yes; Fluid consistency: Thin  Diet effective now                    Brief/Interim Summary: 80 year old female with PAF on Eliquis, tachybradycardia syndrome post PPM, CKD 3B, HLD, HTN, hypothyroidism, PVD, history of PE, carotid stenosis, anemia was found on the floor confused hallucinating and crawling by the daughter and brought to the ED for evaluation.  Patient was last normal at her baseline all day earlier in the evening 9/18.   In the ED found to have AKI creatinine up fom 1.9-2.4 previously 1.4 baseline. UA WBC more than 50 suspecting UTI urine culture sent, chest x-ray no acute finding CT head and C-spine negative for acute finding, CT of the left hip acute mildly displaced and comminuted fracture through the left greater trochanter, also with thickening of the sigmoid colon and rectum with mild pericolonic edema suspicious for colitis/proctitis, orthopedic was consulted IV fluid was given, placed on antibiotic and admitted for further management of UTI, left greater Trochanteric fracture, sigmoid colitis/proctitis, AKI Treated with antibiotics at this time says clinically improved seen by orthopedic nonoperative management advised, seen by PT OT and planning for skilled nursing facility then outpatient follow-up with orthopedics  Discharge Diagnoses:  E Coli UTI POA: changed to keflex.    Proctitis/colitis: Noted on the CT abdomen.  Diet as tolerated, continue keflex/Flagylx 7 days.   Left greater trochanteric fracture seen by orthopedic, plan is for nonoperative management. PT OT eval, completed WBAT , planning for skilled nursing facility.  FU with Dr. Ninfa Linden in 2 weeks   AKI on CKD 3b Mild anion gap metabolic acidosis-resolved: Baseline creatinine 1.5 but recently 1.9 on admission 2.4.  Improved to 1.9.  She is off IV fluids.  Tolerating p.o.  Recent Labs  Lab 03/12/21 2142 03/13/21 1603 03/14/21 0034 03/15/21 0030 03/16/21 0022  BUN 39* 40* 39* 33* 29*  CREATININE 2.44* 2.48* 2.29* 1.90* 1.91*    Acute metabolic encephalopathy MCI- memory issues with confusion-likely dementia Multiple falls rule out syncope: Suspecting metabolic encephalopathy-mental status close to baseline with baseline confusion from dementia. Syncope work-up initiated on admission but no witnessed syncope was found on the floor confused.  CK normal, 2D echo normal EF, EEG no seizure.  Work up unrevealing with CT head, B12, ammonia, alcohol level.  Had D-dimer done work-up of  syncope and was elevated but no respiratory symptoms or chest pain or leg edema-do not suspect VTE- and she is already anticoagulated. Duplex leg neg. continue PT OT   PAF continue on Eliquis metoprolol and flecainide Tachybradycardia syndrome with pacemaker in place QT prolongation monitor Goal of care DNR , discussed with daughter at the bedside monitor closely elderly at risk of decompensation requires admission. PMT consulted-appreciate input. Severe protein calorie malnutrition:RD following plan as below with diet augmentation Nutrition Problem: Severe Malnutrition Etiology: chronic illness (CKD) Signs/Symptoms: severe muscle depletion,  severe fat depletion Interventions: Ensure Enlive (each supplement provides 350kcal and 20 grams of protein)  Pressure Ulcer: Pressure Injury 03/13/21 Coccyx Medial Stage 2 -   Partial thickness loss of dermis presenting as a shallow open injury with a red, pink wound bed without slough. (Active)  03/13/21 1913  Location: Coccyx  Location Orientation: Medial  Staging: Stage 2 -  Partial thickness loss of dermis presenting as a shallow open injury with a red, pink wound bed without slough.  Wound Description (Comments):   Present on Admission: Yes    Consults: ortho  Subjective: Alert awake oriented at baseline.  Daughter at the bedside. Discharge Exam: Vitals:   03/19/21 0705 03/19/21 0730  BP:  (!) 160/97  Pulse:  65  Resp: 15 14  Temp:  (!) 97.5 F (36.4 C)  SpO2:  96%   General: Pt is alert, awake, not in acute distress Cardiovascular: RRR, S1/S2 +, no rubs, no gallops Respiratory: CTA bilaterally, no wheezing, no rhonchi Abdominal: Soft, NT, ND, bowel sounds + Extremities: no edema, no cyanosis  Discharge Instructions  Discharge Instructions     Discharge instructions   Complete by: As directed    Orthopedic follow up as OP  Please call call MD or return to ER for similar or worsening recurring problem that brought you to hospital or if any fever,nausea/vomiting,abdominal pain, uncontrolled pain, chest pain,  shortness of breath or any other alarming symptoms.  Please follow-up your doctor as instructed in a week time and call the office for appointment.  Please avoid alcohol, smoking, or any other illicit substance and maintain healthy habits including taking your regular medications as prescribed.  You were cared for by a hospitalist during your hospital stay. If you have any questions about your discharge medications or the care you received while you were in the hospital after you are discharged, you can call the unit and ask to speak with the hospitalist on call if the hospitalist that took care of you is not available.  Once you are discharged, your primary care physician will handle any further medical issues. Please note that NO  REFILLS for any discharge medications will be authorized once you are discharged, as it is imperative that you return to your primary care physician (or establish a relationship with a primary care physician if you do not have one) for your aftercare needs so that they can reassess your need for medications and monitor your lab values   Discharge wound care:   Complete by: As directed    OFF LOADING AND REGULAR dressing per RN   Increase activity slowly   Complete by: As directed       Allergies as of 03/19/2021       Reactions   Aspirin Nausea Only, Other (See Comments)   REACTION: GI upset  ( pt can take 81 mg but NOT   325 mg ASA )   Lovastatin Nausea Only, Other (See Comments)   REACTION: nausea   Benazepril Hcl Other (See Comments)   Unknown   Febuxostat    Other reaction(s): angioedema   Hydrocodone-acetaminophen    Other reaction(s): irritable   Paroxetine Other (See Comments)   Unknown   Tape Other (See Comments)   Valacyclovir Hcl Nausea And Vomiting   Bupropion Hcl Other (See Comments)   REACTION: tinnitis   Ezetimibe Nausea And Vomiting, Other (See Comments)   REACTION: GI symptoms   Fenofibrate Other (See Comments)   Myalgia   Pravastatin Sodium Other (  See Comments)   REACTION: elevated CPK - Muscle's in bilateral Leg        Medication List     TAKE these medications    apixaban 2.5 MG Tabs tablet Commonly known as: Eliquis Take 1 tablet (2.5 mg total) by mouth 2 (two) times daily. APPOINTMENT NEEDED FOR FURTHER REFILLS. CONTACT YOUR CARDIOLOGIST What changed: additional instructions   b complex vitamins tablet Take 1 tablet by mouth daily after supper.   B-complex with vitamin C tablet Take 1 tablet by mouth daily after supper.   cephALEXin 500 MG capsule Commonly known as: KEFLEX Take 1 capsule (500 mg total) by mouth every 12 (twelve) hours for 1 day.   cholecalciferol 25 MCG (1000 UNIT) tablet Commonly known as: VITAMIN D3 Take 1,000 Units by  mouth daily.   cyclobenzaprine 10 MG tablet Commonly known as: FLEXERIL Take 10 mg by mouth 2 (two) times daily as needed for muscle spasms.   diclofenac sodium 1 % Gel Commonly known as: VOLTAREN Apply 2-4 g topically 4 (four) times daily as needed (for pain).   feeding supplement Liqd Take 237 mLs by mouth 2 (two) times daily between meals.   flecainide 50 MG tablet Commonly known as: TAMBOCOR Take 1.5 tablets (75 mg total) by mouth 2 (two) times daily. TAKE 1 TABLET(50 MG) BY MOUTH EVERY 12 HOURS What changed: additional instructions   hydrALAZINE 25 MG tablet Commonly known as: APRESOLINE Take 1 tablet (25 mg total) by mouth every 8 (eight) hours as needed (if systolic bp more than 161 mmhg).   levothyroxine 88 MCG tablet Commonly known as: SYNTHROID Take 88 mcg by mouth daily before breakfast. What changed: Another medication with the same name was removed. Continue taking this medication, and follow the directions you see here.   LORazepam 0.5 MG tablet Commonly known as: ATIVAN Take 1 tablet (0.5 mg total) by mouth 3 (three) times daily as needed for up to 6 doses for anxiety.   meclizine 25 MG tablet Commonly known as: ANTIVERT Take 1 tablet (25 mg total) by mouth daily. 1/2 every 8 hrs prn for imbalance What changed:  how much to take when to take this reasons to take this additional instructions   metoprolol succinate 100 MG 24 hr tablet Commonly known as: TOPROL-XL Take 1 tablet (100 mg total) by mouth daily. Take with or immediately following a meal.   metroNIDAZOLE 500 MG tablet Commonly known as: FLAGYL Take 1 tablet (500 mg total) by mouth every 8 (eight) hours for 1 day.   senna 8.6 MG tablet Commonly known as: SENOKOT Take 1 tablet by mouth every other day.   traMADol 50 MG tablet Commonly known as: ULTRAM Take 0.5-1 tablets (25-50 mg total) by mouth 3 (three) times daily as needed for up to 4 doses for moderate pain. What changed:  how much to  take how to take this when to take this reasons to take this additional instructions               Discharge Care Instructions  (From admission, onward)           Start     Ordered   03/17/21 0000  Discharge wound care:       Comments: OFF LOADING AND REGULAR dressing per RN   03/17/21 0913            Allergies  Allergen Reactions   Aspirin Nausea Only and Other (See Comments)    REACTION: GI upset  (  pt can take 81 mg but NOT   325 mg ASA )   Lovastatin Nausea Only and Other (See Comments)    REACTION: nausea   Benazepril Hcl Other (See Comments)    Unknown   Febuxostat     Other reaction(s): angioedema   Hydrocodone-Acetaminophen     Other reaction(s): irritable   Paroxetine Other (See Comments)    Unknown   Tape Other (See Comments)   Valacyclovir Hcl Nausea And Vomiting   Bupropion Hcl Other (See Comments)    REACTION: tinnitis   Ezetimibe Nausea And Vomiting and Other (See Comments)    REACTION: GI symptoms   Fenofibrate Other (See Comments)    Myalgia    Pravastatin Sodium Other (See Comments)    REACTION: elevated CPK - Muscle's in bilateral Leg    The results of significant diagnostics from this hospitalization (including imaging, microbiology, ancillary and laboratory) are listed below for reference.    Microbiology: Recent Results (from the past 240 hour(s))  Resp Panel by RT-PCR (Flu A&B, Covid) Nasopharyngeal Swab     Status: None   Collection Time: 03/12/21  8:51 PM   Specimen: Nasopharyngeal Swab; Nasopharyngeal(NP) swabs in vial transport medium  Result Value Ref Range Status   SARS Coronavirus 2 by RT PCR NEGATIVE NEGATIVE Final    Comment: (NOTE) SARS-CoV-2 target nucleic acids are NOT DETECTED.  The SARS-CoV-2 RNA is generally detectable in upper respiratory specimens during the acute phase of infection. The lowest concentration of SARS-CoV-2 viral copies this assay can detect is 138 copies/mL. A negative result does not  preclude SARS-Cov-2 infection and should not be used as the sole basis for treatment or other patient management decisions. A negative result may occur with  improper specimen collection/handling, submission of specimen other than nasopharyngeal swab, presence of viral mutation(s) within the areas targeted by this assay, and inadequate number of viral copies(<138 copies/mL). A negative result must be combined with clinical observations, patient history, and epidemiological information. The expected result is Negative.  Fact Sheet for Patients:  EntrepreneurPulse.com.au  Fact Sheet for Healthcare Providers:  IncredibleEmployment.be  This test is no t yet approved or cleared by the [place] FDA and  has been authorized for detection and/or diagnosis of SARS-CoV-2 by FDA under an Emergency Use Authorization (EUA). This EUA will remain  in effect (meaning this test can be used) for the duration of the COVID-19 declaration under Section 564(b)(1) of the Act, 21 U.S.C.section 360bbb-3(b)(1), unless the authorization is terminated  or revoked sooner.       Influenza A by PCR NEGATIVE NEGATIVE Final   Influenza B by PCR NEGATIVE NEGATIVE Final    Comment: (NOTE) The Xpert Xpress SARS-CoV-2/FLU/RSV plus assay is intended as an aid in the diagnosis of influenza from Nasopharyngeal swab specimens and should not be used as a sole basis for treatment. Nasal washings and aspirates are unacceptable for Xpert Xpress SARS-CoV-2/FLU/RSV testing.  Fact Sheet for Patients: EntrepreneurPulse.com.au  Fact Sheet for Healthcare Providers: IncredibleEmployment.be  This test is not yet approved or cleared by the [place] FDA and has been authorized for detection and/or diagnosis of SARS-CoV-2 by FDA under an Emergency Use Authorization (EUA). This EUA will remain in effect (meaning this test can be used) for the  duration of the COVID-19 declaration under Section 564(b)(1) of the Act, 21 U.S.C. section 360bbb-3(b)(1), unless the authorization is terminated or revoked.  Performed at [facility], [place] [address] [place], [place] [address]   Urine  Culture     Status: Abnormal   Collection Time: 03/13/21 12:21 AM   Specimen: In/Out Cath Urine  Result Value Ref Range Status   Specimen Description IN/OUT CATH URINE  Final   Special Requests   Final    NONE Performed at [facility], [address] [place], [place] [address]    Culture >=100,000 COLONIES/mL ESCHERICHIA COLI (A)  Final   Report Status 03/15/2021 FINAL  Final   Organism ID, Bacteria ESCHERICHIA COLI (A)  Final      Susceptibility   Escherichia coli - MIC*    AMPICILLIN >=32 RESISTANT Resistant     CEFAZOLIN 16 SENSITIVE Sensitive     CEFEPIME <=0.12 SENSITIVE Sensitive     CEFTRIAXONE <=0.25 SENSITIVE Sensitive     CIPROFLOXACIN <=0.25 SENSITIVE Sensitive     GENTAMICIN <=1 SENSITIVE Sensitive     IMIPENEM <=0.25 SENSITIVE Sensitive     NITROFURANTOIN <=16 SENSITIVE Sensitive     TRIMETH/SULFA <=20 SENSITIVE Sensitive     AMPICILLIN/SULBACTAM >=32 RESISTANT Resistant     PIP/TAZO <=4 SENSITIVE Sensitive     * >=100,000 COLONIES/mL ESCHERICHIA COLI  SARS CORONAVIRUS 2 (TAT 6-24 HRS) Nasopharyngeal Nasopharyngeal Swab     Status: None   Collection Time: 03/18/21 10:10 AM   Specimen: Nasopharyngeal Swab  Result Value Ref Range Status   SARS Coronavirus 2 NEGATIVE NEGATIVE Final    Comment: (NOTE) SARS-CoV-2 target nucleic acids are NOT DETECTED.  The SARS-CoV-2 RNA is generally detectable in upper and lower respiratory specimens during the acute phase of infection. Negative results do not preclude SARS-CoV-2 infection, do not rule out co-infections with other pathogens, and should not be used as the sole basis for treatment or other patient management decisions. Negative results must be combined with clinical  observations, patient history, and epidemiological information. The expected result is Negative.  Fact Sheet for Patients: SugarRoll.be  Fact Sheet for Healthcare Providers: https://www.woods-mathews.com/  This test is not yet approved or cleared by the [place] FDA and  has been authorized for detection and/or diagnosis of SARS-CoV-2 by FDA under an Emergency Use Authorization (EUA). This EUA will remain  in effect (meaning this test can be used) for the duration of the COVID-19 declaration under Se ction 564(b)(1) of the Act, 21 U.S.C. section 360bbb-3(b)(1), unless the authorization is terminated or revoked sooner.  Performed at [facility], [place] [address] [place], [place] [address]     Procedures/Studies: CT HEAD WO CONTRAST (5MM)  Result Date: 03/12/2021 CLINICAL DATA:  Head trauma. EXAM: CT HEAD WITHOUT CONTRAST TECHNIQUE: Contiguous axial images were obtained from the base of the skull through the vertex without intravenous contrast. COMPARISON:  CT dated 09/18/2020. FINDINGS: Brain: Moderate age-related atrophy and chronic microvascular ischemic changes. There is no acute hemorrhage. No mass effect or midline shift. No extra-axial fluid collection. Vascular: No hyperdense vessel or unexpected calcification. Skull: Normal. Negative for fracture or focal lesion. Sinuses/Orbits: No acute finding. Other: None IMPRESSION: 1. No acute intracranial pathology. 2. Moderate age-related atrophy and chronic microvascular ischemic changes. Electronically Signed   By: Anner Crete M.D.   On: 03/12/2021 21:59   CT Cervical Spine Wo Contrast  Result Date: 03/12/2021 CLINICAL DATA:  Neck trauma, intoxicated or obtunded (Age >= 16y) ams, fall EXAM: CT CERVICAL SPINE WITHOUT CONTRAST TECHNIQUE: Multidetector CT imaging of the cervical spine was performed without intravenous contrast. Multiplanar CT image reconstructions were also generated.  COMPARISON:  None. FINDINGS: Alignment: 2 mm anterolisthesis of  C2 on C3 and 2 mm retrolisthesis of C5 on C6, likely degenerative and similar to prior exam. No traumatic subluxation. Skull base and vertebrae: No acute fracture. Vertebral body heights are maintained. The dens and skull base are intact. Soft tissues and spinal canal: No prevertebral fluid or swelling. No visible canal hematoma. Disc levels: Disc space narrowing and endplate spurring at N1-Z0. There is multilevel facet hypertrophy. Bony ankylosis of C2-C3 facets may be degenerative or congenital. Upper chest: Biapical pleuroparenchymal scarring. Patulous upper esophagus. Other: Carotid calcifications. IMPRESSION: Degenerative change in the cervical spine without acute fracture or subluxation. Electronically Signed   By: Keith Rake M.D.   On: 03/12/2021 22:04   DG Pelvis Portable  Result Date: 03/12/2021 CLINICAL DATA:  Fall, left hip bruising. EXAM: PORTABLE PELVIS 1-2 VIEWS COMPARISON:  Pelvis and left hip radiograph 03/10/2021 FINDINGS: Cortical irregularity is again seen involving the left greater trochanter, more suspicious for fracture on the current exam than on prior. No other fracture. Femoral heads are well seated. Pubic rami are intact. Pubic symphysis and sacroiliac joints are congruent. The bones are diffusely under mineralized. IMPRESSION: Cortical irregularity of the left greater trochanter. This was seen on prior exam, however currently is more suspicious for fracture than on previous. Electronically Signed   By: Keith Rake M.D.   On: 03/12/2021 21:23   CT Hip Left Wo Contrast  Result Date: 03/12/2021 CLINICAL DATA:  Pain, fall. EXAM: CT OF THE LEFT HIP WITHOUT CONTRAST TECHNIQUE: Multidetector CT imaging of the left hip was performed according to the standard protocol. Multiplanar CT image reconstructions were also generated. COMPARISON:  Radiograph earlier today. FINDINGS: Bones/Joint/Cartilage Acute fracture  through the left greater trochanter is mildly displaced and comminuted. There is no extension to the intertrochanteric femur or femoral neck. Femoral head is well seated. No significant hip joint effusion. Intact pubic rami and acetabulum. Bone island in the left pubic body. Ligaments Suboptimally assessed by CT. Muscles and Tendons No intramuscular hematoma. Soft tissues Soft tissue edema is seen lateral to the left proximal femur. There is wall thickening of the sigmoid colon and rectum with mild pericolonic edema. Air-fluid level in the urinary bladder without bladder wall thickening. IMPRESSION: 1. Acute mildly displaced and comminuted fracture through the left greater trochanter. No extension to the intertrochanteric femur or femoral neck. 2. Air-fluid level in the urinary bladder without bladder wall thickening, likely due to recent instrumentation. 3. Wall thickening of the sigmoid colon and rectum with mild pericolonic edema, suspicious for colitis/proctitis. Recommend correlation for any GI symptoms. Electronically Signed   By: Keith Rake M.D.   On: 03/12/2021 23:22   DG Chest Portable 1 View  Result Date: 03/12/2021 CLINICAL DATA:  Post fall.  Altered mental status. EXAM: PORTABLE CHEST 1 VIEW COMPARISON:  Radiograph 01/23/2021. FINDINGS: Left-sided pacemaker in place. The heart is normal in size. Stable mediastinal contours with aortic atherosclerosis. Biapical pleuroparenchymal scarring. No acute airspace disease. No pneumothorax, pulmonary edema, or pleural effusion. Skin fold projects over the left hemithorax. The bones are under mineralized. No acute osseous abnormalities are seen. IMPRESSION: No acute abnormality. Electronically Signed   By: Keith Rake M.D.   On: 03/12/2021 21:18   DG Knee Complete 4 Views Left  Result Date: 03/10/2021 CLINICAL DATA:  Status post fall. EXAM: LEFT KNEE - COMPLETE 4+ VIEW COMPARISON:  None. FINDINGS: No evidence of fracture, dislocation, or joint  effusion. No evidence of severe arthropathy or other focal bone abnormality. Soft tissues are unremarkable. IMPRESSION:  Negative. Electronically Signed   By: Iven Finn M.D.   On: 03/10/2021 15:48   EEG adult  Result Date: 03/13/2021 Lora Havens, MD     03/13/2021 12:18 PM Patient Name: Sayra Frisby MRN: [id number] Epilepsy Attending: Lora Havens Referring Physician/Provider: Dr Derrick Ravel Date: 03/13/2021 Duration: 22.45 mins Patient history: 80yo F with ams and syncope. EEG to evaluate for seizure. Level of alertness: Awake,asleep AEDs during EEG study: None Technical aspects: This EEG study was done with scalp electrodes positioned according to the 10-20 International system of electrode placement. Electrical activity was acquired at a sampling rate of 500Hz  and reviewed with a high frequency filter of 70Hz  and a low frequency filter of 1Hz . EEG data were recorded continuously and digitally stored. Description: The posterior dominant rhythm consists of 8-9 Hz activity of moderate voltage (25-35 uV) seen predominantly in posterior head regions, symmetric and reactive to eye opening and eye closing. Sleep was characterized by vertex waves, sleep spindles (12 to 14 Hz), maximal frontocentral region. Hyperventilation and photic stimulation were not performed.   IMPRESSION: This study is within normal limits. No seizures or epileptiform discharges were seen throughout the recording. Lora Havens   ECHOCARDIOGRAM COMPLETE  Result Date: 03/13/2021    ECHOCARDIOGRAM REPORT   Patient Name:   ALANYS GODINO Date of Exam: 03/13/2021 Medical Rec #:  [id number]            Height:       66.0 in Accession #:    [id number]           Weight:       100.0 lb Date of Birth:  14-Dec-1940            BSA:          1.490 m Patient Age:    56 years             BP:           140/81 mmHg Patient Gender: F                    HR:           77 bpm. Exam Location:  Inpatient Procedure: 2D Echo,  Cardiac Doppler and Color Doppler Indications:    R55 Syncope  History:        Patient has prior history of Echocardiogram examinations, most                 recent 09/14/2020. Abnormal ECG, COPD, Arrythmias:Atrial                 Fibrillation, Signs/Symptoms:Chest Pain; Risk                 Factors:Hypertension and Dyslipidemia.  Sonographer:    Roseanna Rainbow RDCS Referring Phys: [id number] [facility]  Sonographer Comments: Technically difficult study due to poor echo windows. Rushed for productivity and called back to OR 17. IMPRESSIONS  1. Left ventricular ejection fraction, by estimation, is 60 to 65%. The left ventricle has normal function. The left ventricle has no regional wall motion abnormalities. Left ventricular diastolic parameters are indeterminate.  2. Right ventricular systolic function is normal. The right ventricular size is normal. There is normal pulmonary artery systolic pressure.  3. The mitral valve is normal in structure. Trivial mitral valve regurgitation. No evidence of mitral stenosis.  4. The aortic valve is calcified. There is moderate calcification of the aortic valve. There is moderate thickening of  the aortic valve. Aortic valve regurgitation is mild. No aortic stenosis is present. Aortic valve area, by VTI measures 1.46 cm. Aortic valve mean gradient measures 9.0 mmHg. Aortic valve Vmax measures 2.06 m/s.  5. The inferior vena cava is normal in size with greater than 50% respiratory variability, suggesting right atrial pressure of 3 mmHg. FINDINGS  Left Ventricle: Left ventricular ejection fraction, by estimation, is 60 to 65%. The left ventricle has normal function. The left ventricle has no regional wall motion abnormalities. The left ventricular internal cavity size was normal in size. There is  no left ventricular hypertrophy. Left ventricular diastolic parameters are indeterminate. Normal left ventricular filling pressure. Right Ventricle: The right ventricular size is normal. No  increase in right ventricular wall thickness. Right ventricular systolic function is normal. There is normal pulmonary artery systolic pressure. The tricuspid regurgitant velocity is 2.51 m/s, and  with an assumed right atrial pressure of 8 mmHg, the estimated right ventricular systolic pressure is 40.8 mmHg. Left Atrium: Left atrial size was normal in size. Right Atrium: Right atrial size was normal in size. Pericardium: There is no evidence of pericardial effusion. Mitral Valve: The mitral valve is normal in structure. Trivial mitral valve regurgitation. No evidence of mitral valve stenosis. Tricuspid Valve: The tricuspid valve is normal in structure. Tricuspid valve regurgitation is mild . No evidence of tricuspid stenosis. Aortic Valve: The aortic valve is calcified. There is moderate calcification of the aortic valve. There is moderate thickening of the aortic valve. Aortic valve regurgitation is mild. No aortic stenosis is present. Aortic valve mean gradient measures 9.0  mmHg. Aortic valve peak gradient measures 17.0 mmHg. Aortic valve area, by VTI measures 1.46 cm. Pulmonic Valve: The pulmonic valve was normal in structure. Pulmonic valve regurgitation is not visualized. No evidence of pulmonic stenosis. Aorta: The aortic root is normal in size and structure. Venous: The inferior vena cava is normal in size with greater than 50% respiratory variability, suggesting right atrial pressure of 3 mmHg. IAS/Shunts: No atrial level shunt detected by color flow Doppler.  LEFT VENTRICLE PLAX 2D LVIDd:         3.60 cm     Diastology LVIDs:         2.00 cm     LV e' medial:    4.68 cm/s LV PW:         0.90 cm     LV E/e' medial:  15.2 LV IVS:        0.90 cm     LV e' lateral:   7.40 cm/s LVOT diam:     1.80 cm     LV E/e' lateral: 9.6 LV SV:         58 LV SV Index:   39 LVOT Area:     2.54 cm  LV Volumes (MOD) LV vol d, MOD A2C: 40.4 ml LV vol d, MOD A4C: 38.2 ml LV vol s, MOD A2C: 15.9 ml LV vol s, MOD A4C: 11.6 ml  LV SV MOD A2C:     24.5 ml LV SV MOD A4C:     38.2 ml LV SV MOD BP:      27.2 ml RIGHT VENTRICLE            IVC RV S prime:     9.68 cm/s  IVC diam: 0.90 cm TAPSE (M-mode): 1.4 cm LEFT ATRIUM           Index       RIGHT ATRIUM  Index LA diam:      2.70 cm 1.81 cm/m  RA Area:     8.31 cm LA Vol (A2C): 15.1 ml 10.14 ml/m RA Volume:   18.30 ml 12.29 ml/m LA Vol (A4C): 20.6 ml 13.83 ml/m  AORTIC VALVE AV Area (Vmax):    1.41 cm AV Area (Vmean):   1.39 cm AV Area (VTI):     1.46 cm AV Vmax:           206.00 cm/s AV Vmean:          136.000 cm/s AV VTI:            0.396 m AV Peak Grad:      17.0 mmHg AV Mean Grad:      9.0 mmHg LVOT Vmax:         114.00 cm/s LVOT Vmean:        74.100 cm/s LVOT VTI:          0.228 m LVOT/AV VTI ratio: 0.58  AORTA Ao Root diam: 2.50 cm Ao Asc diam:  3.20 cm MITRAL VALVE               TRICUSPID VALVE MV Area (PHT): 3.08 cm    TR Peak grad:   25.2 mmHg MV Decel Time: 246 msec    TR Vmax:        251.00 cm/s MV E velocity: 71.10 cm/s MV A velocity: 69.40 cm/s  SHUNTS MV E/A ratio:  1.02        Systemic VTI:  0.23 m                            Systemic Diam: 1.80 cm Fransico Him MD Electronically signed by Fransico Him MD Signature Date/Time: 03/13/2021/1:47:53 PM    Final    CUP PACEART REMOTE DEVICE CHECK  Result Date: 03/06/2021 Scheduled remote reviewed. Normal device function.  No events since 8/7, previously documented. Next remote 91 days. LR  DG Hip Unilat With Pelvis 2-3 Views Left  Result Date: 03/10/2021 CLINICAL DATA:  Fall yesterday.  Contusion.  On Eliquis. EXAM: DG HIP (WITH OR WITHOUT PELVIS) 2-3V LEFT COMPARISON:  None. FINDINGS: Markedly limited evaluation due to overlapping osseous structures and overlying soft tissues. There is no evidence of hip fracture or dislocation of the left hip. Frontal view of the right hip with no acute displaced fracture or dislocation. No acute displaced fracture or diastasis of the bones of the pelvis. Degenerative changes  of the visualized lower lumbar spine. Limited evaluation to evaluate the sacrum due to overlying bowel gas. There is no evidence of severe arthropathy or other focal bone abnormality. IMPRESSION: Negative. Electronically Signed   By: Iven Finn M.D.   On: 03/10/2021 15:47   DG FEMUR MIN 2 VIEWS LEFT  Result Date: 03/10/2021 CLINICAL DATA:  Fall striking left hip yesterday. EXAM: LEFT FEMUR 2 VIEWS COMPARISON:  Hip radiographs from 03/10/2021 FINDINGS: Mild irregularity of the upper margin of the greater trochanter is probably from spurring. This is less likely to be an indicator of underlying fracture, but if the patient is unable to bear weight then CT or MRI would be recommended for further investigation. The femur appears otherwise unremarkable. IMPRESSION: 1. Irregular upper margin of the greater trochanter, probably from chronic spurring, less likely an indicator of subtle fracture. Consider cross-sectional imaging if the patient is unable to bear weight or if otherwise clinically warranted. Electronically Signed   By:  Van Clines M.D.   On: 03/10/2021 17:08   VAS US CAROTID  Result Date: 03/13/2021 Carotid Arterial Duplex Study Patient Name:  ZANDRA LAJEUNESSE  Date of Exam:   03/13/2021 Medical Rec #: [id number]             Accession #:    [id number] Date of Birth: November 24, 1940             Patient Gender: F Patient Age:   89 years Exam Location:  [facility] Procedure:      VAS US CAROTID Referring Phys: Wandra Feinstein RATHORE --------------------------------------------------------------------------------  Indications:       Syncope. Risk Factors:      Hypertension, hyperlipidemia. Comparison Study:  01/12/19 prior Performing Technologist: Archie Patten RVS  Examination Guidelines: A complete evaluation includes B-mode imaging, spectral Doppler, color Doppler, and power Doppler as needed of all accessible portions of each vessel. Bilateral testing is considered an integral part of a  complete examination. Limited examinations for reoccurring indications may be performed as noted.  Right Carotid Findings: +----------+--------+--------+--------+------------------+--------+           PSV cm/sEDV cm/sStenosisPlaque DescriptionComments +----------+--------+--------+--------+------------------+--------+ CCA Prox  73      16              heterogenous               +----------+--------+--------+--------+------------------+--------+ CCA Distal53      19              heterogenous               +----------+--------+--------+--------+------------------+--------+ ICA Prox  54      21      1-39%   heterogenous               +----------+--------+--------+--------+------------------+--------+ ICA Distal116     40                                         +----------+--------+--------+--------+------------------+--------+ ECA       57      11                                         +----------+--------+--------+--------+------------------+--------+ +----------+--------+-------+--------+-------------------+           PSV cm/sEDV cmsDescribeArm Pressure (mmHG) +----------+--------+-------+--------+-------------------+ [id number]                                         +----------+--------+-------+--------+-------------------+ +---------+--------+--+--------+--+---------+ VertebralPSV cm/s45EDV cm/s14Antegrade +---------+--------+--+--------+--+---------+  Left Carotid Findings: +----------+--------+--------+--------+------------------+--------+           PSV cm/sEDV cm/sStenosisPlaque DescriptionComments +----------+--------+--------+--------+------------------+--------+ CCA Prox  55      11              heterogenous               +----------+--------+--------+--------+------------------+--------+ CCA Distal52      15              heterogenous               +----------+--------+--------+--------+------------------+--------+ ICA Prox   48      19      1-39%   heterogenous               +----------+--------+--------+--------+------------------+--------+  ICA Distal67      21                                         +----------+--------+--------+--------+------------------+--------+ ECA       69      8                                          +----------+--------+--------+--------+------------------+--------+ +----------+--------+--------+--------+-------------------+           PSV cm/sEDV cm/sDescribeArm Pressure (mmHG) +----------+--------+--------+--------+-------------------+ [id number]                                          +----------+--------+--------+--------+-------------------+ +---------+--------+--+--------+--+---------+ VertebralPSV cm/s47EDV cm/s14Antegrade +---------+--------+--+--------+--+---------+   Summary: Right Carotid: Velocities in the right ICA are consistent with a 1-39% stenosis. Left Carotid: Velocities in the left ICA are consistent with a 1-39% stenosis. Vertebrals: Bilateral vertebral arteries demonstrate antegrade flow. *See table(s) above for measurements and observations.  Electronically signed by Deitra Mayo MD on 03/13/2021 at 3:58:02 PM.    Final    VAS [place] LOWER EXTREMITY VENOUS (DVT)  Result Date: 03/13/2021  Lower Venous DVT Study Patient Name:  ALEEHA BOLINE  Date of Exam:   03/13/2021 Medical Rec #: [id number]             Accession #:    [id number] Date of Birth: 05/07/1941             Patient Gender: F Patient Age:   78 years Exam Location:  [facility] Procedure:      VAS [place] LOWER EXTREMITY VENOUS (DVT) Referring Phys: Webb --------------------------------------------------------------------------------  Indications: Syncope.  Comparison Study: no prior Performing Technologist: Archie Patten RVS  Examination Guidelines: A complete evaluation includes B-mode imaging, spectral Doppler, color Doppler, and power Doppler as needed of all  accessible portions of each vessel. Bilateral testing is considered an integral part of a complete examination. Limited examinations for reoccurring indications may be performed as noted. The reflux portion of the exam is performed with the patient in reverse Trendelenburg.  +---------+---------------+---------+-----------+----------+--------------+ RIGHT    CompressibilityPhasicitySpontaneityPropertiesThrombus Aging +---------+---------------+---------+-----------+----------+--------------+ CFV      Full           Yes      Yes                                 +---------+---------------+---------+-----------+----------+--------------+ SFJ      Full                                                        +---------+---------------+---------+-----------+----------+--------------+ FV Prox  Full                                                        +---------+---------------+---------+-----------+----------+--------------+ FV Mid   Full                                                        +---------+---------------+---------+-----------+----------+--------------+  FV DistalFull                                                        +---------+---------------+---------+-----------+----------+--------------+ PFV      Full                                                        +---------+---------------+---------+-----------+----------+--------------+ POP      Full           Yes      Yes                                 +---------+---------------+---------+-----------+----------+--------------+ PTV      Full                                                        +---------+---------------+---------+-----------+----------+--------------+ PERO     Full                                                        +---------+---------------+---------+-----------+----------+--------------+   +---------+---------------+---------+-----------+----------+--------------+  LEFT     CompressibilityPhasicitySpontaneityPropertiesThrombus Aging +---------+---------------+---------+-----------+----------+--------------+ CFV      Full           Yes      Yes                                 +---------+---------------+---------+-----------+----------+--------------+ SFJ      Full                                                        +---------+---------------+---------+-----------+----------+--------------+ FV Prox  Full                                                        +---------+---------------+---------+-----------+----------+--------------+ FV Mid   Full                                                        +---------+---------------+---------+-----------+----------+--------------+ FV DistalFull                                                        +---------+---------------+---------+-----------+----------+--------------+  PFV      Full                                                        +---------+---------------+---------+-----------+----------+--------------+ POP      Full           Yes      Yes                                 +---------+---------------+---------+-----------+----------+--------------+ PTV      Full                                                        +---------+---------------+---------+-----------+----------+--------------+ PERO     Full                                                        +---------+---------------+---------+-----------+----------+--------------+     Summary: BILATERAL: - No evidence of deep vein thrombosis seen in the lower extremities, bilaterally. -No evidence of popliteal cyst, bilaterally.   *See table(s) above for measurements and observations. Electronically signed by Deitra Mayo MD on 03/13/2021 at 3:56:56 PM.    Final     Labs: BNP (last 3 results) No results for input(s): BNP in the last 8760 hours. Basic Metabolic Panel: Recent Labs  Lab  03/12/21 2142 03/13/21 0605 03/13/21 1603 03/14/21 0034 03/15/21 0030 03/16/21 0022  NA 135  --  137 138 137 137  K 4.0  --  3.9 3.6 3.7 3.7  CL 104  --  106 107 110 107  CO2 19*  --  20* 20* 20* 22  GLUCOSE 109*  --  98 89 98 90  BUN 39*  --  40* 39* 33* 29*  CREATININE 2.44*  --  2.48* 2.29* 1.90* 1.91*  CALCIUM 9.3  --  8.3* 8.5* 8.3* 8.5*  MG  --  2.1  --   --   --   --    Liver Function Tests: Recent Labs  Lab 03/12/21 2142  AST 26  ALT 13  ALKPHOS 108  BILITOT 1.3*  PROT 6.7  ALBUMIN 2.9*   No results for input(s): LIPASE, AMYLASE in the last 168 hours. Recent Labs  Lab 03/12/21 2144  AMMONIA 14   CBC: Recent Labs  Lab 03/12/21 2142 03/14/21 0034 03/15/21 0030 03/16/21 0022  WBC 7.2 8.2 7.1 7.4  NEUTROABS 5.9  --   --   --   HGB 12.1 11.2* 9.4* 10.1*  HCT 38.6 34.7* 29.6* 30.9*  MCV 95.1 92.3 92.8 91.2  PLT 202 225 221 267   Cardiac Enzymes: Recent Labs  Lab 03/13/21 0605  CKTOTAL 120   BNP: Invalid input(s): POCBNP CBG: No results for input(s): GLUCAP in the last 168 hours. D-Dimer No results for input(s): DDIMER in the last 72 hours. Hgb A1c No results for input(s): HGBA1C in the last 72 hours. Lipid Profile No results for input(s): CHOL, HDL,  LDLCALC, TRIG, CHOLHDL, LDLDIRECT in the last 72 hours. Thyroid function studies No results for input(s): TSH, T4TOTAL, T3FREE, THYROIDAB in the last 72 hours.  Invalid input(s): FREET3 Anemia work up No results for input(s): VITAMINB12, FOLATE, FERRITIN, TIBC, IRON, RETICCTPCT in the last 72 hours. Urinalysis    Component Value Date/Time   COLORURINE YELLOW 03/13/2021 0021   APPEARANCEUR TURBID (A) 03/13/2021 0021   LABSPEC 1.020 03/13/2021 0021   PHURINE 6.0 03/13/2021 0021   GLUCOSEU NEGATIVE 03/13/2021 0021   GLUCOSEU NEGATIVE 11/21/2015 1156   HGBUR LARGE (A) 03/13/2021 0021   BILIRUBINUR NEGATIVE 03/13/2021 0021   BILIRUBINUR Neg 09/07/2012 1426   KETONESUR NEGATIVE 03/13/2021 0021    PROTEINUR 30 (A) 03/13/2021 0021   UROBILINOGEN 0.2 11/21/2015 1156   NITRITE NEGATIVE 03/13/2021 0021   LEUKOCYTESUR LARGE (A) 03/13/2021 0021   Sepsis Labs Invalid input(s): PROCALCITONIN,  WBC,  LACTICIDVEN Microbiology Recent Results (from the past 240 hour(s))  Resp Panel by RT-PCR (Flu A&B, Covid) Nasopharyngeal Swab     Status: None   Collection Time: 03/12/21  8:51 PM   Specimen: Nasopharyngeal Swab; Nasopharyngeal(NP) swabs in vial transport medium  Result Value Ref Range Status   SARS Coronavirus 2 by RT PCR NEGATIVE NEGATIVE Final    Comment: (NOTE) SARS-CoV-2 target nucleic acids are NOT DETECTED.  The SARS-CoV-2 RNA is generally detectable in upper respiratory specimens during the acute phase of infection. The lowest concentration of SARS-CoV-2 viral copies this assay can detect is 138 copies/mL. A negative result does not preclude SARS-Cov-2 infection and should not be used as the sole basis for treatment or other patient management decisions. A negative result may occur with  improper specimen collection/handling, submission of specimen other than nasopharyngeal swab, presence of viral mutation(s) within the areas targeted by this assay, and inadequate number of viral copies(<138 copies/mL). A negative result must be combined with clinical observations, patient history, and epidemiological information. The expected result is Negative.  Fact Sheet for Patients:  EntrepreneurPulse.com.au  Fact Sheet for Healthcare Providers:  IncredibleEmployment.be  This test is no t yet approved or cleared by the [place] FDA and  has been authorized for detection and/or diagnosis of SARS-CoV-2 by FDA under an Emergency Use Authorization (EUA). This EUA will remain  in effect (meaning this test can be used) for the duration of the COVID-19 declaration under Section 564(b)(1) of the Act, 21 U.S.C.section 360bbb-3(b)(1), unless the  authorization is terminated  or revoked sooner.       Influenza A by PCR NEGATIVE NEGATIVE Final   Influenza B by PCR NEGATIVE NEGATIVE Final    Comment: (NOTE) The Xpert Xpress SARS-CoV-2/FLU/RSV plus assay is intended as an aid in the diagnosis of influenza from Nasopharyngeal swab specimens and should not be used as a sole basis for treatment. Nasal washings and aspirates are unacceptable for Xpert Xpress SARS-CoV-2/FLU/RSV testing.  Fact Sheet for Patients: EntrepreneurPulse.com.au  Fact Sheet for Healthcare Providers: IncredibleEmployment.be  This test is not yet approved or cleared by the [place] FDA and has been authorized for detection and/or diagnosis of SARS-CoV-2 by FDA under an Emergency Use Authorization (EUA). This EUA will remain in effect (meaning this test can be used) for the duration of the COVID-19 declaration under Section 564(b)(1) of the Act, 21 U.S.C. section 360bbb-3(b)(1), unless the authorization is terminated or revoked.  Performed at [facility], [place] [address] [place], [place] [address]   Urine Culture     Status: Abnormal   Collection  Time: 03/13/21 12:21 AM   Specimen: In/Out Cath Urine  Result Value Ref Range Status   Specimen Description IN/OUT CATH URINE  Final   Special Requests   Final    NONE Performed at [facility], [address], [place], [place] [address]    Culture >=100,000 COLONIES/mL ESCHERICHIA COLI (A)  Final   Report Status 03/15/2021 FINAL  Final   Organism ID, Bacteria ESCHERICHIA COLI (A)  Final      Susceptibility   Escherichia coli - MIC*    AMPICILLIN >=32 RESISTANT Resistant     CEFAZOLIN 16 SENSITIVE Sensitive     CEFEPIME <=0.12 SENSITIVE Sensitive     CEFTRIAXONE <=0.25 SENSITIVE Sensitive     CIPROFLOXACIN <=0.25 SENSITIVE Sensitive     GENTAMICIN <=1 SENSITIVE Sensitive     IMIPENEM <=0.25 SENSITIVE Sensitive     NITROFURANTOIN <=16 SENSITIVE  Sensitive     TRIMETH/SULFA <=20 SENSITIVE Sensitive     AMPICILLIN/SULBACTAM >=32 RESISTANT Resistant     PIP/TAZO <=4 SENSITIVE Sensitive     * >=100,000 COLONIES/mL ESCHERICHIA COLI  SARS CORONAVIRUS 2 (TAT 6-24 HRS) Nasopharyngeal Nasopharyngeal Swab     Status: None   Collection Time: 03/18/21 10:10 AM   Specimen: Nasopharyngeal Swab  Result Value Ref Range Status   SARS Coronavirus 2 NEGATIVE NEGATIVE Final    Comment: (NOTE) SARS-CoV-2 target nucleic acids are NOT DETECTED.  The SARS-CoV-2 RNA is generally detectable in upper and lower respiratory specimens during the acute phase of infection. Negative results do not preclude SARS-CoV-2 infection, do not rule out co-infections with other pathogens, and should not be used as the sole basis for treatment or other patient management decisions. Negative results must be combined with clinical observations, patient history, and epidemiological information. The expected result is Negative.  Fact Sheet for Patients: SugarRoll.be  Fact Sheet for Healthcare Providers: https://www.woods-mathews.com/  This test is not yet approved or cleared by the [place] FDA and  has been authorized for detection and/or diagnosis of SARS-CoV-2 by FDA under an Emergency Use Authorization (EUA). This EUA will remain  in effect (meaning this test can be used) for the duration of the COVID-19 declaration under Se ction 564(b)(1) of the Act, 21 U.S.C. section 360bbb-3(b)(1), unless the authorization is terminated or revoked sooner.  Performed at [facility], [place] [address] [place], [place] [address]      Time coordinating discharge: 35 minutes  SIGNED: Antonieta Pert, MD  [facility] 03/19/2021, 8:47 AM  If 7PM-7AM, please contact night-coverage [email or web address]

## 2021-03-17 NOTE — TOC Progression Note (Signed)
Transition of Care [facility]) - Progression Note    Patient Details  Name: Deborah Shields MRN: [id number] Date of Birth: 02-23-1941  Transition of Care [facility]) CM/SW Eleanor, LCSW Phone Number:[phone] 03/17/2021, 10:14 AM  Clinical Narrative:     CSW attempted to follow up with Deborah Shields at [facility] and had to leave VM.  CSW spoke with daughter Deborah Shields in regards to pt's DC planning. CSW asked about pt's vaccination's dates and she stated that her husband would be bringing them shortly. CSW inquired about [facility] however she stated that she had not made a decision yet. CSW informed Deborah Shields that CSW would come to room and speak with her about the current bed offers.  TOC team will continue to assist with discharge planning needs.           Expected Discharge Plan and Services                                                 Social Determinants of Health (SDOH) Interventions    Readmission Risk Interventions No flowsheet data found.

## 2021-03-18 DIAGNOSIS — S72115A Nondisplaced fracture of greater trochanter of left femur, initial encounter for closed fracture: Secondary | ICD-10-CM | POA: Diagnosis not present

## 2021-03-18 LAB — SARS CORONAVIRUS 2 (TAT 6-24 HRS): SARS Coronavirus 2: NEGATIVE

## 2021-03-18 MED ORDER — SENNOSIDES-DOCUSATE SODIUM 8.6-50 MG PO TABS
1.0000 | ORAL_TABLET | Freq: Every day | ORAL | Status: DC | PRN
  Administered 2021-03-18: 1 via ORAL
  Filled 2021-03-18: qty 1

## 2021-03-18 NOTE — Progress Notes (Signed)
Pt states she has flashes of a headache now and again, but no prolonged headache.medicated.

## 2021-03-18 NOTE — Progress Notes (Signed)
PROGRESS NOTE    Deborah Shields  DDU:[id number] DOB: 1941-04-16 DOA: 03/12/2021 PCP: Lajean Manes, MD   Brief Narrative/Hospital Course: 80 year old female with PAF on Eliquis, tachybradycardia syndrome post PPM, CKD 3B, HLD, HTN, hypothyroidism, PVD, history of PE, carotid stenosis, anemia was found on the floor confused hallucinating and crawling by the daughter and brought to the ED for evaluation.  Patient was last normal at her baseline all day earlier in the evening 9/18.   In the ED found to have AKI creatinine up fom 1.9-2.4 previously 1.4 baseline. UA WBC more than 50 suspecting UTI urine culture sent, chest x-ray no acute finding CT head and C-spine negative for acute finding, CT of the left hip acute mildly displaced and comminuted fracture through the left greater trochanter, also with thickening of the sigmoid colon and rectum with mild pericolonic edema suspicious for colitis/proctitis, orthopedic was consulted IV fluid was given, placed on antibiotic and admitted for further management of UTI, left greater Trochanteric fracture, sigmoid colitis/proctitis, AKI Treated with antibiotics at this time says clinically improved seen by orthopedic nonoperative management advised, seen by PT OT and planning for skilled nursing facility then outpatient follow-up with orthopedics   Subjective: Seen this am daughter at bedside Complains of constipation. No fever overnight BP stable  Assessment & Plan: E Coli UTI POA: changed to keflex. Complete course.   Proctitis/colitis: Noted on the CT abdomen.  Diet as tolerated, continue keflex/Flagylx 7 days.   Left greater trochanteric fracture seen by orthopedic, plan is for nonoperative management. PT OT eval, completed WBAT , planning for skilled nursing facility.  FU with Dr. Ninfa Linden in 2 weeks   AKI on CKD 3b Mild anion gap metabolic acidosis-resolved: Baseline creatinine 1.5 but recently 1.9 on admission 2.4.  Improved to  1.9. Tolerating p.o. Recent Labs  Lab 03/12/21 2142 03/13/21 1603 03/14/21 0034 03/15/21 0030 03/16/21 0022  BUN 39* 40* 39* 33* 29*  CREATININE 2.44* 2.48* 2.29* 1.90* 1.91*    Acute metabolic encephalopathy MCI- memory issues with confusion-likely dementia Multiple falls rule out syncope: Acute metabolic encephalopathy on admission- no resolved close to baseline with baseline confusion from dementia.Syncope work-up initiated on admission but no witnessed syncope was found on the floor confused.  CK normal, 2D echo normal EF, EEG no seizure.  Work up unrevealing with CT head, B12, ammonia, alcohol level.  Had D-dimer done work-up of  syncope and was elevated but no respiratory symptoms or chest pain or leg edema-do not suspect VTE- and she is already anticoagulated. Duplex leg neg. continue PT OT   PAF rate controlled on Eliquis metoprolol and flecainide Tachybradycardia syndrome with pacemaker in place QT prolongation monitor Goal of care DNR , discussed with daughter at the bedside monitor closely elderly at risk of decompensation requires admission. PMT consulted-appreciate input. Severe protein calorie malnutrition:RD following plan as below with diet augmentation Nutrition Problem: Severe Malnutrition Etiology: chronic illness (CKD) Signs/Symptoms: severe muscle depletion, severe fat depletion Interventions: Ensure Enlive (each supplement provides 350kcal and 20 grams of protein)  Mild constipation continue senna  Pressure Ulcer: Pressure Injury 03/13/21 Coccyx Medial Stage 2 -  Partial thickness loss of dermis presenting as a shallow open injury with a red, pink wound bed without slough. (Active)  03/13/21 1913  Location: Coccyx  Location Orientation: Medial  Staging: Stage 2 -  Partial thickness loss of dermis presenting as a shallow open injury with a red, pink wound bed without slough.  Wound Description (Comments):   Present on  Admission: Yes   Diet Order              Diet regular Room service appropriate? Yes; Fluid consistency: Thin  Diet effective now                   Nutrition Problem: Severe Malnutrition Etiology: chronic illness (CKD) Signs/Symptoms: severe muscle depletion, severe fat depletion Interventions: Ensure Enlive (each supplement provides 350kcal and 20 grams of protein) DVT prophylaxis: apixaban (ELIQUIS) tablet 2.5 mg Start: 03/13/21 1145 Code Status:   Code Status: DNR  Family Communication: plan of care discussed with patient at bedside. Status is: Inpatient Remains inpatient appropriate because:Unsafe d/c plan  Dispo: The patient is from: Home              Anticipated d/c is to: SNF              Patient currently is medically stable to d/c.   Difficult to place patient No       Objective: Vitals: Today's Vitals   03/18/21 0300 03/18/21 0403 03/18/21 0500 03/18/21 0600  BP: (!) 155/86 (!) 152/106 (!) 160/89 (!) 144/86  Pulse: 63 63 64 63  Resp: 11 15 13 16   Temp:  97.6 F (36.4 C)    TempSrc:  Oral    SpO2: 96% 95% 95% 96%  PainSc:       Examination: General exam: Aaox2-3 with baseline confusion, weak,older than stated age HEENT:Oral mucosa moist, Ear/Nose WNL grossly,dentition normal. Respiratory system: bilaterally lear, no use of accessory muscle, non tender. Cardiovascular system: S1 & S2 +,no JVD. Gastrointestinal system: Abdomen soft, NT,ND, BS+. Nervous System:Alert, awake, moving extremities Extremities: No edema, distal peripheral pulses palpable.  Skin: No rashes,no icterus. MSK: Normal muscle bulk,tone, power   Intake/Output Summary (Last 24 hours) at 03/18/2021 0710 Last data filed at 03/18/2021 0407 Gross per 24 hour  Intake 240 ml  Output 1250 ml  Net -1010 ml   There were no vitals filed for this visit. Weight change:    Consultants:see note  Procedures:see note Antimicrobials: Anti-infectives (From admission, onward)    Start     Dose/Rate Route Frequency Ordered Stop    03/17/21 1000  cephALEXin (KEFLEX) capsule 500 mg        500 mg Oral Every 12 hours 03/16/21 0736 03/20/21 0959   03/17/21 0000  metroNIDAZOLE (FLAGYL) 500 MG tablet        500 mg Oral Every 8 hours 03/17/21 0913 03/20/21 2359   03/17/21 0000  cephALEXin (KEFLEX) 500 MG capsule        500 mg Oral Every 12 hours 03/17/21 0913 03/19/21 2359   03/16/21 1400  metroNIDAZOLE (FLAGYL) tablet 500 mg        500 mg Oral Every 8 hours 03/16/21 0736 03/19/21 1359   03/13/21 0615  cefTRIAXone (ROCEPHIN) 1 g in sodium chloride 0.9 % 100 mL IVPB  Status:  Discontinued        1 g 200 mL/hr over 30 Minutes Intravenous Daily 03/13/21 0608 03/16/21 0736   03/13/21 0615  metroNIDAZOLE (FLAGYL) IVPB 500 mg  Status:  Discontinued        500 mg 100 mL/hr over 60 Minutes Intravenous Every 12 hours 03/13/21 0608 03/16/21 0736      Culture/Microbiology    Component Value Date/Time   SDES IN/OUT CATH URINE 03/13/2021 0021   SPECREQUEST  03/13/2021 0021    NONE Performed at [facility], [place] [address] [place], [place] [address]  CULT >=100,000 COLONIES/mL ESCHERICHIA COLI (A) 03/13/2021 0021   REPTSTATUS 03/15/2021 FINAL 03/13/2021 0021    Other culture-see note  Unresulted Labs (From admission, onward)     Start     Ordered   03/18/21 1000  SARS CORONAVIRUS 2 (TAT 6-24 HRS) Nasopharyngeal Nasopharyngeal Swab  Tomorrow morning,   R       Question Answer Comment  Is this test for diagnosis or screening Screening   Symptomatic for COVID-19 as defined by CDC No   Hospitalized for COVID-19 No   Admitted to ICU for COVID-19 No   Previously tested for COVID-19 Yes   Resident in a congregate (group) care setting Yes   Employed in healthcare setting No   Pregnant No   Has patient completed COVID vaccination(s) (2 doses of Pfizer/Moderna 1 dose of [employer]) Yes   Has patient completed COVID Booster / 3rd dose Unknown      03/17/21 1338           Medications reviewed:  Scheduled  Meds:  apixaban  2.5 mg Oral BID   B-complex with vitamin C  1 tablet Oral QPC supper   cephALEXin  500 mg Oral Q12H   cholecalciferol  1,000 Units Oral Daily   feeding supplement  237 mL Oral BID BM   flecainide  75 mg Oral BID   levothyroxine  88 mcg Oral QAC breakfast   metoprolol succinate  100 mg Oral Daily   metroNIDAZOLE  500 mg Oral Q8H   potassium chloride  20 mEq Oral Daily   senna  1 tablet Oral QODAY   Continuous Infusions:   Intake/Output from previous day: 09/24 0701 - 09/25 0700 In: 240 [P.O.:240] Out: 1250 [Urine:1250] Intake/Output this shift: No intake/output data recorded. There were no vitals filed for this visit. Data Reviewed: I have personally reviewed following labs and imaging studies CBC: Recent Labs  Lab 03/12/21 2142 03/14/21 0034 03/15/21 0030 03/16/21 0022  WBC 7.2 8.2 7.1 7.4  NEUTROABS 5.9  --   --   --   HGB 12.1 11.2* 9.4* 10.1*  HCT 38.6 34.7* 29.6* 30.9*  MCV 95.1 92.3 92.8 91.2  PLT 202 225 221 476   Basic Metabolic Panel: Recent Labs  Lab 03/12/21 2142 03/13/21 0605 03/13/21 1603 03/14/21 0034 03/15/21 0030 03/16/21 0022  NA 135  --  137 138 137 137  K 4.0  --  3.9 3.6 3.7 3.7  CL 104  --  106 107 110 107  CO2 19*  --  20* 20* 20* 22  GLUCOSE 109*  --  98 89 98 90  BUN 39*  --  40* 39* 33* 29*  CREATININE 2.44*  --  2.48* 2.29* 1.90* 1.91*  CALCIUM 9.3  --  8.3* 8.5* 8.3* 8.5*  MG  --  2.1  --   --   --   --    GFR: CrCl cannot be calculated (Unknown ideal weight.). Liver Function Tests: Recent Labs  Lab 03/12/21 2142  AST 26  ALT 13  ALKPHOS 108  BILITOT 1.3*  PROT 6.7  ALBUMIN 2.9*   No results for input(s): LIPASE, AMYLASE in the last 168 hours. Recent Labs  Lab 03/12/21 2144  AMMONIA 14   Coagulation Profile: Recent Labs  Lab 03/12/21 2142  INR 1.5*   Cardiac Enzymes: Recent Labs  Lab 03/13/21 0605  CKTOTAL 120   BNP (last 3 results) No results for input(s): PROBNP in the last 8760  hours. HbA1C: No  results for input(s): HGBA1C in the last 72 hours. CBG: No results for input(s): GLUCAP in the last 168 hours. Lipid Profile: No results for input(s): CHOL, HDL, LDLCALC, TRIG, CHOLHDL, LDLDIRECT in the last 72 hours. Thyroid Function Tests: No results for input(s): TSH, T4TOTAL, FREET4, T3FREE, THYROIDAB in the last 72 hours. Anemia Panel: No results for input(s): VITAMINB12, FOLATE, FERRITIN, TIBC, IRON, RETICCTPCT in the last 72 hours. Sepsis Labs: No results for input(s): PROCALCITON, LATICACIDVEN in the last 168 hours.  Recent Results (from the past 240 hour(s))  Resp Panel by RT-PCR (Flu A&B, Covid) Nasopharyngeal Swab     Status: None   Collection Time: 03/12/21  8:51 PM   Specimen: Nasopharyngeal Swab; Nasopharyngeal(NP) swabs in vial transport medium  Result Value Ref Range Status   SARS Coronavirus 2 by RT PCR NEGATIVE NEGATIVE Final    Comment: (NOTE) SARS-CoV-2 target nucleic acids are NOT DETECTED.  The SARS-CoV-2 RNA is generally detectable in upper respiratory specimens during the acute phase of infection. The lowest concentration of SARS-CoV-2 viral copies this assay can detect is 138 copies/mL. A negative result does not preclude SARS-Cov-2 infection and should not be used as the sole basis for treatment or other patient management decisions. A negative result may occur with  improper specimen collection/handling, submission of specimen other than nasopharyngeal swab, presence of viral mutation(s) within the areas targeted by this assay, and inadequate number of viral copies(<138 copies/mL). A negative result must be combined with clinical observations, patient history, and epidemiological information. The expected result is Negative.  Fact Sheet for Patients:  EntrepreneurPulse.com.au  Fact Sheet for Healthcare Providers:  IncredibleEmployment.be  This test is no t yet approved or cleared by the [place] FDA and  has been authorized for detection and/or diagnosis of SARS-CoV-2 by FDA under an Emergency Use Authorization (EUA). This EUA will remain  in effect (meaning this test can be used) for the duration of the COVID-19 declaration under Section 564(b)(1) of the Act, 21 U.S.C.section 360bbb-3(b)(1), unless the authorization is terminated  or revoked sooner.       Influenza A by PCR NEGATIVE NEGATIVE Final   Influenza B by PCR NEGATIVE NEGATIVE Final    Comment: (NOTE) The Xpert Xpress SARS-CoV-2/FLU/RSV plus assay is intended as an aid in the diagnosis of influenza from Nasopharyngeal swab specimens and should not be used as a sole basis for treatment. Nasal washings and aspirates are unacceptable for Xpert Xpress SARS-CoV-2/FLU/RSV testing.  Fact Sheet for Patients: EntrepreneurPulse.com.au  Fact Sheet for Healthcare Providers: IncredibleEmployment.be  This test is not yet approved or cleared by the [place] FDA and has been authorized for detection and/or diagnosis of SARS-CoV-2 by FDA under an Emergency Use Authorization (EUA). This EUA will remain in effect (meaning this test can be used) for the duration of the COVID-19 declaration under Section 564(b)(1) of the Act, 21 U.S.C. section 360bbb-3(b)(1), unless the authorization is terminated or revoked.  Performed at [facility], [place] [address], [place], [place] [address]   Urine Culture     Status: Abnormal   Collection Time: 03/13/21 12:21 AM   Specimen: In/Out Cath Urine  Result Value Ref Range Status   Specimen Description IN/OUT CATH URINE  Final   Special Requests   Final    NONE Performed at [facility], [place] [address], [place], [place] [address]    Culture >=100,000 COLONIES/mL ESCHERICHIA COLI (A)  Final   Report Status 03/15/2021 FINAL  Final  Organism ID, Bacteria ESCHERICHIA COLI (A)  Final      Susceptibility   Escherichia coli - MIC*     AMPICILLIN >=32 RESISTANT Resistant     CEFAZOLIN 16 SENSITIVE Sensitive     CEFEPIME <=0.12 SENSITIVE Sensitive     CEFTRIAXONE <=0.25 SENSITIVE Sensitive     CIPROFLOXACIN <=0.25 SENSITIVE Sensitive     GENTAMICIN <=1 SENSITIVE Sensitive     IMIPENEM <=0.25 SENSITIVE Sensitive     NITROFURANTOIN <=16 SENSITIVE Sensitive     TRIMETH/SULFA <=20 SENSITIVE Sensitive     AMPICILLIN/SULBACTAM >=32 RESISTANT Resistant     PIP/TAZO <=4 SENSITIVE Sensitive     * >=100,000 COLONIES/mL ESCHERICHIA COLI     Radiology Studies: No results found.   LOS: 6 days   Antonieta Pert, MD [facility]  03/18/2021, 7:10 AM

## 2021-03-18 NOTE — Plan of Care (Signed)

## 2021-03-19 ENCOUNTER — Telehealth: Payer: Self-pay

## 2021-03-19 DIAGNOSIS — E7800 Pure hypercholesterolemia, unspecified: Secondary | ICD-10-CM | POA: Diagnosis not present

## 2021-03-19 DIAGNOSIS — S79929A Unspecified injury of unspecified thigh, initial encounter: Secondary | ICD-10-CM | POA: Diagnosis not present

## 2021-03-19 DIAGNOSIS — N390 Urinary tract infection, site not specified: Secondary | ICD-10-CM | POA: Diagnosis not present

## 2021-03-19 DIAGNOSIS — H8110 Benign paroxysmal vertigo, unspecified ear: Secondary | ICD-10-CM | POA: Diagnosis not present

## 2021-03-19 DIAGNOSIS — R42 Dizziness and giddiness: Secondary | ICD-10-CM | POA: Diagnosis not present

## 2021-03-19 DIAGNOSIS — I10 Essential (primary) hypertension: Secondary | ICD-10-CM | POA: Diagnosis not present

## 2021-03-19 DIAGNOSIS — Z950 Presence of cardiac pacemaker: Secondary | ICD-10-CM | POA: Diagnosis not present

## 2021-03-19 DIAGNOSIS — S72115A Nondisplaced fracture of greater trochanter of left femur, initial encounter for closed fracture: Secondary | ICD-10-CM | POA: Diagnosis not present

## 2021-03-19 DIAGNOSIS — S72102D Unspecified trochanteric fracture of left femur, subsequent encounter for closed fracture with routine healing: Secondary | ICD-10-CM | POA: Diagnosis not present

## 2021-03-19 DIAGNOSIS — M25552 Pain in left hip: Secondary | ICD-10-CM | POA: Diagnosis not present

## 2021-03-19 DIAGNOSIS — K529 Noninfective gastroenteritis and colitis, unspecified: Secondary | ICD-10-CM | POA: Diagnosis not present

## 2021-03-19 DIAGNOSIS — R410 Disorientation, unspecified: Secondary | ICD-10-CM | POA: Diagnosis not present

## 2021-03-19 DIAGNOSIS — G609 Hereditary and idiopathic neuropathy, unspecified: Secondary | ICD-10-CM | POA: Diagnosis not present

## 2021-03-19 DIAGNOSIS — F22 Delusional disorders: Secondary | ICD-10-CM | POA: Diagnosis not present

## 2021-03-19 DIAGNOSIS — E43 Unspecified severe protein-calorie malnutrition: Secondary | ICD-10-CM | POA: Diagnosis not present

## 2021-03-19 DIAGNOSIS — S72002D Fracture of unspecified part of neck of left femur, subsequent encounter for closed fracture with routine healing: Secondary | ICD-10-CM | POA: Diagnosis not present

## 2021-03-19 DIAGNOSIS — L8990 Pressure ulcer of unspecified site, unspecified stage: Secondary | ICD-10-CM | POA: Diagnosis not present

## 2021-03-19 DIAGNOSIS — K5900 Constipation, unspecified: Secondary | ICD-10-CM | POA: Diagnosis not present

## 2021-03-19 DIAGNOSIS — I480 Paroxysmal atrial fibrillation: Secondary | ICD-10-CM | POA: Diagnosis not present

## 2021-03-19 DIAGNOSIS — Z7401 Bed confinement status: Secondary | ICD-10-CM | POA: Diagnosis not present

## 2021-03-19 DIAGNOSIS — F419 Anxiety disorder, unspecified: Secondary | ICD-10-CM | POA: Diagnosis not present

## 2021-03-19 DIAGNOSIS — N179 Acute kidney failure, unspecified: Secondary | ICD-10-CM | POA: Diagnosis not present

## 2021-03-19 DIAGNOSIS — E039 Hypothyroidism, unspecified: Secondary | ICD-10-CM | POA: Diagnosis not present

## 2021-03-19 DIAGNOSIS — M1990 Unspecified osteoarthritis, unspecified site: Secondary | ICD-10-CM | POA: Diagnosis not present

## 2021-03-19 DIAGNOSIS — I119 Hypertensive heart disease without heart failure: Secondary | ICD-10-CM | POA: Diagnosis not present

## 2021-03-19 MED ORDER — METRONIDAZOLE 500 MG PO TABS: 500.0000 mg | ORAL_TABLET | Freq: Three times a day (TID) | ORAL | 0 refills | Status: AC

## 2021-03-19 MED ORDER — TRAMADOL HCL 50 MG PO TABS: 25.0000 mg | ORAL_TABLET | Freq: Three times a day (TID) | ORAL | 0 refills | Status: DC | PRN

## 2021-03-19 MED ORDER — CEPHALEXIN 500 MG PO CAPS: 500.0000 mg | ORAL_CAPSULE | Freq: Two times a day (BID) | ORAL | 0 refills | Status: AC

## 2021-03-19 NOTE — TOC Progression Note (Addendum)
Transition of Care [facility]) - Progression Note    Patient Details  Name: Deborah Shields MRN: [id number] Date of Birth: 24-Dec-1940  [facility]) CM/SW Contact  Zenon Mayo, RN Phone Number: 03/19/2021, 9:17 AM  Clinical Narrative:    NCM left vm for Claiborne Billings at [facility] to return call regarding a bed for this patient today. Per Claiborne Billings , returned call, yes she has a bed for patient today preferably after 2 pm .        Expected Discharge Plan and Services           Expected Discharge Date: 03/17/21                                     Social Determinants of Health (SDOH) Interventions    Readmission Risk Interventions No flowsheet data found.

## 2021-03-19 NOTE — Progress Notes (Signed)
RN called [facility] and gave report. RN removed pt's PIV. Pt belongings with pt's daughter. PTAR to transport pt to [facility]. D/c summary and Rx in packet given to PTAR.

## 2021-03-19 NOTE — Plan of Care (Signed)

## 2021-03-19 NOTE — Progress Notes (Signed)
Seen and examined  No events last night. Daughter at bedside  Stable for d/c today to SNF reviewed dc summary and updated- changed tramadol to tid prn and one more days on cephalexin and flagyl  Discharge date 03/19/21

## 2021-03-19 NOTE — Consult Note (Signed)
   [facility] Inpatient Consult   03/19/2021  Deborah Shields 06/17/1941 [id number]  [facility] [ACO] Patient: Medicare CMS DCE  Primary Care Provider:  Lajean Manes, MD, [facility] Physician, Roselee Nova  Patient can be followed by [facility] RN [facility] ([facility]) with traditional Medicare for any known or needs for transitional care needs for returning to post facility care or complex disease management. Chart review for disposition needs for returning to community.  For questions or referrals, please contact:   Natividad Brood, RN BSN [facility] Liaison  [phone] business mobile phone Toll free office [phone]  Fax number: [phone] [email or web address]

## 2021-03-19 NOTE — Progress Notes (Signed)
Physical Therapy Treatment Patient Details Name: Deborah Shields MRN: [id number] DOB: 07-08-1940 Today's Date: 03/19/2021   History of Present Illness 80 y.o. female presents to [facility] ED on 03/12/2021 after being found down and hallucinating by daughter. Pt found to have L greater trochanter fx and UTI. PMH includes PAF, tachybrady syndrome status post PPM, CKD stage IIIb, hyperlipidemia, hypertension, hypothyroidism, PVD, history of PE, carotid stenosis, anemia.    PT Comments    Patient reports not being OOB for a few days. Mobility limited today secondary to dizziness, N/V. Tolerated short distance gait training with Min A for balance/safety with use of RW for support. Requires more assist to stand from all surfaces today and cues for technique. Reviewed no hip abduction as pt does not recall this precaution. Tolerated there ex of LEs and only complaint is with left shoulder due to bursitis. Continues to be appropriate for SNF. Will follow.    Recommendations for follow up therapy are one component of a multi-disciplinary discharge planning process, led by the attending physician.  Recommendations may be updated based on patient status, additional functional criteria and insurance authorization.  Follow Up Recommendations  SNF     Equipment Recommendations  Rolling walker with 5" wheels    Recommendations for Other Services       Precautions / Restrictions Precautions Precautions: Fall Restrictions Weight Bearing Restrictions: Yes LLE Weight Bearing: Weight bearing as tolerated     Mobility  Bed Mobility Overal bed mobility: Needs Assistance Bed Mobility: Supine to Sit     Supine to sit: Min assist;HOB elevated Sit to supine: Mod assist   General bed mobility comments: Min A to scoot bottom to EOB; + dizziness. Assist to bring LEs into bed to return to supine.    Transfers Overall transfer level: Needs assistance Equipment used: Rolling walker (2 wheeled) Transfers:  Sit to/from Stand Sit to Stand: Min assist         General transfer comment: Min A to power to standing with cues for hand placement as pt wanting to pull up on RW. Stood from [employer], from chair x1.  Ambulation/Gait Ambulation/Gait assistance: Min guard Gait Distance (Feet): 12 Feet Assistive device: Rolling walker (2 wheeled) Gait Pattern/deviations: Step-through pattern;Decreased stance time - left;Decreased step length - right;Trunk flexed;Narrow base of support Gait velocity: reduced   General Gait Details: Slow, mildly unsteady gait wtih decreased stance time LLE; + nausea and emesis requiring need to sit and return to room. BP slightly elevated. RN aware   Stairs             Wheelchair Mobility    Modified Rankin (Stroke Patients Only)       Balance Overall balance assessment: Needs assistance Sitting-balance support: No upper extremity supported;Feet supported Sitting balance-Leahy Scale: Fair     Standing balance support: During functional activity Standing balance-Leahy Scale: Poor Standing balance comment: Requires UE support ins tanding.                            Cognition Arousal/Alertness: Awake/alert Behavior During Therapy: WFL for tasks assessed/performed Overall Cognitive Status: History of cognitive impairments - at baseline                                 General Comments: dtr reports ~2 year history of cognitive decline. Pt appropriate during session. "i don't even remember which leg is  supposed to hurt."      Exercises General Exercises - Lower Extremity Ankle Circles/Pumps: AROM;Both;10 reps;Supine Quad Sets: AROM;Both;5 reps;Supine    General Comments General comments (skin integrity, edema, etc.): Daughter present during session.      Pertinent Vitals/Pain Pain Assessment: Faces Faces Pain Scale: Hurts a little bit Pain Location: left shoulder due to hx of bursitis Pain Descriptors / Indicators:  Sore;Aching Pain Intervention(s): Monitored during session;Heat applied    Home Living                      Prior Function            PT Goals (current goals can now be found in the care plan section) Progress towards PT goals: Not progressing toward goals - comment (due to dizziness, N/V)    Frequency    Min 3X/week      PT Plan Current plan remains appropriate    Co-evaluation              AM-PAC PT "6 Clicks" Mobility   Outcome Measure  Help needed turning from your back to your side while in a flat bed without using bedrails?: A Little Help needed moving from lying on your back to sitting on the side of a flat bed without using bedrails?: A Little Help needed moving to and from a bed to a chair (including a wheelchair)?: A Little Help needed standing up from a chair using your arms (e.g., wheelchair or bedside chair)?: A Little Help needed to walk in hospital room?: A Little Help needed climbing 3-5 steps with a railing? : A Lot 6 Click Score: 17    End of Session Equipment Utilized During Treatment: Gait belt Activity Tolerance: Other (comment) (limited due to dizziness, nausea and vomiting) Patient left: in bed;with call bell/phone within reach;with nursing/sitter in room;with family/visitor present Nurse Communication: Mobility status;Other (comment) (nausea, vomiting) PT Visit Diagnosis: Other abnormalities of gait and mobility (R26.89);Repeated falls (R29.6);History of falling (Z91.81);Pain Pain - Right/Left: Left Pain - part of body: Shoulder     Time: 0623-7628 PT Time Calculation (min) (ACUTE ONLY): 21 min  Charges:  $Therapeutic Activity: 8-22 mins                     Marisa Severin, PT, DPT Acute Rehabilitation Services Pager [phone] Office [phone]      Marguarite Arbour A Sabra Heck 03/19/2021, 9:50 AM

## 2021-03-19 NOTE — TOC Transition Note (Signed)
Transition of Care [facility]) - CM/SW Discharge Note   Patient Details  Name: Deborah Shields MRN: [id number] Date of Birth: 11-12-1940  Transition of Care [facility]) CM/SW Contact:  Tresa Endo Phone Number: 03/19/2021, 12:44 PM   Clinical Narrative:    Patient will DC to: [facility] Anticipated DC date: 03/19/2021 Family notified: Pt Daughter Transport by: Corey Harold   Per MD patient ready for DC to [facility] room [phone]. RN to call report prior to discharge ([phone]). RN, patient, patient's family, and facility notified of DC. Discharge Summary and FL2 sent to facility. DC packet on chart. Ambulance transport requested for patient.   CSW will sign off for now as social work intervention is no longer needed. Please consult [place] again if new needs arise.           Patient Goals and CMS Choice        Discharge Placement                       Discharge Plan and Services                                     Social Determinants of Health (SDOH) Interventions     Readmission Risk Interventions No flowsheet data found.

## 2021-03-19 NOTE — Telephone Encounter (Signed)
Patient son called in stating patient is now in rehab for a broken hip and will be there for a while. I let patient son know her next transmission is 06/04/2021 and he thinks she will be home by then to make that one, he states if she is not he will bring the machine to rehab since he cant do it right now.

## 2021-03-20 DIAGNOSIS — S72102D Unspecified trochanteric fracture of left femur, subsequent encounter for closed fracture with routine healing: Secondary | ICD-10-CM | POA: Diagnosis not present

## 2021-03-20 DIAGNOSIS — K529 Noninfective gastroenteritis and colitis, unspecified: Secondary | ICD-10-CM | POA: Diagnosis not present

## 2021-03-20 DIAGNOSIS — I480 Paroxysmal atrial fibrillation: Secondary | ICD-10-CM | POA: Diagnosis not present

## 2021-03-20 DIAGNOSIS — N390 Urinary tract infection, site not specified: Secondary | ICD-10-CM | POA: Diagnosis not present

## 2021-03-20 DIAGNOSIS — N179 Acute kidney failure, unspecified: Secondary | ICD-10-CM | POA: Diagnosis not present

## 2021-03-28 DIAGNOSIS — L8990 Pressure ulcer of unspecified site, unspecified stage: Secondary | ICD-10-CM | POA: Diagnosis not present

## 2021-03-28 DIAGNOSIS — S72002D Fracture of unspecified part of neck of left femur, subsequent encounter for closed fracture with routine healing: Secondary | ICD-10-CM | POA: Diagnosis not present

## 2021-03-28 DIAGNOSIS — Z950 Presence of cardiac pacemaker: Secondary | ICD-10-CM | POA: Diagnosis not present

## 2021-04-04 ENCOUNTER — Ambulatory Visit: Payer: Medicare Other | Admitting: Physician Assistant

## 2021-04-10 DIAGNOSIS — R42 Dizziness and giddiness: Secondary | ICD-10-CM | POA: Diagnosis not present

## 2021-04-10 DIAGNOSIS — S72002D Fracture of unspecified part of neck of left femur, subsequent encounter for closed fracture with routine healing: Secondary | ICD-10-CM | POA: Diagnosis not present

## 2021-04-12 ENCOUNTER — Ambulatory Visit: Payer: Self-pay

## 2021-04-12 ENCOUNTER — Ambulatory Visit (INDEPENDENT_AMBULATORY_CARE_PROVIDER_SITE_OTHER): Payer: Medicare Other | Admitting: Physician Assistant

## 2021-04-12 DIAGNOSIS — S72002D Fracture of unspecified part of neck of left femur, subsequent encounter for closed fracture with routine healing: Secondary | ICD-10-CM | POA: Diagnosis not present

## 2021-04-12 DIAGNOSIS — R42 Dizziness and giddiness: Secondary | ICD-10-CM | POA: Diagnosis not present

## 2021-04-12 DIAGNOSIS — M25552 Pain in left hip: Secondary | ICD-10-CM | POA: Diagnosis not present

## 2021-04-12 NOTE — Progress Notes (Signed)
Office Visit Note   Patient: Deborah Shields           Date of Birth: [birth year]           MRN: [id number] Visit Date: 04/12/2021              Requested by: Lajean Manes, MD [address] [place],  [place] [id number] PCP: Lajean Manes, MD   Assessment & Plan: Visit Diagnoses:  1. Pain in left hip     Plan: She is weightbearing as tolerated activities as tolerated without restrictions.  Follow-up with [place] as needed.  Questions were encouraged and answered at length today.  Follow-Up Instructions: Return if symptoms worsen or fail to improve.   Orders:  Orders Placed This Encounter  Procedures   XR HIP UNILAT W OR W/O PELVIS 2-3 VIEWS LEFT   No orders of the defined types were placed in this encounter.     Procedures: No procedures performed   Clinical Data: No additional findings.   Subjective: Chief Complaint  Patient presents with   Left Hip - Pain    HPI Deborah Shields is seen today due to a left hip mildly displaced comminuted fracture through the left greater trochanter.  Patient was consulted on by Hilbert Odor, PA-C at [facility] and is here to day for follow-up.  She states she ambulates with a rolling walker.  She is having no significant pain.  She feels like she is greatly improved.  She does have occasional discomfort in the hip though.  She is currently at [facility] for rehab. CT scan which is reviewed showed a mildly displaced comminuted fracture of the left greater trochanter no extension into the intertrochanteric region of the femur or the femoral neck. Review of Systems See HPI otherwise negative  Objective: Vital Signs: There were no vitals taken for this visit.  Physical Exam General well-developed well-nourished female seated in wheelchair today. Ortho Exam Left hip excellent range of motion without pain.  Calf supple nontender dorsiflexion plantarflexion left ankle intact. Specialty Comments:  No specialty  comments available.  Imaging: XR HIP UNILAT W OR W/O PELVIS 2-3 VIEWS LEFT  Result Date: 04/12/2021 AP pelvis and a single view of the left hip shows both hips to be well located.  Tip of the greater trochanter fracture fragment remains in overall unchanged position alignment.  There is sclerotic activity to suggest healing.  No other fractures identified.    PMFS History: Patient Active Problem List   Diagnosis Date Noted   Pressure injury of skin 03/14/2021   Protein-calorie malnutrition, severe 03/14/2021   UTI (urinary tract infection) 03/13/2021   Colitis 03/13/2021   Closed nondisplaced fracture of greater trochanter of left femur (HCC)    AKI (acute kidney injury) ([place]) 01/23/2021   Hypertension, accelerated with heart disease, without CHF 09/19/2020   Atypical chest pain 09/18/2020   SSS (sick sinus syndrome) ([place]) 12/06/2019   Pacemaker 12/06/2019   Hypercholesterolemia 12/06/2019   Underweight 12/06/2019   Pneumonia of right upper lobe due to methicillin resistant Staphylococcus aureus (MRSA) ([place])    Pneumonia due to infectious organism    Community acquired pneumonia 08/16/2018   Malnutrition of moderate degree 07/16/2017   Hemoptysis 07/15/2017   HNP (herniated nucleus pulposus), lumbar 03/20/2017   Edema of both feet 12/10/2016   Tick bite 11/21/2015   Abnormal urine odor 11/21/2015   Other fatigue 11/21/2015   Anxiety 08/15/2015   Encounter for pre-operative cardiovascular clearance 07/10/2015  Pre-operative clearance 07/07/2015   Rotator cuff tear 07/07/2015   Rectocele, female 08/04/2014   COPD with chronic bronchitis and emphysema ([place]) 02/14/2014   Other malaise and fatigue 12/31/2013   PAF (paroxysmal atrial fibrillation) ([place]) 01/10/2013   Thrombosed external hemorrhoid 11/26/2012   Chronic renal insufficiency, stage III (moderate) (HCC) 09/03/2011   Atherosclerosis of native arteries of the extremities with intermittent claudication 04/04/2011    Occlusion and stenosis of carotid artery without mention of cerebral infarction 04/04/2011   PERIPHERAL NEUROPATHY 08/15/2008   Hypothyroidism (acquired) 08/12/2007   Hyperlipidemia with target LDL less than 100 08/12/2007   Essential hypertension 08/12/2007   Osteopenia 08/12/2007   Carotid artery stenosis, asymptomatic 08/11/2007   Osteoarthritis 08/11/2007   BASAL CELL CARCINOMA, HX OF 08/11/2007   FIBROCYSTIC BREAST DISEASE 11/18/2006   DIVERTICULOSIS, COLON 08/10/2006   Past Medical History:  Diagnosis Date   Anxiety    Aortic insufficiency    mild due to degenerative changes   Arthritis    Basal cell carcinoma 2007   [place] [facility] Left leg   Carotid artery occlusion    Chronic kidney disease    CRD Stage 3   Conjunctivitis due to adenovirus, both eyes    Degenerative joint disease    Diverticulosis of colon    Dyspnea    with exertion   Heart murmur    Hyperlipidemia    NMR 07/2009: LDL 200 (2260/1233)TG 99, HDL 65. LDL goal =<120, ideally <100. father MI @ 26   Hypertension    Hypothyroidism    Dr Wilson Singer   Microscopic hematuria    Peripheral neuropathy    compressive in UE bilaterally; Dr Daylene Katayama   Peripheral vascular disease ([place])    ICA bilat, Dr.Charles fields, [facility]   Pneumonia    2017   PONV (postoperative nausea and vomiting)    "Inner ear, does  okay with Scopolamine"   Pulmonary embolus ([place])    Rectocele    [place] spotted fever     Family History  Problem Relation Age of Onset   Heart attack Father 6   Heart disease Father        Before age 33   Colon cancer Mother 77   Stroke Mother 40   Kidney disease Mother        ? renal calculi; S/P resecton of kidney   Cancer - Colon Mother 36   Ovarian cancer Sister    Other Sister        Valve replacement (aortic & mitral ) in 2 sisters   Heart disease Sister        Before age 83   Heart disease Brother        aortic & mitral valve replacement in 2 bro; both had CBAG   Diabetes Neg Hx     Breast cancer Neg Hx     Past Surgical History:  Procedure Laterality Date   ABDOMINAL HYSTERECTOMY  48   age 65 due to dysfuctional menses; HRT x 25-30 years   APPENDECTOMY  1952   basal cell cancer  03/2006   leg   BILATERAL OOPHORECTOMY  1990   prophylactically (sister had ovarian ca)   BRONCHIAL BRUSHINGS  08/30/2019   Procedure: BRONCHIAL BRUSHINGS;  Surgeon: Candee Furbish, MD;  Location: [facility];  Service: Endoscopy;;   BRONCHIAL WASHINGS  08/30/2019   Procedure: BRONCHIAL WASHINGS;  Surgeon: Candee Furbish, MD;  Location: [facility];  Service: Endoscopy;;   CARPAL TUNNEL RELEASE Bilateral 1989  right   CATARACT EXTRACTION, BILATERAL  2010   Dr Kathrin Penner   CHOLECYSTECTOMY  2006   COLONOSCOPY     Dr Ardis Hughs   EYE SURGERY Left    Bilateral eye (film removed lt eye 01/09/17)   LUMBAR LAMINECTOMY/DECOMPRESSION MICRODISCECTOMY Left 03/20/2017   Procedure: LEFT LUMBAR FOURLUMBAR FIVE LAMINOTOMY AND MICROICRODISCECTOMY 1 LEVEL;  Surgeon: Jovita Gamma, MD;  Location: [facility];  Service: Neurosurgery;  Laterality: Left;  LEFT   NM MYOVIEW LTD  06/13/2008   low risk scan   ORTHOPEDIC SURGERY  1989/200/2002/2012   elbows,shoulder surgery x 3, right hand   PACEMAKER IMPLANT N/A 09/06/2019   Procedure: PACEMAKER IMPLANT;  Surgeon: Evans Lance, MD;  Location: [facility];  Service: Cardiovascular;  Laterality: N/A;   RECTOCELE REPAIR  2016   TONSILLECTOMY  1957   US ECHOCARDIOGRAPHY  05/01/2010   trace MR,AI,TR;EF =>55%   VIDEO BRONCHOSCOPY Bilateral 07/17/2017   Procedure: VIDEO BRONCHOSCOPY WITHOUT FLUORO;  Surgeon: Rigoberto Noel, MD;  Location: [facility];  Service: Cardiopulmonary;  Laterality: Bilateral;   VIDEO BRONCHOSCOPY Bilateral 09/02/2017   Procedure: VIDEO BRONCHOSCOPY WITHOUT FLUORO;  Surgeon: Rigoberto Noel, MD;  Location: [facility];  Service: Cardiopulmonary;  Laterality: Bilateral;   VIDEO BRONCHOSCOPY N/A 08/30/2019   Procedure: VIDEO  BRONCHOSCOPY WITHOUT FLUORO;  Surgeon: Candee Furbish, MD;  Location: [facility];  Service: Endoscopy;  Laterality: N/A;   Social History   Occupational History   Occupation: retired  Tobacco Use   Smoking status: Never   Smokeless tobacco: Never  Vaping Use   Vaping Use: Never used  Substance and Sexual Activity   Alcohol use: No    Alcohol/week: 0.0 standard drinks   Drug use: No   Sexual activity: Not on file

## 2021-04-24 DIAGNOSIS — F22 Delusional disorders: Secondary | ICD-10-CM | POA: Diagnosis not present

## 2021-04-24 DIAGNOSIS — R410 Disorientation, unspecified: Secondary | ICD-10-CM | POA: Diagnosis not present

## 2021-04-24 DIAGNOSIS — S72002D Fracture of unspecified part of neck of left femur, subsequent encounter for closed fracture with routine healing: Secondary | ICD-10-CM | POA: Diagnosis not present

## 2021-04-25 DIAGNOSIS — H8110 Benign paroxysmal vertigo, unspecified ear: Secondary | ICD-10-CM | POA: Diagnosis not present

## 2021-04-25 DIAGNOSIS — R42 Dizziness and giddiness: Secondary | ICD-10-CM | POA: Diagnosis not present

## 2021-04-26 DIAGNOSIS — S72002D Fracture of unspecified part of neck of left femur, subsequent encounter for closed fracture with routine healing: Secondary | ICD-10-CM | POA: Diagnosis not present

## 2021-04-26 DIAGNOSIS — R410 Disorientation, unspecified: Secondary | ICD-10-CM | POA: Diagnosis not present

## 2021-05-03 DIAGNOSIS — N3000 Acute cystitis without hematuria: Secondary | ICD-10-CM | POA: Diagnosis not present

## 2021-05-03 DIAGNOSIS — R410 Disorientation, unspecified: Secondary | ICD-10-CM | POA: Diagnosis not present

## 2021-05-08 DIAGNOSIS — R410 Disorientation, unspecified: Secondary | ICD-10-CM | POA: Diagnosis not present

## 2021-05-08 DIAGNOSIS — N3000 Acute cystitis without hematuria: Secondary | ICD-10-CM | POA: Diagnosis not present

## 2021-05-08 DIAGNOSIS — S72002D Fracture of unspecified part of neck of left femur, subsequent encounter for closed fracture with routine healing: Secondary | ICD-10-CM | POA: Diagnosis not present

## 2021-05-09 DIAGNOSIS — W19XXXA Unspecified fall, initial encounter: Secondary | ICD-10-CM | POA: Diagnosis not present

## 2021-05-09 DIAGNOSIS — R2689 Other abnormalities of gait and mobility: Secondary | ICD-10-CM | POA: Diagnosis not present

## 2021-05-09 DIAGNOSIS — M6281 Muscle weakness (generalized): Secondary | ICD-10-CM | POA: Diagnosis not present

## 2021-05-09 DIAGNOSIS — Z9181 History of falling: Secondary | ICD-10-CM | POA: Diagnosis not present

## 2021-05-10 DIAGNOSIS — N184 Chronic kidney disease, stage 4 (severe): Secondary | ICD-10-CM | POA: Diagnosis not present

## 2021-05-10 DIAGNOSIS — I129 Hypertensive chronic kidney disease with stage 1 through stage 4 chronic kidney disease, or unspecified chronic kidney disease: Secondary | ICD-10-CM | POA: Diagnosis not present

## 2021-05-10 DIAGNOSIS — E7800 Pure hypercholesterolemia, unspecified: Secondary | ICD-10-CM | POA: Diagnosis not present

## 2021-05-10 DIAGNOSIS — E039 Hypothyroidism, unspecified: Secondary | ICD-10-CM | POA: Diagnosis not present

## 2021-05-10 DIAGNOSIS — I480 Paroxysmal atrial fibrillation: Secondary | ICD-10-CM | POA: Diagnosis not present

## 2021-05-10 DIAGNOSIS — J449 Chronic obstructive pulmonary disease, unspecified: Secondary | ICD-10-CM | POA: Diagnosis not present

## 2021-05-11 DIAGNOSIS — Z9181 History of falling: Secondary | ICD-10-CM | POA: Diagnosis not present

## 2021-05-11 DIAGNOSIS — F0390 Unspecified dementia without behavioral disturbance: Secondary | ICD-10-CM | POA: Diagnosis not present

## 2021-05-11 DIAGNOSIS — M6281 Muscle weakness (generalized): Secondary | ICD-10-CM | POA: Diagnosis not present

## 2021-05-11 DIAGNOSIS — N390 Urinary tract infection, site not specified: Secondary | ICD-10-CM | POA: Diagnosis not present

## 2021-05-11 DIAGNOSIS — R410 Disorientation, unspecified: Secondary | ICD-10-CM | POA: Diagnosis not present

## 2021-05-12 ENCOUNTER — Emergency Department (HOSPITAL_COMMUNITY): Payer: Medicare Other

## 2021-05-12 ENCOUNTER — Encounter (HOSPITAL_COMMUNITY): Payer: Self-pay | Admitting: Emergency Medicine

## 2021-05-12 ENCOUNTER — Inpatient Hospital Stay (HOSPITAL_COMMUNITY)
Admission: EM | Admit: 2021-05-12 | Discharge: 2021-05-16 | DRG: 683 | Disposition: A | Discharge status: Skilled Nursing Facility | Payer: Medicare Other | Source: Skilled Nursing Facility | Attending: Internal Medicine | Admitting: Internal Medicine

## 2021-05-12 ENCOUNTER — Other Ambulatory Visit: Payer: Self-pay

## 2021-05-12 DIAGNOSIS — F0392 Unspecified dementia, unspecified severity, with psychotic disturbance: Secondary | ICD-10-CM | POA: Diagnosis present

## 2021-05-12 DIAGNOSIS — Z800 Family history of malignant neoplasm of digestive organs: Secondary | ICD-10-CM

## 2021-05-12 DIAGNOSIS — N184 Chronic kidney disease, stage 4 (severe): Secondary | ICD-10-CM | POA: Diagnosis present

## 2021-05-12 DIAGNOSIS — I119 Hypertensive heart disease without heart failure: Secondary | ICD-10-CM | POA: Diagnosis present

## 2021-05-12 DIAGNOSIS — Z66 Do not resuscitate: Secondary | ICD-10-CM | POA: Diagnosis present

## 2021-05-12 DIAGNOSIS — I739 Peripheral vascular disease, unspecified: Secondary | ICD-10-CM | POA: Diagnosis present

## 2021-05-12 DIAGNOSIS — Z79890 Hormone replacement therapy (postmenopausal): Secondary | ICD-10-CM

## 2021-05-12 DIAGNOSIS — R9082 White matter disease, unspecified: Secondary | ICD-10-CM | POA: Diagnosis not present

## 2021-05-12 DIAGNOSIS — R9431 Abnormal electrocardiogram [ECG] [EKG]: Secondary | ICD-10-CM

## 2021-05-12 DIAGNOSIS — I129 Hypertensive chronic kidney disease with stage 1 through stage 4 chronic kidney disease, or unspecified chronic kidney disease: Secondary | ICD-10-CM | POA: Diagnosis present

## 2021-05-12 DIAGNOSIS — Z8249 Family history of ischemic heart disease and other diseases of the circulatory system: Secondary | ICD-10-CM

## 2021-05-12 DIAGNOSIS — Z20822 Contact with and (suspected) exposure to covid-19: Secondary | ICD-10-CM | POA: Diagnosis not present

## 2021-05-12 DIAGNOSIS — Z823 Family history of stroke: Secondary | ICD-10-CM

## 2021-05-12 DIAGNOSIS — I495 Sick sinus syndrome: Secondary | ICD-10-CM | POA: Diagnosis present

## 2021-05-12 DIAGNOSIS — Z86711 Personal history of pulmonary embolism: Secondary | ICD-10-CM

## 2021-05-12 DIAGNOSIS — F03911 Unspecified dementia, unspecified severity, with agitation: Secondary | ICD-10-CM | POA: Diagnosis not present

## 2021-05-12 DIAGNOSIS — G9389 Other specified disorders of brain: Secondary | ICD-10-CM | POA: Diagnosis not present

## 2021-05-12 DIAGNOSIS — S0990XA Unspecified injury of head, initial encounter: Secondary | ICD-10-CM | POA: Diagnosis not present

## 2021-05-12 DIAGNOSIS — Z90710 Acquired absence of both cervix and uterus: Secondary | ICD-10-CM

## 2021-05-12 DIAGNOSIS — I482 Chronic atrial fibrillation, unspecified: Secondary | ICD-10-CM | POA: Diagnosis present

## 2021-05-12 DIAGNOSIS — S0083XA Contusion of other part of head, initial encounter: Secondary | ICD-10-CM | POA: Diagnosis present

## 2021-05-12 DIAGNOSIS — Z79899 Other long term (current) drug therapy: Secondary | ICD-10-CM

## 2021-05-12 DIAGNOSIS — Z8041 Family history of malignant neoplasm of ovary: Secondary | ICD-10-CM

## 2021-05-12 DIAGNOSIS — Y710 Diagnostic and monitoring cardiovascular devices associated with adverse incidents: Secondary | ICD-10-CM | POA: Diagnosis present

## 2021-05-12 DIAGNOSIS — T82119A Breakdown (mechanical) of unspecified cardiac electronic device, initial encounter: Secondary | ICD-10-CM | POA: Diagnosis present

## 2021-05-12 DIAGNOSIS — E860 Dehydration: Secondary | ICD-10-CM | POA: Diagnosis present

## 2021-05-12 DIAGNOSIS — I10 Essential (primary) hypertension: Secondary | ICD-10-CM | POA: Diagnosis not present

## 2021-05-12 DIAGNOSIS — Z85828 Personal history of other malignant neoplasm of skin: Secondary | ICD-10-CM

## 2021-05-12 DIAGNOSIS — E039 Hypothyroidism, unspecified: Secondary | ICD-10-CM | POA: Diagnosis present

## 2021-05-12 DIAGNOSIS — Z7901 Long term (current) use of anticoagulants: Secondary | ICD-10-CM

## 2021-05-12 DIAGNOSIS — N179 Acute kidney failure, unspecified: Secondary | ICD-10-CM | POA: Diagnosis not present

## 2021-05-12 DIAGNOSIS — R296 Repeated falls: Secondary | ICD-10-CM | POA: Diagnosis present

## 2021-05-12 DIAGNOSIS — W1830XA Fall on same level, unspecified, initial encounter: Secondary | ICD-10-CM | POA: Diagnosis present

## 2021-05-12 DIAGNOSIS — G238 Other specified degenerative diseases of basal ganglia: Secondary | ICD-10-CM | POA: Diagnosis not present

## 2021-05-12 DIAGNOSIS — R4182 Altered mental status, unspecified: Secondary | ICD-10-CM

## 2021-05-12 DIAGNOSIS — Z9049 Acquired absence of other specified parts of digestive tract: Secondary | ICD-10-CM

## 2021-05-12 DIAGNOSIS — Z950 Presence of cardiac pacemaker: Secondary | ICD-10-CM

## 2021-05-12 DIAGNOSIS — S0003XA Contusion of scalp, initial encounter: Secondary | ICD-10-CM | POA: Diagnosis not present

## 2021-05-12 DIAGNOSIS — E785 Hyperlipidemia, unspecified: Secondary | ICD-10-CM | POA: Diagnosis present

## 2021-05-12 LAB — CBC WITH DIFFERENTIAL/PLATELET
Abs Immature Granulocytes: 0.05 10*3/uL (ref 0.00–0.07)
Basophils Absolute: 0.1 10*3/uL (ref 0.0–0.1)
Basophils Relative: 1 %
Eosinophils Absolute: 0.3 10*3/uL (ref 0.0–0.5)
Eosinophils Relative: 3 %
HCT: 41.7 % (ref 36.0–46.0)
Hemoglobin: 13.2 g/dL (ref 12.0–15.0)
Immature Granulocytes: 1 %
Lymphocytes Relative: 15 %
Lymphs Abs: 1.3 10*3/uL (ref 0.7–4.0)
MCH: 30.2 pg (ref 26.0–34.0)
MCHC: 31.7 g/dL (ref 30.0–36.0)
MCV: 95.4 fL (ref 80.0–100.0)
Monocytes Absolute: 0.8 10*3/uL (ref 0.1–1.0)
Monocytes Relative: 9 %
Neutro Abs: 6.3 10*3/uL (ref 1.7–7.7)
Neutrophils Relative %: 71 %
Platelets: 304 10*3/uL (ref 150–400)
RBC: 4.37 MIL/uL (ref 3.87–5.11)
RDW: 15.9 % — ABNORMAL HIGH (ref 11.5–15.5)
WBC: 8.8 10*3/uL (ref 4.0–10.5)
nRBC: 0 % (ref 0.0–0.2)

## 2021-05-12 LAB — URINALYSIS, ROUTINE W REFLEX MICROSCOPIC
Bacteria, UA: NONE SEEN
Bilirubin Urine: NEGATIVE
Glucose, UA: NEGATIVE mg/dL
Ketones, ur: NEGATIVE mg/dL
Nitrite: NEGATIVE
Protein, ur: 30 mg/dL — AB
RBC / HPF: >50 RBC/hpf — ABNORMAL HIGH (ref 0–5)
Specific Gravity, Urine: 1.013 (ref 1.005–1.030)
pH: 6 (ref 5.0–8.0)

## 2021-05-12 LAB — BASIC METABOLIC PANEL
Anion gap: 11 (ref 5–15)
BUN: 33 mg/dL — ABNORMAL HIGH (ref 8–23)
CO2: 27 mmol/L (ref 22–32)
Calcium: 9.3 mg/dL (ref 8.9–10.3)
Chloride: 100 mmol/L (ref 98–111)
Creatinine, Ser: 2.83 mg/dL — ABNORMAL HIGH (ref 0.44–1.00)
GFR, Estimated: 16 mL/min — ABNORMAL LOW (ref >60)
Glucose, Bld: 114 mg/dL — ABNORMAL HIGH (ref 70–99)
Potassium: 3.9 mmol/L (ref 3.5–5.1)
Sodium: 138 mmol/L (ref 135–145)

## 2021-05-12 LAB — RESP PANEL BY RT-PCR (FLU A&B, COVID) ARPGX2
Influenza A by PCR: NEGATIVE
Influenza B by PCR: NEGATIVE
SARS Coronavirus 2 by RT PCR: NEGATIVE

## 2021-05-12 MED ORDER — DOCUSATE SODIUM 100 MG PO CAPS
100.0000 mg | ORAL_CAPSULE | Freq: Two times a day (BID) | ORAL | Status: DC
  Administered 2021-05-12 – 2021-05-16 (×7): 100 mg via ORAL
  Filled 2021-05-12 (×9): qty 1

## 2021-05-12 MED ORDER — MECLIZINE HCL 12.5 MG PO TABS
12.5000 mg | ORAL_TABLET | Freq: Three times a day (TID) | ORAL | Status: DC | PRN
  Administered 2021-05-16: 12.5 mg via ORAL
  Filled 2021-05-12 (×2): qty 1

## 2021-05-12 MED ORDER — APIXABAN 2.5 MG PO TABS
2.5000 mg | ORAL_TABLET | Freq: Two times a day (BID) | ORAL | Status: DC
  Administered 2021-05-12 – 2021-05-16 (×8): 2.5 mg via ORAL
  Filled 2021-05-12 (×9): qty 1

## 2021-05-12 MED ORDER — POLYETHYLENE GLYCOL 3350 17 G PO PACK: 17.0000 g | PACK | Freq: Every day | ORAL | Status: DC | PRN

## 2021-05-12 MED ORDER — LACTATED RINGERS IV SOLN: INTRAVENOUS | Status: DC

## 2021-05-12 MED ORDER — FLECAINIDE ACETATE 50 MG PO TABS
75.0000 mg | ORAL_TABLET | Freq: Two times a day (BID) | ORAL | Status: DC
  Administered 2021-05-12 – 2021-05-13 (×2): 75 mg via ORAL
  Filled 2021-05-12 (×4): qty 2

## 2021-05-12 MED ORDER — BISACODYL 5 MG PO TBEC: 5.0000 mg | DELAYED_RELEASE_TABLET | Freq: Every day | ORAL | Status: DC | PRN

## 2021-05-12 MED ORDER — ACETAMINOPHEN 650 MG RE SUPP: 650.0000 mg | Freq: Four times a day (QID) | RECTAL | Status: DC | PRN

## 2021-05-12 MED ORDER — HYDRALAZINE HCL 25 MG PO TABS
25.0000 mg | ORAL_TABLET | Freq: Three times a day (TID) | ORAL | Status: DC | PRN
  Administered 2021-05-13 – 2021-05-14 (×2): 25 mg via ORAL
  Filled 2021-05-12 (×2): qty 1

## 2021-05-12 MED ORDER — TRAMADOL HCL 50 MG PO TABS: 25.0000 mg | ORAL_TABLET | Freq: Three times a day (TID) | ORAL | Status: DC | PRN

## 2021-05-12 MED ORDER — ONDANSETRON HCL 4 MG PO TABS: 4.0000 mg | ORAL_TABLET | Freq: Four times a day (QID) | ORAL | Status: DC | PRN

## 2021-05-12 MED ORDER — ACETAMINOPHEN 325 MG PO TABS
650.0000 mg | ORAL_TABLET | Freq: Four times a day (QID) | ORAL | Status: DC | PRN
  Administered 2021-05-16: 650 mg via ORAL
  Filled 2021-05-12 (×2): qty 2

## 2021-05-12 MED ORDER — ONDANSETRON HCL 4 MG/2ML IJ SOLN
4.0000 mg | Freq: Four times a day (QID) | INTRAMUSCULAR | Status: DC | PRN
  Administered 2021-05-13: 4 mg via INTRAVENOUS
  Filled 2021-05-12: qty 2

## 2021-05-12 MED ORDER — SODIUM CHLORIDE 0.9% FLUSH
3.0000 mL | Freq: Two times a day (BID) | INTRAVENOUS | Status: DC
  Administered 2021-05-12 – 2021-05-16 (×8): 3 mL via INTRAVENOUS

## 2021-05-12 MED ORDER — SODIUM CHLORIDE 0.9 % IV BOLUS
500.0000 mL | Freq: Once | INTRAVENOUS | Status: AC
  Administered 2021-05-12: 500 mL via INTRAVENOUS

## 2021-05-12 MED ORDER — METOPROLOL SUCCINATE ER 100 MG PO TB24
100.0000 mg | ORAL_TABLET | Freq: Every day | ORAL | Status: DC
  Administered 2021-05-12 – 2021-05-16 (×5): 100 mg via ORAL
  Filled 2021-05-12 (×5): qty 1

## 2021-05-12 MED ORDER — SENNA 8.6 MG PO TABS
1.0000 | ORAL_TABLET | ORAL | Status: DC
  Administered 2021-05-13 – 2021-05-15 (×2): 8.6 mg via ORAL
  Filled 2021-05-12 (×2): qty 1

## 2021-05-12 MED ORDER — OXYCODONE HCL 5 MG PO TABS
5.0000 mg | ORAL_TABLET | ORAL | Status: DC | PRN
  Administered 2021-05-14 – 2021-05-16 (×2): 5 mg via ORAL
  Filled 2021-05-12 (×2): qty 1

## 2021-05-12 MED ORDER — LEVOTHYROXINE SODIUM 88 MCG PO TABS
88.0000 ug | ORAL_TABLET | Freq: Every day | ORAL | Status: DC
  Administered 2021-05-13: 88 ug via ORAL
  Filled 2021-05-12 (×2): qty 1

## 2021-05-12 NOTE — ED Notes (Signed)
Pt placed on bedpan

## 2021-05-12 NOTE — ED Notes (Signed)
Pt educated to keep arm straight so bolus will infuse.  Also, Pt aware we need a urine sample.  Pt will let us know when she can provide a sample.  Pt prefers to use bedpan over bedside commode.

## 2021-05-12 NOTE — H&P (Signed)
History and Physical    Deborah Shields UQJ:[id number] DOB: 01/24/1941 DOA: 05/12/2021  PCP: Lajean Manes, MD Consultants:  [facility] - cardiology; [facility] - pulmonology; [facility] - vascular  Patient coming from: [facility] [place]: Daughter, Irineo Axon, [phone]  Chief Complaint: head trauma  HPI: Deborah Shields is a 80 y.o. female with medical history significant of stage 3 CKD; HTN; HLD; hypothyroidism; pacemaker placement; afib on Eliquis; PVD; and PE presenting with head trauma.  She was previously hospitalized from 9/19-26 with E coli UTI and proctitis/colitis; she came from home but was discharged to SNF for rehab.  She has probable baseline dementia and reported multiple falls during that hospitalization.  She reports that she has been treated for a UTI since then and that the facility continues to delay her dc to home based on recurrent infection.  She also reports that she was driving prior to last hospitalization. She awoke this AM and needed to void but her walker was out of reach so she decided to crawl to the bathroom.  When she got there she was unable to get herself out of the floor and fell back down, striking her head on the commode or the tub.  She has no current complaints and feels "like I should get up and go run a mile right now."  I spoke with her daughter.  She has dementia and this has been worsening for the last couple of years.  She is not sure whether she will stay at [facility] long-term.  She would like to take over her mom's affairs and thinks that it would be ideal for her to be evaluated for capacity.  She has hallucinations and paranoia.     ED Course: AMS, AKI with concern for dehydration.  She got up from bed without assistance and crawled to bathroom, hit her head on commode.  Has hematoma, negative head CT.  Has had recurrent AKI and UTI, no UTI currently.  Creatinine 2.8, previously 1.4.  Her daughter reports recent confusion.  Review of  Systems: As per HPI; otherwise review of systems reviewed and negative.   Ambulatory Status:  Ambulates with a walker  COVID Vaccine Status:  None; First shot; Complete  Past Medical History:  Diagnosis Date   Anxiety    Aortic insufficiency    mild due to degenerative changes   Arthritis    Basal cell carcinoma 2007   [facility] Left leg   Carotid artery occlusion    Chronic kidney disease    CRD Stage 3   Degenerative joint disease    Diverticulosis of colon    Heart murmur    Hyperlipidemia    NMR 07/2009: LDL 200 (2260/1233)TG 99, HDL 65. LDL goal =<120, ideally <100. father MI @ 60   Hypertension    Hypothyroidism    Dr Wilson Singer   Microscopic hematuria    Peripheral neuropathy    compressive in UE bilaterally; Dr Daylene Katayama   Peripheral vascular disease ([place])    ICA bilat, Dr.Charles fields, [facility]   Pneumonia    2017   PONV (postoperative nausea and vomiting)    "Inner ear, does  okay with Scopolamine"   Pulmonary embolus ([place])    Rectocele    [place] spotted fever     Past Surgical History:  Procedure Laterality Date   ABDOMINAL HYSTERECTOMY  1973   age 36 due to dysfuctional menses; HRT x 25-30 years   APPENDECTOMY  1952   basal cell cancer  03/2006   leg   BILATERAL OOPHORECTOMY  1990   prophylactically (sister had ovarian ca)   BRONCHIAL BRUSHINGS  08/30/2019   Procedure: BRONCHIAL BRUSHINGS;  Surgeon: Candee Furbish, MD;  Location: [facility];  Service: Endoscopy;;   BRONCHIAL WASHINGS  08/30/2019   Procedure: BRONCHIAL WASHINGS;  Surgeon: Candee Furbish, MD;  Location: [facility];  Service: Endoscopy;;   CARPAL TUNNEL RELEASE Bilateral 1989   right   CATARACT EXTRACTION, BILATERAL  2010   Dr Kathrin Penner   CHOLECYSTECTOMY  2006   COLONOSCOPY     Dr Ardis Hughs   EYE SURGERY Left    Bilateral eye (film removed lt eye 01/09/17)   LUMBAR LAMINECTOMY/DECOMPRESSION MICRODISCECTOMY Left 03/20/2017   Procedure: LEFT LUMBAR FOURLUMBAR FIVE LAMINOTOMY AND  MICROICRODISCECTOMY 1 LEVEL;  Surgeon: Jovita Gamma, MD;  Location: [place];  Service: Neurosurgery;  Laterality: Left;  LEFT   NM MYOVIEW LTD  06/13/2008   low risk scan   ORTHOPEDIC SURGERY  1989/200/2002/2012   elbows,shoulder surgery x 3, right hand   PACEMAKER IMPLANT N/A 09/06/2019   Procedure: PACEMAKER IMPLANT;  Surgeon: Evans Lance, MD;  Location: [place] [facility];  Service: Cardiovascular;  Laterality: N/A;   RECTOCELE REPAIR  2016   TONSILLECTOMY  1957   US ECHOCARDIOGRAPHY  05/01/2010   trace MR,AI,TR;EF =>55%   VIDEO BRONCHOSCOPY Bilateral 07/17/2017   Procedure: VIDEO BRONCHOSCOPY WITHOUT FLUORO;  Surgeon: Rigoberto Noel, MD;  Location: [facility];  Service: Cardiopulmonary;  Laterality: Bilateral;   VIDEO BRONCHOSCOPY Bilateral 09/02/2017   Procedure: VIDEO BRONCHOSCOPY WITHOUT FLUORO;  Surgeon: Rigoberto Noel, MD;  Location: [facility];  Service: Cardiopulmonary;  Laterality: Bilateral;   VIDEO BRONCHOSCOPY N/A 08/30/2019   Procedure: VIDEO BRONCHOSCOPY WITHOUT FLUORO;  Surgeon: Candee Furbish, MD;  Location: [facility];  Service: Endoscopy;  Laterality: N/A;    Social History   Socioeconomic History   Marital status: Widowed    Spouse name: Not on file   Number of children: 1   Years of education: Not on file   Highest education level: Not on file  Occupational History   Occupation: retired  Tobacco Use   Smoking status: Never   Smokeless tobacco: Never  Vaping Use   Vaping Use: Never used  Substance and Sexual Activity   Alcohol use: No    Alcohol/week: 0.0 standard drinks   Drug use: No   Sexual activity: Not on file  Other Topics Concern   Not on file  Social History Narrative   Low cholesterol diet   Regular exercise- yes    Social Determinants of Health   Financial Resource Strain: Not on file  Food Insecurity: Not on file  Transportation Needs: Not on file  Physical Activity: Not on file  Stress: Not on file  Social Connections: Not  on file  Intimate Partner Violence: Not on file    Allergies  Allergen Reactions   Penicillins Itching, Swelling and Other (See Comments)    REACTION: itching and edema   Aspirin Nausea Only and Other (See Comments)    REACTION: GI upset  ( pt can take 81 mg but NOT   325 mg ASA )   Lovastatin Nausea Only and Other (See Comments)    REACTION: nausea   Benazepril Hcl Other (See Comments)    Unknown   Colesevelam Hcl Other (See Comments)   Doxycycline Hyclate Other (See Comments)   Febuxostat     Other reaction(s): angioedema   Hydrocodone-Acetaminophen  Other reaction(s): irritable   Losartan    Paroxetine Other (See Comments)    Unknown   Statins Depletion [Acid Blockers Support] Other (See Comments)   Tape Other (See Comments)   Valacyclovir Hcl Nausea And Vomiting   Bupropion Hcl Other (See Comments)    REACTION: tinnitis   Ezetimibe Nausea And Vomiting and Other (See Comments)    REACTION: GI symptoms   Fenofibrate Other (See Comments)    Myalgia    Pravastatin Sodium Other (See Comments)    REACTION: elevated CPK - Muscle's in bilateral Leg    Family History  Problem Relation Age of Onset   Heart attack Father 35   Heart disease Father        Before age 16   Colon cancer Mother 50   Stroke Mother 35   Kidney disease Mother        ? renal calculi; S/P resecton of kidney   Cancer - Colon Mother 63   Ovarian cancer Sister    Other Sister        Valve replacement (aortic & mitral ) in 2 sisters   Heart disease Sister        Before age 3   Heart disease Brother        aortic & mitral valve replacement in 2 bro; both had CBAG   Diabetes Neg Hx    Breast cancer Neg Hx     Prior to Admission medications   Medication Sig Start Date End Date Taking? Authorizing Provider  apixaban (ELIQUIS) 2.5 MG TABS tablet Take 1 tablet (2.5 mg total) by mouth 2 (two) times daily. APPOINTMENT NEEDED FOR FURTHER REFILLS. CONTACT YOUR CARDIOLOGIST Patient taking differently:  Take 2.5 mg by mouth 2 (two) times daily. 02/15/21   Croitoru, Mihai, MD  b complex vitamins tablet Take 1 tablet by mouth daily after supper.    [provider]  B Complex-C (B-COMPLEX WITH VITAMIN C) tablet Take 1 tablet by mouth daily after supper. 03/17/21   Antonieta Pert, MD  cholecalciferol (VITAMIN D3) 25 MCG (1000 UNIT) tablet Take 1,000 Units by mouth daily.    [provider]  cyclobenzaprine (FLEXERIL) 10 MG tablet Take 10 mg by mouth 2 (two) times daily as needed for muscle spasms.    [provider]  diclofenac sodium (VOLTAREN) 1 % GEL Apply 2-4 g topically 4 (four) times daily as needed (for pain).     [provider]  feeding supplement (ENSURE ENLIVE / ENSURE PLUS) LIQD Take 237 mLs by mouth 2 (two) times daily between meals. 03/17/21   Antonieta Pert, MD  flecainide (TAMBOCOR) 50 MG tablet Take 1.5 tablets (75 mg total) by mouth 2 (two) times daily. TAKE 1 TABLET(50 MG) BY MOUTH EVERY 12 HOURS Patient taking differently: Take 75 mg by mouth 2 (two) times daily. 01/23/21   Baldwin Jamaica, PA-C  hydrALAZINE (APRESOLINE) 25 MG tablet Take 1 tablet (25 mg total) by mouth every 8 (eight) hours as needed (if systolic bp more than 662 mmhg). 09/19/20 09/19/21  Shelly Coss, MD  levothyroxine (SYNTHROID) 88 MCG tablet Take 88 mcg by mouth daily before breakfast.    [provider]  meclizine (ANTIVERT) 25 MG tablet Take 1 tablet (25 mg total) by mouth daily. 1/2 every 8 hrs prn for imbalance Patient taking differently: Take 12.5 mg by mouth 3 (three) times daily as needed (imbalance). 11/01/10   Hendricks Limes, MD  metoprolol succinate (TOPROL-XL) 100 MG 24 hr tablet Take  1 tablet (100 mg total) by mouth daily. Take with or immediately following a meal. 01/23/21 10/20/21  Baldwin Jamaica, PA-C  senna (SENOKOT) 8.6 MG tablet Take 1 tablet by mouth every other day.     [provider]  traMADol (ULTRAM) 50 MG tablet Take 0.5-1 tablets (25-50 mg  total) by mouth 3 (three) times daily as needed for up to 4 doses for moderate pain. 03/19/21   Antonieta Pert, MD    Physical Exam: Vitals:   05/12/21 1100 05/12/21 1130 05/12/21 1252 05/12/21 1300  BP: (!) 181/105 (!) 167/103  (!) 165/117  Pulse: 60 66 84 62  Resp: 13 14 14 14   Temp:      TempSrc:      SpO2: 97% 98% 98% 100%  Weight:      Height:         General:  Appears calm and comfortable and is in NAD, appears appropriate but some history is inconsistent Eyes:  PERRL, EOMI, normal lids, iris ENT:  grossly normal hearing, lips & tongue, mmm; some absent dentition Neck:  no LAD, masses or thyromegaly Cardiovascular:  RRR, no r/g. No LE edema.  Respiratory:   CTA bilaterally with no wheezes/rales/rhonchi.  Normal respiratory effort. Abdomen:  soft, NT, ND Skin:  no rash or induration seen on limited exam; L temporal hematoma from trauma     Musculoskeletal:  grossly normal tone BUE/BLE, good ROM, no bony abnormality Psychiatric:  grossly normal mood and affect, speech fluent and appropriate, AOx3 but with inconsistent statements that indicate confusion/delusion Neurologic:  CN 2-12 grossly intact, moves all extremities in coordinated fashion    Radiological Exams on Admission: Independently reviewed - see discussion in A/P where applicable  CT Head Wo Contrast  Result Date: 05/12/2021 CLINICAL DATA:  80 year old female status post fall on Eliquis. Left forehead hematoma. EXAM: CT HEAD WITHOUT CONTRAST TECHNIQUE: Contiguous axial images were obtained from the base of the skull through the vertex without intravenous contrast. COMPARISON:  Brain MRI 06/30/2003.  Head CT 03/12/2021. FINDINGS: Brain: No midline shift, ventriculomegaly, mass effect, evidence of mass lesion, intracranial hemorrhage or evidence of cortically based acute infarction. Patchy and confluent bilateral white matter hypodensity is stable. Stable vascular calcification in the left basal ganglia. Vascular:  Calcified atherosclerosis at the skull base. No suspicious intracranial vascular hyperdensity. Skull: Stable and intact. Sinuses/Orbits: Visualized paranasal sinuses and mastoids are clear. Other: Left anterior convexity, supraorbital scalp contusion and hematoma on series 4, image 29 up to 5 mm in thickness. Underlying calvarium intact. Negative other orbit and scalp soft tissues. IMPRESSION: 1. Left scalp soft tissue injury without underlying skull fracture. 2. No acute intracranial abnormality. Stable chronic white matter disease. Electronically Signed   By: Genevie Ann M.D.   On: 05/12/2021 07:49    EKG: Independently reviewed.  NSR with rate 67; prolonged QTc 629; IVCD, RBBB   Labs on Admission: I have personally reviewed the available labs and imaging studies at the time of the admission.  Pertinent labs:   Glucose 114 BUN 33/Creatinine 2.82/GFR 16; 29/1.91/26 on 9/23 Unremarkable CBC UA: moderate Hgb, trace LE, 30 protein, >50 RBC   Assessment/Plan Principal Problem:   AKI (acute kidney injury) ([place]) Active Problems:   Hypothyroidism (acquired)   Hyperlipidemia with target LDL less than 100   Stage 4 chronic kidney disease (HCC)   Hypertension, accelerated with heart disease, without CHF   Chronic a-fib (HCC)   AKI on stage 4 CKD -Patient with baseline compromised renal  function  -Current creatinine is > at least 1.5 times compared to baseline and presumed to have occurred within the last 7 days -Likely due to prerenal secondary to dehydration -IVF -Follow up renal function by BMP -Avoid nephrotoxic agents  Head trauma -Superficial hematoma -Head CT negative -No f/u needed  Possible dementia with possible delusional behavior -Patient is oriented x 3 but does say things that are not apparently accurate -Daughter confirms and is concerned about her capacity to make her own decisions -Psychiatry consulted at daughter's request  Afib -Rate control with Toprol XL -Continue  flecainide -Continue Eliquis for now, but given her fall and general unsteadiness it may be reasonable to dc it  HTN -Continue Toprol XL, hydralazine  Hypothyroidism -Continue Synthroid  DNR -She has a gold out of facility DNR form at the bedside    Note: This patient has been tested and is pending for the novel coronavirus COVID-19.   Level of care: Telemetry Medical DVT prophylaxis: Eliquis Code Status: DNR- confirmed with ACP paperwork Family Communication: None present; I spoke with the patient's daughter by telephone at the time of admission - she had surgery Monday and is unable to come to the hospital. Disposition Plan:  The patient is from: SNF  Anticipated d/c is to: SNF  Anticipated d/c date will depend on clinical response to treatment, but possibly as early as tomorrow if she has excellent response to treatment  Patient is currently: acutely ill Consults called: Psychiatry; neurology  Admission status:  It is my clinical opinion that referral for OBSERVATION is reasonable and necessary in this patient based on the above information provided. The aforementioned taken together are felt to place the patient at high risk for further clinical deterioration. However it is anticipated that the patient may be medically stable for discharge from the hospital within 24 to 48 hours.    Karmen Bongo MD [facility]   How to contact the [facility] Attending or Consulting provider [place] or covering provider during after hours [place], for this patient?  Check the care team in [facility] and look for a) attending/consulting [facility] provider listed and b) the [facility] team listed Log into [email or web address] and use [place] universal password to access. If you do not have the password, please contact the hospital operator. Locate the [facility] provider you are looking for under [facility] and page to a number that you can be directly reached. If you still have difficulty reaching the provider,  please page the [facility] (Director on Call) for the Hospitalists listed on amion for assistance.   05/12/2021, 1:20 PM

## 2021-05-12 NOTE — ED Triage Notes (Addendum)
Pt bib EMS from [facility] for head injury; was "crawling" to the bathroom and bumped her head on the toilet. Pt is on eliquis. No LOC. Hematoma noted to L side of forehead. Not c/o pain at this time.  EMS vitals: 178/98 HR 84, A fib (hx) 97% room air

## 2021-05-12 NOTE — ED Notes (Addendum)
Pt given water per request

## 2021-05-12 NOTE — ED Provider Notes (Signed)
[facility] EMERGENCY DEPARTMENT Provider Note   CSN: [id number] Arrival date & time: 05/12/21  1601     History Chief Complaint  Patient presents with   Head Injury    Deborah Shields is a 80 y.o. female medical history significant for peripheral vascular disease, peripheral neuropathy, chronic kidney disease, history of PE currently on Eliquis who presents after a head injury earlier this morning.  Patient recently reports that she is normally up with assistance at all times at her nursing home facility, she got up this morning felt that she needed to use the restroom and did not want to risk a fall so she crawled from her bed to the bathroom.  During her crawl she hit the left side of her forehead on the cabinet.  Hematoma noted on left side of the head.  Patient denies headache, lightheadedness, chest pain, shortness of breath, facial droop, weakness.  Patient reports that she did not feel abnormal this morning.  Pain is 0 out of 10 at rest at this time.  She has been taking her Eliquis as prescribed.  HPI     Past Medical History:  Diagnosis Date   Anxiety    Aortic insufficiency    mild due to degenerative changes   Arthritis    Basal cell carcinoma 2007   [facility] Left leg   Carotid artery occlusion    Chronic kidney disease    CRD Stage 3   Degenerative joint disease    Diverticulosis of colon    Heart murmur    Hyperlipidemia    NMR 07/2009: LDL 200 (2260/1233)TG 99, HDL 65. LDL goal =<120, ideally <100. father MI @ 73   Hypertension    Hypothyroidism    Dr Wilson Singer   Microscopic hematuria    Peripheral neuropathy    compressive in UE bilaterally; Dr Daylene Katayama   Peripheral vascular disease ([place])    ICA bilat, Dr.Charles fields, [facility]   Pneumonia    2017   PONV (postoperative nausea and vomiting)    "Inner ear, does  okay with Scopolamine"   Pulmonary embolus ([place])    Rectocele    [place] spotted fever     Patient Active Problem  List   Diagnosis Date Noted   Pressure injury of skin 03/14/2021   Protein-calorie malnutrition, severe 03/14/2021   UTI (urinary tract infection) 03/13/2021   Colitis 03/13/2021   Closed nondisplaced fracture of greater trochanter of left femur ([place])    AKI (acute kidney injury) ([place]) 01/23/2021   Hypertension, accelerated with heart disease, without CHF 09/19/2020   Atypical chest pain 09/18/2020   SSS (sick sinus syndrome) ([place]) 12/06/2019   Pacemaker 12/06/2019   Hypercholesterolemia 12/06/2019   Underweight 12/06/2019   Pneumonia of right upper lobe due to methicillin resistant Staphylococcus aureus (MRSA) ([place])    Pneumonia due to infectious organism    Community acquired pneumonia 08/16/2018   Malnutrition of moderate degree 07/16/2017   Hemoptysis 07/15/2017   HNP (herniated nucleus pulposus), lumbar 03/20/2017   Edema of both feet 12/10/2016   Tick bite 11/21/2015   Abnormal urine odor 11/21/2015   Other fatigue 11/21/2015   Anxiety 08/15/2015   Encounter for pre-operative cardiovascular clearance 07/10/2015   Pre-operative clearance 07/07/2015   Rotator cuff tear 07/07/2015   Rectocele, female 08/04/2014   COPD with chronic bronchitis and emphysema ([place]) 02/14/2014   Other malaise and fatigue 12/31/2013   PAF (paroxysmal atrial fibrillation) ([place]) 01/10/2013   Thrombosed external  hemorrhoid 11/26/2012   Chronic renal insufficiency, stage III (moderate) (HCC) 09/03/2011   Atherosclerosis of native arteries of the extremities with intermittent claudication 04/04/2011   Occlusion and stenosis of carotid artery without mention of cerebral infarction 04/04/2011   PERIPHERAL NEUROPATHY 08/15/2008   Hypothyroidism (acquired) 08/12/2007   Hyperlipidemia with target LDL less than 100 08/12/2007   Essential hypertension 08/12/2007   Osteopenia 08/12/2007   Carotid artery stenosis, asymptomatic 08/11/2007   Osteoarthritis 08/11/2007   BASAL CELL CARCINOMA, HX OF 08/11/2007    FIBROCYSTIC BREAST DISEASE 11/18/2006   DIVERTICULOSIS, COLON 08/10/2006    Past Surgical History:  Procedure Laterality Date   ABDOMINAL HYSTERECTOMY  1973   age 80 due to dysfuctional menses; HRT x 25-30 years   APPENDECTOMY  1952   basal cell cancer  03/2006   leg   BILATERAL OOPHORECTOMY  1990   prophylactically (sister had ovarian ca)   BRONCHIAL BRUSHINGS  08/30/2019   Procedure: BRONCHIAL BRUSHINGS;  Surgeon: Candee Furbish, MD;  Location: [facility];  Service: Endoscopy;;   BRONCHIAL WASHINGS  08/30/2019   Procedure: BRONCHIAL WASHINGS;  Surgeon: Candee Furbish, MD;  Location: [facility];  Service: Endoscopy;;   CARPAL TUNNEL RELEASE Bilateral 1989   right   CATARACT EXTRACTION, BILATERAL  2010   Dr Kathrin Penner   CHOLECYSTECTOMY  2006   COLONOSCOPY     Dr Ardis Hughs   EYE SURGERY Left    Bilateral eye (film removed lt eye 01/09/17)   LUMBAR LAMINECTOMY/DECOMPRESSION MICRODISCECTOMY Left 03/20/2017   Procedure: LEFT LUMBAR FOURLUMBAR FIVE LAMINOTOMY AND MICROICRODISCECTOMY 1 LEVEL;  Surgeon: Jovita Gamma, MD;  Location: [place];  Service: Neurosurgery;  Laterality: Left;  LEFT   NM MYOVIEW LTD  06/13/2008   low risk scan   ORTHOPEDIC SURGERY  1989/200/2002/2012   elbows,shoulder surgery x 3, right hand   PACEMAKER IMPLANT N/A 09/06/2019   Procedure: PACEMAKER IMPLANT;  Surgeon: Evans Lance, MD;  Location: [place] [facility];  Service: Cardiovascular;  Laterality: N/A;   RECTOCELE REPAIR  2016   TONSILLECTOMY  1957   US ECHOCARDIOGRAPHY  05/01/2010   trace MR,AI,TR;EF =>55%   VIDEO BRONCHOSCOPY Bilateral 07/17/2017   Procedure: VIDEO BRONCHOSCOPY WITHOUT FLUORO;  Surgeon: Rigoberto Noel, MD;  Location: [facility];  Service: Cardiopulmonary;  Laterality: Bilateral;   VIDEO BRONCHOSCOPY Bilateral 09/02/2017   Procedure: VIDEO BRONCHOSCOPY WITHOUT FLUORO;  Surgeon: Rigoberto Noel, MD;  Location: [facility];  Service: Cardiopulmonary;  Laterality: Bilateral;   VIDEO  BRONCHOSCOPY N/A 08/30/2019   Procedure: VIDEO BRONCHOSCOPY WITHOUT FLUORO;  Surgeon: Candee Furbish, MD;  Location: [facility];  Service: Endoscopy;  Laterality: N/A;     OB History     Gravida  3   Para      Term      Preterm      AB  2   Living  1      SAB  2   IAB      Ectopic      Multiple      Live Births              Family History  Problem Relation Age of Onset   Heart attack Father 43   Heart disease Father        Before age 22   Colon cancer Mother 21   Stroke Mother 89   Kidney disease Mother        ? renal calculi; S/P resecton of kidney   Cancer -  Colon Mother 54   Ovarian cancer Sister    Other Sister        Valve replacement (aortic & mitral ) in 2 sisters   Heart disease Sister        Before age 31   Heart disease Brother        aortic & mitral valve replacement in 2 bro; both had CBAG   Diabetes Neg Hx    Breast cancer Neg Hx     Social History   Tobacco Use   Smoking status: Never   Smokeless tobacco: Never  Vaping Use   Vaping Use: Never used  Substance Use Topics   Alcohol use: No    Alcohol/week: 0.0 standard drinks   Drug use: No    Home Medications Prior to Admission medications   Medication Sig Start Date End Date Taking? Authorizing Provider  feeding supplement (ENSURE ENLIVE / ENSURE PLUS) LIQD Take 237 mLs by mouth 2 (two) times daily between meals. 03/17/21  Yes Antonieta Pert, MD  apixaban (ELIQUIS) 2.5 MG TABS tablet Take 1 tablet (2.5 mg total) by mouth 2 (two) times daily. APPOINTMENT NEEDED FOR FURTHER REFILLS. CONTACT YOUR CARDIOLOGIST Patient taking differently: Take 2.5 mg by mouth 2 (two) times daily. 02/15/21   Croitoru, Mihai, MD  b complex vitamins tablet Take 1 tablet by mouth daily after supper.    [provider]  B Complex-C (B-COMPLEX WITH VITAMIN C) tablet Take 1 tablet by mouth daily after supper. 03/17/21   Antonieta Pert, MD  cholecalciferol (VITAMIN D3) 25 MCG (1000 UNIT) tablet Take 1,000  Units by mouth daily.    [provider]  cyclobenzaprine (FLEXERIL) 10 MG tablet Take 10 mg by mouth 2 (two) times daily as needed for muscle spasms.    [provider]  diclofenac sodium (VOLTAREN) 1 % GEL Apply 2-4 g topically 4 (four) times daily as needed (for pain).     [provider]  flecainide (TAMBOCOR) 50 MG tablet Take 1.5 tablets (75 mg total) by mouth 2 (two) times daily. TAKE 1 TABLET(50 MG) BY MOUTH EVERY 12 HOURS Patient taking differently: Take 75 mg by mouth 2 (two) times daily. 01/23/21   Baldwin Jamaica, PA-C  hydrALAZINE (APRESOLINE) 25 MG tablet Take 1 tablet (25 mg total) by mouth every 8 (eight) hours as needed (if systolic bp more than 443 mmhg). 09/19/20 09/19/21  Shelly Coss, MD  levothyroxine (SYNTHROID) 88 MCG tablet Take 88 mcg by mouth daily before breakfast.    [provider]  meclizine (ANTIVERT) 25 MG tablet Take 1 tablet (25 mg total) by mouth daily. 1/2 every 8 hrs prn for imbalance Patient taking differently: Take 12.5 mg by mouth 3 (three) times daily as needed (imbalance). 11/01/10   Hendricks Limes, MD  metoprolol succinate (TOPROL-XL) 100 MG 24 hr tablet Take 1 tablet (100 mg total) by mouth daily. Take with or immediately following a meal. 01/23/21 10/20/21  Baldwin Jamaica, PA-C  senna (SENOKOT) 8.6 MG tablet Take 1 tablet by mouth every other day.     [provider]  traMADol (ULTRAM) 50 MG tablet Take 0.5-1 tablets (25-50 mg total) by mouth 3 (three) times daily as needed for up to 4 doses for moderate pain. 03/19/21   Antonieta Pert, MD    Allergies    Aspirin, Lovastatin, Benazepril hcl, Febuxostat, Hydrocodone-acetaminophen, Paroxetine, Tape, Valacyclovir hcl, Bupropion hcl, Ezetimibe, Fenofibrate, and Pravastatin sodium  Review of Systems   Review of Systems  Skin:  Positive for wound.  All other systems reviewed and are negative.  Physical Exam Updated Vital Signs BP (!) 167/103   Pulse 66   Temp  (!) 97.5 F (36.4 C) (Oral)   Resp 14   Ht 5\' 5"  (1.651 m)   Wt 45.4 kg   SpO2 98%   BMI 16.64 kg/m   Physical Exam Vitals and nursing note reviewed.  Constitutional:      General: She is not in acute distress.    Appearance: Normal appearance.  HENT:     Head: Normocephalic.  Eyes:     General:        Right eye: No discharge.        Left eye: No discharge.  Cardiovascular:     Rate and Rhythm: Normal rate and regular rhythm.     Heart sounds: No murmur heard.   No friction rub. No gallop.  Pulmonary:     Effort: Pulmonary effort is normal.     Breath sounds: Normal breath sounds.  Abdominal:     General: Bowel sounds are normal.     Palpations: Abdomen is soft.  Skin:    General: Skin is warm and dry.     Capillary Refill: Capillary refill takes less than 2 seconds.     Comments: Approx 3cm stable hematoma on left temple. Intact temporal arterial pulse  Neurological:     Mental Status: She is alert and oriented to person, place, and time.     Comments: CN3-12 grossly intact, strength 5/5 bil UE/LE. Intact finger to nose. Aox4.  Psychiatric:        Mood and Affect: Mood normal.        Behavior: Behavior normal.    ED Results / Procedures / Treatments   Labs (all labs ordered are listed, but only abnormal results are displayed) Labs Reviewed  CBC WITH DIFFERENTIAL/PLATELET - Abnormal; Notable for the following components:      Result Value   RDW 15.9 (*)    All other components within normal limits  BASIC METABOLIC PANEL - Abnormal; Notable for the following components:   Glucose, Bld 114 (*)    BUN 33 (*)    Creatinine, Ser 2.83 (*)    GFR, Estimated 16 (*)    All other components within normal limits  URINALYSIS, ROUTINE W REFLEX MICROSCOPIC - Abnormal; Notable for the following components:   APPearance HAZY (*)    Hgb urine dipstick MODERATE (*)    Protein, ur 30 (*)    Leukocytes,Ua TRACE (*)    RBC / HPF >50 (*)    All other components within normal  limits  URINE CULTURE    EKG EKG Interpretation  Date/Time:  Saturday May 12 2021 07:55:51 EST Ventricular Rate:  67 PR Interval:  77 QRS Duration: 232 QT Interval:  595 QTC Calculation: 629 R Axis:   -80 Text Interpretation: Sinus rhythm Paired ventricular premature complexes Short PR interval Right bundle branch block LVH with IVCD and secondary repol abnrm Prolonged QT interval When compared to prior, now paced rhythm. No STEMI Confirmed by Antony Blackbird [phone]) on 05/12/2021 8:03:46 AM  Radiology CT Head Wo Contrast  Result Date: 05/12/2021 CLINICAL DATA:  80 year old female status post fall on Eliquis. Left forehead hematoma. EXAM: CT HEAD WITHOUT CONTRAST TECHNIQUE: Contiguous axial images were obtained from the base of the skull through the vertex without intravenous contrast. COMPARISON:  Brain MRI 06/30/2003.  Head CT 03/12/2021. FINDINGS: Brain: No midline shift, ventriculomegaly, mass effect,  evidence of mass lesion, intracranial hemorrhage or evidence of cortically based acute infarction. Patchy and confluent bilateral white matter hypodensity is stable. Stable vascular calcification in the left basal ganglia. Vascular: Calcified atherosclerosis at the skull base. No suspicious intracranial vascular hyperdensity. Skull: Stable and intact. Sinuses/Orbits: Visualized paranasal sinuses and mastoids are clear. Other: Left anterior convexity, supraorbital scalp contusion and hematoma on series 4, image 29 up to 5 mm in thickness. Underlying calvarium intact. Negative other orbit and scalp soft tissues. IMPRESSION: 1. Left scalp soft tissue injury without underlying skull fracture. 2. No acute intracranial abnormality. Stable chronic white matter disease. Electronically Signed   By: Genevie Ann M.D.   On: 05/12/2021 07:49    Procedures Procedures   Medications Ordered in ED Medications  traMADol (ULTRAM) tablet 25-50 mg (has no administration in time range)  flecainide (TAMBOCOR)  tablet 75 mg (has no administration in time range)  hydrALAZINE (APRESOLINE) tablet 25 mg (has no administration in time range)  metoprolol succinate (TOPROL-XL) 24 hr tablet 100 mg (has no administration in time range)  levothyroxine (SYNTHROID) tablet 88 mcg (has no administration in time range)  meclizine (ANTIVERT) tablet 12.5 mg (has no administration in time range)  senna (SENOKOT) tablet 8.6 mg (has no administration in time range)  apixaban (ELIQUIS) tablet 2.5 mg (has no administration in time range)  sodium chloride flush (NS) 0.9 % injection 3 mL (has no administration in time range)  lactated ringers infusion (has no administration in time range)  acetaminophen (TYLENOL) tablet 650 mg (has no administration in time range)    Or  acetaminophen (TYLENOL) suppository 650 mg (has no administration in time range)  docusate sodium (COLACE) capsule 100 mg (has no administration in time range)  polyethylene glycol (MIRALAX / GLYCOLAX) packet 17 g (has no administration in time range)  bisacodyl (DULCOLAX) EC tablet 5 mg (has no administration in time range)  ondansetron (ZOFRAN) tablet 4 mg (has no administration in time range)    Or  ondansetron (ZOFRAN) injection 4 mg (has no administration in time range)  oxyCODONE (Oxy IR/ROXICODONE) immediate release tablet 5 mg (has no administration in time range)  sodium chloride 0.9 % bolus 500 mL (0 mLs Intravenous Stopped 05/12/21 1000)    ED Course  I have reviewed the triage vital signs and the nursing notes.  Pertinent labs & imaging results that were available during my care of the patient were reviewed by me and considered in my medical decision making (see chart for details).    MDM Rules/Calculators/A&P                         I discussed this case with my attending physician who cosigned this note including patient's presenting symptoms, physical exam, and planned diagnostics and interventions. Attending physician stated agreement  with plan or made changes to plan which were implemented.   Attending physician assessed patient at bedside.  Overall well-appearing 80 year old female with significant past medical history presents after head injury this morning.  Head injury was not a fall, however she does have a hematoma overlying the left temple.  Patient denying all symptoms at this time, no lightheadedness, no dizziness, no chest pain, no shortness of breath, no recent health changes. CT Head wo does not show any acute abnormality at this time.  Creatinine elevated to 2.83 from 1.9 last month.  Concern for AKI.  We will start gentle fluid rehydration, as well as run a urine as  patient has had asymptomatic bacteriuria causing issues with her kidneys in the past.  Benign abdominal exam, no CVA tenderness, no suprapubic tenderness.  Urine without evidence of urinary tract infection today.  Daughter was some report that she is altered from baseline based on my description.  Patient is alert and oriented, however some confusing speech, possible early signs of dementia.  At this time in context of acute kidney injury will consult for admission.  Spoke with Dr. Lorin Mercy who accepts admission at this time.  Informed patient's daughter at this time. Final Clinical Impression(s) / ED Diagnoses Final diagnoses:  None    Rx / DC Orders ED Discharge Orders     None        Anselmo Pickler, PA-C 05/12/21 1213    Tegeler, Gwenyth Allegra, MD 05/13/21 [phone]

## 2021-05-12 NOTE — ED Notes (Signed)
Called for lunch tray

## 2021-05-12 NOTE — ED Notes (Signed)
ED TO INPATIENT HANDOFF REPORT  ED Nurse Name and Phone #: Baxter Flattery, [place]  S Name/Age/Gender Deborah Shields 80 y.o. female Room/Bed: 039C/039C  Code Status   Code Status: DNR  Home/SNF/Other Home Patient oriented to: self, place, time, and situation Is this baseline? Yes   Triage Complete: Triage complete  Chief Complaint AKI (acute kidney injury) ([place]) [N17.9]  Triage Note Pt bib EMS from [facility] for head injury; was "crawling" to the bathroom and bumped her head on the toilet. Pt is on eliquis. No LOC. Hematoma noted to L side of forehead. Not c/o pain at this time.  EMS vitals: 178/98 HR 84, A fib (hx) 97% room air    Allergies Allergies  Allergen Reactions   Penicillins Itching, Swelling and Other (See Comments)    REACTION: itching and edema   Aspirin Nausea Only and Other (See Comments)    REACTION: GI upset  ( pt can take 81 mg but NOT   325 mg ASA )   Lovastatin Nausea Only and Other (See Comments)    REACTION: nausea   Benazepril Hcl Other (See Comments)    Unknown   Colesevelam Hcl Other (See Comments)   Doxycycline Hyclate Other (See Comments)   Febuxostat     Other reaction(s): angioedema   Hydrocodone-Acetaminophen     Other reaction(s): irritable   Losartan    Paroxetine Other (See Comments)    Unknown   Statins Depletion [Acid Blockers Support] Other (See Comments)   Tape Other (See Comments)   Valacyclovir Hcl Nausea And Vomiting   Bupropion Hcl Other (See Comments)    REACTION: tinnitis   Ezetimibe Nausea And Vomiting and Other (See Comments)    REACTION: GI symptoms   Fenofibrate Other (See Comments)    Myalgia    Pravastatin Sodium Other (See Comments)    REACTION: elevated CPK - Muscle's in bilateral Leg    Level of Care/Admitting Diagnosis ED Disposition     ED Disposition  Admit   Condition  --   Comment  Hospital Area: [facility] [[id number]]  Level of Care: Telemetry Medical [[id number]]  May place  patient in observation at [facility] or [place] if equivalent level of care is available:: Yes  Covid Evaluation: Asymptomatic Screening Protocol (No Symptoms)  Diagnosis: AKI (acute kidney injury) [facility]) [[id number]]  Admitting Physician: Karmen Bongo [[id number]]  Attending Physician: Karmen Bongo [[id number]]          B Medical/Surgery History Past Medical History:  Diagnosis Date   Anxiety    Aortic insufficiency    mild due to degenerative changes   Arthritis    Basal cell carcinoma 2007   [facility] Left leg   Carotid artery occlusion    Chronic kidney disease    CRD Stage 3   Degenerative joint disease    Diverticulosis of colon    Heart murmur    Hyperlipidemia    NMR 07/2009: LDL 200 (2260/1233)TG 99, HDL 65. LDL goal =<120, ideally <100. father MI @ 24   Hypertension    Hypothyroidism    Dr Wilson Singer   Microscopic hematuria    Peripheral neuropathy    compressive in UE bilaterally; Dr Daylene Katayama   Peripheral vascular disease ([place])    ICA bilat, Dr.Charles fields, [facility]   Pneumonia    2017   PONV (postoperative nausea and vomiting)    "Inner ear, does  okay with Scopolamine"   Pulmonary embolus ([place])    Rectocele  [place] spotted fever    Past Surgical History:  Procedure Laterality Date   ABDOMINAL HYSTERECTOMY  1973   age 89 due to dysfuctional menses; HRT x 25-30 years   APPENDECTOMY  1952   basal cell cancer  03/2006   leg   BILATERAL OOPHORECTOMY  1990   prophylactically (sister had ovarian ca)   BRONCHIAL BRUSHINGS  08/30/2019   Procedure: BRONCHIAL BRUSHINGS;  Surgeon: Candee Furbish, MD;  Location: [facility];  Service: Endoscopy;;   BRONCHIAL WASHINGS  08/30/2019   Procedure: BRONCHIAL WASHINGS;  Surgeon: Candee Furbish, MD;  Location: [facility];  Service: Endoscopy;;   CARPAL TUNNEL RELEASE Bilateral 1989   right   CATARACT EXTRACTION, BILATERAL  2010   Dr Kathrin Penner   CHOLECYSTECTOMY  2006   COLONOSCOPY     Dr Ardis Hughs   EYE SURGERY  Left    Bilateral eye (film removed lt eye 01/09/17)   LUMBAR LAMINECTOMY/DECOMPRESSION MICRODISCECTOMY Left 03/20/2017   Procedure: LEFT LUMBAR FOURLUMBAR FIVE LAMINOTOMY AND MICROICRODISCECTOMY 1 LEVEL;  Surgeon: Jovita Gamma, MD;  Location: [place];  Service: Neurosurgery;  Laterality: Left;  LEFT   NM MYOVIEW LTD  06/13/2008   low risk scan   ORTHOPEDIC SURGERY  1989/200/2002/2012   elbows,shoulder surgery x 3, right hand   PACEMAKER IMPLANT N/A 09/06/2019   Procedure: PACEMAKER IMPLANT;  Surgeon: Evans Lance, MD;  Location: [facility];  Service: Cardiovascular;  Laterality: N/A;   RECTOCELE REPAIR  2016   TONSILLECTOMY  1957   US ECHOCARDIOGRAPHY  05/01/2010   trace MR,AI,TR;EF =>55%   VIDEO BRONCHOSCOPY Bilateral 07/17/2017   Procedure: VIDEO BRONCHOSCOPY WITHOUT FLUORO;  Surgeon: Rigoberto Noel, MD;  Location: [facility];  Service: Cardiopulmonary;  Laterality: Bilateral;   VIDEO BRONCHOSCOPY Bilateral 09/02/2017   Procedure: VIDEO BRONCHOSCOPY WITHOUT FLUORO;  Surgeon: Rigoberto Noel, MD;  Location: [facility];  Service: Cardiopulmonary;  Laterality: Bilateral;   VIDEO BRONCHOSCOPY N/A 08/30/2019   Procedure: VIDEO BRONCHOSCOPY WITHOUT FLUORO;  Surgeon: Candee Furbish, MD;  Location: [facility];  Service: Endoscopy;  Laterality: N/A;     A IV Location/Drains/Wounds Patient Lines/Drains/Airways Status     Active Line/Drains/Airways     Name Placement date Placement time Site Days   Peripheral IV 05/12/21 20 G Left Antecubital 05/12/21  0720  Antecubital  less than 1   External Urinary Catheter 03/12/21  2059  --  61   External Urinary Catheter 05/12/21  1250  --  less than 1   Pressure Injury 03/13/21 Coccyx Medial Stage 2 -  Partial thickness loss of dermis presenting as a shallow open injury with a red, pink wound bed without slough. 03/13/21  1913  -- 60            Intake/Output Last 24 hours  Intake/Output Summary (Last 24 hours) at 05/12/2021  1559 Last data filed at 05/12/2021 1000 Gross per 24 hour  Intake 500 ml  Output --  Net 500 ml    Labs/Imaging Results for orders placed or performed during the hospital encounter of 05/12/21 (from the past 48 hour(s))  CBC with Differential     Status: Abnormal   Collection Time: 05/12/21  7:14 AM  Result Value Ref Range   WBC 8.8 4.0 - 10.5 K/uL   RBC 4.37 3.87 - 5.11 MIL/uL   Hemoglobin 13.2 12.0 - 15.0 g/dL   HCT 41.7 36.0 - 46.0 %   MCV 95.4 80.0 - 100.0 fL   MCH 30.2  26.0 - 34.0 pg   MCHC 31.7 30.0 - 36.0 g/dL   RDW 15.9 (H) 11.5 - 15.5 %   Platelets 304 150 - 400 K/uL   nRBC 0.0 0.0 - 0.2 %   Neutrophils Relative % 71 %   Neutro Abs 6.3 1.7 - 7.7 K/uL   Lymphocytes Relative 15 %   Lymphs Abs 1.3 0.7 - 4.0 K/uL   Monocytes Relative 9 %   Monocytes Absolute 0.8 0.1 - 1.0 K/uL   Eosinophils Relative 3 %   Eosinophils Absolute 0.3 0.0 - 0.5 K/uL   Basophils Relative 1 %   Basophils Absolute 0.1 0.0 - 0.1 K/uL   Immature Granulocytes 1 %   Abs Immature Granulocytes 0.05 0.00 - 0.07 K/uL    Comment: Performed at [facility] [address], [place], [place] [address]  Basic metabolic panel     Status: Abnormal   Collection Time: 05/12/21  7:14 AM  Result Value Ref Range   Sodium 138 135 - 145 mmol/L   Potassium 3.9 3.5 - 5.1 mmol/L   Chloride 100 98 - 111 mmol/L   CO2 27 22 - 32 mmol/L   Glucose, Bld 114 (H) 70 - 99 mg/dL    Comment: Glucose reference range applies only to samples taken after fasting for at least 8 hours.   BUN 33 (H) 8 - 23 mg/dL   Creatinine, Ser 2.83 (H) 0.44 - 1.00 mg/dL   Calcium 9.3 8.9 - 10.3 mg/dL   GFR, Estimated 16 (L) >60 mL/min    Comment: (NOTE) Calculated using the CKD-EPI Creatinine Equation (2021)    Anion gap 11 5 - 15    Comment: Performed at [facility] [address], [place], [place] [address]  Urinalysis, Routine w reflex microscopic Urine, In & Out Cath     Status: Abnormal   Collection Time: 05/12/21   8:39 AM  Result Value Ref Range   Color, Urine YELLOW YELLOW   APPearance HAZY (A) CLEAR   Specific Gravity, Urine 1.013 1.005 - 1.030   pH 6.0 5.0 - 8.0   Glucose, UA NEGATIVE NEGATIVE mg/dL   Hgb urine dipstick MODERATE (A) NEGATIVE   Bilirubin Urine NEGATIVE NEGATIVE   Ketones, ur NEGATIVE NEGATIVE mg/dL   Protein, ur 30 (A) NEGATIVE mg/dL   Nitrite NEGATIVE NEGATIVE   Leukocytes,Ua TRACE (A) NEGATIVE   RBC / HPF >50 (H) 0 - 5 RBC/hpf   WBC, UA 11-20 0 - 5 WBC/hpf   Bacteria, UA NONE SEEN NONE SEEN   Mucus PRESENT     Comment: Performed at [facility] [address] [place], [place] [address]  Resp Panel by RT-PCR (Flu A&B, Covid) Nasopharyngeal Swab     Status: None   Collection Time: 05/12/21  1:31 PM   Specimen: Nasopharyngeal Swab; Nasopharyngeal(NP) swabs in vial transport medium  Result Value Ref Range   SARS Coronavirus 2 by RT PCR NEGATIVE NEGATIVE    Comment: (NOTE) SARS-CoV-2 target nucleic acids are NOT DETECTED.  The SARS-CoV-2 RNA is generally detectable in upper respiratory specimens during the acute phase of infection. The lowest concentration of SARS-CoV-2 viral copies this assay can detect is 138 copies/mL. A negative result does not preclude SARS-Cov-2 infection and should not be used as the sole basis for treatment or other patient management decisions. A negative result may occur with  improper specimen collection/handling, submission of specimen other than nasopharyngeal swab, presence of viral mutation(s) within the areas  targeted by this assay, and inadequate number of viral copies(<138 copies/mL). A negative result must be combined with clinical observations, patient history, and epidemiological information. The expected result is Negative.  Fact Sheet for Patients:  EntrepreneurPulse.com.au  Fact Sheet for Healthcare Providers:  IncredibleEmployment.be  This test is no t yet approved or cleared by the  [place] FDA and  has been authorized for detection and/or diagnosis of SARS-CoV-2 by FDA under an Emergency Use Authorization (EUA). This EUA will remain  in effect (meaning this test can be used) for the duration of the COVID-19 declaration under Section 564(b)(1) of the Act, 21 U.S.C.section 360bbb-3(b)(1), unless the authorization is terminated  or revoked sooner.       Influenza A by PCR NEGATIVE NEGATIVE   Influenza B by PCR NEGATIVE NEGATIVE    Comment: (NOTE) The Xpert Xpress SARS-CoV-2/FLU/RSV plus assay is intended as an aid in the diagnosis of influenza from Nasopharyngeal swab specimens and should not be used as a sole basis for treatment. Nasal washings and aspirates are unacceptable for Xpert Xpress SARS-CoV-2/FLU/RSV testing.  Fact Sheet for Patients: EntrepreneurPulse.com.au  Fact Sheet for Healthcare Providers: IncredibleEmployment.be  This test is not yet approved or cleared by the [place] FDA and has been authorized for detection and/or diagnosis of SARS-CoV-2 by FDA under an Emergency Use Authorization (EUA). This EUA will remain in effect (meaning this test can be used) for the duration of the COVID-19 declaration under Section 564(b)(1) of the Act, 21 U.S.C. section 360bbb-3(b)(1), unless the authorization is terminated or revoked.  Performed at [facility], [place] [address], [place], [place] [address]    CT Head Wo Contrast  Result Date: 05/12/2021 CLINICAL DATA:  80 year old female status post fall on Eliquis. Left forehead hematoma. EXAM: CT HEAD WITHOUT CONTRAST TECHNIQUE: Contiguous axial images were obtained from the base of the skull through the vertex without intravenous contrast. COMPARISON:  Brain MRI 06/30/2003.  Head CT 03/12/2021. FINDINGS: Brain: No midline shift, ventriculomegaly, mass effect, evidence of mass lesion, intracranial hemorrhage or evidence of cortically based acute infarction.  Patchy and confluent bilateral white matter hypodensity is stable. Stable vascular calcification in the left basal ganglia. Vascular: Calcified atherosclerosis at the skull base. No suspicious intracranial vascular hyperdensity. Skull: Stable and intact. Sinuses/Orbits: Visualized paranasal sinuses and mastoids are clear. Other: Left anterior convexity, supraorbital scalp contusion and hematoma on series 4, image 29 up to 5 mm in thickness. Underlying calvarium intact. Negative other orbit and scalp soft tissues. IMPRESSION: 1. Left scalp soft tissue injury without underlying skull fracture. 2. No acute intracranial abnormality. Stable chronic white matter disease. Electronically Signed   By: Genevie Ann M.D.   On: 05/12/2021 07:49    Pending Labs Unresulted Labs (From admission, onward)     Start     Ordered   05/13/21 8338  Basic metabolic panel  Tomorrow morning,   R        05/12/21 1157   05/13/21 0500  CBC  Tomorrow morning,   R        05/12/21 1157   05/12/21 1103  Urine Culture  Once,   STAT       Question:  Indication  Answer:  Altered mental status (if no other cause identified)   05/12/21 1102            Vitals/Pain Today's Vitals   05/12/21 1530 05/12/21 1540 05/12/21 1550 05/12/21 1555  BP:   (!) 141/75   Pulse: 64 61 63  60  Resp: 12 14 16 13   Temp:      TempSrc:      SpO2: 93% 95% 96% 96%  Weight:      Height:      PainSc:        Isolation Precautions No active isolations  Medications Medications  traMADol (ULTRAM) tablet 25-50 mg (has no administration in time range)  flecainide (TAMBOCOR) tablet 75 mg (has no administration in time range)  hydrALAZINE (APRESOLINE) tablet 25 mg (has no administration in time range)  metoprolol succinate (TOPROL-XL) 24 hr tablet 100 mg (has no administration in time range)  levothyroxine (SYNTHROID) tablet 88 mcg (has no administration in time range)  meclizine (ANTIVERT) tablet 12.5 mg (has no administration in time range)   senna (SENOKOT) tablet 8.6 mg (has no administration in time range)  apixaban (ELIQUIS) tablet 2.5 mg (has no administration in time range)  sodium chloride flush (NS) 0.9 % injection 3 mL (has no administration in time range)  lactated ringers infusion (has no administration in time range)  acetaminophen (TYLENOL) tablet 650 mg (has no administration in time range)    Or  acetaminophen (TYLENOL) suppository 650 mg (has no administration in time range)  docusate sodium (COLACE) capsule 100 mg (has no administration in time range)  polyethylene glycol (MIRALAX / GLYCOLAX) packet 17 g (has no administration in time range)  bisacodyl (DULCOLAX) EC tablet 5 mg (has no administration in time range)  ondansetron (ZOFRAN) tablet 4 mg (has no administration in time range)    Or  ondansetron (ZOFRAN) injection 4 mg (has no administration in time range)  oxyCODONE (Oxy IR/ROXICODONE) immediate release tablet 5 mg (has no administration in time range)  sodium chloride 0.9 % bolus 500 mL (0 mLs Intravenous Stopped 05/12/21 1000)    Mobility walks with device Low fall risk   Focused Assessments Cardiac Assessment Handoff:    Lab Results  Component Value Date   CKTOTAL 120 03/13/2021   TROPONINI 0.01 12/31/2013   Lab Results  Component Value Date   DDIMER 3.69 (H) 03/13/2021   Does the Patient currently have chest pain? No    R Recommendations: See Admitting Provider Note  Report given to:   Additional Notes:

## 2021-05-13 ENCOUNTER — Observation Stay (HOSPITAL_COMMUNITY): Payer: Medicare Other

## 2021-05-13 DIAGNOSIS — W1830XA Fall on same level, unspecified, initial encounter: Secondary | ICD-10-CM | POA: Diagnosis present

## 2021-05-13 DIAGNOSIS — R4182 Altered mental status, unspecified: Secondary | ICD-10-CM | POA: Diagnosis not present

## 2021-05-13 DIAGNOSIS — I10 Essential (primary) hypertension: Secondary | ICD-10-CM | POA: Diagnosis not present

## 2021-05-13 DIAGNOSIS — Z950 Presence of cardiac pacemaker: Secondary | ICD-10-CM | POA: Diagnosis not present

## 2021-05-13 DIAGNOSIS — N179 Acute kidney failure, unspecified: Principal | ICD-10-CM

## 2021-05-13 DIAGNOSIS — R443 Hallucinations, unspecified: Secondary | ICD-10-CM | POA: Diagnosis not present

## 2021-05-13 DIAGNOSIS — I517 Cardiomegaly: Secondary | ICD-10-CM | POA: Diagnosis not present

## 2021-05-13 DIAGNOSIS — F0392 Unspecified dementia, unspecified severity, with psychotic disturbance: Secondary | ICD-10-CM | POA: Diagnosis present

## 2021-05-13 DIAGNOSIS — I129 Hypertensive chronic kidney disease with stage 1 through stage 4 chronic kidney disease, or unspecified chronic kidney disease: Secondary | ICD-10-CM | POA: Diagnosis not present

## 2021-05-13 DIAGNOSIS — Z823 Family history of stroke: Secondary | ICD-10-CM | POA: Diagnosis not present

## 2021-05-13 DIAGNOSIS — T82119A Breakdown (mechanical) of unspecified cardiac electronic device, initial encounter: Secondary | ICD-10-CM | POA: Diagnosis not present

## 2021-05-13 DIAGNOSIS — Z7901 Long term (current) use of anticoagulants: Secondary | ICD-10-CM | POA: Diagnosis not present

## 2021-05-13 DIAGNOSIS — E039 Hypothyroidism, unspecified: Secondary | ICD-10-CM

## 2021-05-13 DIAGNOSIS — Z8041 Family history of malignant neoplasm of ovary: Secondary | ICD-10-CM | POA: Diagnosis not present

## 2021-05-13 DIAGNOSIS — I119 Hypertensive heart disease without heart failure: Secondary | ICD-10-CM | POA: Diagnosis not present

## 2021-05-13 DIAGNOSIS — E860 Dehydration: Secondary | ICD-10-CM | POA: Diagnosis not present

## 2021-05-13 DIAGNOSIS — Y710 Diagnostic and monitoring cardiovascular devices associated with adverse incidents: Secondary | ICD-10-CM | POA: Diagnosis present

## 2021-05-13 DIAGNOSIS — N184 Chronic kidney disease, stage 4 (severe): Secondary | ICD-10-CM

## 2021-05-13 DIAGNOSIS — I495 Sick sinus syndrome: Secondary | ICD-10-CM | POA: Diagnosis not present

## 2021-05-13 DIAGNOSIS — I482 Chronic atrial fibrillation, unspecified: Secondary | ICD-10-CM

## 2021-05-13 DIAGNOSIS — Z20822 Contact with and (suspected) exposure to covid-19: Secondary | ICD-10-CM | POA: Diagnosis not present

## 2021-05-13 DIAGNOSIS — Z79890 Hormone replacement therapy (postmenopausal): Secondary | ICD-10-CM | POA: Diagnosis not present

## 2021-05-13 DIAGNOSIS — Z86711 Personal history of pulmonary embolism: Secondary | ICD-10-CM | POA: Diagnosis not present

## 2021-05-13 DIAGNOSIS — E785 Hyperlipidemia, unspecified: Secondary | ICD-10-CM

## 2021-05-13 DIAGNOSIS — Z85828 Personal history of other malignant neoplasm of skin: Secondary | ICD-10-CM | POA: Diagnosis not present

## 2021-05-13 DIAGNOSIS — F03911 Unspecified dementia, unspecified severity, with agitation: Secondary | ICD-10-CM | POA: Diagnosis not present

## 2021-05-13 DIAGNOSIS — F0280 Dementia in other diseases classified elsewhere without behavioral disturbance: Secondary | ICD-10-CM | POA: Diagnosis not present

## 2021-05-13 DIAGNOSIS — Z90710 Acquired absence of both cervix and uterus: Secondary | ICD-10-CM | POA: Diagnosis not present

## 2021-05-13 DIAGNOSIS — R9431 Abnormal electrocardiogram [ECG] [EKG]: Secondary | ICD-10-CM

## 2021-05-13 DIAGNOSIS — I739 Peripheral vascular disease, unspecified: Secondary | ICD-10-CM | POA: Diagnosis not present

## 2021-05-13 DIAGNOSIS — Z8249 Family history of ischemic heart disease and other diseases of the circulatory system: Secondary | ICD-10-CM | POA: Diagnosis not present

## 2021-05-13 DIAGNOSIS — Z800 Family history of malignant neoplasm of digestive organs: Secondary | ICD-10-CM | POA: Diagnosis not present

## 2021-05-13 DIAGNOSIS — R531 Weakness: Secondary | ICD-10-CM | POA: Diagnosis not present

## 2021-05-13 DIAGNOSIS — Z66 Do not resuscitate: Secondary | ICD-10-CM | POA: Diagnosis not present

## 2021-05-13 DIAGNOSIS — Z7401 Bed confinement status: Secondary | ICD-10-CM | POA: Diagnosis not present

## 2021-05-13 LAB — URINE CULTURE: Culture: NO GROWTH

## 2021-05-13 LAB — BASIC METABOLIC PANEL
Anion gap: 8 (ref 5–15)
BUN: 31 mg/dL — ABNORMAL HIGH (ref 8–23)
CO2: 26 mmol/L (ref 22–32)
Calcium: 8.9 mg/dL (ref 8.9–10.3)
Chloride: 104 mmol/L (ref 98–111)
Creatinine, Ser: 2.46 mg/dL — ABNORMAL HIGH (ref 0.44–1.00)
GFR, Estimated: 19 mL/min — ABNORMAL LOW (ref >60)
Glucose, Bld: 118 mg/dL — ABNORMAL HIGH (ref 70–99)
Potassium: 3.8 mmol/L (ref 3.5–5.1)
Sodium: 138 mmol/L (ref 135–145)

## 2021-05-13 LAB — TSH: TSH: 14.148 u[IU]/mL — ABNORMAL HIGH (ref 0.350–4.500)

## 2021-05-13 LAB — CBC
HCT: 38.4 % (ref 36.0–46.0)
Hemoglobin: 12.2 g/dL (ref 12.0–15.0)
MCH: 29.9 pg (ref 26.0–34.0)
MCHC: 31.8 g/dL (ref 30.0–36.0)
MCV: 94.1 fL (ref 80.0–100.0)
Platelets: 269 10*3/uL (ref 150–400)
RBC: 4.08 MIL/uL (ref 3.87–5.11)
RDW: 15.9 % — ABNORMAL HIGH (ref 11.5–15.5)
WBC: 9.6 10*3/uL (ref 4.0–10.5)
nRBC: 0 % (ref 0.0–0.2)

## 2021-05-13 LAB — VITAMIN B12: Vitamin B-12: 686 pg/mL (ref 180–914)

## 2021-05-13 LAB — MAGNESIUM: Magnesium: 2.2 mg/dL (ref 1.7–2.4)

## 2021-05-13 LAB — FOLATE: Folate: 21.4 ng/mL (ref >5.9)

## 2021-05-13 MED ORDER — LACTATED RINGERS IV SOLN: INTRAVENOUS | Status: DC

## 2021-05-13 MED ORDER — HALOPERIDOL LACTATE 5 MG/ML IJ SOLN
2.0000 mg | Freq: Once | INTRAMUSCULAR | Status: AC
  Administered 2021-05-13: 2 mg via INTRAVENOUS
  Filled 2021-05-13: qty 1

## 2021-05-13 MED ORDER — LACTATED RINGERS IV SOLN: INTRAVENOUS | Status: AC

## 2021-05-13 MED ORDER — LEVOTHYROXINE SODIUM 100 MCG PO TABS
100.0000 ug | ORAL_TABLET | Freq: Every day | ORAL | Status: DC
  Administered 2021-05-14 – 2021-05-16 (×3): 100 ug via ORAL
  Filled 2021-05-13 (×3): qty 1

## 2021-05-13 MED ORDER — POTASSIUM CHLORIDE CRYS ER 10 MEQ PO TBCR
20.0000 meq | EXTENDED_RELEASE_TABLET | Freq: Once | ORAL | Status: AC
  Administered 2021-05-13: 20 meq via ORAL
  Filled 2021-05-13: qty 2

## 2021-05-13 MED ORDER — VALPROATE SODIUM 100 MG/ML IV SOLN
125.0000 mg | Freq: Three times a day (TID) | INTRAVENOUS | Status: DC | PRN
  Filled 2021-05-13: qty 1.25

## 2021-05-13 NOTE — Progress Notes (Signed)
Patient still unable to urinate on her own. Bladder scan showed 292 ml. Made provider on call aware. X1 in and out cath has been ordered and done. 450 ml of yellow urine obtained.

## 2021-05-13 NOTE — Progress Notes (Signed)
Mobility Specialist Progress Note:   05/13/21 0905  Mobility  Activity Ambulated in room  Level of Assistance Minimal assist, patient does 75% or more  Assistive Device Front wheel walker  Distance Ambulated (ft) 45 ft  Mobility Ambulated with assistance in room  Mobility Response Tolerated well  Mobility performed by Mobility specialist  $Mobility charge 1 Mobility   Pt indpendent with bed mobility. Required minA stand + cues for hand placement. Pt c/o generalized weakness, likely d/t not getting OOB since Friday. Pt left in bed with bed alarm on, MD present.  Nelta Numbers Mobility Specialist  Phone [phone]

## 2021-05-13 NOTE — Progress Notes (Addendum)
PROGRESS NOTE    Deborah Shields  NWG:[id number] DOB: [birth year] DOA: 05/12/2021 PCP: Lajean Manes, MD   Chief Complaint  Patient presents with   Head Injury    Brief Narrative:  Patient pleasant 80 year old female history of CKD stage III, hypertension, hyperlipidemia, hypothyroidism, pacemaker placement, A. fib on Eliquis, PVD and PE presented with altered mental status, acute kidney injury concerning for dehydration.  Patient noted to have a hematoma on the head with a negative head CT.  Patient noted to have a creatinine of 2.8 previously noted at 1.4 and daughter had reported recent confusion to admitting physician.  Daughter had requested evaluation for capacity.  Patient admitted placed on IV fluids, supportive care.   Assessment & Plan:   Principal Problem:   AKI (acute kidney injury) ([facility]) Active Problems:   Hypothyroidism (acquired)   Hyperlipidemia with target LDL less than 100   Stage 4 chronic kidney disease ([facility])   Hypertension, accelerated with heart disease, without CHF   Chronic a-fib ([facility])   QT prolongation  #1 acute kidney injury on chronic kidney disease stage IV -Patient presented with acute kidney injury with a creatinine of 2.8 on presentation from a baseline of approximately 1.4. -Likely secondary to prerenal azotemia secondary to dehydration. -Urinalysis done with trace leukocytes, nitrite negative, no bacteria seen, 11-20 WBCs. -Renal function trending down with hydration. -Continue IV fluids. -Supportive care.  2.  Dehydration IV fluids.  3.  Hypothyroidism -Check a TSH. -Continue current dose Synthroid.  4.  Superficial hematoma on the head -CT head negative. -No further work-up needed.  5.  Possible dementia with possible delusional behavior -Patient alert and oriented x3 but noted per admitting physician to say things that was not apparently accurate. -Check a TSH, vitamin B12, folate levels, RPR. -Per admitting physician  daughter confirmed and concerned about capacity for patient to make her own decisions in terms of refusal for SNF placement as it was felt daughter felt patient unable to take care of herself. -Psychiatry consulted at family's request and is noted per psychiatry that patient does not have capacity to refuse SNF at this time and recommending IV Depakote as needed agitation until QTC prolongation is resolved with close monitoring of LFTs. -  6.  QTC prolongation -Repeat EKG. -Continue hydration with IV fluids, replete electrolytes to keep potassium approximately 4, magnesium approximately 2. -Discontinue flecainide, discontinue Zofran. -Follow.  7.  Atrial fibrillation -Continue Toprol-XL for rate control. -Discontinue flecainide due to QTC prolongation -Eliquis for anticoagulation for now may need to reassess due to history of falls and unsteadiness.  8.  Hypertension -Continue hydralazine, Toprol-XL.  9.  Hypothyroidism -Check a TSH. -Synthroid.    DVT prophylaxis: Eliquis Code Status: DNR Family Communication: Updated patient.  Tried calling daughter however went straight to voicemail. Disposition:   Status is: Inpatient  The patient will require care spanning > 2 midnights and should be moved to inpatient because: Severity of illness      Consultants:  Psychiatry   Procedures:  CT head without contrast 05/12/2021 Chest x-ray 05/13/2021  Antimicrobials:  None   Subjective: Patient sitting up on the side of the bed nurse tech at bedside about to help patient go to the bathroom.  Patient denies any chest pain, no shortness of breath, no abdominal pain.  Patient alert oriented to self place and time.  Knows the president is Biden and the previous president was Trump.  Patient stating at nursing facility when she got up to go to  the bathroom was feeling pretty weak and as such decided to crawl to the bathroom, patient denies fall stated when she turned she hit her head on  the side of the bathtub.  Patient states she wants to go home as her daughter recently had surgery and would like to see her.  Patient states daughter lives close by.  Patient states was to be discharged from skilled nursing facility but They are due to recurrent UTIs.  Denies any dysuria.  Overall feeling better.  Objective: Vitals:   05/12/21 2349 05/13/21 0442 05/13/21 0759 05/13/21 1554  BP: (!) 172/105 (!) 158/98 (!) 142/85 129/87  Pulse: 89 (!) 56 (!) 59 84  Resp: 16 16 16 18   Temp: 97.9 F (36.6 C) 98.2 F (36.8 C) 97.7 F (36.5 C) (!) 97.5 F (36.4 C)  TempSrc: Oral Oral Oral Oral  SpO2: 98% 98% 100% 100%  Weight:      Height:        Intake/Output Summary (Last 24 hours) at 05/13/2021 1727 Last data filed at 05/13/2021 0550 Gross per 24 hour  Intake 1200.18 ml  Output 450 ml  Net 750.18 ml   Filed Weights   05/12/21 0703  Weight: 45.4 kg    Examination:  General exam: Appears calm and comfortable.  Frail.  Bruising noted on left scalp region. Respiratory system: Clear to auscultation bilaterally, no wheezes, no crackles, no rhonchi.  Normal respiratory effort.  Speaking in full sentences. Cardiovascular system: S1 & S2 heard, RRR. No JVD, murmurs, rubs, gallops or clicks. No pedal edema. Gastrointestinal system: Abdomen is nondistended, soft and nontender. No organomegaly or masses felt. Normal bowel sounds heard. Central nervous system: Alert and oriented x3. No focal neurological deficits. Extremities: Symmetric 5 x 5 power. Skin: No rashes, lesions or ulcers Psychiatry: Judgement and insight appear fair. Mood & affect appropriate.     Data Reviewed: I have personally reviewed following labs and imaging studies  CBC: Recent Labs  Lab 05/12/21 0714 05/13/21 0707  WBC 8.8 9.6  NEUTROABS 6.3  --   HGB 13.2 12.2  HCT 41.7 38.4  MCV 95.4 94.1  PLT 304 841    Basic Metabolic Panel: Recent Labs  Lab 05/12/21 0714 05/13/21 0707  NA 138 138  K 3.9 3.8   CL 100 104  CO2 27 26  GLUCOSE 114* 118*  BUN 33* 31*  CREATININE 2.83* 2.46*  CALCIUM 9.3 8.9    GFR: Estimated Creatinine Clearance: 12.6 mL/min (A) (by C-G formula based on SCr of 2.46 mg/dL (H)).  Liver Function Tests: No results for input(s): AST, ALT, ALKPHOS, BILITOT, PROT, ALBUMIN in the last 168 hours.  CBG: No results for input(s): GLUCAP in the last 168 hours.   Recent Results (from the past 240 hour(s))  Urine Culture     Status: None   Collection Time: 05/12/21 10:20 AM   Specimen: In/Out Cath Urine  Result Value Ref Range Status   Specimen Description IN/OUT CATH URINE  Final   Special Requests NONE  Final   Culture   Final    NO GROWTH Performed at [facility], [address] [place], [place] [address]    Report Status 05/13/2021 FINAL  Final  Resp Panel by RT-PCR (Flu A&B, Covid) Nasopharyngeal Swab     Status: None   Collection Time: 05/12/21  1:31 PM   Specimen: Nasopharyngeal Swab; Nasopharyngeal(NP) swabs in vial transport medium  Result Value Ref Range Status   SARS Coronavirus 2 by  RT PCR NEGATIVE NEGATIVE Final    Comment: (NOTE) SARS-CoV-2 target nucleic acids are NOT DETECTED.  The SARS-CoV-2 RNA is generally detectable in upper respiratory specimens during the acute phase of infection. The lowest concentration of SARS-CoV-2 viral copies this assay can detect is 138 copies/mL. A negative result does not preclude SARS-Cov-2 infection and should not be used as the sole basis for treatment or other patient management decisions. A negative result may occur with  improper specimen collection/handling, submission of specimen other than nasopharyngeal swab, presence of viral mutation(s) within the areas targeted by this assay, and inadequate number of viral copies(<138 copies/mL). A negative result must be combined with clinical observations, patient history, and epidemiological information. The expected result is Negative.  Fact Sheet  for Patients:  EntrepreneurPulse.com.au  Fact Sheet for Healthcare Providers:  IncredibleEmployment.be  This test is no t yet approved or cleared by the [place] FDA and  has been authorized for detection and/or diagnosis of SARS-CoV-2 by FDA under an Emergency Use Authorization (EUA). This EUA will remain  in effect (meaning this test can be used) for the duration of the COVID-19 declaration under Section 564(b)(1) of the Act, 21 U.S.C.section 360bbb-3(b)(1), unless the authorization is terminated  or revoked sooner.       Influenza A by PCR NEGATIVE NEGATIVE Final   Influenza B by PCR NEGATIVE NEGATIVE Final    Comment: (NOTE) The Xpert Xpress SARS-CoV-2/FLU/RSV plus assay is intended as an aid in the diagnosis of influenza from Nasopharyngeal swab specimens and should not be used as a sole basis for treatment. Nasal washings and aspirates are unacceptable for Xpert Xpress SARS-CoV-2/FLU/RSV testing.  Fact Sheet for Patients: EntrepreneurPulse.com.au  Fact Sheet for Healthcare Providers: IncredibleEmployment.be  This test is not yet approved or cleared by the [place] FDA and has been authorized for detection and/or diagnosis of SARS-CoV-2 by FDA under an Emergency Use Authorization (EUA). This EUA will remain in effect (meaning this test can be used) for the duration of the COVID-19 declaration under Section 564(b)(1) of the Act, 21 U.S.C. section 360bbb-3(b)(1), unless the authorization is terminated or revoked.  Performed at [facility], [place] [address], [place], [place] [address]          Radiology Studies: CT Head Wo Contrast  Result Date: 05/12/2021 CLINICAL DATA:  80 year old female status post fall on Eliquis. Left forehead hematoma. EXAM: CT HEAD WITHOUT CONTRAST TECHNIQUE: Contiguous axial images were obtained from the base of the skull through the vertex without  intravenous contrast. COMPARISON:  Brain MRI 06/30/2003.  Head CT 03/12/2021. FINDINGS: Brain: No midline shift, ventriculomegaly, mass effect, evidence of mass lesion, intracranial hemorrhage or evidence of cortically based acute infarction. Patchy and confluent bilateral white matter hypodensity is stable. Stable vascular calcification in the left basal ganglia. Vascular: Calcified atherosclerosis at the skull base. No suspicious intracranial vascular hyperdensity. Skull: Stable and intact. Sinuses/Orbits: Visualized paranasal sinuses and mastoids are clear. Other: Left anterior convexity, supraorbital scalp contusion and hematoma on series 4, image 29 up to 5 mm in thickness. Underlying calvarium intact. Negative other orbit and scalp soft tissues. IMPRESSION: 1. Left scalp soft tissue injury without underlying skull fracture. 2. No acute intracranial abnormality. Stable chronic white matter disease. Electronically Signed   By: Genevie Ann M.D.   On: 05/12/2021 07:49   DG CHEST PORT 1 VIEW  Result Date: 05/13/2021 CLINICAL DATA:  Altered mental status EXAM: PORTABLE CHEST 1 VIEW COMPARISON:  03/12/2021 FINDINGS: Cardiomegaly with left chest  multi lead pacer. Unchanged biapical pleuroparenchymal scarring. No acute airspace opacity. The visualized skeletal structures are unremarkable. IMPRESSION: Cardiomegaly. No acute abnormality of the lungs in AP portable projection. Electronically Signed   By: Delanna Ahmadi M.D.   On: 05/13/2021 09:18        Scheduled Meds:  apixaban  2.5 mg Oral BID   docusate sodium  100 mg Oral BID   flecainide  75 mg Oral BID   levothyroxine  88 mcg Oral QAC breakfast   metoprolol succinate  100 mg Oral Daily   senna  1 tablet Oral QODAY   sodium chloride flush  3 mL Intravenous Q12H   Continuous Infusions:  lactated ringers 100 mL/hr at 05/13/21 1008   valproate sodium       LOS: 0 days    Time spent: 35 minutes    Irine Seal, MD [facility]   To  contact the attending provider between 7A-7P or the covering provider during after hours 7P-7A, please log into the web site [email or web address] and access using universal [place] password for that web site. If you do not have the password, please call the hospital operator.  05/13/2021, 5:27 PM

## 2021-05-13 NOTE — Consult Note (Signed)
Cardiology Consultation:   Patient ID: Deborah Shields MRN: [id number]; DOB: 02/09/1941  Admit date: 05/12/2021 Date of Consult: 05/13/2021  Primary Care Provider: Lajean Manes, MD [facility] Cardiologist: Sanda Klein, MD  [facility] Electrophysiologist: Cristopher Peru, MD  Patient Profile:   Deborah Shields is a 80 y.o. female with pAF, CKD3, HTN, HLD, hypothyroidism, prior PE and dementia who presented with L head contusion who is being seen today for the evaluation of abnormal ECG at the request of Dr. Irine Seal [facility]).  History of Present Illness:   Deborah Shields was brought in by EMS from [place] after being found near the toilet with a hematoma on the left side of her forehead.  Initial EMS vitals with hypertension (BP 178/98), rate controlled AF (HR 84) and 97% on RA.  She had reported that she did not feel steady on her feet and usually has assistance with her walker for ambulation but had to go to the rest room and it was too far away. She bumped her head on the toilet when she was trying to get up on it without LOC.  She is on apixaban for stroke risk reduction with AF.  She has had previous admissions for similar presentations when she was dehydrated with concomitant UTI.  He had negative for a ICP and UA without significant evidence of UTI.  She did have AKI (sCr 2.8 from 1.4) and was thought to be hypovolemic.  Per daughter's report her dementia has been worsening over the past few years.  She was last seen by Dr. Crissie Sickles on September 07, 2019 with recurrent AF in the setting of hypertension and hemoptysis.  She was found to have posttermination pauses of greater than 3 seconds (6-7 seconds).  She had not had symptomatic AF recurrence since 2012 following shoulder surgery and was not on anticoagulation at that time.  After she was evaluated for hemoptysis (PNA c/b hemoptysis s/p bronch) she was started on apixaban 2.5 mg twice daily.  She  was having pauses while on diltiazem and eventually was started on flecainide (50 mg bid) on September 01, 2019 because she had issues tolerating IV amiodarone with heart rates dropping into the 30s.  She underwent SJM DC PPM (09/06/19) for SSS.  She was continued on flecainide 50 mg twice daily and Toprol-XL 25 mg daily (increased to 50 mg daily) along with apixaban (CHA2DS2-VASc 5) at that time.   [name] was then evaluated by Dr. Ellyn Hack in March 2022 for chest pressure with radiation to her left axilla with negative troponins and no ischemic changes on ECG.  All of her symptoms resolved with blood pressure management and it was plan for outpatient Myoview stress test which she did not complete.  On January 22, 2021 she had an alert for high ventricular rates with EGM's consistent with AF/RVR with periods of one-to-one conduction.  There was concern for AFL with one-to-one conduction and her metoprolol succinate was increased to 100 mg daily and her losartan was discontinued. Dr. Sallyanne Kuster had recommended considering stopping flecainide and potentially switching to amiodarone versus cardioversion in the ED if she was symptomatic but deferred to Dr. Lovena Le who is her primary EP.  She was evaluated on January 23, 2021 and had poor p.o. intake, weakness, and palpitations and was found to be in AF/RVR.  Dr. Rayann Heman evaluated and recommended increasing flecainide to 75 mg twice daily and continuing Toprol-XL 100 mg daily.  Past Medical History:  Diagnosis Date  Anxiety    Aortic insufficiency    mild due to degenerative changes   Arthritis    Basal cell carcinoma 2007   [place] [facility] Left leg   Carotid artery occlusion    Chronic kidney disease    CRD Stage 3   Degenerative joint disease    Diverticulosis of colon    Heart murmur    Hyperlipidemia    NMR 07/2009: LDL 200 (2260/1233)TG 99, HDL 65. LDL goal =<120, ideally <100. father MI @ 62   Hypertension    Hypothyroidism    Dr Wilson Singer    Microscopic hematuria    Peripheral neuropathy    compressive in UE bilaterally; Dr Daylene Katayama   Peripheral vascular disease ([place])    ICA bilat, Dr.Charles fields, [facility]   Pneumonia    2017   PONV (postoperative nausea and vomiting)    "Inner ear, does  okay with Scopolamine"   Pulmonary embolus ([place])    Rectocele    [place] spotted fever    Past Surgical History:  Procedure Laterality Date   ABDOMINAL HYSTERECTOMY  1973   age 1 due to dysfuctional menses; HRT x 25-30 years   APPENDECTOMY  1952   basal cell cancer  03/2006   leg   BILATERAL OOPHORECTOMY  1990   prophylactically (sister had ovarian ca)   BRONCHIAL BRUSHINGS  08/30/2019   Procedure: BRONCHIAL BRUSHINGS;  Surgeon: Candee Furbish, MD;  Location: [facility];  Service: Endoscopy;;   BRONCHIAL WASHINGS  08/30/2019   Procedure: BRONCHIAL WASHINGS;  Surgeon: Candee Furbish, MD;  Location: [facility];  Service: Endoscopy;;   CARPAL TUNNEL RELEASE Bilateral 1989   right   CATARACT EXTRACTION, BILATERAL  2010   Dr Kathrin Penner   CHOLECYSTECTOMY  2006   COLONOSCOPY     Dr Ardis Hughs   EYE SURGERY Left    Bilateral eye (film removed lt eye 01/09/17)   LUMBAR LAMINECTOMY/DECOMPRESSION MICRODISCECTOMY Left 03/20/2017   Procedure: LEFT LUMBAR FOURLUMBAR FIVE LAMINOTOMY AND MICROICRODISCECTOMY 1 LEVEL;  Surgeon: Jovita Gamma, MD;  Location: [place];  Service: Neurosurgery;  Laterality: Left;  LEFT   NM MYOVIEW LTD  06/13/2008   low risk scan   ORTHOPEDIC SURGERY  1989/200/2002/2012   elbows,shoulder surgery x 3, right hand   PACEMAKER IMPLANT N/A 09/06/2019   Procedure: PACEMAKER IMPLANT;  Surgeon: Evans Lance, MD;  Location: [place] [facility];  Service: Cardiovascular;  Laterality: N/A;   RECTOCELE REPAIR  2016   TONSILLECTOMY  1957   US ECHOCARDIOGRAPHY  05/01/2010   trace MR,AI,TR;EF =>55%   VIDEO BRONCHOSCOPY Bilateral 07/17/2017   Procedure: VIDEO BRONCHOSCOPY WITHOUT FLUORO;  Surgeon: Rigoberto Noel, MD;   Location: [facility];  Service: Cardiopulmonary;  Laterality: Bilateral;   VIDEO BRONCHOSCOPY Bilateral 09/02/2017   Procedure: VIDEO BRONCHOSCOPY WITHOUT FLUORO;  Surgeon: Rigoberto Noel, MD;  Location: [facility];  Service: Cardiopulmonary;  Laterality: Bilateral;   VIDEO BRONCHOSCOPY N/A 08/30/2019   Procedure: VIDEO BRONCHOSCOPY WITHOUT FLUORO;  Surgeon: Candee Furbish, MD;  Location[facility];  Service: Endoscopy;  Laterality: N/A;    Home Medications:  Prior to Admission medications   Medication Sig Start Date End Date Taking? Authorizing Provider  apixaban (ELIQUIS) 2.5 MG TABS tablet Take 1 tablet (2.5 mg total) by mouth 2 (two) times daily. APPOINTMENT NEEDED FOR FURTHER REFILLS. CONTACT YOUR CARDIOLOGIST Patient taking differently: Take 2.5 mg by mouth 2 (two) times daily. 02/15/21  Yes Croitoru, Dani Gobble, MD  B Complex-C (B-COMPLEX WITH VITAMIN  C) tablet Take 1 tablet by mouth daily after supper. 03/17/21  Yes Antonieta Pert, MD  Ca Carbonate-Mag Hydroxide 400-135 MG/5ML SUSP Take 5 mLs by mouth daily as needed (constipation).   Yes [provider]  cholecalciferol (VITAMIN D3) 25 MCG (1000 UNIT) tablet Take 1,000 Units by mouth daily.   Yes [provider]  cyclobenzaprine (FLEXERIL) 10 MG tablet Take 10 mg by mouth every 12 (twelve) hours as needed for muscle spasms.   Yes [provider]  flecainide (TAMBOCOR) 50 MG tablet Take 1.5 tablets (75 mg total) by mouth 2 (two) times daily. TAKE 1 TABLET(50 MG) BY MOUTH EVERY 12 HOURS Patient taking differently: Take 75 mg by mouth every 12 (twelve) hours. 01/23/21  Yes Baldwin Jamaica, PA-C  hydrALAZINE (APRESOLINE) 25 MG tablet Take 1 tablet (25 mg total) by mouth every 8 (eight) hours as needed (if systolic bp more than 578 mmhg). 09/19/20 09/19/21 Yes Shelly Coss, MD  levothyroxine (SYNTHROID) 88 MCG tablet Take 88 mcg by mouth daily before breakfast.   Yes [provider]  meclizine (ANTIVERT) 25 MG  tablet Take 1 tablet (25 mg total) by mouth daily. 1/2 every 8 hrs prn for imbalance Patient taking differently: Take 12.5 mg by mouth 2 (two) times daily. 11/01/10  Yes Hendricks Limes, MD  metoprolol succinate (TOPROL-XL) 100 MG 24 hr tablet Take 1 tablet (100 mg total) by mouth daily. Take with or immediately following a meal. 01/23/21 10/20/21 Yes Baldwin Jamaica, PA-C  senna (SENOKOT) 8.6 MG tablet Take 1 tablet by mouth every other day.    Yes [provider]  traMADol (ULTRAM) 50 MG tablet Take 0.5-1 tablets (25-50 mg total) by mouth 3 (three) times daily as needed for up to 4 doses for moderate pain. Patient taking differently: Take 25-50 mg by mouth every 8 (eight) hours as needed for severe pain or moderate pain. 03/19/21  Yes Antonieta Pert, MD  feeding supplement (ENSURE ENLIVE / ENSURE PLUS) LIQD Take 237 mLs by mouth 2 (two) times daily between meals. Patient not taking: Reported on 05/12/2021 03/17/21   Antonieta Pert, MD   Inpatient Medications: Scheduled Meds:  apixaban  2.5 mg Oral BID   docusate sodium  100 mg Oral BID   [START ON 05/14/2021] levothyroxine  100 mcg Oral QAC breakfast   metoprolol succinate  100 mg Oral Daily   potassium chloride  20 mEq Oral Once   senna  1 tablet Oral QODAY   sodium chloride flush  3 mL Intravenous Q12H   Continuous Infusions:  lactated ringers 100 mL/hr at 05/13/21 1008   valproate sodium     PRN Meds: acetaminophen **OR** acetaminophen, bisacodyl, hydrALAZINE, meclizine, oxyCODONE, polyethylene glycol, traMADol, valproate sodium  Allergies:    Allergies  Allergen Reactions   Penicillins Itching, Swelling and Other (See Comments)    REACTION: itching and edema   Aspirin Nausea Only and Other (See Comments)    REACTION: GI upset  ( pt can take 81 mg but NOT   325 mg ASA )   Lovastatin Nausea Only and Other (See Comments)    REACTION: nausea   Benazepril Hcl Other (See Comments)    Unknown   Colesevelam Hcl Other (See Comments)    Doxycycline Hyclate Other (See Comments)   Febuxostat Other (See Comments)    Other reaction(s): angioedema   Hydrocodone-Acetaminophen Other (See Comments)    Other reaction(s): irritable   Losartan Other (See Comments)    unknown  Paroxetine Other (See Comments)    Unknown   Statins Depletion [Acid Blockers Support] Other (See Comments)   Tape Other (See Comments)   Valacyclovir Hcl Nausea And Vomiting   Bupropion Hcl Other (See Comments)    REACTION: tinnitis   Ezetimibe Nausea And Vomiting and Other (See Comments)    REACTION: GI symptoms   Fenofibrate Other (See Comments)    Myalgia    Pravastatin Sodium Other (See Comments)    REACTION: elevated CPK - Muscle's in bilateral Leg   Social History:   Social History   Socioeconomic History   Marital status: Widowed    Spouse name: Not on file   Number of children: 1   Years of education: Not on file   Highest education level: Not on file  Occupational History   Occupation: retired  Tobacco Use   Smoking status: Never   Smokeless tobacco: Never  Vaping Use   Vaping Use: Never used  Substance and Sexual Activity   Alcohol use: No    Alcohol/week: 0.0 standard drinks   Drug use: No   Sexual activity: Not on file  Other Topics Concern   Not on file  Social History Narrative   Low cholesterol diet   Regular exercise- yes    Social Determinants of Health   Financial Resource Strain: Not on file  Food Insecurity: Not on file  Transportation Needs: Not on file  Physical Activity: Not on file  Stress: Not on file  Social Connections: Not on file  Intimate Partner Violence: Not on file    Family History:    Family History  Problem Relation Age of Onset   Heart attack Father 17   Heart disease Father        Before age 52   Colon cancer Mother 83   Stroke Mother 55   Kidney disease Mother        ? renal calculi; S/P resecton of kidney   Cancer - Colon Mother 74   Ovarian cancer Sister    Other Sister         Valve replacement (aortic & mitral ) in 2 sisters   Heart disease Sister        Before age 73   Heart disease Brother        aortic & mitral valve replacement in 2 bro; both had CBAG   Diabetes Neg Hx    Breast cancer Neg Hx     ROS:  Review of Systems: [y] = yes, [ ]  = no      General: Weight gain [ ] ; Weight loss [ ] ; Anorexia [ ] ; Fatigue [ ] ; Fever [ ] ; Chills [ ] ; Weakness [y]   Cardiac: Chest pain/pressure [ ] ; Resting SOB [ ] ; Exertional SOB [ ] ; Orthopnea [ ] ; Pedal Edema [ ] ; Palpitations [ ] ; Syncope [ ] ; Presyncope [ ] ; Paroxysmal nocturnal dyspnea [ ]    Pulmonary: Cough [ ] ; Wheezing [ ] ; Hemoptysis [ ] ; Sputum [ ] ; Snoring [ ]    GI: Vomiting [ ] ; Dysphagia [ ] ; Melena [ ] ; Hematochezia [ ] ; Heartburn [ ] ; Abdominal pain [ ] ; Constipation [ ] ; Diarrhea [ ] ; BRBPR [ ]    GU: Hematuria [ ] ; Dysuria [ ] ; Nocturia [ ]  Vascular: Pain in legs with walking [ ] ; Pain in feet with lying flat [ ] ; Non-healing sores [ ] ; Stroke [ ] ; TIA [ ] ; Slurred speech [ ] ;   Neuro: Headaches [ ] ; Vertigo [ ] ; Seizures [ ] ;  Paresthesias [ ] ;Blurred vision [ ] ; Diplopia [ ] ; Vision changes [ ]    Ortho/Skin: Arthritis [ ] ; Joint pain [ ] ; Muscle pain [ ] ; Joint swelling [ ] ; Back Pain [ ] ; Rash [ ]    Psych: Depression [ ] ; Anxiety [ ]    Heme: Bleeding problems [ ] ; Clotting disorders [ ] ; Anemia [ ]    Endocrine: Diabetes [ ] ; Thyroid dysfunction [ ]    Physical Exam/Data:   Vitals:   05/12/21 2349 05/13/21 0442 05/13/21 0759 05/13/21 1554  BP: (!) 172/105 (!) 158/98 (!) 142/85 129/87  Pulse: 89 (!) 56 (!) 59 84  Resp: 16 16 16 18   Temp: 97.9 F (36.6 C) 98.2 F (36.8 C) 97.7 F (36.5 C) (!) 97.5 F (36.4 C)  TempSrc: Oral Oral Oral Oral  SpO2: 98% 98% 100% 100%  Weight:      Height:        Intake/Output Summary (Last 24 hours) at 05/13/2021 1737 Last data filed at 05/13/2021 0550 Gross per 24 hour  Intake 1200.18 ml  Output 450 ml  Net 750.18 ml   Last 3 Weights 05/12/2021  03/18/2021 01/23/2021  Weight (lbs) 100 lb 97 lb 3.6 oz 100 lb  Weight (kg) 45.36 kg 44.1 kg 45.36 kg     Body mass index is 16.64 kg/m.  General:  Well nourished, well developed, in no acute distress HEENT: normal Lymph: no adenopathy Neck: no JVD Endocrine:  No thryomegaly Vascular: No carotid bruits; FA pulses 2+ bilaterally without bruits  Cardiac:  normal S1, S2; RRR; no murmur  Lungs:  clear to auscultation bilaterally, no wheezing, rhonchi or rales  Abd: soft, nontender, no hepatomegaly  Ext: no edema Musculoskeletal:  No deformities, BUE and BLE strength normal and equal Skin: warm and dry  Neuro:  CNs 2-12 intact, no focal abnormalities noted Psych:  Normal affect   EKG:  The EKG was personally reviewed and demonstrates: ASVP rhythm. Interrogated device (SJM/Abbott) and patient not device dependent (sinus brady 35-55). Switched to VVI 30 and obtained repeat ECG. AVB more prolonged than prior and QRS duration wider than before.  Telemetry:  Telemetry was personally reviewed and demonstrates: ASVP   Relevant CV Studies:  TTE Result date: 03/13/21  1. Left ventricular ejection fraction, by estimation, is 60 to 65%. The  left ventricle has normal function. The left ventricle has no regional  wall motion abnormalities. Left ventricular diastolic parameters are  indeterminate.   2. Right ventricular systolic function is normal. The right ventricular  size is normal. There is normal pulmonary artery systolic pressure.   3. The mitral valve is normal in structure. Trivial mitral valve  regurgitation. No evidence of mitral stenosis.   4. The aortic valve is calcified. There is moderate calcification of the  aortic valve. There is moderate thickening of the aortic valve. Aortic  valve regurgitation is mild. No aortic stenosis is present. Aortic valve  area, by VTI measures 1.46 cm.  Aortic valve mean gradient measures 9.0 mmHg. Aortic valve Vmax measures  2.06 m/s.   5. The  inferior vena cava is normal in size with greater than 50%  respiratory variability, suggesting right atrial pressure of 3 mmHg.   Laboratory Data:  High Sensitivity Troponin:  No results for input(s): TROPONINIHS in the last 720 hours.   Chemistry Recent Labs  Lab 05/12/21 0714 05/13/21 0707  NA 138 138  K 3.9 3.8  CL 100 104  CO2 27 26  GLUCOSE 114* 118*  BUN 33*  31*  CREATININE 2.83* 2.46*  CALCIUM 9.3 8.9  GFRNONAA 16* 19*  ANIONGAP 11 8    No results for input(s): PROT, ALBUMIN, AST, ALT, ALKPHOS, BILITOT in the last 168 hours. Hematology Recent Labs  Lab 05/12/21 0714 05/13/21 0707  WBC 8.8 9.6  RBC 4.37 4.08  HGB 13.2 12.2  HCT 41.7 38.4  MCV 95.4 94.1  MCH 30.2 29.9  MCHC 31.7 31.8  RDW 15.9* 15.9*  PLT 304 269   BNPNo results for input(s): BNP, PROBNP in the last 168 hours.  DDimer No results for input(s): DDIMER in the last 168 hours.  Radiology/Studies:  CT Head Wo Contrast  Result Date: 05/12/2021 CLINICAL DATA:  80 year old female status post fall on Eliquis. Left forehead hematoma. EXAM: CT HEAD WITHOUT CONTRAST TECHNIQUE: Contiguous axial images were obtained from the base of the skull through the vertex without intravenous contrast. COMPARISON:  Brain MRI 06/30/2003.  Head CT 03/12/2021. FINDINGS: Brain: No midline shift, ventriculomegaly, mass effect, evidence of mass lesion, intracranial hemorrhage or evidence of cortically based acute infarction. Patchy and confluent bilateral white matter hypodensity is stable. Stable vascular calcification in the left basal ganglia. Vascular: Calcified atherosclerosis at the skull base. No suspicious intracranial vascular hyperdensity. Skull: Stable and intact. Sinuses/Orbits: Visualized paranasal sinuses and mastoids are clear. Other: Left anterior convexity, supraorbital scalp contusion and hematoma on series 4, image 29 up to 5 mm in thickness. Underlying calvarium intact. Negative other orbit and scalp soft  tissues. IMPRESSION: 1. Left scalp soft tissue injury without underlying skull fracture. 2. No acute intracranial abnormality. Stable chronic white matter disease. Electronically Signed   By: Genevie Ann M.D.   On: 05/12/2021 07:49   DG CHEST PORT 1 VIEW  Result Date: 05/13/2021 CLINICAL DATA:  Altered mental status EXAM: PORTABLE CHEST 1 VIEW COMPARISON:  03/12/2021 FINDINGS: Cardiomegaly with left chest multi lead pacer. Unchanged biapical pleuroparenchymal scarring. No acute airspace opacity. The visualized skeletal structures are unremarkable. IMPRESSION: Cardiomegaly. No acute abnormality of the lungs in AP portable projection. Electronically Signed   By: Delanna Ahmadi M.D.   On: 05/13/2021 09:18    Assessment and Plan:   Abnormal ECG Consulted for abnormal ECG in the setting of AS/VP rhythm with prior comparisons reflecting native conduction.  However on device interrogation her intrinsic conduction is sinus bradycardia 35-55 bpm. Atrial threshold 2.0 V at 0.5 ms. Ventricular threshold 1.0 V at 0.5 ms. ECG obtained without pacing with PR 276 and QRS 158 ms both which are increased from March 12, 2021 when she was in sinus rhythm and had PR of 197 a QRS of 123 ms. Although the PR does not really matter given that she is paced, the QRS duration represents a 28% increase.  Although I do not think that her presentation is reflective of flecainide toxicity, she has only had 3 episodes of recent AF from 4 to 6 seconds and no episodes of sustained atrial fibrillation recently so her burden appears to be fairly low.  She is otherwise asymptomatic during the episodes and only occasionally feels palpitations.  She is hypothyroid and on Synthroid with a persistently elevated TSH on recent checks. Although she doesn't have signs of flecainide toxicity I think she could have recurrent AKI episodes in the future since this is her second this year so not an ideal AAD option for her going forward.  - d/c  flecainide - will coordinate OP f/u  - if AF burden increases then amio load, otherwise continue rate control  with toprol XL and apixaban for stroke risk reduction (2.5 mg bid, age 74, wt 45.4 kg, bl sCr 1.9)  For questions or updates, please contact [place] Please consult [email or web address] for contact info under   Signed, Dion Body, MD  05/13/2021 5:37 PM

## 2021-05-13 NOTE — Progress Notes (Signed)
Patient has not urinated on this shift. Bladder scan shows 150 ml of urine. Will continue to monitor.

## 2021-05-13 NOTE — Consult Note (Signed)
[facility] Face-to-Face Psychiatry Consult   Reason for Consult:  capacity Referring Physician:  Dr. Lorin Mercy Patient Identification: Deborah Shields MRN:  [id number] Principal Diagnosis: AKI (acute kidney injury) [facility]) Diagnosis:  Principal Problem:   AKI (acute kidney injury) ([place]) Active Problems:   Hypothyroidism (acquired)   Hyperlipidemia with target LDL less than 100   Stage 4 chronic kidney disease ([place])   Hypertension, accelerated with heart disease, without CHF   Chronic a-fib ([place])   Total Time spent with patient: 20 minutes  Subjective:   Capacity Evaluation  Deborah Shields is a 80 y.o. female with a past psychiatric history of dementia, currently presenting with AKI (acute kidney injury) ([place]).  Per chart review, daughter had reported that patient has hallucinations and paranoia associated with her dementia.Psychiatry was consulted to assess capacity to refuse placement in SNF.  Per chart review, patient became agitated and paranoid last night and accused nursing staff of trying to kill her. She received PRN haldol 2 mg IV.  On interview this AM, patient is AAO to self, month and year. She is aware that she is in the hospital but believes that she is at [facility].  Performed brief confusion assessment as below which was positive. She denies SI/HI/AVH.   On interview, upon introduction as being the psychiatrist and purpose for assessment, she states, "you never really know everything is psychiatry". Patient unable/unwilling to clarify this statement. She recalls when brought her into the hospital and states that she was previously at [facility]. She state that "I was not there for the nursing. I was there for the fun. I was there for 30 months". When asked about the events of last night, she states that "I played a part" and goes on to discuss that it was "not clear" what the staff intentions were last night. She denies feeling paranoid at present and denies feeling that  the staff are trying to harm her but indicates that there is still some suspicion regarding staff. She states that upon discharge from the hospital she would like to return home.   Communicated with Dr. Lorin Mercy if internal medicine who placed consult for capacity-stated that patient's daughter is concerned about her ability to take care of herself and would like her to go to SNF and that capacity consult is for ability to refuse SNF. When discussing SNF, patient indicates that she does not need assistance from a nursing facility as she was only there "for the fun" previously and that this is not something that is needed currently. She is currently not able to demonstrate understanding or appreciation at this time which is needed for decision making. Additionally, patient demonstrates some paranoia which affects one's capacity for decision making.  Capacity for a given decision requires: Understanding - the ability to state the meaning of the relevant information (eg, diagnosis, risks and benefits of a treatment or procedure, indications, and options of care). The patient must be able to understand the proposed treatment, and/or options for her care. Appreciation - the ability to explain how information (eg, diagnosis, benefit, and risk) applies to oneself. The patient must be able to appreciate her own medical situation, and abstract, as to how the treatments/proposed options for care, apply to her medical situation. Reasoning - the ability to compare information and infer consequences of choice. The patient must be able to understand the consequences of her medical decision, in a reasonable manner, supported by the facts, and her own values, in a consistent fashion. Expressing  a choice - the ability to state a decision. The patient must demonstrate the ability to communicate a choice, clearly and consistently.    bCAM 1. Acute change and/or fluctuating course of mental status: yes 2. Inattention :  yes- she states "december, november, october, July" "Can you name the months backwards from [place] to July?" 3. Altered Level of Consciousness: no 4. Disorganized Thinking (Errors >1/6): yes, had 2 errors "Will a stone float on water?"  "Yes" "Are there fish in the sea?" "Yes" "Does one pound weigh more than two?" "Yes" "Can you use a hammer to pound a nail?" "Yes" Command(s): "Hold up 2 fingers." -able to perform "Now do the same thing with the other hand."- able to perform  Score: 1+2 AND, either 3 or 4 present = Positive b-CAM       Risk to Self:  no Risk to Others:  no Prior Inpatient Therapy:  denied Prior Outpatient Therapy:  denied  Past Medical History:  Past Medical History:  Diagnosis Date   Anxiety    Aortic insufficiency    mild due to degenerative changes   Arthritis    Basal cell carcinoma 2007   [facility] Left leg   Carotid artery occlusion    Chronic kidney disease    CRD Stage 3   Degenerative joint disease    Diverticulosis of colon    Heart murmur    Hyperlipidemia    NMR 07/2009: LDL 200 (2260/1233)TG 99, HDL 65. LDL goal =<120, ideally <100. father MI @ 47   Hypertension    Hypothyroidism    Dr Wilson Singer   Microscopic hematuria    Peripheral neuropathy    compressive in UE bilaterally; Dr Daylene Katayama   Peripheral vascular disease ([place])    ICA bilat, Dr.Charles fields, [facility]   Pneumonia    2017   PONV (postoperative nausea and vomiting)    "Inner ear, does  okay with Scopolamine"   Pulmonary embolus ([place])    Rectocele    [place] spotted fever     Past Surgical History:  Procedure Laterality Date   ABDOMINAL HYSTERECTOMY  1973   age 33 due to dysfuctional menses; HRT x 25-30 years   APPENDECTOMY  1952   basal cell cancer  03/2006   leg   BILATERAL OOPHORECTOMY  1990   prophylactically (sister had ovarian ca)   BRONCHIAL BRUSHINGS  08/30/2019   Procedure: BRONCHIAL BRUSHINGS;  Surgeon: Candee Furbish, MD;  Location: [facility];   Service: Endoscopy;;   BRONCHIAL WASHINGS  08/30/2019   Procedure: BRONCHIAL WASHINGS;  Surgeon: Candee Furbish, MD;  [facility]: [facility];  Service: Endoscopy;;   CARPAL TUNNEL RELEASE Bilateral 1989   right   CATARACT EXTRACTION, BILATERAL  2010   Dr Kathrin Penner   CHOLECYSTECTOMY  2006   COLONOSCOPY     Dr Ardis Hughs   EYE SURGERY Left    Bilateral eye (film removed lt eye 01/09/17)   LUMBAR LAMINECTOMY/DECOMPRESSION MICRODISCECTOMY Left 03/20/2017   Procedure: LEFT LUMBAR FOURLUMBAR FIVE LAMINOTOMY AND MICROICRODISCECTOMY 1 LEVEL;  Surgeon: Jovita Gamma, MD;  Location: [facility];  Service: Neurosurgery;  Laterality: Left;  LEFT   NM MYOVIEW LTD  06/13/2008   low risk scan   ORTHOPEDIC SURGERY  1989/200/2002/2012   elbows,shoulder surgery x 3, right hand   PACEMAKER IMPLANT N/A 09/06/2019   Procedure: PACEMAKER IMPLANT;  Surgeon: Evans Lance, MD;  Location: [place] [facility];  Service: Cardiovascular;  Laterality: N/A;   RECTOCELE REPAIR  2016   TONSILLECTOMY  1957   US ECHOCARDIOGRAPHY  05/01/2010   trace MR,AI,TR;EF =>55%   VIDEO BRONCHOSCOPY Bilateral 07/17/2017   Procedure: VIDEO BRONCHOSCOPY WITHOUT FLUORO;  Surgeon: Rigoberto Noel, MD;  [facility];  Service: Cardiopulmonary;  Laterality: Bilateral;   VIDEO BRONCHOSCOPY Bilateral 09/02/2017   Procedure: VIDEO BRONCHOSCOPY WITHOUT FLUORO;  Surgeon: Rigoberto Noel, MD;  [facility];  Service: Cardiopulmonary;  Laterality: Bilateral;   VIDEO BRONCHOSCOPY N/A 08/30/2019   Procedure: VIDEO BRONCHOSCOPY WITHOUT FLUORO;  Surgeon: Candee Furbish, MD;  [facility];  Service: Endoscopy;  Laterality: N/A;   Family History:  Family History  Problem Relation Age of Onset   Heart attack Father 59   Heart disease Father        Before age 29   Colon cancer Mother 70   Stroke Mother 58   Kidney disease Mother        ? renal calculi; S/P resecton of kidney   Cancer - Colon Mother 26   Ovarian cancer Sister     Other Sister        Valve replacement (aortic & mitral ) in 2 sisters   Heart disease Sister        Before age 34   Heart disease Brother        aortic & mitral valve replacement in 2 bro; both had CBAG   Diabetes Neg Hx    Breast cancer Neg Hx    Family Psychiatric  History: denied Social History:  Social History   Substance and Sexual Activity  Alcohol Use No   Alcohol/week: 0.0 standard drinks     Social History   Substance and Sexual Activity  Drug Use No    Social History   Socioeconomic History   Marital status: Widowed    Spouse name: Not on file   Number of children: 1   Years of education: Not on file   Highest education level: Not on file  Occupational History   Occupation: retired  Tobacco Use   Smoking status: Never   Smokeless tobacco: Never  Vaping Use   Vaping Use: Never used  Substance and Sexual Activity   Alcohol use: No    Alcohol/week: 0.0 standard drinks   Drug use: No   Sexual activity: Not on file  Other Topics Concern   Not on file  Social History Narrative   Low cholesterol diet   Regular exercise- yes    Social Determinants of Health   Financial Resource Strain: Not on file  Food Insecurity: Not on file  Transportation Needs: Not on file  Physical Activity: Not on file  Stress: Not on file  Social Connections: Not on file   Additional Social History:    Allergies:   Allergies  Allergen Reactions   Penicillins Itching, Swelling and Other (See Comments)    REACTION: itching and edema   Aspirin Nausea Only and Other (See Comments)    REACTION: GI upset  ( pt can take 81 mg but NOT   325 mg ASA )   Lovastatin Nausea Only and Other (See Comments)    REACTION: nausea   Benazepril Hcl Other (See Comments)    Unknown   Colesevelam Hcl Other (See Comments)   Doxycycline Hyclate Other (See Comments)   Febuxostat Other (See Comments)    Other reaction(s): angioedema   Hydrocodone-Acetaminophen Other (See Comments)    Other  reaction(s): irritable   Losartan Other (See Comments)  unknown   Paroxetine Other (See Comments)    Unknown   Statins Depletion [Acid Blockers Support] Other (See Comments)   Tape Other (See Comments)   Valacyclovir Hcl Nausea And Vomiting   Bupropion Hcl Other (See Comments)    REACTION: tinnitis   Ezetimibe Nausea And Vomiting and Other (See Comments)    REACTION: GI symptoms   Fenofibrate Other (See Comments)    Myalgia    Pravastatin Sodium Other (See Comments)    REACTION: elevated CPK - Muscle's in bilateral Leg    Labs:  Results for orders placed or performed during the hospital encounter of 05/12/21 (from the past 48 hour(s))  CBC with Differential     Status: Abnormal   Collection Time: 05/12/21  7:14 AM  Result Value Ref Range   WBC 8.8 4.0 - 10.5 K/uL   RBC 4.37 3.87 - 5.11 MIL/uL   Hemoglobin 13.2 12.0 - 15.0 g/dL   HCT 41.7 36.0 - 46.0 %   MCV 95.4 80.0 - 100.0 fL   MCH 30.2 26.0 - 34.0 pg   MCHC 31.7 30.0 - 36.0 g/dL   RDW 15.9 (H) 11.5 - 15.5 %   Platelets 304 150 - 400 K/uL   nRBC 0.0 0.0 - 0.2 %   Neutrophils Relative % 71 %   Neutro Abs 6.3 1.7 - 7.7 K/uL   Lymphocytes Relative 15 %   Lymphs Abs 1.3 0.7 - 4.0 K/uL   Monocytes Relative 9 %   Monocytes Absolute 0.8 0.1 - 1.0 K/uL   Eosinophils Relative 3 %   Eosinophils Absolute 0.3 0.0 - 0.5 K/uL   Basophils Relative 1 %   Basophils Absolute 0.1 0.0 - 0.1 K/uL   Immature Granulocytes 1 %   Abs Immature Granulocytes 0.05 0.00 - 0.07 K/uL    Comment: Performed at [facility], [address] [place], [place] [address]  Basic metabolic panel     Status: Abnormal   Collection Time: 05/12/21  7:14 AM  Result Value Ref Range   Sodium 138 135 - 145 mmol/L   Potassium 3.9 3.5 - 5.1 mmol/L   Chloride 100 98 - 111 mmol/L   CO2 27 22 - 32 mmol/L   Glucose, Bld 114 (H) 70 - 99 mg/dL    Comment: Glucose reference range applies only to samples taken after fasting for at least 8 hours.   BUN 33 (H)  8 - 23 mg/dL   Creatinine, Ser 2.83 (H) 0.44 - 1.00 mg/dL   Calcium 9.3 8.9 - 10.3 mg/dL   GFR, Estimated 16 (L) >60 mL/min    Comment: (NOTE) Calculated using the CKD-EPI Creatinine Equation (2021)    Anion gap 11 5 - 15    Comment: Performed at [facility] [address] [place], [place] [address]  Urinalysis, Routine w reflex microscopic Urine, In & Out Cath     Status: Abnormal   Collection Time: 05/12/21  8:39 AM  Result Value Ref Range   Color, Urine YELLOW YELLOW   APPearance HAZY (A) CLEAR   Specific Gravity, Urine 1.013 1.005 - 1.030   pH 6.0 5.0 - 8.0   Glucose, UA NEGATIVE NEGATIVE mg/dL   Hgb urine dipstick MODERATE (A) NEGATIVE   Bilirubin Urine NEGATIVE NEGATIVE   Ketones, ur NEGATIVE NEGATIVE mg/dL   Protein, ur 30 (A) NEGATIVE mg/dL   Nitrite NEGATIVE NEGATIVE   Leukocytes,Ua TRACE (A) NEGATIVE   RBC / HPF >50 (H) 0 - 5 RBC/hpf  WBC, UA 11-20 0 - 5 WBC/hpf   Bacteria, UA NONE SEEN NONE SEEN   Mucus PRESENT     Comment: Performed at [facility], [place] [address], [place], [place] [address]  Urine Culture     Status: None   Collection Time: 05/12/21 10:20 AM   Specimen: In/Out Cath Urine  Result Value Ref Range   Specimen Description IN/OUT CATH URINE    Special Requests NONE    Culture      NO GROWTH Performed at [facility], [place] [address], [place], [place] [address]    Report Status 05/13/2021 FINAL   Resp Panel by RT-PCR (Flu A&B, Covid) Nasopharyngeal Swab     Status: None   Collection Time: 05/12/21  1:31 PM   Specimen: Nasopharyngeal Swab; Nasopharyngeal(NP) swabs in vial transport medium  Result Value Ref Range   SARS Coronavirus 2 by RT PCR NEGATIVE NEGATIVE    Comment: (NOTE) SARS-CoV-2 target nucleic acids are NOT DETECTED.  The SARS-CoV-2 RNA is generally detectable in upper respiratory specimens during the acute phase of infection. The lowest concentration of SARS-CoV-2 viral copies this assay can detect is 138  copies/mL. A negative result does not preclude SARS-Cov-2 infection and should not be used as the sole basis for treatment or other patient management decisions. A negative result may occur with  improper specimen collection/handling, submission of specimen other than nasopharyngeal swab, presence of viral mutation(s) within the areas targeted by this assay, and inadequate number of viral copies(<138 copies/mL). A negative result must be combined with clinical observations, patient history, and epidemiological information. The expected result is Negative.  Fact Sheet for Patients:  EntrepreneurPulse.com.au  Fact Sheet for Healthcare Providers:  IncredibleEmployment.be  This test is no t yet approved or cleared by the [place] FDA and  has been authorized for detection and/or diagnosis of SARS-CoV-2 by FDA under an Emergency Use Authorization (EUA). This EUA will remain  in effect (meaning this test can be used) for the duration of the COVID-19 declaration under Section 564(b)(1) of the Act, 21 U.S.C.section 360bbb-3(b)(1), unless the authorization is terminated  or revoked sooner.       Influenza A by PCR NEGATIVE NEGATIVE   Influenza B by PCR NEGATIVE NEGATIVE    Comment: (NOTE) The Xpert Xpress SARS-CoV-2/FLU/RSV plus assay is intended as an aid in the diagnosis of influenza from Nasopharyngeal swab specimens and should not be used as a sole basis for treatment. Nasal washings and aspirates are unacceptable for Xpert Xpress SARS-CoV-2/FLU/RSV testing.  Fact Sheet for Patients: EntrepreneurPulse.com.au  Fact Sheet for Healthcare Providers: IncredibleEmployment.be  This test is not yet approved or cleared by the [place] FDA and has been authorized for detection and/or diagnosis of SARS-CoV-2 by FDA under an Emergency Use Authorization (EUA). This EUA will remain in effect (meaning this test  can be used) for the duration of the COVID-19 declaration under Section 564(b)(1) of the Act, 21 U.S.C. section 360bbb-3(b)(1), unless the authorization is terminated or revoked.  Performed at [facility], [place] [address], [place], [place] [address]   Basic metabolic panel     Status: Abnormal   Collection Time: 05/13/21  7:07 AM  Result Value Ref Range   Sodium 138 135 - 145 mmol/L   Potassium 3.8 3.5 - 5.1 mmol/L   Chloride 104 98 - 111 mmol/L   CO2 26 22 - 32 mmol/L   Glucose, Bld 118 (H) 70 - 99 mg/dL  Comment: Glucose reference range applies only to samples taken after fasting for at least 8 hours.   BUN 31 (H) 8 - 23 mg/dL   Creatinine, Ser 2.46 (H) 0.44 - 1.00 mg/dL   Calcium 8.9 8.9 - 10.3 mg/dL   GFR, Estimated 19 (L) >60 mL/min    Comment: (NOTE) Calculated using the CKD-EPI Creatinine Equation (2021)    Anion gap 8 5 - 15    Comment: Performed at [facility] [address] [place], [place] [address]  CBC     Status: Abnormal   Collection Time: 05/13/21  7:07 AM  Result Value Ref Range   WBC 9.6 4.0 - 10.5 K/uL   RBC 4.08 3.87 - 5.11 MIL/uL   Hemoglobin 12.2 12.0 - 15.0 g/dL   HCT 38.4 36.0 - 46.0 %   MCV 94.1 80.0 - 100.0 fL   MCH 29.9 26.0 - 34.0 pg   MCHC 31.8 30.0 - 36.0 g/dL   RDW 15.9 (H) 11.5 - 15.5 %   Platelets 269 150 - 400 K/uL   nRBC 0.0 0.0 - 0.2 %    Comment: Performed at [facility], [place] [address] [place], [place] [address]    Current Facility-Administered Medications  Medication Dose Route Frequency Provider Last Rate Last Admin   acetaminophen (TYLENOL) tablet 650 mg  650 mg Oral Q6H PRN Karmen Bongo, MD       Or   acetaminophen (TYLENOL) suppository 650 mg  650 mg Rectal Q6H PRN Karmen Bongo, MD       apixaban Arne Cleveland) tablet 2.5 mg  2.5 mg Oral BID Karmen Bongo, MD   2.5 mg at 05/13/21 0959   bisacodyl (DULCOLAX) EC tablet 5 mg  5 mg Oral Daily PRN Karmen Bongo, MD       docusate sodium (COLACE)  capsule 100 mg  100 mg Oral BID Karmen Bongo, MD   100 mg at 05/13/21 0959   flecainide (TAMBOCOR) tablet 75 mg  75 mg Oral BID Karmen Bongo, MD   75 mg at 05/13/21 1000   hydrALAZINE (APRESOLINE) tablet 25 mg  25 mg Oral Q8H PRN Karmen Bongo, MD   25 mg at 05/13/21 0003   lactated ringers infusion   Intravenous Continuous Eugenie Filler, MD 100 mL/hr at 05/13/21 1008 New Bag at 05/13/21 1008   levothyroxine (SYNTHROID) tablet 88 mcg  88 mcg Oral QAC breakfast Karmen Bongo, MD   88 mcg at 05/13/21 0507   meclizine (ANTIVERT) tablet 12.5 mg  12.5 mg Oral TID PRN Karmen Bongo, MD       metoprolol succinate (TOPROL-XL) 24 hr tablet 100 mg  100 mg Oral Daily Karmen Bongo, MD   100 mg at 05/13/21 0959   ondansetron (ZOFRAN) tablet 4 mg  4 mg Oral Q6H PRN Karmen Bongo, MD       Or   ondansetron [facility]) injection 4 mg  4 mg Intravenous Q6H PRN Karmen Bongo, MD   4 mg at 05/13/21 0600   oxyCODONE (Oxy IR/ROXICODONE) immediate release tablet 5 mg  5 mg Oral Q4H PRN Karmen Bongo, MD       polyethylene glycol (MIRALAX / GLYCOLAX) packet 17 g  17 g Oral Daily PRN Karmen Bongo, MD       senna (SENOKOT) tablet 8.6 mg  1 tablet Oral Cathlean Sauer, MD   8.6 mg at 05/13/21 0959   sodium chloride flush (NS) 0.9 % injection 3 mL  3 mL Intravenous Q12H  Karmen Bongo, MD   3 mL at 05/12/21 2052   traMADol (ULTRAM) tablet 25-50 mg  25-50 mg Oral TID PRN Karmen Bongo, MD        Musculoskeletal: Strength & Muscle Tone:  observed walking with walker with PT with assistance Gait & Station:   observed walking with walker with PT with assistance Patient leans: N/A       Psychiatric Specialty Exam:  Presentation  General Appearance: Appropriate for Environment; Casual Eye Contact:Fair Speech:Clear and Coherent; Normal Rate Speech Volume:Normal Handedness:No data recorded  Mood and Affect  Mood:Euthymic Affect:Appropriate; Congruent  Thought Process  Thought  Processes:Linear Descriptions of Associations:Intact Orientation:Partial (oriented to person, month, year, hospital but believes she is at [facility]) Thought Content:Logical; Paranoid Ideation; Other (comment) (generally logical with some mild paranoia today) History of Schizophrenia/Schizoaffective disorder:No Duration of Psychotic Symptoms:No data recorded Hallucinations:Hallucinations: None Ideas of Reference:None Suicidal Thoughts:Suicidal Thoughts: No Homicidal Thoughts:Homicidal Thoughts: No  Sensorium  Memory:Recent Fair; Immediate Fair Judgment:Impaired Insight:Lacking  Executive Functions  Concentration:-- (impaired) Attention Span:-- (impaired; see bcam) [place]  Psychomotor Activity  Psychomotor Activity:Psychomotor Activity: Normal  Assets  Assets:Communication Skills; Desire for Improvement; Resilience; Social Support  Sleep  Sleep:Sleep: Fair  Physical Exam: Physical Exam Constitutional:      Appearance: Normal appearance.     Comments: Thin   Neurological:     Mental Status: She is alert.     Comments: Oriented to self, year and month. Bel[facility] that she is at [facility]   Review of Systems  Psychiatric/Behavioral:  Negative for depression, hallucinations, substance abuse and suicidal ideas.   Blood pressure (!) 142/85, pulse (!) 59, temperature 97.7 F (36.5 C), temperature source Oral, resp. rate 16, height 5\' 5"  (1.651 m), weight 45.4 kg, SpO2 100 %. Body mass index is 16.64 kg/m.  Treatment Plan Summary: Based upon my evaluation of Madisan Bice regarding her  medical decision-making capacity for refusing SNF ), I found with reasonable certainty that Deborah Shields does not have capacity to refuse SNF at this time.   -Additionally, would recommend to obtain EKG to monitor qtc-most recent EKG revealed qtc 629 -recommend avoiding antipsychotics at this time for agitation due to prolonged  qtc -recommend utilizing 125 mg IV depakote q8hrs prn for agitation until qtc is improved-if start depakote for agitation would also recommend ordering LFTs to monitor. AST/ALT/ALP unremarkable on 03/12/21 labs  Recommendations communicated via epic secure chat to primary team: Dr. [place] Silos  Psychiatry will sign off at this time. Please re-consult if further assistance is needed   Disposition:  per primary  Ival Bible, MD 05/13/2021 12:18 PM

## 2021-05-13 NOTE — Progress Notes (Signed)
Pt refused to get blood drawn. Told phlebotomist to come back later.

## 2021-05-13 NOTE — Progress Notes (Signed)
HOSPITAL MEDICINE OVERNIGHT EVENT NOTE    Discussed prolonged Qtc (647 currently) and widened QRS complexes (currently 162ms) with Dr. Renella Cunas, overnight cardiology coverage.  He took the opportunity to interrogate the patient's device and agrees with holding the patient's Flecanide, especially in the setting of AKI and her advanced age.  He recommends allowing for a Flecanide washout in the hopes that QRS complexes/Qtc will improve as the washout progresses.  He asks to be notified if patient's afib burden increases overnight or if patient develops any other significant symptoms overnight.  EP to follow in consultation in the AM.  Monitoring on telemetry.    Deborah Emerald  MD [facility]

## 2021-05-13 NOTE — Progress Notes (Signed)
Patient woke up very confused yelling and accusing nurses of trying to kill her. Unable to redirect patient. Made provider on call aware. 2 mg of Haldol has been ordered and given, bed in lowest position, call bell is in reach, bed alarm on; will continue to monitor patient.

## 2021-05-14 DIAGNOSIS — Z950 Presence of cardiac pacemaker: Secondary | ICD-10-CM

## 2021-05-14 DIAGNOSIS — E785 Hyperlipidemia, unspecified: Secondary | ICD-10-CM | POA: Diagnosis not present

## 2021-05-14 DIAGNOSIS — N179 Acute kidney failure, unspecified: Secondary | ICD-10-CM | POA: Diagnosis not present

## 2021-05-14 DIAGNOSIS — I119 Hypertensive heart disease without heart failure: Secondary | ICD-10-CM | POA: Diagnosis not present

## 2021-05-14 DIAGNOSIS — I482 Chronic atrial fibrillation, unspecified: Secondary | ICD-10-CM | POA: Diagnosis not present

## 2021-05-14 LAB — BASIC METABOLIC PANEL
Anion gap: 10 (ref 5–15)
BUN: 30 mg/dL — ABNORMAL HIGH (ref 8–23)
CO2: 20 mmol/L — ABNORMAL LOW (ref 22–32)
Calcium: 9.2 mg/dL (ref 8.9–10.3)
Chloride: 106 mmol/L (ref 98–111)
Creatinine, Ser: 2.5 mg/dL — ABNORMAL HIGH (ref 0.44–1.00)
GFR, Estimated: 19 mL/min — ABNORMAL LOW (ref >60)
Glucose, Bld: 92 mg/dL (ref 70–99)
Potassium: 4.6 mmol/L (ref 3.5–5.1)
Sodium: 136 mmol/L (ref 135–145)

## 2021-05-14 LAB — CBC
HCT: 37.5 % (ref 36.0–46.0)
Hemoglobin: 11.9 g/dL — ABNORMAL LOW (ref 12.0–15.0)
MCH: 29.7 pg (ref 26.0–34.0)
MCHC: 31.7 g/dL (ref 30.0–36.0)
MCV: 93.5 fL (ref 80.0–100.0)
Platelets: 241 10*3/uL (ref 150–400)
RBC: 4.01 MIL/uL (ref 3.87–5.11)
RDW: 15.9 % — ABNORMAL HIGH (ref 11.5–15.5)
WBC: 8.2 10*3/uL (ref 4.0–10.5)
nRBC: 0 % (ref 0.0–0.2)

## 2021-05-14 LAB — URINE CULTURE: Culture: <10000 — AB

## 2021-05-14 LAB — MAGNESIUM: Magnesium: 1.9 mg/dL (ref 1.7–2.4)

## 2021-05-14 LAB — RPR: RPR Ser Ql: NONREACTIVE

## 2021-05-14 LAB — MRSA NEXT GEN BY PCR, NASAL: MRSA by PCR Next Gen: DETECTED — AB

## 2021-05-14 MED ORDER — MUPIROCIN 2 % EX OINT
1.0000 "application " | TOPICAL_OINTMENT | Freq: Two times a day (BID) | CUTANEOUS | Status: DC
  Administered 2021-05-14 – 2021-05-16 (×4): 1 via NASAL
  Filled 2021-05-14 (×2): qty 22

## 2021-05-14 MED ORDER — BOOST / RESOURCE BREEZE PO LIQD CUSTOM
1.0000 | Freq: Three times a day (TID) | ORAL | Status: DC
  Administered 2021-05-14 – 2021-05-16 (×3): 1 via ORAL

## 2021-05-14 MED ORDER — MELATONIN 3 MG PO TABS
3.0000 mg | ORAL_TABLET | Freq: Every day | ORAL | Status: DC
  Administered 2021-05-15 (×2): 3 mg via ORAL
  Filled 2021-05-14 (×2): qty 1

## 2021-05-14 NOTE — Progress Notes (Signed)
Mobility Specialist Progress Note:   05/14/21 1400  Mobility  Activity [facility];Ambulated in room  Level of Assistance Minimal assist, patient does 75% or more  Assistive Device BSC;Front wheel walker  Distance Ambulated (ft) 15 ft  Mobility Out of bed for toileting;Ambulated with assistance in room  Mobility Response Tolerated well  Mobility performed by Mobility specialist  $Mobility charge 1 Mobility   Pt received yelling out to get back in bed. Required minA to stand from chair. Ambulated in room to [facility], then to bed. Pt sitting up in bed eating lunch.   Nelta Numbers Mobility Specialist  Phone [phone]

## 2021-05-14 NOTE — Evaluation (Signed)
Physical Therapy Evaluation Patient Details Name: Deborah Shields MRN: [id number] DOB: Nov 07, 1940 Today's Date: 05/14/2021  History of Present Illness  Deborah Shields is a 80 y.o. female presents after a head injury earlier this morning--was crawling to bathroom due to not feeling safe on her feet and bumped her head on cabinet with resultant hematoma. Found to have AMS, AKI with concern for dehydration. PHMx: peripheral vascular disease, peripheral neuropathy, chronic kidney disease, history of PE currently on Eliquis, dementia   Clinical Impression  Pt admitted with above diagnosis. PTA pt was residing at [facility], ambulatory with RW. On eval, she required min assist sit to stand, and min/mod assist ambulation 97' with RW. Pt currently with functional limitations due to the deficits listed below (see PT Problem List). Pt will benefit from skilled PT to increase their independence and safety with mobility to allow discharge to the venue listed below.          Recommendations for follow up therapy are one component of a multi-disciplinary discharge planning process, led by the attending physician.  Recommendations may be updated based on patient status, additional functional criteria and insurance authorization.  Follow Up Recommendations Skilled nursing-short term rehab (<3 hours/day)    Assistance Recommended at Discharge Frequent or constant Supervision/Assistance  Functional Status Assessment Patient has had a recent decline in their functional status and/or demonstrates limited ability to make significant improvements in function in a reasonable and predictable amount of time  Equipment Recommendations  None recommended by PT    Recommendations for Other Services       Precautions / Restrictions Precautions Precautions: Fall      Mobility  Bed Mobility               General bed mobility comments: Pt received in recliner.    Transfers Overall transfer  level: Needs assistance Equipment used: Rolling walker (2 wheels) Transfers: Sit to/from Stand Sit to Stand: Min assist           General transfer comment: cues for sequencing, assist to power up. Increased time    Ambulation/Gait Ambulation/Gait assistance: Min assist;Mod assist Gait Distance (Feet): 80 Feet Assistive device: Rolling walker (2 wheels) Gait Pattern/deviations: Step-through pattern;Decreased stride length Gait velocity: decreased Gait velocity interpretation: <1.8 ft/sec, indicate of risk for recurrent falls   General Gait Details: min assist to stabilize balance, mod assist with maneuvering around obstacles and managing turns  Stairs            Wheelchair Mobility    Modified Rankin (Stroke Patients Only)       Balance Overall balance assessment: Needs assistance Sitting-balance support: Feet supported;No upper extremity supported Sitting balance-Leahy Scale: Fair     Standing balance support: Bilateral upper extremity supported;During functional activity;Reliant on assistive device for balance Standing balance-Leahy Scale: Poor                               Pertinent Vitals/Pain Pain Assessment: No/denies pain    Home Living Family/patient expects to be discharged to:: Skilled nursing facility                   Additional Comments: Pt was living alone up until Sept. 2022 hospitalization. Pt discharged to [facility].    Prior Function Prior Level of Function : Needs assist             Mobility Comments: ambulatory with RW  Hand Dominance        Extremity/Trunk Assessment   Upper Extremity Assessment Upper Extremity Assessment: Defer to OT evaluation    Lower Extremity Assessment Lower Extremity Assessment: Generalized weakness    Cervical / Trunk Assessment Cervical / Trunk Assessment: Kyphotic  Communication      Cognition Arousal/Alertness: Awake/alert Behavior During Therapy: WFL for  tasks assessed/performed Overall Cognitive Status: No family/caregiver present to determine baseline cognitive functioning                                 General Comments: Dementia at baseline. Oriented to person and time. When asked about [place], pt states "I'm not from [place]. I'm from [facility] on [facility]."        General Comments General comments (skin integrity, edema, etc.): VSS on RA    Exercises     Assessment/Plan    PT Assessment Patient needs continued PT services  PT Problem List Decreased strength;Decreased mobility;Decreased safety awareness;Decreased activity tolerance;Decreased cognition;Decreased balance       PT Treatment Interventions Therapeutic activities;DME instruction;Gait training;Therapeutic exercise;Patient/family education;Balance training;Functional mobility training    PT Goals (Current goals can be found in the Care Plan section)  Acute Rehab PT Goals Patient Stated Goal: not stated PT Goal Formulation: Patient unable to participate in goal setting Time For Goal Achievement: 05/28/21 Potential to Achieve Goals: Fair    Frequency Min 2X/week   Barriers to discharge        Co-evaluation               AM-PAC PT "6 Clicks" Mobility  Outcome Measure Help needed turning from your back to your side while in a flat bed without using bedrails?: A Little Help needed moving from lying on your back to sitting on the side of a flat bed without using bedrails?: A Little Help needed moving to and from a bed to a chair (including a wheelchair)?: A Little Help needed standing up from a chair using your arms (e.g., wheelchair or bedside chair)?: A Little Help needed to walk in hospital room?: A Lot Help needed climbing 3-5 steps with a railing? : Total 6 Click Score: 15    End of Session Equipment Utilized During Treatment: Gait belt Activity Tolerance: Patient tolerated treatment well Patient left: in chair;with call  bell/phone within reach;with chair alarm set Nurse Communication: Mobility status PT Visit Diagnosis: Unsteadiness on feet (R26.81);Muscle weakness (generalized) (M62.81)    Time: 9357-0177 PT Time Calculation (min) (ACUTE ONLY): 14 min   Charges:   PT Evaluation $PT Eval Moderate Complexity: 1 Mod          Lorrin Goodell, PT  Office # [phone] Pager [phone]   Lorriane Shire 05/14/2021, 12:28 PM

## 2021-05-14 NOTE — Progress Notes (Signed)
Initial Nutrition Assessment  DOCUMENTATION CODES:   Severe malnutrition in context of chronic illness, Underweight  INTERVENTION:  - Boost Breeze po TID, each supplement provides 250 kcal and 9 grams of protein  NUTRITION DIAGNOSIS:   Severe Malnutrition related to chronic illness (CKDIII, afib) as evidenced by severe fat depletion, severe muscle depletion.  GOAL:   Patient will meet greater than or equal to 90% of their needs  MONITOR:   PO intake, Supplement acceptance, Labs, Weight trends, Skin  REASON FOR ASSESSMENT:   Consult Assessment of nutrition requirement/status (nutrition goals)  ASSESSMENT:   Pt admitted from SNF with head injury secondary to a fall. PMH inlcudes dementia, CKD III, HTN, HLD, hypothyroidism, pacemaker placement, afib, PVD, PE. Previous hospitalization 9/19-9/26 with UTI and proctitis/colitis.  Pt reports eating 3 meals per day PTA. She denies and recent loss of appetite. Observed pt eating her lunch consisting of meatloaf, broccoli and macaroni and cheese. She states that she has tried Ensure but does not like it however, she is willing to try Boost Breeze. She states that as long as her food is soft and chopped, she is able to eat it despite having no lower dentures d/t inability to stay in which she has been trying to get fixed.  She endorses recent weight loss but unsure of how much. Per review of chart, noted 5% weight loss in the last 6 months however remaining consistent in the past 2 months. Will continue to monitor throughout admission.  Medications: colace, synthroid, senokot  Labs: BUN 30, Cr 2.50 I/O's: +2748mL since admission  NUTRITION - FOCUSED PHYSICAL EXAM:  Flowsheet Row Most Recent Value  Orbital Region Severe depletion  Upper Arm Region Severe depletion  Thoracic and Lumbar Region Moderate depletion  Buccal Region Moderate depletion  Temple Region Moderate depletion  Clavicle Bone Region Severe depletion  Clavicle and  Acromion Bone Region Severe depletion  Scapular Bone Region Severe depletion  Dorsal Hand Severe depletion  Patellar Region Moderate depletion  Anterior Thigh Region Moderate depletion  Posterior Calf Region Moderate depletion  Edema (RD Assessment) None  Hair Reviewed  Mouth Other (Comment)  [missing lower dentures]  Nails Reviewed       Diet Order:   Diet Order             Diet regular Room service appropriate? Yes; Fluid consistency: Thin  Diet effective now                   EDUCATION NEEDS:   Education needs have been addressed  Skin:  Skin Assessment: Skin Integrity Issues: Skin Integrity Issues:: Stage I Stage I: coccyx  Last BM:  05/12/21  Height:   Ht Readings from Last 1 Encounters:  05/12/21 5\' 5"  (1.651 m)    Weight:   Wt Readings from Last 1 Encounters:  05/12/21 45.4 kg    BMI:  Body mass index is 16.64 kg/m.  Estimated Nutritional Needs:   Kcal:  1400-1600  Protein:  70-80g  Fluid:  >1.5L  Clayborne Dana, RDN, LDN Clinical Nutrition

## 2021-05-14 NOTE — Progress Notes (Signed)
OT Cancellation Note  Patient Details Name: Deborah Shields MRN: [id number] DOB: 1940-11-28   Cancelled Treatment:    Reason Eval/Treat Not Completed: Other (comment). Pt currently eating her lunch.  [place], OTR/L Acute Rehab Services Pager [phone] Office [phone]    Deborah Shields 05/14/2021, 2:33 PM

## 2021-05-14 NOTE — NC FL2 (Signed)
[place] LEVEL OF CARE SCREENING TOOL     IDENTIFICATION  Patient Name: Deborah Shields Birthdate: 1941-02-16 Sex: female Admission Date (Current Location): 05/12/2021  [facility] and [place] Number:  Herbalist and Address:  [facility], [place] [address], [place], [place] [address]      Provider Number: [id number]  Attending Physician Name and Address:  Flora Lipps, MD  Relative Name and Phone Number:       Current Level of Care: Hospital Recommended Level of Care: [place] Prior Approval Number:    Date Approved/Denied:   PASRR Number: [id number]  Discharge Plan: SNF    Current Diagnoses: Patient Active Problem List   Diagnosis Date Noted   QT prolongation    Chronic a-fib ([place]) 05/12/2021   Pressure injury of skin 03/14/2021   Protein-calorie malnutrition, severe 03/14/2021   UTI (urinary tract infection) 03/13/2021   Colitis 03/13/2021   Closed nondisplaced fracture of greater trochanter of left femur ([place])    AKI (acute kidney injury) ([place]) 01/23/2021   Hypertension, accelerated with heart disease, without CHF 09/19/2020   Atypical chest pain 09/18/2020   SSS (sick sinus syndrome) ([place]) 12/06/2019   Pacemaker 12/06/2019   Hypercholesterolemia 12/06/2019   Underweight 12/06/2019   Pneumonia of right upper lobe due to methicillin resistant Staphylococcus aureus (MRSA) ([place])    Pneumonia due to infectious organism    Community acquired pneumonia 08/16/2018   Malnutrition of moderate degree 07/16/2017   Hemoptysis 07/15/2017   HNP (herniated nucleus pulposus), lumbar 03/20/2017   Edema of both feet 12/10/2016   Tick bite 11/21/2015   Abnormal urine odor 11/21/2015   Other fatigue 11/21/2015   Anxiety 08/15/2015   Encounter for pre-operative cardiovascular clearance 07/10/2015   Pre-operative clearance 07/07/2015   Rotator cuff tear 07/07/2015   Rectocele, female 08/04/2014   COPD with  chronic bronchitis and emphysema ([place]) 02/14/2014   Other malaise and fatigue 12/31/2013   PAF (paroxysmal atrial fibrillation) ([place]) 01/10/2013   Thrombosed external hemorrhoid 11/26/2012   Stage 4 chronic kidney disease ([place]) 09/03/2011   Atherosclerosis of native arteries of the extremities with intermittent claudication 04/04/2011   Occlusion and stenosis of carotid artery without mention of cerebral infarction 04/04/2011   PERIPHERAL NEUROPATHY 08/15/2008   Hypothyroidism (acquired) 08/12/2007   Hyperlipidemia with target LDL less than 100 08/12/2007   Essential hypertension 08/12/2007   Osteopenia 08/12/2007   Carotid artery stenosis, asymptomatic 08/11/2007   Osteoarthritis 08/11/2007   BASAL CELL CARCINOMA, HX OF 08/11/2007   FIBROCYSTIC BREAST DISEASE 11/18/2006   DIVERTICULOSIS, COLON 08/10/2006    Orientation RESPIRATION BLADDER Height & Weight     Self, Place  Normal Continent Weight: 100 lb (45.4 kg) Height:  5\' 5"  (165.1 cm)  BEHAVIORAL SYMPTOMS/MOOD NEUROLOGICAL BOWEL NUTRITION STATUS      Continent Diet  AMBULATORY STATUS COMMUNICATION OF NEEDS Skin   Limited Assist Verbally Normal                       Personal Care Assistance Level of Assistance  Bathing, Feeding, Dressing Bathing Assistance: Limited assistance Feeding assistance: Limited assistance       Functional Limitations Info  Sight, Hearing, Speech Sight Info: Adequate Hearing Info: Adequate Speech Info: Adequate    SPECIAL CARE FACTORS FREQUENCY  PT (By licensed PT), OT (By licensed OT)     PT Frequency: 5x a week OT Frequency: 5x a week  Contractures Contractures Info: Not present    Additional Factors Info  Code Status, Allergies Code Status Info: DNR Allergies Info: Penicillins   Aspirin   Lovastatin   Benazepril Hcl   Colesevelam Hcl   Doxycycline Hyclate   Febuxostat   Hydrocodone-acetaminophen   Losartan   Paroxetine   Statins Depletion (Acid Blockers Support)    Tape   Valacyclovir Hcl   Bupropion Hcl   Ezetimibe   Fenofibrate   Pravastatin Sodium           Current Medications (05/14/2021):  This is the current hospital active medication list Current Facility-Administered Medications  Medication Dose Route Frequency Provider Last Rate Last Admin   acetaminophen (TYLENOL) tablet 650 mg  650 mg Oral Q6H PRN Karmen Bongo, MD       Or   acetaminophen (TYLENOL) suppository 650 mg  650 mg Rectal Q6H PRN Karmen Bongo, MD       apixaban Arne Cleveland) tablet 2.5 mg  2.5 mg Oral BID Karmen Bongo, MD   2.5 mg at 05/14/21 0902   bisacodyl (DULCOLAX) EC tablet 5 mg  5 mg Oral Daily PRN Karmen Bongo, MD       docusate sodium (COLACE) capsule 100 mg  100 mg Oral BID Karmen Bongo, MD   100 mg at 05/14/21 7253   hydrALAZINE (APRESOLINE) tablet 25 mg  25 mg Oral Q8H PRN Karmen Bongo, MD   25 mg at 05/13/21 0003   lactated ringers infusion   Intravenous Continuous Eugenie Filler, MD 100 mL/hr at 05/14/21 0856 New Bag at 05/14/21 0856   levothyroxine (SYNTHROID) tablet 100 mcg  100 mcg Oral QAC breakfast Eugenie Filler, MD   100 mcg at 05/14/21 0540   meclizine (ANTIVERT) tablet 12.5 mg  12.5 mg Oral TID PRN Karmen Bongo, MD       metoprolol succinate (TOPROL-XL) 24 hr tablet 100 mg  100 mg Oral Daily Karmen Bongo, MD   100 mg at 05/14/21 6644   oxyCODONE (Oxy IR/ROXICODONE) immediate release tablet 5 mg  5 mg Oral Q4H PRN Karmen Bongo, MD   5 mg at 05/14/21 0254   polyethylene glycol (MIRALAX / GLYCOLAX) packet 17 g  17 g Oral Daily PRN Karmen Bongo, MD       senna Donavan Burnet) tablet 8.6 mg  1 tablet Oral Cathlean Sauer, MD   8.6 mg at 05/13/21 0959   sodium chloride flush (NS) 0.9 % injection 3 mL  3 mL Intravenous Lillia Mountain, MD   3 mL at 05/14/21 1000   traMADol (ULTRAM) tablet 25-50 mg  25-50 mg Oral TID PRN Karmen Bongo, MD       valproate (DEPACON) 125 mg in dextrose 5 % 50 mL IVPB  125 mg Intravenous Q8H PRN  Eugenie Filler, MD         Discharge Medications: Please see discharge summary for a list of discharge medications.  Relevant Imaging Results:  Relevant Lab Results:   Additional Information SSN: [id number]  Emeterio Reeve, LCSW

## 2021-05-14 NOTE — Progress Notes (Addendum)
PROGRESS NOTE    Deborah Shields  XTG:[id number] DOB: September 20, 1940 DOA: 05/12/2021 PCP: Lajean Manes, MD  Brief Narrative:  Patient is 80 years old female with past medical history of CKD stage III, hypertension, hyperlipidemia, hypothyroidism, atrial fibrillation on Eliquis, DVT pleasant 80 year old female history of CKD stage III, hypertension, hyperlipidemia, hypothyroidism, pacemaker placement, A. fib on Eliquis, PVD and pulmonary embolism presented to hospital with altered mental status acute kidney injury concerning for volume depletion.  Patient was noted to have a hematoma on the head with negative head CT scan.  Patient was noted to have creatinine of 2.8 previously noted at 1.4 in daughter had reported confusion.  Capacity evaluation was also asked by the patient's daughter.  Patient was started on IV fluids and was admitted hospital for further evaluation and treatment.    Assessment & Plan:   Principal Problem:   AKI (acute kidney injury) ([place]) Active Problems:   Hypothyroidism (acquired)   Hyperlipidemia with target LDL less than 100   Stage 4 chronic kidney disease ([place])   Hypertension, accelerated with heart disease, without CHF   Chronic a-fib ([facility])   QT prolongation  Acute kidney injury on chronic kidney disease stage IV Baseline creatinine of around 1.4.  Patient  presented with acute kidney injury with a creatinine of 2.8 on presentation.  UA showed trace leukocytes.  Continue supportive care.  Creatinine improving with hydration.  Creatinine at 2.5 at this time.  Volume depletion/dehydration Continue with IV fluids for now.  Check BMP in AM.   Hypothyroidism Continue Synthroid.  TSH of 14.1.    Superficial hematoma on the head Status post fall.  CT head scan was negative for acute findings with  Possible dementia with possible delusional behavior Patient is alert awake oriented at this time but has been having some issues at home.  Psychiatry was consulted  at the family's request and patient does not have a capacity to  make medical decisions at this time.  Depakote as needed until QTC prolongation is is resolved  QTC prolongation Improved QTC on repeat EKG.  Replace electrolytes as necessary.  Physiology cardiology on board.  Paroxysmal atrial fibrillation Continue Eliquis.  CHA2DS2-VASc score of 4.  Continue Toprol-XL.  Flecainide has been discontinued due to QTC prolongation.  Cardiology has been consulted at this time recommended to discontinue flecainide and continue metoprolol.  Hypertension -Continue hydralazine, Toprol-XL.  Hypothyroidism Continue with Synthroid.  Patient was on 88 mcg at home and this has been increased 100 mcg due to elevated TSH.  DVT prophylaxis: Eliquis  Code Status: DNR  Family Communication:  I spoke with the patient's daughter on the phone and updated her about the clinical condition of the patient. She expresses that patient is unsafe for disposition home  Disposition: Skilled nursing facility as per PT recommendation.  Status is: Inpatient  The patient is inpatient because: Severity of illness, need for skilled nursing facility placement.   Consultants:  Psychiatry  Cardiology  Procedures:  CT head without contrast 05/12/2021 Chest x-ray 05/13/2021  Antimicrobials:  None  Subjective: Today, patient was seen and examined at bedside.  Patient denies any nausea, vomiting, fever or chills.  Objective: Vitals:   05/13/21 1940 05/14/21 0410 05/14/21 0813 05/14/21 0817  BP: (!) 162/87 (!) 166/110  138/87  Pulse: 88 100  (!) 56  Resp: 17 16  19   Temp: 97.6 F (36.4 C) (!) 97.5 F (36.4 C) (!) 97.4 F (36.3 C)   TempSrc: Oral Oral Oral  SpO2: 98% 100%  99%  Weight:      Height:        Intake/Output Summary (Last 24 hours) at 05/14/2021 1328 Last data filed at 05/14/2021 0444 Gross per 24 hour  Intake 1510.69 ml  Output --  Net 1510.69 ml    Filed Weights   29-May-2021 0703   Weight: 45.4 kg    Physical examination: General:  Average built, not in obvious distress, frail, bruising on the left discoloration. HENT:   No scleral pallor or icterus noted. Oral mucosa is moist.  Chest:  Clear breath sounds.  Diminished breath sounds bilaterally. No crackles or wheezes.  CVS: S1 &S2 heard. No murmur.  Irregular rhythm. Abdomen: Soft, nontender, nondistended.  Bowel sounds are heard.   Extremities: No cyanosis, clubbing or edema.  Peripheral pulses are palpable. Psych: Alert, awake and oriented, normal mood CNS:  No cranial nerve deficits.  Power equal in all extremities.   Skin: Warm and dry.  No rashes noted.  Data Reviewed:  I have personally reviewed the following labs and imaging studies.   CBC: Recent Labs  Lab May 29, 2021 0714 05/13/21 0707 05/14/21 0216  WBC 8.8 9.6 8.2  NEUTROABS 6.3  --   --   HGB 13.2 12.2 11.9*  HCT 41.7 38.4 37.5  MCV 95.4 94.1 93.5  PLT 304 269 241     Basic Metabolic Panel: Recent Labs  Lab 05-29-21 0714 05/13/21 0707 05/13/21 1320 05/14/21 0216  NA 138 138  --  136  K 3.9 3.8  --  4.6  CL 100 104  --  106  CO2 27 26  --  20*  GLUCOSE 114* 118*  --  92  BUN 33* 31*  --  30*  CREATININE 2.83* 2.46*  --  2.50*  CALCIUM 9.3 8.9  --  9.2  MG  --   --  2.2 1.9     GFR: Estimated Creatinine Clearance: 12.4 mL/min (A) (by C-G formula based on SCr of 2.5 mg/dL (H)).  Liver Function Tests: No results for input(s): AST, ALT, ALKPHOS, BILITOT, PROT, ALBUMIN in the last 168 hours.  CBG: No results for input(s): GLUCAP in the last 168 hours.   Recent Results (from the past 240 hour(s))  Urine Culture     Status: None   Collection Time: May 29, 2021 10:20 AM   Specimen: In/Out Cath Urine  Result Value Ref Range Status   Specimen Description IN/OUT CATH URINE  Final   Special Requests NONE  Final   Culture   Final    NO GROWTH Performed at [facility], [address], [place], [place] [address]    Report  Status 05/13/2021 FINAL  Final  Resp Panel by RT-PCR (Flu A&B, Covid) Nasopharyngeal Swab     Status: None   Collection Time: 05-29-2021  1:31 PM   Specimen: Nasopharyngeal Swab; Nasopharyngeal(NP) swabs in vial transport medium  Result Value Ref Range Status   SARS Coronavirus 2 by RT PCR NEGATIVE NEGATIVE Final    Comment: (NOTE) SARS-CoV-2 target nucleic acids are NOT DETECTED.  The SARS-CoV-2 RNA is generally detectable in upper respiratory specimens during the acute phase of infection. The lowest concentration of SARS-CoV-2 viral copies this assay can detect is 138 copies/mL. A negative result does not preclude SARS-Cov-2 infection and should not be used as the sole basis for treatment or other patient management decisions. A negative result may occur with  improper specimen collection/handling, submission of specimen other than nasopharyngeal swab,  presence of viral mutation(s) within the areas targeted by this assay, and inadequate number of viral copies(<138 copies/mL). A negative result must be combined with clinical observations, patient history, and epidemiological information. The expected result is Negative.  Fact Sheet for Patients:  EntrepreneurPulse.com.au  Fact Sheet for Healthcare Providers:  IncredibleEmployment.be  This test is no t yet approved or cleared by the [place] FDA and  has been authorized for detection and/or diagnosis of SARS-CoV-2 by FDA under an Emergency Use Authorization (EUA). This EUA will remain  in effect (meaning this test can be used) for the duration of the COVID-19 declaration under Section 564(b)(1) of the Act, 21 U.S.C.section 360bbb-3(b)(1), unless the authorization is terminated  or revoked sooner.       Influenza A by PCR NEGATIVE NEGATIVE Final   Influenza B by PCR NEGATIVE NEGATIVE Final    Comment: (NOTE) The Xpert Xpress SARS-CoV-2/FLU/RSV plus assay is intended as an aid in the  diagnosis of influenza from Nasopharyngeal swab specimens and should not be used as a sole basis for treatment. Nasal washings and aspirates are unacceptable for Xpert Xpress SARS-CoV-2/FLU/RSV testing.  Fact Sheet for Patients: EntrepreneurPulse.com.au  Fact Sheet for Healthcare Providers: IncredibleEmployment.be  This test is not yet approved or cleared by the [place] FDA and has been authorized for detection and/or diagnosis of SARS-CoV-2 by FDA under an Emergency Use Authorization (EUA). This EUA will remain in effect (meaning this test can be used) for the duration of the COVID-19 declaration under Section 564(b)(1) of the Act, 21 U.S.C. section 360bbb-3(b)(1), unless the authorization is terminated or revoked.  Performed at [facility], [place] [address], [place], [place] [address]      Radiology Studies: DG CHEST PORT 1 VIEW  Result Date: 05/13/2021 CLINICAL DATA:  Altered mental status EXAM: PORTABLE CHEST 1 VIEW COMPARISON:  03/12/2021 FINDINGS: Cardiomegaly with left chest multi lead pacer. Unchanged biapical pleuroparenchymal scarring. No acute airspace opacity. The visualized skeletal structures are unremarkable. IMPRESSION: Cardiomegaly. No acute abnormality of the lungs in AP portable projection. Electronically Signed   By: Delanna Ahmadi M.D.   On: 05/13/2021 09:18      Scheduled Meds:  apixaban  2.5 mg Oral BID   docusate sodium  100 mg Oral BID   levothyroxine  100 mcg Oral QAC breakfast   metoprolol succinate  100 mg Oral Daily   senna  1 tablet Oral QODAY   sodium chloride flush  3 mL Intravenous Q12H   Continuous Infusions:  lactated ringers 100 mL/hr at 05/14/21 0856   valproate sodium       LOS: 1 day    Flora Lipps, MD [facility] 05/14/2021, 1:28 PM

## 2021-05-14 NOTE — Evaluation (Signed)
Occupational Therapy Evaluation Patient Details Name: Deborah Shields MRN: [id number] DOB: [birth year] Today's Date: 05/14/2021   History of Present Illness Deborah Shields is a 80 y.o. female presents after a head injury earlier this morning--was crawling to bathroom due to not feeling safe on her feet and bumped her head on cabinet with resultant hematoma. Found to have AMS, AKI with concern for dehydration. PHMx: peripheral vascular disease, peripheral neuropathy, chronic kidney disease, history of PE currently on Eliquis, dementia   Clinical Impression   This 80 yo female admitted with above presents to acute OT with PLOF per her report of being independent with bathing (sponge), dressing, toileting. Currently she is setup/S-minA for basic ADLs. She will continue to benefit from acute OT with follow up at SNF.      Recommendations for follow up therapy are one component of a multi-disciplinary discharge planning process, led by the attending physician.  Recommendations may be updated based on patient status, additional functional criteria and insurance authorization.   Follow Up Recommendations  Skilled nursing-short term rehab (<3 hours/day)    Assistance Recommended at Discharge Frequent or constant Supervision/Assistance  Functional Status Assessment  Patient has had a recent decline in their functional status and demonstrates the ability to make significant improvements in function in a reasonable and predictable amount of time.  Equipment Recommendations  None recommended by OT       Precautions / Restrictions Precautions Precautions: Fall Restrictions Weight Bearing Restrictions: No      Mobility Bed Mobility Overal bed mobility: Needs Assistance Bed Mobility: Supine to Sit     Supine to sit: Min assist;HOB elevated     General bed mobility comments: increased time, VCs    Transfers Overall transfer level: Needs assistance Equipment used: Rolling walker  (2 wheels) Transfers: Sit to/from Stand Sit to Stand: Min assist           General transfer comment: Cues for safe hand placement      Balance Overall balance assessment: Needs assistance Sitting-balance support: No upper extremity supported;Feet supported Sitting balance-Leahy Scale: Fair     Standing balance support: Bilateral upper extremity supported;Reliant on assistive device for balance Standing balance-Leahy Scale: Poor                             ADL either performed or assessed with clinical judgement   ADL Overall ADL's : Needs assistance/impaired Eating/Feeding: Set up;Bed level   Grooming: Set up;Sitting;Wash/dry face;Wash/dry hands;Brushing hair   Upper Body Bathing: Set up;Supervision/ safety;Sitting   Lower Body Bathing: Minimal assistance;Sit to/from stand   Upper Body Dressing : Minimal assistance;Sitting   Lower Body Dressing: Minimal assistance;Sit to/from stand   Toilet Transfer: Minimal assistance;Stand-pivot;BSC/3in1   Toileting- Clothing Manipulation and Hygiene: Minimal assistance;Sit to/from stand               Vision Patient Visual Report: No change from baseline              Pertinent Vitals/Pain Pain Assessment: No/denies pain     Hand Dominance Right   Extremity/Trunk Assessment Upper Extremity Assessment Upper Extremity Assessment: Generalized weakness (OA in hands but does not impede function)      Communication Communication Communication: No difficulties   Cognition Arousal/Alertness: Awake/alert Behavior During Therapy: WFL for tasks assessed/performed Overall Cognitive Status: No family/caregiver present to determine baseline cognitive functioning  General Comments: Dementia at baseline. Oriented to person and time. When asked about [place], pt states "I'm not from [place]. I just go there for fun"                Home Living Family/patient  expects to be discharged to:: Skilled nursing facility                                 Additional Comments: Pt was living alone up until Sept. 2022 hospitalization. [facility] to [facility].      Prior Functioning/Environment Prior Level of Function : Needs assist             Mobility Comments: ambulatory with RW ADLs Comments: Reports she baths/dresses/toilets herself.        OT Problem List: Impaired balance (sitting and/or standing);Decreased cognition;Decreased safety awareness      OT Treatment/Interventions: Self-care/ADL training;DME and/or AE instruction;Patient/family education;Balance training    OT Goals(Current goals can be found in the care plan section) Acute Rehab OT Goals Patient Stated Goal: to go home OT Goal Formulation: With patient Time For Goal Achievement: 05/28/21 Potential to Achieve Goals: Good  OT Frequency: Min 2X/week              AM-PAC OT "6 Clicks" Daily Activity     Outcome Measure Help from another person eating meals?: A Little Help from another person taking care of personal grooming?: A Little Help from another person toileting, which includes using toliet, bedpan, or urinal?: A Little Help from another person bathing (including washing, rinsing, drying)?: A Little Help from another person to put on and taking off regular upper body clothing?: A Little Help from another person to put on and taking off regular lower body clothing?: A Little 6 Click Score: 18   End of Session Equipment Utilized During Treatment: Gait belt;Rolling walker (2 wheels)  Activity Tolerance: Patient tolerated treatment well Patient left: in bed;with call bell/phone within reach;with bed alarm set  OT Visit Diagnosis: Unsteadiness on feet (R26.81);Other abnormalities of gait and mobility (R26.89);Muscle weakness (generalized) (M62.81);Other symptoms and signs involving cognitive function                Time: 1450-1505 OT Time  Calculation (min): 15 min Charges:  OT General Charges $OT Visit: 1 Visit OT Evaluation $OT Eval Moderate Complexity: [place], OTR/L Acute [employer] Pager [phone] Office [phone]    Almon Register 05/14/2021, 3:12 PM

## 2021-05-14 NOTE — TOC CAGE-AID Note (Signed)
Transition of Care [facility]) - CAGE-AID Screening   Patient Details  Name: Deborah Shields MRN: [id number] Date of Birth: [birth year]  Transition of Care [facility]) CM/SW Contact:    Shekina Cordell C Tarpley-Carter, [place] Phone Number: 05/14/2021, 9:50 AM   Clinical Narrative: Pt is unable to participate in Cage Aid.  Pt is experiencing dementia.  Cebastian Neis Tarpley-Carter, MSW, LCSW-A Pronouns:  She/Her/Hers [facility] HealthTransitions of Care Clinical Social Worker Direct Number:  [phone] [email or web address]  CAGE-AID Screening: Substance Abuse Screening unable to be completed due to: : Patient unable to participate (Pt is experiencing dementia.)             Substance Abuse Education Offered: No

## 2021-05-14 NOTE — Progress Notes (Signed)
Mobility Specialist Progress Note:   05/14/21 1000  Mobility  Activity Ambulated in room;Transferred:  Bed to chair;Transferred to/from [facility]  Level of Assistance Moderate assist, patient does 50-74%  Assistive Device BSC;Front wheel walker  Distance Ambulated (ft) 8 ft  Mobility Out of bed for toileting;Ambulated with assistance in room  Mobility Response Tolerated well  Mobility performed by Mobility specialist  Bed Position Chair  $Mobility charge 1 Mobility   Pt independent with bed mobility, required modA to stand from EOB. Transferred to [facility] to void, then amb to chair. Pt up in chair with chair alarm on.   Nelta Numbers Mobility Specialist  Phone [phone]

## 2021-05-14 NOTE — Progress Notes (Signed)
Mobility Specialist Progress Note:   05/14/21 1700  Mobility  Activity Ambulated to bathroom  Level of Assistance Moderate assist, patient does 50-74%  Assistive Device Front wheel walker  Distance Ambulated (ft) 20 ft  Mobility Out of bed for toileting  Mobility Response Tolerated well  Mobility performed by Mobility specialist  $Mobility charge 1 Mobility   Pt requesting to go to BR. BM successful. Required modA to stand from toilet. Pt asx during ambulation.   Nelta Numbers Mobility Specialist  Phone [phone]

## 2021-05-14 NOTE — Consult Note (Signed)
Cardiology Consultation:   Patient ID: Deborah Shields MRN: [id number]; DOB: 1940-08-02  Admit date: 05/12/2021 Date of Consult: 05/14/2021  PCP:  Lajean Manes, MD   [facility] Providers Cardiologist:  Sanda Klein, MD  EP: Dr. Lovena Le  Patient Profile:   Deborah Shields is a 80 y.o. female with a hx of  Paroxysmal AFib, tachy/brady with post termination pauses >> PPM, HTN, HLD,  carotid artery disease due to fibromuscular dysplasia, CKD (IIIb-IV) who is being seen 05/14/2021 for the evaluation of prolonged QT at the request of Dr. Louanne Belton.   Device information SJM dual chamber PPM implanted 09/06/2019  History of Present Illness:   Deborah Shields  last sw Dr. Loletha Grayer June 2021, at that time noted via her device some episodes of rapid Afib though low burden her flecainide continued unchanged, her metoprolol succ increased to 50mg     She had a brief hospitalization 09/18/20 with CP and HTN occ, BP controlled and planned for outpt ischemic w/u with atypical symptoms and suspect 2/2 HTN.  I don't see out pt cardiology f/u since then.   01/23/21 she was referred to the ER from the office (device clinic) with an ongoing episode of Afib w/RVR.  Noted 01/21/21 to have what appeared to be Aflutter with 1:1 She was seen by EP, Dr. Rayann Heman She in route with EMS had spontaneous conversion back to SR with insertion of her IV, she was feeling better Recommended increase Metoprolol from 50mg  > 100mg  daily and her flecainide increased to 75mg  BID Planned for Afib clinic f/u  I don't see AFib clinic, EP, or cardiology f/u since then  She was admitted 03/13/21 w/AMS, fall w/left hip fracture (managed non-operatively) and findings suspicious for colitis/proctitis, UTI, metabolic encephalopathy, AKI/CKD Mention of QT prolongation with plans to avoid qt prolonging agents and stay on flecainide Likely baseline dementia Discharged 03/19/21 to SNF  Admitted THIS admission 05/12/21 for fall w/head  trauma, described as mechanical hit her head CT head neg for acute changes/injury Daughter reported progressive symptoms of dementia with recent hallucinations as well Planned at least for now to continue Eliquis AKI/CKD getting IVF with some improvement She is DNR [facility] cardiology consulted for concerns of worsening QT prolongation Her flecainide was stopped Cardiologist noted "device interrogation her intrinsic conduction is sinus bradycardia 35-55 bpm. Atrial threshold 2.0 V at 0.5 ms. Ventricular threshold 1.0 V at 0.5 ms. ECG obtained without pacing with PR 276 and QRS 158 ms both which are increased from March 12, 2021 when she was in sinus rhythm and had PR of 197 a QRS of 123 ms" Agreed with stopping flecainide Noted  He not think that her presentation is reflective of flecainide toxicity, she has only had 3 episodes of recent AF from 4 to 6 seconds and no episodes of sustained atrial fibrillation recently so her burden appears to be fairly low. And recommended consideration for amiodarone if AF burden increases  She did get a single dose of Zofran yesterday early AM, was stopped  EP is asked to weigh in  K+ 4.6 Mag 1.9 BUN/Creat 30/2.50 (her recent baseline probably about 1.9) WBC 8.2 H/H 11.9/37 Plts 241  TSH 14.148  The patient is comfortable this AM, she recounts exactly as the chart noted, a slip and hit her head, no fall, no LOC She denies any CP, palpitations or cardiac awareness She recalls cardiology visit yesterday and appears well oriented this AM    Past Medical History:  Diagnosis Date   Anxiety  Aortic insufficiency    mild due to degenerative changes   Arthritis    Basal cell carcinoma 2007   [place] [facility] Left leg   Carotid artery occlusion    Chronic kidney disease    CRD Stage 3   Degenerative joint disease    Diverticulosis of colon    Heart murmur    Hyperlipidemia    NMR 07/2009: LDL 200 (2260/1233)TG 99, HDL 65. LDL goal =<120,  ideally <100. father MI @ 44   Hypertension    Hypothyroidism    Dr Wilson Singer   Microscopic hematuria    Peripheral neuropathy    compressive in UE bilaterally; Dr Daylene Katayama   Peripheral vascular disease ([place])    ICA bilat, Dr.Charles fields, [facility]   Pneumonia    2017   PONV (postoperative nausea and vomiting)    "Inner ear, does  okay with Scopolamine"   Pulmonary embolus ([place])    Rectocele    [place] spotted fever     Past Surgical History:  Procedure Laterality Date   ABDOMINAL HYSTERECTOMY  1973   age 32 due to dysfuctional menses; HRT x 25-30 years   APPENDECTOMY  1952   basal cell cancer  03/2006   leg   BILATERAL OOPHORECTOMY  1990   prophylactically (sister had ovarian ca)   BRONCHIAL BRUSHINGS  08/30/2019   Procedure: BRONCHIAL BRUSHINGS;  Surgeon: Candee Furbish, MD;  Location: [facility];  Service: Endoscopy;;   BRONCHIAL WASHINGS  08/30/2019   Procedure: BRONCHIAL WASHINGS;  Surgeon: Candee Furbish, MD;  [facility];  Service: Endoscopy;;   CARPAL TUNNEL RELEASE Bilateral 1989   right   CATARACT EXTRACTION, BILATERAL  2010   Dr Kathrin Penner   CHOLECYSTECTOMY  2006   COLONOSCOPY     Dr Ardis Hughs   EYE SURGERY Left    Bilateral eye (film removed lt eye 01/09/17)   LUMBAR LAMINECTOMY/DECOMPRESSION MICRODISCECTOMY Left 03/20/2017   Procedure: LEFT LUMBAR FOURLUMBAR FIVE LAMINOTOMY AND MICROICRODISCECTOMY 1 LEVEL;  Surgeon: Jovita Gamma, MD;  Location: [place];  Service: Neurosurgery;  Laterality: Left;  LEFT   NM MYOVIEW LTD  06/13/2008   low risk scan   ORTHOPEDIC SURGERY  1989/200/2002/2012   elbows,shoulder surgery x 3, right hand   PACEMAKER IMPLANT N/A 09/06/2019   Procedure: PACEMAKER IMPLANT;  Surgeon: Evans Lance, MD;  Location: [place] [facility];  Service: Cardiovascular;  Laterality: N/A;   RECTOCELE REPAIR  2016   TONSILLECTOMY  1957   US ECHOCARDIOGRAPHY  05/01/2010   trace MR,AI,TR;EF =>55%   VIDEO BRONCHOSCOPY Bilateral 07/17/2017    Procedure: VIDEO BRONCHOSCOPY WITHOUT FLUORO;  Surgeon: Rigoberto Noel, MD;  [facility];  Service: Cardiopulmonary;  Laterality: Bilateral;   VIDEO BRONCHOSCOPY Bilateral 09/02/2017   Procedure: VIDEO BRONCHOSCOPY WITHOUT FLUORO;  Surgeon: Rigoberto Noel, MD;  [facility];  Service: Cardiopulmonary;  Laterality: Bilateral;   VIDEO BRONCHOSCOPY N/A 08/30/2019   Procedure: VIDEO BRONCHOSCOPY WITHOUT FLUORO;  Surgeon: Candee Furbish, MD;  [facility];  Service: Endoscopy;  Laterality: N/A;     Home Medications:  Prior to Admission medications   Medication Sig Start Date End Date Taking? Authorizing Provider  apixaban (ELIQUIS) 2.5 MG TABS tablet Take 1 tablet (2.5 mg total) by mouth 2 (two) times daily. APPOINTMENT NEEDED FOR FURTHER REFILLS. CONTACT YOUR CARDIOLOGIST Patient taking differently: Take 2.5 mg by mouth 2 (two) times daily. 02/15/21  Yes Croitoru, Mihai, MD  B Complex-C (B-COMPLEX WITH VITAMIN C) tablet  Take 1 tablet by mouth daily after supper. 03/17/21  Yes Antonieta Pert, MD  Ca Carbonate-Mag Hydroxide 400-135 MG/5ML SUSP Take 5 mLs by mouth daily as needed (constipation).   Yes [provider]  cholecalciferol (VITAMIN D3) 25 MCG (1000 UNIT) tablet Take 1,000 Units by mouth daily.   Yes [provider]  cyclobenzaprine (FLEXERIL) 10 MG tablet Take 10 mg by mouth every 12 (twelve) hours as needed for muscle spasms.   Yes [provider]  flecainide (TAMBOCOR) 50 MG tablet Take 1.5 tablets (75 mg total) by mouth 2 (two) times daily. TAKE 1 TABLET(50 MG) BY MOUTH EVERY 12 HOURS Patient taking differently: Take 75 mg by mouth every 12 (twelve) hours. 01/23/21  Yes Baldwin Jamaica, PA-C  hydrALAZINE (APRESOLINE) 25 MG tablet Take 1 tablet (25 mg total) by mouth every 8 (eight) hours as needed (if systolic bp more than 270 mmhg). 09/19/20 09/19/21 Yes Shelly Coss, MD  levothyroxine (SYNTHROID) 88 MCG tablet Take 88 mcg by mouth daily  before breakfast.   Yes [provider]  meclizine (ANTIVERT) 25 MG tablet Take 1 tablet (25 mg total) by mouth daily. 1/2 every 8 hrs prn for imbalance Patient taking differently: Take 12.5 mg by mouth 2 (two) times daily. 11/01/10  Yes Hendricks Limes, MD  metoprolol succinate (TOPROL-XL) 100 MG 24 hr tablet Take 1 tablet (100 mg total) by mouth daily. Take with or immediately following a meal. 01/23/21 10/20/21 Yes Baldwin Jamaica, PA-C  senna (SENOKOT) 8.6 MG tablet Take 1 tablet by mouth every other day.    Yes [provider]  traMADol (ULTRAM) 50 MG tablet Take 0.5-1 tablets (25-50 mg total) by mouth 3 (three) times daily as needed for up to 4 doses for moderate pain. Patient taking differently: Take 25-50 mg by mouth every 8 (eight) hours as needed for severe pain or moderate pain. 03/19/21  Yes Antonieta Pert, MD  feeding supplement (ENSURE ENLIVE / ENSURE PLUS) LIQD Take 237 mLs by mouth 2 (two) times daily between meals. Patient not taking: Reported on 05/12/2021 03/17/21   Antonieta Pert, MD    Inpatient Medications: Scheduled Meds:  apixaban  2.5 mg Oral BID   docusate sodium  100 mg Oral BID   levothyroxine  100 mcg Oral QAC breakfast   metoprolol succinate  100 mg Oral Daily   senna  1 tablet Oral QODAY   sodium chloride flush  3 mL Intravenous Q12H   Continuous Infusions:  lactated ringers 100 mL/hr at 05/14/21 0444   valproate sodium     PRN Meds: acetaminophen **OR** acetaminophen, bisacodyl, hydrALAZINE, meclizine, oxyCODONE, polyethylene glycol, traMADol, valproate sodium  Allergies:    Allergies  Allergen Reactions   Penicillins Itching, Swelling and Other (See Comments)    REACTION: itching and edema   Aspirin Nausea Only and Other (See Comments)    REACTION: GI upset  ( pt can take 81 mg but NOT   325 mg ASA )   Lovastatin Nausea Only and Other (See Comments)    REACTION: nausea   Benazepril Hcl Other (See Comments)    Unknown   Colesevelam Hcl  Other (See Comments)   Doxycycline Hyclate Other (See Comments)   Febuxostat Other (See Comments)    Other reaction(s): angioedema   Hydrocodone-Acetaminophen Other (See Comments)    Other reaction(s): irritable   Losartan Other (See Comments)    unknown   Paroxetine Other (See Comments)    Unknown   Statins Depletion [Acid  Blockers Support] Other (See Comments)   Tape Other (See Comments)   Valacyclovir Hcl Nausea And Vomiting   Bupropion Hcl Other (See Comments)    REACTION: tinnitis   Ezetimibe Nausea And Vomiting and Other (See Comments)    REACTION: GI symptoms   Fenofibrate Other (See Comments)    Myalgia    Pravastatin Sodium Other (See Comments)    REACTION: elevated CPK - Muscle's in bilateral Leg    Social History:   Social History   Socioeconomic History   Marital status: Widowed    Spouse name: Not on file   Number of children: 1   Years of education: Not on file   Highest education level: Not on file  Occupational History   Occupation: retired  Tobacco Use   Smoking status: Never   Smokeless tobacco: Never  Vaping Use   Vaping Use: Never used  Substance and Sexual Activity   Alcohol use: No    Alcohol/week: 0.0 standard drinks   Drug use: No   Sexual activity: Not on file  Other Topics Concern   Not on file  Social History Narrative   Low cholesterol diet   Regular exercise- yes    Social Determinants of Health   Financial Resource Strain: Not on file  Food Insecurity: Not on file  Transportation Needs: Not on file  Physical Activity: Not on file  Stress: Not on file  Social Connections: Not on file  Intimate Partner Violence: Not on file    Family History:   Family History  Problem Relation Age of Onset   Heart attack Father 79   Heart disease Father        Before age 105   Colon cancer Mother 23   Stroke Mother 70   Kidney disease Mother        ? renal calculi; S/P resecton of kidney   Cancer - Colon Mother 30   Ovarian cancer  Sister    Other Sister        Valve replacement (aortic & mitral ) in 2 sisters   Heart disease Sister        Before age 52   Heart disease Brother        aortic & mitral valve replacement in 2 bro; both had CBAG   Diabetes Neg Hx    Breast cancer Neg Hx      ROS:  Please see the history of present illness.  All other ROS reviewed and negative.     Physical Exam/Data:   Vitals:   05/13/21 0759 05/13/21 1554 05/13/21 1940 05/14/21 0410  BP: (!) 142/85 129/87 (!) 162/87 (!) 166/110  Pulse: (!) 59 84 88 100  Resp: 16 18 17 16   Temp: 97.7 F (36.5 C) (!) 97.5 F (36.4 C) 97.6 F (36.4 C) (!) 97.5 F (36.4 C)  TempSrc: Oral Oral Oral Oral  SpO2: 100% 100% 98% 100%  Weight:      Height:        Intake/Output Summary (Last 24 hours) at 05/14/2021 0736 Last data filed at 05/14/2021 0444 Gross per 24 hour  Intake 1510.69 ml  Output --  Net 1510.69 ml   Last 3 Weights 05/12/2021 03/18/2021 01/23/2021  Weight (lbs) 100 lb 97 lb 3.6 oz 100 lb  Weight (kg) 45.36 kg 44.1 kg 45.36 kg     Body mass index is 16.64 kg/m.  General:  Well nourished, well developed, in no acute distress HEENT: small bruise L forehead Neck: no JVD  Vascular: No carotid bruits; Distal pulses 2+ bilaterally Cardiac:  RRR; no murmurs, gallops or rubs Lungs:  CTA b/l, no wheezing, rhonchi or rales  Abd: soft, nontender, no hepatomegaly  Ext: no edema Musculoskeletal:  No deformities, quite thin, advanced atrophy Skin: warm and dry  Neuro:  no focal abnormalities noted Psych:  Normal affect   EKG:  The EKG was personally reviewed and demonstrates:     AV pacing, very broad paced QRS duration of 275ms, fused QRS 180-271ms, QT 480/QTc 507 (not accounting for QRS)  #2 is unclear underlying rhythm, 103bpm? ST, machine read QRS 186, QT 494, QTc  647 (not accounting for QRS)  #3Pacig/intrinsic beats, 72bpm, intrinsic QRS 254ms, QT 518ms, QTc 570 (not accounting for QRS)  TODAY Pacaed/intrinsic beats,  68bpm, intrinisc QRS 141ms, 432ms, QTc 511 (not accounting for QRS)   Telemetry:  Telemetry was personally reviewed and demonstrates:    SR and AV pacing  Relevant CV Studies:  09/19/20; TTE IMPRESSIONS   1. Left ventricular ejection fraction, by estimation, is 65 to 70%. The  left ventricle has normal function. The left ventricle has no regional  wall motion abnormalities. There is mild concentric left ventricular  hypertrophy. Left ventricular diastolic  parameters are consistent with Grade II diastolic dysfunction  (pseudonormalization). Elevated left ventricular end-diastolic pressure.   2. Right ventricular systolic function is normal. The right ventricular  size is normal. There is normal pulmonary artery systolic pressure.   3. The mitral valve is normal in structure. Trivial mitral valve  regurgitation. No evidence of mitral stenosis.   4. The aortic valve is tricuspid. There is mild calcification of the  aortic valve. There is mild thickening of the aortic valve. Aortic valve  regurgitation is mild. Mild aortic valve stenosis. Aortic regurgitation  PHT measures 430 msec. Aortic valve  area, by VTI measures 1.22 cm. Aortic valve mean gradient measures 10.0  mmHg. Aortic valve Vmax measures 2.16 m/s.   5. The inferior vena cava is normal in size with greater than 50%  respiratory variability, suggesting right atrial pressure of 3 mmHg.   Laboratory Data:  High Sensitivity Troponin:  No results for input(s): TROPONINIHS in the last 720 hours.   Chemistry Recent Labs  Lab 05/12/21 0714 05/13/21 0707 05/13/21 1320 05/14/21 0216  NA 138 138  --  136  K 3.9 3.8  --  4.6  CL 100 104  --  106  CO2 27 26  --  20*  GLUCOSE 114* 118*  --  92  BUN 33* 31*  --  30*  CREATININE 2.83* 2.46*  --  2.50*  CALCIUM 9.3 8.9  --  9.2  MG  --   --  2.2 1.9  GFRNONAA 16* 19*  --  19*  ANIONGAP 11 8  --  10    No results for input(s): PROT, ALBUMIN, AST, ALT, ALKPHOS, BILITOT in  the last 168 hours. Lipids No results for input(s): CHOL, TRIG, HDL, LABVLDL, LDLCALC, CHOLHDL in the last 168 hours.  Hematology Recent Labs  Lab 05/12/21 0714 05/13/21 0707 05/14/21 0216  WBC 8.8 9.6 8.2  RBC 4.37 4.08 4.01  HGB 13.2 12.2 11.9*  HCT 41.7 38.4 37.5  MCV 95.4 94.1 93.5  MCH 30.2 29.9 29.7  MCHC 31.7 31.8 31.7  RDW 15.9* 15.9* 15.9*  PLT 304 269 241   Thyroid  Recent Labs  Lab 05/13/21 1320  TSH 14.148*    BNPNo results for input(s): BNP, PROBNP in the last  168 hours.  DDimer No results for input(s): DDIMER in the last 168 hours.   Radiology/Studies:  CT Head Wo Contrast Result Date: 05/12/2021 CLINICAL DATA:  80 year old female status post fall on Eliquis. Left forehead hematoma. EXAM: CT HEAD WITHOUT CONTRAST TECHNIQUE: Contiguous axial images were obtained from the base of the skull through the vertex without intravenous contrast. COMPARISON:  Brain MRI 06/30/2003.  Head CT 03/12/2021. FINDINGS: Brain: No midline shift, ventriculomegaly, mass effect, evidence of mass lesion, intracranial hemorrhage or evidence of cortically based acute infarction. Patchy and confluent bilateral white matter hypodensity is stable. Stable vascular calcification in the left basal ganglia. Vascular: Calcified atherosclerosis at the skull base. No suspicious intracranial vascular hyperdensity. Skull: Stable and intact. Sinuses/Orbits: Visualized paranasal sinuses and mastoids are clear. Other: Left anterior convexity, supraorbital scalp contusion and hematoma on series 4, image 29 up to 5 mm in thickness. Underlying calvarium intact. Negative other orbit and scalp soft tissues. IMPRESSION: 1. Left scalp soft tissue injury without underlying skull fracture. 2. No acute intracranial abnormality. Stable chronic white matter disease. Electronically Signed   By: Genevie Ann M.D.   On: 05/12/2021 07:49   DG CHEST PORT 1 VIEW Result Date: 05/13/2021 CLINICAL DATA:  Altered mental status EXAM:  PORTABLE CHEST 1 VIEW COMPARISON:  03/12/2021 FINDINGS: Cardiomegaly with left chest multi lead pacer. Unchanged biapical pleuroparenchymal scarring. No acute airspace opacity. The visualized skeletal structures are unremarkable. IMPRESSION: Cardiomegaly. No acute abnormality of the lungs in AP portable projection. Electronically Signed   By: Delanna Ahmadi M.D.   On: 05/13/2021 09:18     Assessment and Plan:   Paroxysmal Afib CHA2DS2Vasc is 4, on eliquis, appropriately dosed at 2.5mg  BID  I do not appreciate Atrial depolarization on EKG w/A pacing and occassionally on telemetry as well. 2 episodes of  non capture on telemetry are noted A and V Dr. Renella Cunas yesterday checked device and noted thresholds so presumably capture occurred. I will review with industry perhaps we can review yesterday's evaluation on the programmer otherwise will have device rechecked today  Given QRS duration, QTc not as long as machine reads, though by card noted off pacing, all intervals have lengthened.  I don not disagree with stopping flecainide with advancing age, and lengthening intervals She will have AF eventually TSH is high (on synthroid) will defer to IM team for [facility] is her next choice, will need to see what her QT does  Dr. Quentin Ore will see later today     Risk Assessment/Risk Scores:  {  For questions or updates, please contact [facility] Please consult [email or web address] for contact info under    Signed, Baldwin Jamaica, PA-C  05/14/2021 7:36 AM

## 2021-05-14 NOTE — TOC Initial Note (Signed)
Transition of Care [facility]) - Initial/Assessment Note    Patient Details  Name: Deborah Shields MRN: [id number] Date of Birth: 14-Dec-1940  Transition of [facility] CM/SW Contact:    Emeterio Reeve, LCSW Phone Number: 05/14/2021, 1:42 PM  Clinical Narrative:                  CSW spoke to pts daughter [name] by phone. [name] states pt has been at [facility] for a couple months, pt started out SNF but transitioned to LTC. [name] stated pt will return to [facility].   CSW spoke to Admissions coordinator at [facility]. They are able to accept pt back at [place].   Expected Discharge Plan: Skilled Nursing Facility Barriers to Discharge: Continued Medical Work up   Patient Goals and CMS Choice   CMS Medicare.gov Compare Post Acute Care list provided to:: Patient Choice offered to / list presented to : Patient  Expected Discharge Plan and Services Expected Discharge Plan: [place] arrangements for the past 2 months: [place]                                      Prior Living Arrangements/Services Living arrangements for the past 2 months: [place] Lives with:: Facility Resident Patient language and need for interpreter reviewed:: Yes Do you feel safe going back to the place where you live?: Yes      Need for Family Participation in Patient Care: Yes (Comment) Care giver support system in place?: Yes (comment)   Criminal Activity/Legal Involvement Pertinent to Current Situation/Hospitalization: No - Comment as needed  Activities of Daily Living      Permission Sought/Granted Permission sought to share information with : Case Manager Permission granted to share information with : Yes, Verbal Permission Granted     Permission granted to share info w AGENCY: SNF        Emotional Assessment Appearance:: Appears stated age Attitude/Demeanor/Rapport: Unable to Assess Affect (typically observed): Unable to  Assess Orientation: : Oriented to Self Alcohol / Substance Use: Not Applicable Psych Involvement: No (comment)  Admission diagnosis:  AKI (acute kidney injury) ([place]) [N17.9] Patient Active Problem List   Diagnosis Date Noted   QT prolongation    Chronic a-fib ([place]) 05/12/2021   Pressure injury of skin 03/14/2021   Protein-calorie malnutrition, severe 03/14/2021   UTI (urinary tract infection) 03/13/2021   Colitis 03/13/2021   Closed nondisplaced fracture of greater trochanter of left femur ([place])    AKI (acute kidney injury) ([place]) 01/23/2021   Hypertension, accelerated with heart disease, without CHF 09/19/2020   Atypical chest pain 09/18/2020   SSS (sick sinus syndrome) ([place]) 12/06/2019   Pacemaker 12/06/2019   Hypercholesterolemia 12/06/2019   Underweight 12/06/2019   Pneumonia of right upper lobe due to methicillin resistant Staphylococcus aureus (MRSA) ([place])    Pneumonia due to infectious organism    Community acquired pneumonia 08/16/2018   Malnutrition of moderate degree 07/16/2017   Hemoptysis 07/15/2017   HNP (herniated nucleus pulposus), lumbar 03/20/2017   Edema of both feet 12/10/2016   Tick bite 11/21/2015   Abnormal urine odor 11/21/2015   Other fatigue 11/21/2015   Anxiety 08/15/2015   Encounter for pre-operative cardiovascular clearance 07/10/2015   Pre-operative clearance 07/07/2015   Rotator cuff tear 07/07/2015   Rectocele, female 08/04/2014   COPD with chronic bronchitis and emphysema ([place]) 02/14/2014  Other malaise and fatigue 12/31/2013   PAF (paroxysmal atrial fibrillation) ([place]) 01/10/2013   Thrombosed external hemorrhoid 11/26/2012   Stage 4 chronic kidney disease ([place]) 09/03/2011   Atherosclerosis of native arteries of the extremities with intermittent claudication 04/04/2011   Occlusion and stenosis of carotid artery without mention of cerebral infarction 04/04/2011   PERIPHERAL NEUROPATHY 08/15/2008   Hypothyroidism (acquired) 08/12/2007    Hyperlipidemia with target LDL less than 100 08/12/2007   Essential hypertension 08/12/2007   Osteopenia 08/12/2007   Carotid artery stenosis, asymptomatic 08/11/2007   Osteoarthritis 08/11/2007   BASAL CELL CARCINOMA, HX OF 08/11/2007   FIBROCYSTIC BREAST DISEASE 11/18/2006   DIVERTICULOSIS, COLON 08/10/2006   PCP:  Lajean Manes, MD Pharmacy:  No Pharmacies Listed    Social Determinants of Health (SDOH) Interventions    Readmission Risk Interventions No flowsheet data found.  Emeterio Reeve, LCSW Clinical Social Worker

## 2021-05-15 DIAGNOSIS — E785 Hyperlipidemia, unspecified: Secondary | ICD-10-CM | POA: Diagnosis not present

## 2021-05-15 DIAGNOSIS — I119 Hypertensive heart disease without heart failure: Secondary | ICD-10-CM | POA: Diagnosis not present

## 2021-05-15 DIAGNOSIS — I482 Chronic atrial fibrillation, unspecified: Secondary | ICD-10-CM | POA: Diagnosis not present

## 2021-05-15 DIAGNOSIS — N179 Acute kidney failure, unspecified: Secondary | ICD-10-CM | POA: Diagnosis not present

## 2021-05-15 LAB — CBC
HCT: 37.9 % (ref 36.0–46.0)
Hemoglobin: 11.9 g/dL — ABNORMAL LOW (ref 12.0–15.0)
MCH: 29.6 pg (ref 26.0–34.0)
MCHC: 31.4 g/dL (ref 30.0–36.0)
MCV: 94.3 fL (ref 80.0–100.0)
Platelets: 245 10*3/uL (ref 150–400)
RBC: 4.02 MIL/uL (ref 3.87–5.11)
RDW: 15.9 % — ABNORMAL HIGH (ref 11.5–15.5)
WBC: 7.8 10*3/uL (ref 4.0–10.5)
nRBC: 0 % (ref 0.0–0.2)

## 2021-05-15 LAB — BASIC METABOLIC PANEL
Anion gap: 11 (ref 5–15)
BUN: 29 mg/dL — ABNORMAL HIGH (ref 8–23)
CO2: 21 mmol/L — ABNORMAL LOW (ref 22–32)
Calcium: 9.1 mg/dL (ref 8.9–10.3)
Chloride: 104 mmol/L (ref 98–111)
Creatinine, Ser: 2.19 mg/dL — ABNORMAL HIGH (ref 0.44–1.00)
GFR, Estimated: 22 mL/min — ABNORMAL LOW (ref >60)
Glucose, Bld: 86 mg/dL (ref 70–99)
Potassium: 4.2 mmol/L (ref 3.5–5.1)
Sodium: 136 mmol/L (ref 135–145)

## 2021-05-15 MED ORDER — HYDRALAZINE HCL 20 MG/ML IJ SOLN
5.0000 mg | Freq: Once | INTRAMUSCULAR | Status: AC
  Administered 2021-05-15: 5 mg via INTRAVENOUS
  Filled 2021-05-15: qty 1

## 2021-05-15 NOTE — Plan of Care (Signed)
?  Problem: Clinical Measurements: ?Goal: Will remain free from infection ?Outcome: Progressing ?  ?

## 2021-05-15 NOTE — Progress Notes (Signed)
  Mobility Specialist Criteria Algorithm Info.  Mobility Team: HOB elevated: Activity: Ambulated in room; Ambulated to bathroom Range of motion: Active; All extremities Level of assistance: Moderate assist, patient does 50-74% Assistive device: Front wheel walker Distance ambulated (ft): 20 ft Mobility response: Tolerated well Bed Position: Semi-fowlers  Patient received in bed asleep, requesting assistance to bathroom. Required min A to EOB and Mod A to stand + cues for hand placement. Ambulated to bathroom min A to steady. Required Mod A to stand from toilet. Declined further ambulation and requested to get back in bed. Pt suggested to return later after lunch to sit in chair, will return as time permits. Was left in bed with all needs met and call bell in reach.   05/15/2021 11:54 AM

## 2021-05-15 NOTE — Progress Notes (Signed)
PROGRESS NOTE    Deborah Shields  PJA:[id number] DOB: May 13, 1941 DOA: 05/12/2021 PCP: Lajean Manes, MD  Brief Narrative:   Patient is 80 years old female with past medical history of CKD stage III, hypertension, hyperlipidemia, hypothyroidism, atrial fibrillation on Eliquis, DVT pleasant 80 year old female history of CKD stage III, hypertension, hyperlipidemia, hypothyroidism, pacemaker placement, A. fib on Eliquis, PVD and pulmonary embolism presented to hospital with altered mental status acute kidney injury concerning for volume depletion.  Patient was noted to have a hematoma on the head with negative head CT scan.  Patient was noted to have creatinine of 2.8 previously noted at 1.4 in daughter had reported confusion.  Capacity evaluation was also asked by the patient's daughter.  Patient was started on IV fluids and was admitted hospital for further evaluation and treatment.    Assessment & Plan:   Principal Problem:   AKI (acute kidney injury) ([place]) Active Problems:   Hypothyroidism (acquired)   Hyperlipidemia with target LDL less than 100   Stage 4 chronic kidney disease ([place])   Hypertension, accelerated with heart disease, without CHF   Chronic a-fib ([facility])   QT prolongation  Acute kidney injury on chronic kidney disease stage IV Baseline creatinine of around 1.4.  Patient  presented with acute kidney injury with a creatinine of 2.8 on presentation.  UA showed trace leukocytes.  Continue supportive care.  Creatinine improving with hydration.  Creatinine at 2.1 at this time and is trending down.  We will continue to monitor..  Volume depletion/dehydration Continue with IV fluids for now.  Check BMP in AM.   Hypothyroidism Continue Synthroid.  TSH of 14.1.  Patient was on 88 mcg at home.  Has been changed to 100 mcg at this time.  Superficial hematoma on the head Status post fall.  CT head scan was negative for acute findings.  Possible dementia with possible delusional  behavior Patient is alert awake oriented at this time but has been having some issues at home.  Psychiatry was consulted at the family's request and patient does not have a capacity to  make medical decisions at this time.  Depakote as needed until QTC prolongation is is resolved  QTC prolongation Improved QTC on repeat EKG.  Replace electrolytes as necessary.  Electrophysiology cardiology on board.  Paroxysmal atrial fibrillation Continue Eliquis.  CHA2DS2-VASc score of 4.  Continue Toprol-XL.  Flecainide has been discontinued due to QTC prolongation.  Cardiology has been recommended to discontinue flecainide and continue metoprolol.  Hypertension -Continue hydralazine, Toprol-XL.  Hypothyroidism Continue with Synthroid.  Patient was on 88 mcg at home and this has been increased 100 mcg due to elevated TSH.  DVT prophylaxis: Eliquis  Code Status: DNR  Family Communication:  I spoke with the patient's daughter on the phone on 05/14/2021.  Disposition: Skilled nursing facility as per PT recommendation.  Status is: Inpatient  The patient is inpatient because: Severity of illness, need for skilled nursing facility placement.   Consultants:  Psychiatry  Cardiology  Procedures:  CT head without contrast 05/12/2021 Chest x-ray 05/13/2021  Antimicrobials:  None  Subjective: Today, patient was seen and examined at bedside.  Feels anxious and could not sleep well yesterday.  Denies any chest pain, nausea, vomiting.   Objective: Vitals:   05/15/21 0200 05/15/21 0409 05/15/21 0505 05/15/21 0812  BP: (!) 165/85 (!) 163/80 (!) 158/82 (!) 169/94  Pulse: (!) 58 (!) 57 62 (!) 57  Resp:  17  16  Temp:  97.8 F (36.6  C)  97.6 F (36.4 C)  TempSrc:    Oral  SpO2:  95%  95%  Weight:  47.3 kg    Height:        Intake/Output Summary (Last 24 hours) at 05/15/2021 1200 Last data filed at 05/15/2021 9326 Gross per 24 hour  Intake 2062.93 ml  Output 1650 ml  Net 412.93 ml     Filed Weights   May 18, 2021 0703 05/15/21 0409  Weight: 45.4 kg 47.3 kg   Body mass index is 17.35 kg/m.   Physical examination: General: Thinly built, frail, alert awake and communicative.   HEENT:   No scleral pallor or icterus noted. Oral mucosa is moist.  Chest: Diminished breath sounds bilaterally. CVS: S1 &S2 heard. No murmur.  Irregular rhythm. Abdomen: Soft, nontender, nondistended.  Bowel sounds are heard.   Extremities: No cyanosis, clubbing or edema.  Peripheral pulses are palpable. Psych: Alert, awake and communicative, oriented, normal mood CNS:  No cranial nerve deficits.  Moves all extremities. Skin: Warm and dry.  No rashes noted.  Data Reviewed:  I have personally reviewed the following labs and imaging studies.   CBC: Recent Labs  Lab May 18, 2021 0714 05/13/21 0707 05/14/21 0216 05/15/21 0115  WBC 8.8 9.6 8.2 7.8  NEUTROABS 6.3  --   --   --   HGB 13.2 12.2 11.9* 11.9*  HCT 41.7 38.4 37.5 37.9  MCV 95.4 94.1 93.5 94.3  PLT 304 269 241 245     Basic Metabolic Panel: Recent Labs  Lab 04/28/2021 0714 05/13/21 0707 05/13/21 1320 05/14/21 0216 05/15/21 0115  NA 138 138  --  136 136  K 3.9 3.8  --  4.6 4.2  CL 100 104  --  106 104  CO2 27 26  --  20* 21*  GLUCOSE 114* 118*  --  92 86  BUN 33* 31*  --  30* 29*  CREATININE 2.83* 2.46*  --  2.50* 2.19*  CALCIUM 9.3 8.9  --  9.2 9.1  MG  --   --  2.2 1.9  --      GFR: Estimated Creatinine Clearance: 14.8 mL/min (A) (by C-G formula based on SCr of 2.19 mg/dL (H)).  Liver Function Tests: No results for input(s): AST, ALT, ALKPHOS, BILITOT, PROT, ALBUMIN in the last 168 hours.  CBG: No results for input(s): GLUCAP in the last 168 hours.   Recent Results (from the past 240 hour(s))  Urine Culture     Status: None   Collection Time: 05/06/2021 10:20 AM   Specimen: In/Out Cath Urine  Result Value Ref Range Status   Specimen Description IN/OUT CATH URINE  Final   Special Requests NONE  Final    Culture   Final    NO GROWTH Performed at [facility], [address] [place], [place] [address]    Report Status 05/13/2021 FINAL  Final  Resp Panel by RT-PCR (Flu A&B, Covid) Nasopharyngeal Swab     Status: None   Collection Time: 18-May-2021  1:31 PM   Specimen: Nasopharyngeal Swab; Nasopharyngeal(NP) swabs in vial transport medium  Result Value Ref Range Status   SARS Coronavirus 2 by RT PCR NEGATIVE NEGATIVE Final    Comment: (NOTE) SARS-CoV-2 target nucleic acids are NOT DETECTED.  The SARS-CoV-2 RNA is generally detectable in upper respiratory specimens during the acute phase of infection. The lowest concentration of SARS-CoV-2 viral copies this assay can detect is 138 copies/mL. A negative result does not preclude SARS-Cov-2 infection and  should not be used as the sole basis for treatment or other patient management decisions. A negative result may occur with  improper specimen collection/handling, submission of specimen other than nasopharyngeal swab, presence of viral mutation(s) within the areas targeted by this assay, and inadequate number of viral copies(<138 copies/mL). A negative result must be combined with clinical observations, patient history, and epidemiological information. The expected result is Negative.  Fact Sheet for Patients:  EntrepreneurPulse.com.au  Fact Sheet for Healthcare Providers:  IncredibleEmployment.be  This test is no t yet approved or cleared by the [place] FDA and  has been authorized for detection and/or diagnosis of SARS-CoV-2 by FDA under an Emergency Use Authorization (EUA). This EUA will remain  in effect (meaning this test can be used) for the duration of the COVID-19 declaration under Section 564(b)(1) of the Act, 21 U.S.C.section 360bbb-3(b)(1), unless the authorization is terminated  or revoked sooner.       Influenza A by PCR NEGATIVE NEGATIVE Final   Influenza B by PCR NEGATIVE  NEGATIVE Final    Comment: (NOTE) The Xpert Xpress SARS-CoV-2/FLU/RSV plus assay is intended as an aid in the diagnosis of influenza from Nasopharyngeal swab specimens and should not be used as a sole basis for treatment. Nasal washings and aspirates are unacceptable for Xpert Xpress SARS-CoV-2/FLU/RSV testing.  Fact Sheet for Patients: EntrepreneurPulse.com.au  Fact Sheet for Healthcare Providers: IncredibleEmployment.be  This test is not yet approved or cleared by the [place] FDA and has been authorized for detection and/or diagnosis of SARS-CoV-2 by FDA under an Emergency Use Authorization (EUA). This EUA will remain in effect (meaning this test can be used) for the duration of the COVID-19 declaration under Section 564(b)(1) of the Act, 21 U.S.C. section 360bbb-3(b)(1), unless the authorization is terminated or revoked.  Performed at [facility], [place] [address], [place], [place] [address]   Urine Culture     Status: Abnormal   Collection Time: 05/13/21 10:45 PM   Specimen: Urine, Clean Catch  Result Value Ref Range Status   Specimen Description URINE, CLEAN CATCH  Final   Special Requests NONE  Final   Culture (A)  Final    <10,000 COLONIES/mL INSIGNIFICANT GROWTH Performed at [facility], [place] [address] [place], [place] [address]    Report Status 05/14/2021 FINAL  Final  MRSA Next Gen by PCR, Nasal     Status: Abnormal   Collection Time: 05/14/21  2:17 PM   Specimen: Nasal Mucosa; Nasal Swab  Result Value Ref Range Status   MRSA by PCR Next Gen DETECTED (A) NOT DETECTED Final    Comment: RESULT CALLED TO, READ BACK BY AND VERIFIED WITH: Marjory Sneddon RN [id number] 05/14/21 A BROWNING (NOTE) The GeneXpert MRSA Assay (FDA approved for NASAL specimens only), is one component of a comprehensive MRSA colonization surveillance program. It is not intended to diagnose MRSA infection nor to guide or monitor treatment for MRSA  infections. Test performance is not FDA approved in patients less than 26 years old. Performed at [facility], [place] [address], [place], [place] [address]      Radiology Studies: No results found.    Scheduled Meds:  apixaban  2.5 mg Oral BID   docusate sodium  100 mg Oral BID   feeding supplement  1 Container Oral TID BM   levothyroxine  100 mcg Oral QAC breakfast   melatonin  3 mg Oral QHS   metoprolol succinate  100 mg Oral Daily  mupirocin ointment  1 application Nasal BID   senna  1 tablet Oral QODAY   sodium chloride flush  3 mL Intravenous Q12H   Continuous Infusions:  lactated ringers 100 mL/hr at 05/15/21 0959   valproate sodium       LOS: 2 days    Flora Lipps, MD [facility] 05/15/2021, 12:00 PM

## 2021-05-16 DIAGNOSIS — N179 Acute kidney failure, unspecified: Secondary | ICD-10-CM | POA: Diagnosis not present

## 2021-05-16 DIAGNOSIS — I119 Hypertensive heart disease without heart failure: Secondary | ICD-10-CM | POA: Diagnosis not present

## 2021-05-16 DIAGNOSIS — I482 Chronic atrial fibrillation, unspecified: Secondary | ICD-10-CM | POA: Diagnosis not present

## 2021-05-16 DIAGNOSIS — E785 Hyperlipidemia, unspecified: Secondary | ICD-10-CM | POA: Diagnosis not present

## 2021-05-16 LAB — BASIC METABOLIC PANEL
Anion gap: 9 (ref 5–15)
BUN: 25 mg/dL — ABNORMAL HIGH (ref 8–23)
CO2: 25 mmol/L (ref 22–32)
Calcium: 9.3 mg/dL (ref 8.9–10.3)
Chloride: 105 mmol/L (ref 98–111)
Creatinine, Ser: 2.17 mg/dL — ABNORMAL HIGH (ref 0.44–1.00)
GFR, Estimated: 22 mL/min — ABNORMAL LOW (ref >60)
Glucose, Bld: 93 mg/dL (ref 70–99)
Potassium: 4 mmol/L (ref 3.5–5.1)
Sodium: 139 mmol/L (ref 135–145)

## 2021-05-16 LAB — CBC
HCT: 35.4 % — ABNORMAL LOW (ref 36.0–46.0)
Hemoglobin: 11.4 g/dL — ABNORMAL LOW (ref 12.0–15.0)
MCH: 29.8 pg (ref 26.0–34.0)
MCHC: 32.2 g/dL (ref 30.0–36.0)
MCV: 92.7 fL (ref 80.0–100.0)
Platelets: 242 10*3/uL (ref 150–400)
RBC: 3.82 MIL/uL — ABNORMAL LOW (ref 3.87–5.11)
RDW: 16 % — ABNORMAL HIGH (ref 11.5–15.5)
WBC: 7.1 10*3/uL (ref 4.0–10.5)
nRBC: 0 % (ref 0.0–0.2)

## 2021-05-16 LAB — MAGNESIUM: Magnesium: 1.7 mg/dL (ref 1.7–2.4)

## 2021-05-16 MED ORDER — MAGNESIUM SULFATE 2 GM/50ML IV SOLN
2.0000 g | Freq: Once | INTRAVENOUS | Status: AC
  Administered 2021-05-16: 2 g via INTRAVENOUS
  Filled 2021-05-16: qty 50

## 2021-05-16 MED ORDER — MELATONIN 3 MG PO TABS: 3.0000 mg | ORAL_TABLET | Freq: Every evening | ORAL | 0 refills | Status: AC | PRN

## 2021-05-16 MED ORDER — MAGNESIUM 30 MG PO TABS: 30.0000 mg | ORAL_TABLET | Freq: Two times a day (BID) | ORAL | 0 refills | Status: AC

## 2021-05-16 MED ORDER — POLYETHYLENE GLYCOL 3350 17 G PO PACK: 17.0000 g | PACK | Freq: Every day | ORAL | 0 refills | Status: AC | PRN

## 2021-05-16 MED ORDER — CYCLOBENZAPRINE HCL 5 MG PO TABS: 5.0000 mg | ORAL_TABLET | Freq: Two times a day (BID) | ORAL | Status: AC | PRN

## 2021-05-16 MED ORDER — HYDRALAZINE HCL 25 MG PO TABS: 25.0000 mg | ORAL_TABLET | Freq: Three times a day (TID) | ORAL | Status: DC

## 2021-05-16 MED ORDER — ACETAMINOPHEN 325 MG PO TABS: 650.0000 mg | ORAL_TABLET | Freq: Four times a day (QID) | ORAL | Status: AC | PRN

## 2021-05-16 MED ORDER — TRAMADOL HCL 50 MG PO TABS: 50.0000 mg | ORAL_TABLET | Freq: Three times a day (TID) | ORAL | 0 refills | Status: AC | PRN

## 2021-05-16 MED ORDER — DOCUSATE SODIUM 100 MG PO CAPS: 100.0000 mg | ORAL_CAPSULE | Freq: Two times a day (BID) | ORAL | 0 refills | Status: AC

## 2021-05-16 MED ORDER — LEVOTHYROXINE SODIUM 100 MCG PO TABS: 100.0000 ug | ORAL_TABLET | Freq: Every day | ORAL | Status: AC

## 2021-05-16 NOTE — Progress Notes (Signed)
Patient ID: Deborah Shields, female   DOB: 1941-04-21, 80 y.o.   MRN: [id number]  Report called to Lelon Frohlich at [facility]. No further questions at this time. AVS and paperwork placed in discharge folder.    Haydee Salter, RN

## 2021-05-16 NOTE — Progress Notes (Signed)
PT Cancellation Note  Patient Details Name: Deborah Shields MRN: [id number] DOB: Apr 09, 1941   Cancelled Treatment:    Reason Eval/Treat Not Completed: Other (comment).  Pt reports she had pain meds and feels she will not be stable to stand.  Will continue on with her as she allows, as pt declined therapy to remain in her room for gait.     Ramond Dial 05/16/2021, 11:20 AM  Mee Hives, PT PhD Acute Rehab Dept. Number: [place]

## 2021-05-16 NOTE — Care Management Important Message (Signed)
Important Message  Patient Details  Name: Deborah Shields MRN: [id number] Date of Birth: 1941/02/27   Medicare Important Message Given:  Yes     [name] [address] 05/16/2021, 4:10 PM

## 2021-05-16 NOTE — Care Management Important Message (Signed)
Important Message  Patient Details  Name: Deborah Shields MRN: [id number] Date of Birth: [birth year]   Medicare Important Message Given:  Yes     Joetta Manners 05/16/2021, 11:12 AM

## 2021-05-16 NOTE — TOC Transition Note (Addendum)
Transition of Care [facility]) - CM/SW Discharge Note   Patient Details  Name: Deborah Shields MRN: [id number] Date of Birth: [birth year]  [facility] of [facility] CM/SW Contact:  Emeterio Reeve, LCSW Phone Number: 05/16/2021, 11:56 AM   Clinical Narrative:      Per MD patient ready for DC to . RN, patient, patient's family, and facility notified of DC. Discharge Summary and FL2 sent to facility. DC packet on chart. Insurance Josem Kaufmann has been received and pt is covid negative. Ambulance transport requested for patient.    RN to call report to Lelon Frohlich [phone].Room 606  CSW will sign off for now as social work intervention is no longer needed. Please consult [place] again if new needs arise.   Final next level of care: Skilled Nursing Facility Barriers to Discharge: Barriers Resolved   Patient Goals and CMS Choice   CMS Medicare.gov Compare Post Acute Care list provided to:: Patient Choice offered to / list presented to : Patient  Discharge Placement              Patient chooses bed at: [facility] Patient to be transferred to facility by: [facility] Name of family member notified: daughter Patient and family notified of of transfer: 05/16/21  Discharge Plan and Services                                     Social Determinants of Health (SDOH) Interventions     Readmission Risk Interventions No flowsheet data found.   Emeterio Reeve, LCSW Clinical Social Worker

## 2021-05-16 NOTE — Progress Notes (Signed)
Pt said her tooth broke off on the right lower side. She said it feels jagged and it is cutting her gum causing it to bleed. She state that it is urgent for a doctor to look at it.

## 2021-05-16 NOTE — Progress Notes (Signed)
Mobility Specialist Progress Note:   05/16/21 1000  Mobility  Activity Transferred to/from [facility]  Level of Assistance Minimal assist, patient does 75% or more  Assistive Device BSC  Distance Ambulated (ft) 2 ft  Mobility Out of bed for toileting  Mobility Response Tolerated well  Mobility performed by Mobility specialist  $Mobility charge 1 Mobility   Pt required minA to stand from [facility] with use of handrails. Took two steps with HHA back to bed. Will f/u today with hopeful gait training.   Nelta Numbers Mobility Specialist  Phone [phone]

## 2021-05-16 NOTE — Discharge Summary (Signed)
Physician Discharge Summary  Deborah Shields CNO:[id number] DOB: September 13, 1940 DOA: 05/12/2021  PCP: Lajean Manes, MD  Admit date: 05/12/2021 Discharge date: 05/16/2021  Admitted From: Home  Discharge disposition: SNF  Recommendations for Outpatient Follow-Up:   Follow up with your primary care provider at the skilled nursing facility in 3 to 5 days Check CBC, BMP, magnesium in 3 to 5 days.  Goal is to keep magnesium more than 2 if possible. Follow-up with cardiology for pacemaker follow-up.  Electrophysiology Cardiology has been notified in office to set up an appointment. Could change as needed hydralazine to standing hydralazine if needed for blood pressure.  Discharge Diagnosis:   Principal Problem:   AKI (acute kidney injury) ([place]) Active Problems:   Hypothyroidism (acquired)   Hyperlipidemia with target LDL less than 100   Stage 4 chronic kidney disease ([place])   Hypertension, accelerated with heart disease, without CHF   Chronic a-fib ([facility])   QT prolongation   Discharge Condition: Improved.  Diet recommendation: Low sodium, heart healthy.    Wound care: None.  Code status: DNR.   History of Present Illness:   Patient is 80 years old female with past medical history of CKD stage III, hypertension, hyperlipidemia, hypothyroidism, atrial fibrillation on Eliquis, DVT , presented to hospital with altered mental status acute kidney injury concerning for volume depletion.  Patient was noted to have a hematoma on the head with negative head CT scan.  Patient was noted to have creatinine of 2.8 previously noted at 1.4 in daughter had reported confusion.  Capacity evaluation was also asked by the patient's daughter.  Patient was started on IV fluids and was admitted hospital for further evaluation and treatment.    Hospital Course:   Following conditions were addressed during hospitalization as listed below,  Acute kidney injury on chronic kidney disease stage  IV Baseline creatinine of around 1.4.  Patient  presented with acute kidney injury with a creatinine of 2.8 on presentation.  Creatinine has improved to 2.1.  UA showed trace leukocytes.  Continue supportive care.  Patient received IV fluids during hospitalization  Volume depletion/dehydration Received IV fluids during hospitalization.  We will need to check BMP in 3 to 5 days.   Hypothyroidism  TSH of 14.1.  Patient was on 88 mcg at home.  Has been changed to 100 mcg at this time.  We will need to check thyroid function test in 4 to 6 weeks.   Superficial hematoma on the head Status post fall.  CT head scan was negative for acute findings.  Possible dementia with possible delusional behavior Patient is alert awake oriented at this time but has been having some issues at home.  Psychiatry was consulted at the family's request and patient does not have a capacity to  make medical decisions at this time.  Could consider using Depakote 125 mg as needed for agitation.  Not prescribed at this time.  QTC prolongation Improved QTC on repeat EKG. we will continue magnesium for next few days.  Patient was seen by electrophysiology Cardiology who recommend outpatient follow-up with Dr. Lovena Le for pacemaker revision.  Patient has been taken off flecainide.  Keep magnesium above 2.  Patient will be given magnesium supplement on discharge.   Paroxysmal atrial fibrillation Continue Eliquis.  CHA2DS2-VASc score of 4.  Cardiology has been recommended to discontinue flecainide and continue metoprolol due to QTc presentation.  Spoke with the electrophysiology Cardiology prior to discharge for outpatient follow-up.  Hypertension -Continue hydralazine, Toprol-XL.  Disposition.  At this time, patient is stable for disposition to skilled nursing facility.Marland Kitchen  Spoke with the patient's daughter about plan for disposition.  Medical Consultants:   Psychiatry  Cardiology  Procedures:    Pacer  interrogation Subjective:   Today, patient was seen and examined at bedside.  Complains of mild hip pain.  Denies any chest pain, shortness of breath, fever or chills.  Discharge Exam:   Vitals:   05/16/21 0450 05/16/21 0738  BP: (!) 191/98 (!) 174/95  Pulse: (!) 57 (!) 56  Resp: 17 16  Temp: 97.7 F (36.5 C) (!) 97.5 F (36.4 C)  SpO2: 96% 97%   Vitals:   05/15/21 1656 05/15/21 2043 05/16/21 0450 05/16/21 0738  BP: (!) 163/83 (!) 141/66 (!) 191/98 (!) 174/95  Pulse: 61 (!) 53 (!) 57 (!) 56  Resp: 17 16 17 16   Temp: 97.7 F (36.5 C) 98.1 F (36.7 C) 97.7 F (36.5 C) (!) 97.5 F (36.4 C)  TempSrc: Oral Oral Oral Oral  SpO2: 98% 100% 96% 97%  Weight:      Height:       General: Alert awake, not in obvious distress, thinly built, frail, alert awake and communicative HENT: pupils equally reacting to light,  No scleral pallor or icterus noted. Oral mucosa is moist.  Chest:  Clear breath sounds.  Diminished breath sounds bilaterally. No crackles or wheezes.  Pacer in place. CVS: S1 &S2 heard. No murmur.  Regular rhythm Abdomen: Soft, nontender, nondistended.  Bowel sounds are heard.   Extremities: No cyanosis, clubbing or edema.  Peripheral pulses are palpable. Psych: Alert, awake and communicative,, she has mood CNS:  No cranial nerve deficits.  Power equal in all extremities.   Skin: Warm and dry.  No rashes noted.  The results of significant diagnostics from this hospitalization (including imaging, microbiology, ancillary and laboratory) are listed below for reference.     Diagnostic Studies:   CT Head Wo Contrast  Result Date: 05/12/2021 CLINICAL DATA:  80 year old female status post fall on Eliquis. Left forehead hematoma. EXAM: CT HEAD WITHOUT CONTRAST TECHNIQUE: Contiguous axial images were obtained from the base of the skull through the vertex without intravenous contrast. COMPARISON:  Brain MRI 06/30/2003.  Head CT 03/12/2021. FINDINGS: Brain: No midline shift,  ventriculomegaly, mass effect, evidence of mass lesion, intracranial hemorrhage or evidence of cortically based acute infarction. Patchy and confluent bilateral white matter hypodensity is stable. Stable vascular calcification in the left basal ganglia. Vascular: Calcified atherosclerosis at the skull base. No suspicious intracranial vascular hyperdensity. Skull: Stable and intact. Sinuses/Orbits: Visualized paranasal sinuses and mastoids are clear. Other: Left anterior convexity, supraorbital scalp contusion and hematoma on series 4, image 29 up to 5 mm in thickness. Underlying calvarium intact. Negative other orbit and scalp soft tissues. IMPRESSION: 1. Left scalp soft tissue injury without underlying skull fracture. 2. No acute intracranial abnormality. Stable chronic white matter disease. Electronically Signed   By: Genevie Ann M.D.   On: 05/12/2021 07:49   DG CHEST PORT 1 VIEW  Result Date: 05/13/2021 CLINICAL DATA:  Altered mental status EXAM: PORTABLE CHEST 1 VIEW COMPARISON:  03/12/2021 FINDINGS: Cardiomegaly with left chest multi lead pacer. Unchanged biapical pleuroparenchymal scarring. No acute airspace opacity. The visualized skeletal structures are unremarkable. IMPRESSION: Cardiomegaly. No acute abnormality of the lungs in AP portable projection. Electronically Signed   By: Delanna Ahmadi M.D.   On: 05/13/2021 09:18     Labs:   Basic Metabolic Panel: Recent Labs  Lab 05/12/21 0714 05/13/21 0707 05/13/21 1320 05/14/21 0216 05/15/21 0115 05/16/21 0053  NA 138 138  --  136 136 139  K 3.9 3.8  --  4.6 4.2 4.0  CL 100 104  --  106 104 105  CO2 27 26  --  20* 21* 25  GLUCOSE 114* 118*  --  92 86 93  BUN 33* 31*  --  30* 29* 25*  CREATININE 2.83* 2.46*  --  2.50* 2.19* 2.17*  CALCIUM 9.3 8.9  --  9.2 9.1 9.3  MG  --   --  2.2 1.9  --  1.7   GFR Estimated Creatinine Clearance: 14.9 mL/min (A) (by C-G formula based on SCr of 2.17 mg/dL (H)). Liver Function Tests: No results for  input(s): AST, ALT, ALKPHOS, BILITOT, PROT, ALBUMIN in the last 168 hours. No results for input(s): LIPASE, AMYLASE in the last 168 hours. No results for input(s): AMMONIA in the last 168 hours. Coagulation profile No results for input(s): INR, PROTIME in the last 168 hours.  CBC: Recent Labs  Lab 05/12/21 0714 05/13/21 0707 05/14/21 0216 05/15/21 0115 05/16/21 0053  WBC 8.8 9.6 8.2 7.8 7.1  NEUTROABS 6.3  --   --   --   --   HGB 13.2 12.2 11.9* 11.9* 11.4*  HCT 41.7 38.4 37.5 37.9 35.4*  MCV 95.4 94.1 93.5 94.3 92.7  PLT 304 269 241 245 242   Cardiac Enzymes: No results for input(s): CKTOTAL, CKMB, CKMBINDEX, TROPONINI in the last 168 hours. BNP: Invalid input(s): POCBNP CBG: No results for input(s): GLUCAP in the last 168 hours. D-Dimer No results for input(s): DDIMER in the last 72 hours. Hgb A1c No results for input(s): HGBA1C in the last 72 hours. Lipid Profile No results for input(s): CHOL, HDL, LDLCALC, TRIG, CHOLHDL, LDLDIRECT in the last 72 hours. Thyroid function studies Recent Labs    05/13/21 1320  TSH 14.148*   Anemia work up Recent Labs    05/13/21 1320  VITAMINB12 686  FOLATE 21.4   Microbiology Recent Results (from the past 240 hour(s))  Urine Culture     Status: None   Collection Time: 05/12/21 10:20 AM   Specimen: In/Out Cath Urine  Result Value Ref Range Status   Specimen Description IN/OUT CATH URINE  Final   Special Requests NONE  Final   Culture   Final    NO GROWTH Performed at [facility], [address] [place], [place] [address]    Report Status 05/13/2021 FINAL  Final  Resp Panel by RT-PCR (Flu A&B, Covid) Nasopharyngeal Swab     Status: None   Collection Time: 05/12/21  1:31 PM   Specimen: Nasopharyngeal Swab; Nasopharyngeal(NP) swabs in vial transport medium  Result Value Ref Range Status   SARS Coronavirus 2 by RT PCR NEGATIVE NEGATIVE Final    Comment: (NOTE) SARS-CoV-2 target nucleic acids are NOT  DETECTED.  The SARS-CoV-2 RNA is generally detectable in upper respiratory specimens during the acute phase of infection. The lowest concentration of SARS-CoV-2 viral copies this assay can detect is 138 copies/mL. A negative result does not preclude SARS-Cov-2 infection and should not be used as the sole basis for treatment or other patient management decisions. A negative result may occur with  improper specimen collection/handling, submission of specimen other than nasopharyngeal swab, presence of viral mutation(s) within the areas targeted by this assay, and inadequate number of viral copies(<138 copies/mL). A negative result must be combined with clinical observations, patient  history, and epidemiological information. The expected result is Negative.  Fact Sheet for Patients:  EntrepreneurPulse.com.au  Fact Sheet for Healthcare Providers:  IncredibleEmployment.be  This test is no t yet approved or cleared by the [place] FDA and  has been authorized for detection and/or diagnosis of SARS-CoV-2 by FDA under an Emergency Use Authorization (EUA). This EUA will remain  in effect (meaning this test can be used) for the duration of the COVID-19 declaration under Section 564(b)(1) of the Act, 21 U.S.C.section 360bbb-3(b)(1), unless the authorization is terminated  or revoked sooner.       Influenza A by PCR NEGATIVE NEGATIVE Final   Influenza B by PCR NEGATIVE NEGATIVE Final    Comment: (NOTE) The Xpert Xpress SARS-CoV-2/FLU/RSV plus assay is intended as an aid in the diagnosis of influenza from Nasopharyngeal swab specimens and should not be used as a sole basis for treatment. Nasal washings and aspirates are unacceptable for Xpert Xpress SARS-CoV-2/FLU/RSV testing.  Fact Sheet for Patients: EntrepreneurPulse.com.au  Fact Sheet for Healthcare Providers: IncredibleEmployment.be  This test is not yet  approved or cleared by the [place] FDA and has been authorized for detection and/or diagnosis of SARS-CoV-2 by FDA under an Emergency Use Authorization (EUA). This EUA will remain in effect (meaning this test can be used) for the duration of the COVID-19 declaration under Section 564(b)(1) of the Act, 21 U.S.C. section 360bbb-3(b)(1), unless the authorization is terminated or revoked.  Performed at [facility], [place] [address] [place], [place] [address]   Urine Culture     Status: Abnormal   Collection Time: 05/13/21 10:45 PM   Specimen: Urine, Clean Catch  Result Value Ref Range Status   Specimen Description URINE, CLEAN CATCH  Final   Special Requests NONE  Final   Culture (A)  Final    <10,000 COLONIES/mL INSIGNIFICANT GROWTH Performed at [facility], [place] [address] [place], [place] [address]    Report Status 05/14/2021 FINAL  Final  MRSA Next Gen by PCR, Nasal     Status: Abnormal   Collection Time: 05/14/21  2:17 PM   Specimen: Nasal Mucosa; Nasal Swab  Result Value Ref Range Status   MRSA by PCR Next Gen DETECTED (A) NOT DETECTED Final    Comment: RESULT CALLED TO, READ BACK BY AND VERIFIED WITH: Marjory Sneddon RN [id number] 05/14/21 A BROWNING (NOTE) The GeneXpert MRSA Assay (FDA approved for NASAL specimens only), is one component of a comprehensive MRSA colonization surveillance program. It is not intended to diagnose MRSA infection nor to guide or monitor treatment for MRSA infections. Test performance is not FDA approved in patients less than 20 years old. Performed at [facility], [place] [address] [place], [place] [address]      Discharge Instructions:   Discharge Instructions     Diet general   Complete by: As directed    Discharge instructions   Complete by: As directed    Follow-up with your primary care provider at the skilled nursing facility in 3 to 5 days.  Try to avoid sedative-hypnotic medications if possible.  Try to avoid QT  prolonging agents as much as possible.  Patient has been taken off off flecainide.  Patient will have to follow-up with cardiology as an outpatient.   Increase activity slowly   Complete by: As directed    No wound care   Complete by: As directed       Allergies as of 05/16/2021  Reactions   Penicillins Itching, Swelling, Other (See Comments)   REACTION: itching and edema   Aspirin Nausea Only, Other (See Comments)   REACTION: GI upset  ( pt can take 81 mg but NOT   325 mg ASA )   Lovastatin Nausea Only, Other (See Comments)   REACTION: nausea   Benazepril Hcl Other (See Comments)   Unknown   Colesevelam Hcl Other (See Comments)   Doxycycline Hyclate Other (See Comments)   Febuxostat Other (See Comments)   Other reaction(s): angioedema   Hydrocodone-acetaminophen Other (See Comments)   Other reaction(s): irritable   Losartan Other (See Comments)   unknown   Paroxetine Other (See Comments)   Unknown   Statins Depletion [acid Blockers Support] Other (See Comments)   Tape Other (See Comments)   Valacyclovir Hcl Nausea And Vomiting   Bupropion Hcl Other (See Comments)   REACTION: tinnitis   Ezetimibe Nausea And Vomiting, Other (See Comments)   REACTION: GI symptoms   Fenofibrate Other (See Comments)   Myalgia   Pravastatin Sodium Other (See Comments)   REACTION: elevated CPK - Muscle's in bilateral Leg        Medication List     STOP taking these medications    flecainide 50 MG tablet Commonly known as: TAMBOCOR       TAKE these medications    acetaminophen 325 MG tablet Commonly known as: TYLENOL Take 2 tablets (650 mg total) by mouth every 6 (six) hours as needed for mild pain (or Fever >/= 101).   apixaban 2.5 MG Tabs tablet Commonly known as: Eliquis Take 1 tablet (2.5 mg total) by mouth 2 (two) times daily. APPOINTMENT NEEDED FOR FURTHER REFILLS. CONTACT YOUR CARDIOLOGIST What changed: additional instructions   B-complex with vitamin C  tablet Take 1 tablet by mouth daily after supper.   Ca Carbonate-Mag Hydroxide 400-135 MG/5ML Susp Take 5 mLs by mouth daily as needed (constipation).   cholecalciferol 25 MCG (1000 UNIT) tablet Commonly known as: VITAMIN D3 Take 1,000 Units by mouth daily.   cyclobenzaprine 5 MG tablet Commonly known as: FLEXERIL Take 1 tablet (5 mg total) by mouth every 12 (twelve) hours as needed for muscle spasms. What changed:  medication strength how much to take   docusate sodium 100 MG capsule Commonly known as: COLACE Take 1 capsule (100 mg total) by mouth 2 (two) times daily.   feeding supplement Liqd Take 237 mLs by mouth 2 (two) times daily between meals.   hydrALAZINE 25 MG tablet Commonly known as: APRESOLINE Take 1 tablet (25 mg total) by mouth every 8 (eight) hours as needed (if systolic bp more than 409 mmhg).   levothyroxine 100 MCG tablet Commonly known as: SYNTHROID Take 1 tablet (100 mcg total) by mouth daily before breakfast. Start taking on: May 17, 2021 What changed:  medication strength how much to take   magnesium 30 MG tablet Take 1 tablet (30 mg total) by mouth 2 (two) times daily for 10 days.   meclizine 25 MG tablet Commonly known as: ANTIVERT Take 1 tablet (25 mg total) by mouth daily. 1/2 every 8 hrs prn for imbalance What changed:  how much to take when to take this additional instructions   melatonin 3 MG Tabs tablet Take 1 tablet (3 mg total) by mouth at bedtime as needed (insomnia).   metoprolol succinate 100 MG 24 hr tablet Commonly known as: TOPROL-XL Take 1 tablet (100 mg total) by mouth daily. Take with or immediately following a meal.  polyethylene glycol 17 g packet Commonly known as: MIRALAX / GLYCOLAX Take 17 g by mouth daily as needed for mild constipation.   senna 8.6 MG tablet Commonly known as: SENOKOT Take 1 tablet by mouth every other day.   traMADol 50 MG tablet Commonly known as: ULTRAM Take 1 tablet (50 mg  total) by mouth every 8 (eight) hours as needed for up to 365 doses for moderate pain. What changed:  how much to take when to take this        Follow-up Information     [place], Mihai, MD .   Specialty: Cardiology Contact information: [address] [place] [address] [phone]                 Time coordinating discharge: 39 minutes  Signed:  Bulah Lurie  [facility] 05/16/2021, 9:56 AM

## 2021-05-18 DIAGNOSIS — R1084 Generalized abdominal pain: Secondary | ICD-10-CM | POA: Diagnosis not present

## 2021-05-18 DIAGNOSIS — R404 Transient alteration of awareness: Secondary | ICD-10-CM | POA: Diagnosis not present

## 2021-05-24 DIAGNOSIS — 419620001 Death: Secondary | SNOMED CT | POA: Diagnosis not present

## 2021-05-24 DEATH — deceased

## 2021-06-13 ENCOUNTER — Encounter: Payer: Self-pay | Admitting: Internal Medicine
